# Patient Record
Sex: Female | Born: 1940
Health system: Southern US, Community
[De-identification: ages and names within clinical notes are randomized; demographics above are authoritative.]

## PROBLEM LIST (undated history)

## (undated) ENCOUNTER — Inpatient Hospital Stay: Admission: EM | Payer: Self-pay | Source: Home / Self Care

## (undated) DIAGNOSIS — I2510 Atherosclerotic heart disease of native coronary artery without angina pectoris: Secondary | ICD-10-CM

## (undated) DIAGNOSIS — T7840XA Allergy, unspecified, initial encounter: Secondary | ICD-10-CM

## (undated) DIAGNOSIS — K449 Diaphragmatic hernia without obstruction or gangrene: Secondary | ICD-10-CM

## (undated) DIAGNOSIS — R918 Other nonspecific abnormal finding of lung field: Secondary | ICD-10-CM

## (undated) DIAGNOSIS — E785 Hyperlipidemia, unspecified: Secondary | ICD-10-CM

## (undated) DIAGNOSIS — T380X5A Adverse effect of glucocorticoids and synthetic analogues, initial encounter: Secondary | ICD-10-CM

## (undated) DIAGNOSIS — N183 Chronic kidney disease, stage 3 unspecified: Secondary | ICD-10-CM

## (undated) DIAGNOSIS — I701 Atherosclerosis of renal artery: Secondary | ICD-10-CM

## (undated) DIAGNOSIS — C4491 Basal cell carcinoma of skin, unspecified: Secondary | ICD-10-CM

## (undated) DIAGNOSIS — I10 Essential (primary) hypertension: Secondary | ICD-10-CM

## (undated) DIAGNOSIS — R911 Solitary pulmonary nodule: Secondary | ICD-10-CM

## (undated) DIAGNOSIS — K219 Gastro-esophageal reflux disease without esophagitis: Secondary | ICD-10-CM

## (undated) DIAGNOSIS — I5030 Unspecified diastolic (congestive) heart failure: Secondary | ICD-10-CM

## (undated) DIAGNOSIS — C3490 Malignant neoplasm of unspecified part of unspecified bronchus or lung: Secondary | ICD-10-CM

## (undated) DIAGNOSIS — J189 Pneumonia, unspecified organism: Secondary | ICD-10-CM

## (undated) DIAGNOSIS — N289 Disorder of kidney and ureter, unspecified: Secondary | ICD-10-CM

## (undated) DIAGNOSIS — E039 Hypothyroidism, unspecified: Secondary | ICD-10-CM

## (undated) DIAGNOSIS — E099 Drug or chemical induced diabetes mellitus without complications: Secondary | ICD-10-CM

## (undated) DIAGNOSIS — M1990 Unspecified osteoarthritis, unspecified site: Secondary | ICD-10-CM

## (undated) HISTORY — PX: SKIN CANCER EXCISION: SHX779

## (undated) HISTORY — DX: Hyperlipidemia, unspecified: E78.5

## (undated) HISTORY — DX: Unspecified diastolic (congestive) heart failure: I50.30

## (undated) HISTORY — DX: Diaphragmatic hernia without obstruction or gangrene: K44.9

## (undated) HISTORY — DX: Allergy, unspecified, initial encounter: T78.40XA

## (undated) HISTORY — DX: Atherosclerosis of renal artery: I70.1

## (undated) HISTORY — DX: Essential (primary) hypertension: I10

## (undated) HISTORY — DX: Unspecified osteoarthritis, unspecified site: M19.90

## (undated) HISTORY — DX: Hypothyroidism, unspecified: E03.9

## (undated) HISTORY — DX: Chronic kidney disease, stage 3 unspecified: N18.30

## (undated) HISTORY — DX: Drug or chemical induced diabetes mellitus without complications: E09.9

## (undated) HISTORY — PX: TONSILLECTOMY AND ADENOIDECTOMY: SUR1326

## (undated) HISTORY — PX: CORONARY ANGIOGRAM: SHX5786

## (undated) HISTORY — DX: Gastro-esophageal reflux disease without esophagitis: K21.9

## (undated) HISTORY — DX: Chronic kidney disease, stage 3 (moderate): N18.3

## (undated) HISTORY — PX: CARDIAC CATHETERIZATION: SHX172

## (undated) HISTORY — DX: Atherosclerotic heart disease of native coronary artery without angina pectoris: I25.10

## (undated) HISTORY — PX: KNEE ARTHROSCOPY: SUR90

## (undated) HISTORY — DX: Adverse effect of glucocorticoids and synthetic analogues, initial encounter: T38.0X5A

## (undated) HISTORY — DX: Disorder of kidney and ureter, unspecified: N28.9

---

## 1959-07-06 HISTORY — PX: DILATION AND CURETTAGE OF UTERUS: SHX78

## 1968-11-04 HISTORY — PX: VAGINAL HYSTERECTOMY: SUR661

## 1969-07-05 HISTORY — PX: CHOLECYSTECTOMY: SHX55

## 1998-11-04 HISTORY — PX: ANTERIOR CERVICAL DECOMP/DISCECTOMY FUSION: SHX1161

## 2000-07-01 ENCOUNTER — Emergency Department (HOSPITAL_COMMUNITY): Admission: EM | Admit: 2000-07-01 | Discharge: 2000-07-01 | Payer: Self-pay | Admitting: Emergency Medicine

## 2000-07-01 ENCOUNTER — Encounter: Payer: Self-pay | Admitting: Family Medicine

## 2001-07-17 ENCOUNTER — Ambulatory Visit (HOSPITAL_COMMUNITY): Admission: RE | Admit: 2001-07-17 | Discharge: 2001-07-17 | Payer: Self-pay | Admitting: Neurological Surgery

## 2001-07-17 ENCOUNTER — Encounter: Payer: Self-pay | Admitting: Neurological Surgery

## 2002-03-27 ENCOUNTER — Inpatient Hospital Stay (HOSPITAL_COMMUNITY): Admission: EM | Admit: 2002-03-27 | Discharge: 2002-03-28 | Payer: Self-pay | Admitting: Emergency Medicine

## 2004-04-20 ENCOUNTER — Ambulatory Visit (HOSPITAL_COMMUNITY): Admission: RE | Admit: 2004-04-20 | Discharge: 2004-04-20 | Payer: Self-pay | Admitting: Gastroenterology

## 2005-04-18 ENCOUNTER — Inpatient Hospital Stay (HOSPITAL_BASED_OUTPATIENT_CLINIC_OR_DEPARTMENT_OTHER): Admission: RE | Admit: 2005-04-18 | Discharge: 2005-04-18 | Payer: Self-pay | Admitting: Cardiology

## 2008-01-28 ENCOUNTER — Encounter: Payer: Self-pay | Admitting: Family Medicine

## 2009-03-23 ENCOUNTER — Ambulatory Visit: Payer: Self-pay | Admitting: Family Medicine

## 2009-03-23 DIAGNOSIS — E039 Hypothyroidism, unspecified: Secondary | ICD-10-CM

## 2009-03-23 DIAGNOSIS — I10 Essential (primary) hypertension: Secondary | ICD-10-CM

## 2009-03-23 DIAGNOSIS — J45909 Unspecified asthma, uncomplicated: Secondary | ICD-10-CM | POA: Insufficient documentation

## 2009-03-23 DIAGNOSIS — Z860100 Personal history of colon polyps, unspecified: Secondary | ICD-10-CM | POA: Insufficient documentation

## 2009-03-23 DIAGNOSIS — K219 Gastro-esophageal reflux disease without esophagitis: Secondary | ICD-10-CM | POA: Insufficient documentation

## 2009-04-20 ENCOUNTER — Ambulatory Visit: Payer: Self-pay | Admitting: Family Medicine

## 2009-05-19 DIAGNOSIS — K259 Gastric ulcer, unspecified as acute or chronic, without hemorrhage or perforation: Secondary | ICD-10-CM | POA: Insufficient documentation

## 2009-05-19 DIAGNOSIS — K209 Esophagitis, unspecified without bleeding: Secondary | ICD-10-CM | POA: Insufficient documentation

## 2009-05-19 DIAGNOSIS — K449 Diaphragmatic hernia without obstruction or gangrene: Secondary | ICD-10-CM | POA: Insufficient documentation

## 2009-05-19 DIAGNOSIS — M170 Bilateral primary osteoarthritis of knee: Secondary | ICD-10-CM

## 2009-06-28 ENCOUNTER — Ambulatory Visit: Payer: Self-pay | Admitting: Family Medicine

## 2009-09-18 ENCOUNTER — Encounter (INDEPENDENT_AMBULATORY_CARE_PROVIDER_SITE_OTHER): Payer: Self-pay | Admitting: *Deleted

## 2009-09-18 ENCOUNTER — Ambulatory Visit: Payer: Self-pay | Admitting: Family Medicine

## 2009-09-18 DIAGNOSIS — L989 Disorder of the skin and subcutaneous tissue, unspecified: Secondary | ICD-10-CM | POA: Insufficient documentation

## 2009-09-20 LAB — CONVERTED CEMR LAB
BUN: 27 mg/dL — ABNORMAL HIGH (ref 6–23)
CO2: 30 meq/L (ref 19–32)
Calcium: 9.2 mg/dL (ref 8.4–10.5)
Chloride: 104 meq/L (ref 96–112)
Creatinine, Ser: 1 mg/dL (ref 0.4–1.2)
GFR calc non Af Amer: 58.11 mL/min (ref >60)
Glucose, Bld: 102 mg/dL — ABNORMAL HIGH (ref 70–99)
Potassium: 4.7 meq/L (ref 3.5–5.1)
Sodium: 143 meq/L (ref 135–145)
TSH: 1.83 microintl units/mL (ref 0.35–5.50)

## 2009-09-22 ENCOUNTER — Telehealth: Payer: Self-pay | Admitting: Family Medicine

## 2009-09-22 DIAGNOSIS — C4490 Unspecified malignant neoplasm of skin, unspecified: Secondary | ICD-10-CM

## 2009-11-10 ENCOUNTER — Ambulatory Visit: Payer: Self-pay | Admitting: Family Medicine

## 2009-11-10 DIAGNOSIS — M25569 Pain in unspecified knee: Secondary | ICD-10-CM

## 2009-12-06 ENCOUNTER — Encounter: Payer: Self-pay | Admitting: Family Medicine

## 2009-12-06 LAB — HM MAMMOGRAPHY: HM Mammogram: NORMAL

## 2009-12-08 ENCOUNTER — Encounter: Payer: Self-pay | Admitting: Family Medicine

## 2010-01-15 ENCOUNTER — Ambulatory Visit: Payer: Self-pay | Admitting: Family Medicine

## 2010-01-15 DIAGNOSIS — M810 Age-related osteoporosis without current pathological fracture: Secondary | ICD-10-CM | POA: Insufficient documentation

## 2010-02-12 ENCOUNTER — Ambulatory Visit: Payer: Self-pay | Admitting: Family Medicine

## 2010-06-04 ENCOUNTER — Ambulatory Visit: Payer: Self-pay | Admitting: Family Medicine

## 2010-06-04 DIAGNOSIS — R609 Edema, unspecified: Secondary | ICD-10-CM | POA: Insufficient documentation

## 2010-06-29 ENCOUNTER — Ambulatory Visit: Payer: Self-pay | Admitting: Family Medicine

## 2010-07-30 ENCOUNTER — Encounter: Payer: Self-pay | Admitting: Family Medicine

## 2010-11-16 ENCOUNTER — Telehealth (INDEPENDENT_AMBULATORY_CARE_PROVIDER_SITE_OTHER): Payer: Self-pay | Admitting: *Deleted

## 2010-12-02 LAB — CONVERTED CEMR LAB
ALT: 19 units/L (ref 0–35)
AST: 21 units/L (ref 0–37)
Albumin: 4 g/dL (ref 3.5–5.2)
Alkaline Phosphatase: 101 units/L (ref 39–117)
BUN: 21 mg/dL (ref 6–23)
Basophils Absolute: 0 10*3/uL (ref 0.0–0.1)
Basophils Relative: 0.6 % (ref 0.0–3.0)
Bilirubin, Direct: 0.2 mg/dL (ref 0.0–0.3)
CO2: 28 meq/L (ref 19–32)
Calcium: 9 mg/dL (ref 8.4–10.5)
Chloride: 108 meq/L (ref 96–112)
Cholesterol: 195 mg/dL (ref 0–200)
Creatinine, Ser: 1 mg/dL (ref 0.4–1.2)
Eosinophils Absolute: 0.3 10*3/uL (ref 0.0–0.7)
Eosinophils Relative: 3.6 % (ref 0.0–5.0)
GFR calc non Af Amer: 57.99 mL/min (ref >60)
Glucose, Bld: 99 mg/dL (ref 70–99)
HCT: 40.8 % (ref 36.0–46.0)
HDL: 53.1 mg/dL (ref >39.00)
Hemoglobin: 13.8 g/dL (ref 12.0–15.0)
LDL Cholesterol: 114 mg/dL — ABNORMAL HIGH (ref 0–99)
Lymphocytes Relative: 24.5 % (ref 12.0–46.0)
Lymphs Abs: 1.9 10*3/uL (ref 0.7–4.0)
MCHC: 33.7 g/dL (ref 30.0–36.0)
MCV: 87.1 fL (ref 78.0–100.0)
Monocytes Absolute: 0.4 10*3/uL (ref 0.1–1.0)
Monocytes Relative: 5.3 % (ref 3.0–12.0)
Neutro Abs: 5.1 10*3/uL (ref 1.4–7.7)
Neutrophils Relative %: 66 % (ref 43.0–77.0)
Platelets: 267 10*3/uL (ref 150.0–400.0)
Potassium: 4.4 meq/L (ref 3.5–5.1)
RBC: 4.69 M/uL (ref 3.87–5.11)
RDW: 15.6 % — ABNORMAL HIGH (ref 11.5–14.6)
Sodium: 144 meq/L (ref 135–145)
TSH: 2.1 microintl units/mL (ref 0.35–5.50)
Total Bilirubin: 0.9 mg/dL (ref 0.3–1.2)
Total CHOL/HDL Ratio: 4
Total Protein: 6.9 g/dL (ref 6.0–8.3)
Triglycerides: 141 mg/dL (ref 0.0–149.0)
VLDL: 28.2 mg/dL (ref 0.0–40.0)
Vit D, 25-Hydroxy: 35 ng/mL (ref 30–89)
WBC: 7.7 10*3/uL (ref 4.5–10.5)

## 2010-12-06 NOTE — Assessment & Plan Note (Signed)
Summary: CPX/FASTING//KN   Vital Signs:  Patient profile:   70 year old female Height:      63.50 inches Weight:      250 pounds BMI:     43.75 Pulse rate:   62 / minute BP sitting:   136 / 80  (left arm)  Vitals Entered By: Doristine Devoid CMA (June 29, 2010 8:38 AM) CC: CPX AND LABS    History of Present Illness: 70 yo woman here today for CPE.  Here for Medicare AWV:  1.   Risk factors based on Past M, S, F history: HTN- BP adequately controlled on Amlodipine, Losartan, Carvedilol.  no CP, SOB, HAs, visual changes.  + edema Hypothyroid- on Levothroid, denies heat/cold intolerance, alopecia, brittle nails GERD- on Prevacid.  sxs well controlled. Edema- out of town for the last 3 days, had pizza and multiple meals out.  improved w/ elevation and lasix 2.   Physical Activities: walking regularly 3.   Depression/mood: deniess depressive sxs 4.   Hearing: normal to whispered voice 5.   ADL's: independent 6.   Fall Risk: denies concern, no risk identified in office 7.   Home Safety: safe at home, husband present 8.   Height, weight, &visual acuity: see vitals, corrected to 20/20 w/ glasses 9.   Counseling: provided on healthy diet and regular exerise 10.   Labs ordered based on risk factors: see A&P 11.           Referral Coordination: UTD on colonoscopy, mammogram, bone density.  no pap in 3-4 yrs- s/p hysterectomy.  Ovaries remain. 12.           Care Plan:  see A&P 13.           Cognitive Assessment- normal thought process, conversation.  able to pay bills and balance checkbook w/out assistance.  AAOx3, 3/3 object recall, W-O-R-L-D  Preventive Screening-Counseling & Management  Alcohol-Tobacco     Alcohol drinks/day: <1     Smoking Status: never  Caffeine-Diet-Exercise     Does Patient Exercise: yes     Type of exercise: walking, water aerobics      Sexual History:  currently monogamous.        Drug Use:  never.    Current Medications (verified): 1)  Amlodipine  Besylate 10 Mg Tabs (Amlodipine Besylate) .... One By Mouth Once Daily 2)  Carvedilol 12.5 Mg Tabs (Carvedilol) .... One By Mouth Two Times A Day 3)  Levothroid 75 Mcg Tabs (Levothyroxine Sodium) .... Once Daily 4)  Singulair 10 Mg Tabs (Montelukast Sodium) .... As Needed 5)  Furosemide 20 Mg Tabs (Furosemide) .... Once Daily 6)  Losartan Potassium 100 Mg Tabs (Losartan Potassium) .... One By Mouth Once Daily 7)  Prevacid Solutab 15 Mg Tbdp (Lansoprazole) .... Once Daily 8)  Fluticasone Propionate 50 Mcg/act Susp (Fluticasone Propionate) .... Once Daily 9)  Zyrtec Allergy 10 Mg Tabs (Cetirizine Hcl) .... Once Daily 10)  Multi For Her 50+  Tabs (Multiple Vitamins-Minerals) .... Once Daily 11)  Advair Diskus 100-50 Mcg/dose Misc (Fluticasone-Salmeterol) .... One Puff in Am and Pm 12)  Calcium 600-200 Mg-Unit Tabs (Calcium-Vitamin D) .... Take One Tablet Daily 13)  Etodolac 400 Mg Tabs (Etodolac) .... 2 Tablets in Am and Pm  Allergies (verified): 1)  ! Sulphur-Heel (Homeopathic Products) 2)  ! Claritin (Loratadine) 3)  ! Celebrex (Celecoxib) 4)  ! Clinoril (Sulindac) 5)  ! Lisinopril (Lisinopril) 6)  ! Lotensin (Benazepril Hcl)  Past History:  Past Medical History: Last  updated: 06/28/2009 Asthma Colonic polyps, hx of GERD Hypertension Hay fever/Allergies Arthritis Hypothyroid Osteoporosis DJD gastric ulcer esophagitis hiatal hernia  Past Surgical History: Last updated: 06/04/2010 Rt knee arthroscopy 1995 Left knee  "       "       2003 Neck surgery 2000 Cholecystectomy  1978 Hysterectomy  1970--bleeding Child birth x 3 fusion C5-C6  2000  Family History: Family History of Arthritis Family History High cholesterol Family History Hypertension Diabetes, parent, grandparent Stroke parent, grandparent Heart disease, parent, grandparent sister w/ breast cancer colon cancer- no  Social History: Retired- owned a Physicist, medical Married Never Smoked Alcohol  use-no Does Patient Exercise:  yes Sexual History:  currently monogamous Drug Use:  never  Review of Systems       The patient complains of peripheral edema.  The patient denies anorexia, fever, weight loss, weight gain, vision loss, decreased hearing, hoarseness, chest pain, syncope, dyspnea on exertion, prolonged cough, headaches, abdominal pain, melena, hematochezia, severe indigestion/heartburn, hematuria, suspicious skin lesions, depression, abnormal bleeding, enlarged lymph nodes, and breast masses.    Physical Exam  General:  Well-developed,well-nourished,in no acute distress; alert,appropriate and cooperative throughout examination Head:  Normocephalic and atraumatic without obvious abnormalities. No apparent alopecia or balding. Eyes:  No corneal or conjunctival inflammation noted. EOMI. Perrla. Funduscopic exam benign, without hemorrhages, exudates or papilledema. Vision grossly normal. Ears:  External ear exam shows no significant lesions or deformities.  Otoscopic examination reveals clear canals, tympanic membranes are intact bilaterally without bulging, retraction, inflammation or discharge. Hearing is grossly normal bilaterally. Nose:  External nasal examination shows no deformity or inflammation. Nasal mucosa are pink and moist without lesions or exudates. Mouth:  Oral mucosa and oropharynx without lesions or exudates.  Teeth in good repair. Neck:  No deformities, masses, or tenderness noted. Breasts:  No mass, nodules, thickening, tenderness, bulging, retraction, inflamation, nipple discharge or skin changes noted.   Lungs:  Normal respiratory effort, chest expands symmetrically. Lungs are clear to auscultation, no crackles or wheezes. Heart:  Normal rate and regular rhythm. S1 and S2 normal without gallop, murmur, click, rub or other extra sounds. Abdomen:  Bowel sounds positive,abdomen soft and non-tender without masses, organomegaly or hernias noted. Genitalia:  normal  introitus, no external lesions, no vaginal discharge, mucosa pink and moist, and no adnexal masses or tenderness.   Pulses:  +2 carotid, radial, DP/PT Extremities:  No clubbing, cyanosis, edema, or deformity noted with normal full range of motion of all joints.   Neurologic:  No cranial nerve deficits noted. Station and gait are normal. Plantar reflexes are down-going bilaterally. DTRs are symmetrical throughout. Sensory, motor and coordinative functions appear intact. Skin:  Intact without suspicious lesions or rashes Cervical Nodes:  No lymphadenopathy noted Axillary Nodes:  No palpable lymphadenopathy Psych:  Cognition and judgment appear intact. Alert and cooperative with normal attention span and concentration. No apparent delusions, illusions, hallucinations   Impression & Recommendations:  Problem # 1:  PHYSICAL EXAMINATION (ICD-V70.0) Assessment New pt's PE WNL.  check labs.  UTD on health maintainence.  anticipatory guidance provided. Orders: Specimen Handling (44034) TLB-Lipid Panel (80061-LIPID) TLB-Hepatic/Liver Function Pnl (80076-HEPATIC) Medicare -1st Annual Wellness Visit [phone])  Problem # 2:  HYPERTENSION (ICD-401.9) Assessment: Unchanged adequate control on current meds.  asymptomatic.  no changes Her updated medication list for this problem includes:    Amlodipine Besylate 10 Mg Tabs (Amlodipine besylate) ..... One by mouth once daily    Carvedilol 12.5 Mg Tabs (Carvedilol) ..... One by mouth  two times a day    Furosemide 20 Mg Tabs (Furosemide) ..... Once daily    Losartan Potassium 100 Mg Tabs (Losartan potassium) ..... One by mouth once daily  Orders: TLB-BMP (Basic Metabolic Panel-BMET) (80048-METABOL) TLB-CBC Platelet - w/Differential (85025-CBCD)  Problem # 3:  HYPOTHYROIDISM (ICD-244.9) Assessment: Unchanged asymptomatic.  check labs.  adjust meds as needed. Her updated medication list for this problem includes:    Levothroid 75 Mcg Tabs (Levothyroxine  sodium) ..... Once daily  Orders: Venipuncture (16109) Specimen Handling (60454) TLB-TSH (Thyroid Stimulating Hormone) (84443-TSH)  Problem # 4:  GERD (ICD-530.81) Assessment: Unchanged sxs adquately controlled. Her updated medication list for this problem includes:    Prevacid Solutab 15 Mg Tbdp (Lansoprazole) ..... Once daily  Problem # 5:  EDEMA- LOCALIZED (ICD-782.3) Assessment: Unchanged again reviewed the importance of low sodium diet, especially when eating out/traveling. Her updated medication list for this problem includes:    Furosemide 20 Mg Tabs (Furosemide) ..... Once daily  Complete Medication List: 1)  Amlodipine Besylate 10 Mg Tabs (Amlodipine besylate) .... One by mouth once daily 2)  Carvedilol 12.5 Mg Tabs (Carvedilol) .... One by mouth two times a day 3)  Levothroid 75 Mcg Tabs (Levothyroxine sodium) .... Once daily 4)  Singulair 10 Mg Tabs (Montelukast sodium) .... As needed 5)  Furosemide 20 Mg Tabs (Furosemide) .... Once daily 6)  Losartan Potassium 100 Mg Tabs (Losartan potassium) .... One by mouth once daily 7)  Prevacid Solutab 15 Mg Tbdp (Lansoprazole) .... Once daily 8)  Fluticasone Propionate 50 Mcg/act Susp (Fluticasone propionate) .... Once daily 9)  Zyrtec Allergy 10 Mg Tabs (Cetirizine hcl) .... Once daily 10)  Multi For Her 50+ Tabs (Multiple vitamins-minerals) .... Once daily 11)  Advair Diskus 100-50 Mcg/dose Misc (Fluticasone-salmeterol) .... One puff in am and pm 12)  Calcium 600-200 Mg-unit Tabs (Calcium-vitamin d) .... Take one tablet daily 13)  Etodolac 400 Mg Tabs (Etodolac) .... 2 tablets in am and pm  Other Orders: T-Vitamin D (25-Hydroxy) [id number]) Prescription Created Electronically [id number])  Patient Instructions: 1)  Follow up in 6 months- sooner if needed 2)  We'll notify you of your lab results 3)  Call with any questions or concerns 4)  Keep up the good work on exercise and continue to make healthy food choices 5)  Your  exam looks great! 6)  Happy Labor Day! Prescriptions: ETODOLAC 400 MG TABS (ETODOLAC) 2 tablets in am and pm  #120 x 3   Entered and Authorized by:   Neena Rhymes MD   Signed by:   Neena Rhymes MD on 06/29/2010   Method used:   Electronically to        [facility]* (retail)       [address] [place], [place]  [address]       Ph: [phone]       Fax: [phone]   RxID:   [id number] ADVAIR DISKUS 100-50 MCG/DOSE MISC (FLUTICASONE-SALMETEROL) one puff in am and pm  #3 x 3   Entered and Authorized by:   Neena Rhymes MD   Signed by:   Neena Rhymes MD on 06/29/2010   Method used:   Electronically to        [facility]* (retail)       [address] [place], [place]  [address]  Ph: [phone]       Fax: [phone]   RxID:   [id number] FLUTICASONE PROPIONATE 50 MCG/ACT SUSP (FLUTICASONE PROPIONATE) once daily  #3 x 3   Entered and Authorized by:   Neena Rhymes MD   Signed by:   Neena Rhymes MD on 06/29/2010   Method used:   Electronically to        [facility]* (retail)       [address] [place], [place]  [address]       Ph: [phone]       Fax: [phone]   RxID:   [id number]

## 2010-12-06 NOTE — Assessment & Plan Note (Signed)
Summary: new to estab/cbs   Vital Signs:  Patient profile:   70 year old female Weight:      247 pounds Pulse rate:   68 / minute BP sitting:   120 / 78  (left arm)  Vitals Entered By: Doristine Devoid CMA (June 04, 2010 10:32 AM) CC: new est- problems w/ arthritis med and feet swelling while on vacation   History of Present Illness: 70 yo woman here today to establish care.  previously a pt of Dr Caryl Never.  'it was time for a change'.  1) Arthritis- c/o hands, knees, feet, back.  saw Dr Eulah Pont this winter.  there has been talk of knee replacement.  currently on Tramadol- taking 2 in the AM provides relief of back pain.  has had knee injections in the past.  is considering SynVisc.  no longer on the Etodolac b/c of elevated BP.    2) Edema- occurred while traveling.  'they were swollen the whole time'.  hx of similar while traveling.  resolves when she returns home.  often eats at restaurants while traveling plus air travel.  3) HTN- well controlled on current meds.  checks BPs at home w/ results similar to those in office.  on Carvedilol, amlodipine, losartan, and Furosemide.  no CP, SOB, HAs, visual changes.  4) Hypothyroid- maintainted on Levothyroxine.  last labs in 11/10.  5) GERD- taking Prevacid w/ good sxs relief.  also modifying diet.  Problems Prior to Update: 1)  Osteopenia  (ICD-733.90) 2)  Knee Pain  (ICD-719.46) 3)  Carcinoma, Basal Cell  (ICD-173.9) 4)  Skin Lesion  (ICD-709.9) 5)  Hiatal Hernia  (ICD-553.3) 6)  Esophagitis  (ICD-530.10) 7)  Gastric Ulcer  (ICD-531.90) 8)  Degenerative Joint Disease  (ICD-715.90) 9)  Hypothyroidism  (ICD-244.9) 10)  Hypertension  (ICD-401.9) 11)  Gerd  (ICD-530.81) 12)  Colonic Polyps, Hx of  (ICD-V12.72) 13)  Asthma  (ICD-493.90)  Current Medications (verified): 1)  Amlodipine Besylate 10 Mg Tabs (Amlodipine Besylate) .... One By Mouth Once Daily 2)  Carvedilol 12.5 Mg Tabs (Carvedilol) .... One By Mouth Two Times A Day 3)   Levothroid 75 Mcg Tabs (Levothyroxine Sodium) .... Once Daily 4)  Singulair 10 Mg Tabs (Montelukast Sodium) .... As Needed 5)  Furosemide 20 Mg Tabs (Furosemide) .... Once Daily 6)  Losartan Potassium 100 Mg Tabs (Losartan Potassium) .... One By Mouth Once Daily 7)  Prevacid Solutab 15 Mg Tbdp (Lansoprazole) .... Once Daily 8)  Fluticasone Propionate 50 Mcg/act Susp (Fluticasone Propionate) .... Once Daily 9)  Zyrtec Allergy 10 Mg Tabs (Cetirizine Hcl) .... Once Daily 10)  Multi For Her 50+  Tabs (Multiple Vitamins-Minerals) .... Once Daily 11)  Advair Diskus 100-50 Mcg/dose Misc (Fluticasone-Salmeterol) .... One Puff in Am and Pm 12)  Adult Aspirin Ec Low Strength 81 Mg Tbec (Aspirin) .... Once Daily 13)  Tramadol Hcl 50 Mg Tabs (Tramadol Hcl) .... Take 2 Tablets in Am and 1 Tablet in Pm 14)  Calcium 600-200 Mg-Unit Tabs (Calcium-Vitamin D) .... Take One Tablet Daily  Allergies (verified): 1)  ! Sulphur-Heel (Homeopathic Products) 2)  ! Claritin (Loratadine) 3)  ! Celebrex (Celecoxib) 4)  ! Clinoril (Sulindac) 5)  ! Lisinopril (Lisinopril) 6)  ! Lotensin (Benazepril Hcl)  Past History:  Past Medical History: Reviewed history from 06/28/2009 and no changes required. Asthma Colonic polyps, hx of GERD Hypertension Hay fever/Allergies Arthritis Hypothyroid Osteoporosis DJD gastric ulcer esophagitis hiatal hernia  Past Surgical History: Rt knee arthroscopy 1995 Left knee  "       "  2003 Neck surgery 2000 Cholecystectomy  1978 Hysterectomy  1970--bleeding Child birth x 3 fusion C5-C6  2000  Review of Systems      See HPI  Physical Exam  General:  Well-developed,well-nourished,in no acute distress; alert,appropriate and cooperative throughout examination Head:  Normocephalic and atraumatic without obvious abnormalities. No apparent alopecia or balding. Neck:  No deformities, masses, or tenderness noted. Lungs:  Normal respiratory effort, chest expands  symmetrically. Lungs are clear to auscultation, no crackles or wheezes. Heart:  Normal rate and regular rhythm. S1 and S2 normal without gallop, murmur, click, rub or other extra sounds. Abdomen:  soft, NT/ND, +BS Msk:  + TTP along knee joint line bilaterally + crepitus w/ flexion/extension bilaterally Pulses:  +2 DP/PT Extremities:  no C/C/E   Impression & Recommendations:  Problem # 1:  DEGENERATIVE JOINT DISEASE (ICD-715.90) Assessment Unchanged pt w/ increased pain. felt Etodolac worked better than Tramadol.  will restart NSAIDs.  pt to call ortho and discuss possible knee injxn w/ Dr Eulah Pont.  stop Ultram. The following medications were removed from the medication list:    Etodolac 400 Mg Tabs (Etodolac) ..... One by mouth two times a day    Hydrocodone-acetaminophen 5-325 Mg Tabs (Hydrocodone-acetaminophen) .Marland Kitchen... 1-2 by mouth q 4-6 hours as needed pain Her updated medication list for this problem includes:    Adult Aspirin Ec Low Strength 81 Mg Tbec (Aspirin) ..... Once daily    Tramadol Hcl 50 Mg Tabs (Tramadol hcl) .Marland Kitchen... Take 2 tablets in am and 1 tablet in pm  Problem # 2:  EDEMA- LOCALIZED (ICD-782.3) Assessment: New resolved today.  reviewed importance of low sodium diet, especially while traveling.  reviewed supportive care. Her updated medication list for this problem includes:    Furosemide 20 Mg Tabs (Furosemide) ..... Once daily  Problem # 3:  HYPERTENSION (ICD-401.9) Assessment: Unchanged well controlled.  aysmptomatic.  cont current meds. Her updated medication list for this problem includes:    Amlodipine Besylate 10 Mg Tabs (Amlodipine besylate) ..... One by mouth once daily    Carvedilol 12.5 Mg Tabs (Carvedilol) ..... One by mouth two times a day    Furosemide 20 Mg Tabs (Furosemide) ..... Once daily    Losartan Potassium 100 Mg Tabs (Losartan potassium) ..... One by mouth once daily  Problem # 4:  HYPOTHYROIDISM (ICD-244.9) Assessment: Unchanged due for  labs this fall.  will follow. Her updated medication list for this problem includes:    Levothroid 75 Mcg Tabs (Levothyroxine sodium) ..... Once daily  Problem # 5:  GERD (ICD-530.81) Assessment: Unchanged well controlled on prevacid and dietary modifications. Her updated medication list for this problem includes:    Prevacid Solutab 15 Mg Tbdp (Lansoprazole) ..... Once daily  Complete Medication List: 1)  Amlodipine Besylate 10 Mg Tabs (Amlodipine besylate) .... One by mouth once daily 2)  Carvedilol 12.5 Mg Tabs (Carvedilol) .... One by mouth two times a day 3)  Levothroid 75 Mcg Tabs (Levothyroxine sodium) .... Once daily 4)  Singulair 10 Mg Tabs (Montelukast sodium) .... As needed 5)  Furosemide 20 Mg Tabs (Furosemide) .... Once daily 6)  Losartan Potassium 100 Mg Tabs (Losartan potassium) .... One by mouth once daily 7)  Prevacid Solutab 15 Mg Tbdp (Lansoprazole) .... Once daily 8)  Fluticasone Propionate 50 Mcg/act Susp (Fluticasone propionate) .... Once daily 9)  Zyrtec Allergy 10 Mg Tabs (Cetirizine hcl) .... Once daily 10)  Multi For Her 50+ Tabs (Multiple vitamins-minerals) .... Once daily 11)  Advair Diskus 100-50 Mcg/dose  Misc (Fluticasone-salmeterol) .... One puff in am and pm 12)  Adult Aspirin Ec Low Strength 81 Mg Tbec (Aspirin) .... Once daily 13)  Tramadol Hcl 50 Mg Tabs (Tramadol hcl) .... Take 2 tablets in am and 1 tablet in pm 14)  Calcium 600-200 Mg-unit Tabs (Calcium-vitamin d) .... Take one tablet daily  Patient Instructions: 1)  Please schedule your physical in the next 3-4 weeks, don't eat before this appt 2)  STOP the tramadol 3)  RESTART the Etodolac two times a day- take w/ food to avoid upset stomach 4)  Double the furosemide dose as needed for swelling 5)  Elevate your legs as much as possible and limit your salt intake 6)  Call Dr Eulah Pont to discuss your knee injections 7)  Call with any questions or concerns 8)  Welcome!  We're glad to have you!

## 2010-12-06 NOTE — Assessment & Plan Note (Signed)
Summary: left knee pain/can't put any weight on it/cjr   Vital Signs:  Patient profile:   70 year old female Temp:     98.4 degrees F BP sitting:   162 / 90  (left arm) Cuff size:   large  Vitals Entered By: Sid Falcon LPN (November 10, 2009 3:00 PM) CC: left knee pain, no known injury Pain Assessment Patient in pain? yes     Location: knee Intensity: 10 Type: sharp Onset of pain  intensified yesterday, when walking #10 pain   History of Present Illness: Acute visit for left knee pain. Started yesterday. No known injury. Does have known degenerative joint disease. Takes etodolac 400 mg twice daily on a regular basis. Remote history of arthroscopy knee several years ago. Describes sharp pain mostly medial aspect left knee occasionally radiates posterior. Mild swelling. Increased night pain. No relief with current medications. Exacerbated with bending the knee and weightbearing. No warmth or erythema.  Allergies: 1)  ! Sulphur-Heel (Homeopathic Products) 2)  ! Claritin (Loratadine) 3)  ! Celebrex (Celecoxib) 4)  ! Clinoril (Sulindac) 5)  ! Lisinopril (Lisinopril) 6)  ! Lotensin (Benazepril Hcl)  Past History:  Past Medical History: Last updated: 06/28/2009 Asthma Colonic polyps, hx of GERD Hypertension Hay fever/Allergies Arthritis Hypothyroid Osteoporosis DJD gastric ulcer esophagitis hiatal hernia  Review of Systems      See HPI  Physical Exam  General:  Well-developed,well-nourished,in no acute distress; alert,appropriate and cooperative throughout examination Extremities:  left knee refills no warmth or erythema compared to right. No obvious effusion. Medial joint line tenderness. No lateral joint line tenderness. No popliteal swelling. Full range of motion knee. Cruciate ligament and collateral ligament testing is stable.   Impression & Recommendations:  Problem # 1:  KNEE PAIN (ICD-719.46) known degenerative disease. Question meniscus origin of  current pain. Add pain medication for symptom relief and continue anti-inflammatory. Consider x-rays the next week if no better. Her updated medication list for this problem includes:    Etodolac 400 Mg Tabs (Etodolac) ..... One by mouth two times a day    Adult Aspirin Ec Low Strength 81 Mg Tbec (Aspirin) ..... Once daily    Hydrocodone-acetaminophen 5-325 Mg Tabs (Hydrocodone-acetaminophen) .Marland Kitchen... 1-2 by mouth q 4-6 hours as needed pain  Complete Medication List: 1)  Etodolac 400 Mg Tabs (Etodolac) .... One by mouth two times a day 2)  Amlodipine Besylate 5 Mg Tabs (Amlodipine besylate) .... Once daily 3)  Carvedilol 12.5 Mg Tabs (Carvedilol) .... One by mouth two times a day 4)  Levothroid 75 Mcg Tabs (Levothyroxine sodium) .... Once daily 5)  Singulair 10 Mg Tabs (Montelukast sodium) .... As needed 6)  Furosemide 20 Mg Tabs (Furosemide) .... Once daily 7)  Benicar 40 Mg Tabs (Olmesartan medoxomil) .... One by mouth once daily 8)  Prevacid Solutab 15 Mg Tbdp (Lansoprazole) .... Once daily 9)  Fluticasone Propionate 50 Mcg/act Susp (Fluticasone propionate) .... Once daily 10)  Zyrtec Allergy 10 Mg Tabs (Cetirizine hcl) .... Once daily 11)  Multi For Her 50+ Tabs (Multiple vitamins-minerals) .... Once daily 12)  Advair Diskus 100-50 Mcg/dose Misc (Fluticasone-salmeterol) .... One puff in am and pm 13)  Adult Aspirin Ec Low Strength 81 Mg Tbec (Aspirin) .... Once daily 14)  Hydrocodone-acetaminophen 5-325 Mg Tabs (Hydrocodone-acetaminophen) .Marland Kitchen.. 1-2 by mouth q 4-6 hours as needed pain  Patient Instructions: 1)  Continue ice for symptomatic relief. 2)  Be in touch by next week if pain not improved. Prescriptions: HYDROCODONE-ACETAMINOPHEN 5-325 MG  TABS (HYDROCODONE-ACETAMINOPHEN) 1-2 by mouth q 4-6 hours as needed pain  #40 x 0   Entered and Authorized by:   Evelena Peat MD   Signed by:   Evelena Peat MD on 11/10/2009   Method used:   Print then Give to Patient   RxID:    [id number]

## 2010-12-06 NOTE — Assessment & Plan Note (Signed)
Summary: 1 month rov/njr   Vital Signs:  Patient profile:   70 year old female Weight:      253 pounds Temp:     98.2 degrees F oral BP sitting:   160 / 88  (left arm) Cuff size:   large  Vitals Entered By: Sid Falcon LPN (February 12, 2010 1:18 PM)  Serial Vital Signs/Assessments:  Time      Position  BP       Pulse  Resp  Temp     By                     150/78                         Evelena Peat MD  CC: 1 month follow-up   History of Present Illness: Followup hypertension. Increased amlodipine to 10 mg daily last visit. Initially some lightheadedness but those symptoms have resolved. Denies any palpitations or chest pains. No orthostatic symptoms now. compliant with all medications.  log of home blood pressure readings reviewed and she has significant improvement since blood pressure meds adjusted  Allergies: 1)  ! Sulphur-Heel (Homeopathic Products) 2)  ! Claritin (Loratadine) 3)  ! Celebrex (Celecoxib) 4)  ! Clinoril (Sulindac) 5)  ! Lisinopril (Lisinopril) 6)  ! Lotensin (Benazepril Hcl)  Past History:  Past Medical History: Last updated: 06/28/2009 Asthma Colonic polyps, hx of GERD Hypertension Hay fever/Allergies Arthritis Hypothyroid Osteoporosis DJD gastric ulcer esophagitis hiatal hernia  Review of Systems  The patient denies weight loss, weight gain, chest pain, syncope, dyspnea on exertion, prolonged cough, headaches, and abdominal pain.         mild to moderate peripheral edema which she attributes to recent trip and increased walking  Physical Exam  General:  Well-developed,well-nourished,in no acute distress; alert,appropriate and cooperative throughout examination Mouth:  Oral mucosa and oropharynx without lesions or exudates.  Teeth in good repair. Neck:  No deformities, masses, or tenderness noted. Lungs:  Normal respiratory effort, chest expands symmetrically. Lungs are clear to auscultation, no crackles or wheezes. Heart:  Normal  rate and regular rhythm. S1 and S2 normal without gallop, murmur, click, rub or other extra sounds. Extremities:  trace edema lower legs bilaterally   Impression & Recommendations:  Problem # 1:  HYPERTENSION (ICD-401.9) Assessment Improved continue weight loss efforts. Followup 3 months Her updated medication list for this problem includes:    Amlodipine Besylate 10 Mg Tabs (Amlodipine besylate) ..... One by mouth once daily    Carvedilol 12.5 Mg Tabs (Carvedilol) ..... One by mouth two times a day    Furosemide 20 Mg Tabs (Furosemide) ..... Once daily    Losartan Potassium 100 Mg Tabs (Losartan potassium) ..... One by mouth once daily  Complete Medication List: 1)  Etodolac 400 Mg Tabs (Etodolac) .... One by mouth two times a day 2)  Amlodipine Besylate 10 Mg Tabs (Amlodipine besylate) .... One by mouth once daily 3)  Carvedilol 12.5 Mg Tabs (Carvedilol) .... One by mouth two times a day 4)  Levothroid 75 Mcg Tabs (Levothyroxine sodium) .... Once daily 5)  Singulair 10 Mg Tabs (Montelukast sodium) .... As needed 6)  Furosemide 20 Mg Tabs (Furosemide) .... Once daily 7)  Losartan Potassium 100 Mg Tabs (Losartan potassium) .... One by mouth once daily 8)  Prevacid Solutab 15 Mg Tbdp (Lansoprazole) .... Once daily 9)  Fluticasone Propionate 50 Mcg/act Susp (Fluticasone propionate) .... Once daily 10)  Zyrtec Allergy 10 Mg Tabs (Cetirizine hcl) .... Once daily 11)  Multi For Her 50+ Tabs (Multiple vitamins-minerals) .... Once daily 12)  Advair Diskus 100-50 Mcg/dose Misc (Fluticasone-salmeterol) .... One puff in am and pm 13)  Adult Aspirin Ec Low Strength 81 Mg Tbec (Aspirin) .... Once daily 14)  Hydrocodone-acetaminophen 5-325 Mg Tabs (Hydrocodone-acetaminophen) .Marland Kitchen.. 1-2 by mouth q 4-6 hours as needed pain 15)  Tramadol Hcl 50 Mg Tabs (Tramadol hcl) .Marland Kitchen.. 1-2 by mouth q 6 hours as needed pain  Patient Instructions: 1)  Please schedule a follow-up appointment in 3 months .  2)  You  need to lose weight. Consider a lower calorie diet and regular exercise.  Prescriptions: AMLODIPINE BESYLATE 10 MG TABS (AMLODIPINE BESYLATE) one by mouth once daily  #90 x 3   Entered and Authorized by:   Evelena Peat MD   Signed by:   Evelena Peat MD on 02/12/2010   Method used:   Electronically to        [facility]* (retail)       [address] [place], [place]  [address]       Ph: [phone]       Fax: [phone]   RxID:   [id number]

## 2010-12-06 NOTE — Assessment & Plan Note (Signed)
Summary: 4 month rov/njr   Vital Signs:  Patient profile:   70 year old female Height:      63.50 inches Weight:      251 pounds BMI:     43.92 Temp:     98.7 degrees F oral BP sitting:   160 / 80  (left arm) Cuff size:   large  Vitals Entered By: Sid Falcon LPN (January 15, 2010 1:28 PM)  Nutrition Counseling: Patient's BMI is greater than 25 and therefore counseled on weight management options. CC: 4 month follow-up   History of Present Illness: Patient her for followup several items as follows.  Recent increasing knee pains left greater than right. Orthopedist last week and had fluid drained off. Also cortisone injection but thus far no improvement. Looking at possible Synvisc injections. She has not yet decided if she wishes to go through with that. She is exercising regularly with water aerobics. Using etodolac but poor pain control at times.  Hypertension treated with Benicar, furosemide, amlodipine, and carvedilol. Recent poor blood pressure control. Denies any headaches or dizziness. Also wishes to change from Benicar to generic for cost reasons.  History of osteopenia. Recent DEXA scan. Results were reviewed with patient. Mild osteopenia at hip normal spine density. Patient using calcium and vitamin D regularly.    Allergies: 1)  ! Sulphur-Heel (Homeopathic Products) 2)  ! Claritin (Loratadine) 3)  ! Celebrex (Celecoxib) 4)  ! Clinoril (Sulindac) 5)  ! Lisinopril (Lisinopril) 6)  ! Lotensin (Benazepril Hcl)  Past History:  Past Medical History: Last updated: 06/28/2009 Asthma Colonic polyps, hx of GERD Hypertension Hay fever/Allergies Arthritis Hypothyroid Osteoporosis DJD gastric ulcer esophagitis hiatal hernia  Past Surgical History: Last updated: 05/19/2009 Rt knee arthroscopy 1995 Left knee  "       "       2003 Neck surgery 2000 Cholecystectomy  1978 Hysterectomy  1970--bleeding CB x 3 fusion C5-C6  2000  Social History: Last updated:  03/23/2009 Retired Married Never Smoked Alcohol use-no PMH reviewed for relevance, PSH reviewed for relevance  Review of Systems  The patient denies anorexia, fever, weight loss, weight gain, chest pain, syncope, dyspnea on exertion, peripheral edema, prolonged cough, headaches, hemoptysis, abdominal pain, melena, hematochezia, severe indigestion/heartburn, and incontinence.    Physical Exam  General:  Well-developed,well-nourished,in no acute distress; alert,appropriate and cooperative throughout examination Mouth:  Oral mucosa and oropharynx without lesions or exudates.  Teeth in good repair. Neck:  No deformities, masses, or tenderness noted. Lungs:  Normal respiratory effort, chest expands symmetrically. Lungs are clear to auscultation, no crackles or wheezes. Heart:  Normal rate and regular rhythm. S1 and S2 normal without gallop, murmur, click, rub or other extra sounds. Extremities:  no peripheral edema.  crepitus with flexion and extension of both knees. Neurologic:  alert & oriented X3 and cranial nerves II-XII intact.   Psych:  normally interactive, good eye contact, not anxious appearing, and not depressed appearing.     Impression & Recommendations:  Problem # 1:  HYPERTENSION (ICD-401.9) Assessment Deteriorated increase amlodipine 10 mg daily. Recheck blood pressure one month. Switch Benicar to losartan 100 mg daily (for cost issues) Her updated medication list for this problem includes:    Amlodipine Besylate 10 Mg Tabs (Amlodipine besylate) ..... One by mouth once daily    Carvedilol 12.5 Mg Tabs (Carvedilol) ..... One by mouth two times a day    Furosemide 20 Mg Tabs (Furosemide) ..... Once daily    Losartan Potassium 100 Mg Tabs (  Losartan potassium) ..... One by mouth once daily  Problem # 2:  OSTEOPENIA (ICD-733.90) reviewed the excess scan results with patient. Discussed adequate calcium and vitamin D. No indication for bisphosphonate at this time.  Problem #  3:  DEGENERATIVE JOINT DISEASE (ICD-715.90) try off Etodolac, which may help her blood pressure and trial of tramadol. Her updated medication list for this problem includes:    Etodolac 400 Mg Tabs (Etodolac) ..... One by mouth two times a day    Adult Aspirin Ec Low Strength 81 Mg Tbec (Aspirin) ..... Once daily    Hydrocodone-acetaminophen 5-325 Mg Tabs (Hydrocodone-acetaminophen) .Marland Kitchen... 1-2 by mouth q 4-6 hours as needed pain    Tramadol Hcl 50 Mg Tabs (Tramadol hcl) .Marland Kitchen... 1-2 by mouth q 6 hours as needed pain  Complete Medication List: 1)  Etodolac 400 Mg Tabs (Etodolac) .... One by mouth two times a day 2)  Amlodipine Besylate 10 Mg Tabs (Amlodipine besylate) .... One by mouth once daily 3)  Carvedilol 12.5 Mg Tabs (Carvedilol) .... One by mouth two times a day 4)  Levothroid 75 Mcg Tabs (Levothyroxine sodium) .... Once daily 5)  Singulair 10 Mg Tabs (Montelukast sodium) .... As needed 6)  Furosemide 20 Mg Tabs (Furosemide) .... Once daily 7)  Losartan Potassium 100 Mg Tabs (Losartan potassium) .... One by mouth once daily 8)  Prevacid Solutab 15 Mg Tbdp (Lansoprazole) .... Once daily 9)  Fluticasone Propionate 50 Mcg/act Susp (Fluticasone propionate) .... Once daily 10)  Zyrtec Allergy 10 Mg Tabs (Cetirizine hcl) .... Once daily 11)  Multi For Her 50+ Tabs (Multiple vitamins-minerals) .... Once daily 12)  Advair Diskus 100-50 Mcg/dose Misc (Fluticasone-salmeterol) .... One puff in am and pm 13)  Adult Aspirin Ec Low Strength 81 Mg Tbec (Aspirin) .... Once daily 14)  Hydrocodone-acetaminophen 5-325 Mg Tabs (Hydrocodone-acetaminophen) .Marland Kitchen.. 1-2 by mouth q 4-6 hours as needed pain 15)  Tramadol Hcl 50 Mg Tabs (Tramadol hcl) .Marland Kitchen.. 1-2 by mouth q 6 hours as needed pain  Patient Instructions: 1)  Please schedule a follow-up appointment in 1 month.  2)  increase amlodipine to 10 mg daily Prescriptions: LOSARTAN POTASSIUM 100 MG TABS (LOSARTAN POTASSIUM) one by mouth once daily  #90 x 3    Entered and Authorized by:   Evelena Peat MD   Signed by:   Evelena Peat MD on 01/15/2010   Method used:   Print then Give to Patient   RxID:   [id number] TRAMADOL HCL 50 MG TABS (TRAMADOL HCL) 1-2 by mouth q 6 hours as needed pain  #120 x 1   Entered and Authorized by:   Evelena Peat MD   Signed by:   Evelena Peat MD on 01/15/2010   Method used:   Print then Give to Patient   RxID:   [id number]

## 2010-12-06 NOTE — Miscellaneous (Signed)
Summary: flu shot @ [facility]   Immunization History:  Influenza Immunization History:    Influenza:  got @ [employer] (07/28/2010)

## 2010-12-06 NOTE — Progress Notes (Signed)
Summary: REFILL  Phone Note Refill Request Call back at Work Phone [phone] Message from:  Patient on November 16, 2010 9:39 AM  Refills Requested: Medication #1:  FUROSEMIDE 20 MG TABS once daily   Dosage confirmed as above?Dosage Confirmed   Supply Requested: 3 months FAX # [phone] [facility] [place]  Next Appointment Scheduled: NONE Initial call taken by: Lavell Islam,  November 16, 2010 9:40 AM    Prescriptions: FUROSEMIDE 20 MG TABS (FUROSEMIDE) once daily  #90 Each x 0   Entered by:   Doristine Devoid CMA   Authorized by:   Neena Rhymes MD   Signed by:   Doristine Devoid CMA on 11/16/2010   Method used:   Reprint   RxID:   [id number]

## 2011-01-17 ENCOUNTER — Telehealth: Payer: Self-pay | Admitting: Family Medicine

## 2011-01-19 ENCOUNTER — Encounter: Payer: Self-pay | Admitting: Family Medicine

## 2011-01-22 ENCOUNTER — Other Ambulatory Visit: Payer: Self-pay | Admitting: Family Medicine

## 2011-01-22 MED ORDER — LOSARTAN POTASSIUM 100 MG PO TABS: 100.0000 mg | ORAL_TABLET | Freq: Every day | ORAL | Status: DC

## 2011-01-22 NOTE — Telephone Encounter (Signed)
Fax prescription to [facility] in [place], [place]. [phone]. Phone number is [phone] Patient is requesting a call when completed

## 2011-01-22 NOTE — Progress Notes (Signed)
Summary: Losartan--out of town  Phone Note Refill Request Call back at [facility] [phone] Message from:  Patient on January 17, 2011 12:32 PM  Refills Requested: Medication #1:  LOSARTAN POTASSIUM 100 MG TABS one by mouth once daily Patient is out in [place], [place]----please call in a 90 day prescription to :    [facility], [address] [place], [place], [place]    [facility] phone = [phone]    please call patient at [phone] to tell her when prescription has been called in  Initial call taken by: Jerolyn Shin,  January 17, 2011 12:37 PM  Follow-up for Phone Call        Patient is due for follow up, please advise. Lucious Groves CMA  January 17, 2011 1:10 PM   Additional Follow-up for Phone Call Additional follow up Details #1::        please ask pt how long she will be in [place].  she is a little overdue for her 6 month f/u and if she will be returning to [place] soon we can call in a 30 day script and she can schedule a BP f/u. Additional Follow-up by: Neena Rhymes MD,  January 17, 2011 1:15 PM    Additional Follow-up for Phone Call Additional follow up Details #2::    Spoke with patient and she will be there until 02/04/11. She is aware that she has an upcoming later in April. Per MD ok to give #90. [facility]axed to [facility] in [place]. Follow-up by: Lucious Groves CMA,  January 17, 2011 1:42 PM  Prescriptions: LOSARTAN POTASSIUM 100 MG TABS (LOSARTAN POTASSIUM) one by mouth once daily  #90 x 0   Entered by:   Lucious Groves CMA   Authorized by:   Neena Rhymes MD   Signed by:   Lucious Groves CMA on 01/17/2011   Method used:   Printed then faxed to ...       [facility]* (retail)       [address] [place], [place]  [address]       Ph: [phone]       Fax: [phone]   RxID:   [id number]

## 2011-01-23 ENCOUNTER — Other Ambulatory Visit: Payer: Self-pay | Admitting: Family Medicine

## 2011-01-23 MED ORDER — LOSARTAN POTASSIUM 100 MG PO TABS: 100.0000 mg | ORAL_TABLET | Freq: Every day | ORAL | Status: DC

## 2011-01-23 NOTE — Telephone Encounter (Signed)
Patient called at 3:10p.m. Stating she called [facility] and they have not received this any prescriptions for her.

## 2011-01-23 NOTE — Telephone Encounter (Signed)
This has been done electronically previously, phone in.

## 2011-01-24 ENCOUNTER — Telehealth: Payer: Self-pay | Admitting: Family Medicine

## 2011-01-24 NOTE — Telephone Encounter (Signed)
I called this in via phone yesterday and called pharmacy again to make sure it was received. I was notified that it was received and they didn't have qty of #90. They will order for her. They will contact the pt. Lucious Groves, CMA

## 2011-01-24 NOTE — Telephone Encounter (Signed)
Pt is calling back about her refill on losartan 100 mg to be fax to [facility] in [place]-- Fax# [phone] or [phone]

## 2011-02-07 ENCOUNTER — Other Ambulatory Visit: Payer: Self-pay | Admitting: Family Medicine

## 2011-02-07 ENCOUNTER — Other Ambulatory Visit: Payer: Self-pay | Admitting: *Deleted

## 2011-02-07 MED ORDER — FUROSEMIDE 20 MG PO TABS: 20.0000 mg | ORAL_TABLET | Freq: Every day | ORAL | Status: DC

## 2011-02-07 MED ORDER — LEVOTHYROXINE SODIUM 75 MCG PO CAPS: 1.0000 | ORAL_CAPSULE | Freq: Every day | ORAL | Status: DC

## 2011-02-07 NOTE — Telephone Encounter (Signed)
Pt aware Rx sent to pharmacy, pending appt for 02-28-11.

## 2011-02-07 NOTE — Telephone Encounter (Signed)
Ok for #30, no refills.  Pt overdue on 6 month f/u.  Needs to schedule appt.

## 2011-02-28 ENCOUNTER — Ambulatory Visit (INDEPENDENT_AMBULATORY_CARE_PROVIDER_SITE_OTHER): Payer: Medicare Other | Admitting: Family Medicine

## 2011-02-28 ENCOUNTER — Encounter: Payer: Self-pay | Admitting: Family Medicine

## 2011-02-28 DIAGNOSIS — I10 Essential (primary) hypertension: Secondary | ICD-10-CM

## 2011-02-28 DIAGNOSIS — E785 Hyperlipidemia, unspecified: Secondary | ICD-10-CM

## 2011-02-28 DIAGNOSIS — M1990 Unspecified osteoarthritis, unspecified site: Secondary | ICD-10-CM

## 2011-02-28 DIAGNOSIS — E039 Hypothyroidism, unspecified: Secondary | ICD-10-CM

## 2011-02-28 LAB — LIPID PANEL
Cholesterol: 205 mg/dL — ABNORMAL HIGH (ref 0–200)
HDL: 54.2 mg/dL (ref >39.00)
Total CHOL/HDL Ratio: 4
Triglycerides: 155 mg/dL — ABNORMAL HIGH (ref 0.0–149.0)
VLDL: 31 mg/dL (ref 0.0–40.0)

## 2011-02-28 LAB — TSH: TSH: 2.2 u[IU]/mL (ref 0.35–5.50)

## 2011-02-28 LAB — BASIC METABOLIC PANEL
CO2: 30 mEq/L (ref 19–32)
Calcium: 9.6 mg/dL (ref 8.4–10.5)
GFR: 51.31 mL/min — ABNORMAL LOW (ref >60.00)
Sodium: 141 mEq/L (ref 135–145)

## 2011-02-28 LAB — HEPATIC FUNCTION PANEL
Alkaline Phosphatase: 104 U/L (ref 39–117)
Bilirubin, Direct: 0.1 mg/dL (ref 0.0–0.3)
Total Bilirubin: 0.7 mg/dL (ref 0.3–1.2)
Total Protein: 7.3 g/dL (ref 6.0–8.3)

## 2011-02-28 NOTE — Assessment & Plan Note (Signed)
Pt has not worked on diet and exercise as promised.  Will recheck labs and consider starting meds prn.

## 2011-02-28 NOTE — Assessment & Plan Note (Signed)
Adequate but not ideal control.  Pt on multiple meds.  Check labs to assess electrolytes and kidney fxn.  Pt reports mild SOB but feels this is due to being out of shape and limited by her knee pain.

## 2011-02-28 NOTE — Assessment & Plan Note (Signed)
Due for labs today.  Adjust med prn.

## 2011-02-28 NOTE — Assessment & Plan Note (Signed)
Pt's R knee is 'bone on bone' by pt report.  She feels her pain and limited mobility are contributing to her weight gain and SOB.  Will refer to PT for home exercise program to improve ROM, mobility, and stamina.

## 2011-02-28 NOTE — Patient Instructions (Signed)
Follow up in 6 months for your complete physical (do not eat before this appt) We'll notify you of your lab results and make any med changes if needed Keep up the good work on diet and exercise We'll call you with your physical therapy appt for the knee mobility Call with any questions or concerns Hang in there!!!

## 2011-02-28 NOTE — Progress Notes (Signed)
  Subjective:    Patient ID: Susan Pittman, female    DOB: 1940-12-09, 70 y.o.   MRN: [id number]  HPI HTN- chronic problem for pt, fairly well controlled.  On Cozaar, Norvasc, Coreg, Lasix.  'i feel really good'.  Denies CP, HAs, visual changes.  Mild SOB w/ exertion, L LE edema.  Resuming water aerobics.  Having difficulty limiting salt intake.  Hypothyroid- chronic problem for pt, on synthroid.  Hyperlipidemia- cholesterol was mildly elevated at last visit, not on meds, due for labs today.  R knee DJD- chronic problem, s/p Synvisc injxn w/ some improvement in pain but still limited in strength and mobility.  Has never been in PT   Review of Systems For ROS see HPI     Objective:   Physical Exam  Constitutional: She is oriented to person, place, and time. She appears well-developed and well-nourished. No distress.  HENT:  Head: Normocephalic and atraumatic.  Eyes: Conjunctivae and EOM are normal. Pupils are equal, round, and reactive to light.  Neck: Normal range of motion. Neck supple. No thyromegaly present.  Cardiovascular: Normal rate, regular rhythm, normal heart sounds and intact distal pulses.   No murmur heard. Pulmonary/Chest: Effort normal and breath sounds normal. No respiratory distress.  Abdominal: Soft. She exhibits no distension. There is no tenderness.  Musculoskeletal: She exhibits no edema.  Lymphadenopathy:    She has no cervical adenopathy.  Neurological: She is alert and oriented to person, place, and time.  Skin: Skin is warm and dry.  Psychiatric: She has a normal mood and affect. Her behavior is normal.          Assessment & Plan:

## 2011-03-01 ENCOUNTER — Encounter: Payer: Self-pay | Admitting: *Deleted

## 2011-03-02 ENCOUNTER — Other Ambulatory Visit: Payer: Self-pay | Admitting: Family Medicine

## 2011-03-04 ENCOUNTER — Other Ambulatory Visit: Payer: Self-pay | Admitting: *Deleted

## 2011-03-04 MED ORDER — AMLODIPINE BESYLATE 10 MG PO TABS: 10.0000 mg | ORAL_TABLET | Freq: Every day | ORAL | Status: DC

## 2011-03-04 MED ORDER — CARVEDILOL 12.5 MG PO TABS: 12.5000 mg | ORAL_TABLET | Freq: Two times a day (BID) | ORAL | Status: DC

## 2011-03-04 NOTE — Telephone Encounter (Signed)
Sent by MD. 

## 2011-03-04 NOTE — Telephone Encounter (Signed)
Pt was seen in office on Friday, please advise.

## 2011-03-06 ENCOUNTER — Ambulatory Visit: Payer: Medicare Other | Attending: Family Medicine | Admitting: Physical Therapy

## 2011-03-06 DIAGNOSIS — M2550 Pain in unspecified joint: Secondary | ICD-10-CM | POA: Insufficient documentation

## 2011-03-06 DIAGNOSIS — IMO0001 Reserved for inherently not codable concepts without codable children: Secondary | ICD-10-CM | POA: Insufficient documentation

## 2011-03-06 DIAGNOSIS — M2560 Stiffness of unspecified joint, not elsewhere classified: Secondary | ICD-10-CM | POA: Insufficient documentation

## 2011-03-07 ENCOUNTER — Ambulatory Visit: Payer: Medicare Other | Admitting: Rehabilitation

## 2011-03-11 ENCOUNTER — Ambulatory Visit: Payer: Medicare Other | Admitting: Physical Therapy

## 2011-03-13 ENCOUNTER — Ambulatory Visit: Payer: Medicare Other | Admitting: Physical Therapy

## 2011-03-14 ENCOUNTER — Encounter: Payer: PRIVATE HEALTH INSURANCE | Admitting: Rehabilitation

## 2011-03-15 ENCOUNTER — Encounter: Payer: PRIVATE HEALTH INSURANCE | Admitting: Rehabilitation

## 2011-03-22 NOTE — H&P (Signed)
[place]  Patient:    Susan Pittman, Susan Pittman Visit Number: [id number] MRN: [id number]          Service Type: MED Location: [id number] Attending Physician:  Marily Memos Dictated by:   Tammy R. Collins [place], M.D. Admit Date:  03/27/2002                           History and Physical  DATE OF BIRTH: 1941-04-20  CHIEF COMPLAINT: Shortness of breath, throat swelling.  HISTORY OF PRESENT ILLNESS: Susan Pittman is a 70 year old white female, with a history of asthma, allergic rhinitis, and prior allergic reactions of unknown cause, who awoke this morning and started to feel some chest tightness and shortness of breath/wheezing that she experiences with an asthma exacerbation. She then noticed that her throat, lips, and face were swelling and she developed hives on her face.  She called EMS after taking a Benadryl, was stable when they arrived, and they recommended she take a second Benadryl. She did but symptoms started worsening.  She called our office and was told to call 911 immediately, and was transferred to the [facility] Emergency Room.  She has seen Susan Pittman approximately four years ago for allergy testing after a similar episode.  She was found to be allergic to a lot of different airborne allergies and also to wheat, but says that she can take it p.o. without difficulty but that airborne flower particles do bother her asthma.  She also has a SULFA allergy, although has been able to tolerate Maxzide and Bextra as well as Celebrex concerning allergic reactions.  She does have an intolerance to CELEBREX which has caused her to be achy.  PAST MEDICAL HISTORY:  1. Positive for hypertension.  2. DJD of the lower back.  3. Gastric ulcer in 1982 with GERD, esophagitis, hiatal hernia.  4. Mortons neuroma.  5. Meningioma, August 2001; was seen by Susan Pittman.  6. Allergic rhinitis.  7. Asthma.  8. Paroxysmal atrial  tachycardia.  9. Hypothyroidism. 10. Hysterectomy in 1970 for bleeding. 11. In 1998, cholecystectomy. 12. C5-6 fusion in 2000. 13. In 1996, left knee arthroscopy. 14. WHEAT allergy as above.  ALLERGIES:  1. SULFA, rash and hives.  2. CLINORIL, GI side effects.  3. CELEBREX, achy.  4. CLARITIN, question reaction.  5. ALLEGRA, back pain.  MEDICATIONS:  1. Maxzide 75/50 mg one p.o. q.d.  2. Zyrtec 10 mg one p.o. q.d.  3. Lotensin 5 mg one p.o. q.d.  4. Prevacid 30 mg one p.o. q.d.  5. Evista 60 mg one p.o. q.d.  6. Flonase two sprays in each nostril q.d.  7. Multivitamin q.d.  8. Advair 100/50 mg one inhalation b.i.d.  9. Toprol-XL 25 mg one p.o. q.d. 10. Bextra 10 mg one p.o. q.d.  She has been on this for several months now.     (The Bextra as well as the other medications).  FAMILY HISTORY: Positive for CAD and stroke in her father.  Diabetes and hypertension in her mother.  Breast cancer in a sister.  Leukemia in grandmother.  SOCIAL HISTORY: No tobacco, alcohol, or drugs.  She is married.  REVIEW OF SYSTEMS: Positive for right knee pain, question meniscal tear; scheduled for an MRI.  No headache, fever, dizziness, hematemesis, nausea, vomiting, diarrhea, abdominal pain, weight loss, dysuria, frequency, or lower extremity edema.  PHYSICAL EXAMINATION:  VITAL SIGNS: TEMP 98.0 degrees, BP  172/94, pulse 65, respirations 20.  Pulse oximetry 100% on room air.  GENERAL: NAD.  SKIN: Warm and dry.  Slight facial erythema and swelling.  HEENT: TMs are clear.  EOMI.  PERRL.  Fundi without lesions.  She has swelling of the upper lip and the uvula, and puffiness of her face.  Good dentition.  NECK: Supple.  No TM or adenopathy.  No JVD or bruits.  LUNGS: Clear to auscultation bilaterally.  No wheezes or crackles.  CV: Regular rate and rhythm.  S1 and S2.  No MHR.  ABDOMEN: Positive bowel sounds.  NT/ND.  No HSM or masses.  GU/RECTAL: Deferred.  She had Pap smear and  breast examination April 2003.  EXTREMITIES: No CCE.  Pulses 2+.  MUSCULOSKELETAL: Pain in the right knee with valgus stress.  NEUROLOGIC: Alert and oriented x4.  Cranial nerves II-XII intact.  Motor 5/5. Trace biceps and 1+ knee DTRs bilaterally.  ASSESSMENT/PLAN: This is a 70 year old white female with a severe allergic reaction of unknown cause with angioedema and history of asthma, which puts her at high risk for serious complications.  She has an allergy to SULFA but has tolerated hydrochlorothiazide and Celebrex in the past, but this could be related to her current reaction through Maxzide and/or Bextra, and both will be held.  She also has a WHEAT allergy; therefore, a gluten-free diet will be requested, although p.o. has not bothered her previously.  She will be started on p.o. prednisone, Pepcid intravenously.  Was given epinephrine in the emergency room.  Will have Benadryl p.r.n., epinephrine at the bedside.  She will be observed at least overnight and will need follow-up with Susan Pittman. Dictated by:   Tammy R. Collins [place], M.D. Attending Physician:  Marily Memos DD:  03/27/02 TD:  03/28/02 Job: [id number]

## 2011-03-22 NOTE — Cardiovascular Report (Signed)
Susan Pittman, Susan Pittman               ACCOUNT NO.:  [id number]   MEDICAL RECORD NO.:  [id number]          PATIENT TYPE:  OIB   LOCATION:  6501                         FACILITY:  [facility]   PHYSICIAN:  Peter M. Swaziland, M.D.  DATE OF BIRTH:  [birth year]   DATE OF PROCEDURE:  04/18/2005  DATE OF DISCHARGE:                              CARDIAC CATHETERIZATION   PROCEDURES:  Left heart catheterization, coronary and left ventricular  angiography.   INDICATIONS FOR PROCEDURE:  A 70 year old white female with a history of  hypertension, morbid obesity, and history of supraventricular tachycardia  who presents with increased lower extremity edema. Subsequent stress  Cardiolite study suggested apical ischemia.   ACCESS:  Via the right femoral artery using standard Seldinger technique.   EQUIPMENT:  A 4-French 4 cm right and left Judkins catheter, 4-French  pigtail catheter, and 4-French arterial sheath.   MEDICATIONS:  Local anesthesia 1% Xylocaine, Versed 2 milligrams  intravenously.   CONTRAST:  Omnipaque 100 mL.   HEMODYNAMIC DATA:  Aortic pressure was 167/83 with a mean of 117. Left  ventricular pressure was 178 with EDP of 25 mmHg.   ANGIOGRAPHIC DATA:  1.  The left coronary artery arises and distributes normally. The left main      coronary artery is normal.  2.  The left anterior descending artery is normal throughout its course and      gives rise to two diagonal branches which appear normal.  3.  The left circumflex coronary artery is normal.  4.  The right coronary artery is a large dominant vessel and is normal      throughout.  5.  Left ventricular angiography performed in RAO view demonstrates normal      left ventricular size and contractility with normal systolic function.      Ejection fraction is estimated 65%. There is no significant mitral      insufficiency.   FINAL INTERPRETATION:  1.  Normal coronary anatomy.  2.  Normal left ventricular function.       PMJ/MEDQ  D:  04/18/2005  T:  04/18/2005  Job:  [id number]   cc:   Teena Irani. Arlyce Dice, M.D.  [address]  [place]  [place] [address]  Fax: [phone]

## 2011-03-22 NOTE — Op Note (Signed)
NAME:  Susan Pittman, Susan Pittman                         ACCOUNT NO.:  [id number]   MEDICAL RECORD NO.:  [id number]                   PATIENT TYPE:  AMB   LOCATION:  ENDO                                 FACILITY:  [facility]   PHYSICIAN:  Anselmo Rod, M.D.               DATE OF BIRTH:  [birth year]   DATE OF PROCEDURE:  04/20/2004  DATE OF DISCHARGE:                                 OPERATIVE REPORT   PROCEDURE:  Screening colonoscopy.   ENDOSCOPIST:  Charna Elizabeth, M.D.   INSTRUMENT USED:  Olympus video colonoscope.   INDICATIONS FOR PROCEDURE:  71 year old white female with a personal history  of colonic polyps undergoing a repeat colonoscopy.  Rule out colonic polyps,  masses, etc.   PREPROCEDURE PREPARATION:  Informed consent was obtained from the patient.  The patient was fasted for eight hours prior to the procedure and prepped  with a bottle of magnesium citrate and a gallon of GoLYTELY the night prior  to the procedure.   PREPROCEDURE PHYSICAL:  Patient with stable vital signs.  Neck supple.  Chest clear to auscultation.  S1 and S2 regular.  Abdomen soft with normal  bowel sounds.   DESCRIPTION OF PROCEDURE:  The patient was placed in the left lateral  decubitus position, sedated with 40 mg of Demerol and 4 mg Versed  intravenously.  Once the patient was adequately sedated, maintained on low  flow oxygen and continuous cardiac monitoring, the Olympus video colonoscope  was advanced from the rectum to the cecum.  The appendiceal orifice and  ileocecal valve were clearly visualized and photographed.  There were a few  scattered diverticula seen in the sigmoid colon.  The rest of the exam  including the cecum was unrevealing.  No masses or polyps were seen.  Retroflexion in the rectum revealed no abnormalities.   IMPRESSION:  Essentially normal colonoscopy to the cecum except for a few  sigmoid diverticula.   RECOMMENDATIONS:  1. Continue a high fiber diet with liberal fluid intake.  2. Brochures on diverticulosis have been given to the patient for education.  3. Repeat colonoscopy is recommended in the next ten years unless the     patient develops any abnormal symptoms in the interim.  4. Outpatient follow up as need arises in the future.                                               Anselmo Rod, M.D.    JNM/MEDQ  D:  04/20/2004  T:  04/20/2004  Job:  [id number]   cc:   Teena Irani. Arlyce Dice, M.D.  [address]  [place]  [place] [address]  Fax: [phone]

## 2011-03-22 NOTE — H&P (Signed)
NAMEOMNIA, DOLLINGER NO.:  [id number]   MEDICAL RECORD NO.:  [id number]         PATIENT TYPE:  JCAR   LOCATION:                               FACILITY:  [facility]   PHYSICIAN:  Peter M. Swaziland, M.D.  DATE OF BIRTH:  26-May-1941   DATE OF ADMISSION:  04/18/2005  DATE OF DISCHARGE:                                HISTORY & PHYSICAL   DATE OF ADMISSION:  April 18, 2005   HISTORY OF PRESENT ILLNESS:  [name] is a 70 year old white female who  is seen for evaluation of chest pain and lower extremity edema. The patient  has a remote history of supraventricular tachycardia but has had no recent  symptoms of that. She had previous cardiac workup in 1999 at which time a  Cardiolite study was normal. Recently she had presented with increasing  lower extremity edema. She was placed on Lasix twice a day and this has  improved. She has also been somewhat short of breath. On the day she  presented to her primary care physician she had also complained of heaviness  in her chest that day. For further evaluation, she underwent an  echocardiogram which showed mild concentric LVH with normal systolic  function. She had mild left atrial enlargement. Valvular function was  normal. She subsequently had a stress Cardiolite study. She was able to  exercise for 6 minutes on the Bruce protocol and had to stop due to dyspnea.  She had 1 mm of ST segment depression inferiorly. Cardiolite images  demonstrated a reversible apical defect with normal left ventricular  function. Based on these findings, further evaluation with cardiac  catheterization was recommended.   PAST MEDICAL HISTORY:  Includes a history of hypertension, history of morbid  obesity. History of supraventricular tachycardia. She has no history of  diabetes or hypercholesterolemia. Prior surgeries include vaginal  hysterectomy, cholecystectomy, left knee arthroscopy, and cervical fusion.   ALLERGIES:  1.  SULFA.  2.   CELEBREX.  3.  CLARITIN.  4.  ALLEGRA.   CURRENT MEDICATIONS:  1.  Zyrtec 10 mg per day.  2.  Evista 60 mg per day.  3.  Prevacid 30 mg per day.  4.  Multivitamin daily.  5.  Lasix 20 mg per day.  6.  Toprol-XL 25 mg per day.  7.  Etodolac 400 mg b.i.d.  8.  Singulair 10 mg daily.  9.  Advair 250/50 daily.  10. Flonase one spray each nostril daily.   FAMILY HISTORY:  Father has a history of coronary artery bypass surgery.  Mother has a history of hypertension. One sister has breast cancer.   SOCIAL HISTORY:  The patient is married. The patient has worked in [employer] firm. She does not smoke or use alcohol.   REVIEW OF SYSTEMS:  Otherwise unremarkable.   PHYSICAL EXAMINATION:  GENERAL:  The patient is a pleasant, obese, white  female in no apparent distress.  VITAL SIGNS:  Weight is 250, blood pressure is 148/90, pulse 60 and regular.  HEENT:  She has no jugular venous distention or bruits. Pupils equal,  round,  and reactive to light and accommodation. Oropharynx is clear.  LUNGS:  Clear.  CARDIAC:  Reveals a regular rate and rhythm without gallop, murmur, rub, or  click.  ABDOMEN:  Obese, soft, nontender. She has no hepatosplenomegaly, masses, or  bruits. Femoral and pedal pulses are 2+ and symmetric. She has 1+ lower  extremity edema.   LABORATORY DATA:  Resting ECG demonstrates normal sinus rhythm, normal ECG.  Chest x-ray from December 2005 shows bibasilar atelectasis with no active  disease. Other labs are pending.   IMPRESSION:  1.  Abnormal Cardiolite study showing small area of apical ischemia.  2.  Lower extremity edema, probably related to venous insufficiency with      echocardiogram showing normal left ventricular function.  3.  Hypertension.  4.  Morbid obesity.  5.  Family history of coronary disease.   PLAN:  The patient will proceed with diagnostic cardiac catheterization.           ______________________________  Peter M. Swaziland,  M.D.     PMJ/MEDQ  D:  04/15/2005  T:  04/15/2005  Job:  [id number]   cc:   Teena Irani. Arlyce Dice, M.D.  [address]  [place]  [place] [address]  Fax: [phone]

## 2011-04-03 ENCOUNTER — Ambulatory Visit: Payer: Medicare Other | Admitting: Physical Therapy

## 2011-04-10 ENCOUNTER — Ambulatory Visit: Payer: Medicare Other | Attending: Family Medicine | Admitting: Physical Therapy

## 2011-04-10 ENCOUNTER — Ambulatory Visit (INDEPENDENT_AMBULATORY_CARE_PROVIDER_SITE_OTHER): Payer: Medicare Other | Admitting: Family Medicine

## 2011-04-10 VITALS — BP 140/78 | HR 63 | Temp 98.6°F | Wt 256.4 lb

## 2011-04-10 DIAGNOSIS — R42 Dizziness and giddiness: Secondary | ICD-10-CM | POA: Insufficient documentation

## 2011-04-10 DIAGNOSIS — IMO0001 Reserved for inherently not codable concepts without codable children: Secondary | ICD-10-CM | POA: Insufficient documentation

## 2011-04-10 DIAGNOSIS — M2560 Stiffness of unspecified joint, not elsewhere classified: Secondary | ICD-10-CM | POA: Insufficient documentation

## 2011-04-10 DIAGNOSIS — E039 Hypothyroidism, unspecified: Secondary | ICD-10-CM

## 2011-04-10 DIAGNOSIS — M2550 Pain in unspecified joint: Secondary | ICD-10-CM | POA: Insufficient documentation

## 2011-04-10 LAB — CBC WITH DIFFERENTIAL/PLATELET
Basophils Absolute: 0.1 10*3/uL (ref 0.0–0.1)
Eosinophils Absolute: 0.2 10*3/uL (ref 0.0–0.7)
Hemoglobin: 13.8 g/dL (ref 12.0–15.0)
Lymphocytes Relative: 26.9 % (ref 12.0–46.0)
Lymphs Abs: 1.8 10*3/uL (ref 0.7–4.0)
MCHC: 33.7 g/dL (ref 30.0–36.0)
Neutro Abs: 4.4 10*3/uL (ref 1.4–7.7)
Platelets: 263 10*3/uL (ref 150.0–400.0)
RDW: 15.4 % — ABNORMAL HIGH (ref 11.5–14.6)

## 2011-04-10 LAB — BASIC METABOLIC PANEL
BUN: 37 mg/dL — ABNORMAL HIGH (ref 6–23)
Calcium: 9.4 mg/dL (ref 8.4–10.5)
Creatinine, Ser: 1.2 mg/dL (ref 0.4–1.2)
GFR: 48.75 mL/min — ABNORMAL LOW (ref >60.00)
Glucose, Bld: 76 mg/dL (ref 70–99)
Potassium: 4.9 mEq/L (ref 3.5–5.1)

## 2011-04-10 LAB — TSH: TSH: 1.16 u[IU]/mL (ref 0.35–5.50)

## 2011-04-10 NOTE — Patient Instructions (Signed)
Follow up in 1 month Decrease your Carvedilol (Coreg) to 1/2 tab twice daily Increase your water intake If your symptoms continue or worsen, please call or go to the ER Hang in there!

## 2011-04-10 NOTE — Progress Notes (Signed)
  Subjective:    Patient ID: Susan Pittman, female    DOB: 1941-05-09, 70 y.o.   MRN: [id number]  HPI Dizziness- had episode this AM and BP was 116/63.  Seems to occur about an hr after taking meds.  Home BPs ranging 108-130/60-88.  Pulse ranging 50-64.  2 episodes occurred while in the shower.  Episodes are occuring every 2-3 days, typically in the mornings.  Not affected by eating.  sxs improve w/ sitting down.  No nausea, SOB, CP.                                                                                                                                                                                                                             Review of Systems For ROS see HPI     Objective:   Physical Exam  Constitutional: She is oriented to person, place, and time. She appears well-developed and well-nourished. No distress.  HENT:  Head: Normocephalic and atraumatic.  Eyes: Conjunctivae and EOM are normal. Pupils are equal, round, and reactive to light.  Neck: Normal range of motion. Neck supple. No thyromegaly present.  Cardiovascular: Normal rate, regular rhythm, normal heart sounds and intact distal pulses.   No murmur heard. Pulmonary/Chest: Effort normal and breath sounds normal. No respiratory distress.  Abdominal: Soft. She exhibits no distension. There is no tenderness.  Musculoskeletal: She exhibits no edema.  Lymphadenopathy:    She has no cervical adenopathy.  Neurological: She is alert and oriented to person, place, and time. She has normal reflexes. No cranial nerve deficit or sensory deficit. Coordination and gait normal.  Skin: Skin is warm and dry.  Psychiatric: She has a normal mood and affect. Her behavior is normal.          Assessment & Plan:

## 2011-04-11 ENCOUNTER — Encounter: Payer: Self-pay | Admitting: *Deleted

## 2011-04-14 NOTE — Assessment & Plan Note (Signed)
Pt's dizziness is most likely due to inability to increase HR to compensate for drop in BP.  Will lower beta blocker.  Discussed importance of staying well hydrated and changing positions slowly.  EKG normal.  Will check labs to r/o anemia or thyroid problems.  Reviewed supportive care and red flags that should prompt return.  Pt expressed understanding and is in agreement w/ plan.

## 2011-04-29 ENCOUNTER — Other Ambulatory Visit: Payer: Self-pay | Admitting: Family Medicine

## 2011-04-29 MED ORDER — LOSARTAN POTASSIUM 100 MG PO TABS
100.0000 mg | ORAL_TABLET | Freq: Every day | ORAL | Status: DC
Start: 2011-04-29 — End: 2011-04-29

## 2011-04-29 NOTE — Telephone Encounter (Signed)
Refill sent.

## 2011-04-29 NOTE — Telephone Encounter (Signed)
Dr Beverely Low pt, will route to her

## 2011-04-30 NOTE — Telephone Encounter (Signed)
Done

## 2011-04-30 NOTE — Telephone Encounter (Signed)
Ok for #90, 3 refills 

## 2011-05-10 ENCOUNTER — Encounter: Payer: Self-pay | Admitting: Family Medicine

## 2011-05-10 ENCOUNTER — Other Ambulatory Visit: Payer: Self-pay | Admitting: Family Medicine

## 2011-05-10 ENCOUNTER — Ambulatory Visit (INDEPENDENT_AMBULATORY_CARE_PROVIDER_SITE_OTHER): Payer: Medicare Other | Admitting: Family Medicine

## 2011-05-10 DIAGNOSIS — I10 Essential (primary) hypertension: Secondary | ICD-10-CM

## 2011-05-10 DIAGNOSIS — E669 Obesity, unspecified: Secondary | ICD-10-CM

## 2011-05-10 NOTE — Assessment & Plan Note (Signed)
BP is well controlled today.  Pt feels well after decreasing Coreg.  Asymptomatic.  Will continue to follow- no changes at this time.

## 2011-05-10 NOTE — Assessment & Plan Note (Signed)
For pt's current size she should eat a 1400-1500 calorie diet.  Discussed the importance of protein at every meal.  Pt to continue her regular exercise- increasing the frequency and duration as able.  Will continue to follow.

## 2011-05-10 NOTE — Telephone Encounter (Signed)
Refill sent.

## 2011-05-10 NOTE — Patient Instructions (Signed)
Schedule your complete physical in October- do not eat before this appt Your blood pressure looks great! Continue a 1400-1500 calorie diet and get regular exercise- do not starve yourself!!! Call with any questions or concerns Have a great summer!!!

## 2011-05-10 NOTE — Progress Notes (Signed)
  Subjective:    Patient ID: Susan Pittman, female    DOB: 07-02-1941, 70 y.o.   MRN: [id number]  HPI HTN- decreased her Coreg to 1/2 tab at last visit.  Since then, has had no dizzy spells, is able to now feel the aerobic effects of her water exercise.  No CP, SOB, edema, HAs, visual changes.    Obesity- reports she was searching the internet and was told by various sites that her diet should range from 1000-1400 calories.  She is confused by how much she needs to eat.  Would like to lose weight.  Is going to water aerobics regularly.  Review of Systems For ROS see HPI     Objective:   Physical Exam  Constitutional: She is oriented to person, place, and time. She appears well-developed and well-nourished. No distress.  HENT:  Head: Normocephalic and atraumatic.  Eyes: Conjunctivae and EOM are normal. Pupils are equal, round, and reactive to light.  Neck: Normal range of motion. Neck supple. No thyromegaly present.  Cardiovascular: Normal rate, regular rhythm, normal heart sounds and intact distal pulses.   No murmur heard. Pulmonary/Chest: Effort normal and breath sounds normal. No respiratory distress.  Abdominal: Soft. She exhibits no distension. There is no tenderness.  Musculoskeletal: She exhibits no edema.  Lymphadenopathy:    She has no cervical adenopathy.  Neurological: She is alert and oriented to person, place, and time.  Skin: Skin is warm and dry.  Psychiatric: She has a normal mood and affect. Her behavior is normal.          Assessment & Plan:

## 2011-05-22 ENCOUNTER — Other Ambulatory Visit: Payer: Self-pay | Admitting: Family Medicine

## 2011-05-22 NOTE — Telephone Encounter (Signed)
Refill sent.

## 2011-06-27 ENCOUNTER — Encounter: Payer: Self-pay | Admitting: Family Medicine

## 2011-07-15 ENCOUNTER — Other Ambulatory Visit: Payer: Self-pay | Admitting: Family Medicine

## 2011-07-25 ENCOUNTER — Encounter: Payer: Self-pay | Admitting: Family Medicine

## 2011-07-25 ENCOUNTER — Ambulatory Visit (INDEPENDENT_AMBULATORY_CARE_PROVIDER_SITE_OTHER): Payer: Medicare Other | Admitting: Family Medicine

## 2011-07-25 DIAGNOSIS — Z0000 Encounter for general adult medical examination without abnormal findings: Secondary | ICD-10-CM

## 2011-07-25 DIAGNOSIS — Z23 Encounter for immunization: Secondary | ICD-10-CM

## 2011-07-25 DIAGNOSIS — I10 Essential (primary) hypertension: Secondary | ICD-10-CM

## 2011-07-25 DIAGNOSIS — E039 Hypothyroidism, unspecified: Secondary | ICD-10-CM

## 2011-07-25 DIAGNOSIS — E785 Hyperlipidemia, unspecified: Secondary | ICD-10-CM

## 2011-07-25 LAB — LIPID PANEL
LDL Cholesterol: 121 mg/dL — ABNORMAL HIGH (ref 0–99)
Total CHOL/HDL Ratio: 3
VLDL: 20.2 mg/dL (ref 0.0–40.0)

## 2011-07-25 LAB — CBC WITH DIFFERENTIAL/PLATELET
Basophils Absolute: 0 10*3/uL (ref 0.0–0.1)
Basophils Relative: 0.6 % (ref 0.0–3.0)
Eosinophils Absolute: 0.2 10*3/uL (ref 0.0–0.7)
Lymphocytes Relative: 22.8 % (ref 12.0–46.0)
MCHC: 33 g/dL (ref 30.0–36.0)
Monocytes Relative: 5.4 % (ref 3.0–12.0)
Neutrophils Relative %: 68.7 % (ref 43.0–77.0)
RBC: 4.92 Mil/uL (ref 3.87–5.11)
RDW: 15.3 % — ABNORMAL HIGH (ref 11.5–14.6)

## 2011-07-25 LAB — HEPATIC FUNCTION PANEL
ALT: 20 U/L (ref 0–35)
AST: 24 U/L (ref 0–37)
Bilirubin, Direct: 0.1 mg/dL (ref 0.0–0.3)
Total Bilirubin: 0.6 mg/dL (ref 0.3–1.2)

## 2011-07-25 LAB — BASIC METABOLIC PANEL
CO2: 30 mEq/L (ref 19–32)
Chloride: 102 mEq/L (ref 96–112)
Potassium: 4.6 mEq/L (ref 3.5–5.1)

## 2011-07-25 NOTE — Progress Notes (Signed)
  Subjective:    Patient ID: Susan Pittman, female    DOB: 11/05/1940, 70 y.o.   MRN: [id number]  HPI Here today for CPE.  Risk Factors: HTN- chronic problem for pt, has not been taking BP at home 'b/c i've been feeling good'.  No CP, SOB, HAs, visual changes, edema. Hypothyroid- chronic problem, on Synthroid. Having heat intolerance x2 months, 'but it doesn't last long' Hyperlipidemia- new problem for pt, no meds.  Has been working on diet and exercise. Physical Activity: doing water aerobics 4-5x/week Fall Risk: low risk Depression: denies sxs Hearing: normal to conversational tones, somewhat decreased to whispered voice. ADL's: independent Cognitive: normal linear thought process, memory and attention span intact. Home Safety: safe at home, lives w/ husband Height, Weight, BMI, Visual Acuity: see vitals, vision corrected to 20/20 w/ glasses Counseling: UTD on mammo, UTD on pap, UTD on colonoscopy (Dr Loreta Ave), UTD on pneumovax.  Would like flu shot.  Has healthcare POA and living wills. Labs Ordered: See A&P Care Plan: See A&P   Review of Systems Patient reports no vision/ hearing  changes, adenopathy,fever, weight change,  persistant / recurrent hoarseness , swallowing issues, chest pain,palpitations,edema,persistant /recurrent cough, hemoptysis, dyspnea( rest/ exertional/paroxysmal nocturnal), gastrointestinal bleeding(melena, rectal bleeding), abdominal pain, significant heartburn bowel changes,GU symptoms(dysuria, hematuria,pyuria, incontinence) ), Gyn symptoms(abnormal  bleeding , pain),  syncope, focal weakness, memory loss,numbness & tingling, skin/hair /nail changes,abnormal bruising or bleeding, anxiety,or depression.     Objective:   Physical Exam  General Appearance:    Alert, cooperative, no distress, appears stated age  Head:    Normocephalic, without obvious abnormality, atraumatic  Eyes:    PERRL, conjunctiva/corneas clear, EOM's intact, fundi    benign, both eyes    Ears:    Normal TM's and external ear canals, both ears  Nose:   Nares normal, septum midline, mucosa normal, no drainage    or sinus tenderness  Throat:   Lips, mucosa, and tongue normal; teeth and gums normal  Neck:   Supple, symmetrical, trachea midline, no adenopathy;    Thyroid: no enlargement/tenderness/nodules  Back:     Symmetric, no curvature, ROM normal, no CVA tenderness  Lungs:     Clear to auscultation bilaterally, respirations unlabored  Chest Wall:    No tenderness or deformity   Heart:    Regular rate and rhythm, S1 and S2 normal, no murmur, rub   or gallop  Breast Exam:    No tenderness, masses, or nipple abnormality  Abdomen:     Soft, non-tender, bowel sounds active all four quadrants,    no masses, no organomegaly  Genitalia:    deferred  Rectal:    Extremities:   Extremities normal, atraumatic, no cyanosis or edema  Pulses:   2+ and symmetric all extremities  Skin:   Skin color, texture, turgor normal, no rashes or lesions  Lymph nodes:   Cervical, supraclavicular, and axillary nodes normal  Neurologic:   CNII-XII intact, normal strength, sensation and reflexes    throughout          Assessment & Plan:

## 2011-07-25 NOTE — Patient Instructions (Signed)
Follow up in 1 month to recheck blood pressure We'll notify you of your lab results Keep up the good work on diet and exercise Call with any questions or concerns Have a great time at the beach!!

## 2011-07-26 ENCOUNTER — Other Ambulatory Visit: Payer: Self-pay | Admitting: Family Medicine

## 2011-07-30 NOTE — Assessment & Plan Note (Signed)
Pt's PE WNL.  UTD on health maintenance.  Had pap last yr- is on q3 yr schedule.  Check labs.  Anticipatory guidance provided.

## 2011-07-30 NOTE — Assessment & Plan Note (Signed)
Chronic problem, on synthroid.  Typically well controlled.  Check labs and adjust meds prn.

## 2011-07-30 NOTE — Assessment & Plan Note (Signed)
Chronic problem for pt, usually better controlled.  Elevated today- asymptomatic.  Will recheck in 1 month and adjust meds prn.  Pt to resume checking BP at home and call if persistently high.  Pt expressed understanding and is in agreement w/ plan.

## 2011-07-30 NOTE — Assessment & Plan Note (Addendum)
Was new dx for pt at last visit, not on meds.  Attempting to control w/ diet and exercise.  Check labs and start meds prn.

## 2011-08-23 ENCOUNTER — Ambulatory Visit (INDEPENDENT_AMBULATORY_CARE_PROVIDER_SITE_OTHER): Payer: Medicare Other | Admitting: Family Medicine

## 2011-08-23 ENCOUNTER — Other Ambulatory Visit: Payer: Self-pay | Admitting: Family Medicine

## 2011-08-23 ENCOUNTER — Encounter: Payer: Self-pay | Admitting: Family Medicine

## 2011-08-23 DIAGNOSIS — I10 Essential (primary) hypertension: Secondary | ICD-10-CM

## 2011-08-23 MED ORDER — AMLODIPINE BESYLATE 10 MG PO TABS: 10.0000 mg | ORAL_TABLET | Freq: Every day | ORAL | Status: DC

## 2011-08-23 NOTE — Progress Notes (Signed)
  Subjective:    Patient ID: Susan Pittman, female    DOB: [birth year], 70 y.o.   MRN: [id number]  HPI HTN- chronic problem for pt, was elevated at last visit.  Much better today.  2 weeks ago had episode of low BP 30 minutes after breakfast (112/58).  Felt weak but sxs resolved after resting.  No CP, SOB, HAs, visual changes, edema.   Review of Systems For ROS see HPI     Objective:   Physical Exam  Vitals reviewed. Constitutional: She is oriented to person, place, and time. She appears well-developed and well-nourished. No distress.  HENT:  Head: Normocephalic and atraumatic.  Eyes: Conjunctivae and EOM are normal. Pupils are equal, round, and reactive to light.  Neck: Normal range of motion. Neck supple. No thyromegaly present.  Cardiovascular: Normal rate, regular rhythm, normal heart sounds and intact distal pulses.   No murmur heard. Pulmonary/Chest: Effort normal and breath sounds normal. No respiratory distress.  Abdominal: Soft. She exhibits no distension. There is no tenderness.  Musculoskeletal: She exhibits no edema.  Lymphadenopathy:    She has no cervical adenopathy.  Neurological: She is alert and oriented to person, place, and time.  Skin: Skin is warm and dry.  Psychiatric: She has a normal mood and affect. Her behavior is normal.          Assessment & Plan:

## 2011-08-23 NOTE — Patient Instructions (Signed)
Follow up in 6 months to recheck BP Continue all current meds- no changes at this time Keep up the good work- you look great! Happy Holidays!!!

## 2011-08-25 ENCOUNTER — Encounter: Payer: Self-pay | Admitting: Family Medicine

## 2011-08-25 NOTE — Assessment & Plan Note (Signed)
Pt's BP much better controlled today than previous.  Asymptomatic.  Only 1 episode of low BP.  No changes at this time.  Will continue to monitor.

## 2011-09-13 ENCOUNTER — Other Ambulatory Visit: Payer: Self-pay | Admitting: Family Medicine

## 2011-09-17 ENCOUNTER — Telehealth: Payer: Self-pay | Admitting: *Deleted

## 2011-09-17 MED ORDER — ETODOLAC 400 MG PO TABS: ORAL_TABLET | ORAL | Status: DC

## 2011-09-17 NOTE — Telephone Encounter (Signed)
Unfortunately we have no documentation of these phone calls, nor have I ever denied a refill request.  I'm not sure what the problem is but we will certainly look into the situation.

## 2011-09-17 NOTE — Telephone Encounter (Signed)
Pt called to advise her medication refills have not been getting filled unless she calls several time to the office. I apologized for any inconvience and advised that I would send the medication once verified. The pt stated she does not take ibuprofen OTC per she has taken this medication for years now. I did note a allergy to sulindac which is also in the same medication family. Pt verified that she has not reaction to the Etodolac medication. Noted last refill on 03-02-2011 120# refill x 1. Verbally spoke to [facility] to verfiy 120# to be sent today and MD tabori can send more later per not in office. Neurosurgeon about situation. Will speak with Tabori about refill next business day. Sent Etodolac via escript and called [facility] to speak with Pharmacy Staff Deanna to verify that the medication had been sent correctly and that they will have it ready soon. Called pt to advise the medication is at [facility] for pick up shortly and that I will speak to Tabori about the refills from this day forward to make sure she does not have anymore concerns. Pt understood and was okay with solution.

## 2011-09-18 ENCOUNTER — Other Ambulatory Visit: Payer: Self-pay | Admitting: *Deleted

## 2011-09-18 MED ORDER — ETODOLAC 400 MG PO TABS: ORAL_TABLET | ORAL | Status: DC

## 2011-09-18 NOTE — Telephone Encounter (Signed)
Spoke to pt to advise that MD had not refused any of her refill requests nor were any telephone calls noted however we will investigate the issue. Pt did advise that she did pick up the refill for the etodalac however wanted the rx changed to ONE tablet BID, changed in the chart and sent with 3 refills. Pt understood and was satisfied at this time

## 2011-10-21 ENCOUNTER — Other Ambulatory Visit: Payer: Self-pay | Admitting: Family Medicine

## 2011-10-21 NOTE — Telephone Encounter (Signed)
rx sent to pharmacy by e-script  

## 2011-11-07 ENCOUNTER — Ambulatory Visit (INDEPENDENT_AMBULATORY_CARE_PROVIDER_SITE_OTHER): Payer: Medicare Other | Admitting: Family Medicine

## 2011-11-07 ENCOUNTER — Ambulatory Visit: Payer: Medicare Other | Admitting: Family Medicine

## 2011-11-07 ENCOUNTER — Encounter: Payer: Self-pay | Admitting: Family Medicine

## 2011-11-07 VITALS — BP 140/82 | HR 79 | Temp 98.9°F | Wt 254.8 lb

## 2011-11-07 DIAGNOSIS — J029 Acute pharyngitis, unspecified: Secondary | ICD-10-CM | POA: Diagnosis not present

## 2011-11-07 LAB — POCT RAPID STREP A (OFFICE): Rapid Strep A Screen: NEGATIVE

## 2011-11-07 MED ORDER — AMOXICILLIN 875 MG PO TABS: 875.0000 mg | ORAL_TABLET | Freq: Two times a day (BID) | ORAL | Status: AC

## 2011-11-07 MED ORDER — FUROSEMIDE 20 MG PO TABS: 20.0000 mg | ORAL_TABLET | ORAL | Status: DC

## 2011-11-07 NOTE — Patient Instructions (Signed)

## 2011-11-07 NOTE — Progress Notes (Signed)
  Subjective:     Susan Pittman is a 71 y.o. female who presents for evaluation of sore throat. Associated symptoms include dry cough, post nasal drip, sinus and nasal congestion and sore throat. Onset of symptoms was 5 days ago, and have been gradually worsening since that time. She is drinking plenty of fluids. She has not had a recent close exposure to someone with proven streptococcal pharyngitis.  The following portions of the patient's history were reviewed and updated as appropriate: allergies, current medications, past family history, past medical history, past social history, past surgical history and problem list.  Review of Systems Pertinent items are noted in HPI.    Objective:    BP 140/82  Pulse 79  Temp(Src) 98.9 F (37.2 C) (Oral)  Wt 254 lb 12.8 oz (115.577 kg)  SpO2 98% General appearance: alert, cooperative, appears stated age and no distress Ears: normal TM's and external ear canals both ears Nose: Nares normal. Septum midline. Mucosa normal. No drainage or sinus tenderness. Throat: abnormal findings: mild oropharyngeal erythema Neck: mild anterior cervical adenopathy, supple, symmetrical, trachea midline and thyroid not enlarged, symmetric, no tenderness/mass/nodules Lungs: clear to auscultation bilaterally  Laboratory Strep test done. Results:negative.    Assessment:    Acute pharyngitis, likely  pharyngitis  r/o strep.    Plan:    Patient placed on antibiotics. Use of OTC analgesics recommended as well as salt water gargles. Follow up as needed.

## 2011-12-24 ENCOUNTER — Telehealth: Payer: Self-pay | Admitting: *Deleted

## 2011-12-24 MED ORDER — LEVOTHYROXINE SODIUM 75 MCG PO TABS: 75.0000 ug | ORAL_TABLET | Freq: Every day | ORAL | Status: DC

## 2011-12-24 MED ORDER — FUROSEMIDE 20 MG PO TABS: 20.0000 mg | ORAL_TABLET | Freq: Every day | ORAL | Status: DC

## 2011-12-24 NOTE — Telephone Encounter (Signed)
Pt states that she has always take lasix 40 qd but directions now stateTake 1 tablet (20 mg total) by mouth every other day. Please clarify med instruction

## 2011-12-24 NOTE — Telephone Encounter (Signed)
Discuss with patient, Rx sent with updated info.

## 2011-12-24 NOTE — Telephone Encounter (Signed)
Reviewed pt's chart in both [facility] and [facility].  According to chart, it appears she should be taking 20mg  1 tab daily.  Not sure how or when this got switched to every other day.

## 2012-01-16 ENCOUNTER — Ambulatory Visit (INDEPENDENT_AMBULATORY_CARE_PROVIDER_SITE_OTHER): Payer: Medicare Other | Admitting: Family Medicine

## 2012-01-16 ENCOUNTER — Encounter: Payer: Self-pay | Admitting: *Deleted

## 2012-01-16 ENCOUNTER — Encounter: Payer: Self-pay | Admitting: Family Medicine

## 2012-01-16 VITALS — BP 138/90 | HR 62 | Temp 98.3°F | Ht 64.75 in | Wt 249.2 lb

## 2012-01-16 DIAGNOSIS — E785 Hyperlipidemia, unspecified: Secondary | ICD-10-CM | POA: Diagnosis not present

## 2012-01-16 DIAGNOSIS — M79609 Pain in unspecified limb: Secondary | ICD-10-CM | POA: Diagnosis not present

## 2012-01-16 DIAGNOSIS — I10 Essential (primary) hypertension: Secondary | ICD-10-CM

## 2012-01-16 DIAGNOSIS — M79629 Pain in unspecified upper arm: Secondary | ICD-10-CM

## 2012-01-16 LAB — BASIC METABOLIC PANEL
CO2: 27 mEq/L (ref 19–32)
Calcium: 9 mg/dL (ref 8.4–10.5)
Creatinine, Ser: 1.1 mg/dL (ref 0.4–1.2)
Glucose, Bld: 109 mg/dL — ABNORMAL HIGH (ref 70–99)

## 2012-01-16 LAB — HEPATIC FUNCTION PANEL
Albumin: 4.1 g/dL (ref 3.5–5.2)
Bilirubin, Direct: 0 mg/dL (ref 0.0–0.3)
Total Protein: 7.4 g/dL (ref 6.0–8.3)

## 2012-01-16 LAB — LIPID PANEL: Total CHOL/HDL Ratio: 3

## 2012-01-16 MED ORDER — AMLODIPINE-OLMESARTAN 5-40 MG PO TABS: 1.0000 | ORAL_TABLET | Freq: Every day | ORAL | Status: DC

## 2012-01-16 NOTE — Patient Instructions (Signed)
Follow up in 1 month to recheck BP STOP the Losartan and the Amlodipine START the Azor samples daily We'll notify you of your lab results Call with any questions or concerns Happy Spring!!

## 2012-01-16 NOTE — Progress Notes (Signed)
  Subjective:    Patient ID: Susan Pittman, female    DOB: 1941/10/28, 71 y.o.   MRN: [id number]  HPI HTN- chronic problem.  Fair control.  On Amlodipine, Coreg, Cozaar, Lasix.  No CP, SOB, HAs, visual changes, edema.  Has not been checking BP regularly but does recall a time that it was 175/102.  Hyperlipidemia- chronic problem.  Has been attempting to control w/ diet and exercise.  Not currently on meds.  R shoulder/arm pain- sxs started 2-3 weeks ago.  Thinks she injured it during water aerobics.  Pain is over upper arm/tricep when rubbing.  Denies pain over shoulder joint.   Review of Systems For ROS see HPI     Objective:   Physical Exam  Vitals reviewed. Constitutional: She is oriented to person, place, and time. She appears well-developed and well-nourished. No distress.  HENT:  Head: Normocephalic and atraumatic.  Eyes: Conjunctivae and EOM are normal. Pupils are equal, round, and reactive to light.  Neck: Normal range of motion. Neck supple. No thyromegaly present.  Cardiovascular: Normal rate, regular rhythm, normal heart sounds and intact distal pulses.   No murmur heard. Pulmonary/Chest: Effort normal and breath sounds normal. No respiratory distress.  Abdominal: Soft. She exhibits no distension. There is no tenderness.  Musculoskeletal: She exhibits no edema.       Right upper arm: She exhibits tenderness (over tricep and upper arm but normal ROM of shoulder and elbow). She exhibits no bony tenderness, no swelling, no edema, no deformity and no laceration.  Lymphadenopathy:    She has no cervical adenopathy.  Neurological: She is alert and oriented to person, place, and time.  Skin: Skin is warm and dry.  Psychiatric: She has a normal mood and affect. Her behavior is normal.          Assessment & Plan:

## 2012-01-26 DIAGNOSIS — M79629 Pain in unspecified upper arm: Secondary | ICD-10-CM | POA: Insufficient documentation

## 2012-01-26 NOTE — Assessment & Plan Note (Signed)
Chronic problem.  Has been attempting to control w/ diet and exercise.  Has not been on meds.  Due for labs.  Start meds prn.

## 2012-01-26 NOTE — Assessment & Plan Note (Signed)
New.  sxs consistent w/ muscle strain.  No bony tenderness.  NSAIDs/tylenol prn.  Reviewed supportive care and red flags that should prompt return.  Pt expressed understanding and is in agreement w/ plan.

## 2012-01-26 NOTE — Assessment & Plan Note (Signed)
Chronic problem.  Not well controlled.  Pt previously on Benicar w/ good results- switched to Cozaar due to insurance.  This is not controlling BP.  Switch to Azor- samples given.  Reviewed supportive care and red flags that should prompt return.  Pt expressed understanding and is in agreement w/ plan.

## 2012-02-12 ENCOUNTER — Encounter: Payer: Self-pay | Admitting: Family Medicine

## 2012-02-12 ENCOUNTER — Ambulatory Visit (INDEPENDENT_AMBULATORY_CARE_PROVIDER_SITE_OTHER): Payer: Medicare Other | Admitting: Family Medicine

## 2012-02-12 VITALS — BP 136/84 | HR 68 | Temp 97.3°F | Ht 64.0 in | Wt 250.0 lb

## 2012-02-12 DIAGNOSIS — I10 Essential (primary) hypertension: Secondary | ICD-10-CM

## 2012-02-12 DIAGNOSIS — J45909 Unspecified asthma, uncomplicated: Secondary | ICD-10-CM | POA: Diagnosis not present

## 2012-02-12 LAB — BASIC METABOLIC PANEL
CO2: 27 mEq/L (ref 19–32)
Chloride: 105 mEq/L (ref 96–112)
Potassium: 3.8 mEq/L (ref 3.5–5.1)
Sodium: 142 mEq/L (ref 135–145)

## 2012-02-12 MED ORDER — AMLODIPINE-OLMESARTAN 5-40 MG PO TABS: 1.0000 | ORAL_TABLET | Freq: Every day | ORAL | Status: DC

## 2012-02-12 MED ORDER — FLUTICASONE PROPIONATE 50 MCG/ACT NA SUSP: 2.0000 | Freq: Every day | NASAL | Status: DC

## 2012-02-12 MED ORDER — ALBUTEROL SULFATE HFA 108 (90 BASE) MCG/ACT IN AERS: 2.0000 | INHALATION_SPRAY | Freq: Four times a day (QID) | RESPIRATORY_TRACT | Status: DC | PRN

## 2012-02-12 NOTE — Assessment & Plan Note (Signed)
Unchanged.  Fair control.  Asymptomatic.  No changes.  Check labs.

## 2012-02-12 NOTE — Assessment & Plan Note (Signed)
Deteriorated.  Pt's trigger is likely seasonal allergies, increased humidity in the indoor pool, chlorine.  Start albuterol prn.  Reviewed supportive care and red flags that should prompt return.  Pt expressed understanding and is in agreement w/ plan.

## 2012-02-12 NOTE — Patient Instructions (Signed)
We'll notify you of your lab results Keep up the good work! Use the albuterol as needed for shortness of breath/wheezing Add Claritin or Zyrtec daily Call with any questions or concerns Happy Spring!!!

## 2012-02-12 NOTE — Progress Notes (Signed)
  Subjective:    Patient ID: Susan Pittman, female    DOB: 1941-08-06, 71 y.o.   MRN: [id number]  HPI HTN- was switched from Cozaar to Azor at last visit.  Reports feeling 'real good'.  Ringing in ears has stopped.  Not checking BP at home.  Denies CP, SOB, HAs, visual changes, edema.  Asthma- pt reports she has had increased SOB w/ water aerobics.  Stopped all meds.  Reports she is again having allergy sxs.  Only having difficulty in the pool.   Review of Systems For ROS see HPI     Objective:   Physical Exam  Vitals reviewed. Constitutional: She is oriented to person, place, and time. She appears well-developed and well-nourished. No distress.  HENT:  Head: Normocephalic and atraumatic.  Eyes: Conjunctivae and EOM are normal. Pupils are equal, round, and reactive to light.  Neck: Normal range of motion. Neck supple. No thyromegaly present.  Cardiovascular: Normal rate, regular rhythm, normal heart sounds and intact distal pulses.   No murmur heard. Pulmonary/Chest: Effort normal and breath sounds normal. No respiratory distress.  Abdominal: Soft. She exhibits no distension. There is no tenderness.  Musculoskeletal: She exhibits no edema.  Lymphadenopathy:    She has no cervical adenopathy.  Neurological: She is alert and oriented to person, place, and time.  Skin: Skin is warm and dry.  Psychiatric: She has a normal mood and affect. Her behavior is normal.          Assessment & Plan:

## 2012-02-13 ENCOUNTER — Encounter: Payer: Self-pay | Admitting: *Deleted

## 2012-02-13 ENCOUNTER — Other Ambulatory Visit: Payer: Self-pay | Admitting: Family Medicine

## 2012-02-13 MED ORDER — CARVEDILOL 12.5 MG PO TABS: 12.5000 mg | ORAL_TABLET | Freq: Two times a day (BID) | ORAL | Status: DC

## 2012-02-13 NOTE — Telephone Encounter (Signed)
rx sent to pharmacy by e-script  

## 2012-02-13 NOTE — Telephone Encounter (Signed)
Refill Carvedilol 12.5MG  Tablets Qty 60 Take 1-tablet by mouth twice daily with food Last filled 3.13.13

## 2012-02-20 ENCOUNTER — Telehealth: Payer: Self-pay | Admitting: *Deleted

## 2012-02-20 NOTE — Telephone Encounter (Signed)
Prior Auth approved through 11-03-12 under your medicare Part D benefits, pharmacy notified via fax.

## 2012-03-11 ENCOUNTER — Ambulatory Visit (INDEPENDENT_AMBULATORY_CARE_PROVIDER_SITE_OTHER): Payer: Medicare Other | Admitting: Family Medicine

## 2012-03-11 ENCOUNTER — Encounter: Payer: Self-pay | Admitting: Family Medicine

## 2012-03-11 VITALS — BP 127/80 | HR 60 | Temp 98.1°F | Ht 64.0 in | Wt 246.6 lb

## 2012-03-11 DIAGNOSIS — M25519 Pain in unspecified shoulder: Secondary | ICD-10-CM

## 2012-03-11 MED ORDER — FUROSEMIDE 20 MG PO TABS: 20.0000 mg | ORAL_TABLET | Freq: Every day | ORAL | Status: DC

## 2012-03-11 NOTE — Patient Instructions (Signed)
We'll call you with your ortho appt Continue the Etodolac Schedule your mammo and bone density for after 8/14 Call with any questions or concerns Hang in there!!!

## 2012-03-11 NOTE — Assessment & Plan Note (Signed)
New.  Pt w/out signs of impingement.  Already on scheduled NSAIDs.  Pain w/ abduction.  Refer to ortho for complete evaluation and management.  Pt expressed understanding and is in agreement w/ plan.

## 2012-03-11 NOTE — Progress Notes (Signed)
  Subjective:    Patient ID: Susan Pittman, female    DOB: 1941-07-07, 71 y.o.   MRN: [id number]  HPI Shoulder pain- R, sxs intermittently x3-4 yrs, particularly in the winter and during water aerobics.  This time pain is more persistent.  Pain is not consistent in location.  Pain improves w/ etodolac.  Pain w/ abduction.  L handed.  Pain worse w/ movement.   Review of Systems For ROS see HPI     Objective:   Physical Exam  Vitals reviewed. Constitutional: She appears well-developed and well-nourished. No distress.  Musculoskeletal:       Right shoulder: She exhibits decreased range of motion (limited abduction, ~75 degrees), tenderness (over glenohumeral joint) and pain. She exhibits no bony tenderness, no swelling, no effusion, no crepitus, no spasm, normal pulse and normal strength.          Assessment & Plan:

## 2012-03-12 DIAGNOSIS — M25519 Pain in unspecified shoulder: Secondary | ICD-10-CM | POA: Diagnosis not present

## 2012-03-18 ENCOUNTER — Ambulatory Visit: Payer: Medicare Other | Attending: Orthopedic Surgery | Admitting: Physical Therapy

## 2012-03-18 DIAGNOSIS — M25519 Pain in unspecified shoulder: Secondary | ICD-10-CM | POA: Insufficient documentation

## 2012-03-18 DIAGNOSIS — IMO0001 Reserved for inherently not codable concepts without codable children: Secondary | ICD-10-CM | POA: Diagnosis not present

## 2012-03-24 ENCOUNTER — Ambulatory Visit: Payer: Medicare Other

## 2012-04-01 ENCOUNTER — Ambulatory Visit: Payer: Medicare Other

## 2012-04-02 ENCOUNTER — Other Ambulatory Visit: Payer: Self-pay | Admitting: Family Medicine

## 2012-04-02 DIAGNOSIS — M25519 Pain in unspecified shoulder: Secondary | ICD-10-CM | POA: Diagnosis not present

## 2012-04-02 MED ORDER — LEVOTHYROXINE SODIUM 75 MCG PO TABS: 75.0000 ug | ORAL_TABLET | Freq: Every day | ORAL | Status: DC

## 2012-04-02 NOTE — Telephone Encounter (Signed)
Ok to refill x6- will repeat at CPE

## 2012-04-02 NOTE — Telephone Encounter (Signed)
Sent rx to [facility] per incoming fax via escribe

## 2012-04-02 NOTE — Telephone Encounter (Signed)
Refill Synthroid Tab Qty 30 Take one tablet by mouth every day  Last filled 7.6.12 Last OV 5.8.13

## 2012-04-02 NOTE — Telephone Encounter (Signed)
Noted pt came in for follow up on 02-12-12 per CPE 07-2011 however did not see TSH in recent labs, please advise to send RX request/when to have labs checked?

## 2012-04-07 ENCOUNTER — Ambulatory Visit: Payer: Medicare Other | Admitting: Physical Therapy

## 2012-04-08 ENCOUNTER — Ambulatory Visit: Payer: Medicare Other | Attending: Orthopedic Surgery

## 2012-04-08 DIAGNOSIS — IMO0001 Reserved for inherently not codable concepts without codable children: Secondary | ICD-10-CM | POA: Insufficient documentation

## 2012-04-08 DIAGNOSIS — M25519 Pain in unspecified shoulder: Secondary | ICD-10-CM | POA: Diagnosis not present

## 2012-04-09 ENCOUNTER — Ambulatory Visit: Payer: Medicare Other | Admitting: Physical Therapy

## 2012-04-09 DIAGNOSIS — IMO0001 Reserved for inherently not codable concepts without codable children: Secondary | ICD-10-CM | POA: Diagnosis not present

## 2012-04-09 DIAGNOSIS — M25519 Pain in unspecified shoulder: Secondary | ICD-10-CM | POA: Diagnosis not present

## 2012-04-14 ENCOUNTER — Ambulatory Visit: Payer: Medicare Other | Admitting: Physical Therapy

## 2012-04-14 DIAGNOSIS — M25519 Pain in unspecified shoulder: Secondary | ICD-10-CM | POA: Diagnosis not present

## 2012-04-14 DIAGNOSIS — IMO0001 Reserved for inherently not codable concepts without codable children: Secondary | ICD-10-CM | POA: Diagnosis not present

## 2012-04-15 ENCOUNTER — Ambulatory Visit: Payer: Medicare Other | Admitting: Physical Therapy

## 2012-04-15 DIAGNOSIS — M25519 Pain in unspecified shoulder: Secondary | ICD-10-CM | POA: Diagnosis not present

## 2012-04-15 DIAGNOSIS — IMO0001 Reserved for inherently not codable concepts without codable children: Secondary | ICD-10-CM | POA: Diagnosis not present

## 2012-08-10 ENCOUNTER — Other Ambulatory Visit: Payer: Self-pay | Admitting: Family Medicine

## 2012-08-10 DIAGNOSIS — I10 Essential (primary) hypertension: Secondary | ICD-10-CM

## 2012-08-10 DIAGNOSIS — Z23 Encounter for immunization: Secondary | ICD-10-CM | POA: Diagnosis not present

## 2012-08-10 MED ORDER — AMLODIPINE-OLMESARTAN 5-40 MG PO TABS: 1.0000 | ORAL_TABLET | Freq: Every day | ORAL | Status: DC

## 2012-08-10 MED ORDER — FLUTICASONE PROPIONATE 50 MCG/ACT NA SUSP: 2.0000 | Freq: Every day | NASAL | Status: DC

## 2012-08-10 MED ORDER — FUROSEMIDE 20 MG PO TABS: 20.0000 mg | ORAL_TABLET | Freq: Every day | ORAL | Status: DC

## 2012-08-10 NOTE — Telephone Encounter (Signed)
Refills x 3 LAST OV 5.8.13 acute  1-AZOR 5-40 MG Take 1 tablet by mouth daily #30 last fill 7.12.13  2-FLONASE 50 MCG/ACT use one spray into each nostril every day #16 last fill 6.6.13  3-Furosemide (Tab) 20 MG Take 1 tablet (20 mg total) by mouth daily #90 last fill 5.8.13

## 2012-08-10 NOTE — Telephone Encounter (Signed)
rx sent to pharmacy by e-script to last pt until upcoming OV

## 2012-08-26 DIAGNOSIS — D239 Other benign neoplasm of skin, unspecified: Secondary | ICD-10-CM | POA: Diagnosis not present

## 2012-08-26 DIAGNOSIS — Z85828 Personal history of other malignant neoplasm of skin: Secondary | ICD-10-CM | POA: Diagnosis not present

## 2012-08-26 DIAGNOSIS — L570 Actinic keratosis: Secondary | ICD-10-CM | POA: Diagnosis not present

## 2012-08-26 DIAGNOSIS — D485 Neoplasm of uncertain behavior of skin: Secondary | ICD-10-CM | POA: Diagnosis not present

## 2012-09-09 ENCOUNTER — Ambulatory Visit: Payer: Medicare Other | Admitting: Family Medicine

## 2012-09-10 ENCOUNTER — Ambulatory Visit (INDEPENDENT_AMBULATORY_CARE_PROVIDER_SITE_OTHER): Payer: Medicare Other | Admitting: Family Medicine

## 2012-09-10 ENCOUNTER — Encounter: Payer: Self-pay | Admitting: Family Medicine

## 2012-09-10 VITALS — BP 128/86 | HR 69 | Temp 98.3°F | Resp 16 | Ht 64.25 in | Wt 247.5 lb

## 2012-09-10 DIAGNOSIS — E039 Hypothyroidism, unspecified: Secondary | ICD-10-CM

## 2012-09-10 DIAGNOSIS — E785 Hyperlipidemia, unspecified: Secondary | ICD-10-CM

## 2012-09-10 DIAGNOSIS — M899 Disorder of bone, unspecified: Secondary | ICD-10-CM | POA: Diagnosis not present

## 2012-09-10 DIAGNOSIS — Z0000 Encounter for general adult medical examination without abnormal findings: Secondary | ICD-10-CM | POA: Diagnosis not present

## 2012-09-10 DIAGNOSIS — M949 Disorder of cartilage, unspecified: Secondary | ICD-10-CM

## 2012-09-10 DIAGNOSIS — I10 Essential (primary) hypertension: Secondary | ICD-10-CM | POA: Diagnosis not present

## 2012-09-10 NOTE — Patient Instructions (Addendum)
We'll determine your follow up based on thyroid and what we do w/ the BP meds We'll notify you of your lab results The order is in for [facility] for both mammo and bone density- you can schedule at your convenience Keep up the good work!  You look great! Call with any questions or concerns Happy Fall!!!

## 2012-09-10 NOTE — Progress Notes (Signed)
  Subjective:    Patient ID: Susan Pittman, female    DOB: [birth year], 71 y.o.   MRN: [id number]  HPI Here today for CPE.  Risk Factors: Hyperlipidemia- chronic problem, has been attempting to control w/ diet.  Not currently on meds. HTN- chronic problem, adequate control on current meds.  Reports less swelling on Azor but more ringing in the ears.  Pt concerned about hair loss from beta blocker.  Would like to wean off coreg.  Not requiring lasix regularly. Hypothyroid- chronic problem, on synthroid but not taking it daily.  Pt reports increased hair loss recently.  Denies fatigue. Physical Activity: walking regularly, going to the gym Fall Risk: low Depression: none Hearing: normal to conversational tones, mildly decreased to whispered voice ADL's: independent Cognitive: normal linear thought process, memory and attention intact Home Safety: safe at home Height, Weight, BMI, Visual Acuity: see vitals, vision corrected to 20/20 w/ glasses Counseling: due for mammo and DEXA.  Colonoscopy done 3-4 yrs ago w/ Dr Loreta Ave Labs Ordered: See A&P Care Plan: See A&P    Review of Systems Patient reports no vision/ hearing changes, adenopathy,fever, weight change,  persistant/recurrent hoarseness , swallowing issues, chest pain, palpitations, edema, persistant/recurrent cough, hemoptysis, dyspnea (rest/exertional/paroxysmal nocturnal), gastrointestinal bleeding (melena, rectal bleeding), abdominal pain, significant heartburn, bowel changes, GU symptoms (dysuria, hematuria, incontinence), Gyn symptoms (abnormal  bleeding, pain),  syncope, focal weakness, memory loss, numbness & tingling, skin/nail changes, abnormal bruising or bleeding, anxiety, or depression.   + hair loss    Objective:   Physical Exam General Appearance:    Alert, cooperative, no distress, appears stated age  Head:    Normocephalic, without obvious abnormality, atraumatic  Eyes:    PERRL, conjunctiva/corneas clear, EOM's intact,  fundi    benign, both eyes  Ears:    Normal TM's and external ear canals, both ears  Nose:   Nares normal, septum midline, mucosa normal, no drainage    or sinus tenderness  Throat:   Lips, mucosa, and tongue normal; teeth and gums normal  Neck:   Supple, symmetrical, trachea midline, no adenopathy;    Thyroid: no enlargement/tenderness/nodules  Back:     Symmetric, no curvature, ROM normal, no CVA tenderness  Lungs:     Clear to auscultation bilaterally, respirations unlabored  Chest Wall:    No tenderness or deformity   Heart:    Regular rate and rhythm, S1 and S2 normal, no murmur, rub   or gallop  Breast Exam:    Deferred to mammo  Abdomen:     Soft, non-tender, bowel sounds active all four quadrants,    no masses, no organomegaly  Genitalia:    Deferred due to hysterectomy  Rectal:    Extremities:   Extremities normal, atraumatic, no cyanosis or edema  Pulses:   2+ and symmetric all extremities  Skin:   Skin color, texture, turgor normal, no rashes or lesions  Lymph nodes:   Cervical, supraclavicular, and axillary nodes normal  Neurologic:   CNII-XII intact, normal strength, sensation and reflexes    throughout          Assessment & Plan:

## 2012-09-11 LAB — CBC WITH DIFFERENTIAL/PLATELET
Basophils Absolute: 0.1 10*3/uL (ref 0.0–0.1)
HCT: 42.5 % (ref 36.0–46.0)
Lymphs Abs: 2 10*3/uL (ref 0.7–4.0)
Monocytes Relative: 5.6 % (ref 3.0–12.0)
Neutrophils Relative %: 67 % (ref 43.0–77.0)
Platelets: 277 10*3/uL (ref 150.0–400.0)
RDW: 15.1 % — ABNORMAL HIGH (ref 11.5–14.6)
WBC: 8.4 10*3/uL (ref 4.5–10.5)

## 2012-09-11 LAB — HEPATIC FUNCTION PANEL
AST: 23 U/L (ref 0–37)
Alkaline Phosphatase: 87 U/L (ref 39–117)
Bilirubin, Direct: 0.1 mg/dL (ref 0.0–0.3)
Total Protein: 7.5 g/dL (ref 6.0–8.3)

## 2012-09-11 LAB — BASIC METABOLIC PANEL
CO2: 28 mEq/L (ref 19–32)
Calcium: 9.1 mg/dL (ref 8.4–10.5)
GFR: 50.05 mL/min — ABNORMAL LOW (ref >60.00)
Potassium: 4.5 mEq/L (ref 3.5–5.1)
Sodium: 138 mEq/L (ref 135–145)

## 2012-09-11 LAB — LIPID PANEL
HDL: 54.4 mg/dL (ref >39.00)
Total CHOL/HDL Ratio: 4
Triglycerides: 114 mg/dL (ref 0.0–149.0)

## 2012-09-13 NOTE — Assessment & Plan Note (Signed)
Chronic problem.  Refer for DEXA.  Check Vit D.

## 2012-09-13 NOTE — Assessment & Plan Note (Signed)
Chronic problem.  Pt is asymptomatic w/ exception of hair loss.  Check labs.  Adjust meds prn.

## 2012-09-13 NOTE — Assessment & Plan Note (Signed)
Chronic problem.  Not currently on meds b/c she is trying to manage w/ diet and exercise.  Check labs.  Start meds prn.

## 2012-09-13 NOTE — Assessment & Plan Note (Signed)
Pt's PE WNL w/ exception of being overweight.  UTD on colonoscopy.  Due for mammo and DEXA.  Check labs.  Anticipatory guidance provided.

## 2012-09-13 NOTE — Assessment & Plan Note (Signed)
Chronic problem.  Adequate control but pt would like to wean off beta blocker due to hair loss.  Will 1st check TSH and make sure this isn't cause of hair loss.  If TSH normal, will attempt to wean Coreg but will need close BP control.  Pt expressed understanding and is in agreement w/ plan.

## 2012-09-15 LAB — VITAMIN D 1,25 DIHYDROXY: Vitamin D 1, 25 (OH)2 Total: 59 pg/mL (ref 18–72)

## 2012-09-16 ENCOUNTER — Other Ambulatory Visit: Payer: Self-pay | Admitting: Family Medicine

## 2012-09-16 MED ORDER — ETODOLAC 400 MG PO TABS: ORAL_TABLET | ORAL | Status: DC

## 2012-09-16 NOTE — Telephone Encounter (Signed)
Ok for #120, 3 refills 

## 2012-09-16 NOTE — Telephone Encounter (Signed)
Rx sent 

## 2012-09-16 NOTE — Telephone Encounter (Signed)
Last OV 09-10-12, last filled 09-18-11 #120 3

## 2012-09-16 NOTE — Telephone Encounter (Signed)
refill ERODOLAC 400mg  Tab #120 Take one tablet by mouth twice daily last fill 8.14.13--last ov 11.7.13 V70.9

## 2012-09-17 ENCOUNTER — Other Ambulatory Visit: Payer: Self-pay | Admitting: Family Medicine

## 2012-09-17 DIAGNOSIS — I10 Essential (primary) hypertension: Secondary | ICD-10-CM

## 2012-09-17 MED ORDER — LEVOTHYROXINE SODIUM 75 MCG PO TABS: 75.0000 ug | ORAL_TABLET | Freq: Every day | ORAL | Status: DC

## 2012-09-17 MED ORDER — ETODOLAC 400 MG PO TABS: ORAL_TABLET | ORAL | Status: DC

## 2012-09-17 MED ORDER — CARVEDILOL 12.5 MG PO TABS: 12.5000 mg | ORAL_TABLET | Freq: Two times a day (BID) | ORAL | Status: DC

## 2012-09-17 MED ORDER — FUROSEMIDE 20 MG PO TABS: 20.0000 mg | ORAL_TABLET | Freq: Every day | ORAL | Status: DC

## 2012-09-17 MED ORDER — AMLODIPINE-OLMESARTAN 5-40 MG PO TABS: 1.0000 | ORAL_TABLET | Freq: Every day | ORAL | Status: DC

## 2012-09-22 DIAGNOSIS — H2510 Age-related nuclear cataract, unspecified eye: Secondary | ICD-10-CM | POA: Diagnosis not present

## 2012-10-08 DIAGNOSIS — Z803 Family history of malignant neoplasm of breast: Secondary | ICD-10-CM | POA: Diagnosis not present

## 2012-10-08 DIAGNOSIS — M899 Disorder of bone, unspecified: Secondary | ICD-10-CM | POA: Diagnosis not present

## 2012-10-08 DIAGNOSIS — M949 Disorder of cartilage, unspecified: Secondary | ICD-10-CM | POA: Diagnosis not present

## 2012-10-08 DIAGNOSIS — Z1231 Encounter for screening mammogram for malignant neoplasm of breast: Secondary | ICD-10-CM | POA: Diagnosis not present

## 2012-10-29 ENCOUNTER — Telehealth: Payer: Self-pay | Admitting: *Deleted

## 2012-10-29 NOTE — Telephone Encounter (Signed)
Per report Pt osteopenia cont calcium 1200 + vit d 800 (2 Caltrate). Discuss with patient.

## 2012-11-10 ENCOUNTER — Telehealth: Payer: Self-pay | Admitting: Family Medicine

## 2012-11-10 DIAGNOSIS — I10 Essential (primary) hypertension: Secondary | ICD-10-CM

## 2012-11-10 NOTE — Telephone Encounter (Signed)
Refill: Azor 5-40 mg tab #30. Take one tablet by mouth every day. Last fill 10-21-12 Lodine 400 mg tab #120. Take two tablets by mouth twice daily in the morning and in the evening. Last fill 11--13-12

## 2012-11-11 MED ORDER — ETODOLAC 400 MG PO TABS: ORAL_TABLET | ORAL | Status: DC

## 2012-11-11 MED ORDER — AMLODIPINE-OLMESARTAN 5-40 MG PO TABS: 1.0000 | ORAL_TABLET | Freq: Every day | ORAL | Status: DC

## 2012-11-19 ENCOUNTER — Encounter: Payer: Self-pay | Admitting: Family Medicine

## 2013-02-08 DIAGNOSIS — H02409 Unspecified ptosis of unspecified eyelid: Secondary | ICD-10-CM | POA: Diagnosis not present

## 2013-02-08 DIAGNOSIS — H02839 Dermatochalasis of unspecified eye, unspecified eyelid: Secondary | ICD-10-CM | POA: Diagnosis not present

## 2013-02-08 DIAGNOSIS — H01003 Unspecified blepharitis right eye, unspecified eyelid: Secondary | ICD-10-CM | POA: Insufficient documentation

## 2013-02-08 DIAGNOSIS — H02429 Myogenic ptosis of unspecified eyelid: Secondary | ICD-10-CM | POA: Diagnosis not present

## 2013-02-08 DIAGNOSIS — H5340 Unspecified visual field defects: Secondary | ICD-10-CM | POA: Diagnosis not present

## 2013-02-11 ENCOUNTER — Encounter: Payer: Self-pay | Admitting: Family Medicine

## 2013-02-11 ENCOUNTER — Ambulatory Visit (INDEPENDENT_AMBULATORY_CARE_PROVIDER_SITE_OTHER): Payer: Medicare Other | Admitting: Family Medicine

## 2013-02-11 VITALS — BP 168/80 | HR 59 | Temp 98.0°F | Ht 65.0 in | Wt 249.6 lb

## 2013-02-11 DIAGNOSIS — R062 Wheezing: Secondary | ICD-10-CM

## 2013-02-11 DIAGNOSIS — J309 Allergic rhinitis, unspecified: Secondary | ICD-10-CM | POA: Diagnosis not present

## 2013-02-11 DIAGNOSIS — J209 Acute bronchitis, unspecified: Secondary | ICD-10-CM | POA: Insufficient documentation

## 2013-02-11 DIAGNOSIS — J45909 Unspecified asthma, uncomplicated: Secondary | ICD-10-CM

## 2013-02-11 DIAGNOSIS — J329 Chronic sinusitis, unspecified: Secondary | ICD-10-CM | POA: Diagnosis not present

## 2013-02-11 MED ORDER — IPRATROPIUM BROMIDE 0.02 % IN SOLN
0.5000 mg | Freq: Once | RESPIRATORY_TRACT | Status: AC
  Administered 2013-02-11: 0.5 mg via RESPIRATORY_TRACT

## 2013-02-11 MED ORDER — AZITHROMYCIN 250 MG PO TABS: ORAL_TABLET | ORAL | Status: DC

## 2013-02-11 MED ORDER — ALBUTEROL SULFATE (5 MG/ML) 0.5% IN NEBU
2.5000 mg | INHALATION_SOLUTION | Freq: Once | RESPIRATORY_TRACT | Status: AC
  Administered 2013-02-11: 2.5 mg via RESPIRATORY_TRACT

## 2013-02-11 NOTE — Patient Instructions (Addendum)
This is a combo of allergies, asthma, and bronchitis Start the Zpack as directed Restart the Advair while not feeling well Use the Albuterol as needed for cough/wheezing Mucinex DM to thin congestion and help w/ cough Drink plenty of fluids REST! Hang in there! Good luck w/ surgery!

## 2013-02-11 NOTE — Progress Notes (Signed)
  Subjective:    Patient ID: Susan Pittman, female    DOB: Mar 15, 1941, 72 y.o.   MRN: [id number]  HPI Asthma- spent Friday and Saturday outdoors.  Asthma is frequently allergy triggered.  Started w/ cough on Sunday, worsened Monday.  Increased wheezing, SOB.  Cough is productive- now painful.  Subjective fever.  No sinus pain/pressure.  Using saline rinses.  Using Flonase, Zyrtec daily.  Not using Advair currently- ran out.  Has not been using Albuterol   Review of Systems For ROS see HPI     Objective:   Physical Exam  Vitals reviewed. Constitutional: She appears well-developed and well-nourished. No distress.  HENT:  Head: Normocephalic and atraumatic.  Right Ear: Tympanic membrane normal.  Left Ear: Tympanic membrane normal.  Nose: Mucosal edema and rhinorrhea present. Right sinus exhibits no maxillary sinus tenderness and no frontal sinus tenderness. Left sinus exhibits no maxillary sinus tenderness and no frontal sinus tenderness.  Mouth/Throat: Mucous membranes are normal. Posterior oropharyngeal erythema (w/ PND) present.  Eyes: Conjunctivae and EOM are normal. Pupils are equal, round, and reactive to light.  Neck: Normal range of motion. Neck supple.  Cardiovascular: Normal rate, regular rhythm and normal heart sounds.   Pulmonary/Chest: Effort normal. No respiratory distress. She has wheezes (diffuse end expiratory wheezes). She has no rales.  Wet, hacking cough  Lymphadenopathy:    She has no cervical adenopathy.          Assessment & Plan:

## 2013-02-22 NOTE — Assessment & Plan Note (Signed)
New.  Due to wet, hacking cough along w/ underlying asthma will start zpack.  Reviewed supportive care and red flags that should prompt return.  Pt expressed understanding and is in agreement w/ plan.

## 2013-02-22 NOTE — Assessment & Plan Note (Signed)
Deteriorated.  Pt to use antihistamine daily as well as nasal steroid.  Pt expressed understanding and is in agreement w/ plan.

## 2013-02-22 NOTE — Assessment & Plan Note (Signed)
Deteriorated.  Likely allergy triggered.  Restart Advair while not feeling well.  Albuterol prn.  Reviewed supportive care and red flags that should prompt return.  Pt expressed understanding and is in agreement w/ plan.

## 2013-02-23 ENCOUNTER — Ambulatory Visit (HOSPITAL_BASED_OUTPATIENT_CLINIC_OR_DEPARTMENT_OTHER)
Admission: RE | Admit: 2013-02-23 | Discharge: 2013-02-23 | Disposition: A | Discharge status: Home / Self Care | Payer: Medicare Other | Source: Ambulatory Visit | Attending: Family Medicine | Admitting: Family Medicine

## 2013-02-23 ENCOUNTER — Encounter: Payer: Self-pay | Admitting: Family Medicine

## 2013-02-23 ENCOUNTER — Ambulatory Visit (INDEPENDENT_AMBULATORY_CARE_PROVIDER_SITE_OTHER): Payer: Medicare Other | Admitting: Family Medicine

## 2013-02-23 VITALS — BP 150/90 | HR 65 | Temp 98.1°F | Wt 247.4 lb

## 2013-02-23 DIAGNOSIS — M79609 Pain in unspecified limb: Secondary | ICD-10-CM

## 2013-02-23 DIAGNOSIS — M79662 Pain in left lower leg: Secondary | ICD-10-CM

## 2013-02-23 DIAGNOSIS — I10 Essential (primary) hypertension: Secondary | ICD-10-CM

## 2013-02-23 MED ORDER — AMLODIPINE-OLMESARTAN 10-20 MG PO TABS: 1.0000 | ORAL_TABLET | Freq: Every day | ORAL | Status: DC

## 2013-02-23 MED ORDER — CARVEDILOL 25 MG PO TABS: 25.0000 mg | ORAL_TABLET | Freq: Two times a day (BID) | ORAL | Status: DC

## 2013-02-23 NOTE — Patient Instructions (Addendum)

## 2013-02-24 ENCOUNTER — Encounter: Payer: Self-pay | Admitting: Family Medicine

## 2013-02-24 NOTE — Progress Notes (Signed)
  Subjective:    Patient ID: Susan Pittman, female    DOB: 1941-08-06, 72 y.o.   MRN: [id number]  HPI   Pt here c/ R knee pain that radiates from back of knee to thgh.  It started when she had to stop lodine because of her cataract surgery.no  No new symptoms Review of Systems    as above  Objective:   Physical Exam  .BP 150/90  Pulse 65  Temp(Src) 98.1 F (36.7 C) (Oral)  Wt 247 lb 6.4 oz (112.22 kg)  BMI 41.17 kg/m2  SpO2 96% General appearance: alert, cooperative, appears stated age and no distress Throat: lips, mucosa, and tongue normal; teeth and gums normal Neck: no adenopathy, supple, symmetrical, trachea midline and thyroid not enlarged, symmetric, no tenderness/mass/nodules Lungs: clear to auscultation bilaterally Breasts: normal appearance, no masses or tenderness Heart: S1, S2 normal Extremities: extremities normal, atraumatic, no cyanosis or edema2      Assessment & Plan:  ghm utd Check labs See AVS See orders

## 2013-02-25 ENCOUNTER — Telehealth: Payer: Self-pay | Admitting: Family Medicine

## 2013-02-25 NOTE — Telephone Encounter (Signed)
Pt was called by our office pt states that the pain is still constant. While pt is sitting pain radiates up her leg into her groin. Pt Was advised to begin lodine again and pt has cancelled her surgery.

## 2013-02-25 NOTE — Telephone Encounter (Signed)
Caller: [name]/Patient; Phone: [phone]; Reason for Call: Pt calling to ask about the pain medication for her knee as discussed with Dr Laury Axon in the office on Tuesday.  Also pt would like to update provider, she did cancel her eyelid surgey for the present time.  PLEASE F/U WITH PT TO ADVISE ABOUT MEDICATION FOR THE PAIN.  THANK YOU.

## 2013-03-10 ENCOUNTER — Telehealth: Payer: Self-pay | Admitting: *Deleted

## 2013-03-10 ENCOUNTER — Ambulatory Visit (INDEPENDENT_AMBULATORY_CARE_PROVIDER_SITE_OTHER): Payer: Medicare Other | Admitting: Family Medicine

## 2013-03-10 ENCOUNTER — Encounter: Payer: Self-pay | Admitting: Family Medicine

## 2013-03-10 VITALS — BP 180/80 | HR 61 | Temp 98.2°F | Ht 65.0 in | Wt 247.2 lb

## 2013-03-10 DIAGNOSIS — I10 Essential (primary) hypertension: Secondary | ICD-10-CM | POA: Diagnosis not present

## 2013-03-10 MED ORDER — OLMESARTAN-AMLODIPINE-HCTZ 40-5-12.5 MG PO TABS: 1.0000 | ORAL_TABLET | Freq: Every day | ORAL | Status: DC

## 2013-03-10 MED ORDER — CARVEDILOL 25 MG PO TABS: 25.0000 mg | ORAL_TABLET | Freq: Two times a day (BID) | ORAL | Status: DC

## 2013-03-10 NOTE — Assessment & Plan Note (Signed)
Deteriorated.  BP not at all controlled despite Norvasc, Benicar, and Coreg.  Unable to titrate Coreg due to pulse.  Unable to increase norvasc due to hx of edema.  Will add HCTZ daily and monitor BP closely.  Reviewed supportive care and red flags that should prompt return.  Pt expressed understanding and is in agreement w/ plan.

## 2013-03-10 NOTE — Progress Notes (Signed)
  Subjective:    Patient ID: Susan Pittman, female    DOB: 1941-05-27, 72 y.o.   MRN: [id number]  HPI HTN- chronic problem, poor control today.  Saw Dr Laury Axon and Coreg was increased.  Pt hesitant to increase amlodipine due to swelling.  Azor 5/40, Coreg 25 BID.  Not taking lasix unless swelling- last dose ~1 month ago.  No CP, SOB, HAs, edema.  Exercising regularly (water aerobics)   Review of Systems For ROS see HPI     Objective:   Physical Exam  Vitals reviewed. Constitutional: She is oriented to person, place, and time. She appears well-developed and well-nourished. No distress.  HENT:  Head: Normocephalic and atraumatic.  Eyes: Conjunctivae and EOM are normal. Pupils are equal, round, and reactive to light.  Neck: Normal range of motion. Neck supple. No thyromegaly present.  Cardiovascular: Normal rate, regular rhythm, normal heart sounds and intact distal pulses.   No murmur heard. Pulmonary/Chest: Effort normal and breath sounds normal. No respiratory distress.  Abdominal: Soft. She exhibits no distension. There is no tenderness.  Musculoskeletal: She exhibits no edema.  Lymphadenopathy:    She has no cervical adenopathy.  Neurological: She is alert and oriented to person, place, and time.  Skin: Skin is warm and dry.  Psychiatric: She has a normal mood and affect. Her behavior is normal.          Assessment & Plan:

## 2013-03-10 NOTE — Patient Instructions (Addendum)
Follow up in 3-4 weeks to recheck pressure STOP the Benzor START the Tribenzor Call with any questions or concerns Hang in there!!!

## 2013-03-10 NOTE — Telephone Encounter (Signed)
PA completed over the phone awaiting fax with response.

## 2013-03-10 NOTE — Addendum Note (Signed)
Addended by: Verdie Shire on: 03/10/2013 04:18 PM   Modules accepted: Orders

## 2013-03-12 NOTE — Telephone Encounter (Signed)
Prior Auth approved 11-03-13 under medicare Part D benefit, approval letter scan to chart

## 2013-04-01 ENCOUNTER — Ambulatory Visit (INDEPENDENT_AMBULATORY_CARE_PROVIDER_SITE_OTHER): Payer: Medicare Other | Admitting: Family Medicine

## 2013-04-01 ENCOUNTER — Encounter: Payer: Self-pay | Admitting: Family Medicine

## 2013-04-01 VITALS — BP 137/78 | HR 47 | Temp 98.3°F | Ht 65.0 in | Wt 246.4 lb

## 2013-04-01 DIAGNOSIS — I10 Essential (primary) hypertension: Secondary | ICD-10-CM | POA: Diagnosis not present

## 2013-04-01 DIAGNOSIS — M1990 Unspecified osteoarthritis, unspecified site: Secondary | ICD-10-CM

## 2013-04-01 DIAGNOSIS — E785 Hyperlipidemia, unspecified: Secondary | ICD-10-CM

## 2013-04-01 LAB — BASIC METABOLIC PANEL
BUN: 27 mg/dL — ABNORMAL HIGH (ref 6–23)
CO2: 28 mEq/L (ref 19–32)
Chloride: 104 mEq/L (ref 96–112)
Creatinine, Ser: 1.2 mg/dL (ref 0.4–1.2)
Potassium: 4.3 mEq/L (ref 3.5–5.1)

## 2013-04-01 LAB — LIPID PANEL
Cholesterol: 173 mg/dL (ref 0–200)
LDL Cholesterol: 99 mg/dL (ref 0–99)
Total CHOL/HDL Ratio: 3
Triglycerides: 120 mg/dL (ref 0.0–149.0)

## 2013-04-01 LAB — HEPATIC FUNCTION PANEL
ALT: 15 U/L (ref 0–35)
AST: 16 U/L (ref 0–37)
Total Bilirubin: 0.7 mg/dL (ref 0.3–1.2)
Total Protein: 7.3 g/dL (ref 6.0–8.3)

## 2013-04-01 NOTE — Progress Notes (Signed)
  Subjective:    Patient ID: Susan Pittman, female    DOB: 04-10-1941, 72 y.o.   MRN: [id number]  HPI HTN- chronic problem, elevated today.  Pt reports home readings have also been high.  Was started on Tribenzor at last visit.  Still on Coreg also.  Not currently on Lasix (been off med x2 months).  Denies CP, SOB, HAs, visual changes, edema.  Knee pain- L knee.  Pt w/ hx of arthritic change.  Pt has seen Dr Eulah Pont before but wants a 2nd opinion.  Was told 8-10 yrs ago that she would need TKR but has been avoiding this.  On NSAIDs regularly.  Hyperlipidemia- chronic problem, attempting to control w/ diet.  Doing water aerobics regularly.   Review of Systems For ROS see HPI     Objective:   Physical Exam  Vitals reviewed. Constitutional: She is oriented to person, place, and time. She appears well-developed and well-nourished. No distress.  HENT:  Head: Normocephalic and atraumatic.  Eyes: Conjunctivae and EOM are normal. Pupils are equal, round, and reactive to light.  Neck: Normal range of motion. Neck supple. No thyromegaly present.  Cardiovascular: Normal rate, regular rhythm, normal heart sounds and intact distal pulses.   No murmur heard. Pulmonary/Chest: Effort normal and breath sounds normal. No respiratory distress.  Abdominal: Soft. She exhibits no distension. There is no tenderness.  Musculoskeletal: She exhibits no edema.  Lymphadenopathy:    She has no cervical adenopathy.  Neurological: She is alert and oriented to person, place, and time.  Skin: Skin is warm and dry.  Psychiatric: She has a normal mood and affect. Her behavior is normal.          Assessment & Plan:

## 2013-04-01 NOTE — Patient Instructions (Addendum)
Schedule your complete physical in 6 months No BP med changes- it looked good on recheck We'll notify you of your lab results We'll call you with your ortho appt Call with any questions or concerns Hang in there!!!

## 2013-04-03 NOTE — Assessment & Plan Note (Signed)
Pt w/ severe degenerative changes of knees bilaterally but L>R.  Was told years ago to consider TKR and is now wanting to discuss her options.  Was not happy w/ previous ortho, will refer for complete evaluation/2nd opinion.  Continue NSAIDs.  Pt expressed understanding and is in agreement w/ plan.

## 2013-04-03 NOTE — Assessment & Plan Note (Signed)
Chronic problem.  Has been attempting to control w/ healthy diet and regular exercise.  Not currently on meds.  Check labs.  Start meds prn.

## 2013-04-03 NOTE — Assessment & Plan Note (Signed)
Chronic problem, was switched to Tribenzor at last visit.  Tolerating med w/out difficulty.  BP was initially elevated at start of visit but this is likely due to knee pain w/ ambulation as it improved w/ rest.  Check labs.  No med changes at this time.

## 2013-06-22 ENCOUNTER — Other Ambulatory Visit: Payer: Self-pay | Admitting: Family Medicine

## 2013-06-23 NOTE — Telephone Encounter (Signed)
Med filled.  

## 2013-07-22 ENCOUNTER — Other Ambulatory Visit: Payer: Self-pay | Admitting: Family Medicine

## 2013-07-22 NOTE — Telephone Encounter (Signed)
Rx filled and sent to pharmacy. SW, CMA 

## 2013-07-29 DIAGNOSIS — M1710 Unilateral primary osteoarthritis, unspecified knee: Secondary | ICD-10-CM | POA: Diagnosis not present

## 2013-08-12 DIAGNOSIS — Z23 Encounter for immunization: Secondary | ICD-10-CM | POA: Diagnosis not present

## 2013-08-13 ENCOUNTER — Encounter: Payer: Self-pay | Admitting: Family Medicine

## 2013-08-13 ENCOUNTER — Ambulatory Visit (INDEPENDENT_AMBULATORY_CARE_PROVIDER_SITE_OTHER): Payer: Medicare Other | Admitting: Family Medicine

## 2013-08-13 ENCOUNTER — Ambulatory Visit: Payer: Medicare Other | Admitting: *Deleted

## 2013-08-13 VITALS — BP 172/96 | HR 60 | Temp 98.4°F | Resp 16 | Wt 248.4 lb

## 2013-08-13 VITALS — BP 170/81

## 2013-08-13 DIAGNOSIS — R030 Elevated blood-pressure reading, without diagnosis of hypertension: Secondary | ICD-10-CM | POA: Diagnosis not present

## 2013-08-13 DIAGNOSIS — I10 Essential (primary) hypertension: Secondary | ICD-10-CM | POA: Diagnosis not present

## 2013-08-13 MED ORDER — NEBIVOLOL HCL 20 MG PO TABS: 1.0000 | ORAL_TABLET | Freq: Every day | ORAL | Status: DC

## 2013-08-13 NOTE — Patient Instructions (Signed)
Follow up next week to recheck BP STOP the Coreg START the Bystolic daily Continue Tribenzor daily Continue to monitor BP Call with any questions or concerns Hang in there!!!

## 2013-08-13 NOTE — Progress Notes (Signed)
  Subjective:    Patient ID: Susan Pittman, female    DOB: 20-May-1941, 72 y.o.   MRN: [id number]  HPI HTN- pt's BP has been running high at home all week.  Came in for nurse visit for BP check and BP is 172/96.  On Carvedilol 25 mg BID, Tribenzor 40/5/12.5.  No CP, SOB.  + HA, some ringing in the ear.  Denies increased stress.  Had swelling when Amlodipine was increased to 10mg .   Review of Systems For ROS see HPI     Objective:   Physical Exam  Vitals reviewed. Constitutional: She is oriented to person, place, and time. She appears well-developed and well-nourished. No distress.  HENT:  Head: Normocephalic and atraumatic.  Eyes: Conjunctivae and EOM are normal. Pupils are equal, round, and reactive to light.  Neck: Normal range of motion. Neck supple. No thyromegaly present.  Cardiovascular: Normal rate, regular rhythm, normal heart sounds and intact distal pulses.   No murmur heard. Pulmonary/Chest: Effort normal and breath sounds normal. No respiratory distress.  Abdominal: Soft. She exhibits no distension. There is no tenderness.  Musculoskeletal: She exhibits no edema.  Lymphadenopathy:    She has no cervical adenopathy.  Neurological: She is alert and oriented to person, place, and time.  Skin: Skin is warm and dry.  Psychiatric: She has a normal mood and affect. Her behavior is normal.          Assessment & Plan:

## 2013-08-13 NOTE — Progress Notes (Signed)
Patient ID: Susan Pittman, female   DOB: May 06, 1941, 72 y.o.   MRN: [id number]  Blood pressure check Prior blood pressure readings 08/07/2013 @ 12:30 p.m. 146/80 08/10/2013 @ 7 a.m. 151/100 08/10/2013 @ 8:45 a.m. 167/85 08/10/2013 @ 1 p.m. 159/97 08/10/2013 @ 1:20 p.m. 133/74 08/11/2013 @ 7:30 a.m. 162/96 08/13/2013 @ 7:30 a.m. 156/84 08/13/2013 @ 11:30 a.m. 166/108 08/13/2013 @ 11:45 a.m. 170/96 08/13/2013 @ 2:10 p.m. 175/97

## 2013-08-15 NOTE — Assessment & Plan Note (Signed)
Deteriorated.  Pt is maxed out on Benicar, did not tolerate increasing Amlodipine to 10mg .  Will stop carvedilol and switch to Bystolic 20mg  daily and see if BP improves.  Reviewed supportive care and red flags that should prompt return.  Pt expressed understanding and is in agreement w/ plan.

## 2013-08-19 ENCOUNTER — Ambulatory Visit (INDEPENDENT_AMBULATORY_CARE_PROVIDER_SITE_OTHER): Payer: Medicare Other | Admitting: Family Medicine

## 2013-08-19 ENCOUNTER — Encounter: Payer: Self-pay | Admitting: Family Medicine

## 2013-08-19 VITALS — BP 170/80 | HR 52 | Temp 98.1°F | Resp 16 | Wt 245.5 lb

## 2013-08-19 DIAGNOSIS — I10 Essential (primary) hypertension: Secondary | ICD-10-CM | POA: Diagnosis not present

## 2013-08-19 MED ORDER — VALSARTAN-HYDROCHLOROTHIAZIDE 320-12.5 MG PO TABS: 1.0000 | ORAL_TABLET | Freq: Every day | ORAL | Status: DC

## 2013-08-19 MED ORDER — AMLODIPINE BESYLATE 5 MG PO TABS: 5.0000 mg | ORAL_TABLET | Freq: Every day | ORAL | Status: DC

## 2013-08-19 NOTE — Progress Notes (Signed)
  Subjective:    Patient ID: Susan Pittman, female    DOB: 09-02-1941, 72 y.o.   MRN: [id number]  HPI HTN- BP remains elevated despite switch to Bystolic from Coreg.  On Benicar, Amlodipine, HCTZ.  Has been on Lodine x20 yrs.  Home BP readings range from 140/80- 200/111.  No CP, no SOB, + HAs.  No edema.   Review of Systems For ROS see HPI     Objective:   Physical Exam  Vitals reviewed. Constitutional: She is oriented to person, place, and time. She appears well-developed and well-nourished. No distress.  HENT:  Head: Normocephalic and atraumatic.  Eyes: Conjunctivae and EOM are normal. Pupils are equal, round, and reactive to light.  Neck: Normal range of motion. Neck supple. No thyromegaly present.  Cardiovascular: Normal rate, regular rhythm, normal heart sounds and intact distal pulses.   No murmur heard. Pulmonary/Chest: Effort normal and breath sounds normal. No respiratory distress.  Abdominal: Soft. She exhibits no distension. There is no tenderness.  Musculoskeletal: She exhibits no edema.  Lymphadenopathy:    She has no cervical adenopathy.  Neurological: She is alert and oriented to person, place, and time.  Skin: Skin is warm and dry.  Psychiatric: She has a normal mood and affect. Her behavior is normal.          Assessment & Plan:

## 2013-08-19 NOTE — Patient Instructions (Signed)
Follow up in 1 week to recheck BP STOP the TriBenzor START the Valsartan HCTZ combo and add the separate Amlodipine Continue the Bystolic Limit your salt intake Try and get regular activity Call with any questions or concerns Hang in there!

## 2013-08-19 NOTE — Assessment & Plan Note (Signed)
Unchanged.  pts BP is not well controlled.  Asymptomatic w/ exception of HA.  Will switch Benicar to Diovan.  Continue Bystolic, HCTZ, Amlodipine.  Will consider clonidine if no improvement.  Will follow closely.

## 2013-08-23 ENCOUNTER — Telehealth: Payer: Self-pay | Admitting: General Practice

## 2013-08-23 NOTE — Telephone Encounter (Signed)
PA began for valsartan-HCTZ 320-12.5mg . Paperwork faxed to [facility] at [phone].

## 2013-08-24 MED ORDER — VALSARTAN-HYDROCHLOROTHIAZIDE 320-12.5 MG PO TABS: 1.0000 | ORAL_TABLET | Freq: Every day | ORAL | Status: DC

## 2013-08-24 NOTE — Telephone Encounter (Signed)
PA approved until 11-03-2014. Paperwork sent to scanning and meds resent to pharmacy. Ref # [id number]

## 2013-08-31 ENCOUNTER — Encounter: Payer: Self-pay | Admitting: Family Medicine

## 2013-08-31 ENCOUNTER — Ambulatory Visit (INDEPENDENT_AMBULATORY_CARE_PROVIDER_SITE_OTHER): Payer: Medicare Other | Admitting: Family Medicine

## 2013-08-31 VITALS — BP 140/70 | HR 49 | Temp 98.2°F | Resp 16 | Wt 246.0 lb

## 2013-08-31 DIAGNOSIS — I10 Essential (primary) hypertension: Secondary | ICD-10-CM | POA: Diagnosis not present

## 2013-08-31 LAB — BASIC METABOLIC PANEL
BUN: 28 mg/dL — ABNORMAL HIGH (ref 6–23)
Chloride: 100 mEq/L (ref 96–112)
GFR: 46.13 mL/min — ABNORMAL LOW (ref >60.00)
Potassium: 4.3 mEq/L (ref 3.5–5.1)
Sodium: 137 mEq/L (ref 135–145)

## 2013-08-31 NOTE — Progress Notes (Signed)
  Subjective:    Patient ID: Susan Pittman, female    DOB: 10-02-1941, 72 y.o.   MRN: [id number]  HPI HTN- chronic problem, pt was switched to Diovan HCT at last visit.  Continuing the Amlodipine and Bystolic.  Pt reports that she was eating some foods high in Na but is now much more aware and limiting those in her diet.  Will have a HA after taking BP meds that will last ~45 minutes and then resolve w/out intervention.  No CP, SOB, visual changes, edema.   Review of Systems For ROS see HPI     Objective:   Physical Exam  Vitals reviewed. Constitutional: She is oriented to person, place, and time. She appears well-developed and well-nourished. No distress.  HENT:  Head: Normocephalic and atraumatic.  Eyes: Conjunctivae and EOM are normal. Pupils are equal, round, and reactive to light.  Neck: Normal range of motion. Neck supple. No thyromegaly present.  Cardiovascular: Normal rate, regular rhythm, normal heart sounds and intact distal pulses.   No murmur heard. Pulmonary/Chest: Effort normal and breath sounds normal. No respiratory distress.  Abdominal: Soft. She exhibits no distension. There is no tenderness.  Musculoskeletal: She exhibits no edema.  Lymphadenopathy:    She has no cervical adenopathy.  Neurological: She is alert and oriented to person, place, and time.  Skin: Skin is warm and dry.  Psychiatric: She has a normal mood and affect. Her behavior is normal.          Assessment & Plan:

## 2013-08-31 NOTE — Patient Instructions (Signed)
Follow up as scheduled Continue the Bystolic 20mg  daily- 1 of your current pills, 2 of the new samples We'll notify you of your lab results and make any changes if needed Call with any questions or concerns Happy Halloween!!!

## 2013-08-31 NOTE — Assessment & Plan Note (Signed)
Improved since switching meds at last visit.  Pt does have transient HA after taking AM meds but after a discussion about this, is willing to give this more time and see if sxs resolve spontaneously since BP is so much better.  'overall, i feel great'.  Check BMP to ensure normal renal fxn w/ change in ACE.  Pt expressed understanding and is in agreement w/ plan.

## 2013-09-01 DIAGNOSIS — L821 Other seborrheic keratosis: Secondary | ICD-10-CM | POA: Diagnosis not present

## 2013-09-01 DIAGNOSIS — D2360 Other benign neoplasm of skin of unspecified upper limb, including shoulder: Secondary | ICD-10-CM | POA: Diagnosis not present

## 2013-09-01 DIAGNOSIS — D485 Neoplasm of uncertain behavior of skin: Secondary | ICD-10-CM | POA: Diagnosis not present

## 2013-09-01 DIAGNOSIS — C44611 Basal cell carcinoma of skin of unspecified upper limb, including shoulder: Secondary | ICD-10-CM | POA: Diagnosis not present

## 2013-09-01 DIAGNOSIS — Z85828 Personal history of other malignant neoplasm of skin: Secondary | ICD-10-CM | POA: Diagnosis not present

## 2013-09-01 DIAGNOSIS — D239 Other benign neoplasm of skin, unspecified: Secondary | ICD-10-CM | POA: Diagnosis not present

## 2013-09-09 ENCOUNTER — Other Ambulatory Visit: Payer: Self-pay

## 2013-09-10 ENCOUNTER — Telehealth: Payer: Self-pay

## 2013-09-10 NOTE — Telephone Encounter (Signed)
Medication and allergies: reviewed and updated  90 day supply/mail order: na Local pharmacy: [facility]   Immunizations due:  shingles  A/P:   No changes to [facility] or [facility] Unsure of last CCS but was negative MMG--10/2012--neg Dexa--10/2030  To Discuss with Provider: Not at this time

## 2013-09-13 ENCOUNTER — Encounter: Payer: Self-pay | Admitting: Family Medicine

## 2013-09-13 ENCOUNTER — Ambulatory Visit (INDEPENDENT_AMBULATORY_CARE_PROVIDER_SITE_OTHER): Payer: Medicare Other | Admitting: Family Medicine

## 2013-09-13 VITALS — BP 150/82 | HR 55 | Temp 98.3°F | Resp 16 | Ht 64.75 in | Wt 247.0 lb

## 2013-09-13 DIAGNOSIS — Z0000 Encounter for general adult medical examination without abnormal findings: Secondary | ICD-10-CM | POA: Diagnosis not present

## 2013-09-13 DIAGNOSIS — Z1211 Encounter for screening for malignant neoplasm of colon: Secondary | ICD-10-CM

## 2013-09-13 DIAGNOSIS — I10 Essential (primary) hypertension: Secondary | ICD-10-CM

## 2013-09-13 DIAGNOSIS — E785 Hyperlipidemia, unspecified: Secondary | ICD-10-CM

## 2013-09-13 DIAGNOSIS — E039 Hypothyroidism, unspecified: Secondary | ICD-10-CM

## 2013-09-13 LAB — CBC WITH DIFFERENTIAL/PLATELET
Basophils Relative: 0.6 % (ref 0.0–3.0)
Eosinophils Relative: 2.7 % (ref 0.0–5.0)
HCT: 39.3 % (ref 36.0–46.0)
Hemoglobin: 13.2 g/dL (ref 12.0–15.0)
Lymphocytes Relative: 21.4 % (ref 12.0–46.0)
Lymphs Abs: 1.6 10*3/uL (ref 0.7–4.0)
MCV: 86.3 fl (ref 78.0–100.0)
Monocytes Absolute: 0.4 10*3/uL (ref 0.1–1.0)
Monocytes Relative: 6 % (ref 3.0–12.0)
Neutro Abs: 5.2 10*3/uL (ref 1.4–7.7)
RBC: 4.55 Mil/uL (ref 3.87–5.11)
WBC: 7.5 10*3/uL (ref 4.5–10.5)

## 2013-09-13 LAB — BASIC METABOLIC PANEL
BUN: 25 mg/dL — ABNORMAL HIGH (ref 6–23)
Calcium: 9 mg/dL (ref 8.4–10.5)
Chloride: 103 mEq/L (ref 96–112)
GFR: 52.59 mL/min — ABNORMAL LOW (ref >60.00)
Glucose, Bld: 95 mg/dL (ref 70–99)

## 2013-09-13 LAB — HEPATIC FUNCTION PANEL
Albumin: 3.7 g/dL (ref 3.5–5.2)
Total Protein: 7.1 g/dL (ref 6.0–8.3)

## 2013-09-13 LAB — LIPID PANEL
HDL: 46.3 mg/dL (ref >39.00)
Triglycerides: 116 mg/dL (ref 0.0–149.0)

## 2013-09-13 LAB — TSH: TSH: 5.19 u[IU]/mL (ref 0.35–5.50)

## 2013-09-13 NOTE — Assessment & Plan Note (Signed)
Chronic problem.  Asymptomatic.  Check labs.  Adjust meds prn  

## 2013-09-13 NOTE — Assessment & Plan Note (Signed)
Pt's PE WNL.  UTD on mammo, DEXA.  Will contact GI to determine when she's due for colonoscopy.  Check labs.  Anticipatory guidance provided.

## 2013-09-13 NOTE — Progress Notes (Signed)
  Subjective:    Patient ID: Susan Pittman, female    DOB: Dec 26, 1940, 72 y.o.   MRN: [id number]  HPI Pre visit review using our clinic review tool, if applicable. No additional management support is needed unless otherwise documented below in the visit note.   Here today for CPE.  Risk Factors: HTN- chronic problem, BP is fluctuating from 130-180/70-90.  On Amlodipine, Valsartan/HCTZ, bystolic.  No CP, SOB above baseline, HAs, visual changes, edema. Hyperlipidemia- chronic problem, attempting to control w/ healthy diet and exercise Hypothyroid- chronic problem, on Synthroid.  Denies fatigue, constipation, changes to skin/hair/nails Physical Activity: walking regularly, water exercise Fall Risk: low risk Depression: denies current sxs Hearing: decreased to conversational tones and whispered voice.  No longer able to hear the timer on oven when in other room. ADL's: independent Cognitive: normal linear thought process, memory and attention intact Home Safety: safe at home, lives w/ husband Height, Weight, BMI, Visual Acuity: see vitals, vision corrected to 20/20 w/ glasses Counseling: UTD on DEXA, mammo.  Overdue on colonoscopy (Dr Loreta Ave) Labs Ordered: See A&P Care Plan: See A&P    Review of Systems Patient reports no vision changes, adenopathy,fever, weight change,  persistant/recurrent hoarseness , swallowing issues, chest pain, palpitations, edema, persistant/recurrent cough, hemoptysis, dyspnea (rest/exertional/paroxysmal nocturnal), gastrointestinal bleeding (melena, rectal bleeding), abdominal pain, significant heartburn, bowel changes, GU symptoms (dysuria, hematuria, incontinence), Gyn symptoms (abnormal  bleeding, pain),  syncope, focal weakness, memory loss, numbness & tingling, skin/hair/nail changes, abnormal bruising or bleeding, anxiety, or depression.     Objective:   Physical Exam General Appearance:    Alert, cooperative, no distress, appears stated age  Head:     Normocephalic, without obvious abnormality, atraumatic  Eyes:    PERRL, conjunctiva/corneas clear, EOM's intact, fundi    benign, both eyes  Ears:    Normal TM's and external ear canals, both ears  Nose:   Nares normal, septum midline, mucosa normal, no drainage    or sinus tenderness  Throat:   Lips, mucosa, and tongue normal; teeth and gums normal  Neck:   Supple, symmetrical, trachea midline, no adenopathy;    Thyroid: no enlargement/tenderness/nodules  Back:     Symmetric, no curvature, ROM normal, no CVA tenderness  Lungs:     Clear to auscultation bilaterally, respirations unlabored  Chest Wall:    No tenderness or deformity   Heart:    Regular rate and rhythm, S1 and S2 normal, no murmur, rub   or gallop  Breast Exam:    Deferred to mammo  Abdomen:     Soft, non-tender, bowel sounds active all four quadrants,    no masses, no organomegaly  Genitalia:    Deferred  Rectal:    Extremities:   Extremities normal, atraumatic, no cyanosis or edema  Pulses:   2+ and symmetric all extremities  Skin:   Skin color, texture, turgor normal, no rashes or lesions  Lymph nodes:   Cervical, supraclavicular, and axillary nodes normal  Neurologic:   CNII-XII intact, normal strength, sensation and reflexes    throughout          Assessment & Plan:

## 2013-09-13 NOTE — Assessment & Plan Note (Signed)
Chronic problem, recently difficult to control.  Asymptomatic.  Due to pt's low pulse, hesitant to add any meds or make continued adjustments.  Will refer to cards for their assistance.  Reviewed supportive care and red flags that should prompt return.  Pt expressed understanding and is in agreement w/ plan.

## 2013-09-13 NOTE — Assessment & Plan Note (Signed)
Chronic problem, attempting to control w/ diet and exercise.  Check labs.  Start meds prn.

## 2013-09-13 NOTE — Patient Instructions (Signed)
Follow up in 6 months to recheck BP and cholesterol We'll call you with your cardiology and GI appt information Schedule your mammo Start the peroxide in the ears daily to soften and liquify the wax We'll notify you of your lab results and make any changes if needed Call with any questions or concerns Happy Holidays!

## 2013-09-14 ENCOUNTER — Other Ambulatory Visit: Payer: Self-pay | Admitting: General Practice

## 2013-09-14 MED ORDER — LEVOTHYROXINE SODIUM 88 MCG PO TABS: 88.0000 ug | ORAL_TABLET | Freq: Every day | ORAL | Status: DC

## 2013-09-20 ENCOUNTER — Ambulatory Visit (INDEPENDENT_AMBULATORY_CARE_PROVIDER_SITE_OTHER): Payer: Medicare Other | Admitting: Family Medicine

## 2013-09-20 ENCOUNTER — Encounter: Payer: Self-pay | Admitting: Family Medicine

## 2013-09-20 VITALS — BP 132/78 | HR 51 | Temp 98.3°F | Resp 16 | Wt 245.2 lb

## 2013-09-20 DIAGNOSIS — H6120 Impacted cerumen, unspecified ear: Secondary | ICD-10-CM | POA: Diagnosis not present

## 2013-09-20 DIAGNOSIS — H918X9 Other specified hearing loss, unspecified ear: Secondary | ICD-10-CM

## 2013-09-20 NOTE — Patient Instructions (Signed)
Follow up as needed Nothing to do! Call with any questions or concerns Hang in there! Happy Thanksgiving!

## 2013-09-20 NOTE — Progress Notes (Signed)
  Subjective:    Patient ID: Susan Pittman, female    DOB: 1941/04/05, 72 y.o.   MRN: [id number]  HPI Cerumen impaction- pt has been using H2O2 and murine oil in her ears to try and soften wax.  Now hearing is so muffled that it is very bothersome.  No pain.  No drainage.   Review of Systems For ROS see HPI     Objective:   Physical Exam  Vitals reviewed. Constitutional: She appears well-developed and well-nourished. No distress.  HENT:  Bilateral cerumen impaction Minimal yield w/ curette on R ear, success w/ irrigation L ear was able to be cleared completely using curette- copious wax removed          Assessment & Plan:

## 2013-09-20 NOTE — Assessment & Plan Note (Signed)
New.  sxs immediately improved w/ wax removal.  TMs WNL bilaterally.  No need for additional tx.

## 2013-09-23 ENCOUNTER — Other Ambulatory Visit: Payer: Self-pay | Admitting: Family Medicine

## 2013-09-23 DIAGNOSIS — C44519 Basal cell carcinoma of skin of other part of trunk: Secondary | ICD-10-CM | POA: Diagnosis not present

## 2013-09-23 NOTE — Telephone Encounter (Signed)
Med filled.  

## 2013-10-05 DIAGNOSIS — H2510 Age-related nuclear cataract, unspecified eye: Secondary | ICD-10-CM | POA: Diagnosis not present

## 2013-10-06 ENCOUNTER — Encounter: Payer: Self-pay | Admitting: Family Medicine

## 2013-10-12 DIAGNOSIS — Z803 Family history of malignant neoplasm of breast: Secondary | ICD-10-CM | POA: Diagnosis not present

## 2013-10-12 DIAGNOSIS — Z1231 Encounter for screening mammogram for malignant neoplasm of breast: Secondary | ICD-10-CM | POA: Diagnosis not present

## 2013-10-13 ENCOUNTER — Ambulatory Visit (INDEPENDENT_AMBULATORY_CARE_PROVIDER_SITE_OTHER): Payer: Medicare Other | Admitting: Cardiovascular Disease

## 2013-10-13 ENCOUNTER — Encounter: Payer: Self-pay | Admitting: Cardiovascular Disease

## 2013-10-13 VITALS — BP 180/100 | HR 58 | Ht 65.0 in | Wt 246.9 lb

## 2013-10-13 DIAGNOSIS — I10 Essential (primary) hypertension: Secondary | ICD-10-CM | POA: Diagnosis not present

## 2013-10-13 DIAGNOSIS — R0989 Other specified symptoms and signs involving the circulatory and respiratory systems: Secondary | ICD-10-CM

## 2013-10-13 DIAGNOSIS — Z13220 Encounter for screening for lipoid disorders: Secondary | ICD-10-CM | POA: Diagnosis not present

## 2013-10-13 DIAGNOSIS — Z79899 Other long term (current) drug therapy: Secondary | ICD-10-CM | POA: Diagnosis not present

## 2013-10-13 DIAGNOSIS — R0609 Other forms of dyspnea: Secondary | ICD-10-CM

## 2013-10-13 DIAGNOSIS — R0683 Snoring: Secondary | ICD-10-CM

## 2013-10-13 DIAGNOSIS — E039 Hypothyroidism, unspecified: Secondary | ICD-10-CM | POA: Diagnosis not present

## 2013-10-13 DIAGNOSIS — E559 Vitamin D deficiency, unspecified: Secondary | ICD-10-CM | POA: Diagnosis not present

## 2013-10-13 DIAGNOSIS — J45909 Unspecified asthma, uncomplicated: Secondary | ICD-10-CM

## 2013-10-13 DIAGNOSIS — I491 Atrial premature depolarization: Secondary | ICD-10-CM

## 2013-10-13 DIAGNOSIS — R609 Edema, unspecified: Secondary | ICD-10-CM

## 2013-10-13 MED ORDER — VALSARTAN-HYDROCHLOROTHIAZIDE 320-25 MG PO TABS: 1.0000 | ORAL_TABLET | Freq: Every day | ORAL | Status: DC

## 2013-10-13 NOTE — Patient Instructions (Addendum)
Your physician has requested that you have an echocardiogram. Echocardiography is a painless test that uses sound waves to create images of your heart. It provides your doctor with information about the size and shape of your heart and how well your heart's chambers and valves are working. This procedure takes approximately one hour. There are no restrictions for this procedure.  Your physician has requested that you have a renal artery duplex. During this test, an ultrasound is used to evaluate blood flow to the kidneys. Allow one hour for this exam. Do not eat after midnight the day before and avoid carbonated beverages. Take your medications as you usually do.  Your physician recommends that you return for lab work fasting  Your physician recommends that you schedule a follow-up appointment in: last week of December.  Your physician has recommended you make the following change in your medication: increase the valsartan-hct to 320-25. This has already been sent to the pharmacy.

## 2013-10-15 ENCOUNTER — Ambulatory Visit (HOSPITAL_COMMUNITY)
Admission: RE | Admit: 2013-10-15 | Discharge: 2013-10-15 | Disposition: A | Discharge status: Home / Self Care | Payer: Medicare Other | Source: Ambulatory Visit | Attending: Cardiovascular Disease | Admitting: Cardiovascular Disease

## 2013-10-15 DIAGNOSIS — I701 Atherosclerosis of renal artery: Secondary | ICD-10-CM | POA: Diagnosis not present

## 2013-10-15 DIAGNOSIS — I10 Essential (primary) hypertension: Secondary | ICD-10-CM

## 2013-10-15 LAB — LIPID PANEL
Cholesterol: 180 mg/dL (ref 0–200)
HDL: 52 mg/dL (ref >39)
Triglycerides: 105 mg/dL (ref <150)
VLDL: 21 mg/dL (ref 0–40)

## 2013-10-15 LAB — COMPREHENSIVE METABOLIC PANEL
ALT: 14 U/L (ref 0–35)
BUN: 23 mg/dL (ref 6–23)
CO2: 29 mEq/L (ref 19–32)
Calcium: 9.4 mg/dL (ref 8.4–10.5)
Chloride: 105 mEq/L (ref 96–112)
Creat: 1.05 mg/dL (ref 0.50–1.10)
Glucose, Bld: 87 mg/dL (ref 70–99)

## 2013-10-15 LAB — CBC
HCT: 43.7 % (ref 36.0–46.0)
Hemoglobin: 14.3 g/dL (ref 12.0–15.0)
MCH: 29.1 pg (ref 26.0–34.0)
Platelets: 288 10*3/uL (ref 150–400)
RBC: 4.92 MIL/uL (ref 3.87–5.11)
RDW: 15 % (ref 11.5–15.5)
WBC: 6.8 10*3/uL (ref 4.0–10.5)

## 2013-10-15 NOTE — Progress Notes (Addendum)
Renal Duplex Completed. Preliminary report by tech - >60% stenosis in the RRA.  Marilynne Halsted, BS, RDMS, RVT

## 2013-10-19 ENCOUNTER — Encounter: Payer: Self-pay | Admitting: Family Medicine

## 2013-10-19 LAB — VITAMIN D 1,25 DIHYDROXY: Vitamin D2 1, 25 (OH)2: <8 pg/mL

## 2013-10-22 ENCOUNTER — Telehealth: Payer: Self-pay | Admitting: *Deleted

## 2013-10-22 NOTE — Telephone Encounter (Signed)
Pt was calling in regards to her doppler. She stated that no one has called her back about it yet. She had it done on the December 12th.  TK

## 2013-10-22 NOTE — Telephone Encounter (Signed)
Message forwarded to Dr. Kelly/Wanda, CMA.  

## 2013-10-22 NOTE — Telephone Encounter (Signed)
Amber has sent this to Dr. Tresa Endo for review. I will call the patient with the results once Dr. Tresa Endo has reviewed and given instructions.

## 2013-10-23 NOTE — Telephone Encounter (Signed)
Reviewed in result messages

## 2013-10-25 NOTE — Telephone Encounter (Signed)
Spoke with patient. Gave renal doppler and lab results.

## 2013-10-25 NOTE — Telephone Encounter (Signed)
Message copied by Gaynelle Cage on Mon Oct 25, 2013 12:53 PM ------      Message from: Lennette Bihari      Created: Sat Oct 23, 2013  4:32 PM       Renal doppler with Right RAS with high systolic velocity; Arrange for PV eval with JB; labs with nl renal fxn and very mild inc LDL ------

## 2013-10-31 ENCOUNTER — Encounter: Payer: Self-pay | Admitting: Cardiovascular Disease

## 2013-10-31 DIAGNOSIS — I491 Atrial premature depolarization: Secondary | ICD-10-CM | POA: Insufficient documentation

## 2013-10-31 NOTE — Progress Notes (Signed)
Patient ID: Susan Pittman, female   DOB: 17-Oct-1941, 72 y.o.   MRN: [id number]     PATIENT PROFILE: [name] is a 72 year old female who presents for cardiology evaluation courtesy of Dr. Beverely Low for evaluation and treatment of accelerated hypertension.   HPI: [name] has a long-standing history of hypertension for at least 30-40 years. She initially was treated with diuretic therapy. She also states that approximately 20 years ago she had undergone a cardiac catheterization and was not told of having significant coronary artery blockage. Additional problems have included significant arthritis and she has undergone knee arthroscopic surgery. She has a history of hypothyroidism, remote peptic ulcer disease in the 1980s, as well as GERD. She also does note palpitations typically in the evening. She was recently seen by Dr. Joesph Fillers and at that time her blood pressure had been fluctuating ranging from 1:30 to 180 systolically and 70 to 90s diastolically despite being on her medical regimen which consisted of amlodipine 5 mg, Bystolic 20 mg, and Diovan HCT 320/12.5 mg. She now presents for cardiology evaluation.  Additional problems also include hypothyroidism, osteoporosis, mild hyperlipidemia for which she's been followed medically.  Upon further questioning, the patient admits to difficulty with sleep. She does snore. She goes to bed sometime between 8:30 and 11 PM and wakes up between 5:30 and 6:30 AM. She often wakes up approximately 2 times per night. Her sleep is not highly restorative.   Past Medical History  Diagnosis Date  . Asthma   . Hypertension   . GERD (gastroesophageal reflux disease)   . Allergy   . Arthritis   . Hypothyroid   . Osteoporosis   . DJD (degenerative joint disease)   . Gastric ulcer   . Hiatal hernia   . Hyperlipidemia     Past Surgical History  Procedure Laterality Date  . Knee arthroscopy      R knee and L knee  . Neck surgery    .  Cholecystectomy    . Abdominal hysterectomy    . Spinal fusion      C5-C6    Allergies  Allergen Reactions  . Benazepril Hcl     REACTION: cough  . Celecoxib     REACTION: aching  . Lisinopril     REACTION: cough  . Loratadine   . Allegra [Fexofenadine Hcl]   . Sulfa Antibiotics     Current Outpatient Prescriptions  Medication Sig Dispense Refill  . amLODipine (NORVASC) 5 MG tablet Take 1 tablet (5 mg total) by mouth daily.  30 tablet  3  . aspirin 81 MG tablet Take 81 mg by mouth 3 (three) times a week.      . Calcium 600-200 MG-UNIT per tablet Take 1 tablet by mouth 2 (two) times daily.       . cetirizine (ZYRTEC) 10 MG tablet Take 10 mg by mouth daily.        Marland Kitchen etodolac (LODINE) 400 MG tablet Take ONE tablet by mouth two times daily  120 tablet  3  . fluticasone (FLONASE) 50 MCG/ACT nasal spray USE TWO SPRAY IN EACH NOSTRIL EVERY DAY  16 g  6  . lansoprazole (PREVACID) 15 MG capsule Take 15 mg by mouth daily.        Marland Kitchen levothyroxine (SYNTHROID, LEVOTHROID) 75 MCG tablet TAKE ONE TABLET BY MOUTH EVERY DAY  90 tablet  1  . Multiple Vitamins-Minerals (MULTI FOR HER 50+ PO) Take by mouth daily.        Marland Kitchen  Nebivolol HCl (BYSTOLIC) 20 MG TABS Take 1 tablet (20 mg total) by mouth daily.  30 tablet  3  . albuterol (PROAIR HFA) 108 (90 BASE) MCG/ACT inhaler Inhale 2 puffs into the lungs every 6 (six) hours as needed for wheezing or shortness of breath.  1 Inhaler  3  . valsartan-hydrochlorothiazide (DIOVAN-HCT) 320-25 MG per tablet Take 1 tablet by mouth daily.  90 tablet  3   No current facility-administered medications for this visit.    Social history is notable in that she's been married for 54 years. She has 3 children 3 grandchildren 2 great-grandchildren. 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 She completed 12th grade of education. There is no tobacco or alcohol use.  Family History  Problem Relation Age of Onset  . Cancer Sister     breast cancer    ROS is  negative for fever chills or night sweats. She denies constitutional symptoms and is without headache. She denies change in vision or significant weight. There are no hearing issues. She denies difficulty with swallowing or hoarseness. She is unaware of lymphadenopathy. She denies wheezing. She denies PND or orthopnea. She does note an occasional palpitation. She denies presyncope or syncope. She denies chest pressure. She did have a heart catheterization over 20 years ago. She denies nausea vomiting or diarrhea. She is unaware of blood in her stool or urine. She denies claudication symptoms. She denies paresthesias. She does have a history of hypothyroidism. She does have it mild lipid elevation noted in the past but has not been on therapy. She does have allergies and takes Zyrtec. She also does have arthritic issues and has taken Lodine. Hears a remote history of peptic ulcer disease but nothing active. Her sleep is nonrestorative. She does snore. She is unaware of restless legs. Other comprehensive 14 point system review is negative.  PE BP 180/100  Pulse 58  Ht 5\' 5"  (1.651 m)  Wt 246 lb 14.4 oz (111.993 kg)  BMI 41.09 kg/m2 Repeat blood pressure 150/88 when taken by me. General: Alert, oriented, no distress.  Skin: normal turgor, no rashes HEENT: Normocephalic, atraumatic. Pupils round and reactive; sclera anicteric; extraocular muscles are full; Fundi arterial narrowing Nose without nasal septal hypertrophy Mouth/Parynx benign; Mallinpatti scale 3 Neck: No JVD, no carotid bruits; normal carotid upstroke Lungs: clear to ausculatation and percussion; no wheezing or rales Chest wall: without tenderness to palpitation Heart: RRR, s1 s2 normal over 6 systolic murmur. Soft S4. No S3 gallop. Rare ectopic complex. Abdomen: soft, nontender; no hepatosplenomehaly, BS+; abdominal aorta nontender and not dilated by palpation. Back: no CVA tenderness Pulses 2+ Extremities: Mild lower extremity edema  at the ankles; no clubbinbg cyanosis, Homan's sign negative  Neurologic: grossly nonfocal; Cranial nerves grossly wnl Psychologic: Normal mood and affect    ECG: Sinus 58 beats per minute with occasional PACs normal intervals. Nonspecific ST changes.  LABS:  BMET    Component Value Date/Time   NA 141 10/13/2013 0846   K 5.0 10/13/2013 0846   CL 105 10/13/2013 0846   CO2 29 10/13/2013 0846   GLUCOSE 87 10/13/2013 0846   BUN 23 10/13/2013 0846   CREATININE 1.05 10/13/2013 0846   CREATININE 1.1 09/13/2013 0844   CALCIUM 9.4 10/13/2013 0846   GFRNONAA 57.99 06/29/2010 0000     Hepatic Function Panel     Component Value Date/Time   PROT 7.0 10/13/2013 0846   ALBUMIN 4.3 10/13/2013 0846   AST 15 10/13/2013 0846  ALT 14 10/13/2013 0846   ALKPHOS 85 10/13/2013 0846   BILITOT 1.0 10/13/2013 0846   BILIDIR 0.0 09/13/2013 0844     CBC    Component Value Date/Time   WBC 6.8 10/13/2013 0846   RBC 4.92 10/13/2013 0846   HGB 14.3 10/13/2013 0846   HCT 43.7 10/13/2013 0846   PLT 288 10/13/2013 0846   MCV 88.8 10/13/2013 0846   MCH 29.1 10/13/2013 0846   MCHC 32.7 10/13/2013 0846   RDW 15.0 10/13/2013 0846   LYMPHSABS 1.6 09/13/2013 0844   MONOABS 0.4 09/13/2013 0844   EOSABS 0.2 09/13/2013 0844   BASOSABS 0.0 09/13/2013 0844     BNP No results found for this basename: probnp    Lipid Panel     Component Value Date/Time   CHOL 180 10/13/2013 0846   TRIG 105 10/13/2013 0846   HDL 52 10/13/2013 0846   CHOLHDL 3.5 10/13/2013 0846   VLDL 21 10/13/2013 0846   LDLCALC 107* 10/13/2013 0846     RADIOLOGY: No results found.   ASSESSMENT AND PLAN: My impression is that [name] is a 72 year old female who has a 30-40 year history of hypertension. Recently, she has developed stage II hypertension and has been on medication including amlodipine 5 mg,  Bystolic 20 mg, and valsartan/HCTZ 320/25 mg. Initially today her blood pressure was 180/100 and this did improve  when rechecked by me 150/88. I have recommended a complete set of laboratory be obtained consisting of a CBC with differential, CMPl, lipid panel, and in light of her ectopy I will also check a magnesium level, thyroid function studies. I'm also checking urinalysis to make sure she is not spilling protein we'll also check a vitamin D level. I am recommending titration of her valsartan HCT to 320/25 mg. She does have mild edema and this should improve this. I'm also scheduling her for a 2-D echo Doppler study to evaluate both systolic as well as diastolic function. With her significant hypertensive history on multiple medical regimens also scheduling her for a renal duplex scan to make certain there is no renovascular etiology. I'm concerned also she may have obstructive sleep apnea which may also be attributing to her abnormal blood pressure elevation. I'm scheduling her for a diagnostic polysomnogram. I'll see her back in the office in several weeks for followup evaluation and further recommendations will be made at that time.   Lennette Bihari, MD, [facility] 10/31/2013 10:38 AM

## 2013-11-01 ENCOUNTER — Ambulatory Visit (HOSPITAL_COMMUNITY)
Admission: RE | Admit: 2013-11-01 | Discharge: 2013-11-01 | Disposition: A | Discharge status: Home / Self Care | Payer: Medicare Other | Source: Ambulatory Visit | Attending: Cardiovascular Disease | Admitting: Cardiovascular Disease

## 2013-11-01 DIAGNOSIS — I10 Essential (primary) hypertension: Secondary | ICD-10-CM | POA: Insufficient documentation

## 2013-11-01 DIAGNOSIS — R0609 Other forms of dyspnea: Secondary | ICD-10-CM

## 2013-11-01 DIAGNOSIS — R0989 Other specified symptoms and signs involving the circulatory and respiratory systems: Secondary | ICD-10-CM | POA: Diagnosis not present

## 2013-11-01 DIAGNOSIS — E785 Hyperlipidemia, unspecified: Secondary | ICD-10-CM | POA: Diagnosis not present

## 2013-11-01 DIAGNOSIS — R609 Edema, unspecified: Secondary | ICD-10-CM | POA: Diagnosis not present

## 2013-11-01 NOTE — Progress Notes (Signed)
2D Echo Performed 11/01/2013    Lilie Vezina, RCS  

## 2013-11-02 ENCOUNTER — Encounter: Payer: Self-pay | Admitting: Cardiovascular Disease

## 2013-11-02 ENCOUNTER — Ambulatory Visit (INDEPENDENT_AMBULATORY_CARE_PROVIDER_SITE_OTHER): Payer: Medicare Other | Admitting: Cardiovascular Disease

## 2013-11-02 ENCOUNTER — Ambulatory Visit
Admission: RE | Admit: 2013-11-02 | Discharge: 2013-11-02 | Disposition: A | Discharge status: Home / Self Care | Payer: Medicare Other | Source: Ambulatory Visit | Attending: Cardiovascular Disease | Admitting: Cardiovascular Disease

## 2013-11-02 ENCOUNTER — Telehealth: Payer: Self-pay | Admitting: Cardiovascular Disease

## 2013-11-02 ENCOUNTER — Encounter (HOSPITAL_COMMUNITY): Payer: Self-pay

## 2013-11-02 ENCOUNTER — Ambulatory Visit: Payer: Medicare Other | Admitting: Cardiovascular Disease

## 2013-11-02 VITALS — BP 190/92 | HR 56 | Ht 65.0 in | Wt 249.0 lb

## 2013-11-02 DIAGNOSIS — I10 Essential (primary) hypertension: Secondary | ICD-10-CM

## 2013-11-02 DIAGNOSIS — Z79899 Other long term (current) drug therapy: Secondary | ICD-10-CM

## 2013-11-02 DIAGNOSIS — D689 Coagulation defect, unspecified: Secondary | ICD-10-CM | POA: Diagnosis not present

## 2013-11-02 DIAGNOSIS — Z01818 Encounter for other preprocedural examination: Secondary | ICD-10-CM | POA: Diagnosis not present

## 2013-11-02 DIAGNOSIS — R911 Solitary pulmonary nodule: Secondary | ICD-10-CM | POA: Diagnosis not present

## 2013-11-02 LAB — CBC
HCT: 42.4 % (ref 36.0–46.0)
MCH: 28.7 pg (ref 26.0–34.0)
MCHC: 33 g/dL (ref 30.0–36.0)
MCV: 87.1 fL (ref 78.0–100.0)
Platelets: 300 10*3/uL (ref 150–400)
RDW: 14.5 % (ref 11.5–15.5)
WBC: 8.1 10*3/uL (ref 4.0–10.5)

## 2013-11-02 LAB — PROTIME-INR: INR: 1.02 (ref <1.50)

## 2013-11-02 LAB — BASIC METABOLIC PANEL
BUN: 27 mg/dL — ABNORMAL HIGH (ref 6–23)
CO2: 31 mEq/L (ref 19–32)
Creat: 1.06 mg/dL (ref 0.50–1.10)
Glucose, Bld: 89 mg/dL (ref 70–99)
Potassium: 4.5 mEq/L (ref 3.5–5.3)
Sodium: 140 mEq/L (ref 135–145)

## 2013-11-02 NOTE — Telephone Encounter (Signed)
Reported chest xray -- Report suspicious 2.2 cm LEFT upper lobe -pulm nodule radiologist recommend  CT CHEST  W/CONTRAST   SEE TODAY'S REPORT  Will forward to Dr Ester Rink RN

## 2013-11-02 NOTE — Telephone Encounter (Signed)
Needs Chest CT

## 2013-11-02 NOTE — Telephone Encounter (Signed)
Please advise 

## 2013-11-02 NOTE — Assessment & Plan Note (Signed)
The patient was sent to me by Dr. Daphene Jaeger for evaluation of renal artery stenosis a renal vascular hypertension. She's had difficult to control, resistant and labile hypertension over the recent months and has now been put on 4 antihypertensive medications with blood pressures in the 190-200 range over the low 100s. Recent renal Doppler study suggest hemodynamically significant right renal artery stenosis. I'm going to arrange for her to have angiography potential intervention.

## 2013-11-02 NOTE — Patient Instructions (Signed)
Dr. Allyson Sabal has ordered a renal angiogram to be done at [facility].  This procedure is going to look at the bloodflow in your lower extremities.  If Dr. Allyson Sabal is able to open up the arteries, you will have to spend one night in the hospital.  If he is not able to open the arteries, you will be able to go home that same day.    After the procedure, you will not be allowed to drive for 3 days or push, pull, or lift anything greater than 10 lbs for one week.    You will be required to have bloodwork and a chest xray prior to your procedure.  Our scheduler will advise you on when these items need to be done.

## 2013-11-02 NOTE — Progress Notes (Signed)
   11/02/2013 Yamila L Nordell   05/15/1941  [id number]  Primary Physician Katherine Tabori, MD Primary Cardiologist: Aliani Caccavale J. Dameion Briles MD FACP,FACC,FAHA, FSCAI   HPI:  Ms. Wadding has a long-standing history of hypertension for at least 30-40 years. She initially was treated with diuretic therapy. She also states that approximately 20 years ago she had undergone a cardiac catheterization and was not told of having significant coronary artery blockage. Additional problems have included significant arthritis and she has undergone knee arthroscopic surgery. She has a history of hypothyroidism, remote peptic ulcer disease in the 1980s, as well as GERD. She also does note palpitations typically in the evening. She was recently seen by Dr. Kathryn Tabori and at that time her blood pressure had been fluctuating ranging from 1:30 to 180 systolically and 70 to 90s diastolically despite being on her medical regimen which consisted of amlodipine 5 mg, Bystolic 20 mg, and Diovan HCT 320/12.5 mg.Additional problems also include hypothyroidism, osteoporosis, mild hyperlipidemia for which she's been followed medically. She sought Dr. Tom Kelly earlier this month for evaluation. He ordered a renal Doppler study that suggested a hemodynamically significant right renal artery stenosis and the patient was referred to me for evaluation of renal vascular hypertension.   Current Outpatient Prescriptions  Medication Sig Dispense Refill  . amLODipine (NORVASC) 5 MG tablet Take 1 tablet (5 mg total) by mouth daily.  30 tablet  3  . aspirin 81 MG tablet Take 81 mg by mouth 3 (three) times a week.      . Calcium 600-200 MG-UNIT per tablet Take 1 tablet by mouth 2 (two) times daily.       . cetirizine (ZYRTEC) 10 MG tablet Take 10 mg by mouth daily.        . etodolac (LODINE) 400 MG tablet Take ONE tablet by mouth two times daily  120 tablet  3  . fluticasone (FLONASE) 50 MCG/ACT nasal spray USE TWO SPRAY IN EACH NOSTRIL  EVERY DAY  16 g  6  . lansoprazole (PREVACID) 15 MG capsule Take 15 mg by mouth daily.        . levothyroxine (SYNTHROID, LEVOTHROID) 75 MCG tablet TAKE ONE TABLET BY MOUTH EVERY DAY  90 tablet  1  . Multiple Vitamins-Minerals (MULTI FOR HER 50+ PO) Take by mouth daily.        . Nebivolol HCl (BYSTOLIC) 20 MG TABS Take 1 tablet (20 mg total) by mouth daily.  30 tablet  3  . valsartan-hydrochlorothiazide (DIOVAN-HCT) 320-25 MG per tablet Take 1 tablet by mouth daily.  90 tablet  3  . albuterol (PROAIR HFA) 108 (90 BASE) MCG/ACT inhaler Inhale 2 puffs into the lungs every 6 (six) hours as needed for wheezing or shortness of breath.  1 Inhaler  3   No current facility-administered medications for this visit.    Allergies  Allergen Reactions  . Benazepril Hcl     REACTION: cough  . Celecoxib     REACTION: aching  . Lisinopril     REACTION: cough  . Loratadine   . Allegra [Fexofenadine Hcl]   . Sulfa Antibiotics     History   Social History  . Marital Status: Married    Spouse Name: N/A    Number of Children: N/A  . Years of Education: N/A   Occupational History  . Not on file.   Social History Main Topics  . Smoking status: Never Smoker   . Smokeless tobacco: Never Used  . Alcohol   Use: No  . Drug Use: No  . Sexual Activity: Not on file   Other Topics Concern  . Not on file   Social History Narrative  . No narrative on file     Review of Systems: General: negative for chills, fever, night sweats or weight changes.  Cardiovascular: negative for chest pain, dyspnea on exertion, edema, orthopnea, palpitations, paroxysmal nocturnal dyspnea or shortness of breath Dermatological: negative for rash Respiratory: negative for cough or wheezing Urologic: negative for hematuria Abdominal: negative for nausea, vomiting, diarrhea, bright red blood per rectum, melena, or hematemesis Neurologic: negative for visual changes, syncope, or dizziness All other systems reviewed and are  otherwise negative except as noted above.    Blood pressure 190/92, pulse 56, height 5' 5" (1.651 m), weight 249 lb (112.946 kg).  General appearance: alert and no distress Neck: no adenopathy, no carotid bruit, no JVD, supple, symmetrical, trachea midline and thyroid not enlarged, symmetric, no tenderness/mass/nodules Lungs: clear to auscultation bilaterally Heart: regular rate and rhythm, S1, S2 normal, no murmur, click, rub or gallop Extremities: extremities normal, atraumatic, no cyanosis or edema  EKG not performed today  ASSESSMENT AND PLAN:   HYPERTENSION The patient was sent to me by Dr. Tom Kelly for evaluation of renal artery stenosis a renal vascular hypertension. She's had difficult to control, resistant and labile hypertension over the recent months and has now been put on 4 antihypertensive medications with blood pressures in the 190-200 range over the low 100s. Recent renal Doppler study suggest hemodynamically significant right renal artery stenosis. I'm going to arrange for her to have angiography potential intervention.      Brailey Buescher J. Lamir Racca MD FACP,FACC,FAHA, FSCAI 11/02/2013 10:34 AM  

## 2013-11-03 ENCOUNTER — Other Ambulatory Visit: Payer: Self-pay | Admitting: *Deleted

## 2013-11-03 ENCOUNTER — Encounter: Payer: Self-pay | Admitting: Cardiovascular Disease

## 2013-11-03 DIAGNOSIS — R9389 Abnormal findings on diagnostic imaging of other specified body structures: Secondary | ICD-10-CM

## 2013-11-03 NOTE — Telephone Encounter (Signed)
Patient notified of results Pt is agreeable to proceed with CT

## 2013-11-05 ENCOUNTER — Ambulatory Visit
Admission: RE | Admit: 2013-11-05 | Discharge: 2013-11-05 | Disposition: A | Discharge status: Home / Self Care | Payer: Medicare Other | Source: Ambulatory Visit | Attending: Cardiovascular Disease | Admitting: Cardiovascular Disease

## 2013-11-05 ENCOUNTER — Telehealth: Payer: Self-pay | Admitting: *Deleted

## 2013-11-05 DIAGNOSIS — R222 Localized swelling, mass and lump, trunk: Secondary | ICD-10-CM | POA: Diagnosis not present

## 2013-11-05 DIAGNOSIS — R9389 Abnormal findings on diagnostic imaging of other specified body structures: Secondary | ICD-10-CM

## 2013-11-05 MED ORDER — IOHEXOL 300 MG/ML  SOLN
75.0000 mL | Freq: Once | INTRAMUSCULAR | Status: AC | PRN
  Administered 2013-11-05: 75 mL via INTRAVENOUS

## 2013-11-05 NOTE — Telephone Encounter (Signed)
Took CT Chest results via phone from [facility] - results to be faxed to office.   49mm ill-defined mass in superior segment of left lower lobe - appearance is typical of non-small cell lung cancer  Results sent to Dr. Cecil Cobbs, RN

## 2013-11-08 ENCOUNTER — Encounter (HOSPITAL_COMMUNITY): Payer: Self-pay | Admitting: General Practice

## 2013-11-08 ENCOUNTER — Encounter (HOSPITAL_COMMUNITY)
Admission: RE | Disposition: A | Discharge status: Home / Self Care | Payer: Medicare Other | Source: Ambulatory Visit | Attending: Cardiovascular Disease

## 2013-11-08 ENCOUNTER — Ambulatory Visit (HOSPITAL_COMMUNITY)
Admission: RE | Admit: 2013-11-08 | Discharge: 2013-11-09 | Disposition: A | Discharge status: Home / Self Care | Payer: Medicare Other | Source: Ambulatory Visit | Attending: Cardiovascular Disease | Admitting: Cardiovascular Disease

## 2013-11-08 DIAGNOSIS — R911 Solitary pulmonary nodule: Secondary | ICD-10-CM

## 2013-11-08 DIAGNOSIS — K219 Gastro-esophageal reflux disease without esophagitis: Secondary | ICD-10-CM | POA: Insufficient documentation

## 2013-11-08 DIAGNOSIS — E039 Hypothyroidism, unspecified: Secondary | ICD-10-CM | POA: Diagnosis not present

## 2013-11-08 DIAGNOSIS — E785 Hyperlipidemia, unspecified: Secondary | ICD-10-CM | POA: Diagnosis not present

## 2013-11-08 DIAGNOSIS — Z8711 Personal history of peptic ulcer disease: Secondary | ICD-10-CM | POA: Insufficient documentation

## 2013-11-08 DIAGNOSIS — I10 Essential (primary) hypertension: Secondary | ICD-10-CM | POA: Diagnosis not present

## 2013-11-08 DIAGNOSIS — I701 Atherosclerosis of renal artery: Secondary | ICD-10-CM

## 2013-11-08 DIAGNOSIS — Z7982 Long term (current) use of aspirin: Secondary | ICD-10-CM | POA: Insufficient documentation

## 2013-11-08 DIAGNOSIS — Z01818 Encounter for other preprocedural examination: Secondary | ICD-10-CM

## 2013-11-08 HISTORY — DX: Other nonspecific abnormal finding of lung field: R91.8

## 2013-11-08 HISTORY — DX: Solitary pulmonary nodule: R91.1

## 2013-11-08 HISTORY — DX: Pneumonia, unspecified organism: J18.9

## 2013-11-08 HISTORY — DX: Basal cell carcinoma of skin, unspecified: C44.91

## 2013-11-08 HISTORY — PX: RENAL ANGIOGRAM: SHX5509

## 2013-11-08 HISTORY — PX: RENAL ARTERY STENT: SHX2321

## 2013-11-08 HISTORY — PX: PERCUTANEOUS STENT INTERVENTION: SHX5500

## 2013-11-08 LAB — POCT I-STAT, CHEM 8
BUN: 28 mg/dL — ABNORMAL HIGH (ref 6–23)
Calcium, Ion: 1.25 mmol/L (ref 1.13–1.30)
Chloride: 103 mEq/L (ref 96–112)
Creatinine, Ser: 1.3 mg/dL — ABNORMAL HIGH (ref 0.50–1.10)
Glucose, Bld: 89 mg/dL (ref 70–99)
HCT: 43 % (ref 36.0–46.0)
Hemoglobin: 14.6 g/dL (ref 12.0–15.0)
Potassium: 4.2 mEq/L (ref 3.7–5.3)
Sodium: 143 mEq/L (ref 137–147)
TCO2: 30 mmol/L (ref 0–100)

## 2013-11-08 LAB — POCT ACTIVATED CLOTTING TIME
ACTIVATED CLOTTING TIME: 121 s
Activated Clotting Time: 204 seconds

## 2013-11-08 SURGERY — RENAL ANGIOGRAM
Anesthesia: LOCAL | Laterality: Right

## 2013-11-08 MED ORDER — FENTANYL CITRATE 0.05 MG/ML IJ SOLN
INTRAMUSCULAR | Status: AC
  Filled 2013-11-08: qty 2

## 2013-11-08 MED ORDER — AMLODIPINE BESYLATE 5 MG PO TABS
5.0000 mg | ORAL_TABLET | Freq: Every day | ORAL | Status: DC
  Administered 2013-11-08: 11:00:00 5 mg via ORAL
  Filled 2013-11-08: qty 1

## 2013-11-08 MED ORDER — HYDROCHLOROTHIAZIDE 25 MG PO TABS
25.0000 mg | ORAL_TABLET | Freq: Every day | ORAL | Status: DC
Start: 2013-11-09 — End: 2013-11-09
  Administered 2013-11-08 – 2013-11-09 (×2): 25 mg via ORAL
  Filled 2013-11-08: qty 1

## 2013-11-08 MED ORDER — ONDANSETRON HCL 4 MG/2ML IJ SOLN
4.0000 mg | Freq: Four times a day (QID) | INTRAMUSCULAR | Status: DC | PRN
  Administered 2013-11-08: 14:00:00 4 mg via INTRAVENOUS
  Filled 2013-11-08 (×2): qty 2

## 2013-11-08 MED ORDER — SODIUM CHLORIDE 0.9 % IV SOLN: INTRAVENOUS | Status: AC

## 2013-11-08 MED ORDER — CLOPIDOGREL BISULFATE 300 MG PO TABS
ORAL_TABLET | ORAL | Status: AC
  Filled 2013-11-08: qty 1

## 2013-11-08 MED ORDER — LEVOTHYROXINE SODIUM 88 MCG PO TABS
88.0000 ug | ORAL_TABLET | Freq: Every day | ORAL | Status: DC
  Administered 2013-11-09: 06:00:00 88 ug via ORAL
  Filled 2013-11-08 (×2): qty 1

## 2013-11-08 MED ORDER — PANTOPRAZOLE SODIUM 20 MG PO TBEC
20.0000 mg | DELAYED_RELEASE_TABLET | Freq: Every day | ORAL | Status: DC
  Administered 2013-11-08 – 2013-11-09 (×2): 20 mg via ORAL
  Filled 2013-11-08: qty 1

## 2013-11-08 MED ORDER — MIDAZOLAM HCL 2 MG/2ML IJ SOLN
INTRAMUSCULAR | Status: AC
  Filled 2013-11-08: qty 2

## 2013-11-08 MED ORDER — HYDRALAZINE HCL 20 MG/ML IJ SOLN
INTRAMUSCULAR | Status: AC
  Filled 2013-11-08: qty 1

## 2013-11-08 MED ORDER — ACETAMINOPHEN 325 MG PO TABS: 650.0000 mg | ORAL_TABLET | ORAL | Status: DC | PRN

## 2013-11-08 MED ORDER — SODIUM CHLORIDE 0.9 % IV SOLN
INTRAVENOUS | Status: DC
  Administered 2013-11-08: 07:00:00 via INTRAVENOUS

## 2013-11-08 MED ORDER — DIAZEPAM 5 MG PO TABS
ORAL_TABLET | ORAL | Status: AC
  Filled 2013-11-08: qty 1

## 2013-11-08 MED ORDER — NEBIVOLOL HCL 10 MG PO TABS
20.0000 mg | ORAL_TABLET | Freq: Every day | ORAL | Status: DC
  Administered 2013-11-08 – 2013-11-09 (×2): 20 mg via ORAL
  Filled 2013-11-08: qty 2

## 2013-11-08 MED ORDER — ASPIRIN 81 MG PO CHEW: 81.0000 mg | CHEWABLE_TABLET | ORAL | Status: DC

## 2013-11-08 MED ORDER — SODIUM CHLORIDE 0.9 % IJ SOLN: 3.0000 mL | INTRAMUSCULAR | Status: DC | PRN

## 2013-11-08 MED ORDER — HEPARIN (PORCINE) IN NACL 2-0.9 UNIT/ML-% IJ SOLN
INTRAMUSCULAR | Status: AC
  Filled 2013-11-08: qty 1000

## 2013-11-08 MED ORDER — HYDRALAZINE HCL 20 MG/ML IJ SOLN
10.0000 mg | INTRAMUSCULAR | Status: DC
  Administered 2013-11-08: 10 mg via INTRAVENOUS
  Filled 2013-11-08 (×2): qty 1

## 2013-11-08 MED ORDER — FLUTICASONE PROPIONATE 50 MCG/ACT NA SUSP: 2.0000 | Freq: Every day | NASAL | Status: DC | PRN

## 2013-11-08 MED ORDER — VALSARTAN-HYDROCHLOROTHIAZIDE 320-25 MG PO TABS: 1.0000 | ORAL_TABLET | Freq: Every day | ORAL | Status: DC

## 2013-11-08 MED ORDER — ASPIRIN EC 81 MG PO TBEC: 81.0000 mg | DELAYED_RELEASE_TABLET | ORAL | Status: DC

## 2013-11-08 MED ORDER — FAMOTIDINE IN NACL 20-0.9 MG/50ML-% IV SOLN
INTRAVENOUS | Status: AC
  Filled 2013-11-08: qty 50

## 2013-11-08 MED ORDER — IRBESARTAN 300 MG PO TABS
300.0000 mg | ORAL_TABLET | Freq: Every day | ORAL | Status: DC
  Administered 2013-11-08 – 2013-11-09 (×2): 300 mg via ORAL
  Filled 2013-11-08: qty 1

## 2013-11-08 MED ORDER — DIAZEPAM 5 MG PO TABS
5.0000 mg | ORAL_TABLET | ORAL | Status: AC
  Administered 2013-11-08: 5 mg via ORAL

## 2013-11-08 MED ORDER — CETIRIZINE HCL 10 MG PO TABS
10.0000 mg | ORAL_TABLET | Freq: Every day | ORAL | Status: DC
  Administered 2013-11-08: 10 mg via ORAL
  Filled 2013-11-08 (×3): qty 1

## 2013-11-08 MED ORDER — HEPARIN SODIUM (PORCINE) 1000 UNIT/ML IJ SOLN
INTRAMUSCULAR | Status: AC
  Filled 2013-11-08: qty 1

## 2013-11-08 MED ORDER — ETODOLAC 400 MG PO TABS
400.0000 mg | ORAL_TABLET | Freq: Two times a day (BID) | ORAL | Status: DC
  Administered 2013-11-08 – 2013-11-09 (×3): 400 mg via ORAL
  Filled 2013-11-08 (×3): qty 1

## 2013-11-08 MED ORDER — LIDOCAINE HCL (PF) 1 % IJ SOLN
INTRAMUSCULAR | Status: AC
  Filled 2013-11-08: qty 30

## 2013-11-08 MED ORDER — ASPIRIN EC 325 MG PO TBEC
325.0000 mg | DELAYED_RELEASE_TABLET | Freq: Every day | ORAL | Status: DC
  Administered 2013-11-08 – 2013-11-09 (×2): 325 mg via ORAL
  Filled 2013-11-08 (×2): qty 1

## 2013-11-08 MED ORDER — CLOPIDOGREL BISULFATE 75 MG PO TABS
75.0000 mg | ORAL_TABLET | Freq: Every day | ORAL | Status: DC
  Administered 2013-11-09: 75 mg via ORAL
  Filled 2013-11-08: qty 1

## 2013-11-08 MED ORDER — MORPHINE SULFATE 2 MG/ML IJ SOLN
1.0000 mg | INTRAMUSCULAR | Status: DC | PRN
  Administered 2013-11-08: 14:00:00 1 mg via INTRAVENOUS
  Filled 2013-11-08: qty 1

## 2013-11-08 NOTE — Progress Notes (Signed)
Site area: right groin  Site Prior to Removal:  Level 0  Pressure Applied For 20 MINUTES    Minutes Beginning at 1300  Manual:   yes  Patient Status During Pull:  stable  Post Pull Groin Site:  Level 0  Post Pull Instructions Given:  yes  Post Pull Pulses Present:  yes  Dressing Applied:  yes  Comments:

## 2013-11-08 NOTE — Interval H&P Note (Signed)
History and Physical Interval Note:  11/08/2013 7:40 AM  Susan Pittman  has presented today for surgery, with the diagnosis of Renal artery stenosis  The various methods of treatment have been discussed with the patient and family. After consideration of risks, benefits and other options for treatment, the patient has consented to  Procedure(s): RENAL ANGIOGRAM (N/A) as a surgical intervention .  The patient's history has been reviewed, patient examined, no change in status, stable for surgery.  I have reviewed the patient's chart and labs.  Questions were answered to the patient's satisfaction.     Lorretta Harp

## 2013-11-08 NOTE — CV Procedure (Signed)
Susan Pittman is a 73 y.o. female    [id number] LOCATION:  FACILITY: [facility]  PHYSICIAN: Quay Burow, M.D. May 18, 1941   DATE OF PROCEDURE:  11/08/2013  DATE OF DISCHARGE:     PV Angiogram/Intervention    History obtained from chart review.Susan Pittman has a long-standing history of hypertension for at least 30-40 years. She initially was treated with diuretic therapy. She also states that approximately 20 years ago she had undergone a cardiac catheterization and was not told of having significant coronary artery blockage. Additional problems have included significant arthritis and she has undergone knee arthroscopic surgery. She has a history of hypothyroidism, remote peptic ulcer disease in the 1980s, as well as GERD. She also does note palpitations typically in the evening. She was recently seen by Dr. Mable Paris and at that time her blood pressure had been fluctuating ranging from 676 to 195 systolically and 70 to 09T diastolically despite being on her medical regimen which consisted of amlodipine 5 mg, Bystolic 20 mg, and Diovan HCT 320/12.5 mg.Additional problems also include hypothyroidism, osteoporosis, mild hyperlipidemia for which she's been followed medically. She saw Dr. Ellouise Newer earlier this month for evaluation. He ordered a renal Doppler study that suggested a hemodynamically significant right renal artery stenosis and the patient was referred to me for evaluation of renal vascular hypertension.    PROCEDURE DESCRIPTION:   The patient was brought to the second floor [facility] cath lab in the postabsorptive state. She was premedicated with Valium 5 mg by mouth, IV Versed and fentanyl. Her right groinwas prepped and shaved in usual sterile fashion. Xylocaine 1% was used for local anesthesia. A 6 French sheath was inserted into the right common femoral  artery using standard Seldinger technique. A 5 French pigtail catheter was used for midstream abdominal aortography.  A 6 French LIMA guide catheter was used for selective right renal angiography. Visipaque dye was used for the entirety of the case (79 cc administered to the patient). Retrograde aortic pressure was monitored during the case.a total of 6500 units of heparin was administered to an ACT of 221   HEMODYNAMICS:    AO SYSTOLIC/AO DIASTOLIC: 267/12   Angiographic Data: midstream abdominal aortography revealed an 80% ostial right renal artery stenosis. Left renal artery is widely patent. The aorta appeared free of significant atherosclerotic disease.  IMPRESSION:80% right renal artery stenosis consistent with the renal Doppler findings. We'll proceed with renal artery stenting for presumed renal vascular hypertension  Procedure Description:using a 6 French LIMA guide catheter along with an 014 stabilizer wire and a 4 mm x 2 cm predilatation balloon angioplasty was performed at the ostium of the right renal artery. Stenting was then performed with a 5.5 mm x 15 mm long Abbott Herculink balloon expandable stent at 12 atmospheres (5.4   mm) resulting in reduction of an 80% ostial right renal artery stenosis to 0% residual.  Final Impression: successful right renal artery PTA and stent using a balloon expandable stent with excellent angiographic result and 0% residual stenosis. The guidewire and catheter were were removed and the sheath was secured. The patient left the Cath Lab in stable condition. The sheath will be removed once the ACT falls below 170 and pressure will be held on the groin. She will be treated with aspirin Plavix. She'll be hydrated overnight and discharged home in the morning. She will obtain followup outpatient renal Doppler studies in our office and then see me back after that.    Roniqua Kintz J.  MD, [facility] 11/08/2013 8:42 AM

## 2013-11-08 NOTE — Consult Note (Signed)
Name: Susan Pittman MRN: [id number] DOB: December 17, 1940    ADMISSION DATE:  11/08/2013 CONSULTATION DATE:  11/08/12  REFERRING MD :  Dr. Gwenlyn Found PRIMARY SERVICE:  Cardiology  CHIEF COMPLAINT:  Abnormal CT Chest  BRIEF PATIENT DESCRIPTION: 73 y/o F admitted for planned angiogram to assess for renal artery stenosis for renal vascular hypertension.  Screening CXR revealed LLL pulmonary nodule, prompting CT Chest.    SIGNIFICANT EVENTS / STUDIES:  1/2 - CT Chest:  21 mm ill defined mass in superior segment of LLL ..............................................................................................................Marland Kitchen 1/5 - Admit for angiogram to assess for renal artery stenosis, 80% R renal artery stenosis with stenting    HISTORY OF PRESENT ILLNESS:  73 y/o F, never smoker, with PMH of GERD, Allergies, Hypothyroidism, DJD, HTN who was admitted for planned angiogram to assess for renal artery stenosis for renal vascular hypertension.  Patient had a screening CXR prior to procedure and was noted to have a pulmonary nodule in LLL.  Nodule further assessed with CT Chest prior to presentation, demonstrating a 21 mm ill defined mass in superior segment of LLL.    Patient reports growing up in the [place].  Retired Secondary school teacher.  Exposed to passive cigarette smoke per husband.  Home has wood burning fireplace.  She denies infectious symptoms to include fevers, chills, cough, sputum production. Denies chest pain, weight loss, night sweats, infected teeth, poor appetite.  No recent travel in past few months.  Did travel to [place] in last year.   PCCM consulted for pulmonary evaluation.   PAST MEDICAL HISTORY :  Past Medical History  Diagnosis Date  . Asthma   . Hypertension   . GERD (gastroesophageal reflux disease)   . Allergy   . Arthritis   . Hypothyroid   . Osteoporosis   . DJD (degenerative joint disease)   . Gastric ulcer   . Hiatal hernia   . Hyperlipidemia   . Renal artery stenosis     Past Surgical History  Procedure Laterality Date  . Knee arthroscopy      R knee and L knee  . Neck surgery    . Cholecystectomy    . Abdominal hysterectomy    . Spinal fusion      C5-C6   Prior to Admission medications   Medication Sig Start Date End Date Taking? Authorizing Provider  amLODipine (NORVASC) 5 MG tablet Take 1 tablet (5 mg total) by mouth daily. 08/19/13  Yes [name], MD  aspirin EC 81 MG tablet Take 81 mg by mouth every Monday, Wednesday, and Friday.   Yes Historical Provider, MD  Calcium 600-200 MG-UNIT per tablet Take 1 tablet by mouth 2 (two) times daily. Takes at [employer] and Evening   Yes Historical Provider, MD  cetirizine (ZYRTEC) 10 MG tablet Take 10 mg by mouth at bedtime.    Yes Historical Provider, MD  etodolac (LODINE) 400 MG tablet Take 400 mg by mouth 2 (two) times daily.   Yes Historical Provider, MD  fluticasone (FLONASE) 50 MCG/ACT nasal spray Place 2 sprays into both nostrils daily as needed for allergies or rhinitis.   Yes Historical Provider, MD  lansoprazole (PREVACID) 15 MG capsule Take 15 mg by mouth daily.     Yes Historical Provider, MD  levothyroxine (SYNTHROID, LEVOTHROID) 88 MCG tablet Take 88 mcg by mouth daily before breakfast.   Yes Historical Provider, MD  Multiple Vitamins-Minerals (MULTI FOR HER 50+ PO) Take 1 tablet by mouth daily.    Yes Historical Provider, MD  Nebivolol HCl 20 MG TABS Take 1 tablet by mouth daily. 08/13/13  Yes [name], MD  valsartan-hydrochlorothiazide (DIOVAN-HCT) 320-25 MG per tablet Take 1 tablet by mouth daily. 10/13/13  Yes Troy Sine, MD   Allergies  Allergen Reactions  . Benazepril Hcl Anaphylaxis and Cough  . Celecoxib     REACTION: aching  . Lisinopril     REACTION: cough  . Loratadine     anaphylaxis  . Allegra [Fexofenadine Hcl]     Severe back pain  . Sulfa Antibiotics Hives    FAMILY HISTORY:  Family History  Problem Relation Age of Onset  . Cancer Sister     breast  cancer   SOCIAL HISTORY:  reports that she has never smoked. She has never used smokeless tobacco. She reports that she does not drink alcohol or use illicit drugs.  REVIEW OF SYSTEMS:   Constitutional: Negative for fever, chills, weight loss, malaise/fatigue and diaphoresis.  HENT: Negative for hearing loss, ear pain, nosebleeds, congestion, sore throat, neck pain, tinnitus and ear discharge.   Eyes: Negative for blurred vision, double vision, photophobia, pain, discharge and redness.  Respiratory: Negative for cough, hemoptysis, sputum production, shortness of breath, wheezing and stridor.   Cardiovascular: Negative for chest pain, palpitations, orthopnea, claudication, leg swelling and PND.  Gastrointestinal: Negative for heartburn, nausea, vomiting, abdominal pain, diarrhea, constipation, blood in stool and melena.  Genitourinary: Negative for dysuria, urgency, frequency, hematuria and flank pain.  Musculoskeletal: Negative for myalgias, back pain, joint pain and falls.  Skin: Negative for itching and rash.  Neurological: Negative for dizziness, tingling, tremors, sensory change, speech change, focal weakness, seizures, loss of consciousness, weakness and headaches.  Endo/Heme/Allergies: Negative for environmental allergies and polydipsia. Does not bruise/bleed easily.  SUBJECTIVE:   VITAL SIGNS: Temp:  [97.6 F (36.4 C)] 97.6 F (36.4 C) (01/05 0619) Pulse Rate:  [63-96] 63 (01/05 0745) Resp:  [18] 18 (01/05 0619) BP: (161)/(58) 161/58 mmHg (01/05 0619) SpO2:  [99 %] 99 % (01/05 0619) Weight:  [249 lb (112.946 kg)] 249 lb (112.946 kg) (01/05 6144)  PHYSICAL EXAMINATION: General:  wdwn adult female in NAD Neuro:  AAOx4, speech clear, MAE HEENT:  Mm pink/moist, good dentition Cardiovascular:  s1s2 rrr, no m/r/g Lungs:  resp's even/non-labored, lungs bilaterally clear Abdomen:  Obese, soft, bsx4 active Musculoskeletal:  No acute deformities Skin:  Warm/dry, no  edema   Recent Labs Lab 11/02/13 1123 11/08/13 0643  NA 140 143  K 4.5 4.2  CL 102 103  CO2 31  --   BUN 27* 28*  CREATININE 1.06 1.30*  GLUCOSE 89 89    Recent Labs Lab 11/02/13 1123 11/08/13 0643  HGB 14.0 14.6  HCT 42.4 43.0  WBC 8.1  --   PLT 300  --    No results found.  ASSESSMENT / PLAN:  LLL nodule It appears to be a combination of solid nodule and ground glass opacity (GGO). In this female / life long nonsmoker is possibly bronchoalveolar carcinoma ( BAC), if it is it would not demonstrate FDG uptake on PET scan and is likely slow growing. It is likely to be accessible via transthoracic needle biopsy or possibly via  electromagnetic navigational bronchoscopy; in any case positive biopsy will require resection and negative biopsy would always imply the possibility of false negative result.     -->  Would recommend scheduling outpatient PET scan -->  If the lesion is not FDG avid and conservative management is desired by the  patient would follow with serial imaging -->  If there are no other FDG avid leions and aggressive management is desired would refer to Thoracic Surgery for wedge resection  Noe Gens, NP-C [facility] Pgr: [phone] or [phone]  I have personally obtained history, examined patient, evaluated and interpreted laboratory and imaging results, reviewed medical records, formulated assessment / plan and placed orders.  Doree Fudge, MD Pulmonary and [place] Pager: [phone]  11/08/2013, 8:23 PM    11/08/2013, 11:11 AM

## 2013-11-08 NOTE — H&P (View-Only) (Signed)
11/02/2013 Susan Pittman   10/07/1941  [id number]  Primary Physician [name], MD Primary Cardiologist: Lorretta Harp MD Renae Gloss   HPI:  Susan Pittman has a long-standing history of hypertension for at least 30-40 years. She initially was treated with diuretic therapy. She also states that approximately 20 years ago she had undergone a cardiac catheterization and was not told of having significant coronary artery blockage. Additional problems have included significant arthritis and she has undergone knee arthroscopic surgery. She has a history of hypothyroidism, remote peptic ulcer disease in the 1980s, as well as GERD. She also does note palpitations typically in the evening. She was recently seen by Dr. Mable Paris and at that time her blood pressure had been fluctuating ranging from 8:93 to 734 systolically and 70 to 28J diastolically despite being on her medical regimen which consisted of amlodipine 5 mg, Bystolic 20 mg, and Diovan HCT 320/12.5 mg.Additional problems also include hypothyroidism, osteoporosis, mild hyperlipidemia for which she's been followed medically. She sought Dr. Ellouise Newer earlier this month for evaluation. He ordered a renal Doppler study that suggested a hemodynamically significant right renal artery stenosis and the patient was referred to me for evaluation of renal vascular hypertension.   Current Outpatient Prescriptions  Medication Sig Dispense Refill  . amLODipine (NORVASC) 5 MG tablet Take 1 tablet (5 mg total) by mouth daily.  30 tablet  3  . aspirin 81 MG tablet Take 81 mg by mouth 3 (three) times a week.      . Calcium 600-200 MG-UNIT per tablet Take 1 tablet by mouth 2 (two) times daily.       . cetirizine (ZYRTEC) 10 MG tablet Take 10 mg by mouth daily.        Marland Kitchen etodolac (LODINE) 400 MG tablet Take ONE tablet by mouth two times daily  120 tablet  3  . fluticasone (FLONASE) 50 MCG/ACT nasal spray USE TWO SPRAY IN EACH NOSTRIL  EVERY DAY  16 g  6  . lansoprazole (PREVACID) 15 MG capsule Take 15 mg by mouth daily.        Marland Kitchen levothyroxine (SYNTHROID, LEVOTHROID) 75 MCG tablet TAKE ONE TABLET BY MOUTH EVERY DAY  90 tablet  1  . Multiple Vitamins-Minerals (MULTI FOR HER 50+ PO) Take by mouth daily.        . Nebivolol HCl (BYSTOLIC) 20 MG TABS Take 1 tablet (20 mg total) by mouth daily.  30 tablet  3  . valsartan-hydrochlorothiazide (DIOVAN-HCT) 320-25 MG per tablet Take 1 tablet by mouth daily.  90 tablet  3  . albuterol (PROAIR HFA) 108 (90 BASE) MCG/ACT inhaler Inhale 2 puffs into the lungs every 6 (six) hours as needed for wheezing or shortness of breath.  1 Inhaler  3   No current facility-administered medications for this visit.    Allergies  Allergen Reactions  . Benazepril Hcl     REACTION: cough  . Celecoxib     REACTION: aching  . Lisinopril     REACTION: cough  . Loratadine   . Allegra [Fexofenadine Hcl]   . Sulfa Antibiotics     History   Social History  . Marital Status: Married    Spouse Name: N/A    Number of Children: N/A  . Years of Education: N/A   Occupational History  . Not on file.   Social History Main Topics  . Smoking status: Never Smoker   . Smokeless tobacco: Never Used  . Alcohol  Use: No  . Drug Use: No  . Sexual Activity: Not on file   Other Topics Concern  . Not on file   Social History Narrative  . No narrative on file     Review of Systems: General: negative for chills, fever, night sweats or weight changes.  Cardiovascular: negative for chest pain, dyspnea on exertion, edema, orthopnea, palpitations, paroxysmal nocturnal dyspnea or shortness of breath Dermatological: negative for rash Respiratory: negative for cough or wheezing Urologic: negative for hematuria Abdominal: negative for nausea, vomiting, diarrhea, bright red blood per rectum, melena, or hematemesis Neurologic: negative for visual changes, syncope, or dizziness All other systems reviewed and are  otherwise negative except as noted above.    Blood pressure 190/92, pulse 56, height 5\' 5"  (1.651 m), weight 249 lb (112.946 kg).  General appearance: alert and no distress Neck: no adenopathy, no carotid bruit, no JVD, supple, symmetrical, trachea midline and thyroid not enlarged, symmetric, no tenderness/mass/nodules Lungs: clear to auscultation bilaterally Heart: regular rate and rhythm, S1, S2 normal, no murmur, click, rub or gallop Extremities: extremities normal, atraumatic, no cyanosis or edema  EKG not performed today  ASSESSMENT AND PLAN:   HYPERTENSION The patient was sent to me by Dr. Ellouise Newer for evaluation of renal artery stenosis a renal vascular hypertension. She's had difficult to control, resistant and labile hypertension over the recent months and has now been put on 4 antihypertensive medications with blood pressures in the 190-200 range over the low 100s. Recent renal Doppler study suggest hemodynamically significant right renal artery stenosis. I'm going to arrange for her to have angiography potential intervention.      Lorretta Harp MD FACP,FACC,FAHA, [facility] 11/02/2013 10:34 AM

## 2013-11-09 DIAGNOSIS — R911 Solitary pulmonary nodule: Secondary | ICD-10-CM | POA: Diagnosis not present

## 2013-11-09 DIAGNOSIS — E785 Hyperlipidemia, unspecified: Secondary | ICD-10-CM | POA: Diagnosis not present

## 2013-11-09 DIAGNOSIS — I10 Essential (primary) hypertension: Secondary | ICD-10-CM | POA: Diagnosis not present

## 2013-11-09 DIAGNOSIS — I701 Atherosclerosis of renal artery: Secondary | ICD-10-CM | POA: Diagnosis not present

## 2013-11-09 DIAGNOSIS — E039 Hypothyroidism, unspecified: Secondary | ICD-10-CM | POA: Diagnosis not present

## 2013-11-09 DIAGNOSIS — K219 Gastro-esophageal reflux disease without esophagitis: Secondary | ICD-10-CM | POA: Diagnosis not present

## 2013-11-09 LAB — BASIC METABOLIC PANEL
BUN: 31 mg/dL — ABNORMAL HIGH (ref 6–23)
CO2: 25 meq/L (ref 19–32)
Calcium: 8.7 mg/dL (ref 8.4–10.5)
Chloride: 100 mEq/L (ref 96–112)
Creatinine, Ser: 1.11 mg/dL — ABNORMAL HIGH (ref 0.50–1.10)
GFR calc Af Amer: 55 mL/min — ABNORMAL LOW (ref >90)
GFR, EST NON AFRICAN AMERICAN: 47 mL/min — AB (ref >90)
GLUCOSE: 91 mg/dL (ref 70–99)
POTASSIUM: 4.1 meq/L (ref 3.7–5.3)
SODIUM: 140 meq/L (ref 137–147)

## 2013-11-09 LAB — POCT ACTIVATED CLOTTING TIME
ACTIVATED CLOTTING TIME: 215 s
Activated Clotting Time: 221 seconds

## 2013-11-09 LAB — CBC
HCT: 39.6 % (ref 36.0–46.0)
HEMOGLOBIN: 13.1 g/dL (ref 12.0–15.0)
MCH: 29.3 pg (ref 26.0–34.0)
MCHC: 33.1 g/dL (ref 30.0–36.0)
MCV: 88.6 fL (ref 78.0–100.0)
Platelets: 252 10*3/uL (ref 150–400)
RBC: 4.47 MIL/uL (ref 3.87–5.11)
RDW: 15 % (ref 11.5–15.5)
WBC: 8.3 10*3/uL (ref 4.0–10.5)

## 2013-11-09 MED ORDER — AMLODIPINE BESYLATE 10 MG PO TABS
10.0000 mg | ORAL_TABLET | Freq: Every day | ORAL | Status: DC
  Filled 2013-11-09: qty 1

## 2013-11-09 MED ORDER — AMLODIPINE BESYLATE 5 MG PO TABS
5.0000 mg | ORAL_TABLET | Freq: Once | ORAL | Status: DC
  Filled 2013-11-09: qty 1

## 2013-11-09 MED ORDER — ASPIRIN 325 MG PO TBEC: 325.0000 mg | DELAYED_RELEASE_TABLET | Freq: Every day | ORAL | Status: DC

## 2013-11-09 MED ORDER — CLOPIDOGREL BISULFATE 75 MG PO TABS: 75.0000 mg | ORAL_TABLET | Freq: Every day | ORAL | Status: DC

## 2013-11-09 MED ORDER — AMLODIPINE BESYLATE 10 MG PO TABS: 10.0000 mg | ORAL_TABLET | Freq: Every day | ORAL | Status: DC

## 2013-11-09 NOTE — Progress Notes (Signed)
Subjective: No complaints  Objective: Vital signs in last 24 hours: Temp:  [98.3 F (36.8 C)-99.2 F (37.3 C)] 98.3 F (36.8 C) (01/06 0520) Pulse Rate:  [36-72] 50 (01/06 0520) Resp:  [18-20] 20 (01/06 0520) BP: (120-195)/(39-135) 153/68 mmHg (01/06 0520) SpO2:  [94 %-100 %] 94 % (01/06 0520) Weight:  [244 lb 14.9 oz (111.1 kg)] 244 lb 14.9 oz (111.1 kg) (01/06 0044) Last BM Date: 11/08/12  Intake/Output from previous day: 01/05 0701 - 01/06 0700 In: 1305 [P.O.:600; I.V.:705] Out: 400 [Urine:400] Intake/Output this shift:    Medications Current Facility-Administered Medications  Medication Dose Route Frequency Provider Last Rate Last Dose  . acetaminophen (TYLENOL) tablet 650 mg  650 mg Oral Q4H PRN Lorretta Harp, MD      . amLODipine (NORVASC) tablet 5 mg  5 mg Oral Daily Lorretta Harp, MD   5 mg at 11/08/13 1113  . aspirin EC tablet 325 mg  325 mg Oral Daily Lorretta Harp, MD   325 mg at 11/08/13 1638  . cetirizine (ZYRTEC) tablet 10 mg  10 mg Oral Daily Lorretta Harp, MD   10 mg at 11/08/13 1639  . clopidogrel (PLAVIX) tablet 75 mg  75 mg Oral Q breakfast Lorretta Harp, MD      . etodolac (LODINE) tablet 400 mg  400 mg Oral BID Lorretta Harp, MD   400 mg at 11/08/13 2215  . fluticasone (FLONASE) 50 MCG/ACT nasal spray 2 spray  2 spray Each Nare Daily PRN Lorretta Harp, MD      . hydrALAZINE (APRESOLINE) injection 10 mg  10 mg Intravenous UD Lorretta Harp, MD   10 mg at 11/08/13 1136  . irbesartan (AVAPRO) tablet 300 mg  300 mg Oral Daily Lorretta Harp, MD   300 mg at 11/08/13 1114   And  . hydrochlorothiazide (HYDRODIURIL) tablet 25 mg  25 mg Oral Daily Lorretta Harp, MD   25 mg at 11/08/13 1115  . levothyroxine (SYNTHROID, LEVOTHROID) tablet 88 mcg  88 mcg Oral QAC breakfast Lorretta Harp, MD   88 mcg at 11/09/13 0539  . morphine 2 MG/ML injection 1 mg  1 mg Intravenous Q1H PRN Lorretta Harp, MD   1 mg at 11/08/13 1406  . nebivolol  (BYSTOLIC) tablet 20 mg  20 mg Oral Daily Lorretta Harp, MD   20 mg at 11/08/13 1116  . ondansetron (ZOFRAN) injection 4 mg  4 mg Intravenous Q6H PRN Lorretta Harp, MD   4 mg at 11/08/13 1345  . pantoprazole (PROTONIX) EC tablet 20 mg  20 mg Oral Daily Lorretta Harp, MD   20 mg at 11/08/13 1117    PE: General appearance: alert, cooperative and no distress Lungs: clear to auscultation bilaterally Heart: irregularly irregular rhythm Extremities: No LEE Pulses: 2+ and symmetric Skin: Right groin:  no hematoma, ecchymosis or tenderness Neurologic: Grossly normal  Lab Results:   Recent Labs  11/08/13 0643 11/09/13 0336  WBC  --  8.3  HGB 14.6 13.1  HCT 43.0 39.6  PLT  --  252   BMET  Recent Labs  11/08/13 0643 11/09/13 0336  NA 143 140  K 4.2 4.1  CL 103 100  CO2  --  25  GLUCOSE 89 91  BUN 28* 31*  CREATININE 1.30* 1.11*  CALCIUM  --  8.7    Assessment/Plan    Active Problems:   Renal artery atherosclerosis, unilateral  Lung nodule  Plan:  SP successful right renal artery PTA and stent using a balloon expandable stent with excellent angiographic result and 0% residual stenosis.  FU LEA dopplers in two weeks then scheduled to see Dr. Gwenlyn Found.  BP still elevated.  Will increase norvasc to 10mg .    LOS: 1 day    HAGER, BRYAN 11/09/2013 7:35 AM    Patient seen and examined. Agree with assessment and plan. Feels well s/p PTA and stent  to R renal artery. No chest pain.  F/U cardiology with me, PV with JB. Needs f/u of LUL nodule verified CT.   Troy Sine, MD, [facility] 11/09/2013 10:27 AM

## 2013-11-09 NOTE — Discharge Summary (Signed)
Physician Discharge Summary      Patient ID: Susan Pittman MRN: [id number] DOB/AGE: Jul 13, 1941 73 y.o.  Admit date: 11/08/2013 Discharge date: 11/09/2013  Admission Diagnoses: Renal artery atherosclerosis  Discharge Diagnoses:  Active Problems:   Renal artery atherosclerosis, unilateral   Lung nodule   Discharged Condition: stable  Hospital Course:  Susan Pittman has a long-standing history of hypertension for at least 30-40 years. She initially was treated with diuretic therapy. She also states that approximately 20 years ago she had undergone a cardiac catheterization and was not told of having significant coronary artery blockage. Additional problems have included significant arthritis and she has undergone knee arthroscopic surgery. She has a history of hypothyroidism, remote peptic ulcer disease in the 1980s, as well as GERD. She also does note palpitations typically in the evening. She was recently seen by Dr. Mable Paris and at that time her blood pressure had been fluctuating ranging from 101 to 751 systolically and 70 to 02H diastolically despite being on her medical regimen which consisted of amlodipine 5 mg, Bystolic 20 mg, and Diovan HCT 320/12.5 mg.Additional problems also include hypothyroidism, osteoporosis, mild hyperlipidemia for which she's been followed medically. She saw Dr. Ellouise Newer earlier this month for evaluation. He ordered a renal Doppler study that suggested a hemodynamically significant right renal artery stenosis and the patient was referred to me for evaluation of renal vascular hypertension.  Patient was set up for renal artery angiogram and underwent successful right renal artery PTA and stent using a balloon expandable stent with excellent angiographic result and 0% residual stenosis.  She'll be treated with aspirin and Plavix. She'll followup renal Dopplers and see Dr. Gwenlyn Found back in the office.  The patient was seen by Dr. Vilma Meckel who felt she was stable for  DC home.  The patient is aware that she needs to follow up regarding the lung nodule identified on the recent CT scan.   Consults: None  Significant Diagnostic Studies:  PV angiogram  HEMODYNAMICS:  AO SYSTOLIC/AO DIASTOLIC: 852/77  Angiographic Data: midstream abdominal aortography revealed an 80% ostial right renal artery stenosis. Left renal artery is widely patent. The aorta appeared free of significant atherosclerotic disease.  IMPRESSION:80% right renal artery stenosis consistent with the renal Doppler findings. We'll proceed with renal artery stenting for presumed renal vascular hypertension  Procedure Description:using a 6 French LIMA guide catheter along with an 014 stabilizer wire and a 4 mm x 2 cm predilatation balloon angioplasty was performed at the ostium of the right renal artery. Stenting was then performed with a 5.5 mm x 15 mm long Abbott Herculink balloon expandable stent at 12 atmospheres (5.4 mm) resulting in reduction of an 80% ostial right renal artery stenosis to 0% residual.  Final Impression: successful right renal artery PTA and stent using a balloon expandable stent with excellent angiographic result and 0% residual stenosis. The guidewire and catheter were were removed and the sheath was secured. The patient left the Cath Lab in stable condition. The sheath will be removed once the ACT falls below 170 and pressure will be held on the groin. She will be treated with aspirin Plavix. She'll be hydrated overnight and discharged home in the morning. She will obtain followup outpatient renal Doppler studies in our office and then see me back after that.  Lorretta Harp MD, [facility]  11/08/2013  Treatments: See Above  Discharge Exam: Blood pressure 166/58, pulse 62, temperature 98 F (36.7 C), temperature source Oral, resp. rate 18, height 5\' 5"  (  1.651 m), weight 244 lb 14.9 oz (111.1 kg), SpO2 96.00%.   Disposition:       Discharge Orders   Future Appointments  Provider Department Dept Phone   11/15/2013 9:00 AM [facility] [place] [facility] [address] [phone]   11/30/2013 7:30 AM Lorretta Harp, MD [facility] [phone]   03/16/2014 9:00 AM [name], MD [place] [facility]  [place]   Future Orders Complete By Expires   Diet - low sodium heart healthy  As directed    Discharge instructions  As directed    Comments:     No lifting more than a half gallon of milk or driving for three days.   Increase activity slowly  As directed        Medication List         amLODipine 10 MG tablet  Commonly known as:  NORVASC  Take 1 tablet (10 mg total) by mouth daily.     aspirin 325 MG EC tablet  Take 1 tablet (325 mg total) by mouth daily.     Calcium 600-200 MG-UNIT per tablet  Take 1 tablet by mouth 2 (two) times daily. Takes at [facility] and Evening     clopidogrel 75 MG tablet  Commonly known as:  PLAVIX  Take 1 tablet (75 mg total) by mouth daily with breakfast.     etodolac 400 MG tablet  Commonly known as:  LODINE  Take 400 mg by mouth 2 (two) times daily.     fluticasone 50 MCG/ACT nasal spray  Commonly known as:  FLONASE  Place 2 sprays into both nostrils daily as needed for allergies or rhinitis.     lansoprazole 15 MG capsule  Commonly known as:  PREVACID  Take 15 mg by mouth daily.     levothyroxine 88 MCG tablet  Commonly known as:  SYNTHROID, LEVOTHROID  Take 88 mcg by mouth daily before breakfast.     MULTI FOR HER 50+ PO  Take 1 tablet by mouth daily.     Nebivolol HCl 20 MG Tabs  Take 1 tablet by mouth daily.     valsartan-hydrochlorothiazide 320-25 MG per tablet  Commonly known as:  DIOVAN-HCT  Take 1 tablet by mouth daily.     ZYRTEC 10 MG tablet  Generic drug:  cetirizine  Take 10 mg by mouth at bedtime.       Follow-up Information   Follow up with Lorretta Harp, MD On 11/30/2013. (7:30 AM)    Specialty:  Cardiology    Contact information:   [address] [place] [address] [phone]       Follow up with Renal Ultrasound On 11/15/2013. (9:00AM)    Contact information:   [address] [place] [address] [phone]      Signed: Tarri Fuller 11/09/2013, 10:43 AM

## 2013-11-10 NOTE — Telephone Encounter (Signed)
Dr Gwenlyn Found was made aware and he has spoken with the patient.

## 2013-11-12 ENCOUNTER — Telehealth: Payer: Self-pay

## 2013-11-12 ENCOUNTER — Telehealth: Payer: Self-pay | Admitting: Cardiovascular Disease

## 2013-11-12 DIAGNOSIS — R911 Solitary pulmonary nodule: Secondary | ICD-10-CM

## 2013-11-12 NOTE — Telephone Encounter (Signed)
Please call-concerning  A pulmonary test she is suppose to have.She does not know how to get in touch with the pulmonary doctor.

## 2013-11-12 NOTE — Telephone Encounter (Signed)
Returned call and pt verified x 2.  Pt stated when she had her procedure done this week, Dr. Gwenlyn Found said he would do a consult w/ the pulmonary doctor b/c of the result of her CT scan.  Stated she talked to Dr. Earnest Conroy and a another lady, but did not get any information as far as an appointment or anything.  Stated she looked up on her MyChart to see if she had an appt and didn't.    RN reviewed chart and pt did consult w/ Dr. Earnest Conroy and NP Alfredo Martinez w/ [facility].  Pt given phone number [phone].  Pt advised to contact the office for advice on f/u and if unsuccessful to call back to our office and RN will try to assist further.  Pt verbalized understanding and agreed w/ plan.

## 2013-11-12 NOTE — Telephone Encounter (Signed)
Called and spoke with Noe Gens, NP--she wanted the pt to have PET scan done and consult with pulmonary.  Spoke with pt and appt scheduled with MW on 1/21 and pet scan has been ordered for the pt to have prior to ov.  Pt is aware of appt date and time and location of our office.

## 2013-11-15 ENCOUNTER — Other Ambulatory Visit (HOSPITAL_COMMUNITY): Payer: Self-pay | Admitting: Cardiovascular Disease

## 2013-11-15 ENCOUNTER — Ambulatory Visit (HOSPITAL_COMMUNITY)
Admit: 2013-11-15 | Discharge: 2013-11-15 | Disposition: A | Discharge status: Home / Self Care | Payer: Medicare Other | Attending: Cardiology | Admitting: Cardiology

## 2013-11-15 DIAGNOSIS — I701 Atherosclerosis of renal artery: Secondary | ICD-10-CM

## 2013-11-15 DIAGNOSIS — I10 Essential (primary) hypertension: Secondary | ICD-10-CM | POA: Diagnosis not present

## 2013-11-15 DIAGNOSIS — Z09 Encounter for follow-up examination after completed treatment for conditions other than malignant neoplasm: Secondary | ICD-10-CM | POA: Insufficient documentation

## 2013-11-15 DIAGNOSIS — Z9889 Other specified postprocedural states: Secondary | ICD-10-CM | POA: Diagnosis not present

## 2013-11-15 NOTE — Progress Notes (Signed)
Renal Duplex Limited completed for post stent placement. Oda Cogan, BS, RDMS, RVT

## 2013-11-16 DIAGNOSIS — R0609 Other forms of dyspnea: Secondary | ICD-10-CM | POA: Diagnosis not present

## 2013-11-18 ENCOUNTER — Ambulatory Visit: Payer: Medicare Other | Admitting: Cardiology

## 2013-11-22 ENCOUNTER — Telehealth: Payer: Self-pay | Admitting: Cardiovascular Disease

## 2013-11-22 NOTE — Telephone Encounter (Signed)
Had a sleep study last week  Wants to get results of the sleep study.  Please call

## 2013-11-22 NOTE — Telephone Encounter (Signed)
Returned call.  Left message w/o pt-identifying info that results have not been reviewed and will be contacted once they have been.  Also to call back before 4pm if questions.

## 2013-11-23 ENCOUNTER — Encounter: Payer: Self-pay | Admitting: Internal Medicine

## 2013-11-23 ENCOUNTER — Encounter (HOSPITAL_COMMUNITY)
Admission: RE | Admit: 2013-11-23 | Discharge: 2013-11-23 | Disposition: A | Discharge status: Home / Self Care | Payer: Medicare Other | Source: Ambulatory Visit | Attending: Internal Medicine | Admitting: Internal Medicine

## 2013-11-23 ENCOUNTER — Encounter (HOSPITAL_COMMUNITY): Payer: Self-pay

## 2013-11-23 DIAGNOSIS — Z9089 Acquired absence of other organs: Secondary | ICD-10-CM | POA: Insufficient documentation

## 2013-11-23 DIAGNOSIS — R911 Solitary pulmonary nodule: Secondary | ICD-10-CM | POA: Diagnosis not present

## 2013-11-23 DIAGNOSIS — K5730 Diverticulosis of large intestine without perforation or abscess without bleeding: Secondary | ICD-10-CM | POA: Insufficient documentation

## 2013-11-23 LAB — GLUCOSE, CAPILLARY: Glucose-Capillary: 99 mg/dL (ref 70–99)

## 2013-11-23 MED ORDER — FLUDEOXYGLUCOSE F - 18 (FDG) INJECTION
18.9000 | Freq: Once | INTRAVENOUS | Status: AC | PRN
  Administered 2013-11-23: 18.9 via INTRAVENOUS

## 2013-11-24 ENCOUNTER — Ambulatory Visit (INDEPENDENT_AMBULATORY_CARE_PROVIDER_SITE_OTHER): Payer: Medicare Other | Admitting: Internal Medicine

## 2013-11-24 ENCOUNTER — Encounter: Payer: Self-pay | Admitting: Internal Medicine

## 2013-11-24 VITALS — BP 140/84 | HR 80 | Temp 97.6°F | Ht 65.0 in | Wt 252.0 lb

## 2013-11-24 DIAGNOSIS — R911 Solitary pulmonary nodule: Secondary | ICD-10-CM | POA: Diagnosis not present

## 2013-11-24 NOTE — Progress Notes (Signed)
   Subjective:    Patient ID: Susan Pittman, female    DOB: 04-06-1941 MRN: [id number]  HPI  40 yowf never smoker from [place] exposed to lots of cigar/pipe smoke referred 11/24/2013 by Dr Gwenlyn Found for pulmonary eval for spn.  11/24/2013 1st [facility] office visit/ Wert cc abn cxr vs 2009 baseline - no symptoms at all, cxr was done for preop purposes.  Does have h/o rhinitis and asthma dx but denies needing any inhalers  No  assoc chronic cough or sob/ cp or chest tightness, subjective wheeze overt sinus or hb symptoms. No unusual exp hx or h/o childhood pna/ asthma or knowledge of premature birth.  Sleeping ok without nocturnal  or early am exacerbation  of respiratory  c/o's or need for noct saba. Also denies any obvious fluctuation of symptoms with weather or environmental changes or other aggravating or alleviating factors except as outlined above   Current Medications, Allergies, Complete Past Medical History, Past Surgical History, Family History, and Social History were reviewed in [employer] record.       Review of Systems  Constitutional: Negative for fever, chills and unexpected weight change.  HENT: Negative for congestion, dental problem, ear pain, nosebleeds, postnasal drip, rhinorrhea, sinus pressure, sneezing, sore throat, trouble swallowing and voice change.   Eyes: Negative for visual disturbance.  Respiratory: Negative for cough, choking and shortness of breath.   Cardiovascular: Negative for chest pain and leg swelling.  Gastrointestinal: Negative for vomiting, abdominal pain and diarrhea.  Genitourinary: Negative for difficulty urinating.  Musculoskeletal: Negative for arthralgias.  Skin: Negative for rash.  Neurological: Negative for tremors, syncope and headaches.  Hematological: Does not bruise/bleed easily.       Objective:   Physical Exam   Pleasant amb wf nad  Wt Readings from Last 3 Encounters:  11/24/13 252 lb (114.306  kg)  11/09/13 244 lb 14.9 oz (111.1 kg)  11/09/13 244 lb 14.9 oz (111.1 kg)      HEENT: nl dentition, turbinates, and orophanx. Nl external ear canals without cough reflex   NECK :  without JVD/Nodes/TM/ nl carotid upstrokes bilaterally   LUNGS: no acc muscle use, clear to A and P bilaterally without cough on insp or exp maneuvers   CV:  RRR  no s3 or murmur or increase in P2, no edema   ABD:  soft and nontender with nl excursion in the supine position. No bruits or organomegaly, bowel sounds nl  MS:  warm without deformities, calf tenderness, cyanosis or clubbing  SKIN: warm and dry without lesions    NEURO:  alert, approp, no deficits           Assessment & Plan:

## 2013-11-24 NOTE — Patient Instructions (Signed)
Please see patient coordinator before you leave today  to schedule T surgery eval

## 2013-11-27 NOTE — Assessment & Plan Note (Signed)
PET 11/23/2013 1. Dominant sub solid left lower lobe nodule does not demonstrate significantly increased metabolic activity. However, morphologically, adenocarcinoma is still a concern. Thoracic surgery consultation is recommended - Spirometry 11/24/13 wnl   Discussed in detail all the  indications, usual  risks and alternatives  relative to the benefits with patient who agrees to proceed with  T surg eval   This lesion was not present on 2009 film and literally looks like a crab, not a spherical lesion, on CT chest with nothing to suggest it's a residual from an infection so we cannot rule out ca here, nor would fna do this to 100% level confidence- she's a good surgical candidate and though has a reduced risk of ca based on hx of only passive smoke and a non-specific PET it would be relatively easy by excisional bx to put this issue 100% to rest and she elected to proceed with at least a T surg eval.

## 2013-11-29 ENCOUNTER — Telehealth: Payer: Self-pay | Admitting: Cardiovascular Disease

## 2013-11-29 ENCOUNTER — Encounter: Payer: Medicare Other | Admitting: Cardiothoracic Surgery

## 2013-11-29 NOTE — Telephone Encounter (Signed)
Took her blood pressure this morning it was 135/85 and it came up that she has an irregular heart beat. Took it  3 times and  It came up each time. She said this never happen  brfore,very concerned.

## 2013-11-29 NOTE — Telephone Encounter (Signed)
Returned call and pt verified x 2.  Pt stated this AM when she took her BP it showed "heart" (symbol) and when she looked it up it said irregular heart beat.  Stated she checked it 3 times and it was the same.  Stated pulse rate was 56.  Denied CP or SOB.  Stated she decided to check BP this morning b/c she weighed 3-4 lbs more today.  Denied swelling.  Pt stated she has an appt tomorrow w/ Dr. Gwenlyn Found at 7:30am and wanted to let us know b/c the instructions w/ the monitor state to call the doctor's office.  Pt informed Dr. Kennon Holter nurse will be notified to address w/ Dr. Gwenlyn Found in case EKG is needed tomorrow.  Pt also informed she should call back if any changes in symptoms as she is asymptomatic now and HR is low.  Pt verbalized understanding and agreed w/ plan.  Message forwarded to K. Donne Hazel, RN to discuss w/ Dr. Gwenlyn Found.

## 2013-11-30 ENCOUNTER — Ambulatory Visit (INDEPENDENT_AMBULATORY_CARE_PROVIDER_SITE_OTHER): Payer: Medicare Other | Admitting: Cardiovascular Disease

## 2013-11-30 ENCOUNTER — Institutional Professional Consult (permissible substitution) (INDEPENDENT_AMBULATORY_CARE_PROVIDER_SITE_OTHER): Payer: Medicare Other | Admitting: Cardiothoracic Surgery

## 2013-11-30 ENCOUNTER — Encounter: Payer: Self-pay | Admitting: *Deleted

## 2013-11-30 ENCOUNTER — Encounter: Payer: Self-pay | Admitting: Cardiovascular Disease

## 2013-11-30 ENCOUNTER — Other Ambulatory Visit: Payer: Self-pay | Admitting: *Deleted

## 2013-11-30 ENCOUNTER — Encounter: Payer: Self-pay | Admitting: Cardiothoracic Surgery

## 2013-11-30 VITALS — BP 170/88 | HR 55 | Resp 16 | Ht 65.0 in | Wt 246.0 lb

## 2013-11-30 VITALS — BP 168/88 | HR 55 | Ht 65.0 in | Wt 247.0 lb

## 2013-11-30 DIAGNOSIS — I701 Atherosclerosis of renal artery: Secondary | ICD-10-CM

## 2013-11-30 DIAGNOSIS — I499 Cardiac arrhythmia, unspecified: Secondary | ICD-10-CM

## 2013-11-30 DIAGNOSIS — D381 Neoplasm of uncertain behavior of trachea, bronchus and lung: Secondary | ICD-10-CM | POA: Diagnosis not present

## 2013-11-30 DIAGNOSIS — R911 Solitary pulmonary nodule: Secondary | ICD-10-CM

## 2013-11-30 NOTE — Patient Instructions (Signed)
  Your physician wants you to follow-up with him in : 3 months with Dr Claiborne Billings                                            and with Erasmo Downer in one month for a blood pressure check.                    You will receive a reminder letter in the mail one month in advance. If you don't receive a letter, please call our office to schedule the follow-up appointment.    Your physician has ordered the following tests: renal dopplers in 6 months

## 2013-11-30 NOTE — Progress Notes (Signed)
Patient ID: Susan Pittman, female   DOB: Mar 21, 1941, 73 y.o.   MRN: [id number]  Chief Complaint  Patient presents with  . Follow-up    post hospital     HPI Susan Pittman is a 73 y.o. female with h/o hypothyroid, peptic ulcer disease, asthma, hypertension who recently underwent on 11/08/13 right renal artery PTA and stenting for right renal artery stenosis.  Initial RAR on renal duplex prior to procedure was 4.49. Repeat doppler showed RAR of 1.21 with stent open and patent without evidence of restenosis.  Since the procedure, she has noticed a marked difference in her BP readings at home. She reports BP's ranging in the 130's-140's/75-85 on a regular basis. This is a change from the 440 systolic range she used to run prior to the procedure. She otherwise has not noticed any symptomatic change. She continues to take norvasc 10, hctz/valsartan 25/320 and nebivolol 20 and is compliant with medications.  She denies any chest pain, palpitations, shortness of breath or lower extremity swelling.  She did notice on her BP monitor report of an irregular rhythm yesterday but has not had any associated symptoms.   Past Medical History  Diagnosis Date  . Asthma   . Hypertension   . GERD (gastroesophageal reflux disease)   . Allergy   . Hypothyroid   . Osteoporosis   . Gastric ulcer   . Hiatal hernia   . Hyperlipidemia     "don't take RX for it" (11/08/2013)  . Renal artery stenosis   . Lung mass     superior segment LLL/notes 11/08/2013  . Basal cell carcinoma     "cut them off face & right shoulder" (11/08/2013)  . Pneumonia     "I've had it ?3 times" (11/08/2013)  . Arthritis     "knees, hips, back, hands" (11/08/2013)  . DJD (degenerative joint disease)     Past Surgical History  Procedure Laterality Date  . Knee arthroscopy Bilateral   . Anterior cervical decomp/discectomy fusion  2000    C5-C6  . Renal artery stent Right 11/08/2013    Archie Endo 11/08/2013  . Tonsillectomy and  adenoidectomy  ?1946  . Cholecystectomy  1970's  . Vaginal hysterectomy  1970  . Dilation and curettage of uterus  1960's  . Skin cancer excision      Family History  Problem Relation Age of Onset  . Breast cancer Sister   . Emphysema Father     smoked  . Allergies Sister   . Allergies Brother   . Heart disease Mother   . Heart disease Father     Social History History  Substance Use Topics  . Smoking status: Never Smoker   . Smokeless tobacco: Never Used  . Alcohol Use: No    Allergies  Allergen Reactions  . Benazepril Hcl Anaphylaxis and Cough  . Celecoxib     REACTION: aching  . Lisinopril     REACTION: cough  . Loratadine     anaphylaxis  . Allegra [Fexofenadine Hcl]     Severe back pain  . Sulfa Antibiotics Hives    Current Outpatient Prescriptions  Medication Sig Dispense Refill  . amLODipine (NORVASC) 10 MG tablet Take 1 tablet (10 mg total) by mouth daily.  30 tablet  5  . aspirin EC 325 MG EC tablet Take 1 tablet (325 mg total) by mouth daily.  30 tablet  0  . Calcium 600-200 MG-UNIT per tablet Take 1 tablet by mouth 2 (two) times daily.  Takes at [employer]      . cetirizine (ZYRTEC) 10 MG tablet Take 10 mg by mouth at bedtime.       . clopidogrel (PLAVIX) 75 MG tablet Take 1 tablet (75 mg total) by mouth daily with breakfast.  30 tablet  5  . etodolac (LODINE) 400 MG tablet Take 400 mg by mouth 2 (two) times daily.      . fluticasone (FLONASE) 50 MCG/ACT nasal spray Place 2 sprays into both nostrils daily as needed for allergies or rhinitis.      Marland Kitchen lansoprazole (PREVACID) 15 MG capsule Take 15 mg by mouth daily.        Marland Kitchen levothyroxine (SYNTHROID, LEVOTHROID) 88 MCG tablet Take 88 mcg by mouth daily before breakfast.      . Multiple Vitamins-Minerals (MULTI FOR HER 50+ PO) Take 1 tablet by mouth daily.       . Nebivolol HCl 20 MG TABS Take 1 tablet by mouth daily.      . valsartan-hydrochlorothiazide (DIOVAN-HCT) 320-25 MG per tablet Take 1 tablet  by mouth daily.       No current facility-administered medications for this visit.    Review of Systems Review of Systems Negative except per HPI Blood pressure 168/88, pulse 55, height 5\' 5"  (1.651 m), weight 247 lb (112.038 kg).  Physical Exam Physical Exam General: well appearing, in no acute distress, pleasant white female HEENT: EOMI, no JVD CV: S1S2, bigeminy rhythm, normal rate, no murmur appreciated Resp: normal work of breathing, clear to auscultation bilaterally, no wheezing or crackles Abdomen: non tender, no guarding or rebound, present bowel sounds, surgical site on right groin well healed Ext: no pitting edema  Data Reviewed Renal Duplex from 11/15/13  BMET    Component Value Date/Time   NA 140 11/09/2013 0336   K 4.1 11/09/2013 0336   CL 100 11/09/2013 0336   CO2 25 11/09/2013 0336   GLUCOSE 91 11/09/2013 0336   BUN 31* 11/09/2013 0336   CREATININE 1.11* 11/09/2013 0336   CREATININE 1.06 11/02/2013 1123   CALCIUM 8.7 11/09/2013 0336   GFRNONAA 47* 11/09/2013 0336   GFRAA 55* 11/09/2013 0336    EKG: atrial bigeminy with rate of 1  Assessment    73 yo female with h/o hypothyroid, peptic ulcer disease, asthma, hypertension who recently underwent on 11/08/13 right renal artery PTA and stenting for right renal artery stenosis.       Plan    1. Right renal artery stenosis: Blood pressures at home appear improved compared to prior to the procedure. Stent open and patent without stenosis on repeat duplex post procedure.   - continue current BP medications: norvasc 10, hctz/valsartan 25/320, nebivolol 20 - follow up with pharmacist in 1 month with home BP log to adjust medications - continue asa and plavix - follow up with Dr. Claiborne Billings in 3 months - repeat renal artery duplex in 6 months  2. Atrial bigeminy: benign. Patient reassured.        Liam Graham 11/30/2013, 7:59 AM Family Medicine Resident PGY-3  I agree with assessment and plan by Dr. Otis Dials . The patient is  status post right renal artery stent which I performed recently for renal vascular hypertension. Renal Dopplers done postprocedure have normalized. Her blood pressures at home have been much better. She does have atrial bigeminy on EKG. She will follow up Liane Comber, M.D., in one month to review her blood pressure log in her medications. She may be able to have these  down titrated. She will see Dr. Ellouise Newer back in 3 months for followup.   Lorretta Harp, M.D., [place], [facility], [place] [address] [place], [place]  [address]  [phone] 11/30/2013 9:41 AM

## 2013-11-30 NOTE — Telephone Encounter (Signed)
Patient was seen and an EKG done

## 2013-12-01 NOTE — Progress Notes (Signed)
[address]       [place] [address]             [phone]                    Susan Pittman [place] Medical Record #[id number] Date of Birth: September 01, 1941  Referring: Tanda Rockers, MD Primary Care: [name], MD  Chief Complaint:    Chief Complaint  Patient presents with  . Lung Lesion    CT CHEST 11/05/13.Marland KitchenMarland KitchenPET 11/23/13...OFFICE SPIROMETRY ONLY    History of Present Illness:    Susan Pittman 73 y.o. female is seen in the office  today for incidental finding of a 2.2 cm nodule in the left lower lobe. She is a lifelong nonsmoker. She has had asthma symptoms in the past usually related to "colds". Over the past several years she has been treated for "pneumonia". She's had no definite occupational exposure to asbestos or other hazardous substances. She denies any history or family history of tuberculosis She does note visiting family in the [place] area of [place] for 2 months and 4 months periods. During that time she noted sinus drainage and a "deep cough". She noted being exposed to several [place] dust storms during her visits.  The patient was referred to cardiology because of increasing difficult to control hypertension a renal artery stenosis was diagnosed. Chest x-ray done preprocedure noted a 2 cm left lower lobe lung mass, this led to further evaluation with CT scan and PET scan. The patient is seen in the office today for further evaluation and treatment of the left lung mass of unknown etiology but possibly low grade malignancy.   Current Activity/ Functional Status:  Patient is independent with mobility/ambulation, transfers, ADL's, IADL's.  Zubrod Score: At the time of surgery this patient's most appropriate activity status/level should be described as: [x]  Normal activity, no symptoms []  Symptoms, fully ambulatory []  Symptoms, in bed less than or equal to 50% of the time []  Symptoms, in bed greater than 50% of the time but less  than 100% []  Bedridden []  Moribund   Past Medical History  Diagnosis Date  . Asthma   . Hypertension   . GERD (gastroesophageal reflux disease)   . Allergy   . Hypothyroid   . Osteoporosis   . Gastric ulcer   . Hiatal hernia   . Hyperlipidemia     "don't take RX for it" (11/08/2013)  . Renal artery stenosis   . Lung mass     superior segment LLL/notes 11/08/2013  . Basal cell carcinoma     "cut them off face & right shoulder" (11/08/2013)  . Pneumonia     "I've had it ?3 times" (11/08/2013)  . Arthritis     "knees, hips, back, hands" (11/08/2013)  . DJD (degenerative joint disease)     Past Surgical History  Procedure Laterality Date  . Knee arthroscopy Bilateral   . Anterior cervical decomp/discectomy fusion  2000    C5-C6  . Renal artery stent Right 11/08/2013    Archie Endo 11/08/2013  . Tonsillectomy and adenoidectomy  ?1946  . Cholecystectomy  1970's  . Vaginal hysterectomy  1970  . Dilation and curettage of uterus  1960's  . Skin cancer excision      Family History  Problem Relation Age of Onset  . Breast cancer Sister   . Emphysema Father     smoked  . Allergies Sister   . Allergies  Brother   . Heart disease Mother   . Heart disease Father     History   Social History  . Marital Status: Married    Spouse Name: N/A    Number of Children: N/A  . Years of Education: N/A   Occupational History  . Retired    Social History Main Topics  . Smoking status: Never Smoker   . Smokeless tobacco: Never Used  . Alcohol Use: No  . Drug Use: No  . Sexual Activity: Yes     History  Smoking status  . Never Smoker   Smokeless tobacco  . Never Used    History  Alcohol Use No     Allergies  Allergen Reactions  . Benazepril Hcl Anaphylaxis and Cough  . Celecoxib     REACTION: aching  . Lisinopril     REACTION: cough  . Loratadine     anaphylaxis  . Allegra [Fexofenadine Hcl]     Severe back pain  . Sulfa Antibiotics Hives    Current Outpatient  Prescriptions  Medication Sig Dispense Refill  . amLODipine (NORVASC) 10 MG tablet Take 1 tablet (10 mg total) by mouth daily.  30 tablet  5  . aspirin EC 325 MG EC tablet Take 1 tablet (325 mg total) by mouth daily.  30 tablet  0  . Calcium 600-200 MG-UNIT per tablet Take 1 tablet by mouth 2 (two) times daily. Takes at [employer]      . cetirizine (ZYRTEC) 10 MG tablet Take 10 mg by mouth at bedtime.       . clopidogrel (PLAVIX) 75 MG tablet Take 1 tablet (75 mg total) by mouth daily with breakfast.  30 tablet  5  . etodolac (LODINE) 400 MG tablet Take 400 mg by mouth 2 (two) times daily.      . fluticasone (FLONASE) 50 MCG/ACT nasal spray Place 2 sprays into both nostrils daily as needed for allergies or rhinitis.      Marland Kitchen lansoprazole (PREVACID) 15 MG capsule Take 15 mg by mouth daily.        Marland Kitchen levothyroxine (SYNTHROID, LEVOTHROID) 88 MCG tablet Take 88 mcg by mouth daily before breakfast.      . Multiple Vitamins-Minerals (MULTI FOR HER 50+ PO) Take 1 tablet by mouth daily.       . Nebivolol HCl 20 MG TABS Take 1 tablet by mouth daily.      . valsartan-hydrochlorothiazide (DIOVAN-HCT) 320-25 MG per tablet Take 1 tablet by mouth daily.       No current facility-administered medications for this visit.     REVIEW OF SYSTEMS:   Review of Systems:     Cardiac Review of Systems: Y or N  Chest Pain [  n  ]  Resting SOB [ n  ] Exertional SOB  [ n ]  Orthopnea [ n ]   Pedal Edema [n   ]    Palpitations [n  ] Syncope  [ n ]   Presyncope [ n  ]  General Review of Systems: [Y] = yes [  ]=no Constitional: recent weight change [ n ];  Wt loss over the last 3 months [ none  ] anorexia [  n]; fatigue [ n ]; nausea [n]; night sweats [ n ]; fever [  n]; or chills [n];          Dental: poor dentition[ n ]; Last Dentist visit:   Eye : blurred vision [  ]; diplopia [   ];  vision changes Blue.Reese  ];  Amaurosis fugax[  ]; Resp: cough Blue.Reese  ];  wheezing[ y ];  hemoptysis[n  ]; shortness of breath[  n ];  paroxysmal nocturnal dyspnea[ n ]; dyspnea on exertion[ n ]; or orthopnea[n  ];  GI:  gallstones[removed  ], vomiting[n  ];  dysphagia[  ]; melena[  n];  hematochezia [  ]; heartburn[  ];   Hx of  Colonoscopy[ y due 66 ]; GU: kidney stones [  ]; hematuria[ n ];   dysuria [  ];  nocturia[  ];  history of     obstruction [  ]; urinary frequency [ n ]             Skin: rash, swelling[  ];, hair loss[  ];  peripheral edema[  ];  or itching[  ]; Musculosketetal: myalgias[  ];  joint swelling[ y ];  joint erythema[  ];  joint pain[y  ];  back pain[  ];  Heme/Lymph: bruising[  ];  bleeding[  ];  anemia[  ];  Neuro: TIA[  ];  headaches[  ];  stroke[  ];  vertigo[  ];  seizures[  ];   paresthesias[ n ];  difficulty walking[n  ];  Psych:depression[  ]; anxiety[  ];  Endocrine: diabetes[  ];  thyroid dysfunction[  ];  Immunizations: Flu up to date [ y ]; Pneumococcal up to date Blue.Reese  ];  Other:  Physical Exam: BP 170/88  Pulse 55  Resp 16  Ht 5\' 5"  (1.651 m)  Wt 246 lb (111.585 kg)  BMI 40.94 kg/m2  SpO2 98%  PHYSICAL EXAMINATION:  General appearance: alert, cooperative, appears stated age and no distress Neurologic: intact Heart: regular rate and rhythm, S1, S2 normal, no murmur, click, rub or gallop Lungs: clear to auscultation bilaterally Abdomen: soft, non-tender; bowel sounds normal; no masses,  no organomegaly Extremities: evidence of previous bilaterial knee surgery Patient has no carotid bruits, no palpable cervical or supraclavicular or axillary adenopathy   Diagnostic Studies & Laboratory data:     Recent Radiology Findings:   Dg Chest 2 View  11/02/2013   CLINICAL DATA:  Preop for renal surgery.  Hypertension.  EXAM: CHEST  2 VIEW  COMPARISON:  01/28/2008  FINDINGS: Probable cholecystectomy. Lower cervical spine fixation. Midline trachea. Borderline cardiomegaly. Aortic atherosclerosis. Mediastinal contours otherwise within normal limits. No pleural effusion or pneumothorax.  Subtle nodular density projecting over the left upper lobe on the frontal radiograph, measuring 2.2 cm.  IMPRESSION: Suspicion of a 2.2 cm left upper lobe pulmonary nodule. Recommend further characterization with chest CT, preferably with contrast.  These results will be called to the ordering clinician or representative by the Radiologist Assistant, and communication documented in the PACS Dashboard.   Electronically Signed   By: Abigail Miyamoto M.D.   On: 11/02/2013 13:29   Ct Chest W Contrast  11/05/2013   CLINICAL DATA:  Pulmonary nodule. Abnormal chest x-ray on 11/02/2013  EXAM: CT CHEST WITH CONTRAST  TECHNIQUE: Multidetector CT imaging of the chest was performed during intravenous contrast administration.  CONTRAST:  4mL OMNIPAQUE IOHEXOL 300 MG/ML  SOLN  COMPARISON:  Chest x-rays dated 11/02/2013, 01/28/2008, and 01/23/2007  FINDINGS: There is a 21 x 18 x 14 mm ill-defined inhomogeneous mass in the superior segment of the left lower lobe worrisome for non-small cell lung cancer.  There is no hilar or mediastinal adenopathy. The lungs are otherwise clear. Heart size and pulmonary vascularity are normal. No effusions. No  osseous abnormality.  There is slight hepatic steatosis.  IMPRESSION: 21 mm ill-defined mass in the superior segment of the left lower lobe. The appearance is typical for non-small-cell lung cancer.   Electronically Signed   By: Rozetta Nunnery M.D.   On: 11/05/2013 10:20   Nm Pet Image Initial (pi) Skull Base To Thigh  11/23/2013   CLINICAL DATA:  Initial treatment strategy for left lower lobe pulmonary nodule.  EXAM: NUCLEAR MEDICINE PET SKULL BASE TO THIGH  FASTING BLOOD GLUCOSE:  Value: 99mg /dl  TECHNIQUE: 18.9 mCi F-18 FDG was injected intravenously. CT data was obtained and used for attenuation correction and anatomic localization only. (This was not acquired as a diagnostic CT examination.) Additional exam technical data entered on technologist worksheet.  COMPARISON:  CT CHEST W/CM  dated 11/05/2013; DG CHEST 2 VIEW dated 11/02/2013  FINDINGS: NECK  No hypermetabolic cervical lymph nodes are identified.There are no lesions of the pharyngeal mucosal space.  CHEST  The sub solid nodule in the superior segment of the left lower lobe demonstrates metabolic activity which is only slightly greater than background. SUV max is 1.8. This nodule measures 2.1 x 1.6 cm on image number 80. No hypermetabolic pulmonary nodules are demonstrated. There is no hypermetabolic mediastinal or hilar nodal activity.  ABDOMEN/PELVIS  There is no hypermetabolic activity within the liver, adrenal glands, spleen or pancreas. There is no hypermetabolic nodal activity. There is nonspecific low level activity associate with the stomach and sigmoid colon, the latter demonstrating mild diverticulosis. Patient's by is status post cholecystectomy and partial hysterectomy.  SKELETON  There is no hypermetabolic activity to suggest osseous metastatic disease.  IMPRESSION: 1. Dominant sub solid left lower lobe nodule does not demonstrate significantly increased metabolic activity. However, morphologically, adenocarcinoma is still a concern. Thoracic surgery consultation is recommended. These recommendations are taken from "Recommendations for the Management of Subsolid Pulmonary Nodules Detected at CT: A Statement from the [facility]" Radiology 2013; 266:1, 304-317. 2. No evidence of metastatic disease.   Electronically Signed   By: Camie Patience M.D.   On: 11/23/2013 10:30      Recent Lab Findings: Lab Results  Component Value Date   WBC 8.3 11/09/2013   HGB 13.1 11/09/2013   HCT 39.6 11/09/2013   PLT 252 11/09/2013   GLUCOSE 91 11/09/2013   CHOL 180 10/13/2013   TRIG 105 10/13/2013   HDL 52 10/13/2013   LDLDIRECT 126.3 09/10/2012   LDLCALC 107* 10/13/2013   ALT 14 10/13/2013   AST 15 10/13/2013   NA 140 11/09/2013   K 4.1 11/09/2013   CL 100 11/09/2013   CREATININE 1.11* 11/09/2013   BUN 31* 11/09/2013   CO2 25 11/09/2013    TSH 3.158 10/13/2013   INR 1.02 11/02/2013      Assessment / Plan:   1.)  2.2 cm semisolid left lower lobe lung nodule and lifelong nonsmoker, with possible exposure to Coccidioides. The lesion also could easily be bronchial alveolar carcinoma. The possibility of diagnosis of lung cancer is reviewed with the patient. Primary video assisted thoracoscopy and resection versus CT-guided needle biopsy first and depending on results proceeding with surgical resection.  The patient is now with a "fresh" renal artery stent on Plavix. Before proceeding with either needle biopsy or primary surgery her Plavix will need to be stopped. We'll contact Dr. Gwenlyn Found concerning this.  The patient will obtain A. Pulmonary function studies, I will contact Dr. Gwenlyn Found about timing of discontinuing the Plavix. The patient will return  in one week make a final decision about treatment and timing.  2.) renal vascular hypertension status post stent stable renal function    I spent 40 minutes counseling the patient face to face. The total time spent in the appointment was 60 minutes. Portions of this note have been generated with the assistance of voice recognition software. I apologize for any typographical/grammatical errors that may be present in this note as it has not been reviewed by a professional transcriptionist.   Grace Isaac MD      [address] [place] [address] Office [phone]   Beeper [phone]  12/01/2013 12:35 PM

## 2013-12-03 ENCOUNTER — Other Ambulatory Visit: Payer: Self-pay | Admitting: *Deleted

## 2013-12-03 ENCOUNTER — Telehealth: Payer: Self-pay | Admitting: *Deleted

## 2013-12-03 MED ORDER — NEBIVOLOL HCL 20 MG PO TABS: 20.0000 mg | ORAL_TABLET | Freq: Every day | ORAL | Status: DC

## 2013-12-03 NOTE — Telephone Encounter (Signed)
Patient called and stated that her pharmacy have sent over a request for bystolic 20 mg and have yet to hear anything. Patient wanted to see if we can give her samples of it until its refilled.  Pharmacy [place], [place] - [address]

## 2013-12-03 NOTE — Telephone Encounter (Signed)
Samples placed up front for patient and med refilled. Pt aware. JG//CMA

## 2013-12-07 ENCOUNTER — Other Ambulatory Visit: Payer: Self-pay | Admitting: General Practice

## 2013-12-07 MED ORDER — NEBIVOLOL HCL 10 MG PO TABS: 20.0000 mg | ORAL_TABLET | Freq: Every day | ORAL | Status: DC

## 2013-12-09 ENCOUNTER — Other Ambulatory Visit: Payer: Self-pay | Admitting: *Deleted

## 2013-12-09 ENCOUNTER — Encounter: Payer: Self-pay | Admitting: Cardiothoracic Surgery

## 2013-12-09 ENCOUNTER — Ambulatory Visit (HOSPITAL_COMMUNITY)
Admission: RE | Admit: 2013-12-09 | Discharge: 2013-12-09 | Disposition: A | Discharge status: Home / Self Care | Payer: Medicare Other | Source: Ambulatory Visit | Attending: Cardiothoracic Surgery | Admitting: Cardiothoracic Surgery

## 2013-12-09 ENCOUNTER — Ambulatory Visit (INDEPENDENT_AMBULATORY_CARE_PROVIDER_SITE_OTHER): Payer: Medicare Other | Admitting: Cardiothoracic Surgery

## 2013-12-09 VITALS — BP 130/64 | HR 60 | Resp 16 | Ht 65.0 in | Wt 246.0 lb

## 2013-12-09 DIAGNOSIS — R911 Solitary pulmonary nodule: Secondary | ICD-10-CM

## 2013-12-09 DIAGNOSIS — R0609 Other forms of dyspnea: Secondary | ICD-10-CM | POA: Insufficient documentation

## 2013-12-09 DIAGNOSIS — R0989 Other specified symptoms and signs involving the circulatory and respiratory systems: Secondary | ICD-10-CM | POA: Diagnosis not present

## 2013-12-09 LAB — PULMONARY FUNCTION TEST
DL/VA % pred: 86 %
DL/VA: 4.16 ml/min/mmHg/L
DLCO unc % pred: 78 %
DLCO unc: 18.94 ml/min/mmHg
FEF 25-75 Post: 1.45 L/sec
FEF 25-75 Pre: 1.25 L/sec
FEF2575-%Change-Post: 15 %
FEF2575-%Pred-Post: 87 %
FEF2575-%Pred-Pre: 75 %
FEV1-%Change-Post: 4 %
FEV1-%Pred-Post: 89 %
FEV1-%Pred-Pre: 85 %
FEV1-Post: 1.88 L
FEV1-Pre: 1.79 L
FEV1FVC-%Change-Post: 4 %
FEV1FVC-%Pred-Pre: 96 %
FEV6-%Change-Post: 0 %
FEV6-%Pred-Post: 93 %
FEV6-%Pred-Pre: 93 %
FEV6-Post: 2.5 L
FEV6-Pre: 2.49 L
FEV6FVC-%Pred-Post: 105 %
FEV6FVC-%Pred-Pre: 105 %
FVC-%Change-Post: 0 %
FVC-%Pred-Post: 89 %
FVC-%Pred-Pre: 88 %
FVC-Post: 2.5 L
FVC-Pre: 2.49 L
Post FEV1/FVC ratio: 75 %
Post FEV6/FVC ratio: 100 %
Pre FEV1/FVC ratio: 72 %
Pre FEV6/FVC Ratio: 100 %
RV % pred: 107 %
RV: 2.46 L
TLC % pred: 100 %
TLC: 5.05 L

## 2013-12-09 MED ORDER — ALBUTEROL SULFATE (2.5 MG/3ML) 0.083% IN NEBU
2.5000 mg | INHALATION_SOLUTION | Freq: Once | RESPIRATORY_TRACT | Status: AC
  Administered 2013-12-09: 2.5 mg via RESPIRATORY_TRACT

## 2013-12-09 NOTE — Patient Instructions (Addendum)
Stop Plavix Continue ASA 81 mg day  Pulmonary Nodule A pulmonary nodule is a small, round growth of tissue in the lung. Pulmonary nodules can range in size from less than 1/5 inch (4 mm) to a little bigger than an inch (25 mm). Most pulmonary nodules are detected when imaging tests of the lung are being performed for a different problem. Pulmonary nodules are usually not cancerous (benign). However, some pulmonary nodules are cancerous (malignant). Follow-up treatment or testing is based on the size of the pulmonary nodule and your risk of getting lung cancer.  CAUSES Benign pulmonary nodules can be caused by various things. Some of the causes include:   Bacterial, fungal, or viral infections. This is usually an old infection that is no longer active, but it can sometimes be a current, active infection.  A benign mass of tissue.  Inflammation from conditions such as rheumatoid arthritis.   Abnormal blood vessels in the lungs. Malignant pulmonary nodules can result from lung cancer or from cancers that spread to the lung from other places in the body. SIGNS AND SYMPTOMS Pulmonary nodules usually do not cause symptoms. DIAGNOSIS Most often, pulmonary nodules are found incidentally when an X-ray or CT scan is performed to look for some other problem in the lung area. To help determine whether a pulmonary nodule is benign or malignant, your health care provider will take a medical history and order a variety of tests. Tests done may include:   Blood tests.  A skin test called a tuberculin test. This test is used to determine if you have been exposed to the germ that causes tuberculosis.   Chest X-rays. If possible, a new X-ray may be compared with X-rays you have had in the past.   CT scan. This test shows smaller pulmonary nodules more clearly than an X-ray.   Positron emission tomography (PET) scan. In this test, a safe amount of a radioactive substance is injected into the  bloodstream. Then, the scan takes a picture of the pulmonary nodule. The radioactive substance is eliminated from your body in your urine.   Biopsy. A tiny piece of the pulmonary nodule is removed so it can be checked under a microscope. TREATMENT  Pulmonary nodules that are benign normally do not require any treatment because they usually do not cause symptoms or breathing problems. Your health care provider may want to monitor the pulmonary nodule through follow-up CT scans. The frequency of these CT scans will vary based on the size of the nodule and the risk factors for lung cancer. For example, CT scans will need to be done more frequently if the pulmonary nodule is larger and if you have a history of smoking and a family history of cancer. Further testing or biopsies may be done if any follow-up CT scan shows that the size of the pulmonary nodule has increased. HOME CARE INSTRUCTIONS  Only take over-the-counter or prescription medicines as directed by your health care provider.  Keep all follow-up appointments with your health care provider. SEEK MEDICAL CARE IF:  You have trouble breathing when you are active.   You feel sick or unusually tired.   You do not feel like eating.   You lose weight without trying to.   You develop chills or night sweats.  SEEK IMMEDIATE MEDICAL CARE IF:  You cannot catch your breath, or you begin wheezing.   You cannot stop coughing.   You cough up blood.   You become dizzy or feel like you  are going to pass out.   You have sudden chest pain.   You have a fever or persistent symptoms for more than 2 3 days.   You have a fever and your symptoms suddenly get worse. MAKE SURE YOU:  Understand these instructions.  Will watch your condition.  Will get help right away if you are not doing well or get worse. Document Released: 08/18/2009 Document Revised: 06/23/2013 Document Reviewed: 04/12/2013 [facility] Patient Information 2014  [place]. Video-Assisted Thoracic Surgery Video-assisted thoracic surgery (VATS) is a procedure that allows your caregiver to look inside your chest and do some minor operations. VATS is commonly done to:  Study or diagnose problems in the chest.  Take a tissue sample (biopsy).   Put medications directly into the chest.   Remove collections of fluid, pus, or blood.  Remove tumors. VATS is done using thoracoscopy. Thoracoscopy is a procedure in which a thin, lighted tube (thoracoscope) is put through a small surgical cut (incision)inyour chest wall. The thoracoscope used for VATS has a video camera on it. It allows your caregiver to see the inside of your chest on a television screen. LET YOUR CAREGIVER KNOW ABOUT:   Any allergies you have.  All medications you are taking, including vitamins, herbs, eyedrops, and over-the-counter medications and creams.  Use of steroids (by mouth or creams).   Previous problems you or members of your family have had with the use of anesthetics.  Any blood disorders you have had.  Any blood thinners, like warfarin, that you take.  Previous surgery.   Any history of heart problems.  Other health problems you RISKS AND COMPLICATIONS  Severe bleeding (hemorrhage).  Injury to nerves or other structures in the chest.   Lung infection.  The chest may need to be opened with a large incision (thoracotomy) if the procedure cannot be completed safely with the thoracoscope. BEFORE THE PROCEDURE   Tests such as blood tests, urine tests, chest X-rays, and electrocardiography may be done.  Follow your caregiver's instructions if you are taking dietary supplements or medications. Your caregiver may tell you to stop taking them or to reduce your dosage.  Do not take new dietary supplements or medications within 1 week of your procedure unless your caregiver approves them.  Do not eat or drink within 8 hours of your procedure or as  directed by your caregiver.  Do not smoke for as long as possible before your procedure. If possible, stop smoking 3 6 weeks before the procedure. PROCEDURE   You will be given a medication that makes you sleep (general anesthetic) through a mask, through an intravenous (IV) access tube, or through both. This medication will prevent you from being alert and feeling pain during the procedure.Once you are asleep, abreathing tube or mask may be used to help you breathe.  A video thoracoscope will be placed in a very small incision (less than an inch wide), so that your caregiver can see into your chest. The picture from inside the chest will be displayed on a television screen.  Other surgical tools will be inserted through the remaining incisions.  One of your lungs will be deflated as the surgery is performed. Deflating a lung makes it easier for your caregiver to see the area. The lung will be inflated at the end of the procedure.  The tools will be removed when your caregiver has finished performing the examination or procedure.  A chest tube will be inserted through an incision to drain  the wound. The remaining incisions will be closed with stitches or staples. AFTER THE PROCEDURE  Chest tubes are watched closely for signs of fluid or air buildup in the lungs. Your caregiver will most likely remove them prior to discharge.  You will need to stay in the hospital for 1 5 days. The average time in the hospital with VATS surgery is usually several days less than with standard open surgery.  You may receive medications to help with pain.  It is normal to feel sore for about 2 weeks after the procedure. Document Released: 02/15/2013 Document Reviewed: 02/15/2013 [facility] Patient Information 2014 [place], [place]. Video-Assisted Thoracic Surgery Care After  Refer to this sheet in the next few weeks. These instructions provide you with information on caring for yourself after your procedure.  Your doctor may also give you more specific instructions. Your procedure has been planned according to current medical practices, but problems sometimes occur. Call your doctor if you have any problems or questions after your procedure. HOME CARE   Only take over-the-counter or prescription medicines as told by your doctor.  Only take pain medicines (narcotics) as told by your doctor.  Do not drive until your doctor says it is OK. Driving while taking pain medicines or soon after surgery can be dangerous.  Avoid activities that use your chest muscles for at least 3 4 weeks. These include lifting heavy objects.  Take deep breaths. This expands the lungs and protects against a lung infection (pneumonia).  Do breathing exercises as told by your doctor. If you were given a device to help with breathing, use it as told by your doctor.  You may resume a normal diet and activities when you feel you are able to or as told by your doctor.  Do not take a bath until your doctor says it is OK. Use the shower instead.  Keep the bandage (dressing) covering the area where the chest tube was put (incision site) dry for 48 hours. Remove the bandage after 48 hours unless there is new fluid on the bandage.  Remove bandages as told by your doctor.  Change bandages if necessary or as told by your doctor.  Keep all follow-up doctor visits. It is important to see your doctor after surgery. You may need follow-up care and your condition may need to be watched. GET HELP RIGHT AWAY IF:  You have a fever.  You have chest pain.  You have a rash.  You have shortness of breath.  You have trouble breathing.  You feel weak, lightheaded, dizzy, or faint.  You feel a lot of pain near an area where a surgical cut was made.  The pain at an area where a surgical cut was made is getting worse.  You notice bleeding, fluid, or pus coming from where a surgical cut was made.  You notice irritation, puffiness  (swelling), or redness near the surgical cut.  There is a bad smell coming from from a bandage or from an area where a surgical cut was made.  It feels like your heart is fluttering or beating rapidly.  Your pain medicine does not relieve your pain. MAKE SURE YOU:   Understand these instructions.  Will watch your condition.  Will get help right away if you are not doing well or get worse. Document Released: 02/15/2013 Document Reviewed: 02/15/2013 [facility] Patient Information 2014 [place], [place].

## 2013-12-09 NOTE — Progress Notes (Signed)
[place] Record #[id number] Date of Birth: 1941-02-02  Referring: Tanda Rockers, MD Primary Care: [name], MD  Chief Complaint:    Chief Complaint  Patient presents with  . Lung Lesion    1 week f/u further discuss surgery and  PFT's    History of Present Illness:    Susan Pittman 73 y.o. female is seen in the office last week for incidental finding of a 2.2 cm nodule in the left lower lobe. She is a lifelong NONsmoker. She has had asthma symptoms in the past usually related to "colds". Over the past several years she has been treated for "pneumonia". She's had no definite occupational exposure to asbestos or other hazardous substances. She denies any history or family history of tuberculosis She does note visiting family in the [place] area of [place] for 2 months and 4 months periods. During that time she noted sinus drainage and a "deep cough". She noted being exposed to several [place] dust storms during her visits.  The patient was referred to cardiology because of increasing difficult to control hypertension a renal artery stenosis was diagnosed. Chest x-ray done preprocedure noted a 2 cm left lower lobe lung mass, this led to further evaluation with CT scan and PET scan. The patient is seen in the office today for further evaluation and treatment of the left lung mass of unknown etiology but possibly low grade malignancy. Since she was seen previously pulmonary function studies have been performed. I discussed the case with Dr. Gwenlyn Found and its okay for the patient to discontinue her Plavix prior to thoracic surgery.   Current Activity/ Functional Status:  Patient is independent with mobility/ambulation, transfers, ADL's, IADL's.  Zubrod Score: At the time of surgery this patient's most appropriate activity status/level should be described as: [x]  Normal activity, no symptoms []  Symptoms, fully  ambulatory []  Symptoms, in bed less than or equal to 50% of the time []  Symptoms, in bed greater than 50% of the time but less than 100% []  Bedridden []  Moribund   Past Medical History  Diagnosis Date  . Asthma   . Hypertension   . GERD (gastroesophageal reflux disease)   . Allergy   . Hypothyroid   . Osteoporosis   . Gastric ulcer   . Hiatal hernia   . Hyperlipidemia     "don't take RX for it" (11/08/2013)  . Renal artery stenosis   . Lung mass     superior segment LLL/notes 11/08/2013  . Basal cell carcinoma     "cut them off face & right shoulder" (11/08/2013)  . Pneumonia     "I've had it ?3 times" (11/08/2013)  . Arthritis     "knees, hips, back, hands" (11/08/2013)  . DJD (degenerative joint disease)     Past Surgical History  Procedure Laterality Date  . Knee arthroscopy Bilateral   . Anterior cervical decomp/discectomy fusion  2000    C5-C6  . Renal artery stent Right 11/08/2013    Archie Endo 11/08/2013  . Tonsillectomy and adenoidectomy  ?1946  . Cholecystectomy  1970's  . Vaginal hysterectomy  1970  . Dilation and curettage of uterus  1960's  . Skin cancer excision      Family History  Problem Relation Age of Onset  . Breast cancer Sister   . Emphysema Father  smoked  . Allergies Sister   . Allergies Brother   . Heart disease Mother   . Heart disease Father     History   Social History  . Marital Status: Married    Spouse Name: N/A    Number of Children: N/A  . Years of Education: N/A   Occupational History  . Retired    Social History Main Topics  . Smoking status: Never Smoker   . Smokeless tobacco: Never Used  . Alcohol Use: No  . Drug Use: No  . Sexual Activity: Yes     History  Smoking status  . Never Smoker   Smokeless tobacco  . Never Used    History  Alcohol Use No     Allergies  Allergen Reactions  . Benazepril Hcl Anaphylaxis and Cough  . Celecoxib     REACTION: aching  . Lisinopril     REACTION: cough  . Loratadine      anaphylaxis  . Allegra [Fexofenadine Hcl]     Severe back pain  . Sulfa Antibiotics Hives    Current Outpatient Prescriptions  Medication Sig Dispense Refill  . amLODipine (NORVASC) 10 MG tablet Take 1 tablet (10 mg total) by mouth daily.  30 tablet  5  . aspirin EC 325 MG EC tablet Take 1 tablet (325 mg total) by mouth daily.  30 tablet  0  . Calcium 600-200 MG-UNIT per tablet Take 1 tablet by mouth 2 (two) times daily. Takes at [employer]      . cetirizine (ZYRTEC) 10 MG tablet Take 10 mg by mouth at bedtime.       . clopidogrel (PLAVIX) 75 MG tablet Take 1 tablet (75 mg total) by mouth daily with breakfast.  30 tablet  5  . etodolac (LODINE) 400 MG tablet Take 400 mg by mouth 2 (two) times daily.      . fluticasone (FLONASE) 50 MCG/ACT nasal spray Place 2 sprays into both nostrils daily as needed for allergies or rhinitis.      Marland Kitchen lansoprazole (PREVACID) 15 MG capsule Take 15 mg by mouth daily.        Marland Kitchen levothyroxine (SYNTHROID, LEVOTHROID) 88 MCG tablet Take 88 mcg by mouth daily before breakfast.      . Multiple Vitamins-Minerals (MULTI FOR HER 50+ PO) Take 1 tablet by mouth daily.       . nebivolol (BYSTOLIC) 10 MG tablet Take 2 tablets (20 mg total) by mouth daily.  60 tablet  2  . valsartan-hydrochlorothiazide (DIOVAN-HCT) 320-25 MG per tablet Take 1 tablet by mouth daily.       No current facility-administered medications for this visit.     REVIEW OF SYSTEMS:   Review of Systems:     Cardiac Review of Systems: Y or N  Chest Pain [  n  ]  Resting SOB [ n  ] Exertional SOB  [ n ]  Orthopnea [ n ]   Pedal Edema [n   ]    Palpitations [n  ] Syncope  [ n ]   Presyncope [ n  ]  General Review of Systems: [Y] = yes [  ]=no Constitional: recent weight change [ n ];  Wt loss over the last 3 months [ none  ] anorexia [  n]; fatigue [ n ]; nausea [n]; night sweats [ n ]; fever [  n]; or chills [n];          Dental: poor dentition[ n ]; Last  Dentist visit:   Eye : blurred  vision [  ]; diplopia [   ]; vision changes Blue.Reese  ];  Amaurosis fugax[  ]; Resp: cough Blue.Reese  ];  wheezing[ y ];  hemoptysis[n  ]; shortness of breath[  n ]; paroxysmal nocturnal dyspnea[ n ]; dyspnea on exertion[ n ]; or orthopnea[n  ];  GI:  gallstones[removed  ], vomiting[n  ];  dysphagia[  ]; melena[  n];  hematochezia [  ]; heartburn[  ];   Hx of  Colonoscopy[ y due 72 ]; GU: kidney stones [  ]; hematuria[ n ];   dysuria [  ];  nocturia[  ];  history of     obstruction [  ]; urinary frequency [ n ]             Skin: rash, swelling[  ];, hair loss[  ];  peripheral edema[  ];  or itching[  ]; Musculosketetal: myalgias[  ];  joint swelling[ y ];  joint erythema[  ];  joint pain[y  ];  back pain[  ];  Heme/Lymph: bruising[  ];  bleeding[  ];  anemia[  ];  Neuro: TIA[  ];  headaches[  ];  stroke[  ];  vertigo[  ];  seizures[  ];   paresthesias[ n ];  difficulty walking[n  ];  Psych:depression[  ]; anxiety[  ];  Endocrine: diabetes[  ];  thyroid dysfunction[  ];  Immunizations: Flu up to date [ y ]; Pneumococcal up to date Blue.Reese  ];  Other:  Physical Exam: BP 130/64  Pulse 60  Resp 16  Ht 5\' 5"  (1.651 m)  Wt 246 lb (111.585 kg)  BMI 40.94 kg/m2  SpO2 94%  PHYSICAL EXAMINATION:  General appearance: alert, cooperative, appears stated age and no distress Neurologic: intact Heart: regular rate and rhythm, S1, S2 normal, no murmur, click, rub or gallop Lungs: clear to auscultation bilaterally Abdomen: soft, non-tender; bowel sounds normal; no masses,  no organomegaly Extremities: evidence of previous bilaterial knee surgery Patient has no carotid bruits, no palpable cervical or supraclavicular or axillary adenopathy   Diagnostic Studies & Laboratory data:     Recent Radiology Findings:   Dg Chest 2 View  11/02/2013   CLINICAL DATA:  Preop for renal surgery.  Hypertension.  EXAM: CHEST  2 VIEW  COMPARISON:  01/28/2008  FINDINGS: Probable cholecystectomy. Lower cervical spine fixation.  Midline trachea. Borderline cardiomegaly. Aortic atherosclerosis. Mediastinal contours otherwise within normal limits. No pleural effusion or pneumothorax. Subtle nodular density projecting over the left upper lobe on the frontal radiograph, measuring 2.2 cm.  IMPRESSION: Suspicion of a 2.2 cm left upper lobe pulmonary nodule. Recommend further characterization with chest CT, preferably with contrast.  These results will be called to the ordering clinician or representative by the Radiologist Assistant, and communication documented in the PACS Dashboard.   Electronically Signed   By: Abigail Miyamoto M.D.   On: 11/02/2013 13:29   Ct Chest W Contrast  11/05/2013   CLINICAL DATA:  Pulmonary nodule. Abnormal chest x-ray on 11/02/2013  EXAM: CT CHEST WITH CONTRAST  TECHNIQUE: Multidetector CT imaging of the chest was performed during intravenous contrast administration.  CONTRAST:  73mL OMNIPAQUE IOHEXOL 300 MG/ML  SOLN  COMPARISON:  Chest x-rays dated 11/02/2013, 01/28/2008, and 01/23/2007  FINDINGS: There is a 21 x 18 x 14 mm ill-defined inhomogeneous mass in the superior segment of the left lower lobe worrisome for non-small cell lung cancer.  There is no hilar or mediastinal  adenopathy. The lungs are otherwise clear. Heart size and pulmonary vascularity are normal. No effusions. No osseous abnormality.  There is slight hepatic steatosis.  IMPRESSION: 21 mm ill-defined mass in the superior segment of the left lower lobe. The appearance is typical for non-small-cell lung cancer.   Electronically Signed   By: Rozetta Nunnery M.D.   On: 11/05/2013 10:20   Nm Pet Image Initial (pi) Skull Base To Thigh  11/23/2013   CLINICAL DATA:  Initial treatment strategy for left lower lobe pulmonary nodule.  EXAM: NUCLEAR MEDICINE PET SKULL BASE TO THIGH  FASTING BLOOD GLUCOSE:  Value: 99mg /dl  TECHNIQUE: 18.9 mCi F-18 FDG was injected intravenously. CT data was obtained and used for attenuation correction and anatomic localization  only. (This was not acquired as a diagnostic CT examination.) Additional exam technical data entered on technologist worksheet.  COMPARISON:  CT CHEST W/CM dated 11/05/2013; DG CHEST 2 VIEW dated 11/02/2013  FINDINGS: NECK  No hypermetabolic cervical lymph nodes are identified.There are no lesions of the pharyngeal mucosal space.  CHEST  The sub solid nodule in the superior segment of the left lower lobe demonstrates metabolic activity which is only slightly greater than background. SUV max is 1.8. This nodule measures 2.1 x 1.6 cm on image number 80. No hypermetabolic pulmonary nodules are demonstrated. There is no hypermetabolic mediastinal or hilar nodal activity.  ABDOMEN/PELVIS  There is no hypermetabolic activity within the liver, adrenal glands, spleen or pancreas. There is no hypermetabolic nodal activity. There is nonspecific low level activity associate with the stomach and sigmoid colon, the latter demonstrating mild diverticulosis. Patient's by is status post cholecystectomy and partial hysterectomy.  SKELETON  There is no hypermetabolic activity to suggest osseous metastatic disease.  IMPRESSION: 1. Dominant sub solid left lower lobe nodule does not demonstrate significantly increased metabolic activity. However, morphologically, adenocarcinoma is still a concern. Thoracic surgery consultation is recommended. These recommendations are taken from "Recommendations for the Management of Subsolid Pulmonary Nodules Detected at CT: A Statement from the [place]" Radiology 2013; 266:1, 304-317. 2. No evidence of metastatic disease.   Electronically Signed   By: Camie Patience M.D.   On: 11/23/2013 10:30      Recent Lab Findings: Lab Results  Component Value Date   WBC 8.3 11/09/2013   HGB 13.1 11/09/2013   HCT 39.6 11/09/2013   PLT 252 11/09/2013   GLUCOSE 91 11/09/2013   CHOL 180 10/13/2013   TRIG 105 10/13/2013   HDL 52 10/13/2013   LDLDIRECT 126.3 09/10/2012   LDLCALC 107* 10/13/2013   ALT 14  10/13/2013   AST 15 10/13/2013   NA 140 11/09/2013   K 4.1 11/09/2013   CL 100 11/09/2013   CREATININE 1.11* 11/09/2013   BUN 31* 11/09/2013   CO2 25 11/09/2013   TSH 3.158 10/13/2013   INR 1.02 11/02/2013   FEV1 1.79 85% DLCO 18.9 %78   Assessment / Plan:   1.)  2.2 cm semisolid left lower lobe lung nodule and lifelong nonsmoker, with possible exposure to Coccidioides. The lesion also could easily be bronchial alveolar carcinoma. The possibility of diagnosis of lung cancer is reviewed with the patient. Primary video assisted thoracoscopy and resection versus CT-guided needle biopsy first and depending on results proceeding with surgical resection. After discussing the risks and options of each approach the patient is agreeable with proceeding with left video-assisted thoracoscopy wedge resection of the lesion possible lobectomy. She will stop her Plavix today and we will plan to proceed  on Wednesday, February 11 with bronchoscopy left video-assisted thoracoscopy wedge resection possible lobectomy.   2.) renal vascular hypertension status post stent stable renal function      Grace Isaac MD      [address] [place] [address] Office [phone]   [place]  12/09/2013 1:12 PM

## 2013-12-12 ENCOUNTER — Encounter (HOSPITAL_BASED_OUTPATIENT_CLINIC_OR_DEPARTMENT_OTHER): Payer: Self-pay | Admitting: Emergency Medicine

## 2013-12-12 ENCOUNTER — Other Ambulatory Visit: Payer: Self-pay

## 2013-12-12 ENCOUNTER — Inpatient Hospital Stay (HOSPITAL_BASED_OUTPATIENT_CLINIC_OR_DEPARTMENT_OTHER)
Admission: EM | Admit: 2013-12-12 | Discharge: 2013-12-14 | DRG: 287 | Disposition: A | Discharge status: Home / Self Care | Payer: Medicare Other | Attending: Cardiovascular Disease | Admitting: Cardiovascular Disease

## 2013-12-12 DIAGNOSIS — E039 Hypothyroidism, unspecified: Secondary | ICD-10-CM | POA: Diagnosis not present

## 2013-12-12 DIAGNOSIS — R079 Chest pain, unspecified: Secondary | ICD-10-CM | POA: Diagnosis not present

## 2013-12-12 DIAGNOSIS — R911 Solitary pulmonary nodule: Secondary | ICD-10-CM | POA: Diagnosis present

## 2013-12-12 DIAGNOSIS — I10 Essential (primary) hypertension: Secondary | ICD-10-CM | POA: Diagnosis present

## 2013-12-12 DIAGNOSIS — I2510 Atherosclerotic heart disease of native coronary artery without angina pectoris: Secondary | ICD-10-CM | POA: Diagnosis not present

## 2013-12-12 DIAGNOSIS — I150 Renovascular hypertension: Secondary | ICD-10-CM | POA: Diagnosis not present

## 2013-12-12 DIAGNOSIS — E785 Hyperlipidemia, unspecified: Secondary | ICD-10-CM | POA: Diagnosis present

## 2013-12-12 DIAGNOSIS — I739 Peripheral vascular disease, unspecified: Secondary | ICD-10-CM | POA: Diagnosis not present

## 2013-12-12 DIAGNOSIS — Z981 Arthrodesis status: Secondary | ICD-10-CM | POA: Diagnosis not present

## 2013-12-12 DIAGNOSIS — Z8249 Family history of ischemic heart disease and other diseases of the circulatory system: Secondary | ICD-10-CM

## 2013-12-12 DIAGNOSIS — C4490 Unspecified malignant neoplasm of skin, unspecified: Secondary | ICD-10-CM

## 2013-12-12 DIAGNOSIS — R222 Localized swelling, mass and lump, trunk: Secondary | ICD-10-CM | POA: Diagnosis not present

## 2013-12-12 DIAGNOSIS — Z85828 Personal history of other malignant neoplasm of skin: Secondary | ICD-10-CM | POA: Diagnosis not present

## 2013-12-12 DIAGNOSIS — I701 Atherosclerosis of renal artery: Secondary | ICD-10-CM | POA: Diagnosis present

## 2013-12-12 LAB — CBC WITH DIFFERENTIAL/PLATELET
BASOS PCT: 1 % (ref 0–1)
Basophils Absolute: 0 10*3/uL (ref 0.0–0.1)
EOS ABS: 0.2 10*3/uL (ref 0.0–0.7)
Eosinophils Relative: 3 % (ref 0–5)
HEMATOCRIT: 41.8 % (ref 36.0–46.0)
HEMOGLOBIN: 13.5 g/dL (ref 12.0–15.0)
LYMPHS ABS: 1.7 10*3/uL (ref 0.7–4.0)
Lymphocytes Relative: 22 % (ref 12–46)
MCH: 28.7 pg (ref 26.0–34.0)
MCHC: 32.3 g/dL (ref 30.0–36.0)
MCV: 88.9 fL (ref 78.0–100.0)
MONOS PCT: 6 % (ref 3–12)
Monocytes Absolute: 0.5 10*3/uL (ref 0.1–1.0)
Neutro Abs: 5.5 10*3/uL (ref 1.7–7.7)
Neutrophils Relative %: 69 % (ref 43–77)
Platelets: 267 10*3/uL (ref 150–400)
RBC: 4.7 MIL/uL (ref 3.87–5.11)
RDW: 14.8 % (ref 11.5–15.5)
WBC: 7.9 10*3/uL (ref 4.0–10.5)

## 2013-12-12 LAB — BASIC METABOLIC PANEL
BUN: 30 mg/dL — ABNORMAL HIGH (ref 6–23)
CHLORIDE: 100 meq/L (ref 96–112)
CO2: 26 mEq/L (ref 19–32)
Calcium: 9.3 mg/dL (ref 8.4–10.5)
Creatinine, Ser: 1 mg/dL (ref 0.50–1.10)
GFR calc Af Amer: 62 mL/min — ABNORMAL LOW (ref >90)
GFR calc non Af Amer: 54 mL/min — ABNORMAL LOW (ref >90)
GLUCOSE: 155 mg/dL — AB (ref 70–99)
POTASSIUM: 4.2 meq/L (ref 3.7–5.3)
Sodium: 140 mEq/L (ref 137–147)

## 2013-12-12 LAB — TROPONIN I
Troponin I: <0.3 ng/mL (ref <0.30)
Troponin I: <0.3 ng/mL (ref <0.30)
Troponin I: <0.3 ng/mL (ref <0.30)

## 2013-12-12 MED ORDER — CALCIUM 600-200 MG-UNIT PO TABS: 1.0000 | ORAL_TABLET | Freq: Two times a day (BID) | ORAL | Status: DC

## 2013-12-12 MED ORDER — NITROGLYCERIN 0.4 MG SL SUBL: 0.4000 mg | SUBLINGUAL_TABLET | SUBLINGUAL | Status: DC | PRN

## 2013-12-12 MED ORDER — HYDROCHLOROTHIAZIDE 25 MG PO TABS
25.0000 mg | ORAL_TABLET | Freq: Every day | ORAL | Status: DC
  Administered 2013-12-14: 25 mg via ORAL
  Filled 2013-12-12 (×3): qty 1

## 2013-12-12 MED ORDER — LEVOTHYROXINE SODIUM 88 MCG PO TABS
88.0000 ug | ORAL_TABLET | Freq: Every day | ORAL | Status: DC
  Administered 2013-12-13 – 2013-12-14 (×2): 88 ug via ORAL
  Filled 2013-12-12 (×3): qty 1

## 2013-12-12 MED ORDER — IRBESARTAN 300 MG PO TABS
300.0000 mg | ORAL_TABLET | Freq: Every day | ORAL | Status: DC
  Administered 2013-12-13 – 2013-12-14 (×2): 300 mg via ORAL
  Filled 2013-12-12 (×3): qty 1

## 2013-12-12 MED ORDER — CALCIUM CARBONATE-VITAMIN D 500-200 MG-UNIT PO TABS
1.0000 | ORAL_TABLET | Freq: Two times a day (BID) | ORAL | Status: DC
  Administered 2013-12-12 – 2013-12-14 (×4): 1 via ORAL
  Filled 2013-12-12 (×5): qty 1

## 2013-12-12 MED ORDER — ENOXAPARIN SODIUM 40 MG/0.4ML ~~LOC~~ SOLN
40.0000 mg | Freq: Every day | SUBCUTANEOUS | Status: DC
  Administered 2013-12-12: 40 mg via SUBCUTANEOUS
  Filled 2013-12-12 (×2): qty 0.4

## 2013-12-12 MED ORDER — AMLODIPINE BESYLATE 10 MG PO TABS
10.0000 mg | ORAL_TABLET | Freq: Every day | ORAL | Status: DC
  Administered 2013-12-13 – 2013-12-14 (×2): 10 mg via ORAL
  Filled 2013-12-12 (×3): qty 1

## 2013-12-12 MED ORDER — ALPRAZOLAM 0.25 MG PO TABS: 0.2500 mg | ORAL_TABLET | Freq: Two times a day (BID) | ORAL | Status: DC | PRN

## 2013-12-12 MED ORDER — HYDRALAZINE HCL 25 MG PO TABS
25.0000 mg | ORAL_TABLET | Freq: Four times a day (QID) | ORAL | Status: DC | PRN
  Administered 2013-12-13: 25 mg via ORAL
  Filled 2013-12-12: qty 1

## 2013-12-12 MED ORDER — ONDANSETRON HCL 4 MG/2ML IJ SOLN: 4.0000 mg | Freq: Four times a day (QID) | INTRAMUSCULAR | Status: DC | PRN

## 2013-12-12 MED ORDER — ETODOLAC 400 MG PO TABS
400.0000 mg | ORAL_TABLET | Freq: Two times a day (BID) | ORAL | Status: DC
  Administered 2013-12-12 – 2013-12-14 (×4): 400 mg via ORAL
  Filled 2013-12-12 (×5): qty 1

## 2013-12-12 MED ORDER — NEBIVOLOL HCL 10 MG PO TABS
20.0000 mg | ORAL_TABLET | Freq: Every day | ORAL | Status: DC
  Administered 2013-12-13 – 2013-12-14 (×2): 20 mg via ORAL
  Filled 2013-12-12 (×2): qty 2

## 2013-12-12 MED ORDER — FLUTICASONE PROPIONATE 50 MCG/ACT NA SUSP: 2.0000 | Freq: Every day | NASAL | Status: DC | PRN

## 2013-12-12 MED ORDER — VALSARTAN-HYDROCHLOROTHIAZIDE 320-25 MG PO TABS: 1.0000 | ORAL_TABLET | Freq: Every day | ORAL | Status: DC

## 2013-12-12 MED ORDER — PANTOPRAZOLE SODIUM 40 MG PO TBEC
40.0000 mg | DELAYED_RELEASE_TABLET | Freq: Every day | ORAL | Status: DC
  Administered 2013-12-13 – 2013-12-14 (×2): 40 mg via ORAL
  Filled 2013-12-12 (×2): qty 1

## 2013-12-12 MED ORDER — ACETAMINOPHEN 325 MG PO TABS: 650.0000 mg | ORAL_TABLET | ORAL | Status: DC | PRN

## 2013-12-12 NOTE — ED Provider Notes (Signed)
CSN: [id number]     Arrival date & time 12/12/13  0913 History   First MD Initiated Contact with Patient 12/12/13 0940     Chief Complaint  Patient presents with  . Chest Pain   (Consider location/radiation/quality/duration/timing/severity/associated sxs/prior Treatment) Patient is a 73 y.o. female presenting with chest pain.  Chest Pain  Complains of chest pain in the center of her chest intermittent onset last pain is intermittent lasting anywhere from 2-20 seconds at a time. Pain is nonradiating. Last night pain was sharp. this morning pain is dull. She is presently asymptomatic. No other associated symptoms. No shortness of breath no sweatiness no nausea. No cough no fever. No treatment prior to coming here. Past Medical History  Diagnosis Date  . Asthma   . Hypertension   . GERD (gastroesophageal reflux disease)   . Allergy   . Hypothyroid   . Osteoporosis   . Gastric ulcer   . Hiatal hernia   . Hyperlipidemia     "don't take RX for it" (11/08/2013)  . Renal artery stenosis   . Lung mass     superior segment LLL/notes 11/08/2013  . Basal cell carcinoma     "cut them off face & right shoulder" (11/08/2013)  . Pneumonia     "I've had it ?3 times" (11/08/2013)  . Arthritis     "knees, hips, back, hands" (11/08/2013)  . DJD (degenerative joint disease)    Past Surgical History  Procedure Laterality Date  . Knee arthroscopy Bilateral   . Anterior cervical decomp/discectomy fusion  2000    C5-C6  . Renal artery stent Right 11/08/2013    Archie Endo 11/08/2013  . Tonsillectomy and adenoidectomy  ?1946  . Cholecystectomy  1970's  . Vaginal hysterectomy  1970  . Dilation and curettage of uterus  1960's  . Skin cancer excision     Family History  Problem Relation Age of Onset  . Breast cancer Sister   . Emphysema Father     smoked  . Allergies Sister   . Allergies Brother   . Heart disease Mother   . Heart disease Father    History  Substance Use Topics  . Smoking status: Never  Smoker   . Smokeless tobacco: Never Used  . Alcohol Use: No   OB History   Grav Para Term Preterm Abortions TAB SAB Ect Mult Living                 Review of Systems  Constitutional: Negative.   HENT: Negative.   Respiratory: Negative.   Cardiovascular: Positive for chest pain.  Gastrointestinal: Negative.   Musculoskeletal: Negative.   Skin: Negative.   Neurological: Negative.   Psychiatric/Behavioral: Negative.   All other systems reviewed and are negative.    Allergies  Benazepril hcl; Celecoxib; Lisinopril; Loratadine; Allegra; and Sulfa antibiotics  Home Medications   Current Outpatient Rx  Name  Route  Sig  Dispense  Refill  . clopidogrel (PLAVIX) 75 MG tablet   Oral   Take 1 tablet (75 mg total) by mouth daily with breakfast.   30 tablet   5   . amLODipine (NORVASC) 10 MG tablet   Oral   Take 1 tablet (10 mg total) by mouth daily.   30 tablet   5   . aspirin EC 325 MG EC tablet   Oral   Take 1 tablet (325 mg total) by mouth daily.   30 tablet   0   . Calcium 600-200 MG-UNIT per tablet  Oral   Take 1 tablet by mouth 2 (two) times daily. Takes at [employer]         . cetirizine (ZYRTEC) 10 MG tablet   Oral   Take 10 mg by mouth at bedtime.          Marland Kitchen etodolac (LODINE) 400 MG tablet   Oral   Take 400 mg by mouth 2 (two) times daily.         . fluticasone (FLONASE) 50 MCG/ACT nasal spray   Each Nare   Place 2 sprays into both nostrils daily as needed for allergies or rhinitis.         Marland Kitchen lansoprazole (PREVACID) 15 MG capsule   Oral   Take 15 mg by mouth daily.           Marland Kitchen levothyroxine (SYNTHROID, LEVOTHROID) 88 MCG tablet   Oral   Take 88 mcg by mouth daily before breakfast.         . Multiple Vitamins-Minerals (MULTI FOR HER 50+ PO)   Oral   Take 1 tablet by mouth daily.          . nebivolol (BYSTOLIC) 10 MG tablet   Oral   Take 2 tablets (20 mg total) by mouth daily.   60 tablet   2   .  valsartan-hydrochlorothiazide (DIOVAN-HCT) 320-25 MG per tablet   Oral   Take 1 tablet by mouth daily.          BP 190/95  Pulse 56  Temp(Src) 98.2 F (36.8 C) (Oral)  Resp 18  SpO2 99% Physical Exam  Nursing note and vitals reviewed. Constitutional: She appears well-developed and well-nourished.  HENT:  Head: Normocephalic and atraumatic.  Eyes: Conjunctivae are normal. Pupils are equal, round, and reactive to light.  Neck: Neck supple. No tracheal deviation present. No thyromegaly present.  Cardiovascular: Normal rate.   No murmur heard. Irregular  Pulmonary/Chest: Effort normal and breath sounds normal.  Abdominal: Soft. Bowel sounds are normal. She exhibits no distension. There is no tenderness.  Musculoskeletal: Normal range of motion. She exhibits no edema and no tenderness.  Neurological: She is alert. Coordination normal.  Skin: Skin is warm and dry. No rash noted.  Psychiatric: She has a normal mood and affect.    ED Course  Procedures (including critical care time) Labs Review Labs Reviewed - No data to display Imaging Review No results found.  EKG Interpretation   None     . Date: 12/12/2013  Rate: 60  Rhythm: premature atrial contractions (PAC)  QRS Axis: normal  Intervals: normal  ST/T Wave abnormalities: normal  Conduction Disutrbances:none  Narrative Interpretation:   Old EKG Reviewed: PACs new from 07/01/2000 otherwise no significant change interpreted by me Results for orders placed during the hospital encounter of 12/12/13  TROPONIN I      Result Value Range   Troponin I <0.30  <0.30 ng/mL  BASIC METABOLIC PANEL      Result Value Range   Sodium 140  137 - 147 mEq/L   Potassium 4.2  3.7 - 5.3 mEq/L   Chloride 100  96 - 112 mEq/L   CO2 26  19 - 32 mEq/L   Glucose, Bld 155 (*) 70 - 99 mg/dL   BUN 30 (*) 6 - 23 mg/dL   Creatinine, Ser 1.00  0.50 - 1.10 mg/dL   Calcium 9.3  8.4 - 10.5 mg/dL   GFR calc non Af Amer 54 (*) >90 mL/min   GFR  calc Af Amer 62 (*) >90 mL/min  CBC WITH DIFFERENTIAL      Result Value Range   WBC 7.9  4.0 - 10.5 K/uL   RBC 4.70  3.87 - 5.11 MIL/uL   Hemoglobin 13.5  12.0 - 15.0 g/dL   HCT 41.8  36.0 - 46.0 %   MCV 88.9  78.0 - 100.0 fL   MCH 28.7  26.0 - 34.0 pg   MCHC 32.3  30.0 - 36.0 g/dL   RDW 14.8  11.5 - 15.5 %   Platelets 267  150 - 400 K/uL   Neutrophils Relative % 69  43 - 77 %   Neutro Abs 5.5  1.7 - 7.7 K/uL   Lymphocytes Relative 22  12 - 46 %   Lymphs Abs 1.7  0.7 - 4.0 K/uL   Monocytes Relative 6  3 - 12 %   Monocytes Absolute 0.5  0.1 - 1.0 K/uL   Eosinophils Relative 3  0 - 5 %   Eosinophils Absolute 0.2  0.0 - 0.7 K/uL   Basophils Relative 1  0 - 1 %   Basophils Absolute 0.0  0.0 - 0.1 K/uL   Nm Pet Image Initial (pi) Skull Base To Thigh  11/23/2013   CLINICAL DATA:  Initial treatment strategy for left lower lobe pulmonary nodule.  EXAM: NUCLEAR MEDICINE PET SKULL BASE TO THIGH  FASTING BLOOD GLUCOSE:  Value: 99mg /dl  TECHNIQUE: 18.9 mCi F-18 FDG was injected intravenously. CT data was obtained and used for attenuation correction and anatomic localization only. (This was not acquired as a diagnostic CT examination.) Additional exam technical data entered on technologist worksheet.  COMPARISON:  CT CHEST W/CM dated 11/05/2013; DG CHEST 2 VIEW dated 11/02/2013  FINDINGS: NECK  No hypermetabolic cervical lymph nodes are identified.There are no lesions of the pharyngeal mucosal space.  CHEST  The sub solid nodule in the superior segment of the left lower lobe demonstrates metabolic activity which is only slightly greater than background. SUV max is 1.8. This nodule measures 2.1 x 1.6 cm on image number 80. No hypermetabolic pulmonary nodules are demonstrated. There is no hypermetabolic mediastinal or hilar nodal activity.  ABDOMEN/PELVIS  There is no hypermetabolic activity within the liver, adrenal glands, spleen or pancreas. There is no hypermetabolic nodal activity. There is nonspecific  low level activity associate with the stomach and sigmoid colon, the latter demonstrating mild diverticulosis. Patient's by is status post cholecystectomy and partial hysterectomy.  SKELETON  There is no hypermetabolic activity to suggest osseous metastatic disease.  IMPRESSION: 1. Dominant sub solid left lower lobe nodule does not demonstrate significantly increased metabolic activity. However, morphologically, adenocarcinoma is still a concern. Thoracic surgery consultation is recommended. These recommendations are taken from "Recommendations for the Management of Subsolid Pulmonary Nodules Detected at CT: A Statement from the [place]" Radiology 2013; 266:1, 304-317. 2. No evidence of metastatic disease.   Electronically Signed   By: Camie Patience M.D.   On: 11/23/2013 10:30     MDM  No diagnosis found. Patient is scheduled to get elective VATS procedure of left lung mass this week. In light of elective surgery. I contacted Dr.Nishan from cardiology who will arrange inpatient stay at [facility], telemetry. Patient has taken 81 mg aspirin earlier today. Will not administer further aspirin or Plavix Diagnosis#1 chest pain #2 hyperglycemia   Orlie Dakin, MD 12/12/13 1053

## 2013-12-12 NOTE — H&P (Signed)
Patient ID: Susan Pittman MRN: [id number], DOB/AGE: Mar 19, 1941   Admit date: 12/12/2013   Primary Physician: [name], MD Primary Cardiologist: Dr Claiborne Billings  HPI: Pleasant 73 y/o female followed by Dr Claiborne Billings. She has renovascular HTN nad had a Lt renal artery stent 11/08/13. She has no history of CAD, and in fact, had a remote cath that showed no significant CAD. During her admission for renal stent a lung nodule was found. She has no history o smoking but there is concern this could be lung CA. She is scheduled for VATS 12/14/13. Her ASA and Plavix have been stopped.           Early this am she had some chest pain that was unusual for her. She describes a sharp fleeting pain but later on she admits she has had some "pressure" and tightness" between her shoulder blades. She denies any radiation, SOB, or diaphoresis. She presented to [facility] ER this am and is now transferred to [facility]. She is pain free.    Problem List: Past Medical History  Diagnosis Date  . Asthma   . Hypertension   . GERD (gastroesophageal reflux disease)   . Allergy   . Hypothyroid   . Osteoporosis   . Gastric ulcer   . Hiatal hernia   . Hyperlipidemia     "don't take RX for it" (11/08/2013)  . Renal artery stenosis   . Lung mass     superior segment LLL/notes 11/08/2013  . Pneumonia     "I've had it ?3 times" (11/08/2013)  . Arthritis     "knees, hips, back, hands" (11/08/2013)  . DJD (degenerative joint disease)   . Basal cell carcinoma     "cut them off face & right shoulder" (11/08/2013)    Past Surgical History  Procedure Laterality Date  . Knee arthroscopy Bilateral   . Anterior cervical decomp/discectomy fusion  2000    C5-C6  . Renal artery stent Right 11/08/2013    Archie Endo 11/08/2013  . Tonsillectomy and adenoidectomy  ?1946  . Cholecystectomy  1970's  . Vaginal hysterectomy  1970  . Dilation and curettage of uterus  1960's  . Skin cancer excision       Allergies:  Allergies  Allergen Reactions  .  Benazepril Hcl Anaphylaxis and Cough  . Celecoxib     REACTION: aching  . Lisinopril     REACTION: cough  . Loratadine     anaphylaxis  . Allegra [Fexofenadine Hcl]     Severe back pain  . Sulfa Antibiotics Hives     Home Medications No current facility-administered medications for this encounter.     Family History  Problem Relation Age of Onset  . Breast cancer Sister   . Emphysema Father     smoked  . Allergies Sister   . Allergies Brother   . Heart disease Mother   . Heart disease Father      History   Social History  . Marital Status: Married    Spouse Name: N/A    Number of Children: N/A  . Years of Education: N/A   Occupational History  . Retired    Social History Main Topics  . Smoking status: Never Smoker   . Smokeless tobacco: Never Used  . Alcohol Use: No  . Drug Use: No  . Sexual Activity: Yes   Other Topics Concern  . Not on file   Social History Narrative  . No narrative on file     Review  of Systems: General: negative for chills, fever, night sweats or weight changes.  Cardiovascular: negative for chest pain, dyspnea on exertion, edema, orthopnea, palpitations, paroxysmal nocturnal dyspnea or shortness of breath Dermatological: negative for rash Respiratory: negative for cough or wheezing Urologic: negative for hematuria Abdominal: negative for nausea, vomiting, diarrhea, bright red blood per rectum, melena, or hematemesis Neurologic: negative for visual changes, syncope, or dizziness All other systems reviewed and are otherwise negative except as noted above.  Physical Exam: Blood pressure 178/92, pulse 51, temperature 98.1 F (36.7 C), temperature source Oral, resp. rate 16, SpO2 98.00%.  General appearance: alert, cooperative, no distress and moderately obese Neck: no carotid bruit and no JVD Lungs: clear to auscultation bilaterally Heart: regular rate and rhythm Abdomen: RUQ surgical scar, moderately obese Extremities:  extremities normal, atraumatic, no cyanosis or edema Pulses: 2+ and symmetric Skin: Skin color, texture, turgor normal. No rashes or lesions Neurologic: Grossly normal    Labs:   Results for orders placed during the hospital encounter of 12/12/13 (from the past 24 hour(s))  TROPONIN I     Status: None   Collection Time    12/12/13  9:30 AM      Result Value Range   Troponin I <0.30  <0.30 ng/mL  BASIC METABOLIC PANEL     Status: Abnormal   Collection Time    12/12/13  9:30 AM      Result Value Range   Sodium 140  137 - 147 mEq/L   Potassium 4.2  3.7 - 5.3 mEq/L   Chloride 100  96 - 112 mEq/L   CO2 26  19 - 32 mEq/L   Glucose, Bld 155 (*) 70 - 99 mg/dL   BUN 30 (*) 6 - 23 mg/dL   Creatinine, Ser 1.00  0.50 - 1.10 mg/dL   Calcium 9.3  8.4 - 10.5 mg/dL   GFR calc non Af Amer 54 (*) >90 mL/min   GFR calc Af Amer 62 (*) >90 mL/min  CBC WITH DIFFERENTIAL     Status: None   Collection Time    12/12/13  9:30 AM      Result Value Range   WBC 7.9  4.0 - 10.5 K/uL   RBC 4.70  3.87 - 5.11 MIL/uL   Hemoglobin 13.5  12.0 - 15.0 g/dL   HCT 41.8  36.0 - 46.0 %   MCV 88.9  78.0 - 100.0 fL   MCH 28.7  26.0 - 34.0 pg   MCHC 32.3  30.0 - 36.0 g/dL   RDW 14.8  11.5 - 15.5 %   Platelets 267  150 - 400 K/uL   Neutrophils Relative % 69  43 - 77 %   Neutro Abs 5.5  1.7 - 7.7 K/uL   Lymphocytes Relative 22  12 - 46 %   Lymphs Abs 1.7  0.7 - 4.0 K/uL   Monocytes Relative 6  3 - 12 %   Monocytes Absolute 0.5  0.1 - 1.0 K/uL   Eosinophils Relative 3  0 - 5 %   Eosinophils Absolute 0.2  0.0 - 0.7 K/uL   Basophils Relative 1  0 - 1 %   Basophils Absolute 0.0  0.0 - 0.1 K/uL     Radiology/Studies: Nm Pet Image Initial (pi) Skull Base To Thigh  11/23/2013   CLINICAL DATA:  Initial treatment strategy for left lower lobe pulmonary nodule.  EXAM: NUCLEAR MEDICINE PET SKULL BASE TO THIGH  FASTING BLOOD GLUCOSE:  Value: 99mg /dl  TECHNIQUE: 18.9  mCi F-18 FDG was injected intravenously. CT data was  obtained and used for attenuation correction and anatomic localization only. (This was not acquired as a diagnostic CT examination.) Additional exam technical data entered on technologist worksheet.  COMPARISON:  CT CHEST W/CM dated 11/05/2013; DG CHEST 2 VIEW dated 11/02/2013  FINDINGS: NECK  No hypermetabolic cervical lymph nodes are identified.There are no lesions of the pharyngeal mucosal space.  CHEST  The sub solid nodule in the superior segment of the left lower lobe demonstrates metabolic activity which is only slightly greater than background. SUV max is 1.8. This nodule measures 2.1 x 1.6 cm on image number 80. No hypermetabolic pulmonary nodules are demonstrated. There is no hypermetabolic mediastinal or hilar nodal activity.  ABDOMEN/PELVIS  There is no hypermetabolic activity within the liver, adrenal glands, spleen or pancreas. There is no hypermetabolic nodal activity. There is nonspecific low level activity associate with the stomach and sigmoid colon, the latter demonstrating mild diverticulosis. Patient's by is status post cholecystectomy and partial hysterectomy.  SKELETON  There is no hypermetabolic activity to suggest osseous metastatic disease.  IMPRESSION: 1. Dominant sub solid left lower lobe nodule does not demonstrate significantly increased metabolic activity. However, morphologically, adenocarcinoma is still a concern. Thoracic surgery consultation is recommended. These recommendations are taken from "Recommendations for the Management of Subsolid Pulmonary Nodules Detected at CT: A Statement from the [place]" Radiology 2013; 266:1, 304-317. 2. No evidence of metastatic disease.   Electronically Signed   By: Camie Patience M.D.   On: 11/23/2013 10:30    EKG:NSR no acute changes  ASSESSMENT AND PLAN:  Principal Problem:   Chest pain with moderate risk of acute coronary syndrome Active Problems:   HYPERTENSION   Renal artery atherosclerosis, s/p LRA stent 11/08/13   Lung  nodule- she is for VATS 12/14/13   Hyperlipidemia   Family history of coronary artery bypass surgery   PLAN: MD to see.    Patient examined chart reviewed.  Exam benign multiple CRF;s and vascular disease with RAS Need to clear for VATS on W[place].  [place] cath.  If no CAD can keep in hospital for  Surgical procedure.  If she has CAD can theoretically have stent and consider surgery for isolated LLL nodule in 6 months.  Risks discussed She has had angio for RAS and understands procedure Willing to proceed Orders done and on cath board.  Continue meds for HTN  Can consider angio renals during cath to assess stent patency on right since BP still high   Jenkins Rouge

## 2013-12-12 NOTE — ED Notes (Signed)
MD at bedside. 

## 2013-12-13 ENCOUNTER — Encounter (HOSPITAL_COMMUNITY)
Admission: EM | Disposition: A | Discharge status: Home / Self Care | Payer: Self-pay | Source: Home / Self Care | Attending: Cardiovascular Disease

## 2013-12-13 ENCOUNTER — Inpatient Hospital Stay (HOSPITAL_COMMUNITY): Admission: RE | Admit: 2013-12-13 | Payer: Medicare Other | Source: Ambulatory Visit

## 2013-12-13 DIAGNOSIS — I739 Peripheral vascular disease, unspecified: Secondary | ICD-10-CM

## 2013-12-13 DIAGNOSIS — I10 Essential (primary) hypertension: Secondary | ICD-10-CM | POA: Diagnosis not present

## 2013-12-13 DIAGNOSIS — I2510 Atherosclerotic heart disease of native coronary artery without angina pectoris: Secondary | ICD-10-CM | POA: Diagnosis not present

## 2013-12-13 DIAGNOSIS — E785 Hyperlipidemia, unspecified: Secondary | ICD-10-CM

## 2013-12-13 DIAGNOSIS — R079 Chest pain, unspecified: Secondary | ICD-10-CM

## 2013-12-13 DIAGNOSIS — I150 Renovascular hypertension: Secondary | ICD-10-CM | POA: Diagnosis not present

## 2013-12-13 HISTORY — PX: LEFT HEART CATHETERIZATION WITH CORONARY ANGIOGRAM: SHX5451

## 2013-12-13 LAB — PROTIME-INR
INR: 1.06 (ref 0.00–1.49)
Prothrombin Time: 13.6 s (ref 11.6–15.2)

## 2013-12-13 LAB — BASIC METABOLIC PANEL WITH GFR
BUN: 29 mg/dL — ABNORMAL HIGH (ref 6–23)
CO2: 29 meq/L (ref 19–32)
Calcium: 9.4 mg/dL (ref 8.4–10.5)
Chloride: 102 meq/L (ref 96–112)
Creatinine, Ser: 1.05 mg/dL (ref 0.50–1.10)
GFR calc Af Amer: 59 mL/min — ABNORMAL LOW
GFR calc non Af Amer: 51 mL/min — ABNORMAL LOW
Glucose, Bld: 94 mg/dL (ref 70–99)
Potassium: 4.4 meq/L (ref 3.7–5.3)
Sodium: 144 meq/L (ref 137–147)

## 2013-12-13 SURGERY — LEFT HEART CATHETERIZATION WITH CORONARY ANGIOGRAM
Anesthesia: LOCAL

## 2013-12-13 MED ORDER — SODIUM CHLORIDE 0.9 % IV SOLN
INTRAVENOUS | Status: DC
  Administered 2013-12-13: 10:00:00 via INTRAVENOUS

## 2013-12-13 MED ORDER — SODIUM CHLORIDE 0.9 % IV SOLN: 250.0000 mL | INTRAVENOUS | Status: DC | PRN

## 2013-12-13 MED ORDER — ASPIRIN 81 MG PO CHEW
81.0000 mg | CHEWABLE_TABLET | ORAL | Status: AC
  Administered 2013-12-13: 81 mg via ORAL
  Filled 2013-12-13: qty 1

## 2013-12-13 MED ORDER — SODIUM CHLORIDE 0.9 % IJ SOLN: 3.0000 mL | INTRAMUSCULAR | Status: DC | PRN

## 2013-12-13 MED ORDER — SODIUM CHLORIDE 0.9 % IV SOLN
INTRAVENOUS | Status: AC
  Administered 2013-12-13: 13:00:00 via INTRAVENOUS

## 2013-12-13 MED ORDER — DIAZEPAM 5 MG PO TABS
5.0000 mg | ORAL_TABLET | ORAL | Status: AC
  Administered 2013-12-13: 5 mg via ORAL
  Filled 2013-12-13: qty 1

## 2013-12-13 MED ORDER — SODIUM CHLORIDE 0.9 % IJ SOLN
3.0000 mL | Freq: Two times a day (BID) | INTRAMUSCULAR | Status: DC
Start: 2013-12-13 — End: 2013-12-13
  Administered 2013-12-13 (×2): 3 mL via INTRAVENOUS

## 2013-12-13 MED ORDER — HEPARIN (PORCINE) IN NACL 2-0.9 UNIT/ML-% IJ SOLN
INTRAMUSCULAR | Status: AC
  Filled 2013-12-13: qty 1000

## 2013-12-13 MED ORDER — HEPARIN SODIUM (PORCINE) 1000 UNIT/ML IJ SOLN
INTRAMUSCULAR | Status: AC
  Filled 2013-12-13: qty 1

## 2013-12-13 MED ORDER — VERAPAMIL HCL 2.5 MG/ML IV SOLN
INTRAVENOUS | Status: AC
  Filled 2013-12-13: qty 2

## 2013-12-13 MED ORDER — MIDAZOLAM HCL 2 MG/2ML IJ SOLN
INTRAMUSCULAR | Status: AC
  Filled 2013-12-13: qty 2

## 2013-12-13 MED ORDER — LIDOCAINE HCL (PF) 1 % IJ SOLN
INTRAMUSCULAR | Status: AC
Start: 2013-12-13 — End: 2013-12-13
  Filled 2013-12-13: qty 30

## 2013-12-13 MED ORDER — FENTANYL CITRATE 0.05 MG/ML IJ SOLN
INTRAMUSCULAR | Status: AC
  Filled 2013-12-13: qty 2

## 2013-12-13 MED ORDER — NITROGLYCERIN 0.2 MG/ML ON CALL CATH LAB
INTRAVENOUS | Status: AC
  Filled 2013-12-13: qty 1

## 2013-12-13 NOTE — Interval H&P Note (Signed)
History and Physical Interval Note:  12/13/2013 11:28 AM  Susan Pittman  has presented today for cardiac cath with the diagnosis of chest pain. The various methods of treatment have been discussed with the patient and family. After consideration of risks, benefits and other options for treatment, the patient has consented to  Procedure(s): LEFT HEART CATHETERIZATION WITH CORONARY ANGIOGRAM (N/A) as a surgical intervention .  The patient's history has been reviewed, patient examined, no change in status, stable for surgery.  I have reviewed the patient's chart and labs.  Questions were answered to the patient's satisfaction.    Cath Lab Visit (complete for each Cath Lab visit)  Clinical Evaluation Leading to the Procedure:   ACS: no  Non-ACS:    Anginal Classification: CCS II  Anti-ischemic medical therapy: Maximal Therapy (2 or more classes of medications)  Non-Invasive Test Results: No non-invasive testing performed  Prior CABG: No previous CABG         Susan Pittman

## 2013-12-13 NOTE — Progress Notes (Signed)
UR Completed Dorri Ozturk Graves-Bigelow, RN,BSN [phone]  

## 2013-12-13 NOTE — Progress Notes (Signed)
Subjective: No CP  NO SOB Objective: Filed Vitals:   12/12/13 1208 12/12/13 2030 12/12/13 2100 12/13/13 0500  BP: 178/92 146/78 153/70 152/46  Pulse: 51 51 55 48  Temp: 98.1 F (36.7 C) 98 F (36.7 C) 97.9 F (36.6 C) 97.9 F (36.6 C)  TempSrc: Oral Oral    Resp: 16 18 18 16   SpO2: 98% 96% 98% 96%   Weight change:   Intake/Output Summary (Last 24 hours) at 12/13/13 1287 Last data filed at 12/12/13 2100  Gross per 24 hour  Intake    120 ml  Output      0 ml  Net    120 ml    General: Alert, awake, oriented x3, in no acute distress Neck:  JVP is normal Heart: Regular rate and rhythm, without murmurs, rubs, gallops.  Lungs: Clear to auscultation.  No rales or wheezes. Exemities:  No edema.   Neuro: Grossly intact, nonfocal.  Tele:  SR Lab Results: Results for orders placed during the hospital encounter of 12/12/13 (from the past 24 hour(s))  TROPONIN I     Status: None   Collection Time    12/12/13  9:30 AM      Result Value Range   Troponin I <0.30  <0.30 ng/mL  BASIC METABOLIC PANEL     Status: Abnormal   Collection Time    12/12/13  9:30 AM      Result Value Range   Sodium 140  137 - 147 mEq/L   Potassium 4.2  3.7 - 5.3 mEq/L   Chloride 100  96 - 112 mEq/L   CO2 26  19 - 32 mEq/L   Glucose, Bld 155 (*) 70 - 99 mg/dL   BUN 30 (*) 6 - 23 mg/dL   Creatinine, Ser 1.00  0.50 - 1.10 mg/dL   Calcium 9.3  8.4 - 10.5 mg/dL   GFR calc non Af Amer 54 (*) >90 mL/min   GFR calc Af Amer 62 (*) >90 mL/min  CBC WITH DIFFERENTIAL     Status: None   Collection Time    12/12/13  9:30 AM      Result Value Range   WBC 7.9  4.0 - 10.5 K/uL   RBC 4.70  3.87 - 5.11 MIL/uL   Hemoglobin 13.5  12.0 - 15.0 g/dL   HCT 41.8  36.0 - 46.0 %   MCV 88.9  78.0 - 100.0 fL   MCH 28.7  26.0 - 34.0 pg   MCHC 32.3  30.0 - 36.0 g/dL   RDW 14.8  11.5 - 15.5 %   Platelets 267  150 - 400 K/uL   Neutrophils Relative % 69  43 - 77 %   Neutro Abs 5.5  1.7 - 7.7 K/uL   Lymphocytes Relative 22   12 - 46 %   Lymphs Abs 1.7  0.7 - 4.0 K/uL   Monocytes Relative 6  3 - 12 %   Monocytes Absolute 0.5  0.1 - 1.0 K/uL   Eosinophils Relative 3  0 - 5 %   Eosinophils Absolute 0.2  0.0 - 0.7 K/uL   Basophils Relative 1  0 - 1 %   Basophils Absolute 0.0  0.0 - 0.1 K/uL  TROPONIN I     Status: None   Collection Time    12/12/13  2:50 PM      Result Value Range   Troponin I <0.30  <0.30 ng/mL  TROPONIN I     Status: None  Collection Time    12/12/13  8:37 PM      Result Value Range   Troponin I <0.30  <0.30 ng/mL  BASIC METABOLIC PANEL     Status: Abnormal   Collection Time    12/13/13  6:13 AM      Result Value Range   Sodium 144  137 - 147 mEq/L   Potassium 4.4  3.7 - 5.3 mEq/L   Chloride 102  96 - 112 mEq/L   CO2 29  19 - 32 mEq/L   Glucose, Bld 94  70 - 99 mg/dL   BUN 29 (*) 6 - 23 mg/dL   Creatinine, Ser 1.05  0.50 - 1.10 mg/dL   Calcium 9.4  8.4 - 10.5 mg/dL   GFR calc non Af Amer 51 (*) >90 mL/min   GFR calc Af Amer 59 (*) >90 mL/min    Studies/Results: @RISRSLT24 @  Medications:  Reviewed   @PROBHOSP @  1.  CP   R/O for MI  Plan for cath today.    2.  Pulm  For VATS on 2/11  3.  HTN  Follow  BP has been high  PN sugg poss look at RA stent.    4 . RAS s/p stent 11/08/13  See above.     LOS: 1 day   Dorris Carnes 12/13/2013, 9:09 AM

## 2013-12-13 NOTE — H&P (View-Only) (Signed)
Subjective: No CP  NO SOB Objective: Filed Vitals:   12/12/13 1208 12/12/13 2030 12/12/13 2100 12/13/13 0500  BP: 178/92 146/78 153/70 152/46  Pulse: 51 51 55 48  Temp: 98.1 F (36.7 C) 98 F (36.7 C) 97.9 F (36.6 C) 97.9 F (36.6 C)  TempSrc: Oral Oral    Resp: 16 18 18 16   SpO2: 98% 96% 98% 96%   Weight change:   Intake/Output Summary (Last 24 hours) at 12/13/13 2831 Last data filed at 12/12/13 2100  Gross per 24 hour  Intake    120 ml  Output      0 ml  Net    120 ml    General: Alert, awake, oriented x3, in no acute distress Neck:  JVP is normal Heart: Regular rate and rhythm, without murmurs, rubs, gallops.  Lungs: Clear to auscultation.  No rales or wheezes. Exemities:  No edema.   Neuro: Grossly intact, nonfocal.  Tele:  SR Lab Results: Results for orders placed during the hospital encounter of 12/12/13 (from the past 24 hour(s))  TROPONIN I     Status: None   Collection Time    12/12/13  9:30 AM      Result Value Range   Troponin I <0.30  <0.30 ng/mL  BASIC METABOLIC PANEL     Status: Abnormal   Collection Time    12/12/13  9:30 AM      Result Value Range   Sodium 140  137 - 147 mEq/L   Potassium 4.2  3.7 - 5.3 mEq/L   Chloride 100  96 - 112 mEq/L   CO2 26  19 - 32 mEq/L   Glucose, Bld 155 (*) 70 - 99 mg/dL   BUN 30 (*) 6 - 23 mg/dL   Creatinine, Ser 1.00  0.50 - 1.10 mg/dL   Calcium 9.3  8.4 - 10.5 mg/dL   GFR calc non Af Amer 54 (*) >90 mL/min   GFR calc Af Amer 62 (*) >90 mL/min  CBC WITH DIFFERENTIAL     Status: None   Collection Time    12/12/13  9:30 AM      Result Value Range   WBC 7.9  4.0 - 10.5 K/uL   RBC 4.70  3.87 - 5.11 MIL/uL   Hemoglobin 13.5  12.0 - 15.0 g/dL   HCT 41.8  36.0 - 46.0 %   MCV 88.9  78.0 - 100.0 fL   MCH 28.7  26.0 - 34.0 pg   MCHC 32.3  30.0 - 36.0 g/dL   RDW 14.8  11.5 - 15.5 %   Platelets 267  150 - 400 K/uL   Neutrophils Relative % 69  43 - 77 %   Neutro Abs 5.5  1.7 - 7.7 K/uL   Lymphocytes Relative 22   12 - 46 %   Lymphs Abs 1.7  0.7 - 4.0 K/uL   Monocytes Relative 6  3 - 12 %   Monocytes Absolute 0.5  0.1 - 1.0 K/uL   Eosinophils Relative 3  0 - 5 %   Eosinophils Absolute 0.2  0.0 - 0.7 K/uL   Basophils Relative 1  0 - 1 %   Basophils Absolute 0.0  0.0 - 0.1 K/uL  TROPONIN I     Status: None   Collection Time    12/12/13  2:50 PM      Result Value Range   Troponin I <0.30  <0.30 ng/mL  TROPONIN I     Status: None  Collection Time    12/12/13  8:37 PM      Result Value Range   Troponin I <0.30  <0.30 ng/mL  BASIC METABOLIC PANEL     Status: Abnormal   Collection Time    12/13/13  6:13 AM      Result Value Range   Sodium 144  137 - 147 mEq/L   Potassium 4.4  3.7 - 5.3 mEq/L   Chloride 102  96 - 112 mEq/L   CO2 29  19 - 32 mEq/L   Glucose, Bld 94  70 - 99 mg/dL   BUN 29 (*) 6 - 23 mg/dL   Creatinine, Ser 1.05  0.50 - 1.10 mg/dL   Calcium 9.4  8.4 - 10.5 mg/dL   GFR calc non Af Amer 51 (*) >90 mL/min   GFR calc Af Amer 59 (*) >90 mL/min    Studies/Results: @RISRSLT24 @  Medications:  Reviewed   @PROBHOSP @  1.  CP   R/O for MI  Plan for cath today.    2.  Pulm  For VATS on 2/11  3.  HTN  Follow  BP has been high  PN sugg poss look at RA stent.    4 . RAS s/p stent 11/08/13  See above.     LOS: 1 day   Susan Pittman 12/13/2013, 9:09 AM

## 2013-12-13 NOTE — CV Procedure (Addendum)
      Cardiac Catheterization Operative Report  Susan Pittman [id number] 2/9/201512:10 PM [name], MD  Procedure Performed:  1. Left Heart Catheterization 2. Selective Coronary Angiography 3. Left ventricular angiogram 4. Distal aortogram/non-selective renal artery angiogram  Operator: Lauree Chandler, MD  Arterial access site:  Right radial artery.   Indication: 73 yo female with history of renal artery stenosis, HTN, HLD admitted with chest pain concerning for angina. Pt with lung mass and need for VATS this week. She was admitted by Dr. Johnsie Cancel and plans made for cath for definitive exclusion of CAD.                                    Procedure Details: The risks, benefits, complications, treatment options, and expected outcomes were discussed with the patient. The patient and/or family concurred with the proposed plan, giving informed consent. The patient was brought to the cath lab after IV hydration was begun and oral premedication was given. The patient was further sedated with Versed and Fentanyl. The right wrist was assessed with an Allens test which was positive. The right wrist was prepped and draped in a sterile fashion. 1% lidocaine was used for local anesthesia. Using the modified Seldinger access technique, a 5 French sheath was placed in the right radial artery. 3 mg Verapamil was given through the sheath. 5000 units IV heparin was given. Standard diagnostic catheters were used to perform selective coronary angiography. A pigtail catheter was used to perform a left ventricular angiogram. The pigtail was then placed in the distal aorta and a distal aortogram was performed for non-selective evaluation of the renal arteries. The sheath was removed from the right radial artery and a Terumo hemostasis band was applied at the arteriotomy site on the right wrist.   There were no immediate complications. The patient was taken to the recovery area in stable condition.     Hemodynamic Findings: Central aortic pressure: 155/63 Left ventricular pressure: 156/6/13  Angiographic Findings:  Left main: No obstructive disease.   Left Anterior Descending Artery: Large caliber vessel that courses to the apex. There are several small caliber diagonal branches. No obstructive disease noted.   Circumflex Artery: Moderate caliber vessel with moderate caliber bifurcating OM branch. No obstructive disease.   Right Coronary Artery: Large dominant vessel with 20% smooth stenosis in the mid vessel.   Left Ventricular Angiogram: LVEF=65%.   Distal aortogram: Mild plaque distal aorta. Right renal artery stent is patent. The left renal artery has no significant stenosis.   Impression: 1. Mild non-obstructive CAD 2. Normal LV systolic function 3. Patent right renal artery stent without restenosis  Recommendations: Continue medical management.        Complications:  None. The patient tolerated the procedure well.

## 2013-12-14 ENCOUNTER — Encounter (HOSPITAL_COMMUNITY): Payer: Self-pay | Admitting: *Deleted

## 2013-12-14 ENCOUNTER — Inpatient Hospital Stay (HOSPITAL_COMMUNITY): Payer: Medicare Other

## 2013-12-14 DIAGNOSIS — E785 Hyperlipidemia, unspecified: Secondary | ICD-10-CM | POA: Diagnosis not present

## 2013-12-14 DIAGNOSIS — R222 Localized swelling, mass and lump, trunk: Secondary | ICD-10-CM | POA: Diagnosis not present

## 2013-12-14 DIAGNOSIS — I701 Atherosclerosis of renal artery: Secondary | ICD-10-CM | POA: Diagnosis not present

## 2013-12-14 DIAGNOSIS — R079 Chest pain, unspecified: Secondary | ICD-10-CM | POA: Diagnosis not present

## 2013-12-14 DIAGNOSIS — E039 Hypothyroidism, unspecified: Secondary | ICD-10-CM

## 2013-12-14 DIAGNOSIS — C4490 Unspecified malignant neoplasm of skin, unspecified: Secondary | ICD-10-CM

## 2013-12-14 LAB — COMPREHENSIVE METABOLIC PANEL
ALT: 17 U/L (ref 0–35)
AST: 17 U/L (ref 0–37)
Albumin: 3.6 g/dL (ref 3.5–5.2)
Alkaline Phosphatase: 91 U/L (ref 39–117)
BUN: 32 mg/dL — ABNORMAL HIGH (ref 6–23)
CO2: 25 mEq/L (ref 19–32)
Calcium: 9.4 mg/dL (ref 8.4–10.5)
Chloride: 102 mEq/L (ref 96–112)
Creatinine, Ser: 1.09 mg/dL (ref 0.50–1.10)
GFR calc Af Amer: 56 mL/min — ABNORMAL LOW (ref >90)
GFR calc non Af Amer: 48 mL/min — ABNORMAL LOW (ref >90)
Glucose, Bld: 117 mg/dL — ABNORMAL HIGH (ref 70–99)
Potassium: 4.8 mEq/L (ref 3.7–5.3)
Sodium: 141 mEq/L (ref 137–147)
Total Bilirubin: 0.6 mg/dL (ref 0.3–1.2)
Total Protein: 7.2 g/dL (ref 6.0–8.3)

## 2013-12-14 LAB — BLOOD GAS, ARTERIAL
Acid-Base Excess: 0.2 mmol/L (ref 0.0–2.0)
Bicarbonate: 24.3 mEq/L — ABNORMAL HIGH (ref 20.0–24.0)
Drawn by: 222511
FIO2: 0.21 %
O2 Saturation: 96.3 %
Patient temperature: 98.6
TCO2: 25.5 mmol/L (ref 0–100)
pCO2 arterial: 39.1 mmHg (ref 35.0–45.0)
pH, Arterial: 7.41 (ref 7.350–7.450)
pO2, Arterial: 84.5 mmHg (ref 80.0–100.0)

## 2013-12-14 LAB — ABO/RH: ABO/RH(D): O POS

## 2013-12-14 LAB — TYPE AND SCREEN
ABO/RH(D): O POS
Antibody Screen: NEGATIVE

## 2013-12-14 LAB — APTT: aPTT: 32 seconds (ref 24–37)

## 2013-12-14 MED ORDER — DEXTROSE 5 % IV SOLN
1.5000 g | INTRAVENOUS | Status: AC
  Administered 2013-12-15 (×2): 1.5 g via INTRAVENOUS
  Filled 2013-12-14: qty 1.5

## 2013-12-14 NOTE — Discharge Summary (Signed)
Agree with plans as noted.

## 2013-12-14 NOTE — Discharge Instructions (Signed)

## 2013-12-14 NOTE — Discharge Summary (Signed)
Physician Discharge Summary  Patient ID: Susan Pittman MRN: [id number] DOB/AGE: 75/16/1942 75 y.o.  Admit date: 12/12/2013 Discharge date: 12/14/2013  Admission Diagnoses: Chest Pain with a Moderate Risk for ACS  Discharge Diagnoses:  Principal Problem:   Chest pain with moderate risk of acute coronary syndrome Active Problems:   HYPERTENSION   Hyperlipidemia   Renal artery atherosclerosis, s/p LRA stent 11/08/13   Lung nodule- she is for VATS 12/14/13   Family history of coronary artery bypass surgery   Discharged Condition: stable  Hospital Course: The patient is a 73 y/o female followed by Dr Claiborne Billings. She has renovascular HTN nad had a Lt renal artery stent placed on 11/08/13. She has no history of CAD, and in fact, had a remote cath that showed no significant CAD. During her admission for her renal stent, a lung nodule was found. She has no history of smoking but there is concern this could be lung CA. She is scheduled for VATS 12/15/13. Her ASA and Plavix have been stopped.   She presented to [facility] on 12/13/13 with a complaint of some chest pain that was unusual for her. She described a sharp fleeting pain but later on she admited she has had some "pressure" and tightness" between her shoulder blades. She denies any radiation, SOB, or diaphoresis. She initially presented to the [facility] ER but was transferred to [facility].  On arrival, she was pain free. Plans were made for cath for definitive exclusion of CAD. The LHC was performed by Dr. Angelena Form, via the right radial artery. She was found to have mild non-obstructive CAD. She had normal LV systolic function. The right renal artery stent was patent and w/o stenosis. Continued medical therapy was recommended. She left the cath lab in stable condition. She was kept overnight for observation and hydration. She had no post cath complications. The right radial access site remained stable, as did renal function. She was last seen and examined by Dr. Harrington Challenger,  who determined she was stable for discharge home. She will return to [facility] tomorrow for her scheduled VATS for her isolated LLL nodule, to be performed by Dr. Servando Snare. She will need to resume ASA + Plavix, once cleared by the surgeon. She will have post-hospital f/u with Dr. Claiborne Billings in clinic. Dr. Harrington Challenger has recommended that she get a lipid panel when she returns to clinic.    Consults: None  Significant Diagnostic Studies:   LHC 12/13/13 Hemodynamic Findings:  Central aortic pressure: 155/63  Left ventricular pressure: 156/6/13  Angiographic Findings:  Left main: No obstructive disease.  Left Anterior Descending Artery: Large caliber vessel that courses to the apex. There are several small caliber diagonal branches. No obstructive disease noted.  Circumflex Artery: Moderate caliber vessel with moderate caliber bifurcating OM branch. No obstructive disease.  Right Coronary Artery: Large dominant vessel with 20% smooth stenosis in the mid vessel.  Left Ventricular Angiogram: LVEF=65%.  Distal aortogram: Mild plaque distal aorta. Right renal artery stent is patent. The left renal artery has no significant stenosis.  Impression:  1. Mild non-obstructive CAD  2. Normal LV systolic function  3. Patent right renal artery stent without restenosis   Treatments: See Hospital Course  Discharge Exam: Blood pressure 122/67, pulse 53, temperature 97.9 F (36.6 C), temperature source Oral, resp. rate 18, SpO2 98.00%.   Disposition: 01-Home or Self Care      Discharge Orders   Future Appointments Provider Department Dept Phone   01/03/2014 2:10 PM Tommy Medal, [place]  [facility] [phone]   03/03/2014 1:30 PM Troy Sine, MD [facility] [phone]   03/16/2014 9:00 AM [name], MD [place] [facility]  [place]   Future Orders Complete By Expires   Diet - low sodium heart healthy  As directed    Increase activity slowly  As  directed        Medication List    STOP taking these medications       aspirin EC 81 MG tablet      TAKE these medications       albuterol 108 (90 BASE) MCG/ACT inhaler  Commonly known as:  PROVENTIL HFA;VENTOLIN HFA  Inhale 1-2 puffs into the lungs every 6 (six) hours as needed for wheezing or shortness of breath.     amLODipine 10 MG tablet  Commonly known as:  NORVASC  Take 10 mg by mouth daily.     BYSTOLIC 20 MG Tabs  Generic drug:  Nebivolol HCl  Take 20 mg by mouth every morning.     Calcium 600-200 MG-UNIT per tablet  Take 1 tablet by mouth 2 (two) times daily. Takes at [employer] and Evening     etodolac 400 MG tablet  Commonly known as:  LODINE  Take 400 mg by mouth 2 (two) times daily.     fluticasone 50 MCG/ACT nasal spray  Commonly known as:  FLONASE  Place 2 sprays into both nostrils daily as needed for allergies or rhinitis.     lansoprazole 15 MG capsule  Commonly known as:  PREVACID  Take 15 mg by mouth daily.     levothyroxine 88 MCG tablet  Commonly known as:  SYNTHROID, LEVOTHROID  Take 88 mcg by mouth daily before breakfast.     MULTI FOR HER 50+ PO  Take 1 tablet by mouth daily.     valsartan-hydrochlorothiazide 320-25 MG per tablet  Commonly known as:  DIOVAN-HCT  Take 1 tablet by mouth daily.     ZYRTEC 10 MG tablet  Generic drug:  cetirizine  Take 10 mg by mouth at bedtime.       Follow-up Information   Follow up with [facility]. (our office will call you with an appoitment)    Contact information:   [address] [place] [address] [place]: >30 MINUTES Signed: Lyda Jester 12/14/2013, 12:41 PM

## 2013-12-14 NOTE — Progress Notes (Signed)
Subjective: No more scapular/chest pain. W/o complaints.   Objective: Vital signs in last 24 hours: Temp:  [97.8 F (36.6 C)-97.9 F (36.6 C)] 97.9 F (36.6 C) (02/10 0600) Pulse Rate:  [48-58] 53 (02/10 0600) Resp:  [14-18] 18 (02/10 0600) BP: (122-170)/(43-135) 122/67 mmHg (02/10 0600) SpO2:  [98 %-100 %] 98 % (02/10 0600) Last BM Date: 12/13/13  Intake/Output from previous day: 02/09 0701 - 02/10 0700 In: 356.3 [I.V.:356.3] Out: -  Intake/Output this shift:    Medications Current Facility-Administered Medications  Medication Dose Route Frequency Provider Last Rate Last Dose  . acetaminophen (TYLENOL) tablet 650 mg  650 mg Oral Q4H PRN Erlene Quan, PA-C      . ALPRAZolam Duanne Moron) tablet 0.25 mg  0.25 mg Oral BID PRN Erlene Quan, PA-C      . amLODipine (NORVASC) tablet 10 mg  10 mg Oral Daily Erlene Quan, PA-C   10 mg at 12/13/13 0867  . calcium-vitamin D (OSCAL WITH D) 500-200 MG-UNIT per tablet 1 tablet  1 tablet Oral BID Josue Hector, MD   1 tablet at 12/13/13 2206  . etodolac (LODINE) tablet 400 mg  400 mg Oral BID Erlene Quan, PA-C   400 mg at 12/13/13 2206  . fluticasone (FLONASE) 50 MCG/ACT nasal spray 2 spray  2 spray Each Nare Daily PRN Erlene Quan, PA-C      . hydrALAZINE (APRESOLINE) tablet 25 mg  25 mg Oral Q6H PRN Erlene Quan, PA-C   25 mg at 12/13/13 1313  . irbesartan (AVAPRO) tablet 300 mg  300 mg Oral Daily Josue Hector, MD   300 mg at 12/13/13 6195   And  . hydrochlorothiazide (HYDRODIURIL) tablet 25 mg  25 mg Oral Daily Josue Hector, MD      . levothyroxine (SYNTHROID, LEVOTHROID) tablet 88 mcg  88 mcg Oral QAC breakfast Erlene Quan, PA-C   88 mcg at 12/13/13 0932  . nebivolol (BYSTOLIC) tablet 20 mg  20 mg Oral Daily Erlene Quan, PA-C   20 mg at 12/13/13 6712  . nitroGLYCERIN (NITROSTAT) SL tablet 0.4 mg  0.4 mg Sublingual Q5 Min x 3 PRN Doreene Burke Kilroy, PA-C      . ondansetron [facility]) injection 4 mg  4 mg Intravenous Q6H PRN Doreene Burke  Kilroy, PA-C      . pantoprazole (PROTONIX) EC tablet 40 mg  40 mg Oral Daily Erlene Quan, PA-C   40 mg at 12/13/13 4580    PE: General appearance: alert, cooperative and no distress Lungs: clear to auscultation bilaterally Heart: irregularly irregular rhythm Extremities: trace bilateral LEE Pulses: 2+ and symmetric Skin: warm and dry Neurologic: Grossly normal  Lab Results:   Recent Labs  12/12/13 0930  WBC 7.9  HGB 13.5  HCT 41.8  PLT 267   BMET  Recent Labs  12/12/13 0930 12/13/13 0613  NA 140 144  K 4.2 4.4  CL 100 102  CO2 26 29  GLUCOSE 155* 94  BUN 30* 29*  CREATININE 1.00 1.05  CALCIUM 9.3 9.4   PT/INR  Recent Labs  12/13/13 0940  LABPROT 13.6  INR 1.06   Studies:  LHC 12/13/13 Angiographic Findings:  Left main: No obstructive disease.  Left Anterior Descending Artery: Large caliber vessel that courses to the apex. There are several small caliber diagonal branches. No obstructive disease noted.  Circumflex Artery: Moderate caliber vessel with moderate caliber bifurcating OM branch. No obstructive disease.  Right Coronary Artery: Large dominant vessel with 20% smooth stenosis in the mid vessel.  Left Ventricular Angiogram: LVEF=65%.  Distal aortogram: Mild plaque distal aorta. Right renal artery stent is patent. The left renal artery has no significant stenosis.  Impression:  1. Mild non-obstructive CAD  2. Normal LV systolic function  3. Patent right renal artery stent without restenosis    Assessment/Plan  Principal Problem:   Chest pain with moderate risk of acute coronary syndrome Active Problems:   HYPERTENSION   Hyperlipidemia   Renal artery atherosclerosis, s/p LRA stent 11/08/13   Lung nodule- she is for VATS 12/14/13   Family history of coronary artery bypass surgery  Plan: Day 1 s/p LHC, in the setting of chest/scapular pain, demonstrating mild non-obstructive CAD. Normal LV systolic function. The right renal artery stent was  patent and w/o restenosis. Right radial access site is stable. Will assess renal function once labs return. BP stable. Telemetry shows sinus brady with PVCs. HR in the upper 50s. Pt is asymptomatic.Ok to proceed with VATS tomorrow for an isolated LLL nodule.  Discharge home today    LOS: 2 days    Brittainy M. Ladoris Gene 12/14/2013 7:49 AM  Patient seen and examined.  I have amended note to reflect my findings  Agree with findings of B Simmons. Plan d/c today.  VATS tomorrow.    Patient should have lipids checked as outpatient when returns to clinic  With x of mild plaquing and RA stent should be on a statin.    Dorris Carnes

## 2013-12-15 ENCOUNTER — Inpatient Hospital Stay (HOSPITAL_COMMUNITY)
Admission: RE | Admit: 2013-12-15 | Discharge: 2013-12-20 | DRG: 164 | Disposition: A | Discharge status: Home / Self Care | Payer: Medicare Other | Source: Ambulatory Visit | Attending: Cardiothoracic Surgery | Admitting: Cardiothoracic Surgery

## 2013-12-15 ENCOUNTER — Inpatient Hospital Stay (HOSPITAL_COMMUNITY): Payer: Medicare Other | Admitting: Certified Registered Nurse Anesthetist

## 2013-12-15 ENCOUNTER — Encounter: Payer: Self-pay | Admitting: Internal Medicine

## 2013-12-15 ENCOUNTER — Inpatient Hospital Stay (HOSPITAL_COMMUNITY): Payer: Medicare Other

## 2013-12-15 ENCOUNTER — Encounter (HOSPITAL_COMMUNITY): Payer: Medicare Other | Admitting: Certified Registered Nurse Anesthetist

## 2013-12-15 ENCOUNTER — Encounter (HOSPITAL_COMMUNITY)
Admission: RE | Disposition: A | Discharge status: Home / Self Care | Payer: Self-pay | Source: Ambulatory Visit | Attending: Cardiothoracic Surgery

## 2013-12-15 ENCOUNTER — Encounter (HOSPITAL_COMMUNITY): Payer: Self-pay | Admitting: Surgery

## 2013-12-15 DIAGNOSIS — I498 Other specified cardiac arrhythmias: Secondary | ICD-10-CM | POA: Diagnosis not present

## 2013-12-15 DIAGNOSIS — Z981 Arthrodesis status: Secondary | ICD-10-CM | POA: Diagnosis not present

## 2013-12-15 DIAGNOSIS — I2510 Atherosclerotic heart disease of native coronary artery without angina pectoris: Secondary | ICD-10-CM | POA: Diagnosis present

## 2013-12-15 DIAGNOSIS — Z803 Family history of malignant neoplasm of breast: Secondary | ICD-10-CM

## 2013-12-15 DIAGNOSIS — I10 Essential (primary) hypertension: Secondary | ICD-10-CM | POA: Diagnosis present

## 2013-12-15 DIAGNOSIS — M1710 Unilateral primary osteoarthritis, unspecified knee: Secondary | ICD-10-CM | POA: Diagnosis present

## 2013-12-15 DIAGNOSIS — Z889 Allergy status to unspecified drugs, medicaments and biological substances status: Secondary | ICD-10-CM | POA: Diagnosis not present

## 2013-12-15 DIAGNOSIS — IMO0002 Reserved for concepts with insufficient information to code with codable children: Secondary | ICD-10-CM

## 2013-12-15 DIAGNOSIS — D72829 Elevated white blood cell count, unspecified: Secondary | ICD-10-CM | POA: Diagnosis not present

## 2013-12-15 DIAGNOSIS — R222 Localized swelling, mass and lump, trunk: Secondary | ICD-10-CM | POA: Diagnosis not present

## 2013-12-15 DIAGNOSIS — J9819 Other pulmonary collapse: Secondary | ICD-10-CM | POA: Diagnosis not present

## 2013-12-15 DIAGNOSIS — Z4682 Encounter for fitting and adjustment of non-vascular catheter: Secondary | ICD-10-CM | POA: Diagnosis not present

## 2013-12-15 DIAGNOSIS — E785 Hyperlipidemia, unspecified: Secondary | ICD-10-CM | POA: Diagnosis present

## 2013-12-15 DIAGNOSIS — M1610 Unilateral primary osteoarthritis, unspecified hip: Secondary | ICD-10-CM | POA: Diagnosis present

## 2013-12-15 DIAGNOSIS — C3430 Malignant neoplasm of lower lobe, unspecified bronchus or lung: Secondary | ICD-10-CM | POA: Diagnosis not present

## 2013-12-15 DIAGNOSIS — J984 Other disorders of lung: Secondary | ICD-10-CM | POA: Diagnosis not present

## 2013-12-15 DIAGNOSIS — J90 Pleural effusion, not elsewhere classified: Secondary | ICD-10-CM | POA: Diagnosis not present

## 2013-12-15 DIAGNOSIS — M19049 Primary osteoarthritis, unspecified hand: Secondary | ICD-10-CM | POA: Diagnosis present

## 2013-12-15 DIAGNOSIS — Z85828 Personal history of other malignant neoplasm of skin: Secondary | ICD-10-CM | POA: Diagnosis not present

## 2013-12-15 DIAGNOSIS — J45909 Unspecified asthma, uncomplicated: Secondary | ICD-10-CM | POA: Diagnosis present

## 2013-12-15 DIAGNOSIS — E039 Hypothyroidism, unspecified: Secondary | ICD-10-CM | POA: Diagnosis present

## 2013-12-15 DIAGNOSIS — K219 Gastro-esophageal reflux disease without esophagitis: Secondary | ICD-10-CM | POA: Diagnosis present

## 2013-12-15 DIAGNOSIS — Z902 Acquired absence of lung [part of]: Secondary | ICD-10-CM

## 2013-12-15 DIAGNOSIS — Z8249 Family history of ischemic heart disease and other diseases of the circulatory system: Secondary | ICD-10-CM

## 2013-12-15 DIAGNOSIS — D62 Acute posthemorrhagic anemia: Secondary | ICD-10-CM | POA: Diagnosis not present

## 2013-12-15 DIAGNOSIS — R911 Solitary pulmonary nodule: Secondary | ICD-10-CM

## 2013-12-15 DIAGNOSIS — M810 Age-related osteoporosis without current pathological fracture: Secondary | ICD-10-CM | POA: Diagnosis present

## 2013-12-15 HISTORY — PX: VIDEO BRONCHOSCOPY: SHX5072

## 2013-12-15 HISTORY — PX: VIDEO ASSISTED THORACOSCOPY (VATS)/WEDGE RESECTION: SHX6174

## 2013-12-15 HISTORY — DX: Solitary pulmonary nodule: R91.1

## 2013-12-15 HISTORY — PX: LOBECTOMY: SHX5089

## 2013-12-15 LAB — URINALYSIS, ROUTINE W REFLEX MICROSCOPIC
Glucose, UA: NEGATIVE mg/dL
Hgb urine dipstick: NEGATIVE
Ketones, ur: 15 mg/dL — AB
Nitrite: NEGATIVE
Protein, ur: NEGATIVE mg/dL
Specific Gravity, Urine: 1.028 (ref 1.005–1.030)
Urobilinogen, UA: 0.2 mg/dL (ref 0.0–1.0)
pH: 5.5 (ref 5.0–8.0)

## 2013-12-15 LAB — URINE MICROSCOPIC-ADD ON

## 2013-12-15 LAB — SURGICAL PCR SCREEN
MRSA, PCR: NEGATIVE
Staphylococcus aureus: NEGATIVE

## 2013-12-15 SURGERY — BRONCHOSCOPY, VIDEO-ASSISTED
Anesthesia: General | Site: Chest

## 2013-12-15 MED ORDER — DIPHENHYDRAMINE HCL 50 MG/ML IJ SOLN
12.5000 mg | Freq: Four times a day (QID) | INTRAMUSCULAR | Status: DC | PRN
  Administered 2013-12-18: 12.5 mg via INTRAVENOUS
  Filled 2013-12-15: qty 1

## 2013-12-15 MED ORDER — MUPIROCIN 2 % EX OINT
TOPICAL_OINTMENT | Freq: Two times a day (BID) | CUTANEOUS | Status: DC
  Administered 2013-12-15: 1 via NASAL
  Administered 2013-12-16: 12:00:00 via NASAL
  Administered 2013-12-16: 1 via NASAL
  Administered 2013-12-17 – 2013-12-19 (×3): via NASAL
  Administered 2013-12-19: 1 via NASAL
  Administered 2013-12-20: 10:00:00 via NASAL
  Filled 2013-12-15 (×2): qty 22

## 2013-12-15 MED ORDER — DEXTROSE-NACL 5-0.45 % IV SOLN
INTRAVENOUS | Status: DC
  Administered 2013-12-15 – 2013-12-16 (×3): via INTRAVENOUS
  Administered 2013-12-18: 20 mL/h via INTRAVENOUS

## 2013-12-15 MED ORDER — POTASSIUM CHLORIDE 10 MEQ/50ML IV SOLN: 10.0000 meq | Freq: Every day | INTRAVENOUS | Status: DC | PRN

## 2013-12-15 MED ORDER — BUPIVACAINE 0.5 % ON-Q PUMP SINGLE CATH 400 ML
400.0000 mL | INJECTION | Status: DC
  Administered 2013-12-15: 400 mL
  Filled 2013-12-15: qty 400

## 2013-12-15 MED ORDER — LIDOCAINE HCL (CARDIAC) 20 MG/ML IV SOLN
INTRAVENOUS | Status: DC | PRN
  Administered 2013-12-15: 100 mg via INTRAVENOUS

## 2013-12-15 MED ORDER — MIDAZOLAM HCL 2 MG/2ML IJ SOLN
INTRAMUSCULAR | Status: AC
  Filled 2013-12-15: qty 2

## 2013-12-15 MED ORDER — 0.9 % SODIUM CHLORIDE (POUR BTL) OPTIME
TOPICAL | Status: DC | PRN
  Administered 2013-12-15: 2000 mL

## 2013-12-15 MED ORDER — NEBIVOLOL HCL 10 MG PO TABS
20.0000 mg | ORAL_TABLET | Freq: Every morning | ORAL | Status: DC
  Administered 2013-12-16 – 2013-12-20 (×5): 20 mg via ORAL
  Filled 2013-12-15 (×5): qty 2

## 2013-12-15 MED ORDER — MEPERIDINE HCL 25 MG/ML IJ SOLN: 6.2500 mg | INTRAMUSCULAR | Status: DC | PRN

## 2013-12-15 MED ORDER — IRBESARTAN 300 MG PO TABS
300.0000 mg | ORAL_TABLET | Freq: Every day | ORAL | Status: DC
  Administered 2013-12-16 – 2013-12-20 (×5): 300 mg via ORAL
  Filled 2013-12-15 (×5): qty 1

## 2013-12-15 MED ORDER — HYDROMORPHONE HCL PF 1 MG/ML IJ SOLN
INTRAMUSCULAR | Status: AC
  Filled 2013-12-15: qty 1

## 2013-12-15 MED ORDER — ETODOLAC 400 MG PO TABS
400.0000 mg | ORAL_TABLET | Freq: Two times a day (BID) | ORAL | Status: DC
  Administered 2013-12-16 – 2013-12-20 (×9): 400 mg via ORAL
  Filled 2013-12-15 (×13): qty 1

## 2013-12-15 MED ORDER — LIDOCAINE HCL (CARDIAC) 20 MG/ML IV SOLN
INTRAVENOUS | Status: AC
  Filled 2013-12-15: qty 5

## 2013-12-15 MED ORDER — FLUTICASONE PROPIONATE 50 MCG/ACT NA SUSP: 2.0000 | Freq: Every day | NASAL | Status: DC | PRN

## 2013-12-15 MED ORDER — VALSARTAN-HYDROCHLOROTHIAZIDE 320-25 MG PO TABS: 1.0000 | ORAL_TABLET | Freq: Every day | ORAL | Status: DC

## 2013-12-15 MED ORDER — LACTATED RINGERS IV SOLN
INTRAVENOUS | Status: DC | PRN
  Administered 2013-12-15 (×2): via INTRAVENOUS

## 2013-12-15 MED ORDER — OXYCODONE-ACETAMINOPHEN 5-325 MG PO TABS
1.0000 | ORAL_TABLET | ORAL | Status: DC | PRN
  Administered 2013-12-19 – 2013-12-20 (×4): 2 via ORAL
  Filled 2013-12-15 (×4): qty 2

## 2013-12-15 MED ORDER — NEOSTIGMINE METHYLSULFATE 1 MG/ML IJ SOLN
INTRAMUSCULAR | Status: DC | PRN
  Administered 2013-12-15: 5 mg via INTRAVENOUS

## 2013-12-15 MED ORDER — PANTOPRAZOLE SODIUM 20 MG PO TBEC
20.0000 mg | DELAYED_RELEASE_TABLET | Freq: Every day | ORAL | Status: DC
  Administered 2013-12-16 – 2013-12-20 (×5): 20 mg via ORAL
  Filled 2013-12-15 (×5): qty 1

## 2013-12-15 MED ORDER — DIPHENHYDRAMINE HCL 12.5 MG/5ML PO ELIX
12.5000 mg | ORAL_SOLUTION | Freq: Four times a day (QID) | ORAL | Status: DC | PRN
Start: 2013-12-15 — End: 2013-12-20
  Filled 2013-12-15: qty 5

## 2013-12-15 MED ORDER — ALBUTEROL SULFATE (2.5 MG/3ML) 0.083% IN NEBU: 2.5000 mg | INHALATION_SOLUTION | Freq: Four times a day (QID) | RESPIRATORY_TRACT | Status: DC | PRN

## 2013-12-15 MED ORDER — ROCURONIUM BROMIDE 100 MG/10ML IV SOLN
INTRAVENOUS | Status: DC | PRN
  Administered 2013-12-15: 10 mg via INTRAVENOUS
  Administered 2013-12-15: 50 mg via INTRAVENOUS
  Administered 2013-12-15: 20 mg via INTRAVENOUS
  Administered 2013-12-15 (×4): 10 mg via INTRAVENOUS

## 2013-12-15 MED ORDER — SODIUM CHLORIDE 0.9 % IJ SOLN
9.0000 mL | INTRAMUSCULAR | Status: DC | PRN
  Administered 2013-12-15: 9 mL via INTRAVENOUS

## 2013-12-15 MED ORDER — FENTANYL CITRATE 0.05 MG/ML IJ SOLN
INTRAMUSCULAR | Status: AC
  Filled 2013-12-15: qty 5

## 2013-12-15 MED ORDER — PROPOFOL 10 MG/ML IV BOLUS
INTRAVENOUS | Status: DC | PRN
  Administered 2013-12-15: 200 mg via INTRAVENOUS

## 2013-12-15 MED ORDER — FENTANYL 10 MCG/ML IV SOLN
INTRAVENOUS | Status: DC
  Administered 2013-12-15: 45 ug via INTRAVENOUS
  Administered 2013-12-15: 230 ug via INTRAVENOUS
  Administered 2013-12-15 – 2013-12-16 (×2): via INTRAVENOUS
  Administered 2013-12-16: 150 ug via INTRAVENOUS
  Administered 2013-12-16: 200 ug via INTRAVENOUS
  Administered 2013-12-16: 60 ug via INTRAVENOUS
  Administered 2013-12-16: 130 ug via INTRAVENOUS
  Administered 2013-12-16: 150 ug/h via INTRAVENOUS
  Administered 2013-12-16: 19:00:00 via INTRAVENOUS
  Administered 2013-12-16: 200 ug via INTRAVENOUS
  Administered 2013-12-16: 310 ug via INTRAVENOUS
  Administered 2013-12-17: 90 ug via INTRAVENOUS
  Administered 2013-12-17: 40 ug via INTRAVENOUS
  Administered 2013-12-17: 22:00:00 via INTRAVENOUS
  Administered 2013-12-17 (×2): 80 ug via INTRAVENOUS
  Administered 2013-12-17: 50 ug via INTRAVENOUS
  Administered 2013-12-18: 140 ug via INTRAVENOUS
  Administered 2013-12-18: 100 ug via INTRAVENOUS
  Administered 2013-12-18: 80 ug via INTRAVENOUS
  Administered 2013-12-18: 140 ug via INTRAVENOUS
  Administered 2013-12-18: 30 ug via INTRAVENOUS
  Administered 2013-12-18: 80 ug via INTRAVENOUS
  Administered 2013-12-19: 30 ug via INTRAVENOUS
  Administered 2013-12-19: 20 ug via INTRAVENOUS
  Administered 2013-12-19: 50 ug via INTRAVENOUS
  Filled 2013-12-15 (×8): qty 50

## 2013-12-15 MED ORDER — GLYCOPYRROLATE 0.2 MG/ML IJ SOLN
INTRAMUSCULAR | Status: AC
  Filled 2013-12-15: qty 5

## 2013-12-15 MED ORDER — ROCURONIUM BROMIDE 50 MG/5ML IV SOLN
INTRAVENOUS | Status: AC
  Filled 2013-12-15: qty 1

## 2013-12-15 MED ORDER — PROPOFOL 10 MG/ML IV BOLUS
INTRAVENOUS | Status: AC
  Filled 2013-12-15: qty 20

## 2013-12-15 MED ORDER — ACETAMINOPHEN 160 MG/5ML PO SOLN: 1000.0000 mg | Freq: Four times a day (QID) | ORAL | Status: AC

## 2013-12-15 MED ORDER — ONDANSETRON HCL 4 MG/2ML IJ SOLN
INTRAMUSCULAR | Status: AC
  Filled 2013-12-15: qty 2

## 2013-12-15 MED ORDER — LEVALBUTEROL HCL 0.63 MG/3ML IN NEBU
0.6300 mg | INHALATION_SOLUTION | Freq: Four times a day (QID) | RESPIRATORY_TRACT | Status: DC
  Administered 2013-12-15 – 2013-12-18 (×11): 0.63 mg via RESPIRATORY_TRACT
  Filled 2013-12-15 (×18): qty 3

## 2013-12-15 MED ORDER — FENTANYL CITRATE 0.05 MG/ML IJ SOLN
INTRAMUSCULAR | Status: DC | PRN
  Administered 2013-12-15: 100 ug via INTRAVENOUS
  Administered 2013-12-15 (×5): 50 ug via INTRAVENOUS
  Administered 2013-12-15: 150 ug via INTRAVENOUS
  Administered 2013-12-15 (×3): 50 ug via INTRAVENOUS

## 2013-12-15 MED ORDER — BISACODYL 5 MG PO TBEC
10.0000 mg | DELAYED_RELEASE_TABLET | Freq: Every day | ORAL | Status: DC
  Administered 2013-12-16 – 2013-12-20 (×4): 10 mg via ORAL
  Filled 2013-12-15 (×5): qty 2

## 2013-12-15 MED ORDER — HEMOSTATIC AGENTS (NO CHARGE) OPTIME
TOPICAL | Status: DC | PRN
  Administered 2013-12-15: 1 via TOPICAL

## 2013-12-15 MED ORDER — ONDANSETRON HCL 4 MG/2ML IJ SOLN: 4.0000 mg | Freq: Four times a day (QID) | INTRAMUSCULAR | Status: DC | PRN

## 2013-12-15 MED ORDER — ONDANSETRON HCL 4 MG/2ML IJ SOLN
INTRAMUSCULAR | Status: DC | PRN
  Administered 2013-12-15: 4 mg via INTRAVENOUS

## 2013-12-15 MED ORDER — NEOSTIGMINE METHYLSULFATE 1 MG/ML IJ SOLN
INTRAMUSCULAR | Status: AC
  Filled 2013-12-15: qty 10

## 2013-12-15 MED ORDER — HYDROMORPHONE HCL PF 1 MG/ML IJ SOLN
0.2500 mg | INTRAMUSCULAR | Status: DC | PRN
  Administered 2013-12-15 (×4): 0.5 mg via INTRAVENOUS

## 2013-12-15 MED ORDER — LACTATED RINGERS IV SOLN
INTRAVENOUS | Status: DC | PRN
  Administered 2013-12-15: 08:00:00 via INTRAVENOUS

## 2013-12-15 MED ORDER — DEXTROSE 5 % IV SOLN
1.5000 g | INTRAVENOUS | Status: DC
  Filled 2013-12-15: qty 1.5

## 2013-12-15 MED ORDER — ENOXAPARIN SODIUM 30 MG/0.3ML ~~LOC~~ SOLN
30.0000 mg | Freq: Two times a day (BID) | SUBCUTANEOUS | Status: DC
  Filled 2013-12-15 (×2): qty 0.3

## 2013-12-15 MED ORDER — GLYCOPYRROLATE 0.2 MG/ML IJ SOLN
INTRAMUSCULAR | Status: DC | PRN
  Administered 2013-12-15: 1 mg via INTRAVENOUS
  Administered 2013-12-15: 0.2 mg via INTRAVENOUS

## 2013-12-15 MED ORDER — ACETAMINOPHEN 500 MG PO TABS
1000.0000 mg | ORAL_TABLET | Freq: Four times a day (QID) | ORAL | Status: AC
  Administered 2013-12-16: 1000 mg via ORAL
  Filled 2013-12-15: qty 2

## 2013-12-15 MED ORDER — OXYCODONE HCL 5 MG/5ML PO SOLN: 5.0000 mg | Freq: Once | ORAL | Status: DC | PRN

## 2013-12-15 MED ORDER — ONDANSETRON HCL 4 MG/2ML IJ SOLN: 4.0000 mg | Freq: Once | INTRAMUSCULAR | Status: DC | PRN

## 2013-12-15 MED ORDER — PHENYLEPHRINE HCL 10 MG/ML IJ SOLN
10.0000 mg | INTRAVENOUS | Status: DC | PRN
  Administered 2013-12-15: 15 ug/min via INTRAVENOUS

## 2013-12-15 MED ORDER — OXYCODONE HCL 5 MG PO TABS: 5.0000 mg | ORAL_TABLET | ORAL | Status: AC | PRN

## 2013-12-15 MED ORDER — AMLODIPINE BESYLATE 10 MG PO TABS
10.0000 mg | ORAL_TABLET | Freq: Every day | ORAL | Status: DC
  Administered 2013-12-16 – 2013-12-20 (×5): 10 mg via ORAL
  Filled 2013-12-15 (×5): qty 1

## 2013-12-15 MED ORDER — HYDROCHLOROTHIAZIDE 25 MG PO TABS
25.0000 mg | ORAL_TABLET | Freq: Every day | ORAL | Status: DC
  Administered 2013-12-16 – 2013-12-20 (×5): 25 mg via ORAL
  Filled 2013-12-15 (×5): qty 1

## 2013-12-15 MED ORDER — EPHEDRINE SULFATE 50 MG/ML IJ SOLN
INTRAMUSCULAR | Status: AC
  Filled 2013-12-15: qty 1

## 2013-12-15 MED ORDER — ONDANSETRON HCL 4 MG/2ML IJ SOLN
4.0000 mg | Freq: Four times a day (QID) | INTRAMUSCULAR | Status: DC | PRN
  Administered 2013-12-15 – 2013-12-16 (×2): 4 mg via INTRAVENOUS
  Filled 2013-12-15 (×2): qty 2

## 2013-12-15 MED ORDER — NALOXONE HCL 0.4 MG/ML IJ SOLN: 0.4000 mg | INTRAMUSCULAR | Status: DC | PRN

## 2013-12-15 MED ORDER — OXYCODONE HCL 5 MG PO TABS: 5.0000 mg | ORAL_TABLET | Freq: Once | ORAL | Status: DC | PRN

## 2013-12-15 MED ORDER — LEVOTHYROXINE SODIUM 88 MCG PO TABS
88.0000 ug | ORAL_TABLET | Freq: Every day | ORAL | Status: DC
  Administered 2013-12-16 – 2013-12-20 (×5): 88 ug via ORAL
  Filled 2013-12-15 (×6): qty 1

## 2013-12-15 MED ORDER — MIDAZOLAM HCL 5 MG/5ML IJ SOLN
INTRAMUSCULAR | Status: DC | PRN
  Administered 2013-12-15: 2 mg via INTRAVENOUS

## 2013-12-15 SURGICAL SUPPLY — 87 items
APPLICATOR TIP COSEAL (VASCULAR PRODUCTS) IMPLANT
APPLICATOR TIP EXT COSEAL (VASCULAR PRODUCTS) ×3 IMPLANT
BLADE SURG 11 STRL SS (BLADE) IMPLANT
BRUSH CYTOL CELLEBRITY 1.5X140 (MISCELLANEOUS) IMPLANT
CANISTER SUCTION 2500CC (MISCELLANEOUS) ×3 IMPLANT
CATH KIT ON Q 5IN SLV (PAIN MANAGEMENT) ×3 IMPLANT
CATH THORACIC 28FR (CATHETERS) ×3 IMPLANT
CATH THORACIC 36FR (CATHETERS) IMPLANT
CATH THORACIC 36FR RT ANG (CATHETERS) IMPLANT
CLIP TI MEDIUM 6 (CLIP) ×3 IMPLANT
CONN ST 1/4X3/8  BEN (MISCELLANEOUS) ×2
CONN ST 1/4X3/8 BEN (MISCELLANEOUS) ×4 IMPLANT
CONN Y 3/8X3/8X3/8  BEN (MISCELLANEOUS) ×1
CONN Y 3/8X3/8X3/8 BEN (MISCELLANEOUS) ×2 IMPLANT
CONT SPEC 4OZ CLIKSEAL STRL BL (MISCELLANEOUS) ×24 IMPLANT
COVER TABLE BACK 60X90 (DRAPES) ×3 IMPLANT
DRAIN CHANNEL 28F RND 3/8 FF (WOUND CARE) ×3 IMPLANT
DRAIN CHANNEL 32F RND 10.7 FF (WOUND CARE) IMPLANT
DRAPE LAPAROSCOPIC ABDOMINAL (DRAPES) ×3 IMPLANT
DRAPE PROXIMA HALF (DRAPES) ×6 IMPLANT
DRAPE WARM FLUID 44X44 (DRAPE) ×3 IMPLANT
DRILL BIT 7/64X5 (BIT) IMPLANT
ELECT BLADE 4.0 EZ CLEAN MEGAD (MISCELLANEOUS) ×3
ELECT REM PT RETURN 9FT ADLT (ELECTROSURGICAL) ×3
ELECTRODE BLDE 4.0 EZ CLN MEGD (MISCELLANEOUS) ×2 IMPLANT
ELECTRODE REM PT RTRN 9FT ADLT (ELECTROSURGICAL) ×2 IMPLANT
FORCEPS BIOP RJ4 1.8 (CUTTING FORCEPS) IMPLANT
GLOVE BIO SURGEON STRL SZ 6.5 (GLOVE) ×6 IMPLANT
GOWN STRL NON-REIN LRG LVL3 (GOWN DISPOSABLE) ×12 IMPLANT
HANDLE STAPLE ENDO GIA SHORT (STAPLE) ×1
KIT BASIN OR (CUSTOM PROCEDURE TRAY) ×3 IMPLANT
KIT ROOM TURNOVER OR (KITS) ×3 IMPLANT
KIT SUCTION CATH 14FR (SUCTIONS) ×3 IMPLANT
MARKER SKIN DUAL TIP RULER LAB (MISCELLANEOUS) ×3 IMPLANT
NEEDLE BIOPSY TRANSBRONCH 21G (NEEDLE) IMPLANT
NS IRRIG 1000ML POUR BTL (IV SOLUTION) ×6 IMPLANT
OIL SILICONE PENTAX (PARTS (SERVICE/REPAIRS)) ×3 IMPLANT
PACK CHEST (CUSTOM PROCEDURE TRAY) ×3 IMPLANT
PAD ARMBOARD 7.5X6 YLW CONV (MISCELLANEOUS) ×9 IMPLANT
PASSER SUT SWANSON 36MM LOOP (INSTRUMENTS) IMPLANT
POUCH SPECIMEN RETRIEVAL 10MM (ENDOMECHANICALS) ×3 IMPLANT
RELOAD EGIA 45 MED/THCK PURPLE (STAPLE) ×15 IMPLANT
RELOAD EGIA 45 TAN VASC (STAPLE) ×18 IMPLANT
RELOAD EGIA BLACK ROTIC 45MM (STAPLE) ×6 IMPLANT
SCISSORS LAP 5X35 DISP (ENDOMECHANICALS) IMPLANT
SEALANT PROGEL (MISCELLANEOUS) IMPLANT
SEALANT SURG COSEAL 4ML (VASCULAR PRODUCTS) IMPLANT
SEALANT SURG COSEAL 8ML (VASCULAR PRODUCTS) ×3 IMPLANT
SOLUTION ANTI FOG 6CC (MISCELLANEOUS) ×3 IMPLANT
SPONGE GAUZE 4X4 12PLY (GAUZE/BANDAGES/DRESSINGS) ×3 IMPLANT
SPONGE GAUZE 4X4 12PLY STER LF (GAUZE/BANDAGES/DRESSINGS) ×3 IMPLANT
STAPLER ENDO GIA 12MM SHORT (STAPLE) ×2 IMPLANT
SUT PROLENE 3 0 SH DA (SUTURE) IMPLANT
SUT PROLENE 4 0 RB 1 (SUTURE)
SUT PROLENE 4-0 RB1 .5 CRCL 36 (SUTURE) IMPLANT
SUT SILK  1 MH (SUTURE) ×4
SUT SILK 1 MH (SUTURE) ×8 IMPLANT
SUT SILK 2 0 SH (SUTURE) IMPLANT
SUT SILK 2 0SH CR/8 30 (SUTURE) IMPLANT
SUT SILK 3 0SH CR/8 30 (SUTURE) IMPLANT
SUT STEEL 1 (SUTURE) IMPLANT
SUT VIC AB 1 CTX 18 (SUTURE) ×3 IMPLANT
SUT VIC AB 1 CTX 36 (SUTURE)
SUT VIC AB 1 CTX36XBRD ANBCTR (SUTURE) IMPLANT
SUT VIC AB 2-0 CT1 27 (SUTURE) ×1
SUT VIC AB 2-0 CT1 TAPERPNT 27 (SUTURE) ×2 IMPLANT
SUT VIC AB 2-0 CTX 36 (SUTURE) IMPLANT
SUT VIC AB 2-0 UR6 27 (SUTURE) ×6 IMPLANT
SUT VIC AB 3-0 SH 8-18 (SUTURE) IMPLANT
SUT VIC AB 3-0 X1 27 (SUTURE) ×3 IMPLANT
SUT VICRYL 2 TP 1 (SUTURE) ×3 IMPLANT
SWAB COLLECTION DEVICE MRSA (MISCELLANEOUS) IMPLANT
SYR 20ML ECCENTRIC (SYRINGE) ×3 IMPLANT
SYSTEM SAHARA CHEST DRAIN ATS (WOUND CARE) ×3 IMPLANT
TAPE CLOTH SURG 4X10 WHT LF (GAUZE/BANDAGES/DRESSINGS) ×3 IMPLANT
TAPE UMBILICAL COTTON 1/8X30 (MISCELLANEOUS) ×3 IMPLANT
TIP APPLICATOR SPRAY EXTEND 16 (VASCULAR PRODUCTS) IMPLANT
TOWEL OR 17X24 6PK STRL BLUE (TOWEL DISPOSABLE) ×6 IMPLANT
TOWEL OR 17X26 10 PK STRL BLUE (TOWEL DISPOSABLE) ×6 IMPLANT
TRAP SPECIMEN MUCOUS 40CC (MISCELLANEOUS) ×3 IMPLANT
TRAY FOLEY CATH 14FRSI W/METER (CATHETERS) ×3 IMPLANT
TROCAR XCEL 12X100 BLDLESS (ENDOMECHANICALS) ×3 IMPLANT
TROCAR XCEL NON-BLD 11X100MML (ENDOMECHANICALS) ×3 IMPLANT
TUBE ANAEROBIC SPECIMEN COL (MISCELLANEOUS) IMPLANT
TUBE CONNECTING 12X1/4 (SUCTIONS) ×6 IMPLANT
TUNNELER SHEATH ON-Q 11GX8 DSP (PAIN MANAGEMENT) ×3 IMPLANT
WATER STERILE IRR 1000ML POUR (IV SOLUTION) ×6 IMPLANT

## 2013-12-15 NOTE — H&P (Signed)
[place] Record #[id number] Date of Birth: 12-Apr-1941  Referring: Tanda Rockers, MD Primary Care: [name], MD  Chief Complaint:    Left lung mass   History of Present Illness:    Susan Pittman 73 y.o. female is seen in the office last week for incidental finding of a 2.2 cm nodule in the left lower lobe. She is a lifelong NONsmoker. She has had asthma symptoms in the past usually related to "colds". Over the past several years she has been treated for "pneumonia". She's had no definite occupational exposure to asbestos or other hazardous substances. She denies any history or family history of tuberculosis She does note visiting family in the [place] area of [place] for 2 months and 4 months periods. During that time she noted sinus drainage and a "deep cough". She noted being exposed to several [place] dust storms during her visits.  The patient was referred to cardiology because of increasing difficult to control hypertension a renal artery stenosis was diagnosed. Chest x-ray done preprocedure noted a 2 cm left lower lobe lung mass, this led to further evaluation with CT scan and PET scan. The patient is seen in the office today for further evaluation and treatment of the left lung mass of unknown etiology but possibly low grade malignancy. Since she was seen previously pulmonary function studies have been performed. I discussed the case with Dr. Gwenlyn Found and its okay for the patient to discontinue her Plavix prior to thoracic surgery.   Current Activity/ Functional Status:  Patient is independent with mobility/ambulation, transfers, ADL's, IADL's.  Zubrod Score: At the time of surgery this patient's most appropriate activity status/level should be described as: [x]  Normal activity, no symptoms []  Symptoms, fully ambulatory []  Symptoms, in bed less than or equal to 50% of the time []  Symptoms, in bed greater than 50% of the time but  less than 100% []  Bedridden []  Moribund   Past Medical History  Diagnosis Date  . Asthma   . Hypertension   . GERD (gastroesophageal reflux disease)   . Allergy   . Hypothyroid   . Osteoporosis   . Gastric ulcer   . Hiatal hernia   . Hyperlipidemia     "don't take RX for it" (11/08/2013)  . Renal artery stenosis   . Lung mass     superior segment LLL/notes 11/08/2013  . Pneumonia     "I've had it ?3 times" (11/08/2013)  . Arthritis     "knees, hips, back, hands" (11/08/2013)  . DJD (degenerative joint disease)   . Basal cell carcinoma     "cut them off face & right shoulder" (11/08/2013)    Past Surgical History  Procedure Laterality Date  . Knee arthroscopy Bilateral   . Anterior cervical decomp/discectomy fusion  2000    C5-C6  . Renal artery stent Right 11/08/2013    Archie Endo 11/08/2013  . Tonsillectomy and adenoidectomy  ?1946  . Cholecystectomy  1970's  . Vaginal hysterectomy  1970  . Dilation and curettage of uterus  1960's  . Skin cancer excision    . Coronary angiogram      Hx: of 2/ 2015  . Cardiac catheterization      Hx: of 2/ 2015    Family History  Problem Relation Age of Onset  . Breast cancer Sister   . Emphysema  Father     smoked  . Allergies Sister   . Allergies Brother   . Heart disease Mother   . Heart disease Father     History   Social History  . Marital Status: Married    Spouse Name: N/A    Number of Children: N/A  . Years of Education: N/A   Occupational History  . Retired    Social History Main Topics  . Smoking status: Never Smoker   . Smokeless tobacco: Never Used  . Alcohol Use: No  . Drug Use: No  . Sexual Activity: Yes     History  Smoking status  . Never Smoker   Smokeless tobacco  . Never Used    History  Alcohol Use No     Allergies  Allergen Reactions  . Benazepril Hcl Anaphylaxis and Cough  . Loratadine Anaphylaxis    anaphylaxis  . Celecoxib     REACTION: aching  . Lisinopril     REACTION: cough  .  Allegra [Fexofenadine Hcl]     Severe back pain  . Sulfa Antibiotics Hives    Current Facility-Administered Medications  Medication Dose Route Frequency Provider Last Rate Last Dose  . cefUROXime (ZINACEF) 1.5 g in dextrose 5 % 50 mL IVPB  1.5 g Intravenous 60 min Pre-Op Grace Isaac, MD         REVIEW OF SYSTEMS:   Review of Systems:     Cardiac Review of Systems: Y or N  Chest Pain [  n  ]  Resting SOB [ n  ] Exertional SOB  [ n ]  Orthopnea [ n ]   Pedal Edema [n   ]    Palpitations [n  ] Syncope  [ n ]   Presyncope [ n  ]  General Review of Systems: [Y] = yes [  ]=no Constitional: recent weight change [ n ];  Wt loss over the last 3 months [ none  ] anorexia [  n]; fatigue [ n ]; nausea [n]; night sweats [ n ]; fever [  n]; or chills [n];          Dental: poor dentition[ n ]; Last Dentist visit:   Eye : blurred vision [  ]; diplopia [   ]; vision changes Blue.Reese  ];  Amaurosis fugax[  ]; Resp: cough Blue.Reese  ];  wheezing[ y ];  hemoptysis[n  ]; shortness of breath[  n ]; paroxysmal nocturnal dyspnea[ n ]; dyspnea on exertion[ n ]; or orthopnea[n  ];  GI:  gallstones[removed  ], vomiting[n  ];  dysphagia[  ]; melena[  n];  hematochezia [  ]; heartburn[  ];   Hx of  Colonoscopy[ y due 13 ]; GU: kidney stones [  ]; hematuria[ n ];   dysuria [  ];  nocturia[  ];  history of     obstruction [  ]; urinary frequency [ n ]             Skin: rash, swelling[  ];, hair loss[  ];  peripheral edema[  ];  or itching[  ]; Musculosketetal: myalgias[  ];  joint swelling[ y ];  joint erythema[  ];  joint pain[y  ];  back pain[  ];  Heme/Lymph: bruising[  ];  bleeding[  ];  anemia[  ];  Neuro: TIA[  ];  headaches[  ];  stroke[  ];  vertigo[  ];  seizures[  ];   paresthesias[ n ];  difficulty  walking[n  ];  Psych:depression[  ]; anxiety[  ];  Endocrine: diabetes[  ];  thyroid dysfunction[  ];  Immunizations: Flu up to date [ y ]; Pneumococcal up to date Blue.Reese  ];  Other:  Physical Exam: BP 165/70  Pulse  50  Temp(Src) 98 F (36.7 C) (Oral)  Resp 18  Ht 5\' 5"  (1.651 m)  Wt 244 lb (110.678 kg)  BMI 40.60 kg/m2  SpO2 100%  PHYSICAL EXAMINATION:  General appearance: alert, cooperative, appears stated age and no distress Neurologic: intact Heart: regular rate and rhythm, S1, S2 normal, no murmur, click, rub or gallop Lungs: clear to auscultation bilaterally Abdomen: soft, non-tender; bowel sounds normal; no masses,  no organomegaly Extremities: evidence of previous bilaterial knee surgery Patient has no carotid bruits, no palpable cervical or supraclavicular or axillary adenopathy   Diagnostic Studies & Laboratory data:     Recent Radiology Findings:   Dg Chest 2 View  11/02/2013   CLINICAL DATA:  Preop for renal surgery.  Hypertension.  EXAM: CHEST  2 VIEW  COMPARISON:  01/28/2008  FINDINGS: Probable cholecystectomy. Lower cervical spine fixation. Midline trachea. Borderline cardiomegaly. Aortic atherosclerosis. Mediastinal contours otherwise within normal limits. No pleural effusion or pneumothorax. Subtle nodular density projecting over the left upper lobe on the frontal radiograph, measuring 2.2 cm.  IMPRESSION: Suspicion of a 2.2 cm left upper lobe pulmonary nodule. Recommend further characterization with chest CT, preferably with contrast.  These results will be called to the ordering clinician or representative by the Radiologist Assistant, and communication documented in the PACS Dashboard.   Electronically Signed   By: Abigail Miyamoto M.D.   On: 11/02/2013 13:29   Ct Chest W Contrast  11/05/2013   CLINICAL DATA:  Pulmonary nodule. Abnormal chest x-ray on 11/02/2013  EXAM: CT CHEST WITH CONTRAST  TECHNIQUE: Multidetector CT imaging of the chest was performed during intravenous contrast administration.  CONTRAST:  71mL OMNIPAQUE IOHEXOL 300 MG/ML  SOLN  COMPARISON:  Chest x-rays dated 11/02/2013, 01/28/2008, and 01/23/2007  FINDINGS: There is a 21 x 18 x 14 mm ill-defined inhomogeneous mass  in the superior segment of the left lower lobe worrisome for non-small cell lung cancer.  There is no hilar or mediastinal adenopathy. The lungs are otherwise clear. Heart size and pulmonary vascularity are normal. No effusions. No osseous abnormality.  There is slight hepatic steatosis.  IMPRESSION: 21 mm ill-defined mass in the superior segment of the left lower lobe. The appearance is typical for non-small-cell lung cancer.   Electronically Signed   By: Rozetta Nunnery M.D.   On: 11/05/2013 10:20   Nm Pet Image Initial (pi) Skull Base To Thigh  11/23/2013   CLINICAL DATA:  Initial treatment strategy for left lower lobe pulmonary nodule.  EXAM: NUCLEAR MEDICINE PET SKULL BASE TO THIGH  FASTING BLOOD GLUCOSE:  Value: 99mg /dl  TECHNIQUE: 18.9 mCi F-18 FDG was injected intravenously. CT data was obtained and used for attenuation correction and anatomic localization only. (This was not acquired as a diagnostic CT examination.) Additional exam technical data entered on technologist worksheet.  COMPARISON:  CT CHEST W/CM dated 11/05/2013; DG CHEST 2 VIEW dated 11/02/2013  FINDINGS: NECK  No hypermetabolic cervical lymph nodes are identified.There are no lesions of the pharyngeal mucosal space.  CHEST  The sub solid nodule in the superior segment of the left lower lobe demonstrates metabolic activity which is only slightly greater than background. SUV max is 1.8. This nodule measures 2.1 x 1.6 cm on image  number 80. No hypermetabolic pulmonary nodules are demonstrated. There is no hypermetabolic mediastinal or hilar nodal activity.  ABDOMEN/PELVIS  There is no hypermetabolic activity within the liver, adrenal glands, spleen or pancreas. There is no hypermetabolic nodal activity. There is nonspecific low level activity associate with the stomach and sigmoid colon, the latter demonstrating mild diverticulosis. Patient's by is status post cholecystectomy and partial hysterectomy.  SKELETON  There is no hypermetabolic activity  to suggest osseous metastatic disease.  IMPRESSION: 1. Dominant sub solid left lower lobe nodule does not demonstrate significantly increased metabolic activity. However, morphologically, adenocarcinoma is still a concern. Thoracic surgery consultation is recommended. These recommendations are taken from "Recommendations for the Management of Subsolid Pulmonary Nodules Detected at CT: A Statement from the [place]" Radiology 2013; 266:1, 304-317. 2. No evidence of metastatic disease.   Electronically Signed   By: Camie Patience M.D.   On: 11/23/2013 10:30      Recent Lab Findings: Lab Results  Component Value Date   WBC 7.9 12/12/2013   HGB 13.5 12/12/2013   HCT 41.8 12/12/2013   PLT 267 12/12/2013   GLUCOSE 117* 12/14/2013   CHOL 180 10/13/2013   TRIG 105 10/13/2013   HDL 52 10/13/2013   LDLDIRECT 126.3 09/10/2012   LDLCALC 107* 10/13/2013   ALT 17 12/14/2013   AST 17 12/14/2013   NA 141 12/14/2013   K 4.8 12/14/2013   CL 102 12/14/2013   CREATININE 1.09 12/14/2013   BUN 32* 12/14/2013   CO2 25 12/14/2013   TSH 3.158 10/13/2013   INR 1.06 12/13/2013   FEV1 1.79 85% DLCO 18.9 %78   Assessment / Plan:   1.)  2.2 cm semisolid left lower lobe lung nodule and lifelong nonsmoker, with possible exposure to Coccidioides. The lesion also could easily be bronchial alveolar carcinoma. The possibility of diagnosis of lung cancer is reviewed with the patient. Primary video assisted thoracoscopy and resection versus CT-guided needle biopsy first and depending on results proceeding with surgical resection. After discussing the risks and options of each approach the patient is agreeable with proceeding with left video-assisted thoracoscopy wedge resection of the lesion possible lobectomy. She will stop her Plavix today and we will plan to proceed on Wednesday, February 11 with bronchoscopy left video-assisted thoracoscopy wedge resection possible lobectomy.   2.) renal vascular hypertension status post  stent stable renal function  The goals risks and alternatives of the planned surgical procedure left vats lung resection, bronch  have been discussed with the patient in detail. The risks of the procedure including death, infection, stroke, myocardial infarction, bleeding, blood transfusion have all been discussed specifically.  I have quoted Susan Pittman a 3 % of perioperative mortality and a complication rate as high as 20 %. The patient's questions have been answered.Susan Pittman is willing  to proceed with the planned procedure.    Grace Isaac MD      [place] [place] [address] Office [place]  12/15/2013 7:14 AM

## 2013-12-15 NOTE — Anesthesia Postprocedure Evaluation (Signed)
  Anesthesia Post-op Note  Patient: Susan Pittman  Procedure(s) Performed: Procedure(s): VIDEO BRONCHOSCOPY (N/A) VIDEO ASSISTED THORACOSCOPY (VATS)/WEDGE RESECTION (Left) LOBECTOMY (Left)  Patient Location: PACU  Anesthesia Type:General  Level of Consciousness: awake  Airway and Oxygen Therapy: Patient Spontanous Breathing  Post-op Pain: mild  Post-op Assessment: Post-op Vital signs reviewed, Patient's Cardiovascular Status Stable, Respiratory Function Stable, Patent Airway, No signs of Nausea or vomiting and Pain level controlled  Post-op Vital Signs: Reviewed and stable  Complications: No apparent anesthesia complications

## 2013-12-15 NOTE — Transfer of Care (Signed)
Immediate Anesthesia Transfer of Care Note  Patient: Susan Pittman  Procedure(s) Performed: Procedure(s): VIDEO BRONCHOSCOPY (N/A) VIDEO ASSISTED THORACOSCOPY (VATS)/WEDGE RESECTION (Left) LOBECTOMY (Left)  Patient Location: PACU  Anesthesia Type:General  Level of Consciousness: awake, alert  and oriented  Airway & Oxygen Therapy: Patient Spontanous Breathing  Post-op Assessment: Report given to PACU RN  Post vital signs: Reviewed and stable  Complications: No apparent anesthesia complications

## 2013-12-15 NOTE — Brief Op Note (Addendum)
12/15/2013  1:24 PM  PATIENT:  Susan Pittman  73 y.o. female  PRE-OPERATIVE DIAGNOSIS:  LUNG LESION  POST-OPERATIVE DIAGNOSIS:  LUNG LESION, adeno carcinoma by frozen section  PROCEDURE:  Procedure(s):  VIDEO BRONCHOSCOPY (N/A)  VIDEO ASSISTED THORACOSCOPY (VATS)/WEDGE RESECTION (Left)  MINI THORACOTOMY -LOBECTOMY LEFT LOWER LOBE with lymph node dissection  Placement of ON Q    SURGEON:  Surgeon(s) and Role:    * Grace Isaac, MD - Primary  PHYSICIAN ASSISTANT: Erin Barrett PA-C  ANESTHESIA:   general  EBL:  Total I/O In: 1000 [I.V.:1000] Out: 505 [Urine:305; Blood:200]  BLOOD ADMINISTERED:none  DRAINS: Left pleural chest tubes   LOCAL MEDICATIONS USED:  MARCAINE     SPECIMEN:  Source of Specimen:  Left Lower Lobe, Lymph Nodes 8,10,11,12  DISPOSITION OF SPECIMEN:  PATHOLOGY  COUNTS:  YES   DICTATION: .Dragon Dictation  PLAN OF CARE: Admit to inpatient   PATIENT DISPOSITION:  ICU - extubated and stable.   Delay start of Pharmacological VTE agent (>24hrs) due to surgical blood loss or risk of bleeding: yes

## 2013-12-15 NOTE — Preoperative (Signed)
Beta Blockers   Reason not to administer Beta Blockers:Not Applicable 

## 2013-12-15 NOTE — Anesthesia Preprocedure Evaluation (Addendum)
Anesthesia Evaluation  Patient identified by MRN, date of birth, ID band Patient awake    Reviewed: Allergy & Precautions, H&P , NPO status , Patient's Chart, lab work & pertinent test results  Airway Mallampati: II TM Distance: >3 FB Neck ROM: Full    Dental   Pulmonary          Cardiovascular hypertension, Pt. on medications Rhythm:Regular Rate:Normal  EF 55%   Neuro/Psych    GI/Hepatic GERD-  Controlled,  Endo/Other  Hypothyroidism   Renal/GU Renal disease     Musculoskeletal   Abdominal   Peds  Hematology   Anesthesia Other Findings   Reproductive/Obstetrics                         Anesthesia Physical Anesthesia Plan  ASA: III  Anesthesia Plan: General   Post-op Pain Management:    Induction: Intravenous  Airway Management Planned: Double Lumen EBT  Additional Equipment: Arterial line and CVP  Intra-op Plan:   Post-operative Plan: Extubation in OR  Informed Consent: I have reviewed the patients History and Physical, chart, labs and discussed the procedure including the risks, benefits and alternatives for the proposed anesthesia with the patient or authorized representative who has indicated his/her understanding and acceptance.   Dental advisory given  Plan Discussed with: CRNA and Surgeon  Anesthesia Plan Comments:        Anesthesia Quick Evaluation

## 2013-12-15 NOTE — Progress Notes (Signed)
CT surgery p.m. Rounds    status post left lower lobectomy earlier today Extubated breathing comfortably Hemodynamic stable Minimal chest tube output without airleak Doing well

## 2013-12-16 ENCOUNTER — Encounter (HOSPITAL_COMMUNITY): Payer: Self-pay | Admitting: Cardiothoracic Surgery

## 2013-12-16 ENCOUNTER — Inpatient Hospital Stay (HOSPITAL_COMMUNITY): Payer: Medicare Other

## 2013-12-16 DIAGNOSIS — J90 Pleural effusion, not elsewhere classified: Secondary | ICD-10-CM | POA: Diagnosis not present

## 2013-12-16 LAB — BASIC METABOLIC PANEL
BUN: 29 mg/dL — ABNORMAL HIGH (ref 6–23)
CHLORIDE: 104 meq/L (ref 96–112)
CO2: 25 meq/L (ref 19–32)
Calcium: 8.1 mg/dL — ABNORMAL LOW (ref 8.4–10.5)
Creatinine, Ser: 1.04 mg/dL (ref 0.50–1.10)
GFR calc Af Amer: 59 mL/min — ABNORMAL LOW (ref >90)
GFR calc non Af Amer: 51 mL/min — ABNORMAL LOW (ref >90)
GLUCOSE: 123 mg/dL — AB (ref 70–99)
Potassium: 4.4 mEq/L (ref 3.7–5.3)
SODIUM: 141 meq/L (ref 137–147)

## 2013-12-16 LAB — CBC
HCT: 33.6 % — ABNORMAL LOW (ref 36.0–46.0)
Hemoglobin: 11.1 g/dL — ABNORMAL LOW (ref 12.0–15.0)
MCH: 29.3 pg (ref 26.0–34.0)
MCHC: 33 g/dL (ref 30.0–36.0)
MCV: 88.7 fL (ref 78.0–100.0)
Platelets: 234 10*3/uL (ref 150–400)
RBC: 3.79 MIL/uL — AB (ref 3.87–5.11)
RDW: 15 % (ref 11.5–15.5)
WBC: 11.8 10*3/uL — AB (ref 4.0–10.5)

## 2013-12-16 LAB — POCT I-STAT 3, ART BLOOD GAS (G3+)
Acid-Base Excess: 1 mmol/L (ref 0.0–2.0)
Bicarbonate: 27.1 mEq/L — ABNORMAL HIGH (ref 20.0–24.0)
O2 Saturation: 93 %
Patient temperature: 98.1
TCO2: 29 mmol/L (ref 0–100)
pCO2 arterial: 47.6 mmHg — ABNORMAL HIGH (ref 35.0–45.0)
pH, Arterial: 7.363 (ref 7.350–7.450)
pO2, Arterial: 71 mmHg — ABNORMAL LOW (ref 80.0–100.0)

## 2013-12-16 MED ORDER — CETIRIZINE HCL 10 MG PO TABS
10.0000 mg | ORAL_TABLET | Freq: Every day | ORAL | Status: DC
  Administered 2013-12-16 – 2013-12-19 (×4): 10 mg via ORAL
  Filled 2013-12-16 (×7): qty 1

## 2013-12-16 MED ORDER — NON FORMULARY
10.0000 mg | Freq: Every day | Status: DC
Start: 2013-12-16 — End: 2013-12-16

## 2013-12-16 MED ORDER — ENOXAPARIN SODIUM 40 MG/0.4ML ~~LOC~~ SOLN
40.0000 mg | SUBCUTANEOUS | Status: DC
  Administered 2013-12-17 – 2013-12-19 (×3): 40 mg via SUBCUTANEOUS
  Filled 2013-12-16 (×3): qty 0.4

## 2013-12-16 MED ORDER — ENOXAPARIN SODIUM 40 MG/0.4ML ~~LOC~~ SOLN
40.0000 mg | Freq: Two times a day (BID) | SUBCUTANEOUS | Status: DC
  Administered 2013-12-16: 40 mg via SUBCUTANEOUS
  Filled 2013-12-16 (×4): qty 0.4

## 2013-12-16 NOTE — Progress Notes (Signed)
Utilization Review Completed.  

## 2013-12-16 NOTE — Progress Notes (Signed)
Patient ID: Susan Pittman, female   DOB: 1941/03/03, 73 y.o.   MRN: [id number] EVENING ROUNDS NOTE :     [address]       [place] [address]             [phone]                 1 Day Post-Op Procedure(s) (LRB): VIDEO BRONCHOSCOPY (N/A) VIDEO ASSISTED THORACOSCOPY (VATS)/WEDGE RESECTION (Left) LOBECTOMY (Left)  Total Length of Stay:  LOS: 1 day  BP 91/38  Pulse 47  Temp(Src) 98.7 F (37.1 C) (Oral)  Resp 14  Ht 5\' 5"  (1.651 m)  Wt 244 lb (110.678 kg)  BMI 40.60 kg/m2  SpO2 95%  .Intake/Output     02/11 0701 - 02/12 0700 02/12 0701 - 02/13 0700   P.O.  100   I.V. (mL/kg) 3187.5 (28.8) 651.7 (5.9)   Total Intake(mL/kg) 3187.5 (28.8) 751.7 (6.8)   Urine (mL/kg/hr) 705 (0.3) 125 (0.1)   Blood 200 (0.1)    Chest Tube 390 (0.1) 230 (0.2)   Total Output 1295 355   Net +1892.5 +396.7          . dextrose 5 % and 0.45% NaCl 50 mL/hr (12/16/13 0820)     Lab Results  Component Value Date   WBC 11.8* 12/16/2013   HGB 11.1* 12/16/2013   HCT 33.6* 12/16/2013   PLT 234 12/16/2013   GLUCOSE 123* 12/16/2013   CHOL 180 10/13/2013   TRIG 105 10/13/2013   HDL 52 10/13/2013   LDLDIRECT 126.3 09/10/2012   LDLCALC 107* 10/13/2013   ALT 17 12/14/2013   AST 17 12/14/2013   NA 141 12/16/2013   K 4.4 12/16/2013   CL 104 12/16/2013   CREATININE 1.04 12/16/2013   BUN 29* 12/16/2013   CO2 25 12/16/2013   TSH 3.158 10/13/2013   INR 1.06 12/13/2013   Foley came out , has not voided yet to get bladder scan may new foley back preop ua does not indicated UTI  Grace Isaac MD  Beeper [phone] Office [phone] 12/16/2013 6:09 PM

## 2013-12-16 NOTE — Progress Notes (Addendum)
TCTS DAILY ICU PROGRESS NOTE                   [address]            [place] [address]          [phone]   1 Day Post-Op Procedure(s) (LRB): VIDEO BRONCHOSCOPY (N/A) VIDEO ASSISTED THORACOSCOPY (VATS)/WEDGE RESECTION (Left) LOBECTOMY (Left)  Total Length of Stay:  LOS: 1 day   Subjective: Feels pretty well, no SOB, pain controlled. Had some nausea yesterday, but has resolved  Objective: Vital signs in last 24 hours: Temp:  [97.8 F (36.6 C)-98.8 F (37.1 C)] 98.1 F (36.7 C) (02/12 0341) Pulse Rate:  [30-99] 51 (02/12 0700) Cardiac Rhythm:  [-] Sinus bradycardia (02/12 0400) Resp:  [11-22] 18 (02/12 0729) BP: (93-156)/(35-81) 99/36 mmHg (02/12 0700) SpO2:  [81 %-100 %] 99 % (02/12 0729) Arterial Line BP: (93-206)/(36-86) 121/39 mmHg (02/12 0700)  Filed Weights   12/15/13 0652  Weight: 244 lb (110.678 kg)    Weight change:    Hemodynamic parameters for last 24 hours:    Intake/Output from previous day: 02/11 0701 - 02/12 0700 In: 3187.5 [I.V.:3187.5] Out: 1295 [Urine:705; Blood:200; Chest Tube:390]  Intake/Output this shift: Total I/O In: 100 [I.V.:100] Out: -   Current Meds: Scheduled Meds: . acetaminophen  1,000 mg Oral 4 times per day   Or  . acetaminophen (TYLENOL) oral liquid 160 mg/5 mL  1,000 mg Oral 4 times per day  . amLODipine  10 mg Oral Daily  . bisacodyl  10 mg Oral Daily  . enoxaparin (LOVENOX) injection  30 mg Subcutaneous Q12H  . etodolac  400 mg Oral BID  . fentaNYL   Intravenous 6 times per day  . irbesartan  300 mg Oral Daily   And  . hydrochlorothiazide  25 mg Oral Daily  . levalbuterol  0.63 mg Nebulization Q6H  . levothyroxine  88 mcg Oral QAC breakfast  . mupirocin ointment   Nasal BID  . nebivolol  20 mg Oral q morning - 10a  . pantoprazole  20 mg Oral Daily   Continuous Infusions: . dextrose 5 % and 0.45% NaCl 100 mL/hr at 12/16/13 0630   PRN Meds:.albuterol, diphenhydrAMINE, diphenhydrAMINE,  fluticasone, naloxone, ondansetron (ZOFRAN) IV, ondansetron (ZOFRAN) IV, oxyCODONE, oxyCODONE-acetaminophen, potassium chloride, sodium chloride  General appearance: alert, cooperative and no distress Heart: regular rate and rhythm Lungs: crackles in bases Abdomen: benign Extremities: no edema Wound: dressings CDI  Lab Results: CBC: Recent Labs  12/16/13 0409  WBC 11.8*  HGB 11.1*  HCT 33.6*  PLT 234   BMET:  Recent Labs  12/14/13 1250 12/16/13 0409  NA 141 141  K 4.8 4.4  CL 102 104  CO2 25 25  GLUCOSE 117* 123*  BUN 32* 29*  CREATININE 1.09 1.04  CALCIUM 9.4 8.1*    PT/INR:  Recent Labs  12/13/13 0940  LABPROT 13.6  INR 1.06   ABG    Component Value Date/Time   PHART 7.363 12/16/2013 0407   PCO2ART 47.6* 12/16/2013 0407   PO2ART 71.0* 12/16/2013 0407   HCO3 27.1* 12/16/2013 0407   TCO2 29 12/16/2013 0407   O2SAT 93.0 12/16/2013 0407    Radiology: Dg Chest 2 View  12/14/2013   CLINICAL DATA:  Left lower lobe mass  EXAM: CHEST  2 VIEW  COMPARISON:  11/05/2013  FINDINGS: Cardiac shadow is within normal limits. The lungs are well aerated bilaterally. The left lower lobe superior segment  nodule is again noted similar to that seen on prior CT. No acute bony abnormality is noted.  IMPRESSION: Stable left lower lobe mass.   Electronically Signed   By: Inez Catalina M.D.   On: 12/14/2013 14:08   Dg Chest Portable 1 View  12/15/2013   CLINICAL DATA:  Status post VATS and chest tube insertion.  EXAM: PORTABLE CHEST - 1 VIEW  COMPARISON:  Chest x-ray 12/14/2013.  FINDINGS: Postoperative changes of left lower lobectomy. Ill-defined opacities throughout the base of the left hemithorax presumably represents a small amount of left-sided pleural fluid. No appreciable pneumothorax is identified. Two left-sided chest tubes are in position with tip and side ports projecting over the left hemithorax. Prominent infrahilar opacity on the right may represent atelectasis, or could indicate  sequela of recent aspiration. Crowding of the pulmonary vasculature, accentuated by low lung volumes, without frank pulmonary edema. Linear opacity in the right mid lung is presumably subsegmental atelectasis. Heart size appears borderline enlarged, likely accentuated by low lung volumes. Right internal jugular central venous catheter with tip terminating in the distal superior vena cava. Orthopedic fixation hardware in the lower cervical spine. Surgical clips projecting over the right upper quadrant of the abdomen, compatible with prior cholecystectomy.  IMPRESSION: 1. Postoperative changes related to recent left lower lobectomy, with support apparatus in position, as above. 2. Probable small left pleural effusion. 3. Ill-defined opacity in the right infrahilar region may simply reflect some postoperative subsegmental atelectasis, however, recent aspiration could have a similar appearance. Clinical correlation is recommended.   Electronically Signed   By: Vinnie Langton M.D.   On: 12/15/2013 15:58     Assessment/Plan: S/P Procedure(s) (LRB): VIDEO BRONCHOSCOPY (N/A) VIDEO ASSISTED THORACOSCOPY (VATS)/WEDGE RESECTION (Left) LOBECTOMY (Left)  1 doing well 2 d/c a line 3 CT- no air leak, 390 cc out yesterday post op- poss d/c one tube today 4 exp ABL anemia- mild, monitor 5 minor leukocytosis- monitoe 6 atx- push pulm toilet/rehab 7 nutrition- advance as tol 8 sinus brady- will have to watch closely, hold for HR less than 70   GOLD,WAYNE E 12/16/2013 7:42 AM   Transfer to 3s Stable not air leak, ct to water seal likely  d/c one tomorrow I have seen and examined [name] and agree with the above assessment  and plan.  Grace Isaac MD Beeper [phone] Office [phone] 12/16/2013 8:18 AM

## 2013-12-16 NOTE — Op Note (Signed)
Susan Pittman, PORCHIA NO.:  [id number]  MEDICAL RECORD NO.:  [id number]  LOCATION:  2S04C                        FACILITY:  [facility]  PHYSICIAN:  Lanelle Bal, MD    DATE OF BIRTH:  [birth year]  DATE OF PROCEDURE:  12/15/2013 DATE OF DISCHARGE:                              OPERATIVE REPORT   PREOPERATIVE DIAGNOSIS:  Left lower lobe lung mass.  POSTOPERATIVE DIAGNOSIS:  Left lower lobe lung mass.  Adenocarcinoma by frozen section.  PROCEDURE PERFORMED:  Bronchoscopy, left video-assisted thoracoscopy, with wedge resection for biopsy of left lower lobe lesion, completion left lower lobectomy, lymph node dissection, placement of On-Q device.  SURGEON:  Lanelle Bal, MD  FIRST ASSISTANT:  Providence Crosby.  BRIEF HISTORY:  The patient is a 73 year old female with no previous medical history, who presented with difficult to control hypertension and was found to have renal vascular hypertension and underwent placement of a renal artery stent by Dr. Gwenlyn Found approximately 1 month prior to surgery.  At the time of the placement of the stent, a preprocedure chest x-ray was obtained which showed a left lower lobe lung lesion.  This was fully evaluated with a PET scan and was very mildly hypermetabolic with no evidence of nodal involvement.  The patient did have exposure to coccidiomycosis with extended studies in [place].  The options of needle biopsy and then surgical resection versus directly to surgical resection was discussed with the patient, and she was agreeable with proceeding to surgical resection, biopsy and resection if appropriate.  DESCRIPTION OF PROCEDURE:  The patient underwent general endotracheal anesthesia without incident.  Appropriate time-out was performed, and a double-lumen endotracheal tube was placed.  A fiberoptic bronchoscope was then passed in the right and left bronchus.  Double-lumen tube was in proper position.  There were no obvious  endobronchial lesions appreciated.  The scope was then removed.  The patient was turned in lateral decubitus position with the left side up, which was the previously marked site.  The left chest was prepped with Betadine and draped in usual sterile manner.  An initial port site was made in the midaxillary line approximately the fifth intercostal space.  A 30-degree video scope was introduced into the chest.  The pleural space appeared free of any obvious disease.  We then made 2 additional port sites, 1 more anterior and 1 more posterior and through these 3 sites we were able to locate the mass visually in its position in the superior segment of the left lower lobe.  Using purple staplers, a wedge resection of this area was performed.  The specimen was placed in a bag and removed through the port site.  Frozen section of the mass confirmed adenocarcinoma.  We then enlarged one of the port sites and through the slightly larger incision in 2 port sites proceeded to perform a left lower lobectomy.  The inferior pulmonary vein was identified and stapled with a vascular stapler.  The arterial branch to the superior segment was identified and divided with a vascular stapler.  The lower lobe pulmonary artery branch was also dissected down along the fissure and divided.  The left lower lobe bronchus was identified and then  using a block stapler was clamped with ventilation of the left lung.  The left upper lobe inflated well.  The bronchus was then divided.  The fissures were completed with purple staplers and the specimen removed and submitted to Pathology.  With previous wedge resection staple line marked with a black silk suture.  We then proceeded with dissection of lymph nodes and in separate cups #8 lymph node, 11 L, 10 L, and 12 L lymph nodes were submitted to Pathology for permanent section.  A small incision was made posteriorly and through this, an On-Q catheter was tunneled  subpleurally and positioned to attaching the On-Q device.  The lung was inflated and the bronchial stump tested for evidence of air leak, there was none.  A posterior Blake drain was left in 1 port site. A straight chest tube was left in anterior port site.  The remaining incision was then closed with drilling the inferior rib and 2 pericostal sutures were placed.  The muscle layers were closed with interrupted 0 Vicryl, running 2-0 Vicryl in subcutaneous tissue, and 3-0 subcuticular stitch in skin edges.  Dermabond was placed.  Dry dressings were applied.  The lung had inflated nicely at the completion of the procedure.  Sponge and needle count was reported as correct at completion.  Estimated blood loss was approximately 100-150 mL.  The patient was extubated in the operating room and transferred to the recovery room for further postoperative care.     Lanelle Bal, MD     EG/MEDQ  D:  12/16/2013  T:  12/16/2013  Job:  [id number]

## 2013-12-16 NOTE — Clinical Documentation Improvement (Signed)
THIS DOCUMENT IS NOT A PERMANENT PART OF THE MEDICAL RECORD  Please update your documentation with the medical record to reflect your response to this query. If you need help knowing how to do this please call [phone].  12/16/13   Dear Dr.Ishmel Acevedo / Associates,  In a better effort to capture your patient's severity of illness, reflect appropriate length of stay and utilization of resources, a review of the patient medical record has revealed the following indicators.    Based on your clinical judgment, please clarify and document in a progress note and/or discharge summary the clinical condition associated with the following supporting information:  In responding to this query please exercise your independent judgment.  The fact that a query is asked, does not imply that any particular answer is desired or expected.   Possible Clinical Conditions?  "          UTI   "          Other Condition  "          Cannot Clinically Determine    Labs: 02/11: abnormal urinalysis. 02/11: abnormal urine micro.   You may use possible, probable, or suspect with inpatient documentation. possible, probable, suspected diagnoses MUST be documented at the time of discharge  Reviewed:  no additional documentation provided  Thank Sherian Maroon Documentation Specialist: [place]

## 2013-12-17 ENCOUNTER — Inpatient Hospital Stay (HOSPITAL_COMMUNITY): Payer: Medicare Other

## 2013-12-17 ENCOUNTER — Encounter (HOSPITAL_COMMUNITY): Payer: Self-pay | Admitting: Cardiothoracic Surgery

## 2013-12-17 DIAGNOSIS — C3430 Malignant neoplasm of lower lobe, unspecified bronchus or lung: Secondary | ICD-10-CM | POA: Diagnosis present

## 2013-12-17 DIAGNOSIS — J9819 Other pulmonary collapse: Secondary | ICD-10-CM | POA: Diagnosis not present

## 2013-12-17 LAB — COMPREHENSIVE METABOLIC PANEL
ALT: 17 U/L (ref 0–35)
AST: 22 U/L (ref 0–37)
Albumin: 2.7 g/dL — ABNORMAL LOW (ref 3.5–5.2)
Alkaline Phosphatase: 67 U/L (ref 39–117)
BILIRUBIN TOTAL: 0.7 mg/dL (ref 0.3–1.2)
BUN: 29 mg/dL — AB (ref 6–23)
CHLORIDE: 99 meq/L (ref 96–112)
CO2: 24 meq/L (ref 19–32)
Calcium: 8.1 mg/dL — ABNORMAL LOW (ref 8.4–10.5)
Creatinine, Ser: 1.21 mg/dL — ABNORMAL HIGH (ref 0.50–1.10)
GFR, EST AFRICAN AMERICAN: 49 mL/min — AB (ref >90)
GFR, EST NON AFRICAN AMERICAN: 43 mL/min — AB (ref >90)
Glucose, Bld: 103 mg/dL — ABNORMAL HIGH (ref 70–99)
POTASSIUM: 4 meq/L (ref 3.7–5.3)
SODIUM: 135 meq/L — AB (ref 137–147)
Total Protein: 5.9 g/dL — ABNORMAL LOW (ref 6.0–8.3)

## 2013-12-17 LAB — CBC
HEMATOCRIT: 32.1 % — AB (ref 36.0–46.0)
HEMOGLOBIN: 10.3 g/dL — AB (ref 12.0–15.0)
MCH: 28.8 pg (ref 26.0–34.0)
MCHC: 32.1 g/dL (ref 30.0–36.0)
MCV: 89.7 fL (ref 78.0–100.0)
Platelets: 211 10*3/uL (ref 150–400)
RBC: 3.58 MIL/uL — AB (ref 3.87–5.11)
RDW: 15.4 % (ref 11.5–15.5)
WBC: 10.7 10*3/uL — AB (ref 4.0–10.5)

## 2013-12-17 NOTE — Progress Notes (Signed)
Report called to [facility], 3S-RN. Pt transferred to 3S-12 via ambulation. Pt belongings and family at pt side. Meds in chart, no c/o pain. VS stable at time of transfer. No questions or concerns at this time. Bedside handoff to [place], [place]. [place] notified. Raelle Chambers L

## 2013-12-17 NOTE — Progress Notes (Signed)
Patient transferred from [place] to [place]. Patient arrived via wheel chair on 2L  O2. Chest tube intact, mild tidaling, per 2s RN, MD aware. Patient alert and oriented, all VSS. Patients husband at bedside.Will continue to monitor.

## 2013-12-17 NOTE — Progress Notes (Addendum)
TCTS DAILY ICU PROGRESS NOTE                   [address]            [place] [address]          [phone]   2 Days Post-Op Procedure(s) (LRB): VIDEO BRONCHOSCOPY (N/A) VIDEO ASSISTED THORACOSCOPY (VATS)/WEDGE RESECTION (Left) LOBECTOMY (Left)  Total Length of Stay:  LOS: 2 days   Subjective: Remains comfortable, mild nausea at times with poor appetite  Objective: Vital signs in last 24 hours: Temp:  [97.8 F (36.6 C)-98.7 F (37.1 C)] 97.8 F (36.6 C) (02/13 0745) Pulse Rate:  [44-67] 67 (02/13 0700) Cardiac Rhythm:  [-] Sinus bradycardia (02/13 0730) Resp:  [13-30] 22 (02/13 0732) BP: (86-138)/(38-60) 122/43 mmHg (02/13 0700) SpO2:  [93 %-100 %] 100 % (02/13 0732)  Filed Weights   12/15/13 0652  Weight: 244 lb (110.678 kg)    Weight change:    Hemodynamic parameters for last 24 hours:    Intake/Output from previous day: 02/12 0701 - 02/13 0700 In: 1591.7 [P.O.:340; I.V.:1251.7] Out: 1085 [Urine:655; Chest Tube:430]  Intake/Output this shift: Total I/O In: 50 [I.V.:50] Out: 60 [Chest Tube:60]  Current Meds: Scheduled Meds: . amLODipine  10 mg Oral Daily  . bisacodyl  10 mg Oral Daily  . cetirizine  10 mg Oral QHS  . enoxaparin (LOVENOX) injection  40 mg Subcutaneous Q24H  . etodolac  400 mg Oral BID  . fentaNYL   Intravenous 6 times per day  . irbesartan  300 mg Oral Daily   And  . hydrochlorothiazide  25 mg Oral Daily  . levalbuterol  0.63 mg Nebulization Q6H  . levothyroxine  88 mcg Oral QAC breakfast  . mupirocin ointment   Nasal BID  . nebivolol  20 mg Oral q morning - 10a  . pantoprazole  20 mg Oral Daily   Continuous Infusions: . dextrose 5 % and 0.45% NaCl 50 mL/hr at 12/16/13 2256   PRN Meds:.albuterol, diphenhydrAMINE, diphenhydrAMINE, fluticasone, naloxone, ondansetron (ZOFRAN) IV, ondansetron (ZOFRAN) IV, oxyCODONE-acetaminophen, potassium chloride, sodium chloride  Exam General- alert, NAC Lungs- dim in left  base Cor- RRR Abd- benign Ext- no edema Dressings CDI    Chest tube - no air leak, 430 cc/24 hr    Lab Results: CBC: Recent Labs  12/16/13 0409 12/17/13 0410  WBC 11.8* 10.7*  HGB 11.1* 10.3*  HCT 33.6* 32.1*  PLT 234 211   BMET:  Recent Labs  12/16/13 0409 12/17/13 0410  NA 141 135*  K 4.4 4.0  CL 104 99  CO2 25 24  GLUCOSE 123* 103*  BUN 29* 29*  CREATININE 1.04 1.21*  CALCIUM 8.1* 8.1*    PT/INR: No results found for this basename: LABPROT, INR,  in the last 72 hours Radiology: Dg Chest Port 1 View  12/17/2013   CLINICAL DATA:  Status post thoracic surgery  EXAM: PORTABLE CHEST - 1 VIEW  COMPARISON:  12/16/2013  FINDINGS: Right jugular central line is again identified and stable. Two thoracostomy catheters are seen on the left and stable in appearance. No pneumothorax is identified. Left basilar atelectatic changes are seen. The cardiac shadow is stable.  IMPRESSION: Left basilar atelectasis.  No acute abnormality is noted.   Electronically Signed   By: Inez Catalina M.D.   On: 12/17/2013 07:34   Dg Chest Port 1 View  12/16/2013   CLINICAL DATA:  Status post left VATS  EXAM: PORTABLE  CHEST - 1 VIEW  COMPARISON:  12/15/2013  FINDINGS: Postsurgical changes in the left hemithorax with associated volume loss and two left chest tubes. No pneumothorax.  Right lung is notable for mild perihilar/infrahilar probable atelectasis.  The heart is top-normal in size.  Right IJ venous catheter terminates at the cavoatrial junction.  Cervical spine fixation hardware, incompletely visualized.  IMPRESSION: Postsurgical changes in the left hemithorax with two left chest tubes.  No pneumothorax.   Electronically Signed   By: Julian Hy M.D.   On: 12/16/2013 08:07   Dg Chest Portable 1 View  12/15/2013   CLINICAL DATA:  Status post VATS and chest tube insertion.  EXAM: PORTABLE CHEST - 1 VIEW  COMPARISON:  Chest x-ray 12/14/2013.  FINDINGS: Postoperative changes of left lower  lobectomy. Ill-defined opacities throughout the base of the left hemithorax presumably represents a small amount of left-sided pleural fluid. No appreciable pneumothorax is identified. Two left-sided chest tubes are in position with tip and side ports projecting over the left hemithorax. Prominent infrahilar opacity on the right may represent atelectasis, or could indicate sequela of recent aspiration. Crowding of the pulmonary vasculature, accentuated by low lung volumes, without frank pulmonary edema. Linear opacity in the right mid lung is presumably subsegmental atelectasis. Heart size appears borderline enlarged, likely accentuated by low lung volumes. Right internal jugular central venous catheter with tip terminating in the distal superior vena cava. Orthopedic fixation hardware in the lower cervical spine. Surgical clips projecting over the right upper quadrant of the abdomen, compatible with prior cholecystectomy.  IMPRESSION: 1. Postoperative changes related to recent left lower lobectomy, with support apparatus in position, as above. 2. Probable small left pleural effusion. 3. Ill-defined opacity in the right infrahilar region may simply reflect some postoperative subsegmental atelectasis, however, recent aspiration could have a similar appearance. Clinical correlation is recommended.   Electronically Signed   By: Vinnie Langton M.D.   On: 12/15/2013 15:58     Assessment/Plan: S/P Procedure(s) (LRB): VIDEO BRONCHOSCOPY (N/A) VIDEO ASSISTED THORACOSCOPY (VATS)/WEDGE RESECTION (Left) LOBECTOMY (Left)  1 Doing well 2 d/c anter CT 3 labs stable 4 push pulm toilet as able 5 rehab- routine  Susan Pittman,Susan Pittman 12/17/2013 8:14 AM   Path reviewed with patient Stage I a (pT1a,pN0,cM0) D/C anterior chest tube Waiting for transfer to step down On Lovenox for DVT prophylaxis I have seen and examined Susan Pittman and agree with the above assessment  and plan.  Grace Isaac MD Beeper  [phone] Office [phone] 12/17/2013 8:41 AM

## 2013-12-17 NOTE — Progress Notes (Signed)
Pt. Was able void again 320ml. of urine prior to ambulation, felt better after.

## 2013-12-17 NOTE — Progress Notes (Signed)
Pt. Attempted to void at bedside commode voided 270ml of urine, bladder scanned for residual showed >380. Pt. Denied any discomfort from the bladder  dont have the urge to void, bladder not distended, will cont. to watch and monitor pt. And will scan bladder again with in an hour.

## 2013-12-18 ENCOUNTER — Inpatient Hospital Stay (HOSPITAL_COMMUNITY): Payer: Medicare Other

## 2013-12-18 DIAGNOSIS — J9819 Other pulmonary collapse: Secondary | ICD-10-CM | POA: Diagnosis not present

## 2013-12-18 LAB — CULTURE, RESPIRATORY W GRAM STAIN

## 2013-12-18 LAB — TISSUE CULTURE
Culture: NO GROWTH
Gram Stain: NONE SEEN

## 2013-12-18 LAB — CULTURE, RESPIRATORY

## 2013-12-18 MED ORDER — LACTULOSE 10 GM/15ML PO SOLN
20.0000 g | Freq: Every day | ORAL | Status: DC | PRN
  Filled 2013-12-18: qty 30

## 2013-12-18 MED ORDER — LEVALBUTEROL HCL 0.63 MG/3ML IN NEBU: 0.6300 mg | INHALATION_SOLUTION | Freq: Four times a day (QID) | RESPIRATORY_TRACT | Status: DC | PRN

## 2013-12-18 NOTE — Progress Notes (Addendum)
      [address]       [place] [address]             [phone]      3 Days Post-Op Procedure(s) (LRB): VIDEO BRONCHOSCOPY (N/A) VIDEO ASSISTED THORACOSCOPY (VATS)/WEDGE RESECTION (Left) LOBECTOMY (Left)  Subjective:  Ms. Segall has no complaints this morning.  States she feels good.  + ambulation, No BM  Objective: Vital signs in last 24 hours: Temp:  [97.6 F (36.4 C)-98.7 F (37.1 C)] 98.7 F (37.1 C) (02/14 0800) Pulse Rate:  [51-69] 69 (02/14 0909) Cardiac Rhythm:  [-] Normal sinus rhythm;Sinus bradycardia (02/14 0413) Resp:  [14-23] 20 (02/14 0909) BP: (110-148)/(48-71) 123/63 mmHg (02/14 0413) SpO2:  [92 %-99 %] 94 % (02/14 0909)  Intake/Output from previous day: 02/13 0701 - 02/14 0700 In: 903.5 [P.O.:360; I.V.:543.5] Out: 2665 [Urine:2375; Chest Tube:290] Intake/Output this shift: Total I/O In: -  Out: 400 [Urine:400]  General appearance: alert, cooperative and no distress Heart: regular rate and rhythm Lungs: clear to auscultation bilaterally Abdomen: soft, non-tender; bowel sounds normal; no masses,  no organomegaly Extremities: extremities normal, atraumatic, no cyanosis or edema Wound: clean and dry  Lab Results:  Recent Labs  12/16/13 0409 12/17/13 0410  WBC 11.8* 10.7*  HGB 11.1* 10.3*  HCT 33.6* 32.1*  PLT 234 211   BMET:  Recent Labs  12/16/13 0409 12/17/13 0410  NA 141 135*  K 4.4 4.0  CL 104 99  CO2 25 24  GLUCOSE 123* 103*  BUN 29* 29*  CREATININE 1.04 1.21*  CALCIUM 8.1* 8.1*    PT/INR: No results found for this basename: LABPROT, INR,  in the last 72 hours ABG    Component Value Date/Time   PHART 7.363 12/16/2013 0407   HCO3 27.1* 12/16/2013 0407   TCO2 29 12/16/2013 0407   O2SAT 93.0 12/16/2013 0407   CBG (last 3)  No results found for this basename: GLUCAP,  in the last 72 hours  Assessment/Plan: S/P Procedure(s) (LRB): VIDEO BRONCHOSCOPY (N/A) VIDEO ASSISTED THORACOSCOPY (VATS)/WEDGE  RESECTION (Left) LOBECTOMY (Left)  1. Chest tube no air leak, on water seal, chest tube with 160 cc output after anterior tube removed, CXR with no pneumothorax 2. Pulm- off oxygen, good use of IS 3. Dispo- patient stable, will d/c final chest tube, PCA, likely home in AM  LOS: 3 days    BARRETT, ERIN 12/18/2013  patient examined and medical record reviewed,agree with above note. VAN TRIGT III,Mumin Denomme 12/18/2013

## 2013-12-19 ENCOUNTER — Inpatient Hospital Stay (HOSPITAL_COMMUNITY): Payer: Medicare Other

## 2013-12-19 DIAGNOSIS — J984 Other disorders of lung: Secondary | ICD-10-CM | POA: Diagnosis not present

## 2013-12-19 DIAGNOSIS — Z4682 Encounter for fitting and adjustment of non-vascular catheter: Secondary | ICD-10-CM | POA: Diagnosis not present

## 2013-12-19 MED ORDER — ENOXAPARIN SODIUM 30 MG/0.3ML ~~LOC~~ SOLN
30.0000 mg | SUBCUTANEOUS | Status: DC
  Administered 2013-12-20: 30 mg via SUBCUTANEOUS
  Filled 2013-12-19: qty 0.3

## 2013-12-19 MED ORDER — CLOPIDOGREL BISULFATE 75 MG PO TABS
75.0000 mg | ORAL_TABLET | Freq: Every day | ORAL | Status: DC
  Administered 2013-12-19 – 2013-12-20 (×2): 75 mg via ORAL
  Filled 2013-12-19 (×3): qty 1

## 2013-12-19 NOTE — Discharge Instructions (Signed)
Video-Assisted Thoracic Surgery °Care After °Refer to this sheet in the next few weeks. These instructions provide you with information on caring for yourself after your procedure. Your caregiver may also give you more specific instructions. Your procedure has been planned according to current medical practices, but problems sometimes occur. Call your caregiver if you have any problems or questions after your procedure. °HOME CARE INSTRUCTIONS  °· Only take over-the-counter or prescription medications as directed. °· Only take pain medications (narcotics) as directed. °· Do not drive until your caregiver approves. Driving while taking narcotics or soon after surgery can be dangerous, so discuss the specific timing with your caregiver. °· Avoid activities that use your chest muscles, such as lifting heavy objects, for at least 3 4 weeks.   °· Take deep breaths to expand the lungs and to protect against pneumonia. °· Do breathing exercises as directed by your caregiver. If you were given an incentive spirometer to help with breathing, use it as directed. °· You may resume a normal diet and activities when you feel you are able to or as directed. °· Do not take a bath until your caregiver says it is OK. Use the shower instead.   °· Keep the bandage (dressing) covering the area where the chest tube was inserted (incision site) dry for 48 hours. After 48 hours, remove the dressing unless there is new drainage. °· Remove dressings as directed by your caregiver. °· Change dressings if necessary or as directed. °· Keep all follow-up appointments. It is important for you to see your caregiver after surgery to discuss appropriate follow-up care and surveillance, if it is necessary. °SEEK MEDICAL CARE: °· You feel excessive or increasing pain at an incision site. °· You notice bleeding, skin irritation, drainage, swelling, or redness at an incision site. °· There is a bad smell coming from an incision or dressing. °· It feels  like your heart is fluttering or beating rapidly. °· Your pain medication does not relieve your pain. °SEEK IMMEDIATE MEDICAL CARE IF:  °· You have a fever.   °· You have chest pain.  °· You have a rash. °· You have shortness of breath. °· You have trouble breathing.   °· You feel weak, lightheaded, dizzy, or faint.   °MAKE SURE YOU:  °· Understand these instructions.   °· Will watch your condition.   °· Will get help right away if you are not doing well or get worse. °Document Released: 02/15/2013 Document Reviewed: 02/15/2013 °Exit[employer]® Patient Information ©2014 [employer]. ° °

## 2013-12-19 NOTE — Progress Notes (Addendum)
      [address]       [place] [address]             [phone]      4 Days Post-Op Procedure(s) (LRB): VIDEO BRONCHOSCOPY (N/A) VIDEO ASSISTED THORACOSCOPY (VATS)/WEDGE RESECTION (Left) LOBECTOMY (Left)  Subjective:  Ms. Stavola is without complaints this morning.  She is bradycardic, but states her heart rate usually runs on the slow side.  She is ambulating + BM  Objective: Vital signs in last 24 hours: Temp:  [97.9 F (36.6 C)-101.3 F (38.5 C)] 98.7 F (37.1 C) (02/15 0800) Pulse Rate:  [50-66] 54 (02/15 0442) Cardiac Rhythm:  [-] Normal sinus rhythm;Sinus bradycardia (02/15 0800) Resp:  [14-19] 14 (02/15 0825) BP: (114-154)/(53-71) 128/53 mmHg (02/15 0953) SpO2:  [90 %-94 %] 91 % (02/15 0442)  Intake/Output from previous day: 02/14 0701 - 02/15 0700 In: 5784 [P.O.:1200; I.V.:220] Out: 1225 [Urine:925; Chest Tube:300] Intake/Output this shift: Total I/O In: 420 [P.O.:360; I.V.:60] Out: 200 [Urine:200]  General appearance: alert, cooperative and no distress Heart: regular rate and rhythm Lungs: clear to auscultation bilaterally Abdomen: soft, non-tender; bowel sounds normal; no masses,  no organomegaly Wound: clean and dry  Lab Results:  Recent Labs  12/17/13 0410  WBC 10.7*  HGB 10.3*  HCT 32.1*  PLT 211   BMET:  Recent Labs  12/17/13 0410  NA 135*  K 4.0  CL 99  CO2 24  GLUCOSE 103*  BUN 29*  CREATININE 1.21*  CALCIUM 8.1*    PT/INR: No results found for this basename: LABPROT, INR,  in the last 72 hours ABG    Component Value Date/Time   PHART 7.363 12/16/2013 0407   HCO3 27.1* 12/16/2013 0407   TCO2 29 12/16/2013 0407   O2SAT 93.0 12/16/2013 0407   CBG (last 3)  No results found for this basename: GLUCAP,  in the last 72 hours  Assessment/Plan: S/P Procedure(s) (LRB): VIDEO BRONCHOSCOPY (N/A) VIDEO ASSISTED THORACOSCOPY (VATS)/WEDGE RESECTION (Left) LOBECTOMY (Left)  1. CV- Sinus Bradycardia, good pressure  control- on Home Bystolic, Norvasc, Avapro, HCTZ- will continue these medications, monitor heart rate today 2. Chest tube- minimal output, no air leak present, will d/c chest tube today 3. Pulm- continue IS 4. Dispo- patient stable, d/c chest tube, watch heart rate if remains stable home in AM   LOS: 4 days    BARRETT, ERIN 12/19/2013  Patient examined and CXR reviewed DC chest tube today Resume Plavix started after recent cardiac catheterization and reduced Lovenox to 30 mg daily patient examined and medical record reviewed,agree with above note. VAN TRIGT III,Afomia Blackley 12/19/2013

## 2013-12-19 NOTE — Discharge Summary (Signed)
Physician Discharge Summary  Patient ID: Susan Pittman MRN: [id number] DOB/AGE: [birth year] 73 y.o.  Admit date: 12/15/2013 Discharge date: 12/19/2013  Admission Diagnoses:  Patient Active Problem List   Diagnosis Date Noted  . Chest pain with moderate risk of acute coronary syndrome 12/12/2013  . Family history of coronary artery bypass surgery 12/12/2013  . Renal artery atherosclerosis, s/p LRA stent 11/08/13 11/08/2013  . Lung nodule/ adenoca > LLobectomy 12/15/13  11/08/2013  . Premature atrial contractions 10/31/2013  . Hearing loss secondary to cerumen impaction 09/20/2013  . Allergic rhinitis 02/11/2013  . Acute bronchitis 02/11/2013  . Shoulder pain 03/11/2012  . Upper arm pain 01/26/2012  . General medical examination 07/25/2011  . Obesity 05/10/2011  . Dizziness 04/10/2011  . Hyperlipidemia 02/28/2011  . EDEMA- LOCALIZED 06/04/2010  . OSTEOPENIA 01/15/2010  . KNEE PAIN 11/10/2009  . CARCINOMA, BASAL CELL 09/22/2009  . SKIN LESION 09/18/2009  . ESOPHAGITIS 05/19/2009  . GASTRIC ULCER 05/19/2009  . HIATAL HERNIA 05/19/2009  . DEGENERATIVE JOINT DISEASE 05/19/2009  . HYPOTHYROIDISM 03/23/2009  . HYPERTENSION 03/23/2009  . ASTHMA 03/23/2009  . GERD 03/23/2009  . COLONIC POLYPS, HX OF 03/23/2009   Discharge Diagnoses:   Patient Active Problem List   Diagnosis Date Noted  . Lung cancer, left lower lobe 12/17/2013  . Status post lobectomy of lung 12/15/2013  . Chest pain with moderate risk of acute coronary syndrome 12/12/2013  . Family history of coronary artery bypass surgery 12/12/2013  . Renal artery atherosclerosis, s/p LRA stent 11/08/13 11/08/2013  . Lung nodule/ adenoca > LLobectomy 12/15/13  11/08/2013  . Premature atrial contractions 10/31/2013  . Hearing loss secondary to cerumen impaction 09/20/2013  . Allergic rhinitis 02/11/2013  . Acute bronchitis 02/11/2013  . Shoulder pain 03/11/2012  . Upper arm pain 01/26/2012  . General medical examination  07/25/2011  . Obesity 05/10/2011  . Dizziness 04/10/2011  . Hyperlipidemia 02/28/2011  . EDEMA- LOCALIZED 06/04/2010  . OSTEOPENIA 01/15/2010  . KNEE PAIN 11/10/2009  . CARCINOMA, BASAL CELL 09/22/2009  . SKIN LESION 09/18/2009  . ESOPHAGITIS 05/19/2009  . GASTRIC ULCER 05/19/2009  . HIATAL HERNIA 05/19/2009  . DEGENERATIVE JOINT DISEASE 05/19/2009  . HYPOTHYROIDISM 03/23/2009  . HYPERTENSION 03/23/2009  . ASTHMA 03/23/2009  . GERD 03/23/2009  . COLONIC POLYPS, HX OF 03/23/2009   Discharged Condition: good  History of Present Illness:   [name] is a 73 yo white female referred to [facility] for an incidental finding of a 2.2 cm pulmonary nodule of the left lower lobe during a workup for renal artery stenosis.  She has never smoked and denies exposure to occupational exposures.  She does visit [place] for 2-4 month time frame.  During that time she noted sinus drainage and a deep cough.  She also admitted being exposed to "dust storms" during her visit.  She was evaluated by Dr. Servando Snare on 12/01/2013 at which time he recommended the patient undergo CT guided needle biopsy of the lesion.  The patient is on Plavix for a Renal Artery stent and would need to stop this medication prior to proceeding with surgery.  She was going to speak with Dr. Gwenlyn Found, have PFTs completed and then decide if she wishes to proceed with intervention.  She again presented to Dr. Everrett Coombe office on 12/09/13 at which time the risks and benefits of a needle biopsy vs. VATS procedure were explained to the patient.  She was agreeable to proceed with a VATS procedure.  This was scheduled for 12/15/13.  The patient did present to the ED on 12/12/2013 with complaints of chest pain.  She was ruled out for MI.  Cardiac catheterization was performed and showed mild evidence of obstructive CAD.         Hospital Course:   She presented to [facility] on 12/15[facility]2013.  She underwent Bronchoscopy, Left VATS with wedge resection of  the left lower lobe.  Surgical pathology reported this to be a cancerous nodule.  Therefore, a MINI thoracotomy was performed and the remaining portion of her left lower lobe was removed.  She also underwent lymph node sampling.  She tolerated the procedure, was extubated, and taken to the SICU in stable condition.  The patient did well post operatively.  Her chest tube did not show evidence of air leak and was transitioned to water seal on POD #1.  Her arterial line was removed without difficulty.  She was medically stable and transferred to the step down unit in stable condition.    The patient's chest tube continued to show no evidence of air leak.  Her anterior chest tube was removed without difficulty.  Follow up CXR obtained the next morning was stable and free of pneumothorax.  Her final chest tube was removed without difficulty.  Patient was suffering from Bradycardia with rates as low as 54.  She will be observed for 24 hours and if necessary we will decrease her home dose of Bystolic.  Follow up CXR will be obtained in the morning.  Should no new issues arise we anticipate discharge home in the next 24-48 hours.  She will follow up with Dr. Servando Snare in 2 weeks with a CXR prior to her procedure. Final pathology confirms the presence of Invasive Adenocarcinoma and the report is pasted below.   Significant Diagnostic Studies: Pathology  1. Lung, wedge biopsy/resection, Left lower lobe - INVASIVE ADENOCARCINOMA SEE COMMENT. - TUMOR INVOLVES SURGICAL MARGIN. - NO LYMPHOVASCULAR INVASION IDENTIFIED. - SEE TUMOR SYNOPTIC TEMPLATE BELOW. 2. Lymph node, biopsy, 8 node - ONE LYMPH NODE, NEGATIVE FOR TUMOR (0/1), SEE COMMENT. 3. Lymph node, biopsy, 11 L - ONE LYMPH NODE, NEGATIVE FOR TUMOR (0/1). 4. Lung, resection (segmental or lobe), Left lower - BENIGN LUNG, SEE COMMENT. - NEGATIVE FOR ATYPIA OR MALIGNANCY. - BRONCHOVASCULAR MARGIN, NEGATIVE FOR ATYPIA OR MALIGNANCY. 5. Lymph node, biopsy, 10  node - ONE LYMPH NODE, NEGATIVE FOR TUMOR (0/1). 6. Lymph node, biopsy, 12 node - ONE LYMPH NODE, NEGATIVE FOR TUMOR (0/1).  Treatments: surgery:   Bronchoscopy, left video-assisted thoracoscopy, with wedge resection for biopsy of left lower lobe lesion, completion  left lower lobectomy, lymph node dissection, placement of On-Q device  Disposition: 01-Home or Self Care  Discharge Medications:     Medication List         albuterol 108 (90 BASE) MCG/ACT inhaler  Commonly known as:  PROVENTIL HFA;VENTOLIN HFA  Inhale 1-2 puffs into the lungs every 6 (six) hours as needed for wheezing or shortness of breath.     amLODipine 10 MG tablet  Commonly known as:  NORVASC  Take 10 mg by mouth daily.     BYSTOLIC 20 MG Tabs  Generic drug:  Nebivolol HCl  Take 20 mg by mouth every morning.     Calcium 600-200 MG-UNIT per tablet  Take 1 tablet by mouth 2 (two) times daily. Takes at [facility] and Evening     clopidogrel 75 MG tablet  Commonly known as:  PLAVIX  Take 1 tablet (75 mg total) by  mouth daily with breakfast.     etodolac 400 MG tablet  Commonly known as:  LODINE  Take 400 mg by mouth 2 (two) times daily.     fluticasone 50 MCG/ACT nasal spray  Commonly known as:  FLONASE  Place 2 sprays into both nostrils daily as needed for allergies or rhinitis.     lansoprazole 15 MG capsule  Commonly known as:  PREVACID  Take 15 mg by mouth daily.     levothyroxine 88 MCG tablet  Commonly known as:  SYNTHROID, LEVOTHROID  Take 88 mcg by mouth daily before breakfast.     MULTI FOR HER 50+ PO  Take 1 tablet by mouth daily.     oxyCODONE-acetaminophen 5-325 MG per tablet  Commonly known as:  PERCOCET/ROXICET  Take 1-2 tablets by mouth every 4 (four) hours as needed for severe pain.     valsartan-hydrochlorothiazide 320-25 MG per tablet  Commonly known as:  DIOVAN-HCT  Take 1 tablet by mouth daily.     ZYRTEC 10 MG tablet  Generic drug:  cetirizine  Take 10 mg by mouth at  bedtime.          Future Appointments Provider Department Dept Phone   12/28/2013 11:00 AM Brittainy Ladoris Gene [facility] [phone]   01/03/2014 2:10 PM Tommy Medal, RPH-CPP [facility] [phone]   03/03/2014 1:30 PM Troy Sine, MD [place] [phone]   03/16/2014 9:00 AM [name], MD [place] at  [place]         Follow-up Information   Follow up with GERHARDT,EDWARD B, MD In 2 weeks. (Office will contact you with appointment)    Specialty:  Cardiothoracic Surgery   Contact information:   [place] [address] [phone]       Follow up with [facility] In 2 weeks. (Please get a CXR 1 hour prior to your appointment with Dr. Servando Snare)    Contact information:          Signed: Makana Feigel 12/19/2013, 11:57 AM

## 2013-12-20 ENCOUNTER — Inpatient Hospital Stay (HOSPITAL_COMMUNITY): Payer: Medicare Other

## 2013-12-20 DIAGNOSIS — Z4682 Encounter for fitting and adjustment of non-vascular catheter: Secondary | ICD-10-CM | POA: Diagnosis not present

## 2013-12-20 DIAGNOSIS — J9819 Other pulmonary collapse: Secondary | ICD-10-CM | POA: Diagnosis not present

## 2013-12-20 MED ORDER — OXYCODONE-ACETAMINOPHEN 5-325 MG PO TABS: 1.0000 | ORAL_TABLET | ORAL | Status: DC | PRN

## 2013-12-20 MED ORDER — CLOPIDOGREL BISULFATE 75 MG PO TABS: 75.0000 mg | ORAL_TABLET | Freq: Every day | ORAL | Status: DC

## 2013-12-20 NOTE — Progress Notes (Signed)
[name] to be D/C'd Home per MD order.  Discussed with the patient and all questions fully answered.    Medication List         albuterol 108 (90 BASE) MCG/ACT inhaler  Commonly known as:  PROVENTIL HFA;VENTOLIN HFA  Inhale 1-2 puffs into the lungs every 6 (six) hours as needed for wheezing or shortness of breath.     amLODipine 10 MG tablet  Commonly known as:  NORVASC  Take 10 mg by mouth daily.     BYSTOLIC 20 MG Tabs  Generic drug:  Nebivolol HCl  Take 20 mg by mouth every morning.     Calcium 600-200 MG-UNIT per tablet  Take 1 tablet by mouth 2 (two) times daily. Takes at [facility] and Evening     clopidogrel 75 MG tablet  Commonly known as:  PLAVIX  Take 1 tablet (75 mg total) by mouth daily with breakfast.     etodolac 400 MG tablet  Commonly known as:  LODINE  Take 400 mg by mouth 2 (two) times daily.     fluticasone 50 MCG/ACT nasal spray  Commonly known as:  FLONASE  Place 2 sprays into both nostrils daily as needed for allergies or rhinitis.     lansoprazole 15 MG capsule  Commonly known as:  PREVACID  Take 15 mg by mouth daily.     levothyroxine 88 MCG tablet  Commonly known as:  SYNTHROID, LEVOTHROID  Take 88 mcg by mouth daily before breakfast.     MULTI FOR HER 50+ PO  Take 1 tablet by mouth daily.     oxyCODONE-acetaminophen 5-325 MG per tablet  Commonly known as:  PERCOCET/ROXICET  Take 1-2 tablets by mouth every 4 (four) hours as needed for severe pain.     valsartan-hydrochlorothiazide 320-25 MG per tablet  Commonly known as:  DIOVAN-HCT  Take 1 tablet by mouth daily.     ZYRTEC 10 MG tablet  Generic drug:  cetirizine  Take 10 mg by mouth at bedtime.        VVS, Skin clean, dry and intact without evidence of skin break down, no evidence of skin tears noted. IV catheter discontinued intact. Site without signs and symptoms of complications. Dressing and pressure applied.  An After Visit Summary was printed and given to the  patient.  D/c education completed with patient/family including follow up instructions, medication list, d/c activities limitations if indicated, with other d/c instructions as indicated by MD - patient able to verbalize understanding, all questions fully answered.   Patient instructed to return to ED, call 911, or call MD for any changes in condition.   Patient escorted via [place], and D/C home via private auto.  [facility] 12/20/2013 10:17 AM

## 2013-12-20 NOTE — Progress Notes (Addendum)
      [address]       [place] [address]             [phone]      5 Days Post-Op Procedure(s) (LRB): VIDEO BRONCHOSCOPY (N/A) VIDEO ASSISTED THORACOSCOPY (VATS)/WEDGE RESECTION (Left) LOBECTOMY (Left)  Subjective:  Ms. Passon has no complaints this morning.  She is ready to go home.  She is ambulating with minimal difficulty. + BM  Objective: Vital signs in last 24 hours: Temp:  [97.4 F (36.3 C)-98.7 F (37.1 C)] 97.4 F (36.3 C) (02/16 0720) Pulse Rate:  [55-66] 55 (02/16 0720) Cardiac Rhythm:  [-] Normal sinus rhythm;Sinus bradycardia (02/16 0720) Resp:  [12-21] 12 (02/16 0720) BP: (99-153)/(50-99) 99/50 mmHg (02/16 0720) SpO2:  [96 %-98 %] 98 % (02/16 0720)  Intake/Output from previous day: 02/15 0701 - 02/16 0700 In: 900 [P.O.:840; I.V.:60] Out: 500 [Urine:500] Intake/Output this shift: Total I/O In: 240 [P.O.:240] Out: -   General appearance: alert, cooperative and no distress Heart: regular rate and rhythm Lungs: clear to auscultation bilaterally Abdomen: soft, non-tender; bowel sounds normal; no masses,  no organomegaly Extremities: extremities normal, atraumatic, no cyanosis or edema Wound: clean and dry  Lab Results: No results found for this basename: WBC, HGB, HCT, PLT,  in the last 72 hours BMET: No results found for this basename: NA, K, CL, CO2, GLUCOSE, BUN, CREATININE, CALCIUM,  in the last 72 hours  PT/INR: No results found for this basename: LABPROT, INR,  in the last 72 hours ABG    Component Value Date/Time   PHART 7.363 12/16/2013 0407   HCO3 27.1* 12/16/2013 0407   TCO2 29 12/16/2013 0407   O2SAT 93.0 12/16/2013 0407   CBG (last 3)  No results found for this basename: GLUCAP,  in the last 72 hours  Assessment/Plan: S/P Procedure(s) (LRB): VIDEO BRONCHOSCOPY (N/A) VIDEO ASSISTED THORACOSCOPY (VATS)/WEDGE RESECTION (Left) LOBECTOMY (Left)  1. Chest tube- remove yesterday, no evidence of pneumothorax 2.  CV-remains bradycardic but HR is stable, will continue home medications 3. Dispo- will d/c patient home today   LOS: 5 days    BARRETT, ERIN 12/20/2013  patient doing well with chest tubes out chest xray stable Plan d/c today Back on plavix I have seen and examined [name] and agree with the above assessment  and plan.  Grace Isaac MD [facility] [phone] Office [phone] 12/20/2013 9:28 AM

## 2013-12-23 ENCOUNTER — Telehealth: Payer: Self-pay

## 2013-12-23 NOTE — Telephone Encounter (Signed)
The patient called and stated her blood pressure medication was too expensive to pick up at the pharmacy.  She is hoping to receive samples of the medication if available.   Thanks!   Callback - [phone]

## 2013-12-23 NOTE — Telephone Encounter (Signed)
Spoke with pt stated that her Bystolic is to expensive has an appt with cards on 01/04/14. Gave pt enough samples to get her through the appt.

## 2013-12-28 ENCOUNTER — Ambulatory Visit
Admission: RE | Admit: 2013-12-28 | Discharge: 2013-12-28 | Disposition: A | Discharge status: Home / Self Care | Payer: Medicare Other | Source: Ambulatory Visit | Attending: Cardiothoracic Surgery | Admitting: Cardiothoracic Surgery

## 2013-12-28 ENCOUNTER — Ambulatory Visit: Payer: Medicare Other | Admitting: Cardiology

## 2013-12-28 ENCOUNTER — Other Ambulatory Visit: Payer: Self-pay | Admitting: Cardiothoracic Surgery

## 2013-12-28 ENCOUNTER — Other Ambulatory Visit: Payer: Self-pay | Admitting: *Deleted

## 2013-12-28 ENCOUNTER — Ambulatory Visit (INDEPENDENT_AMBULATORY_CARE_PROVIDER_SITE_OTHER): Payer: Self-pay | Admitting: Cardiothoracic Surgery

## 2013-12-28 VITALS — BP 150/77 | HR 60 | Resp 20 | Ht 65.0 in | Wt 244.0 lb

## 2013-12-28 DIAGNOSIS — C3490 Malignant neoplasm of unspecified part of unspecified bronchus or lung: Secondary | ICD-10-CM

## 2013-12-28 DIAGNOSIS — R079 Chest pain, unspecified: Secondary | ICD-10-CM | POA: Diagnosis not present

## 2013-12-28 DIAGNOSIS — R05 Cough: Secondary | ICD-10-CM | POA: Diagnosis not present

## 2013-12-28 DIAGNOSIS — Z9889 Other specified postprocedural states: Secondary | ICD-10-CM

## 2013-12-28 DIAGNOSIS — Z902 Acquired absence of lung [part of]: Secondary | ICD-10-CM

## 2013-12-28 DIAGNOSIS — R059 Cough, unspecified: Secondary | ICD-10-CM | POA: Diagnosis not present

## 2013-12-28 DIAGNOSIS — T8140XA Infection following a procedure, unspecified, initial encounter: Secondary | ICD-10-CM | POA: Diagnosis not present

## 2013-12-28 MED ORDER — CEPHALEXIN 500 MG PO CAPS: 500.0000 mg | ORAL_CAPSULE | Freq: Three times a day (TID) | ORAL | Status: DC

## 2013-12-28 MED ORDER — HYDROCOD POLST-CHLORPHEN POLST 10-8 MG/5ML PO LQCR: 5.0000 mL | Freq: Two times a day (BID) | ORAL | Status: DC | PRN

## 2013-12-28 MED ORDER — TRAMADOL HCL 50 MG PO TABS: 50.0000 mg | ORAL_TABLET | Freq: Four times a day (QID) | ORAL | Status: DC | PRN

## 2013-12-28 NOTE — Patient Instructions (Signed)
Clean chest tube site as shown in office You have scripts for keflex, Ultram and cough medicine Take temp twice a day

## 2013-12-28 NOTE — Progress Notes (Signed)
[address]       [place] [address]             [phone]                  Akina L Cafarella [name] Medical Record #[id number] Date of Birth: [birth year]  Referring TW:SFKC, Christena Deem, MD Primary Cardiology:Dr Gwenlyn Found Primary Care:Katherine [name], MD  Chief Complaint:   PostOp Follow Up Visit 12/15/2013  DATE OF DISCHARGE:  OPERATIVE REPORT  PREOPERATIVE DIAGNOSIS: Left lower lobe lung mass.  POSTOPERATIVE DIAGNOSIS: Left lower lobe lung mass. Adenocarcinoma by  frozen section.  PROCEDURE PERFORMED: Bronchoscopy, left video-assisted thoracoscopy,  with wedge resection for biopsy of left lower lobe lesion, completion  left lower lobectomy, lymph node dissection, placement of On-Q device.  SURGEON: Lanelle Bal, MD      Diagnosis: GMS stain demonstrates abundant microorganisms present within the hyalinized nodules with morphology consistent with Histoplasmosis capsulatum. 1. Lung, wedge biopsy/resection, Left lower lobe - INVASIVE ADENOCARCINOMA SEE COMMENT. - TUMOR INVOLVES SURGICAL MARGIN. - NO LYMPHOVASCULAR INVASION IDENTIFIED. - SEE TUMOR SYNOPTIC TEMPLATE BELOW. 2. Lymph node, biopsy, 8 node - ONE LYMPH NODE, NEGATIVE FOR TUMOR (0/1), SEE COMMENT. 3. Lymph node, biopsy, 11 L - ONE LYMPH NODE, NEGATIVE FOR TUMOR (0/1). 4. Lung, resection (segmental or lobe), Left lower - BENIGN LUNG, SEE COMMENT. - NEGATIVE FOR ATYPIA OR MALIGNANCY. - BRONCHOVASCULAR MARGIN, NEGATIVE FOR ATYPIA OR MALIGNANCY. 5. Lymph node, biopsy, 10 node - ONE LYMPH NODE, NEGATIVE FOR TUMOR (0/1). 6. Lymph node, biopsy, 12 node - ONE LYMPH NODE, NEGATIVE FOR TUMOR (0/1). Microscopic Comment 1. LUNG 1 of 3 FINAL for Susan, Pittman ([id number]) Microscopic Comment(continued) Specimen, including laterality: Left lower lobe (parts 1 and 4). Procedure: Wedge resection and lobectomy Specimen integrity (intact/disrupted): Intact Tumor site: Subpleural Maximum  tumor size (cm): 1.7 cm Histologic type: Adenocarcinoma (acinar and lepidic subtypes) Grade: I/well differentiated Margins: Present at margins, see comment. Distance to closest margin (cm): Present at margin Visceral pleura invasion: Absent Tumor extension: Tumor is confined to subpleural parenchyma Treatment effect (if treated with neoadjuvant therapy): None Lymph -Vascular invasion: Absent Lymph nodes: Number examined - 4; Number N1 nodes positive - 0; Number N2 nodes positive - 0 TNM code: pT1a, pN0, pMX Ancillary studies: Can be performed upon request Non-neoplastic lung: See part 4. Comments: The final surgical margin is part 4. 2. Sections of lymph node demonstrate multiple foci of extensively hyalinized and calcified intranodal nodules. AFB, PAS and GMS stains are pending and will be reported in an addendum. 4. There is no tumor grossly identified. Representative sections of the lung demonstrate non-neoplastic findings to include minimal chronic interstitial inflammation with rare epithelioid granuloma, anthracotic pigment deposition, minimal peribronchiolar chronic inflammation, Langerhans cell histiocytic nodules, and an incidental 3 mm subpleural minute pulmonary meningothelial-like nodule (chemodectoma). The chemodectoma is an incidental, benign finding of no prognostic significance. There are no features of epithelial dysplasia or malignancy present. (CR:kh 12-16-13) Susan RUND DO  History of Present Illness:     Patient returns to the office today after left lower lobectomy for stage I adenocarcinoma the lung. She noted increasing discomfort on the left chest and drainage from one chest tube site and has had some increase in cough. She denies any fever chills. She has been ambulating around the house but not going out.     History  Smoking status  . Never Smoker   Smokeless tobacco  . Never Used  Allergies  Allergen Reactions  . Benazepril Hcl Anaphylaxis and  Cough  . Loratadine Anaphylaxis    anaphylaxis  . Celecoxib     REACTION: aching  . Lisinopril     REACTION: cough  . Allegra [Fexofenadine Hcl]     Severe back pain  . Sulfa Antibiotics Hives    Current Outpatient Prescriptions  Medication Sig Dispense Refill  . albuterol (PROVENTIL HFA;VENTOLIN HFA) 108 (90 BASE) MCG/ACT inhaler Inhale 1-2 puffs into the lungs every 6 (six) hours as needed for wheezing or shortness of breath.      Marland Kitchen amLODipine (NORVASC) 10 MG tablet Take 10 mg by mouth daily.      . Calcium 600-200 MG-UNIT per tablet Take 1 tablet by mouth 2 (two) times daily. Takes at [employer]      . cetirizine (ZYRTEC) 10 MG tablet Take 10 mg by mouth at bedtime.       . clopidogrel (PLAVIX) 75 MG tablet Take 1 tablet (75 mg total) by mouth daily with breakfast.      . etodolac (LODINE) 400 MG tablet Take 400 mg by mouth 2 (two) times daily.      . fluticasone (FLONASE) 50 MCG/ACT nasal spray Place 2 sprays into both nostrils daily as needed for allergies or rhinitis.      Marland Kitchen lansoprazole (PREVACID) 15 MG capsule Take 15 mg by mouth daily.        Marland Kitchen levothyroxine (SYNTHROID, LEVOTHROID) 88 MCG tablet Take 88 mcg by mouth daily before breakfast.      . Multiple Vitamins-Minerals (MULTI FOR HER 50+ PO) Take 1 tablet by mouth daily.       . Nebivolol HCl (BYSTOLIC) 20 MG TABS Take 20 mg by mouth every morning.      Marland Kitchen oxyCODONE-acetaminophen (PERCOCET/ROXICET) 5-325 MG per tablet Take 1-2 tablets by mouth every 4 (four) hours as needed for severe pain.  30 tablet  0  . valsartan-hydrochlorothiazide (DIOVAN-HCT) 320-25 MG per tablet Take 1 tablet by mouth daily.      . cephALEXin (KEFLEX) 500 MG capsule Take 1 capsule (500 mg total) by mouth 3 (three) times daily.  21 capsule  0  . chlorpheniramine-HYDROcodone (TUSSIONEX) 10-8 MG/5ML LQCR Take 5 mLs by mouth every 12 (twelve) hours as needed for cough.  480 mL  0  . traMADol (ULTRAM) 50 MG tablet Take 1 tablet (50 mg total) by mouth  every 6 (six) hours as needed for moderate pain.  40 tablet  0   No current facility-administered medications for this visit.       Physical Exam: BP 150/77  Pulse 60  Resp 20  Ht 5\' 5"  (1.651 m)  Wt 244 lb (110.678 kg)  BMI 40.60 kg/m2  SpO2 96%  General appearance: alert and cooperative Neurologic: intact Heart: regular rate and rhythm, S1, S2 normal, no murmur, click, rub or gallop Lungs: diminished breath sounds LLL Abdomen: soft, non-tender; bowel sounds normal; no masses,  no organomegaly Extremities: extremities normal, atraumatic, no cyanosis or edema and Homans sign is negative, no sign of DVT Wound: The larger incision and anterior chest tube site are well-healed. This brown purulent material draining from the posterior most chest tube site. The sutures at chest tube sites were removed, cultures were obtained from the posterior chest tube site  Diagnostic Studies & Laboratory data:         Recent Radiology Findings: Dg Chest 2 View  12/28/2013   CLINICAL DATA:  History of left-sided lung  malignancy status post recent surgery. Now with dyspnea and cough and chest pain  EXAM: CHEST  2 VIEW  COMPARISON:  DG CHEST 2 VIEW dated 12/20/2013  FINDINGS: The right lung is adequately inflated and clear. On the left there is volume loss in the mid and lower hemithorax that is slightly more conspicuous today. The left hemidiaphragm remains obscured. Soft tissue fullness in the left hilar region is present. There is an air-fluid level in the medial aspect of the anterior portion of the left hemithorax superiorly that is slightly smaller than on the earlier study. There is no shift of the midline. Hilar fullness on the right is present and stable. The observed portions of the bony thorax appear normal.  IMPRESSION: 1. Slightly increased volume loss on the left is present which may reflect infiltrate or postobstructive atelectasis. A small loculated left-sided hydropneumothorax versus cavitary  lesion with an air-fluid level is present in the anterior superior aspect of the left upper lobe. It is has decreased in size since the previous study. 2. A small pleural effusion at the right lung base is suspected. 3. There is no evidence of CHF. 4. Chest CT scanning may be of value.   Electronically Signed   By: David  Martinique   On: 12/28/2013 14:12      Recent Labs: Lab Results  Component Value Date   WBC 10.7* 12/17/2013   HGB 10.3* 12/17/2013   HCT 32.1* 12/17/2013   PLT 211 12/17/2013   GLUCOSE 103* 12/17/2013   CHOL 180 10/13/2013   TRIG 105 10/13/2013   HDL 52 10/13/2013   LDLDIRECT 126.3 09/10/2012   LDLCALC 107* 10/13/2013   ALT 17 12/17/2013   AST 22 12/17/2013   NA 135* 12/17/2013   K 4.0 12/17/2013   CL 99 12/17/2013   CREATININE 1.21* 12/17/2013   BUN 29* 12/17/2013   CO2 24 12/17/2013   TSH 3.158 10/13/2013   INR 1.06 12/13/2013      Assessment / Plan:     Superficial infection of posterior chest tube site, culture results pending, the patient is been started on Keflex 500 mg by mouth every 8 hours and local wound care. In addition she has been given cough medication. She was discharged home on oxycodone but has not been taking it because she said it was "too strong" she has been given a prescription for Ultram to try in place of oxycodone. Her husband was instructed in local wound care She will return on Thursday for followup visit and wound check, at that time we should have the culture results back.     Brahm Barbeau B 12/28/2013 4:01 PM

## 2013-12-29 ENCOUNTER — Ambulatory Visit: Payer: Medicare Other | Admitting: Physician Assistant

## 2013-12-29 ENCOUNTER — Other Ambulatory Visit: Payer: Self-pay | Admitting: *Deleted

## 2013-12-29 DIAGNOSIS — C3490 Malignant neoplasm of unspecified part of unspecified bronchus or lung: Secondary | ICD-10-CM

## 2013-12-30 ENCOUNTER — Encounter: Payer: Self-pay | Admitting: Cardiothoracic Surgery

## 2013-12-30 ENCOUNTER — Ambulatory Visit (INDEPENDENT_AMBULATORY_CARE_PROVIDER_SITE_OTHER): Payer: Self-pay | Admitting: Cardiothoracic Surgery

## 2013-12-30 ENCOUNTER — Ambulatory Visit
Admission: RE | Admit: 2013-12-30 | Discharge: 2013-12-30 | Disposition: A | Discharge status: Home / Self Care | Payer: Medicare Other | Source: Ambulatory Visit | Attending: Cardiothoracic Surgery | Admitting: Cardiothoracic Surgery

## 2013-12-30 VITALS — BP 128/66 | HR 62 | Resp 16 | Ht 65.0 in | Wt 244.0 lb

## 2013-12-30 DIAGNOSIS — C3490 Malignant neoplasm of unspecified part of unspecified bronchus or lung: Secondary | ICD-10-CM

## 2013-12-30 DIAGNOSIS — Z902 Acquired absence of lung [part of]: Secondary | ICD-10-CM

## 2013-12-30 DIAGNOSIS — T8149XA Infection following a procedure, other surgical site, initial encounter: Secondary | ICD-10-CM

## 2013-12-30 DIAGNOSIS — Z9889 Other specified postprocedural states: Secondary | ICD-10-CM

## 2013-12-30 DIAGNOSIS — T8140XA Infection following a procedure, unspecified, initial encounter: Secondary | ICD-10-CM

## 2013-12-30 DIAGNOSIS — C3430 Malignant neoplasm of lower lobe, unspecified bronchus or lung: Secondary | ICD-10-CM

## 2013-12-30 MED ORDER — HYDROCOD POLST-CHLORPHEN POLST 10-8 MG/5ML PO LQCR: 5.0000 mL | Freq: Two times a day (BID) | ORAL | Status: DC | PRN

## 2013-12-30 NOTE — Progress Notes (Signed)
[address]       [place] [address]             [phone]                  Krishna L Turri [place] Medical Record #[id number] Date of Birth: 03/26/1941  Referring OF:BPZW, Christena Deem, MD Primary Cardiology:Dr Gwenlyn Found Primary Care:Katherine [name], MD  Chief Complaint:   PostOp Follow Up Visit 12/15/2013  OPERATIVE REPORT  PREOPERATIVE DIAGNOSIS: Left lower lobe lung mass.  POSTOPERATIVE DIAGNOSIS: Left lower lobe lung mass. Adenocarcinoma by  frozen section.  PROCEDURE PERFORMED: Bronchoscopy, left video-assisted thoracoscopy,  with wedge resection for biopsy of left lower lobe lesion, completion  left lower lobectomy, lymph node dissection, placement of On-Q device.  SURGEON: Lanelle Bal, MD   Diagnosis: GMS stain demonstrates abundant microorganisms present within the hyalinized nodules with morphology consistent with Histoplasmosis capsulatum. 1. Lung, wedge biopsy/resection, Left lower lobe - INVASIVE ADENOCARCINOMA SEE COMMENT. - TUMOR INVOLVES SURGICAL MARGIN. - NO LYMPHOVASCULAR INVASION IDENTIFIED. - SEE TUMOR SYNOPTIC TEMPLATE BELOW. 2. Lymph node, biopsy, 8 node - ONE LYMPH NODE, NEGATIVE FOR TUMOR (0/1), SEE COMMENT. 3. Lymph node, biopsy, 11 L - ONE LYMPH NODE, NEGATIVE FOR TUMOR (0/1). 4. Lung, resection (segmental or lobe), Left lower - BENIGN LUNG, SEE COMMENT. - NEGATIVE FOR ATYPIA OR MALIGNANCY. - BRONCHOVASCULAR MARGIN, NEGATIVE FOR ATYPIA OR MALIGNANCY. 5. Lymph node, biopsy, 10 node - ONE LYMPH NODE, NEGATIVE FOR TUMOR (0/1). 6. Lymph node, biopsy, 12 node - ONE LYMPH NODE, NEGATIVE FOR TUMOR (0/1). Microscopic Comment 1. LUNG 1 of 3 FINAL for Susan Pittman, Susan Pittman ([id number]) Microscopic Comment(continued) Specimen, including laterality: Left lower lobe (parts 1 and 4). Procedure: Wedge resection and lobectomy Specimen integrity (intact/disrupted): Intact Tumor site: Subpleural Maximum tumor size (cm): 1.7  cm Histologic type: Adenocarcinoma (acinar and lepidic subtypes) Grade: I/well differentiated Margins: Present at margins, see comment. Distance to closest margin (cm): Present at margin Visceral pleura invasion: Absent Tumor extension: Tumor is confined to subpleural parenchyma Treatment effect (if treated with neoadjuvant therapy): None Lymph -Vascular invasion: Absent Lymph nodes: Number examined - 4; Number N1 nodes positive - 0; Number N2 nodes positive - 0 TNM code: pT1a, pN0, pMX Ancillary studies: Can be performed upon request Non-neoplastic lung: See part 4. Comments: The final surgical margin is part 4. 2. Sections of lymph node demonstrate multiple foci of extensively hyalinized and calcified intranodal nodules. AFB, PAS and GMS stains are pending and will be reported in an addendum. 4. There is no tumor grossly identified. Representative sections of the lung demonstrate non-neoplastic findings to include minimal chronic interstitial inflammation with rare epithelioid granuloma, anthracotic pigment deposition, minimal peribronchiolar chronic inflammation, Langerhans cell histiocytic nodules, and an incidental 3 mm subpleural minute pulmonary meningothelial-like nodule (chemodectoma). The chemodectoma is an incidental, benign finding of no prognostic significance. There are no features of epithelial dysplasia or malignancy present. (CR:kh 12-16-13) [place] RUND DO  History of Present Illness:     Patient returns to the office today after left lower lobectomy for stage I adenocarcinoma the lung. Patient feels much better today, no fever or chills, cough has improved. Staph aureus grew on the cultures done 2 days ago, sensitivity is still pending. Patient remains on Keflex pending sensitivities   History  Smoking status  . Never Smoker   Smokeless tobacco  . Never Used       Allergies  Allergen Reactions  . Benazepril Hcl Anaphylaxis  and Cough  . Loratadine Anaphylaxis     anaphylaxis  . Celecoxib     REACTION: aching  . Lisinopril     REACTION: cough  . Allegra [Fexofenadine Hcl]     Severe back pain  . Sulfa Antibiotics Hives    Current Outpatient Prescriptions  Medication Sig Dispense Refill  . albuterol (PROVENTIL HFA;VENTOLIN HFA) 108 (90 BASE) MCG/ACT inhaler Inhale 1-2 puffs into the lungs every 6 (six) hours as needed for wheezing or shortness of breath.      Marland Kitchen amLODipine (NORVASC) 10 MG tablet Take 10 mg by mouth daily.      . Calcium 600-200 MG-UNIT per tablet Take 1 tablet by mouth 2 (two) times daily. Takes at [employer]      . cephALEXin (KEFLEX) 500 MG capsule Take 1 capsule (500 mg total) by mouth 3 (three) times daily.  21 capsule  0  . cetirizine (ZYRTEC) 10 MG tablet Take 10 mg by mouth at bedtime.       . chlorpheniramine-HYDROcodone (TUSSIONEX) 10-8 MG/5ML LQCR Take 5 mLs by mouth every 12 (twelve) hours as needed for cough.  60 mL  0  . clopidogrel (PLAVIX) 75 MG tablet Take 1 tablet (75 mg total) by mouth daily with breakfast.      . etodolac (LODINE) 400 MG tablet Take 400 mg by mouth 2 (two) times daily.      . fluticasone (FLONASE) 50 MCG/ACT nasal spray Place 2 sprays into both nostrils daily as needed for allergies or rhinitis.      Marland Kitchen lansoprazole (PREVACID) 15 MG capsule Take 15 mg by mouth daily.        Marland Kitchen levothyroxine (SYNTHROID, LEVOTHROID) 88 MCG tablet Take 88 mcg by mouth daily before breakfast.      . Multiple Vitamins-Minerals (MULTI FOR HER 50+ PO) Take 1 tablet by mouth daily.       . Nebivolol HCl (BYSTOLIC) 20 MG TABS Take 20 mg by mouth every morning.      . traMADol (ULTRAM) 50 MG tablet Take 1 tablet (50 mg total) by mouth every 6 (six) hours as needed for moderate pain.  40 tablet  0  . valsartan-hydrochlorothiazide (DIOVAN-HCT) 320-25 MG per tablet Take 1 tablet by mouth daily.       No current facility-administered medications for this visit.       Physical Exam: BP 128/66  Pulse 62  Resp 16  Ht  5\' 5"  (1.651 m)  Wt 244 lb (110.678 kg)  BMI 40.60 kg/m2  SpO2 98%  General appearance: alert and cooperative Neurologic: intact Heart: regular rate and rhythm, S1, S2 normal, no murmur, click, rub or gallop Lungs: diminished breath sounds LLL Abdomen: soft, non-tender; bowel sounds normal; no masses,  no organomegaly Extremities: extremities normal, atraumatic, no cyanosis or edema and Homans sign is negative, no sign of DVT Wound: The larger incision and anterior chest tube site are well-healed. This brown purulent material draining from the posterior most chest tube site. The sutures at chest tube sites were removed, cultures were obtained from the posterior chest tube site  Diagnostic Studies & Laboratory data:         Recent Radiology Findings: Dg Chest 2 View  12/30/2013   CLINICAL DATA:  Cough, low-grade fever, Mild dyspnea.  Lung cancer.  EXAM: CHEST  2 VIEW  COMPARISON:  Chest radiographs dated 12/28/2013. PET-CT dated 11/23/2013.  FINDINGS: Patchy left lower lobe opacity, suspicious for pneumonia. Associated small to moderate left pleural  effusion.  Again noted is an air-fluid level in the anterior left upper lobe, possibly reflecting a loculated hydropneumothorax or a cavitary lesion with layering fluid, unchanged.  Right lung is clear.  No pneumothorax.  The heart is normal in size.  Mild degenerative changes of the visualized thoracolumbar spine. Cervical spine fixation hardware.  Cholecystectomy clips.  IMPRESSION: Patchy left lower lobe opacity, suspicious for pneumonia. Associated small to moderate left pleural effusion.  Stable air-fluid level in the anterior left upper lobe, possibly reflecting a loculated hydropneumothorax or a cavitary lesion with layering fluid.   Electronically Signed   By: Julian Hy M.D.   On: 12/30/2013 14:02      Recent Labs: Lab Results  Component Value Date   WBC 10.7* 12/17/2013   HGB 10.3* 12/17/2013   HCT 32.1* 12/17/2013   PLT 211  12/17/2013   GLUCOSE 103* 12/17/2013   CHOL 180 10/13/2013   TRIG 105 10/13/2013   HDL 52 10/13/2013   LDLDIRECT 126.3 09/10/2012   LDLCALC 107* 10/13/2013   ALT 17 12/17/2013   AST 22 12/17/2013   NA 135* 12/17/2013   K 4.0 12/17/2013   CL 99 12/17/2013   CREATININE 1.21* 12/17/2013   BUN 29* 12/17/2013   CO2 24 12/17/2013   TSH 3.158 10/13/2013   INR 1.06 12/13/2013      Assessment / Plan:   Superficial infection of posterior chest tube site, culture results pending, the patient is been started on Keflex 500 mg by mouth every 8 hours and local wound care. In addition she has been given cough medication.  She will return  1 week  for followup visit and wound check, at that time we should have the culture results back.   Vedansh Kerstetter B 12/30/2013 3:12 PM

## 2013-12-31 ENCOUNTER — Other Ambulatory Visit: Payer: Self-pay | Admitting: *Deleted

## 2013-12-31 ENCOUNTER — Other Ambulatory Visit: Payer: Self-pay | Admitting: Family Medicine

## 2013-12-31 DIAGNOSIS — J45909 Unspecified asthma, uncomplicated: Secondary | ICD-10-CM

## 2013-12-31 LAB — WOUND CULTURE: Gram Stain: NONE SEEN

## 2013-12-31 MED ORDER — ALBUTEROL SULFATE HFA 108 (90 BASE) MCG/ACT IN AERS: 1.0000 | INHALATION_SPRAY | Freq: Four times a day (QID) | RESPIRATORY_TRACT | Status: AC | PRN

## 2013-12-31 NOTE — Telephone Encounter (Signed)
Med filled.  

## 2014-01-03 ENCOUNTER — Ambulatory Visit: Payer: Medicare Other | Admitting: Pharmacist Clinician (PhC)/ Clinical Pharmacy Specialist

## 2014-01-03 ENCOUNTER — Ambulatory Visit: Payer: Medicare Other

## 2014-01-03 ENCOUNTER — Encounter: Payer: Self-pay | Admitting: Physician Assistant

## 2014-01-03 ENCOUNTER — Ambulatory Visit (INDEPENDENT_AMBULATORY_CARE_PROVIDER_SITE_OTHER): Payer: Self-pay | Admitting: *Deleted

## 2014-01-03 ENCOUNTER — Encounter: Payer: Medicare Other | Admitting: Pharmacist Clinician (PhC)/ Clinical Pharmacy Specialist

## 2014-01-03 ENCOUNTER — Ambulatory Visit (INDEPENDENT_AMBULATORY_CARE_PROVIDER_SITE_OTHER): Payer: Medicare Other | Admitting: Physician Assistant

## 2014-01-03 VITALS — BP 150/84 | HR 59 | Ht 65.0 in | Wt 244.0 lb

## 2014-01-03 DIAGNOSIS — N183 Chronic kidney disease, stage 3 unspecified: Secondary | ICD-10-CM

## 2014-01-03 DIAGNOSIS — I701 Atherosclerosis of renal artery: Secondary | ICD-10-CM | POA: Diagnosis not present

## 2014-01-03 DIAGNOSIS — I10 Essential (primary) hypertension: Secondary | ICD-10-CM

## 2014-01-03 DIAGNOSIS — Z902 Acquired absence of lung [part of]: Secondary | ICD-10-CM

## 2014-01-03 DIAGNOSIS — E669 Obesity, unspecified: Secondary | ICD-10-CM

## 2014-01-03 DIAGNOSIS — D381 Neoplasm of uncertain behavior of trachea, bronchus and lung: Secondary | ICD-10-CM

## 2014-01-03 DIAGNOSIS — I491 Atrial premature depolarization: Secondary | ICD-10-CM

## 2014-01-03 DIAGNOSIS — T8149XA Infection following a procedure, other surgical site, initial encounter: Secondary | ICD-10-CM

## 2014-01-03 DIAGNOSIS — N184 Chronic kidney disease, stage 4 (severe): Secondary | ICD-10-CM | POA: Insufficient documentation

## 2014-01-03 NOTE — Assessment & Plan Note (Signed)
Make referral for dietary consult.

## 2014-01-03 NOTE — Assessment & Plan Note (Signed)
Repeat Dopplers in 6 months

## 2014-01-03 NOTE — Assessment & Plan Note (Signed)
Continues to have multiple PACs on her EKG with an irregular regular rhythm.  Bystolic is 20 mg daily.  No apparent dizziness.

## 2014-01-03 NOTE — Progress Notes (Signed)
Susan Pittman returns today for a wound check of her superficial wound infection of one of the previous chest tube sites.  It is decreasing in depth without any drainage.  I cleaned the site with NS and packed the area with a dampened piece 2x2 and covered with a dry dressing.  Her husband again observed the procedure and will continue dressing changes at home.  She will return on Thursday as scheduled.

## 2014-01-03 NOTE — Progress Notes (Signed)
Date:  01/03/2014   ID:  Susan Pittman, DOB December 01, 1940, MRN [id number]  PCP:  Susan Asa, MD  Primary Cardiologist:  Susan Pittman     History of Present Illness: Susan Pittman is a 73 y.o. female with history of hypertension for at least 30-40 years. She initially was treated with diuretic therapy. She also states that approximately 20 years ago she had undergone a cardiac catheterization and was not told of having significant coronary artery blockage. Additional problems have included significant arthritis and she has undergone knee arthroscopic surgery. She has a history of hypothyroidism, remote peptic ulcer disease in the 1980s, as well as GERD. She also does note palpitations typically in the evening. She was recently seen by Susan Pittman and at that time her blood pressure had been fluctuating ranging from 115 to 726 systolically and 70 to 20B diastolically despite being on her medical regimen which consisted of amlodipine 5 mg, Bystolic 20 mg, and Diovan HCT 320/12.5 mg.  Additional problems also include hypothyroidism, osteoporosis, mild hyperlipidemia for which she's been followed medically.  She saw Susan Pittman earlier this month for evaluation. He ordered a renal Doppler study that suggested a hemodynamically significant right renal artery stenosis.  She subsequently underwent stenting of the right renal artery by Dr. Gwenlyn Pittman.  Recent followup Dopplers were negative for restenosis. These will be followed in 6 months.  She presented to [facility] on 12/15/2013. She underwent Bronchoscopy, Left VATS with wedge resection of the left lower lobe. Surgical pathology reported this to be a cancerous nodule. Therefore, a MINI thoracotomy was performed and the remaining portion of her left lower lobe was removed. She also underwent lymph node sampling.   Prior to this procedures patient underwent electric catheterization which revealed only a mild 20% RCA stenosis along with normal LV  function. The other coronary arteries were normal.  2-D echocardiogram on 11/01/2013 revealed normal LV function and grade 2 diastolic dysfunction.  Patient presents today for a post hospital evaluation. Reports some shortness of breath once in a while particularly after she feels "paniced".  She is scheduled to see Dr. Servando Pittman this Thursday with repeat chest x-ray. Reports that the wheezing and coughing has improved considerably.    The patient currently denies nausea, vomiting, fever, chest pain, orthopnea, dizziness, PND, congestion, abdominal pain, hematochezia, melena, lower extremity edema, claudication.  Wt Readings from Last 3 Encounters:  01/03/14 244 lb (110.678 kg)  12/30/13 244 lb (110.678 kg)  12/28/13 244 lb (110.678 kg)     Past Medical History  Diagnosis Date  . Asthma   . Hypertension   . GERD (gastroesophageal reflux disease)   . Allergy   . Hypothyroid   . Osteoporosis   . Gastric ulcer   . Hiatal hernia   . Hyperlipidemia     "don't take RX for it" (11/08/2013)  . Renal artery stenosis   . Lung mass     superior segment LLL/notes 11/08/2013  . Pneumonia     "I've had it ?3 times" (11/08/2013)  . Arthritis     "knees, hips, back, hands" (11/08/2013)  . DJD (degenerative joint disease)   . Basal cell carcinoma     "cut them off face & right shoulder" (11/08/2013)  . Lung nodule/ adenoca > LLobectomy 12/15/13  11/08/2013    PET 11/23/2013 1. Dominant sub solid left lower lobe nodule does not demonstrate significantly increased metabolic activity. However, morphologically, adenocarcinoma is still a concern.   -  Spirometry 11/24/13 wnl  - 11/24/2013 T surg eval rec> LLower lobectomy 12/15/2013 for adenoca     Current Outpatient Prescriptions  Medication Sig Dispense Refill  . albuterol (PROVENTIL HFA;VENTOLIN HFA) 108 (90 BASE) MCG/ACT inhaler Inhale 1-2 puffs into the lungs every 6 (six) hours as needed for wheezing or shortness of breath.  18 g  1  . amLODipine (NORVASC) 10  MG tablet Take 10 mg by mouth daily.      . Calcium 600-200 MG-UNIT per tablet Take 1 tablet by mouth 2 (two) times daily. Takes at [employer]      . cephALEXin (KEFLEX) 500 MG capsule Take 1 capsule (500 mg total) by mouth 3 (three) times daily.  21 capsule  0  . cetirizine (ZYRTEC) 10 MG tablet Take 10 mg by mouth at bedtime.       . chlorpheniramine-HYDROcodone (TUSSIONEX) 10-8 MG/5ML LQCR Take 5 mLs by mouth every 12 (twelve) hours as needed for cough.  60 mL  0  . clopidogrel (PLAVIX) 75 MG tablet Take 1 tablet (75 mg total) by mouth daily with breakfast.      . etodolac (LODINE) 400 MG tablet TAKE ONE TABLET BY MOUTH TWICE DAILY  120 tablet  0  . fluticasone (FLONASE) 50 MCG/ACT nasal spray Place 2 sprays into both nostrils daily as needed for allergies or rhinitis.      Marland Kitchen lansoprazole (PREVACID) 15 MG capsule Take 15 mg by mouth daily.        Marland Kitchen levothyroxine (SYNTHROID, LEVOTHROID) 88 MCG tablet Take 88 mcg by mouth daily before breakfast.      . Multiple Vitamins-Minerals (MULTI FOR HER 50+ PO) Take 1 tablet by mouth daily.       . Nebivolol HCl (BYSTOLIC) 20 MG TABS Take 20 mg by mouth every morning.      . traMADol (ULTRAM) 50 MG tablet Take 1 tablet (50 mg total) by mouth every 6 (six) hours as needed for moderate pain.  40 tablet  0  . valsartan-hydrochlorothiazide (DIOVAN-HCT) 320-25 MG per tablet Take 1 tablet by mouth daily.       No current facility-administered medications for this visit.    Allergies:    Allergies  Allergen Reactions  . Benazepril Hcl Anaphylaxis and Cough  . Loratadine Anaphylaxis    anaphylaxis  . Celecoxib     REACTION: aching  . Lisinopril     REACTION: cough  . Allegra [Fexofenadine Hcl]     Severe back pain  . Sulfa Antibiotics Hives    Social History:  The patient  reports that she has never smoked. She has never used smokeless tobacco. She reports that she does not drink alcohol or use illicit drugs.   Family history:   Family  History  Problem Relation Age of Onset  . Breast cancer Sister   . Emphysema Father     smoked  . Allergies Sister   . Allergies Brother   . Heart disease Mother   . Heart disease Father     ROS:  Please see the history of present illness.  All other systems reviewed and negative.   PHYSICAL EXAM: VS:  BP 150/84  Pulse 59  Ht 5\' 5"  (1.651 m)  Wt 244 lb (110.678 kg)  BMI 40.60 kg/m2 Morbidly obese, well developed, in no acute distress HEENT: Pupils are equal round react to light accommodation extraocular movements are intact.  Neck: no JVDNo cervical lymphadenopathy. Cardiac: Regularly irregular without murmurs rubs or gallops. Lungs:  Decreased breath sounds on the left with rales noted.   Right side is clear Abd: soft, nontender, positive bowel sounds all quadrants, no hepatosplenomegaly Ext: no lower extremity edema.  2+ radial and dorsalis pedis pulses. Skin: warm and dry Neuro:  Grossly normal  EKG:  Sinus bradycardia at a cardio with PACs  rate 59 beats per minute  ASSESSMENT AND PLAN:  Problem List Items Addressed This Visit   HYPERTENSION (Chronic)     BNP is mildly elevated at this time. Currently treated with Diovan HCT 622/29 mg daily, Bystolic 20 mg, amlodipine 10 mg.  No changes at this time.    Obesity     Make referral for dietary consult.      Premature atrial contractions     Continues to have multiple PACs on her EKG with an irregular regular rhythm.  Bystolic is 20 mg daily.  No apparent dizziness.    Renal artery atherosclerosis, s/p LRA stent 11/08/13     Repeat Dopplers in 6 months    CKD (chronic kidney disease) stage 3, GFR 30-59 ml/min    Other Visit Diagnoses   Premature atrial complexes    -  Primary    Relevant Orders       EKG 12-Lead

## 2014-01-03 NOTE — Patient Instructions (Signed)
1.  Follow up as scheduled with Dr. Claiborne Billings. 2.  No changes to meds at this time. 3.  I will set you up for dietary referral.

## 2014-01-03 NOTE — Assessment & Plan Note (Addendum)
BNP is mildly elevated at this time. Currently treated with Diovan HCT 438/38 mg daily, Bystolic 20 mg, amlodipine 10 mg.  No changes at this time.

## 2014-01-05 ENCOUNTER — Telehealth: Payer: Self-pay | Admitting: Cardiovascular Disease

## 2014-01-05 NOTE — Telephone Encounter (Signed)
Was placed on  Plavix by Dr. Gwenlyn Found and states that she is having nose bleeds and is not sure if the Plavix is causing this .Marland Kitchen Please Call

## 2014-01-05 NOTE — Telephone Encounter (Signed)
Patient states she has been having a nose bleed for last few days. She states that the bleeding beocming heavier with clots, but she is able to stop the nose bleed.  RN asked patient If household is dry due to the heat- because of winter. She states that she keeps a pot of water on the stove.   RN informed to continue to do that, Plavix may contribute to nosebleed bleeding to be heavier ,but not causing a nose bleed. Continue to monitor if continues contact PCP or ENT. Patient is also taking Aspirin  81 mg daily.  Will review with Dr Collie Siad RN

## 2014-01-06 ENCOUNTER — Encounter: Payer: Self-pay | Admitting: Cardiothoracic Surgery

## 2014-01-06 ENCOUNTER — Ambulatory Visit
Admission: RE | Admit: 2014-01-06 | Discharge: 2014-01-06 | Disposition: A | Discharge status: Home / Self Care | Payer: Medicare Other | Source: Ambulatory Visit | Attending: Cardiothoracic Surgery | Admitting: Cardiothoracic Surgery

## 2014-01-06 ENCOUNTER — Ambulatory Visit (INDEPENDENT_AMBULATORY_CARE_PROVIDER_SITE_OTHER): Payer: Self-pay | Admitting: Cardiothoracic Surgery

## 2014-01-06 VITALS — BP 145/75 | HR 65 | Resp 20 | Ht 65.0 in | Wt 244.0 lb

## 2014-01-06 DIAGNOSIS — Z9889 Other specified postprocedural states: Secondary | ICD-10-CM

## 2014-01-06 DIAGNOSIS — T8140XA Infection following a procedure, unspecified, initial encounter: Secondary | ICD-10-CM

## 2014-01-06 DIAGNOSIS — C3490 Malignant neoplasm of unspecified part of unspecified bronchus or lung: Secondary | ICD-10-CM

## 2014-01-06 DIAGNOSIS — R918 Other nonspecific abnormal finding of lung field: Secondary | ICD-10-CM | POA: Diagnosis not present

## 2014-01-06 DIAGNOSIS — T8149XA Infection following a procedure, other surgical site, initial encounter: Secondary | ICD-10-CM

## 2014-01-06 DIAGNOSIS — C3430 Malignant neoplasm of lower lobe, unspecified bronchus or lung: Secondary | ICD-10-CM

## 2014-01-06 DIAGNOSIS — Z902 Acquired absence of lung [part of]: Secondary | ICD-10-CM

## 2014-01-06 NOTE — Progress Notes (Signed)
Susan Pittman       [place] [address]             [phone]                  Deysi L Peggs Liborio Negron Torres Medical Record #[id number] Date of Birth: [birth year]  Referring JJ:OACZ, Christena Deem, MD Primary Cardiology:Dr Gwenlyn Found Primary Care:Katherine [name], MD  Chief Complaint:   PostOp Follow Up Visit 12/15/2013  OPERATIVE REPORT  PREOPERATIVE DIAGNOSIS: Left lower lobe lung mass.  POSTOPERATIVE DIAGNOSIS: Left lower lobe lung mass. Adenocarcinoma by  frozen section.  PROCEDURE PERFORMED: Bronchoscopy, left video-assisted thoracoscopy,  with wedge resection for biopsy of left lower lobe lesion, completion  left lower lobectomy, lymph node dissection, placement of On-Q device.  SURGEON: Lanelle Bal, MD   Diagnosis: GMS stain demonstrates abundant microorganisms present within the hyalinized nodules with morphology consistent with Histoplasmosis capsulatum. 1. Lung, wedge biopsy/resection, Left lower lobe - INVASIVE ADENOCARCINOMA SEE COMMENT. - TUMOR INVOLVES SURGICAL MARGIN. - NO LYMPHOVASCULAR INVASION IDENTIFIED. - SEE TUMOR SYNOPTIC TEMPLATE BELOW. 2. Lymph node, biopsy, 8 node - ONE LYMPH NODE, NEGATIVE FOR TUMOR (0/1), SEE COMMENT. 3. Lymph node, biopsy, 11 L - ONE LYMPH NODE, NEGATIVE FOR TUMOR (0/1). 4. Lung, resection (segmental or lobe), Left lower - BENIGN LUNG, SEE COMMENT. - NEGATIVE FOR ATYPIA OR MALIGNANCY. - BRONCHOVASCULAR MARGIN, NEGATIVE FOR ATYPIA OR MALIGNANCY. 5. Lymph node, biopsy, 10 node - ONE LYMPH NODE, NEGATIVE FOR TUMOR (0/1). 6. Lymph node, biopsy, 12 node - ONE LYMPH NODE, NEGATIVE FOR TUMOR (0/1). Microscopic Comment 1. LUNG 1 of 3 FINAL for RENI, HAUSNER ([id number]) Microscopic Comment(continued) Specimen, including laterality: Left lower lobe (parts 1 and 4). Procedure: Wedge resection and lobectomy Specimen integrity (intact/disrupted): Intact Tumor site: Subpleural Maximum tumor size (cm): 1.7  cm Histologic type: Adenocarcinoma (acinar and lepidic subtypes) Grade: I/well differentiated Margins: Present at margins, see comment. Distance to closest margin (cm): Present at margin Visceral pleura invasion: Absent Tumor extension: Tumor is confined to subpleural parenchyma Treatment effect (if treated with neoadjuvant therapy): None Lymph -Vascular invasion: Absent Lymph nodes: Number examined - 4; Number N1 nodes positive - 0; Number N2 nodes positive - 0 TNM code: pT1a, pN0, pMX Ancillary studies: Can be performed upon request Non-neoplastic lung: See part 4. Comments: The final surgical margin is part 4. 2. Sections of lymph node demonstrate multiple foci of extensively hyalinized and calcified intranodal nodules. AFB, PAS and GMS stains are pending and will be reported in an addendum. 4. There is no tumor grossly identified. Representative sections of the lung demonstrate non-neoplastic findings to include minimal chronic interstitial inflammation with rare epithelioid granuloma, anthracotic pigment deposition, minimal peribronchiolar chronic inflammation, Langerhans cell histiocytic nodules, and an incidental 3 mm subpleural minute pulmonary meningothelial-like nodule (chemodectoma). The chemodectoma is an incidental, benign finding of no prognostic significance. There are no features of epithelial dysplasia or malignancy present. (CR:kh 12-16-13) Mali RUND DO  History of Present Illness:     Patient returns to the office today after left lower lobectomy for stage I adenocarcinoma the lung. Patient feels much better today, no fever or chills, cough has improved. Staph aureus grew on the cultures done 2 days ago from infection of left chest tube site, sensitivity to Keflex. Patient has completed course of Keflex. The chest tube site is almost completely healed.   She has noted several episodes of nosebleed, continues on Plavix she discuss this with Dr. Naida Sleight office  and was  instructed to see ENT.   History  Smoking status  . Never Smoker   Smokeless tobacco  . Never Used       Allergies  Allergen Reactions  . Benazepril Hcl Anaphylaxis and Cough  . Loratadine Anaphylaxis    anaphylaxis  . Celecoxib     REACTION: aching  . Lisinopril     REACTION: cough  . Allegra [Fexofenadine Hcl]     Severe back pain  . Sulfa Antibiotics Hives    Current Outpatient Prescriptions  Medication Sig Dispense Refill  . albuterol (PROVENTIL HFA;VENTOLIN HFA) 108 (90 BASE) MCG/ACT inhaler Inhale 1-2 puffs into the lungs every 6 (six) hours as needed for wheezing or shortness of breath.  18 g  1  . amLODipine (NORVASC) 10 MG tablet Take 10 mg by mouth daily.      . Calcium 600-200 MG-UNIT per tablet Take 1 tablet by mouth 2 (two) times daily. Takes at [employer]      . cetirizine (ZYRTEC) 10 MG tablet Take 10 mg by mouth at bedtime.       . chlorpheniramine-HYDROcodone (TUSSIONEX) 10-8 MG/5ML LQCR Take 5 mLs by mouth every 12 (twelve) hours as needed for cough.  60 mL  0  . clopidogrel (PLAVIX) 75 MG tablet Take 1 tablet (75 mg total) by mouth daily with breakfast.      . etodolac (LODINE) 400 MG tablet TAKE ONE TABLET BY MOUTH TWICE DAILY  120 tablet  0  . fluticasone (FLONASE) 50 MCG/ACT nasal spray Place 2 sprays into both nostrils daily as needed for allergies or rhinitis.      Marland Kitchen lansoprazole (PREVACID) 15 MG capsule Take 15 mg by mouth daily.        Marland Kitchen levothyroxine (SYNTHROID, LEVOTHROID) 88 MCG tablet Take 88 mcg by mouth daily before breakfast.      . Multiple Vitamins-Minerals (MULTI FOR HER 50+ PO) Take 1 tablet by mouth daily.       . Nebivolol HCl (BYSTOLIC) 20 MG TABS Take 20 mg by mouth every morning.      . traMADol (ULTRAM) 50 MG tablet Take 1 tablet (50 mg total) by mouth every 6 (six) hours as needed for moderate pain.  40 tablet  0  . valsartan-hydrochlorothiazide (DIOVAN-HCT) 320-25 MG per tablet Take 1 tablet by mouth daily.       No current  facility-administered medications for this visit.       Physical Exam: BP 145/75  Pulse 65  Resp 20  Ht 5\' 5"  (1.651 m)  Wt 244 lb (110.678 kg)  BMI 40.60 kg/m2  SpO2 96%  General appearance: alert and cooperative Neurologic: intact Heart: regular rate and rhythm, S1, S2 normal, no murmur, click, rub or gallop Lungs: diminished breath sounds LLL Abdomen: soft, non-tender; bowel sounds normal; no masses,  no organomegaly Extremities: extremities normal, atraumatic, no cyanosis or edema and Homans sign is negative, no sign of DVT Wound: The larger incision and anterior chest tube site are well-healed. Chest tube site still has a small amount of packing was markedly improved, the other incisions are totally healed without evidence of infection  Diagnostic Studies & Laboratory data:         Recent Radiology Findings: Dg Chest 2 View  01/06/2014   CLINICAL DATA:  Post left lower lobectomy for resection of lung carcinoma, followup  EXAM: CHEST  2 VIEW  COMPARISON:  Chest x-ray of 12/30/2013  FINDINGS: There is little change in pleural  and parenchymal opacity at the left lung base most consistent with left effusion and atelectasis. Pneumonia would be very difficult to exclude. The right lung is clear. Cardiomegaly is stable. A lower anterior cervical spine fusion plate is noted.  IMPRESSION: Persistent opacity in the posterior left lower lobe most likely postoperative left effusion and atelectasis. Cannot exclude pneumonia. Recommend continued followu.p   Electronically Signed   By: Ivar Drape M.D.   On: 01/06/2014 13:25      Recent Labs: Lab Results  Component Value Date   WBC 10.7* 12/17/2013   HGB 10.3* 12/17/2013   HCT 32.1* 12/17/2013   PLT 211 12/17/2013   GLUCOSE 103* 12/17/2013   CHOL 180 10/13/2013   TRIG 105 10/13/2013   HDL 52 10/13/2013   LDLDIRECT 126.3 09/10/2012   LDLCALC 107* 10/13/2013   ALT 17 12/17/2013   AST 22 12/17/2013   NA 135* 12/17/2013   K 4.0 12/17/2013   CL  99 12/17/2013   CREATININE 1.21* 12/17/2013   BUN 29* 12/17/2013   CO2 24 12/17/2013   TSH 3.158 10/13/2013   INR 1.06 12/13/2013      Assessment / Plan:   Superficial infection of posterior chest tube site, culture results pending, the patient completed Keflex 500 mg by mouth every 8 hours and local wound care.   She will return  3 week  for followup visit and wound check. Patient's case was discussed in [facility] conference today, we will make her an appointment to see Dr. Earlie Server to discuss treatment options and possible studies for stage I adenocarcinoma the lung. She will contact Dr. Naida Sleight office again concerning her Plavix if she continues to have nosebleeds, and will see ENT.  Mishael Krysiak B 01/06/2014 2:26 PM

## 2014-01-07 NOTE — Telephone Encounter (Signed)
Please advise 

## 2014-01-07 NOTE — Telephone Encounter (Signed)
Patient can stop her Plavix

## 2014-01-07 NOTE — Telephone Encounter (Signed)
Returned call and informed pt per instructions by MD.  Also advised to try applying a thin layer of Vaseline to nasal septum, if not wearing O2, which will keep nasal mucosa from drying out.  Pt verbalized understanding and agreed w/ plan.  Pt also understands after hours call is available if needed.

## 2014-01-07 NOTE — Telephone Encounter (Signed)
Can you call this patient, please

## 2014-01-10 DIAGNOSIS — N183 Chronic kidney disease, stage 3 unspecified: Secondary | ICD-10-CM | POA: Diagnosis not present

## 2014-01-12 LAB — FUNGUS CULTURE W SMEAR
Fungal Smear: NONE SEEN
Fungal Smear: NONE SEEN

## 2014-01-18 ENCOUNTER — Encounter: Payer: Self-pay | Admitting: Family Medicine

## 2014-01-18 ENCOUNTER — Ambulatory Visit (HOSPITAL_BASED_OUTPATIENT_CLINIC_OR_DEPARTMENT_OTHER)
Admission: RE | Admit: 2014-01-18 | Discharge: 2014-01-18 | Disposition: A | Discharge status: Home / Self Care | Payer: Medicare Other | Source: Ambulatory Visit | Attending: Family Medicine | Admitting: Family Medicine

## 2014-01-18 ENCOUNTER — Telehealth: Payer: Self-pay | Admitting: *Deleted

## 2014-01-18 ENCOUNTER — Ambulatory Visit (INDEPENDENT_AMBULATORY_CARE_PROVIDER_SITE_OTHER): Payer: Medicare Other | Admitting: Family Medicine

## 2014-01-18 VITALS — BP 144/88 | HR 64 | Temp 98.2°F | Resp 16 | Wt 240.2 lb

## 2014-01-18 DIAGNOSIS — J811 Chronic pulmonary edema: Secondary | ICD-10-CM | POA: Diagnosis not present

## 2014-01-18 DIAGNOSIS — I509 Heart failure, unspecified: Secondary | ICD-10-CM | POA: Diagnosis not present

## 2014-01-18 DIAGNOSIS — J90 Pleural effusion, not elsewhere classified: Secondary | ICD-10-CM | POA: Insufficient documentation

## 2014-01-18 DIAGNOSIS — I10 Essential (primary) hypertension: Secondary | ICD-10-CM | POA: Diagnosis not present

## 2014-01-18 DIAGNOSIS — R0602 Shortness of breath: Secondary | ICD-10-CM | POA: Diagnosis not present

## 2014-01-18 DIAGNOSIS — R609 Edema, unspecified: Secondary | ICD-10-CM

## 2014-01-18 DIAGNOSIS — I701 Atherosclerosis of renal artery: Secondary | ICD-10-CM | POA: Diagnosis not present

## 2014-01-18 DIAGNOSIS — J189 Pneumonia, unspecified organism: Secondary | ICD-10-CM | POA: Diagnosis not present

## 2014-01-18 LAB — CBC WITH DIFFERENTIAL/PLATELET
BASOS ABS: 0 10*3/uL (ref 0.0–0.1)
Basophils Relative: 0.3 % (ref 0.0–3.0)
EOS ABS: 0.4 10*3/uL (ref 0.0–0.7)
Eosinophils Relative: 3.9 % (ref 0.0–5.0)
HCT: 30.7 % — ABNORMAL LOW (ref 36.0–46.0)
Hemoglobin: 10.1 g/dL — ABNORMAL LOW (ref 12.0–15.0)
Lymphocytes Relative: 15.2 % (ref 12.0–46.0)
Lymphs Abs: 1.7 10*3/uL (ref 0.7–4.0)
MCHC: 33 g/dL (ref 30.0–36.0)
MCV: 83.1 fl (ref 78.0–100.0)
MONO ABS: 0.7 10*3/uL (ref 0.1–1.0)
MONOS PCT: 6.4 % (ref 3.0–12.0)
Neutro Abs: 8.3 10*3/uL — ABNORMAL HIGH (ref 1.4–7.7)
Neutrophils Relative %: 74.2 % (ref 43.0–77.0)
PLATELETS: 351 10*3/uL (ref 150.0–400.0)
RBC: 3.7 Mil/uL — ABNORMAL LOW (ref 3.87–5.11)
RDW: 16.3 % — ABNORMAL HIGH (ref 11.5–14.6)
WBC: 11.2 10*3/uL — ABNORMAL HIGH (ref 4.5–10.5)

## 2014-01-18 LAB — BASIC METABOLIC PANEL
BUN: 47 mg/dL — AB (ref 6–23)
CALCIUM: 8.5 mg/dL (ref 8.4–10.5)
CO2: 23 meq/L (ref 19–32)
Chloride: 102 mEq/L (ref 96–112)
Creatinine, Ser: 2 mg/dL — ABNORMAL HIGH (ref 0.4–1.2)
GFR: 25.51 mL/min — AB (ref >60.00)
Glucose, Bld: 109 mg/dL — ABNORMAL HIGH (ref 70–99)
Potassium: 4.3 mEq/L (ref 3.5–5.1)
Sodium: 137 mEq/L (ref 135–145)

## 2014-01-18 LAB — BRAIN NATRIURETIC PEPTIDE: PRO B NATRI PEPTIDE: 371 pg/mL — AB (ref 0.0–100.0)

## 2014-01-18 MED ORDER — FUROSEMIDE 20 MG PO TABS: 20.0000 mg | ORAL_TABLET | Freq: Every day | ORAL | Status: DC

## 2014-01-18 MED ORDER — AMOXICILLIN-POT CLAVULANATE 875-125 MG PO TABS: 1.0000 | ORAL_TABLET | Freq: Two times a day (BID) | ORAL | Status: DC

## 2014-01-18 NOTE — Assessment & Plan Note (Signed)
Deteriorated.  Increased fluid retention.  Get BNP and CXR to assess.  Start lasix.  Will monitor closely.

## 2014-01-18 NOTE — Patient Instructions (Signed)
We'll call you with your lab and chest xray results and determine the next steps Go to the [facility] on [address] and get your xray Start the lasix to decrease the amount of fluid and improve your blood pressure Please call if anything changes or worsens- if your shortness of breath is severe, please go to the ER Call with any questions or concerns- anything! Hang in there!

## 2014-01-18 NOTE — Assessment & Plan Note (Signed)
Mildly elevated today in office but this could be due to pt's increased WOB and possible fluid around the heart.  Readings here today in office are mildly elevated but not nearly what they were at home.  Will add Lasix for fluid overload but no other med changes at this time.  Will follow closely.

## 2014-01-18 NOTE — Telephone Encounter (Signed)
Susan Pittman called this morning with concerns of a two day history of elevated blood pressures ranging in the 170/90 range with swollen feet and hands and also a cough.  I felt she should notify her PCP of these concerns.  She agreed.  We will see her as scheduled.

## 2014-01-18 NOTE — Progress Notes (Signed)
   Subjective:    Patient ID: Susan Pittman, female    DOB: 01-Jan-1941, 73 y.o.   MRN: [id number]  HPI HTN- pt was noted to have RAS and had stenting procedure done 1/5 w/ improvement in BP.  Following w/ Dr Gennaro Africa.  Stopped Plavix 3/6 due to nose bleeds.  Pt feels BP increased after stopping Plavix.  BP yesterday was high in the 160-178/89-101.  Pt reports she did more activity than usual and yesterday was 'wiped out'.  Increased edema of bilateral LEs and hands.  Cough- husband reports 3-4 days of cough.  + hoarseness.  Very SOB.  Tm 99.4 last evening.  Xray on 3/5 read- can't r/o PNA.     Review of Systems For ROS see HPI     Objective:   Physical Exam  Vitals reviewed. Constitutional: She is oriented to person, place, and time. She appears well-developed and well-nourished. Distressed: obviously uncomfortable   HENT:  Head: Normocephalic and atraumatic.  Eyes: Conjunctivae and EOM are normal. Pupils are equal, round, and reactive to light.  Neck: Normal range of motion. Neck supple. No thyromegaly present.  Cardiovascular: Normal rate, regular rhythm, normal heart sounds and intact distal pulses.   No murmur heard. Pulmonary/Chest: Breath sounds normal. She is in respiratory distress (obvious SOB and increased WOB).  Crackles at lung bases bilaterally  Abdominal: Soft. She exhibits no distension. There is no tenderness.  Musculoskeletal: She exhibits edema (bilateral LE edema, bilateral hand edema).  Lymphadenopathy:    She has no cervical adenopathy.  Neurological: She is alert and oriented to person, place, and time.  Skin: Skin is warm and dry.  Well healing incision on L flank  Psychiatric: She has a normal mood and affect. Her behavior is normal.          Assessment & Plan:

## 2014-01-18 NOTE — Progress Notes (Signed)
Pre visit review using our clinic review tool, if applicable. No additional management support is needed unless otherwise documented below in the visit note. 

## 2014-01-18 NOTE — Assessment & Plan Note (Signed)
New.  Concern that this is multi-factorial- new onset CHF (pt's recent ECHO shows moderately dilated atria consistent w/ diastolic dysfunction), possible PNA based on previous CXR.  Get CXR and BNP to assess.  Start Lasix.  Add abx coverage based on xray results.  Will follow closely.

## 2014-01-19 ENCOUNTER — Telehealth: Payer: Self-pay | Admitting: Family Medicine

## 2014-01-19 ENCOUNTER — Telehealth: Payer: Self-pay | Admitting: *Deleted

## 2014-01-19 ENCOUNTER — Telehealth: Payer: Self-pay | Admitting: Cardiovascular Disease

## 2014-01-19 NOTE — Telephone Encounter (Signed)
Reviewed OV note from yesterday and CXR.  Dr. [name] started pt on Lasix and rx'd abx.  No sooner appt available w/ Dr. Claiborne Billings.  First Extender appt is on Monday.  Returned call and pt verified x 2.  Pt informed message received.  Stated Dr. [name] was supposed to send Dr. Claiborne Billings her CXR and some other things and wants her to see him sooner than April 30th.  Pt informed there are no sooner appts available w/ Dr. Claiborne Billings and next available Extender appt is on Monday.  Pt informed RN did review note and CXR result note.  Informed she was given Lasix for swelling and Abx as well.  Will schedule appt for Monday and Dr. Claiborne Billings will be notified in case he can see her sooner.  Returned call and pt verified x 2.  Message forwarded to Dr. Claiborne Billings.  This note printed and placed on cart for review.

## 2014-01-19 NOTE — Telephone Encounter (Signed)
Spoke w/ Dr. Claiborne Billings ~ 1:50 pm.  Stated he agreed w/ plan for pt to come in Monday and that she is on Lasix.    2:30pm - Call to pt and informed.  Also advised to go to ER for CP, increased SOB or coughing up frothy (bubbly) sputum.  Pt verbalized understanding and agreed w/ plan.

## 2014-01-19 NOTE — Telephone Encounter (Signed)
Called pt with appt for [facility] 02/17/14.  She verbalized understanding of appt time and place.

## 2014-01-19 NOTE — Telephone Encounter (Signed)
Saw her primary doctor yesterday,she wants her to see  Him before April 30th. He is going to send over her xray and some other information.

## 2014-01-19 NOTE — Telephone Encounter (Signed)
Relevant patient education assigned to patient using Emmi. ° °

## 2014-01-24 ENCOUNTER — Ambulatory Visit (INDEPENDENT_AMBULATORY_CARE_PROVIDER_SITE_OTHER): Payer: Medicare Other | Admitting: Cardiology

## 2014-01-24 ENCOUNTER — Encounter: Payer: Self-pay | Admitting: Cardiology

## 2014-01-24 VITALS — BP 160/90 | HR 66 | Ht 65.0 in | Wt 248.0 lb

## 2014-01-24 DIAGNOSIS — N183 Chronic kidney disease, stage 3 unspecified: Secondary | ICD-10-CM | POA: Diagnosis not present

## 2014-01-24 DIAGNOSIS — I2510 Atherosclerotic heart disease of native coronary artery without angina pectoris: Secondary | ICD-10-CM

## 2014-01-24 DIAGNOSIS — R5383 Other fatigue: Secondary | ICD-10-CM

## 2014-01-24 DIAGNOSIS — R0602 Shortness of breath: Secondary | ICD-10-CM

## 2014-01-24 DIAGNOSIS — C3430 Malignant neoplasm of lower lobe, unspecified bronchus or lung: Secondary | ICD-10-CM

## 2014-01-24 DIAGNOSIS — I10 Essential (primary) hypertension: Secondary | ICD-10-CM | POA: Diagnosis not present

## 2014-01-24 DIAGNOSIS — I5031 Acute diastolic (congestive) heart failure: Secondary | ICD-10-CM | POA: Insufficient documentation

## 2014-01-24 DIAGNOSIS — R5381 Other malaise: Secondary | ICD-10-CM

## 2014-01-24 HISTORY — DX: Atherosclerotic heart disease of native coronary artery without angina pectoris: I25.10

## 2014-01-24 LAB — BASIC METABOLIC PANEL
BUN: 49 mg/dL — AB (ref 6–23)
CALCIUM: 8.7 mg/dL (ref 8.4–10.5)
CO2: 26 mEq/L (ref 19–32)
CREATININE: 2.41 mg/dL — AB (ref 0.50–1.10)
Chloride: 102 mEq/L (ref 96–112)
GLUCOSE: 93 mg/dL (ref 70–99)
POTASSIUM: 4.6 meq/L (ref 3.5–5.3)
Sodium: 139 mEq/L (ref 135–145)

## 2014-01-24 MED ORDER — VALSARTAN 320 MG PO TABS: 160.0000 mg | ORAL_TABLET | Freq: Every day | ORAL | Status: DC

## 2014-01-24 NOTE — Assessment & Plan Note (Signed)
BP uncontrolled, will increase lasix, she has diastolic dysfunction on her echo, type II.  Once volume controlled her BP should improve.

## 2014-01-24 NOTE — Progress Notes (Signed)
01/24/2014   PCP: [name], MD   Chief Complaint  Patient presents with  . Shortness of Breath    sob and swelling in feet x 1 wek    Primary Cardiologist:Dr. Claiborne Billings  HPI: 73 yo WF with recent cardiac cath 12/12/13 with non obstructive disease and 2 D echo done in 12/14 with normal EF and grade II diastolic dysfunction.  Also hx or RAS 80% rt with successful right renal artery PTA and stent using a balloon expandable stent 11/08/13, patent on cath in Feb. 2015.  She continues with HTN and diastolic HF.  In Feb.2015 she underwent VATS procedure for lung nodules with LL lobectomy by Dr. Servando Snare and diagnosed with INVASIVE ADENOCARCINOMA. She had a superficial wound infection post op and treated with Keflex.   More recently has developed SOB and repeat CXR 01/18/14 with:  No significant change in left basilar opacity compared to chest radiograph 01/06/2014. This is worrisome for pneumonia or Aspiration.  Suspect mild congestive heart failure pattern. There is new pulmonary vascular congestion and mild diffuse interstitial prominence with small bilateral pleural effusions. Lasix 20 mg daily was added and an antibiotic.  She has less SOB now but edema continues.  No chest pain except for surgical pain.       Allergies  Allergen Reactions  . Benazepril Hcl Anaphylaxis and Cough  . Loratadine Anaphylaxis    anaphylaxis  . Celecoxib     REACTION: aching  . Lisinopril     REACTION: cough  . Allegra [Fexofenadine Hcl]     Severe back pain  . Sulfa Antibiotics Hives    Current Outpatient Prescriptions  Medication Sig Dispense Refill  . albuterol (PROVENTIL HFA;VENTOLIN HFA) 108 (90 BASE) MCG/ACT inhaler Inhale 1-2 puffs into the lungs every 6 (six) hours as needed for wheezing or shortness of breath.  18 g  1  . amLODipine (NORVASC) 10 MG tablet Take 10 mg by mouth daily.      Marland Kitchen amoxicillin-clavulanate (AUGMENTIN) 875-125 MG per tablet Take 1 tablet by mouth 2 (two)  times daily.  20 tablet  0  . Calcium 600-200 MG-UNIT per tablet Take 1 tablet by mouth 2 (two) times daily. Takes at [employer]      . cetirizine (ZYRTEC) 10 MG tablet Take 10 mg by mouth at bedtime.       Marland Kitchen etodolac (LODINE) 400 MG tablet TAKE ONE TABLET BY MOUTH TWICE DAILY  120 tablet  0  . fluticasone (FLONASE) 50 MCG/ACT nasal spray Place 2 sprays into both nostrils daily as needed for allergies or rhinitis.      . furosemide (LASIX) 20 MG tablet Take 1 tablet (20 mg total) by mouth daily.  30 tablet  3  . lansoprazole (PREVACID) 15 MG capsule Take 15 mg by mouth daily.        Marland Kitchen levothyroxine (SYNTHROID, LEVOTHROID) 88 MCG tablet Take 88 mcg by mouth daily before breakfast.      . Multiple Vitamins-Minerals (MULTI FOR HER 50+ PO) Take 1 tablet by mouth daily.       . Nebivolol HCl (BYSTOLIC) 20 MG TABS Take 20 mg by mouth every morning.      . valsartan (DIOVAN) 320 MG tablet Take 0.5 tablets (160 mg total) by mouth daily.  30 tablet  2   No current facility-administered medications for this visit.    Past Medical History  Diagnosis Date  . Asthma   .  Hypertension   . GERD (gastroesophageal reflux disease)   . Allergy   . Hypothyroid   . Osteoporosis   . Gastric ulcer   . Hiatal hernia   . Hyperlipidemia     "don't take RX for it" (11/08/2013)  . Renal artery stenosis   . Lung mass     superior segment LLL/notes 11/08/2013  . Pneumonia     "I've had it ?3 times" (11/08/2013)  . Arthritis     "knees, hips, back, hands" (11/08/2013)  . DJD (degenerative joint disease)   . Basal cell carcinoma     "cut them off face & right shoulder" (11/08/2013)  . Lung nodule/ adenoca > LLobectomy 12/15/13  11/08/2013    PET 11/23/2013 1. Dominant sub solid left lower lobe nodule does not demonstrate significantly increased metabolic activity. However, morphologically, adenocarcinoma is still a concern.   - Spirometry 11/24/13 wnl  - 11/24/2013 T surg eval rec> LLower lobectomy 12/15/2013 for adenoca    . CAD (coronary artery disease), non obstructive by cardiac cath 12/12/13 01/24/2014    Past Surgical History  Procedure Laterality Date  . Knee arthroscopy Bilateral   . Anterior cervical decomp/discectomy fusion  2000    C5-C6  . Renal artery stent Right 11/08/2013    Archie Endo 11/08/2013  . Tonsillectomy and adenoidectomy  ?1946  . Cholecystectomy  1970's  . Vaginal hysterectomy  1970  . Dilation and curettage of uterus  1960's  . Skin cancer excision    . Coronary angiogram      Hx: of 2/ 2015  . Cardiac catheterization      Hx: of 2/ 2015  . Video bronchoscopy N/A 12/15/2013    Procedure: VIDEO BRONCHOSCOPY;  Surgeon: Grace Isaac, MD;  Location: [facility];  Service: Thoracic;  Laterality: N/A;  . Video assisted thoracoscopy (vats)/wedge resection Left 12/15/2013    Procedure: VIDEO ASSISTED THORACOSCOPY (VATS)/WEDGE RESECTION;  Surgeon: Grace Isaac, MD;  Location: [place];  Service: Thoracic;  Laterality: Left;  . Lobectomy Left 12/15/2013    Procedure: LOBECTOMY;  Surgeon: Grace Isaac, MD;  Location: [place];  Service: Thoracic;  Laterality: Left;    AOZ:HYQMVHQ:IO colds or fevers, + weight increase by 4 pounds since first of the month. Skin:no rashes or ulcers HEENT:no blurred vision, no congestion CV:see HPI PUL:see HPI GI:no diarrhea constipation or melena, no indigestion GU:no hematuria, no dysuria MS:no joint pain, no claudication, increased swelling Neuro:no syncope, no lightheadedness Endo:no diabetes, no thyroid disease  PHYSICAL EXAM BP 160/90  Pulse 66  Ht 5\' 5"  (1.651 m)  Wt 248 lb (112.492 kg)  BMI 41.27 kg/m2 General:Pleasant affect, NAD Skin:Warm and dry, brisk capillary refill HEENT:normocephalic, sclera clear, mucus membranes moist Neck:supple, no JVD, no bruits  Heart:S1S2 RRR without murmur, gallup, rub or click Lungs:clear without rales, rhonchi, or wheezes, diminished in Lt base. NGE:XBMWU, soft, non tender, + BS, do not palpate liver spleen  or masses Ext:2+lower ext edema bil, greatest in her feet but does extend up to her knees., 2+ pedal pulses, 2+ radial pulses Neuro:alert and oriented, MAE, follows commands, + facial symmetry  EKG: SR rate of 66 PACs have resolved, otherwise no changes from previous.  ASSESSMENT AND PLAN CKD (chronic kidney disease) stage 3, GFR 30-59 ml/min Cr. Elevated on last lab, will recheck today. I discussed with Dr. Claiborne Billings- will hold HCTZ for now to protect her kidneys    HYPERTENSION BP uncontrolled, will increase lasix, she has diastolic dysfunction on her echo, type  II.  Once volume controlled her BP should improve.  SOB (shortness of breath) Improved SOB though present with any exertion.  Again have increased the lasix.  Acute diastolic HF (heart failure) Increase lasix to 60 mg daily for 2 days then 40 mg daily.  Lung cancer, left lower lobe Followed by Dr. Servando Snare and oncology   CAD (coronary artery disease), non obstructive by cardiac cath 12/12/13 No chest pain except area she has since her lung surgery.    I am increasing lasix, if no results would add metolazone following renal closely.  Renal arteries were patent on recent cath.  Follow up with me in 1 week.  She will need to see Dr. Claiborne Billings end of April, but will monitor closely until then.   She was bradycardic in the hospital but none recently.

## 2014-01-24 NOTE — Assessment & Plan Note (Addendum)
Cr. Elevated on last lab, will recheck today. I discussed with Dr. Claiborne Billings- will hold HCTZ for now to protect her kidneys

## 2014-01-24 NOTE — Assessment & Plan Note (Signed)
Improved SOB though present with any exertion.  Again have increased the lasix.

## 2014-01-24 NOTE — Assessment & Plan Note (Signed)
Followed by Dr. Servando Snare and oncology

## 2014-01-24 NOTE — Assessment & Plan Note (Signed)
No chest pain except area she has since her lung surgery.

## 2014-01-24 NOTE — Patient Instructions (Signed)
Take total of 60 mg Lasix today and tomorrow.  Then decrease to 40 mg daily.  Have labs done today.  Follow up with me or extender in 2 weeks.  Keep appt with Dr. Claiborne Billings at the end of April.     I hope with decrease of edema your BP will come down.  We will try that first.  I am stopping the HCTZ for now.  I sent in script for diovan alone.    Weigh daily Call [phone] if weight climbs more than 3 pounds in a day or 5 pounds in a week. No salt to very little salt in your diet.  No more than 2000 mg in a day. Call if increased shortness of breath or increased swelling.

## 2014-01-24 NOTE — Assessment & Plan Note (Signed)
Increase lasix to 60 mg daily for 2 days then 40 mg daily.

## 2014-01-25 ENCOUNTER — Telehealth: Payer: Self-pay | Admitting: Cardiology

## 2014-01-25 DIAGNOSIS — R0602 Shortness of breath: Secondary | ICD-10-CM

## 2014-01-25 DIAGNOSIS — I5031 Acute diastolic (congestive) heart failure: Secondary | ICD-10-CM | POA: Diagnosis not present

## 2014-01-25 DIAGNOSIS — N183 Chronic kidney disease, stage 3 unspecified: Secondary | ICD-10-CM | POA: Diagnosis not present

## 2014-01-25 LAB — PRO B NATRIURETIC PEPTIDE: Pro B Natriuretic peptide (BNP): 5088 pg/mL — ABNORMAL HIGH (ref <126)

## 2014-01-25 MED ORDER — VALSARTAN 160 MG PO TABS: 160.0000 mg | ORAL_TABLET | Freq: Every day | ORAL | Status: DC

## 2014-01-25 NOTE — Telephone Encounter (Signed)
Spoke rep. Prior authorization  Started and completed   dx heart failure . Medication change valsartan 160 mg  One tablet a day  Per Mickel Baas NP  REP. STATES MEDICATION WAS APPROVED UNTIL Oct 18 2014

## 2014-01-25 NOTE — Telephone Encounter (Signed)
Notified patient. Medication had been approved. Prescription is for Valsartan 160 mg one tablet a day  Mrs. Paver verbalized understanding.

## 2014-01-25 NOTE — Telephone Encounter (Signed)
CALLED AND SPOKE PHARM TEC. MILDRED  RN informed Everlene Farrier - Valsartan 160 mg was approved by [employer] . She states the medication went through.

## 2014-01-25 NOTE — Telephone Encounter (Signed)
The insurance company  will not pay for her Valsartan unless she gets prior approval. Please call [phone] to get approval,after that please call [facility].

## 2014-01-25 NOTE — Telephone Encounter (Signed)
Will discuss with Mickel Baas and call patient

## 2014-01-25 NOTE — Telephone Encounter (Signed)
Spoke to patient. Susan Pittman has question  concerning if valsartan was called into pharmacy.  Informed patient that medication was sent to pharmacy on 01/24/14. reviewed with Mickel Baas NP---information given to patient ,results of labs given as well. RN informed patient that labs are needed  for thurs 02/03/14. RN informed patient she can have lab drawn at [address] ([facility]) She verbalized understanding.

## 2014-01-25 NOTE — Telephone Encounter (Signed)
Pt saw Mickel Baas yesterday and had blood work also. She waiting to get the results so she would know how to take her medicine.

## 2014-01-27 ENCOUNTER — Ambulatory Visit (INDEPENDENT_AMBULATORY_CARE_PROVIDER_SITE_OTHER): Payer: Self-pay | Admitting: Cardiothoracic Surgery

## 2014-01-27 ENCOUNTER — Other Ambulatory Visit: Payer: Self-pay

## 2014-01-27 ENCOUNTER — Ambulatory Visit
Admission: RE | Admit: 2014-01-27 | Discharge: 2014-01-27 | Disposition: A | Discharge status: Home / Self Care | Payer: Medicare Other | Source: Ambulatory Visit | Attending: Cardiothoracic Surgery | Admitting: Cardiothoracic Surgery

## 2014-01-27 ENCOUNTER — Encounter: Payer: Self-pay | Admitting: Cardiothoracic Surgery

## 2014-01-27 VITALS — BP 148/76 | HR 67 | Resp 20 | Ht 65.0 in | Wt 248.0 lb

## 2014-01-27 DIAGNOSIS — C3430 Malignant neoplasm of lower lobe, unspecified bronchus or lung: Secondary | ICD-10-CM

## 2014-01-27 DIAGNOSIS — T8140XA Infection following a procedure, unspecified, initial encounter: Secondary | ICD-10-CM

## 2014-01-27 DIAGNOSIS — Z902 Acquired absence of lung [part of]: Secondary | ICD-10-CM

## 2014-01-27 DIAGNOSIS — I2510 Atherosclerotic heart disease of native coronary artery without angina pectoris: Secondary | ICD-10-CM

## 2014-01-27 DIAGNOSIS — J90 Pleural effusion, not elsewhere classified: Secondary | ICD-10-CM

## 2014-01-27 DIAGNOSIS — Z9889 Other specified postprocedural states: Secondary | ICD-10-CM

## 2014-01-27 DIAGNOSIS — T8149XA Infection following a procedure, other surgical site, initial encounter: Secondary | ICD-10-CM

## 2014-01-27 LAB — AFB CULTURE WITH SMEAR (NOT AT ARMC): Acid Fast Smear: NONE SEEN

## 2014-01-27 LAB — BASIC METABOLIC PANEL
BUN: 49 mg/dL — ABNORMAL HIGH (ref 6–23)
CO2: 26 mEq/L (ref 19–32)
CREATININE: 2.46 mg/dL — AB (ref 0.50–1.10)
Calcium: 8.3 mg/dL — ABNORMAL LOW (ref 8.4–10.5)
Chloride: 105 mEq/L (ref 96–112)
Glucose, Bld: 131 mg/dL — ABNORMAL HIGH (ref 70–99)
Potassium: 5.1 mEq/L (ref 3.5–5.3)
SODIUM: 141 meq/L (ref 135–145)

## 2014-01-27 NOTE — Progress Notes (Deleted)
[address]       [place] [address]             [phone]                   Almadelia L Boatman [place] Medical Record #[id number] Date of Birth: September 22, 1941  Referring NW:GNFA, Christena Deem, MD Primary Cardiology:Dr Gwenlyn Found Primary Care:Katherine [name], MD  Chief Complaint:   PostOp Follow Up Visit 12/15/2013  OPERATIVE REPORT  PREOPERATIVE DIAGNOSIS: Left lower lobe lung mass.  POSTOPERATIVE DIAGNOSIS: Left lower lobe lung mass. Adenocarcinoma by  frozen section.  PROCEDURE PERFORMED: Bronchoscopy, left video-assisted thoracoscopy,  with wedge resection for biopsy of left lower lobe lesion, completion  left lower lobectomy, lymph node dissection, placement of On-Q device.  SURGEON: Lanelle Bal, MD   Diagnosis: GMS stain demonstrates abundant microorganisms present within the hyalinized nodules with morphology consistent with Histoplasmosis capsulatum. 1. Lung, wedge biopsy/resection, Left lower lobe - INVASIVE ADENOCARCINOMA SEE COMMENT. - TUMOR INVOLVES SURGICAL MARGIN. - NO LYMPHOVASCULAR INVASION IDENTIFIED. - SEE TUMOR SYNOPTIC TEMPLATE BELOW. 2. Lymph node, biopsy, 8 node - ONE LYMPH NODE, NEGATIVE FOR TUMOR (0/1), SEE COMMENT. 3. Lymph node, biopsy, 11 L - ONE LYMPH NODE, NEGATIVE FOR TUMOR (0/1). 4. Lung, resection (segmental or lobe), Left lower - BENIGN LUNG, SEE COMMENT. - NEGATIVE FOR ATYPIA OR MALIGNANCY. - BRONCHOVASCULAR MARGIN, NEGATIVE FOR ATYPIA OR MALIGNANCY. 5. Lymph node, biopsy, 10 node - ONE LYMPH NODE, NEGATIVE FOR TUMOR (0/1). 6. Lymph node, biopsy, 12 node - ONE LYMPH NODE, NEGATIVE FOR TUMOR (0/1). Microscopic Comment 1. LUNG 1 of 3 FINAL for NETASHA, WEHRLI ([id number]) Microscopic Comment(continued) Specimen, including laterality: Left lower lobe (parts 1 and 4). Procedure: Wedge resection and lobectomy Specimen integrity (intact/disrupted): Intact Tumor site: Subpleural Maximum tumor size (cm): 1.7  cm Histologic type: Adenocarcinoma (acinar and lepidic subtypes) Grade: I/well differentiated Margins: Present at margins, see comment. Distance to closest margin (cm): Present at margin Visceral pleura invasion: Absent Tumor extension: Tumor is confined to subpleural parenchyma Treatment effect (if treated with neoadjuvant therapy): None Lymph -Vascular invasion: Absent Lymph nodes: Number examined - 4; Number N1 nodes positive - 0; Number N2 nodes positive - 0 TNM code: pT1a, pN0, pMX Ancillary studies: Can be performed upon request Non-neoplastic lung: See part 4. Comments: The final surgical margin is part 4. 2. Sections of lymph node demonstrate multiple foci of extensively hyalinized and calcified intranodal nodules. AFB, PAS and GMS stains are pending and will be reported in an addendum. 4. There is no tumor grossly identified. Representative sections of the lung demonstrate non-neoplastic findings to include minimal chronic interstitial inflammation with rare epithelioid granuloma, anthracotic pigment deposition, minimal peribronchiolar chronic inflammation, Langerhans cell histiocytic nodules, and an incidental 3 mm subpleural minute pulmonary meningothelial-like nodule (chemodectoma). The chemodectoma is an incidental, benign finding of no prognostic significance. There are no features of epithelial dysplasia or malignancy present. (CR:kh 12-16-13) Mali RUND DO  History of Present Illness:     Patient returns to the office today after left lower lobectomy for stage I adenocarcinoma the lung. Patient feels much better today, no fever or chills, cough has improved. Staph aureus grew on the cultures done 2 days ago from infection of left chest tube site, sensitivity to Keflex. Patient has completed course of Keflex. The chest tube site is almost completely healed.   She has noted several episodes of nosebleed, continues on Plavix she discuss this with Dr.  Barry's office and was  instructed to see ENT.   History  Smoking status  . Never Smoker   Smokeless tobacco  . Never Used       Allergies  Allergen Reactions  . Benazepril Hcl Anaphylaxis and Cough  . Loratadine Anaphylaxis    anaphylaxis  . Celecoxib     REACTION: aching  . Lisinopril     REACTION: cough  . Allegra [Fexofenadine Hcl]     Severe back pain  . Sulfa Antibiotics Hives    Current Outpatient Prescriptions  Medication Sig Dispense Refill  . albuterol (PROVENTIL HFA;VENTOLIN HFA) 108 (90 BASE) MCG/ACT inhaler Inhale 1-2 puffs into the lungs every 6 (six) hours as needed for wheezing or shortness of breath.  18 g  1  . amLODipine (NORVASC) 10 MG tablet Take 10 mg by mouth daily.      Marland Kitchen amoxicillin-clavulanate (AUGMENTIN) 875-125 MG per tablet Take 1 tablet by mouth 2 (two) times daily.  20 tablet  0  . Calcium 600-200 MG-UNIT per tablet Take 1 tablet by mouth 2 (two) times daily. Takes at [employer]      . cetirizine (ZYRTEC) 10 MG tablet Take 10 mg by mouth at bedtime.       Marland Kitchen etodolac (LODINE) 400 MG tablet TAKE ONE TABLET BY MOUTH TWICE DAILY  120 tablet  0  . fluticasone (FLONASE) 50 MCG/ACT nasal spray Place 2 sprays into both nostrils daily as needed for allergies or rhinitis.      . furosemide (LASIX) 20 MG tablet Take 1 tablet (20 mg total) by mouth daily.  30 tablet  3  . lansoprazole (PREVACID) 15 MG capsule Take 15 mg by mouth daily.        Marland Kitchen levothyroxine (SYNTHROID, LEVOTHROID) 88 MCG tablet Take 88 mcg by mouth daily before breakfast.      . Multiple Vitamins-Minerals (MULTI FOR HER 50+ PO) Take 1 tablet by mouth daily.       . Nebivolol HCl (BYSTOLIC) 20 MG TABS Take 20 mg by mouth every morning.      . valsartan (DIOVAN) 160 MG tablet Take 1 tablet (160 mg total) by mouth daily.  30 tablet  6   No current facility-administered medications for this visit.       Physical Exam: There were no vitals taken for this visit.  General appearance: alert and  cooperative Neurologic: intact Heart: regular rate and rhythm, S1, S2 normal, no murmur, click, rub or gallop Lungs: diminished breath sounds LLL Abdomen: soft, non-tender; bowel sounds normal; no masses,  no organomegaly Extremities: extremities normal, atraumatic, no cyanosis or edema and Homans sign is negative, no sign of DVT Wound: The larger incision and anterior chest tube site are well-healed. Chest tube site still has a small amount of packing was markedly improved, the other incisions are totally healed without evidence of infection  Diagnostic Studies & Laboratory data:         Recent Radiology Findings: No results found.    Recent Labs: Lab Results  Component Value Date   WBC 11.2* 01/18/2014   HGB 10.1* 01/18/2014   HCT 30.7* 01/18/2014   PLT 351.0 01/18/2014   GLUCOSE 93 01/24/2014   CHOL 180 10/13/2013   TRIG 105 10/13/2013   HDL 52 10/13/2013   LDLDIRECT 126.3 09/10/2012   LDLCALC 107* 10/13/2013   ALT 17 12/17/2013   AST 22 12/17/2013   NA 139 01/24/2014   K 4.6 01/24/2014   CL 102 01/24/2014  CREATININE 2.41* 01/24/2014   BUN 49* 01/24/2014   CO2 26 01/24/2014   TSH 3.158 10/13/2013   INR 1.06 12/13/2013      Assessment / Plan:   Superficial infection of posterior chest tube site, culture results pending, the patient completed Keflex 500 mg by mouth every 8 hours and local wound care.   She will return  3 week  for followup visit and wound check. Patient's case was discussed in [facility] conference today, we will make her an appointment to see Dr. Earlie Server to discuss treatment options and possible studies for stage I adenocarcinoma the lung. She will contact Dr. Naida Sleight office again concerning her Plavix if she continues to have nosebleeds, and will see ENT.  Adriell Polansky B 01/27/2014 1:10 PM

## 2014-01-28 ENCOUNTER — Encounter: Payer: Self-pay | Admitting: *Deleted

## 2014-01-28 ENCOUNTER — Other Ambulatory Visit (HOSPITAL_COMMUNITY)
Admission: RE | Admit: 2014-01-28 | Discharge: 2014-01-28 | Disposition: A | Discharge status: Home / Self Care | Payer: Medicare Other | Source: Ambulatory Visit | Attending: Cardiothoracic Surgery | Admitting: Cardiothoracic Surgery

## 2014-01-28 DIAGNOSIS — C3430 Malignant neoplasm of lower lobe, unspecified bronchus or lung: Secondary | ICD-10-CM | POA: Insufficient documentation

## 2014-01-28 LAB — PRO B NATRIURETIC PEPTIDE: Pro B Natriuretic peptide (BNP): 3400 pg/mL — ABNORMAL HIGH (ref <126)

## 2014-01-28 NOTE — [facility] Oncology Navigator Note (Unsigned)
[employer] request faxed to [facility] Pathology dept

## 2014-01-31 NOTE — Progress Notes (Signed)
[address]       [place] [address]             [phone]                    Susan Pittman Impact Medical Record #[id number] Date of Birth: 26-Jan-1941  Referring HY:WVPX, Christena Deem, MD Primary Cardiology:Dr Gwenlyn Found Primary Care:Katherine [name], MD  Chief Complaint:   PostOp Follow Up Visit 12/15/2013  OPERATIVE REPORT  PREOPERATIVE DIAGNOSIS: Left lower lobe lung mass.  POSTOPERATIVE DIAGNOSIS: Left lower lobe lung mass. Adenocarcinoma by  frozen section.  PROCEDURE PERFORMED: Bronchoscopy, left video-assisted thoracoscopy,  with wedge resection for biopsy of left lower lobe lesion, completion  left lower lobectomy, lymph node dissection, placement of On-Q device.  SURGEON: Lanelle Bal, MD   Diagnosis: GMS stain demonstrates abundant microorganisms present within the hyalinized nodules with morphology consistent with Histoplasmosis capsulatum. 1. Lung, wedge biopsy/resection, Left lower lobe - INVASIVE ADENOCARCINOMA SEE COMMENT. - TUMOR INVOLVES SURGICAL MARGIN. - NO LYMPHOVASCULAR INVASION IDENTIFIED. - SEE TUMOR SYNOPTIC TEMPLATE BELOW. 2. Lymph node, biopsy, 8 node - ONE LYMPH NODE, NEGATIVE FOR TUMOR (0/1), SEE COMMENT. 3. Lymph node, biopsy, 11 L - ONE LYMPH NODE, NEGATIVE FOR TUMOR (0/1). 4. Lung, resection (segmental or lobe), Left lower - BENIGN LUNG, SEE COMMENT. - NEGATIVE FOR ATYPIA OR MALIGNANCY. - BRONCHOVASCULAR MARGIN, NEGATIVE FOR ATYPIA OR MALIGNANCY. 5. Lymph node, biopsy, 10 node - ONE LYMPH NODE, NEGATIVE FOR TUMOR (0/1). 6. Lymph node, biopsy, 12 node - ONE LYMPH NODE, NEGATIVE FOR TUMOR (0/1). Microscopic Comment 1. LUNG 1 of 3 FINAL for Susan Pittman, Susan Pittman ([id number]) Microscopic Comment(continued) Specimen, including laterality: Left lower lobe (parts 1 and 4). Procedure: Wedge resection and lobectomy Specimen integrity (intact/disrupted): Intact Tumor site: Subpleural Maximum tumor size (cm): 1.7  cm Histologic type: Adenocarcinoma (acinar and lepidic subtypes) Grade: I/well differentiated Margins: Present at margins, see comment. Distance to closest margin (cm): Present at margin Visceral pleura invasion: Absent Tumor extension: Tumor is confined to subpleural parenchyma Treatment effect (if treated with neoadjuvant therapy): None Lymph -Vascular invasion: Absent Lymph nodes: Number examined - 4; Number N1 nodes positive - 0; Number N2 nodes positive - 0 TNM code: pT1a, pN0, pMX Ancillary studies: Can be performed upon request Non-neoplastic lung: See part 4. Comments: The final surgical margin is part 4. 2. Sections of lymph node demonstrate multiple foci of extensively hyalinized and calcified intranodal nodules. AFB, PAS and GMS stains are pending and will be reported in an addendum. 4. There is no tumor grossly identified. Representative sections of the lung demonstrate non-neoplastic findings to include minimal chronic interstitial inflammation with rare epithelioid granuloma, anthracotic pigment deposition, minimal peribronchiolar chronic inflammation, Langerhans cell histiocytic nodules, and an incidental 3 mm subpleural minute pulmonary meningothelial-like nodule (chemodectoma). The chemodectoma is an incidental, benign finding of no prognostic significance. There are no features of epithelial dysplasia or malignancy present. (CR:kh 12-16-13) [place] RUND DO  History of Present Illness:     Patient returns to the office today after left lower lobectomy for stage I adenocarcinoma the lung. She has had some mild pedal edema. She had had some cough and low-grade fever and was started on by mouth Augmentin by her primary care doctor. She's also been started on Lasix 20 mg a day  History  Smoking status  . Never Smoker   Smokeless tobacco  . Never Used       Allergies  Allergen  Reactions  . Benazepril Hcl Anaphylaxis and Cough  . Loratadine Anaphylaxis    anaphylaxis   . Celecoxib     REACTION: aching  . Lisinopril     REACTION: cough  . Allegra [Fexofenadine Hcl]     Severe back pain  . Sulfa Antibiotics Hives    Current Outpatient Prescriptions  Medication Sig Dispense Refill  . albuterol (PROVENTIL HFA;VENTOLIN HFA) 108 (90 BASE) MCG/ACT inhaler Inhale 1-2 puffs into the lungs every 6 (six) hours as needed for wheezing or shortness of breath.  18 g  1  . amLODipine (NORVASC) 10 MG tablet Take 10 mg by mouth daily.      Marland Kitchen amoxicillin-clavulanate (AUGMENTIN) 875-125 MG per tablet Take 1 tablet by mouth 2 (two) times daily.  20 tablet  0  . Calcium 600-200 MG-UNIT per tablet Take 1 tablet by mouth 2 (two) times daily. Takes at [employer]      . cetirizine (ZYRTEC) 10 MG tablet Take 10 mg by mouth at bedtime.       Marland Kitchen etodolac (LODINE) 400 MG tablet TAKE ONE TABLET BY MOUTH TWICE DAILY  120 tablet  0  . fluticasone (FLONASE) 50 MCG/ACT nasal spray Place 2 sprays into both nostrils daily as needed for allergies or rhinitis.      . furosemide (LASIX) 20 MG tablet Take 20 mg by mouth 2 (two) times daily.      . lansoprazole (PREVACID) 15 MG capsule Take 15 mg by mouth daily.        Marland Kitchen levothyroxine (SYNTHROID, LEVOTHROID) 88 MCG tablet Take 88 mcg by mouth daily before breakfast.      . Multiple Vitamins-Minerals (MULTI FOR HER 50+ PO) Take 1 tablet by mouth daily.       . Nebivolol HCl (BYSTOLIC) 20 MG TABS Take 20 mg by mouth every morning.      . valsartan (DIOVAN) 160 MG tablet Take 1 tablet (160 mg total) by mouth daily.  30 tablet  6   No current facility-administered medications for this visit.       Physical Exam: BP 148/76  Pulse 67  Resp 20  Ht 5\' 5"  (1.651 m)  Wt 248 lb (112.492 kg)  BMI 41.27 kg/m2  SpO2 94%  General appearance: alert and cooperative Neurologic: intact Heart: regular rate and rhythm, S1, S2 normal, no murmur, click, rub or gallop Lungs: diminished breath sounds LLL Abdomen: soft, non-tender; bowel sounds  normal; no masses,  no organomegaly Extremities: extremities normal, atraumatic, no cyanosis , Homans sign is negative, no sign of DVT mild bilateral pedal edema Wound: The larger incision and anterior chest tube site are well-healed. Chest tube site is now completely healed the other incisions are totally healed without evidence of infection  Diagnostic Studies & Laboratory data:         Recent Radiology Findings:  Dg Chest 2 View  01/27/2014   CLINICAL DATA:  Mild dyspnea, left pleural effusion.  EXAM: CHEST  2 VIEW  COMPARISON:  January 18, 2014.  FINDINGS: Stable cardiomediastinal silhouette. Stable mild right pleural effusion. Left basilar opacity is unchanged compared to prior exam, concerning for pneumonia or atelectasis with associated pleural effusion. Status post surgical fusion of lower cervical spine. No pneumothorax is noted.  IMPRESSION: Stable mild right pleural effusion. Stable left basilar opacity is noted concerning for pneumonia or atelectasis with associated pleural effusion.   Electronically Signed   By: Sabino Dick M.D.   On: 01/27/2014 13:49   Recent  Labs: Lab Results  Component Value Date   WBC 11.2* 01/18/2014   HGB 10.1* 01/18/2014   HCT 30.7* 01/18/2014   PLT 351.0 01/18/2014   GLUCOSE 131* 01/25/2014   CHOL 180 10/13/2013   TRIG 105 10/13/2013   HDL 52 10/13/2013   LDLDIRECT 126.3 09/10/2012   LDLCALC 107* 10/13/2013   ALT 17 12/17/2013   AST 22 12/17/2013   NA 141 01/25/2014   K 5.1 01/25/2014   CL 105 01/25/2014   CREATININE 2.46* 01/25/2014   BUN 49* 01/25/2014   CO2 26 01/25/2014   TSH 3.158 10/13/2013   INR 1.06 12/13/2013   BNP have increased since  3/17 371  To 5088 to 3460 CR has increased  To 2.46 now ,  1.2 on 2/13  Assessment / Plan:    Decreasing renal function , with increased CR and BNP, being followed by Dr Claiborne Billings & Gwenlyn Found for recent renal a stent Plan to see her back again in 3 weeks with followup chest x-ray Patient's case was discussed in [facility]  conference  we will make her an appointment to see Dr. Earlie Server to discuss treatment options and possible studies for stage I adenocarcinoma the lung. Patient will be referred Physical Therapy at [facility] 01/29/2023

## 2014-02-02 ENCOUNTER — Other Ambulatory Visit: Payer: Self-pay | Admitting: Family Medicine

## 2014-02-02 DIAGNOSIS — N19 Unspecified kidney failure: Secondary | ICD-10-CM

## 2014-02-03 ENCOUNTER — Other Ambulatory Visit: Payer: Self-pay | Admitting: Family Medicine

## 2014-02-03 ENCOUNTER — Other Ambulatory Visit (INDEPENDENT_AMBULATORY_CARE_PROVIDER_SITE_OTHER): Payer: Medicare Other

## 2014-02-03 DIAGNOSIS — N19 Unspecified kidney failure: Secondary | ICD-10-CM

## 2014-02-03 DIAGNOSIS — R945 Abnormal results of liver function studies: Principal | ICD-10-CM

## 2014-02-03 DIAGNOSIS — R7989 Other specified abnormal findings of blood chemistry: Secondary | ICD-10-CM

## 2014-02-03 LAB — BASIC METABOLIC PANEL
BUN: 44 mg/dL — ABNORMAL HIGH (ref 6–23)
CHLORIDE: 104 meq/L (ref 96–112)
CO2: 23 meq/L (ref 19–32)
CREATININE: 2.6 mg/dL — AB (ref 0.4–1.2)
Calcium: 8.3 mg/dL — ABNORMAL LOW (ref 8.4–10.5)
GFR: 19.32 mL/min — ABNORMAL LOW (ref >60.00)
Glucose, Bld: 101 mg/dL — ABNORMAL HIGH (ref 70–99)
POTASSIUM: 3.6 meq/L (ref 3.5–5.1)
SODIUM: 138 meq/L (ref 135–145)

## 2014-02-04 ENCOUNTER — Encounter: Payer: Self-pay | Admitting: Cardiology

## 2014-02-04 ENCOUNTER — Telehealth: Payer: Self-pay | Admitting: Cardiovascular Disease

## 2014-02-04 ENCOUNTER — Ambulatory Visit (INDEPENDENT_AMBULATORY_CARE_PROVIDER_SITE_OTHER): Payer: Medicare Other | Admitting: Cardiovascular Disease

## 2014-02-04 VITALS — BP 150/80 | HR 60 | Ht 65.0 in | Wt 247.3 lb

## 2014-02-04 DIAGNOSIS — R609 Edema, unspecified: Secondary | ICD-10-CM

## 2014-02-04 DIAGNOSIS — I5031 Acute diastolic (congestive) heart failure: Secondary | ICD-10-CM

## 2014-02-04 DIAGNOSIS — R0602 Shortness of breath: Secondary | ICD-10-CM

## 2014-02-04 DIAGNOSIS — I2510 Atherosclerotic heart disease of native coronary artery without angina pectoris: Secondary | ICD-10-CM

## 2014-02-04 DIAGNOSIS — Z79899 Other long term (current) drug therapy: Secondary | ICD-10-CM | POA: Diagnosis not present

## 2014-02-04 DIAGNOSIS — R600 Localized edema: Secondary | ICD-10-CM | POA: Insufficient documentation

## 2014-02-04 DIAGNOSIS — E785 Hyperlipidemia, unspecified: Secondary | ICD-10-CM

## 2014-02-04 DIAGNOSIS — I10 Essential (primary) hypertension: Secondary | ICD-10-CM

## 2014-02-04 DIAGNOSIS — I701 Atherosclerosis of renal artery: Secondary | ICD-10-CM

## 2014-02-04 DIAGNOSIS — R911 Solitary pulmonary nodule: Secondary | ICD-10-CM

## 2014-02-04 DIAGNOSIS — C3430 Malignant neoplasm of lower lobe, unspecified bronchus or lung: Secondary | ICD-10-CM

## 2014-02-04 DIAGNOSIS — N184 Chronic kidney disease, stage 4 (severe): Secondary | ICD-10-CM

## 2014-02-04 MED ORDER — HYDRALAZINE HCL 25 MG PO TABS: 25.0000 mg | ORAL_TABLET | Freq: Three times a day (TID) | ORAL | Status: DC

## 2014-02-04 NOTE — Patient Instructions (Signed)
Your physician recommends that you schedule a follow-up appointment in:  2 weeks and see Dr Claiborne Billings  Stop taking Amlodipine/Norvasc  Stop taking Diovan/ Valsartan  Start taking Hydralazine 25 mg Three times a day    Have your bloodwork done in 7 to 10 days

## 2014-02-04 NOTE — Telephone Encounter (Signed)
Was in this morning and calling about a prescription that was suppose to be sent to her pharmacy , she states its a new prescription for her and all she knows is that it starts with the  Letter H . She went to pick up the medication and the pharmacy states that they do not have it as of yet. Please Call   Thanks

## 2014-02-04 NOTE — Telephone Encounter (Signed)
Message forwarded to [facility]. Tressie Stalker, LPN.

## 2014-02-04 NOTE — Assessment & Plan Note (Signed)
Patent on cardiac cath in feb.

## 2014-02-04 NOTE — Telephone Encounter (Signed)
Pharmacy called and med is there for pick up

## 2014-02-04 NOTE — Assessment & Plan Note (Signed)
No chest pain

## 2014-02-04 NOTE — Assessment & Plan Note (Signed)
No further heart failure, lungs clear but continued lower ext edema.  Pt and her husband are very frustrated. They do not feel she is improving, though she has at least somewhat, but I understand frustration.  Dr. Claiborne Billings saw the patient as well please see his note.  We left lasix the same,  Stopped the amlodipine, stopped the diovan and will recheck BMet in 7-10 days.  For her HTN we will begin Hydralazine at 25 mg TID. She will follow up with Dr. Claiborne Billings in 2 weeks.   She has been referred to renal by PCP.

## 2014-02-04 NOTE — Progress Notes (Signed)
02/04/2014   PCP: [name], MD   Chief Complaint  Patient presents with  . Follow-up    1 week follow up, still having swelling in feet and now hands. dyspnea worse today than last week. weight increasing.     Primary Cardiologist:  Dr. Claiborne Billings   HPI:  73 yo WF with recent cardiac cath 12/12/13 with non obstructive disease and 2 D echo done in 12/14 with normal EF and grade II diastolic dysfunction. Also hx or RAS 80% rt with successful right renal artery PTA and stent using a balloon expandable stent 11/08/13, patent on cath in Feb. 2015. She continues with HTN and diastolic HF.   In Feb.2015 she underwent VATS procedure for lung nodules with LL lobectomy by Dr. Servando Snare and diagnosed with INVASIVE ADENOCARCINOMA. She had a superficial wound infection post op and treated with Keflex. More recently has developed SOB and repeat CXR 01/18/14 with: No significant change in left basilar opacity compared to chest radiograph 01/06/2014. This is worrisome for pneumonia or Aspiration. Suspect mild congestive heart failure pattern. There is new pulmonary vascular congestion and mild diffuse interstitial prominence with small bilateral pleural effusions. Lasix 20 mg daily was added and an antibiotic. She has less SOB now but edema continues. No chest pain except for surgical pain.   She presents back today for follow up.  She has significant edema of lower ext from mid calf down.  Her SOB has resolved though today she felt SOB, may be due to Pollen.  She and her husband are stressed with how little she has improved over the last week.  They are frustrated and certainly dealing with many issues.  They have been referred to renal due to increasing renal failure.  With these issues, Dr. Claiborne Billings was in the office today and he also saw the patient.  Please see his note.    Allergies  Allergen Reactions  . Benazepril Hcl Anaphylaxis and Cough  . Loratadine Anaphylaxis    anaphylaxis  .  Celecoxib     REACTION: aching  . Lisinopril     REACTION: cough  . Allegra [Fexofenadine Hcl]     Severe back pain  . Sulfa Antibiotics Hives    Current Outpatient Prescriptions  Medication Sig Dispense Refill  . albuterol (PROVENTIL HFA;VENTOLIN HFA) 108 (90 BASE) MCG/ACT inhaler Inhale 1-2 puffs into the lungs every 6 (six) hours as needed for wheezing or shortness of breath.  18 g  1  . amLODipine (NORVASC) 10 MG tablet Take 10 mg by mouth daily.      Marland Kitchen amoxicillin-clavulanate (AUGMENTIN) 875-125 MG per tablet Take 1 tablet by mouth 2 (two) times daily.  20 tablet  0  . Calcium 600-200 MG-UNIT per tablet Take 1 tablet by mouth 2 (two) times daily. Takes at [employer]      . cetirizine (ZYRTEC) 10 MG tablet Take 10 mg by mouth at bedtime.       Marland Kitchen etodolac (LODINE) 400 MG tablet TAKE ONE TABLET BY MOUTH TWICE DAILY  120 tablet  0  . fluticasone (FLONASE) 50 MCG/ACT nasal spray Place 2 sprays into both nostrils daily as needed for allergies or rhinitis.      . furosemide (LASIX) 20 MG tablet Take 20 mg by mouth 2 (two) times daily.      . lansoprazole (PREVACID) 15 MG capsule Take 15 mg by mouth daily.        Marland Kitchen levothyroxine (  SYNTHROID, LEVOTHROID) 88 MCG tablet Take 88 mcg by mouth daily before breakfast.      . Multiple Vitamins-Minerals (MULTI FOR HER 50+ PO) Take 1 tablet by mouth daily.       . Nebivolol HCl (BYSTOLIC) 20 MG TABS Take 20 mg by mouth every morning.      . valsartan (DIOVAN) 160 MG tablet Take 1 tablet (160 mg total) by mouth daily.  30 tablet  6  . hydrALAZINE (APRESOLINE) 25 MG tablet Take 1 tablet (25 mg total) by mouth 3 (three) times daily.  270 tablet  3   No current facility-administered medications for this visit.    Past Medical History  Diagnosis Date  . Asthma   . Hypertension   . GERD (gastroesophageal reflux disease)   . Allergy   . Hypothyroid   . Osteoporosis   . Gastric ulcer   . Hiatal hernia   . Hyperlipidemia     "don't take RX for  it" (11/08/2013)  . Renal artery stenosis   . Lung mass     superior segment LLL/notes 11/08/2013  . Pneumonia     "I've had it ?3 times" (11/08/2013)  . Arthritis     "knees, hips, back, hands" (11/08/2013)  . DJD (degenerative joint disease)   . Basal cell carcinoma     "cut them off face & right shoulder" (11/08/2013)  . Lung nodule/ adenoca > LLobectomy 12/15/13  11/08/2013    PET 11/23/2013 1. Dominant sub solid left lower lobe nodule does not demonstrate significantly increased metabolic activity. However, morphologically, adenocarcinoma is still a concern.   - Spirometry 11/24/13 wnl  - 11/24/2013 T surg eval rec> LLower lobectomy 12/15/2013 for adenoca   . CAD (coronary artery disease), non obstructive by cardiac cath 12/12/13 01/24/2014    Past Surgical History  Procedure Laterality Date  . Knee arthroscopy Bilateral   . Anterior cervical decomp/discectomy fusion  2000    C5-C6  . Renal artery stent Right 11/08/2013    Archie Endo 11/08/2013  . Tonsillectomy and adenoidectomy  ?1946  . Cholecystectomy  1970's  . Vaginal hysterectomy  1970  . Dilation and curettage of uterus  1960's  . Skin cancer excision    . Coronary angiogram      Hx: of 2/ 2015  . Cardiac catheterization      Hx: of 2/ 2015  . Video bronchoscopy N/A 12/15/2013    Procedure: VIDEO BRONCHOSCOPY;  Surgeon: Grace Isaac, MD;  Location: [facility];  Service: Thoracic;  Laterality: N/A;  . Video assisted thoracoscopy (vats)/wedge resection Left 12/15/2013    Procedure: VIDEO ASSISTED THORACOSCOPY (VATS)/WEDGE RESECTION;  Surgeon: Grace Isaac, MD;  Location: [place];  Service: Thoracic;  Laterality: Left;  . Lobectomy Left 12/15/2013    Procedure: LOBECTOMY;  Surgeon: Grace Isaac, MD;  Location: [facility];  Service: Thoracic;  Laterality: Left;    SWF:UXNATFT:DD colds or fevers, 1 lb weight loss despite increase of lasix Skin:no rashes or ulcers HEENT:no blurred vision, no congestion CV:see HPI PUL:see HPI GI:no diarrhea  constipation or melena, no indigestion GU:no hematuria, no dysuria MS:no joint pain, no claudication Neuro:no syncope, no lightheadedness Endo:no diabetes, + thyroid disease  PHYSICAL EXAM BP 150/80  Pulse 60  Ht 5\' 5"  (1.651 m)  Wt 247 lb 4.8 oz (112.175 kg)  BMI 41.15 kg/m2 General:Pleasant affect, NAD, anxious about her condition Skin:Warm and dry, brisk capillary refill HEENT:normocephalic, sclera clear, mucus membranes moist Neck:supple, no JVD, no bruits  Heart:S1S2  RRR without murmur, gallup, rub or click Lungs:clear without rales, rhonchi, or wheezes TAV:WPVXY, soft, non tender, + BS, do not palpate liver spleen or masses Ext:2+ lower ext edema to mid calf bilaterally, 2+ pedal pulses, 2+ radial pulses Neuro:alert and oriented, MAE, follows commands, + facial symmetry   ASSESSMENT AND PLAN Acute diastolic HF (heart failure) No further heart failure, lungs clear but continued lower ext edema.  Pt and her husband are very frustrated. They do not feel she is improving, though she has at least somewhat, but I understand frustration.  Dr. Claiborne Billings saw the patient as well please see his note.  We left lasix the same,  Stopped the amlodipine, stopped the diovan and will recheck BMet in 7-10 days.  For her HTN we will begin Hydralazine at 25 mg TID. She will follow up with Dr. Claiborne Billings in 2 weeks.   She has been referred to renal by PCP.  HYPERTENSION poorly controlled see above note  Edema of both legs See above note.    Renal artery atherosclerosis, s/p LRA stent 11/08/13 Patent on cardiac cath in feb.  Lung nodule/ adenoca > LLobectomy 12/15/13  - 11/24/2013 T surg eval rec> LLower lobectomy 12/15/2013 for adenoca Followed by Dr. Servando Snare.  CAD (coronary artery disease), non obstructive by cardiac cath 12/12/13 No chest pain    Patient seen and examined. Agree with assessment and plan. I had a long discussion with both the patient and her husband. I reviewed her hospital records  since I had last seen the patient in December. She has subsequently undergone right renal artery stenting, cardiac catheterization, and lung surgery for adenocarcinoma. At catheterization done by Dr. Angelena Form, her renal artery stent was widely patent. She's had continued to have difficulty with progressive lower extremity edema. I suspect a lot of her significant edema may be secondary to her high dose amlodipine. She also has developed worsening renal function with her creatinine being 1.04 on February 12, 1.21 on February13, but has risen to 2.0 on March 17, 2.41 on March 23, and yesterday 2.6. I have recommended she discontinue her valsartan. I will discontinue her amlodipine. I will start her on hydralazine at 25 mg every 8 hours and ultimately this dose may need to be increased depending upon her blood pressure response. I am recommending followup laboratory be obtained in a proximally 1 week. I will see her in 2 weeks for followup evaluation   Troy Sine, MD, [facility] 02/04/2014 1:57 PM

## 2014-02-04 NOTE — Assessment & Plan Note (Signed)
-   11/24/2013 T surg eval rec> LLower lobectomy 12/15/2013 for adenoca Followed by Dr. Servando Snare.

## 2014-02-04 NOTE — Assessment & Plan Note (Signed)
poorly controlled see above note

## 2014-02-04 NOTE — Assessment & Plan Note (Signed)
See above note

## 2014-02-09 ENCOUNTER — Other Ambulatory Visit: Payer: Self-pay | Admitting: *Deleted

## 2014-02-09 DIAGNOSIS — C3490 Malignant neoplasm of unspecified part of unspecified bronchus or lung: Secondary | ICD-10-CM

## 2014-02-11 ENCOUNTER — Ambulatory Visit (INDEPENDENT_AMBULATORY_CARE_PROVIDER_SITE_OTHER): Payer: Medicare Other | Admitting: Internal Medicine

## 2014-02-11 ENCOUNTER — Encounter: Payer: Self-pay | Admitting: Internal Medicine

## 2014-02-11 ENCOUNTER — Telehealth: Payer: Self-pay | Admitting: Cardiovascular Disease

## 2014-02-11 VITALS — BP 142/86 | HR 66 | Ht 65.0 in | Wt 249.6 lb

## 2014-02-11 DIAGNOSIS — E785 Hyperlipidemia, unspecified: Secondary | ICD-10-CM

## 2014-02-11 DIAGNOSIS — N179 Acute kidney failure, unspecified: Secondary | ICD-10-CM | POA: Diagnosis not present

## 2014-02-11 DIAGNOSIS — Z79899 Other long term (current) drug therapy: Secondary | ICD-10-CM | POA: Diagnosis not present

## 2014-02-11 DIAGNOSIS — R0989 Other specified symptoms and signs involving the circulatory and respiratory systems: Secondary | ICD-10-CM

## 2014-02-11 DIAGNOSIS — R0602 Shortness of breath: Secondary | ICD-10-CM | POA: Diagnosis not present

## 2014-02-11 DIAGNOSIS — I10 Essential (primary) hypertension: Secondary | ICD-10-CM

## 2014-02-11 DIAGNOSIS — R0609 Other forms of dyspnea: Secondary | ICD-10-CM | POA: Diagnosis not present

## 2014-02-11 DIAGNOSIS — R609 Edema, unspecified: Secondary | ICD-10-CM

## 2014-02-11 DIAGNOSIS — I2510 Atherosclerotic heart disease of native coronary artery without angina pectoris: Secondary | ICD-10-CM | POA: Diagnosis not present

## 2014-02-11 DIAGNOSIS — R600 Localized edema: Secondary | ICD-10-CM

## 2014-02-11 MED ORDER — FUROSEMIDE 40 MG PO TABS: 40.0000 mg | ORAL_TABLET | Freq: Every day | ORAL | Status: DC

## 2014-02-11 MED ORDER — FUROSEMIDE 40 MG PO TABS: 40.0000 mg | ORAL_TABLET | Freq: Two times a day (BID) | ORAL | Status: DC

## 2014-02-11 NOTE — Telephone Encounter (Signed)
Has gained weight in the last two days and have had a lot of swelling in her feet and legs and want to know if there is anything else she needs to do ?  Please Call    Thanks

## 2014-02-11 NOTE — Telephone Encounter (Signed)
Spoke w/ Cecilie Kicks, NP and advised pt come in to see MD as pt may need admission b/c of kidney failure.  Also stated pt/husband expressed they did not want to see anyone except an MD.    Pt added on to DOD schedule today and 3:30pm.  Dr. Debara Pickett and Eliezer Lofts, RN notified.

## 2014-02-11 NOTE — Progress Notes (Addendum)
02/11/2014   PCP: [name], MD   Chief Complaint  Patient presents with  . Shortness of breath, leg swelling    saw Dr. Claiborne Billings 1 week ago - audible wheezing/SOB/sleeping in recliner, LE edema x1 month - worse since lunch surgery in middle february; reports some weight gain    Primary Cardiologist:  Dr. Claiborne Billings  HPI:  73 yo WF with recent cardiac cath 12/12/13 with non obstructive disease and 2 D echo done in 12/14 with normal EF and grade II diastolic dysfunction. Also hx or RAS 80% rt with successful right renal artery PTA and stent using a balloon expandable stent 11/08/13, patent on cath in Feb. 2015. She continues with HTN and diastolic HF.   In Feb.2015 she underwent VATS procedure for lung nodules with LL lobectomy by Dr. Servando Snare and diagnosed with INVASIVE ADENOCARCINOMA. She had a superficial wound infection post op and treated with Keflex. More recently has developed SOB and repeat CXR 01/18/14 with: No significant change in left basilar opacity compared to chest radiograph 01/06/2014. This is worrisome for pneumonia or Aspiration. Suspect mild congestive heart failure pattern. There is new pulmonary vascular congestion and mild diffuse interstitial prominence with small bilateral pleural effusions. Lasix 20 mg daily was added and an antibiotic. She has less SOB now but edema continues. No chest pain except for surgical pain.   She presents back today for follow up.  She has significant edema of lower ext from mid calf down.  Her SOB has resolved though today she felt SOB, may be due to Pollen.  She and her husband are stressed with how little she has improved over the last week.  They are frustrated and certainly dealing with many issues.  They have been referred to renal due to increasing renal failure.  With these issues, Dr. Claiborne Billings was in the office today and he also saw the patient.  Please see his note.   Mrs. Ansley presents today for an acute add-on appointment. She is  concerned about increasing shortness of breath and lower extremity swelling. She's had significant weight gain. She was seen last week however no changes to her diuretics were made. Her creatinine continues to climb and is concerning for primary renal failure of unknown etiology. She does have significant leg edema, which almost looks like nephrotic syndrome. She reports worsening shortness of breath and wheezing. She thought that she would feel better after her surgery for lung cancer, but continues to feel worse.  Allergies  Allergen Reactions  . Benazepril Hcl Anaphylaxis and Cough  . Loratadine Anaphylaxis    anaphylaxis  . Celecoxib     REACTION: aching  . Lisinopril     REACTION: cough  . Allegra [Fexofenadine Hcl]     Severe back pain  . Sulfa Antibiotics Hives    Current Outpatient Prescriptions  Medication Sig Dispense Refill  . albuterol (PROVENTIL HFA;VENTOLIN HFA) 108 (90 BASE) MCG/ACT inhaler Inhale 1-2 puffs into the lungs every 6 (six) hours as needed for wheezing or shortness of breath.  18 g  1  . aspirin EC 81 MG tablet Take 81 mg by mouth daily.      . Calcium 600-200 MG-UNIT per tablet Take 1 tablet by mouth 2 (two) times daily. Takes at [employer]      . cetirizine (ZYRTEC) 10 MG tablet Take 10 mg by mouth at bedtime.       Marland Kitchen etodolac (LODINE) 400 MG tablet TAKE ONE TABLET BY MOUTH  TWICE DAILY  120 tablet  0  . fluticasone (FLONASE) 50 MCG/ACT nasal spray Place 2 sprays into both nostrils daily as needed for allergies or rhinitis.      . hydrALAZINE (APRESOLINE) 25 MG tablet Take 1 tablet (25 mg total) by mouth 3 (three) times daily.  270 tablet  3  . lansoprazole (PREVACID) 15 MG capsule Take 15 mg by mouth daily.        Marland Kitchen levothyroxine (SYNTHROID, LEVOTHROID) 88 MCG tablet Take 88 mcg by mouth daily before breakfast.      . Multiple Vitamins-Minerals (MULTI FOR HER 50+ PO) Take 1 tablet by mouth daily.       . Nebivolol HCl (BYSTOLIC) 20 MG TABS Take 20 mg by  mouth every morning.      . furosemide (LASIX) 40 MG tablet Take 1 tablet (40 mg total) by mouth daily.  30 tablet  6   No current facility-administered medications for this visit.    Past Medical History  Diagnosis Date  . Asthma   . Hypertension   . GERD (gastroesophageal reflux disease)   . Allergy   . Hypothyroid   . Osteoporosis   . Gastric ulcer   . Hiatal hernia   . Hyperlipidemia     "don't take RX for it" (11/08/2013)  . Renal artery stenosis   . Lung mass     superior segment LLL/notes 11/08/2013  . Pneumonia     "I've had it ?3 times" (11/08/2013)  . Arthritis     "knees, hips, back, hands" (11/08/2013)  . DJD (degenerative joint disease)   . Basal cell carcinoma     "cut them off face & right shoulder" (11/08/2013)  . Lung nodule/ adenoca > LLobectomy 12/15/13  11/08/2013    PET 11/23/2013 1. Dominant sub solid left lower lobe nodule does not demonstrate significantly increased metabolic activity. However, morphologically, adenocarcinoma is still a concern.   - Spirometry 11/24/13 wnl  - 11/24/2013 T surg eval rec> LLower lobectomy 12/15/2013 for adenoca   . CAD (coronary artery disease), non obstructive by cardiac cath 12/12/13 01/24/2014    Past Surgical History  Procedure Laterality Date  . Knee arthroscopy Bilateral   . Anterior cervical decomp/discectomy fusion  2000    C5-C6  . Renal artery stent Right 11/08/2013    Archie Endo 11/08/2013  . Tonsillectomy and adenoidectomy  ?1946  . Cholecystectomy  1970's  . Vaginal hysterectomy  1970  . Dilation and curettage of uterus  1960's  . Skin cancer excision    . Coronary angiogram      Hx: of 2/ 2015  . Cardiac catheterization      Hx: of 2/ 2015  . Video bronchoscopy N/A 12/15/2013    Procedure: VIDEO BRONCHOSCOPY;  Surgeon: Grace Isaac, MD;  Location: [facility] OR;  Service: Thoracic;  Laterality: N/A;  . Video assisted thoracoscopy (vats)/wedge resection Left 12/15/2013    Procedure: VIDEO ASSISTED THORACOSCOPY (VATS)/WEDGE  RESECTION;  Surgeon: Grace Isaac, MD;  Location: [place];  Service: Thoracic;  Laterality: Left;  . Lobectomy Left 12/15/2013    Procedure: LOBECTOMY;  Surgeon: Grace Isaac, MD;  Location: [place];  Service: Thoracic;  Laterality: Left;    IRW:ERXVQMG:QQ colds or fevers, 2 lb weight gain Skin:no rashes or ulcers HEENT:no blurred vision, no congestion CV:see HPI PUL:see HPI GI:no diarrhea constipation or melena, no indigestion GU:no hematuria, no dysuria MS:no joint pain, no claudication Neuro:no syncope, no lightheadedness Endo:no diabetes, + thyroid disease  PHYSICAL  EXAM BP 142/86  Pulse 66  Ht 5\' 5"  (1.651 m)  Wt 249 lb 9.6 oz (113.218 kg)  BMI 41.54 kg/m2 General:Pleasant affect, anxious about her condition Skin:Warm and dry, brisk capillary refill HEENT:normocephalic, sclera clear, mucus membranes moist Neck:supple, no JVD, no bruits  Heart:S1S2 RRR without murmur, gallup, rub or click Lungs:clear without rales, no breath sounds in the LLL, upper airway wheezes KZL:DJTTS, soft, non tender, + BS, do not palpate liver spleen or masses Ext:2+ lower ext edema to mid calf bilaterally, 2+ pedal pulses, 2+ radial pulses Neuro:alert and oriented, MAE, follows commands, + facial symmetry  ASSESSMENT AND PLAN Patient Active Problem List   Diagnosis Date Noted  . DOE (dyspnea on exertion) 02/11/2014  . Leg edema 02/11/2014  . Acute renal failure 02/11/2014  . Edema of both legs 02/04/2014  . Acute diastolic HF (heart failure) 01/24/2014  . CAD (coronary artery disease), non obstructive by cardiac cath 12/12/13 01/24/2014  . SOB (shortness of breath) 01/18/2014  . CKD (chronic kidney disease) stage 4, GFR 15-29 ml/min 01/03/2014  . Lung cancer, left lower lobe 12/17/2013  . Status post lobectomy of lung 12/15/2013  . Chest pain with moderate risk of acute coronary syndrome 12/12/2013  . Family history of coronary artery bypass surgery 12/12/2013  . Renal artery  atherosclerosis, s/p LRA stent 11/08/13 11/08/2013  . Lung nodule/ adenoca > LLobectomy 12/15/13  11/08/2013  . Premature atrial contractions 10/31/2013  . Hearing loss secondary to cerumen impaction 09/20/2013  . Allergic rhinitis 02/11/2013  . Shoulder pain 03/11/2012  . Upper arm pain 01/26/2012  . General medical examination 07/25/2011  . Obesity 05/10/2011  . Dizziness 04/10/2011  . Hyperlipidemia 02/28/2011  . EDEMA- LOCALIZED 06/04/2010  . OSTEOPENIA 01/15/2010  . KNEE PAIN 11/10/2009  . CARCINOMA, BASAL CELL 09/22/2009  . SKIN LESION 09/18/2009  . ESOPHAGITIS 05/19/2009  . GASTRIC ULCER 05/19/2009  . HIATAL HERNIA 05/19/2009  . DEGENERATIVE JOINT DISEASE 05/19/2009  . HYPOTHYROIDISM 03/23/2009  . HYPERTENSION 03/23/2009  . ASTHMA 03/23/2009  . GERD 03/23/2009  . COLONIC POLYPS, HX OF 03/23/2009   PLAN: Mrs. Baldwin has complaints of worsening shortness of breath and increasing lower extremity edema along with weight gain. There is no evidence for pulmonary edema on exam and there are some upper airway wheezes, which seem asthmatic. She said she had some improvement in the wheezing with a nebulizer today but still feels short of breath. There could be pulmonary vascular congestion. I recommend increasing her Lasix to 40 mg twice a day, but we must be careful in light of her worsening renal function.  Her lower extremity edema seems more consistent with a nephrotic syndrome.  I would like to check a 24-hour urine as well as a BNP and CMP next week. She has a followup appointment with Dr. Claiborne Billings on Tuesday. I understand she is also scheduled to see a nephrologist next week as well.  Pixie Casino, MD, [facility] Attending Cardiologist [facility]   02/11/2014 3:57 PM

## 2014-02-11 NOTE — Telephone Encounter (Signed)
Returned call and pt verified x 2.  Pt c/o wt gain over the last 2 days.  C/o swelling in her feet and it's not getting better.  Was down to 246.3 lbs  Today 249.1 lbs.  C/o SOB w/ exertion.  Pt denied change in LE swelling since OV last week.  Stated it has not changed.  SOB and wt gain are new.  Pt informed Cecilie Kicks, NP will be notified for further instructions and RN will call her back.  No changes were made to Lasix last week.  Of note, pt w/o audible difficulty breathing and stated she is sitting still now.  Message forwarded to L. Dorene Ar, NP for further instructions.

## 2014-02-11 NOTE — Patient Instructions (Signed)
Your physician recommends that you return for lab work on Monday April 13  Keep appointment with Dr. Claiborne Billings on Tuesday April 14  Your physician has recommended you make the following change in your medication: INCREASE furosemide to 40mg  twice daily.

## 2014-02-14 ENCOUNTER — Telehealth: Payer: Self-pay | Admitting: Cardiovascular Disease

## 2014-02-14 DIAGNOSIS — N179 Acute kidney failure, unspecified: Secondary | ICD-10-CM | POA: Diagnosis not present

## 2014-02-14 DIAGNOSIS — Z79899 Other long term (current) drug therapy: Secondary | ICD-10-CM | POA: Diagnosis not present

## 2014-02-14 DIAGNOSIS — R0602 Shortness of breath: Secondary | ICD-10-CM | POA: Diagnosis not present

## 2014-02-14 LAB — BRAIN NATRIURETIC PEPTIDE: BRAIN NATRIURETIC PEPTIDE: 271.8 pg/mL — AB (ref 0.0–100.0)

## 2014-02-14 LAB — COMPREHENSIVE METABOLIC PANEL
ALT: 8 U/L (ref 0–35)
AST: 13 U/L (ref 0–37)
Albumin: 2.8 g/dL — ABNORMAL LOW (ref 3.5–5.2)
Alkaline Phosphatase: 72 U/L (ref 39–117)
BILIRUBIN TOTAL: 0.8 mg/dL (ref 0.2–1.2)
BUN: 42 mg/dL — AB (ref 6–23)
CALCIUM: 8.4 mg/dL (ref 8.4–10.5)
CHLORIDE: 105 meq/L (ref 96–112)
CO2: 24 meq/L (ref 19–32)
CREATININE: 2.34 mg/dL — AB (ref 0.50–1.10)
Glucose, Bld: 79 mg/dL (ref 70–99)
Potassium: 4.6 mEq/L (ref 3.5–5.3)
Sodium: 140 mEq/L (ref 135–145)
Total Protein: 5.8 g/dL — ABNORMAL LOW (ref 6.0–8.3)

## 2014-02-14 LAB — PROTEIN, URINE, 24 HOUR
PROTEIN, URINE: 502 mg/dL
Protein, 24H Urine: 120 mg/d — ABNORMAL HIGH (ref 50–100)

## 2014-02-15 ENCOUNTER — Other Ambulatory Visit: Payer: Self-pay | Admitting: Cardiothoracic Surgery

## 2014-02-15 ENCOUNTER — Ambulatory Visit: Payer: Medicare Other | Admitting: Cardiovascular Disease

## 2014-02-15 DIAGNOSIS — C3430 Malignant neoplasm of lower lobe, unspecified bronchus or lung: Secondary | ICD-10-CM

## 2014-02-16 ENCOUNTER — Encounter: Payer: Self-pay | Admitting: Cardiology

## 2014-02-16 ENCOUNTER — Ambulatory Visit (INDEPENDENT_AMBULATORY_CARE_PROVIDER_SITE_OTHER): Payer: Medicare Other | Admitting: Cardiology

## 2014-02-16 VITALS — BP 170/80 | HR 80 | Ht 65.0 in | Wt 246.8 lb

## 2014-02-16 DIAGNOSIS — I701 Atherosclerosis of renal artery: Secondary | ICD-10-CM | POA: Diagnosis not present

## 2014-02-16 DIAGNOSIS — R600 Localized edema: Secondary | ICD-10-CM

## 2014-02-16 DIAGNOSIS — C3430 Malignant neoplasm of lower lobe, unspecified bronchus or lung: Secondary | ICD-10-CM | POA: Diagnosis not present

## 2014-02-16 DIAGNOSIS — I2510 Atherosclerotic heart disease of native coronary artery without angina pectoris: Secondary | ICD-10-CM | POA: Diagnosis not present

## 2014-02-16 DIAGNOSIS — R609 Edema, unspecified: Secondary | ICD-10-CM

## 2014-02-16 DIAGNOSIS — I519 Heart disease, unspecified: Secondary | ICD-10-CM

## 2014-02-16 DIAGNOSIS — I10 Essential (primary) hypertension: Secondary | ICD-10-CM

## 2014-02-16 DIAGNOSIS — I5189 Other ill-defined heart diseases: Secondary | ICD-10-CM | POA: Insufficient documentation

## 2014-02-16 DIAGNOSIS — N184 Chronic kidney disease, stage 4 (severe): Secondary | ICD-10-CM

## 2014-02-16 NOTE — Progress Notes (Signed)
02/16/2014 Susan Pittman   1941-08-17  [id number]  Primary Physicia [name], MD Primary Cardiologist: Dr Claiborne Billings  HPI:  73 yo WF with recent cardiac cath 12/12/13 showing non obstructive disease and 2 D echo done in 12/14 with normal EF and grade II diastolic dysfunction. She also has a hx of Rt RAS treated with right renal artery PTA and stent 11/08/13. This was noted to be patent at cath in Feb. 2015.          In Feb.2015 she underwent VATS procedure for lung nodules with LL lobectomy by Dr. Servando Snare and was diagnosed with INVASIVE ADENOCARCINOMA. The treatment plan at this point is surgery only.           More recently has developed SOB and edema.  Treatment has been complicated by worsening SCr. Dr Debara Pickett recently saw her in the office and increased her Lasix. A 24 hr urine had some protein but it did not appear she had nephrotic syndrome. Her SCr actually improved a little based on her SCr on 02/14/14 (2.34). She is to see Dr Lorrene Reid this week as well as Dr Julien Nordmann. She still has lower extremity edema in both legs.      Current Outpatient Prescriptions  Medication Sig Dispense Refill  . albuterol (PROVENTIL HFA;VENTOLIN HFA) 108 (90 BASE) MCG/ACT inhaler Inhale 1-2 puffs into the lungs every 6 (six) hours as needed for wheezing or shortness of breath.  18 g  1  . aspirin EC 81 MG tablet Take 81 mg by mouth daily.      . Calcium 600-200 MG-UNIT per tablet Take 1 tablet by mouth 2 (two) times daily. Takes at [employer]      . cetirizine (ZYRTEC) 10 MG tablet Take 10 mg by mouth at bedtime.       Marland Kitchen etodolac (LODINE) 400 MG tablet TAKE ONE TABLET BY MOUTH TWICE DAILY  120 tablet  0  . fluticasone (FLONASE) 50 MCG/ACT nasal spray Place 2 sprays into both nostrils daily as needed for allergies or rhinitis.      . furosemide (LASIX) 40 MG tablet Take 1 tablet (40 mg total) by mouth 2 (two) times daily.  60 tablet  6  . hydrALAZINE (APRESOLINE) 25 MG tablet Take 1 tablet (25 mg total)  by mouth 3 (three) times daily.  270 tablet  3  . lansoprazole (PREVACID) 15 MG capsule Take 15 mg by mouth daily.        Marland Kitchen levothyroxine (SYNTHROID, LEVOTHROID) 88 MCG tablet Take 88 mcg by mouth daily before breakfast.      . Multiple Vitamins-Minerals (MULTI FOR HER 50+ PO) Take 1 tablet by mouth daily.       . Nebivolol HCl (BYSTOLIC) 20 MG TABS Take 20 mg by mouth every morning.       No current facility-administered medications for this visit.    Allergies  Allergen Reactions  . Benazepril Hcl Anaphylaxis and Cough  . Loratadine Anaphylaxis    anaphylaxis  . Celecoxib     REACTION: aching  . Lisinopril     REACTION: cough  . Allegra [Fexofenadine Hcl]     Severe back pain  . Sulfa Antibiotics Hives    History   Social History  . Marital Status: Married    Spouse Name: N/A    Number of Children: N/A  . Years of Education: N/A   Occupational History  . Retired    Social History Main Topics  . Smoking  status: Never Smoker   . Smokeless tobacco: Never Used  . Alcohol Use: No  . Drug Use: No  . Sexual Activity: Yes   Other Topics Concern  . Not on file   Social History Narrative  . No narrative on file     Review of Systems: General: negative for chills, fever, night sweats or weight changes.  Cardiovascular: negative for chest pain, dyspnea on exertion, edema, orthopnea, palpitations, paroxysmal nocturnal dyspnea or shortness of breath Dermatological: negative for rash Respiratory: negative for cough or wheezing Urologic: negative for hematuria Abdominal: negative for nausea, vomiting, diarrhea, bright red blood per rectum, melena, or hematemesis Neurologic: negative for visual changes, syncope, or dizziness All other systems reviewed and are otherwise negative except as noted above.    Blood pressure 170/80, pulse 80, height 5\' 5"  (1.651 m), weight 246 lb 12.8 oz (111.948 kg).  General appearance: alert, cooperative and no distress Neck: no carotid  bruit and no JVD Lungs: clear to auscultation bilaterally Heart: regular rate and rhythm and extra systole noted Extremities: 2+ bilat LE edema, no calf pain   ASSESSMENT AND PLAN:   Edema of both legs This is her main complaint- it's better but still present  HYPERTENSION Recently placed on Hydralazine  Lung cancer, left lower lobe S/P lobectomy  Renal artery atherosclerosis, s/p LRA stent 11/08/13 Patent at cath 2/15  CKD (chronic kidney disease) stage 4, GFR 15-29 ml/min To see Dr Lorrene Reid this week  CAD (coronary artery disease), non obstructive by cardiac cath 12/12/13 .   PLAN  Same Rx. Keep follow up with Dr Lorrene Reid, Dr Julien Nordmann, and Dr Claiborne Billings.   Doreene Burke [facility] 02/16/2014 4:43 PM

## 2014-02-16 NOTE — Assessment & Plan Note (Signed)
This is her main complaint- it's better but still present

## 2014-02-16 NOTE — Patient Instructions (Signed)
Same treatment

## 2014-02-16 NOTE — Assessment & Plan Note (Signed)
Recently placed on Hydralazine

## 2014-02-16 NOTE — Assessment & Plan Note (Signed)
S/P lobectomy

## 2014-02-16 NOTE — Assessment & Plan Note (Signed)
Patent at cath 2/15

## 2014-02-16 NOTE — Assessment & Plan Note (Signed)
To see Dr Lorrene Reid this week

## 2014-02-17 ENCOUNTER — Encounter (HOSPITAL_COMMUNITY): Payer: Self-pay

## 2014-02-17 ENCOUNTER — Encounter: Payer: Self-pay | Admitting: Internal Medicine

## 2014-02-17 ENCOUNTER — Ambulatory Visit (INDEPENDENT_AMBULATORY_CARE_PROVIDER_SITE_OTHER): Payer: Self-pay | Admitting: Cardiothoracic Surgery

## 2014-02-17 ENCOUNTER — Telehealth: Payer: Self-pay | Admitting: General Practice

## 2014-02-17 ENCOUNTER — Ambulatory Visit (HOSPITAL_BASED_OUTPATIENT_CLINIC_OR_DEPARTMENT_OTHER): Payer: Medicare Other | Admitting: Internal Medicine

## 2014-02-17 ENCOUNTER — Telehealth: Payer: Self-pay | Admitting: Internal Medicine

## 2014-02-17 ENCOUNTER — Encounter: Payer: Self-pay | Admitting: Cardiothoracic Surgery

## 2014-02-17 ENCOUNTER — Ambulatory Visit (HOSPITAL_COMMUNITY)
Admission: RE | Admit: 2014-02-17 | Discharge: 2014-02-17 | Disposition: A | Discharge status: Home / Self Care | Payer: Medicare Other | Source: Ambulatory Visit | Attending: Cardiothoracic Surgery | Admitting: Cardiothoracic Surgery

## 2014-02-17 ENCOUNTER — Encounter: Payer: Self-pay | Admitting: *Deleted

## 2014-02-17 ENCOUNTER — Ambulatory Visit: Payer: Medicare Other | Attending: Internal Medicine | Admitting: Physical Therapy

## 2014-02-17 ENCOUNTER — Other Ambulatory Visit: Payer: Self-pay

## 2014-02-17 VITALS — BP 171/71 | HR 58 | Temp 98.0°F | Resp 19 | Ht 66.0 in | Wt 246.3 lb

## 2014-02-17 DIAGNOSIS — R0602 Shortness of breath: Secondary | ICD-10-CM | POA: Insufficient documentation

## 2014-02-17 DIAGNOSIS — R918 Other nonspecific abnormal finding of lung field: Secondary | ICD-10-CM | POA: Diagnosis not present

## 2014-02-17 DIAGNOSIS — Z85118 Personal history of other malignant neoplasm of bronchus and lung: Secondary | ICD-10-CM | POA: Insufficient documentation

## 2014-02-17 DIAGNOSIS — C3490 Malignant neoplasm of unspecified part of unspecified bronchus or lung: Secondary | ICD-10-CM

## 2014-02-17 DIAGNOSIS — J90 Pleural effusion, not elsewhere classified: Secondary | ICD-10-CM | POA: Insufficient documentation

## 2014-02-17 DIAGNOSIS — C3430 Malignant neoplasm of lower lobe, unspecified bronchus or lung: Secondary | ICD-10-CM

## 2014-02-17 DIAGNOSIS — N179 Acute kidney failure, unspecified: Secondary | ICD-10-CM | POA: Diagnosis not present

## 2014-02-17 DIAGNOSIS — Z902 Acquired absence of lung [part of]: Secondary | ICD-10-CM | POA: Insufficient documentation

## 2014-02-17 DIAGNOSIS — R609 Edema, unspecified: Secondary | ICD-10-CM | POA: Diagnosis not present

## 2014-02-17 DIAGNOSIS — IMO0001 Reserved for inherently not codable concepts without codable children: Secondary | ICD-10-CM | POA: Diagnosis not present

## 2014-02-17 NOTE — Progress Notes (Signed)
[place] [phone]   [phone] Multidisciplinary thoracic oncology clinic ([place])  CONSULT NOTE  REFERRING PHYSICIAN: Dr. Lanelle Bal  REASON FOR CONSULTATION:  73 years old white female recently diagnosed with lung cancer.  HPI Susan Pittman is a 73 y.o. female none smoker with past medical history significant for asthma, hypertension, hypothyroidism, osteoporosis, gastric ulcer as well as renal artery stenosis status post stent placement. The patient mentions that she had uncontrolled hypertension and she was found to have renal artery stenosis. He was undergoing routine preoperative chest x-ray on 11/02/2013 before renal artery stent placement. The chest x-ray showed a suspicious 2.2 CM left upper lobe pulmonary nodule. This was followed by CT scan of the chest on 11/08/2013 and it showed 2.1 x 1.8 x 1.4 CM ill-defined and homogenous mass in the superior segment of the left lower lobe worrisome for non-small cell lung cancer. There was no hilar or mediastinal lymphadenopathy. He patient was referred to Dr. Servando Snare and a PET scan was performed on 01/20/2015and it showed a dominant soft solid left lower lobe nodule did not demonstrate significant increased metabolic activity however morphologically adenocarcinoma is still a concern. On 12/16/2013 the patient underwent bronchoscopy, left VATS with wedge resection for biopsy of the left lower lobe lesion followed by completion of left lower lobectomy and lymph node dissection under the care of Dr. Servando Snare. The final pathology (Accession: [id number]) showed invasive adenocarcinoma measuring 1.7 CM. There is no evidence for visceral pleural involvement or lymphovascular invasion. GMS stain demonstrates abundant microorganisms present within the hyalinized nodules with morphology consistent with Histoplasmosis capsulatum. The tumor tissue were sent to [facility] and a molecular studies showed positive  METD1010H and MDM2 amplification but negative for EGFR mutation as well as ALK gene translocation. The patient is recovering well from her surgery. She has no complaints today except for swelling of the feet as well as shortness of breath increased with exertion especially after the surgery in addition to left-sided soreness. She denied having any significant weight loss or night sweats. She denied having any nausea or vomiting, no fever or chills. The patient has occasional headache but no blurry vision. Family history significant for sister with breast cancer at age 61 and she is currently 86. Her mother and father had heart disease. The patient is married and has 3 sons. She was accompanied today by her husband [name]. She is a retired Orthoptist. She is a never smoker and does not drink alcohol and no history of drug abuse.   HPI  Past Medical History  Diagnosis Date  . Asthma   . Hypertension   . GERD (gastroesophageal reflux disease)   . Allergy   . Hypothyroid   . Osteoporosis   . Gastric ulcer   . Hiatal hernia   . Hyperlipidemia     "don't take RX for it" (11/08/2013)  . Renal artery stenosis   . Lung mass     superior segment LLL/notes 11/08/2013  . Pneumonia     "I've had it ?3 times" (11/08/2013)  . Arthritis     "knees, hips, back, hands" (11/08/2013)  . DJD (degenerative joint disease)   . Basal cell carcinoma     "cut them off face & right shoulder" (11/08/2013)  . Lung nodule/ adenoca > LLobectomy 12/15/13  11/08/2013    PET 11/23/2013 1. Dominant sub solid left lower lobe nodule does not demonstrate significantly increased metabolic activity. However, morphologically, adenocarcinoma is still a concern.   -  Spirometry 11/24/13 wnl  - 11/24/2013 T surg eval rec> LLower lobectomy 12/15/2013 for adenoca   . CAD (coronary artery disease), non obstructive by cardiac cath 12/12/13 01/24/2014    Past Surgical History  Procedure Laterality Date  . Knee arthroscopy Bilateral   .  Anterior cervical decomp/discectomy fusion  2000    C5-C6  . Renal artery stent Right 11/08/2013    Archie Endo 11/08/2013  . Tonsillectomy and adenoidectomy  ?1946  . Cholecystectomy  1970's  . Vaginal hysterectomy  1970  . Dilation and curettage of uterus  1960's  . Skin cancer excision    . Coronary angiogram      Hx: of 2/ 2015  . Cardiac catheterization      Hx: of 2/ 2015  . Video bronchoscopy N/A 12/15/2013    Procedure: VIDEO BRONCHOSCOPY;  Surgeon: Grace Isaac, MD;  Location: [facility] OR;  Service: Thoracic;  Laterality: N/A;  . Video assisted thoracoscopy (vats)/wedge resection Left 12/15/2013    Procedure: VIDEO ASSISTED THORACOSCOPY (VATS)/WEDGE RESECTION;  Surgeon: Grace Isaac, MD;  Location: [facility];  Service: Thoracic;  Laterality: Left;  . Lobectomy Left 12/15/2013    Procedure: LOBECTOMY;  Surgeon: Grace Isaac, MD;  Location: [facility] OR;  Service: Thoracic;  Laterality: Left;    Family History  Problem Relation Age of Onset  . Breast cancer Sister   . Emphysema Father     smoked  . Allergies Sister   . Allergies Brother   . Heart disease Mother   . Heart disease Father     Social History History  Substance Use Topics  . Smoking status: Never Smoker   . Smokeless tobacco: Never Used  . Alcohol Use: No    Allergies  Allergen Reactions  . Benazepril Hcl Anaphylaxis and Cough  . Loratadine Anaphylaxis    anaphylaxis  . Celecoxib     REACTION: aching  . Lisinopril     REACTION: cough  . Allegra [Fexofenadine Hcl]     Severe back pain  . Sulfa Antibiotics Hives    Current Outpatient Prescriptions  Medication Sig Dispense Refill  . albuterol (PROVENTIL HFA;VENTOLIN HFA) 108 (90 BASE) MCG/ACT inhaler Inhale 1-2 puffs into the lungs every 6 (six) hours as needed for wheezing or shortness of breath.  18 g  1  . aspirin EC 81 MG tablet Take 81 mg by mouth daily.      . Calcium 600-200 MG-UNIT per tablet Take 1 tablet by mouth 2 (two) times daily. Takes at [employer]      . cetirizine (ZYRTEC) 10 MG tablet Take 10 mg by mouth at bedtime.       Marland Kitchen etodolac (LODINE) 400 MG tablet TAKE ONE TABLET BY MOUTH TWICE DAILY  120 tablet  0  . fluticasone (FLONASE) 50 MCG/ACT nasal spray Place 2 sprays into both nostrils daily as needed for allergies or rhinitis.      . furosemide (LASIX) 40 MG tablet Take 1 tablet (40 mg total) by mouth 2 (two) times daily.  60 tablet  6  . hydrALAZINE (APRESOLINE) 25 MG tablet Take 1 tablet (25 mg total) by mouth 3 (three) times daily.  270 tablet  3  . lansoprazole (PREVACID) 15 MG capsule Take 15 mg by mouth daily.        Marland Kitchen levothyroxine (SYNTHROID, LEVOTHROID) 88 MCG tablet Take 88 mcg by mouth daily before breakfast.      . Multiple Vitamins-Minerals (MULTI FOR HER 50+ PO) Take 1  tablet by mouth daily.       . Nebivolol HCl (BYSTOLIC) 20 MG TABS Take 20 mg by mouth every morning.       No current facility-administered medications for this visit.    Review of Systems  Constitutional: negative Eyes: negative Ears, nose, mouth, throat, and face: negative Respiratory: positive for dyspnea on exertion and pleurisy/chest pain Cardiovascular: negative Gastrointestinal: negative Genitourinary:negative Integument/breast: negative Hematologic/lymphatic: negative Musculoskeletal:negative Neurological: positive for headaches Behavioral/Psych: negative Endocrine: negative Allergic/Immunologic: negative  Physical Exam  NKN:LZJQB, healthy, no distress, well nourished and well developed SKIN: skin color, texture, turgor are normal, no rashes or significant lesions HEAD: Normocephalic, No masses, lesions, tenderness or abnormalities EYES: normal, PERRLA EARS: External ears normal, Canals clear OROPHARYNX:no exudate, no erythema and lips, buccal mucosa, and tongue normal  NECK: supple, no adenopathy, no JVD LYMPH:  no palpable lymphadenopathy, no hepatosplenomegaly BREAST:not examined LUNGS: clear to auscultation ,  and palpation HEART: regular rate & rhythm, no murmurs and no gallops ABDOMEN:abdomen soft, non-tender, normal bowel sounds and no masses or organomegaly BACK: Back symmetric, no curvature., No CVA tenderness EXTREMITIES:no joint deformities, effusion, or inflammation, no edema, no skin discoloration, no clubbing  NEURO: alert & oriented x 3 with fluent speech, no focal motor/sensory deficits  PERFORMANCE STATUS: ECOG 1  LABORATORY DATA: Lab Results  Component Value Date   WBC 11.2* 01/18/2014   HGB 10.1* 01/18/2014   HCT 30.7* 01/18/2014   MCV 83.1 01/18/2014   PLT 351.0 01/18/2014      Chemistry      Component Value Date/Time   NA 140 02/14/2014 1030   K 4.6 02/14/2014 1030   CL 105 02/14/2014 1030   CO2 24 02/14/2014 1030   BUN 42* 02/14/2014 1030   CREATININE 2.34* 02/14/2014 1030   CREATININE 2.6* 02/03/2014 0839      Component Value Date/Time   CALCIUM 8.4 02/14/2014 1030   ALKPHOS 72 02/14/2014 1030   AST 13 02/14/2014 1030   ALT 8 02/14/2014 1030   BILITOT 0.8 02/14/2014 1030       RADIOGRAPHIC STUDIES: Dg Chest 2 View  02/17/2014   CLINICAL DATA:  Or this of breath with history of lung malignancy  EXAM: CHEST  2 VIEW  COMPARISON:  DG CHEST 2 VIEW dated 01/27/2014  FINDINGS: The right lung is adequately inflated. There is minimal stable blunting of the lateral and posterior costophrenic angle on the right. On the left there is persistent increased density inferiorly that obscures the hemidiaphragm and costophrenic angles. Allowing for differences in positioning this has progressed minimally. The left heart border is obscured. The right heart border appears normal. The pulmonary vascularity is not engorged.  IMPRESSION: The findings suggest progressive atelectasis of the left lower lobe which may be due to progressive malignancy. Small amounts of pleural fluid are present on the right but are stable.   Electronically Signed   By: David  Martinique   On: 02/17/2014 13:39   Dg Chest 2  View  01/27/2014   CLINICAL DATA:  Mild dyspnea, left pleural effusion.  EXAM: CHEST  2 VIEW  COMPARISON:  January 18, 2014.  FINDINGS: Stable cardiomediastinal silhouette. Stable mild right pleural effusion. Left basilar opacity is unchanged compared to prior exam, concerning for pneumonia or atelectasis with associated pleural effusion. Status post surgical fusion of lower cervical spine. No pneumothorax is noted.  IMPRESSION: Stable mild right pleural effusion. Stable left basilar opacity is noted concerning for pneumonia or atelectasis with associated pleural effusion.  Electronically Signed   By: Sabino Dick M.D.   On: 01/27/2014 13:49    ASSESSMENT: This is a very pleasant 73 years old white female recently diagnosed with stage IA (T1a, N0, M0) non-small cell lung cancer, adenocarcinoma diagnosed in January of 2015, status post left lower lobectomy with lymph node dissection.   PLAN:  I had a lengthy discussion with the patient and her husband today about her current disease status, prognosis and treatment options. I explained to the patient that the 5 year survival for patient with stage IA is around 80%. I also explained to the patient that there is no survival benefit for adjuvant chemotherapy and patient with stage IA non-small cell lung cancer. I recommended for the patient to continue on observation for now with repeat CT scan of the chest in 6 months. The patient has occasional headache and she did not have any imaging studies of her brain but the risk for metastatic brain lesions in patients with stage IA is usually low. I offered the patient to do a CT scan of the head but she would like to hold on this for now and she will call me if your headaches persist or she has any other neurological symptoms. I would see the patient back for followup visit in 6 months after repeating her scan of the chest. She was advised to call immediately if she has any concerning symptoms in the  interval.  The patient voices understanding of current disease status and treatment options and is in agreement with the current care plan.  All questions were answered. The patient knows to call the clinic with any problems, questions or concerns. We can certainly see the patient much sooner if necessary.  Thank you so much for allowing me to participate in the care of [place]. I will continue to follow up the patient with you and assist in her care.  I spent 35 minutes counseling the patient face to face. The total time spent in the appointment was 55 minutes.  Disclaimer: This note was dictated with voice recognition software. Similar sounding words can inadvertently be transcribed and may not be corrected upon review.   Curt Bears 02/17/2014, 2:25 PM

## 2014-02-17 NOTE — Progress Notes (Signed)
[address] Village[address]       [place] [address]             [phone]                      Charo L Kidd [place] Medical Record #[id number] Date of Birth: 04-13-1941  Referring IW:OEHOZY, Aundra Millet, MD Primary Cardiology:Dr [facility] Primary Care:Katherine [name], MD  Chief Complaint:   PostOp Follow Up Visit 12/15/2013  OPERATIVE REPORT  PREOPERATIVE DIAGNOSIS: Left lower lobe lung mass.  POSTOPERATIVE DIAGNOSIS: Left lower lobe lung mass. Adenocarcinoma by  frozen section.  PROCEDURE PERFORMED: Bronchoscopy, left video-assisted thoracoscopy,  with wedge resection for biopsy of left lower lobe lesion, completion  left lower lobectomy, lymph node dissection, placement of On-Q device.  SURGEON: Lanelle Bal, MD   Diagnosis: GMS stain demonstrates abundant microorganisms present within the hyalinized nodules with morphology consistent with Histoplasmosis capsulatum. 1. Lung, wedge biopsy/resection, Left lower lobe - INVASIVE ADENOCARCINOMA SEE COMMENT. - TUMOR INVOLVES SURGICAL MARGIN. - NO LYMPHOVASCULAR INVASION IDENTIFIED. - SEE TUMOR SYNOPTIC TEMPLATE BELOW. 2. Lymph node, biopsy, 8 node - ONE LYMPH NODE, NEGATIVE FOR TUMOR (0/1), SEE COMMENT. 3. Lymph node, biopsy, 11 L - ONE LYMPH NODE, NEGATIVE FOR TUMOR (0/1). 4. Lung, resection (segmental or lobe), Left lower - BENIGN LUNG, SEE COMMENT. - NEGATIVE FOR ATYPIA OR MALIGNANCY. - BRONCHOVASCULAR MARGIN, NEGATIVE FOR ATYPIA OR MALIGNANCY. 5. Lymph node, biopsy, 10 node - ONE LYMPH NODE, NEGATIVE FOR TUMOR (0/1). 6. Lymph node, biopsy, 12 node - ONE LYMPH NODE, NEGATIVE FOR TUMOR (0/1). Microscopic Comment 1. LUNG 1 of 3 FINAL for YADIRA, HADA ([id number]) Microscopic Comment(continued) Specimen, including laterality: Left lower lobe (parts 1 and 4). Procedure: Wedge resection and lobectomy Specimen integrity (intact/disrupted): Intact Tumor site: Subpleural Maximum tumor size (cm): 1.7  cm Histologic type: Adenocarcinoma (acinar and lepidic subtypes) Grade: I/well differentiated Margins: Present at margins, see comment. Distance to closest margin (cm): Present at margin Visceral pleura invasion: Absent Tumor extension: Tumor is confined to subpleural parenchyma Treatment effect (if treated with neoadjuvant therapy): None Lymph -Vascular invasion: Absent Lymph nodes: Number examined - 4; Number N1 nodes positive - 0; Number N2 nodes positive - 0 TNM code: pT1a, pN0, pMX Ancillary studies: Can be performed upon request Non-neoplastic lung: See part 4. Comments: The final surgical margin is part 4. 2. Sections of lymph node demonstrate multiple foci of extensively hyalinized and calcified intranodal nodules. AFB, PAS and GMS stains are pending and will be reported in an addendum. 4. There is no tumor grossly identified. Representative sections of the lung demonstrate non-neoplastic findings to include minimal chronic interstitial inflammation with rare epithelioid granuloma, anthracotic pigment deposition, minimal peribronchiolar chronic inflammation, Langerhans cell histiocytic nodules, and an incidental 3 mm subpleural minute pulmonary meningothelial-like nodule (chemodectoma). The chemodectoma is an incidental, benign finding of no prognostic significance. There are no features of epithelial dysplasia or malignancy present. (CR:kh 12-16-13) Mali RUND DO  History of Present Illness:     Patient returns to the office today after left lower lobectomy for stage I adenocarcinoma the lung. She continues to have  mild pedal edema. She had had some cough and low-grade fever and was started on by mouth Augmentin by her primary care doctor. She has had recent deterioration of renal function, sen by renal today question of acute glomer nephritis, to have renal bx.   History  Smoking status  . Never Smoker  Smokeless tobacco  . Never Used       Allergies  Allergen Reactions   . Benazepril Hcl Anaphylaxis and Cough  . Loratadine Anaphylaxis    anaphylaxis  . Celecoxib     REACTION: aching  . Lisinopril     REACTION: cough  . Allegra [Fexofenadine Hcl]     Severe back pain  . Sulfa Antibiotics Hives    Current Outpatient Prescriptions  Medication Sig Dispense Refill  . albuterol (PROVENTIL HFA;VENTOLIN HFA) 108 (90 BASE) MCG/ACT inhaler Inhale 1-2 puffs into the lungs every 6 (six) hours as needed for wheezing or shortness of breath.  18 g  1  . Calcium 600-200 MG-UNIT per tablet Take 1 tablet by mouth 2 (two) times daily. Takes at [employer]      . cetirizine (ZYRTEC) 10 MG tablet Take 10 mg by mouth at bedtime.       . fluticasone (FLONASE) 50 MCG/ACT nasal spray Place 2 sprays into both nostrils daily as needed for allergies or rhinitis.      . furosemide (LASIX) 40 MG tablet Take 1 tablet (40 mg total) by mouth 2 (two) times daily.  60 tablet  6  . hydrALAZINE (APRESOLINE) 25 MG tablet Take 1 tablet (25 mg total) by mouth 3 (three) times daily.  270 tablet  3  . lansoprazole (PREVACID) 15 MG capsule Take 15 mg by mouth daily.        Marland Kitchen levothyroxine (SYNTHROID, LEVOTHROID) 88 MCG tablet Take 88 mcg by mouth daily before breakfast.      . Multiple Vitamins-Minerals (MULTI FOR HER 50+ PO) Take 1 tablet by mouth daily.       . Nebivolol HCl (BYSTOLIC) 20 MG TABS Take 20 mg by mouth every morning.      Marland Kitchen aspirin EC 81 MG tablet Take 81 mg by mouth daily.      Marland Kitchen etodolac (LODINE) 400 MG tablet TAKE ONE TABLET BY MOUTH TWICE DAILY  120 tablet  0   No current facility-administered medications for this visit.       Physical Exam: BP 171/71  Pulse 58  Temp(Src) 98 F (36.7 C)  Resp 19  Ht 5\' 6"  (1.676 m)  Wt 246 lb 4.8 oz (111.721 kg)  BMI 39.77 kg/m2  SpO2 98%  General appearance: alert and cooperative Neurologic: intact Heart: regular rate and rhythm, S1, S2 normal, no murmur, click, rub or gallop Lungs: diminished breath sounds  LLL Abdomen: soft, non-tender; bowel sounds normal; no masses,  no organomegaly Extremities: extremities normal, atraumatic, no cyanosis , Homans sign is negative, no sign of DVT mild bilateral pedal edema Wound: The larger incision and anterior chest tube site are well-healed. Chest tube site is now completely healed the other incisions are totally healed without evidence of infection  Diagnostic Studies & Laboratory data:         Recent Radiology Findings: Dg Chest 2 View  02/17/2014   CLINICAL DATA:  Or this of breath with history of lung malignancy  EXAM: CHEST  2 VIEW  COMPARISON:  DG CHEST 2 VIEW dated 01/27/2014  FINDINGS: The right lung is adequately inflated. There is minimal stable blunting of the lateral and posterior costophrenic angle on the right. On the left there is persistent increased density inferiorly that obscures the hemidiaphragm and costophrenic angles. Allowing for differences in positioning this has progressed minimally. The left heart border is obscured. The right heart border appears normal. The pulmonary vascularity is not engorged.  IMPRESSION: The findings suggest progressive atelectasis of the left lower lobe which may be due to progressive malignancy. Small amounts of pleural fluid are present on the right but are stable.   Electronically Signed   By: David  Martinique   On: 02/17/2014 13:39   Ct Chest Wo Contrast  02/17/2014   CLINICAL DATA:  Lung infiltrates. History of lung carcinoma in January 2015 with a left lower lobectomy. Short of breath.  EXAM: CT CHEST WITHOUT CONTRAST  TECHNIQUE: Multidetector CT imaging of the chest was performed following the standard protocol without IV contrast.  COMPARISON:  Current chest radiograph.  Chest CT, 11/05/2013  FINDINGS: There are small right and minimal left pleural effusions. There is consolidation in coarse reticular opacity in the remaining left upper lobe in the posterior lung base. This may all be atelectasis. Pneumonia  should be considered likely in the proper clinical setting.  There are 2 small stable nodular densities in the right lung, most likely benign granulomas, scarring or a combination. Mild stable scarring is noted in the anterior medial right lung base. This subsegmental atelectasis at the posterior right lung base. No pulmonary edema. No discrete left lung nodules are seen. There is volume loss on the left from the left lower lobectomy.  There is new mild mediastinal adenopathy. A 1 cm short axis node lies in the precarinal region where it had measures 6.5 mm. There are several prevascular nodes that are prominent. There is a 7 mm node just superior to the main pulmonary artery. This measured 3 mm previously. There is a 9.4 mm right supraclavicular node which previously measured 1 mm smaller. There is and 6 mm left supraclavicular another which measured 4 mm previously.  Heart is normal in size and configuration.  Limited evaluation of the upper abdomen shows node with a or adrenal masses.  The bony thorax is demineralized. There are degenerative changes along the visualized spine. No osteoblastic or osteolytic lesions.  ri IMPRESSION: 1. There is focal opacity with associated linear and coarse reticular opacity in the posterior lower aspect of the remaining left lung. Although this could be atelectasis, pneumonia is suspected. There is a minimal associated left pleural effusion. 2. There are prominent mediastinal lymph nodes which have increased from the prior study. This could reflect metastatic disease. The nodes may be reactive to the lower lung zone pneumonia. There are prominent supraclavicular nodes that have increased in size as well. No other evidence suggesting metastatic disease. 3. Smallght effusion.  No evidence of a right lung infiltrate. 4. To small right lung nodules are without significant change in most likely benign.   Electronically Signed   By: Lajean Manes M.D.   On: 02/17/2014 17:11    Dg  Chest 2 View  01/27/2014   CLINICAL DATA:  Mild dyspnea, left pleural effusion.  EXAM: CHEST  2 VIEW  COMPARISON:  January 18, 2014.  FINDINGS: Stable cardiomediastinal silhouette. Stable mild right pleural effusion. Left basilar opacity is unchanged compared to prior exam, concerning for pneumonia or atelectasis with associated pleural effusion. Status post surgical fusion of lower cervical spine. No pneumothorax is noted.  IMPRESSION: Stable mild right pleural effusion. Stable left basilar opacity is noted concerning for pneumonia or atelectasis with associated pleural effusion.   Electronically Signed   By: Sabino Dick M.D.   On: 01/27/2014 13:49   Recent Labs: Lab Results  Component Value Date   WBC 11.2* 01/18/2014   HGB 10.1* 01/18/2014   HCT 30.7* 01/18/2014  PLT 351.0 01/18/2014   GLUCOSE 79 02/14/2014   CHOL 180 10/13/2013   TRIG 105 10/13/2013   HDL 52 10/13/2013   LDLDIRECT 126.3 09/10/2012   LDLCALC 107* 10/13/2013   ALT 8 02/14/2014   AST 13 02/14/2014   NA 140 02/14/2014   K 4.6 02/14/2014   CL 105 02/14/2014   CREATININE 2.34* 02/14/2014   BUN 42* 02/14/2014   CO2 24 02/14/2014   TSH 3.158 10/13/2013   INR 1.06 12/13/2013   BNP have increased since  3/17 371  To 5088 to 3460 CR has increased  To 2.46 now ,  1.2 on 2/13  Assessment / Plan:    Decreasing renal function , with increased CR and BNP, being followed by renal service for renal bx  CT scan - no contrast , with left lower lung atelectasis, doubt progression of cancer no symptoms now of pulmonary infection Plan to see her back again in 3 weeks with followup chest x-ray    Grace Isaac 01/29/2023

## 2014-02-17 NOTE — Telephone Encounter (Signed)
Dr. Lorrene Reid called in regards to pt. States she feels Ms. Langone has Acute Glomular Nephritis. She is having a percutaneous renal biopsy within the week. Active urine sedimentation, no current labs back.    Would like to discuss with you please page at [phone]

## 2014-02-17 NOTE — Telephone Encounter (Signed)
Gave pt appt for lab and MD for october 2015

## 2014-02-17 NOTE — Progress Notes (Signed)
   Thoracic Treatment Summary Faythe Dingwall Date:02/17/2014 DOB:04-06-1941 Your Medical Team Medical Oncologist: Dr. Julien Nordmann  Surgeon: Dr. Servando Snare Type and Stage of Lung Cancer Non-Small Cell Carcinoma: Adenocarcinoma Clinical Stage: I Pathological Stage: pT1,pN0,MX  Clinical stage is based on radiology exams.  Pathological stage will be determined after surgery.  Staging is based on the size of the tumor, involvement of lymph nodes or not, and whether or not the cancer center has spread. Recommendations Recommendations: Follow up with Dr. Julien Nordmann in 6 months   These recommendations are based on information available as of today's consult.  This is subject to change depending further testing or exams. Next Steps Next Step: 1. Medical Oncology will set up appointment to see Dr. Julien Nordmann in 6 months   Barriers to Care What do you perceive as a potential barrier that may prevent you from receiving your treatment plan? Nothing perceived at this time Resources provided NCI booklet on lung cancer Support services at [facility] Questions Norton Blizzard, RN BSN Thoracic Oncology Nurse Navigator at [place] is a nurse navigator that is available to assist you through your cancer journey.  She can answer your questions and/or provide resources regarding your treatment plan, emotional support, or financial concerns.

## 2014-02-17 NOTE — Telephone Encounter (Signed)
Noted  

## 2014-02-17 NOTE — Telephone Encounter (Signed)
Called and spoke w/ Dr Lorrene Reid.  Will follow along.

## 2014-02-18 ENCOUNTER — Other Ambulatory Visit (HOSPITAL_COMMUNITY): Payer: Self-pay | Admitting: Nephrology

## 2014-02-18 DIAGNOSIS — N179 Acute kidney failure, unspecified: Secondary | ICD-10-CM

## 2014-02-18 NOTE — Progress Notes (Signed)
Clinical Network engineer met with Patient and Futures trader at [facility] appointment to offer support and assess for psychological needs.  Medical Oncologist reviewed Patient's diagnosis and recommendation for follow up appointment every 6 months.  The Patient reported no concerns or questions after meeting with MD.  Patient had husband with her as part of her support system.  CSWI discussed  Clinical Social Work role and [employer] survivorship support program services.  CSWI encouraged Patient to call with questions and concerns.  Archit Leger S. Teachey Work Intern [employer] [phone]

## 2014-02-21 ENCOUNTER — Other Ambulatory Visit: Payer: Self-pay | Admitting: Radiology

## 2014-02-23 ENCOUNTER — Encounter (HOSPITAL_COMMUNITY): Payer: Self-pay | Admitting: Pharmacy Technician

## 2014-02-24 NOTE — Telephone Encounter (Signed)
Closed encounter °

## 2014-02-25 ENCOUNTER — Observation Stay (HOSPITAL_COMMUNITY)
Admission: RE | Admit: 2014-02-25 | Discharge: 2014-02-26 | Disposition: A | Discharge status: Home / Self Care | Payer: Medicare Other | Source: Ambulatory Visit | Attending: Diagnostic Radiology | Admitting: Diagnostic Radiology

## 2014-02-25 ENCOUNTER — Encounter (HOSPITAL_COMMUNITY): Payer: Self-pay

## 2014-02-25 VITALS — BP 184/61 | HR 54 | Temp 98.3°F | Resp 18 | Ht 65.0 in | Wt 241.3 lb

## 2014-02-25 DIAGNOSIS — E039 Hypothyroidism, unspecified: Secondary | ICD-10-CM | POA: Diagnosis not present

## 2014-02-25 DIAGNOSIS — K449 Diaphragmatic hernia without obstruction or gangrene: Secondary | ICD-10-CM | POA: Diagnosis not present

## 2014-02-25 DIAGNOSIS — R319 Hematuria, unspecified: Secondary | ICD-10-CM

## 2014-02-25 DIAGNOSIS — Z85828 Personal history of other malignant neoplasm of skin: Secondary | ICD-10-CM | POA: Insufficient documentation

## 2014-02-25 DIAGNOSIS — R809 Proteinuria, unspecified: Secondary | ICD-10-CM | POA: Diagnosis not present

## 2014-02-25 DIAGNOSIS — I10 Essential (primary) hypertension: Secondary | ICD-10-CM | POA: Diagnosis not present

## 2014-02-25 DIAGNOSIS — J45909 Unspecified asthma, uncomplicated: Secondary | ICD-10-CM | POA: Diagnosis not present

## 2014-02-25 DIAGNOSIS — Z981 Arthrodesis status: Secondary | ICD-10-CM | POA: Diagnosis not present

## 2014-02-25 DIAGNOSIS — Z902 Acquired absence of lung [part of]: Secondary | ICD-10-CM | POA: Insufficient documentation

## 2014-02-25 DIAGNOSIS — R310 Gross hematuria: Secondary | ICD-10-CM | POA: Insufficient documentation

## 2014-02-25 DIAGNOSIS — E785 Hyperlipidemia, unspecified: Secondary | ICD-10-CM | POA: Insufficient documentation

## 2014-02-25 DIAGNOSIS — M810 Age-related osteoporosis without current pathological fracture: Secondary | ICD-10-CM | POA: Diagnosis not present

## 2014-02-25 DIAGNOSIS — N059 Unspecified nephritic syndrome with unspecified morphologic changes: Secondary | ICD-10-CM | POA: Diagnosis not present

## 2014-02-25 DIAGNOSIS — N179 Acute kidney failure, unspecified: Secondary | ICD-10-CM

## 2014-02-25 DIAGNOSIS — K219 Gastro-esophageal reflux disease without esophagitis: Secondary | ICD-10-CM | POA: Insufficient documentation

## 2014-02-25 DIAGNOSIS — I2510 Atherosclerotic heart disease of native coronary artery without angina pectoris: Secondary | ICD-10-CM | POA: Diagnosis not present

## 2014-02-25 LAB — CBC
HCT: 30.5 % — ABNORMAL LOW (ref 36.0–46.0)
HEMATOCRIT: 35.9 % — AB (ref 36.0–46.0)
Hemoglobin: 11.6 g/dL — ABNORMAL LOW (ref 12.0–15.0)
Hemoglobin: 9.6 g/dL — ABNORMAL LOW (ref 12.0–15.0)
MCH: 28 pg (ref 26.0–34.0)
MCH: 27.4 pg (ref 26.0–34.0)
MCHC: 32.3 g/dL (ref 30.0–36.0)
MCHC: 31.5 g/dL (ref 30.0–36.0)
MCV: 86.5 fL (ref 78.0–100.0)
MCV: 87.1 fL (ref 78.0–100.0)
PLATELETS: 269 10*3/uL (ref 150–400)
Platelets: 294 10*3/uL (ref 150–400)
RBC: 4.15 MIL/uL (ref 3.87–5.11)
RBC: 3.5 MIL/uL — ABNORMAL LOW (ref 3.87–5.11)
RDW: 16.5 % — ABNORMAL HIGH (ref 11.5–15.5)
RDW: 16.5 % — ABNORMAL HIGH (ref 11.5–15.5)
WBC: 7.9 10*3/uL (ref 4.0–10.5)
WBC: 8 10*3/uL (ref 4.0–10.5)

## 2014-02-25 LAB — APTT: aPTT: 30 seconds (ref 24–37)

## 2014-02-25 LAB — PROTIME-INR
INR: 1.01 (ref 0.00–1.49)
Prothrombin Time: 13.1 seconds (ref 11.6–15.2)

## 2014-02-25 MED ORDER — NEBIVOLOL HCL 10 MG PO TABS
20.0000 mg | ORAL_TABLET | Freq: Every day | ORAL | Status: DC
  Administered 2014-02-25: 20 mg via ORAL
  Filled 2014-02-25: qty 2

## 2014-02-25 MED ORDER — FENTANYL CITRATE 0.05 MG/ML IJ SOLN
INTRAMUSCULAR | Status: AC | PRN
  Administered 2014-02-25: 25 ug via INTRAVENOUS
  Administered 2014-02-25: 50 ug via INTRAVENOUS

## 2014-02-25 MED ORDER — FUROSEMIDE 10 MG/ML IJ SOLN: 80.0000 mg | Freq: Once | INTRAMUSCULAR | Status: DC

## 2014-02-25 MED ORDER — HYDRALAZINE HCL 25 MG PO TABS
25.0000 mg | ORAL_TABLET | Freq: Three times a day (TID) | ORAL | Status: DC
  Administered 2014-02-25 – 2014-02-26 (×2): 25 mg via ORAL
  Filled 2014-02-25 (×4): qty 1

## 2014-02-25 MED ORDER — CLONIDINE HCL 0.1 MG PO TABS
0.1000 mg | ORAL_TABLET | ORAL | Status: DC | PRN
  Administered 2014-02-26: 0.1 mg via ORAL
  Filled 2014-02-25: qty 1

## 2014-02-25 MED ORDER — ACETAMINOPHEN 500 MG PO TABS: 1000.0000 mg | ORAL_TABLET | Freq: Three times a day (TID) | ORAL | Status: DC | PRN

## 2014-02-25 MED ORDER — FUROSEMIDE 80 MG PO TABS
80.0000 mg | ORAL_TABLET | Freq: Two times a day (BID) | ORAL | Status: DC
  Administered 2014-02-26: 80 mg via ORAL
  Filled 2014-02-25 (×4): qty 1

## 2014-02-25 MED ORDER — ALBUTEROL SULFATE HFA 108 (90 BASE) MCG/ACT IN AERS: 1.0000 | INHALATION_SPRAY | Freq: Four times a day (QID) | RESPIRATORY_TRACT | Status: DC | PRN

## 2014-02-25 MED ORDER — HYDRALAZINE HCL 25 MG PO TABS
25.0000 mg | ORAL_TABLET | Freq: Once | ORAL | Status: AC
  Administered 2014-02-25: 25 mg via ORAL
  Filled 2014-02-25: qty 1

## 2014-02-25 MED ORDER — MIDAZOLAM HCL 2 MG/2ML IJ SOLN
INTRAMUSCULAR | Status: AC | PRN
  Administered 2014-02-25: 2 mg via INTRAVENOUS

## 2014-02-25 MED ORDER — FUROSEMIDE 80 MG PO TABS
80.0000 mg | ORAL_TABLET | Freq: Once | ORAL | Status: AC
  Administered 2014-02-25: 80 mg via ORAL
  Filled 2014-02-25 (×3): qty 1

## 2014-02-25 MED ORDER — FENTANYL CITRATE 0.05 MG/ML IJ SOLN
INTRAMUSCULAR | Status: AC
  Filled 2014-02-25: qty 4

## 2014-02-25 MED ORDER — NEBIVOLOL HCL 10 MG PO TABS
20.0000 mg | ORAL_TABLET | Freq: Every morning | ORAL | Status: DC
  Filled 2014-02-25: qty 2

## 2014-02-25 MED ORDER — LEVOTHYROXINE SODIUM 88 MCG PO TABS
88.0000 ug | ORAL_TABLET | Freq: Every day | ORAL | Status: DC
  Administered 2014-02-26: 88 ug via ORAL
  Filled 2014-02-25 (×3): qty 1

## 2014-02-25 MED ORDER — MIDAZOLAM HCL 2 MG/2ML IJ SOLN
INTRAMUSCULAR | Status: AC
  Filled 2014-02-25: qty 4

## 2014-02-25 MED ORDER — HYDROCODONE-ACETAMINOPHEN 5-325 MG PO TABS
1.0000 | ORAL_TABLET | ORAL | Status: DC | PRN
  Administered 2014-02-25: 2 via ORAL
  Filled 2014-02-25: qty 2

## 2014-02-25 MED ORDER — SODIUM CHLORIDE 0.9 % IV SOLN
Freq: Once | INTRAVENOUS | Status: AC
  Administered 2014-02-25: 10:00:00 via INTRAVENOUS

## 2014-02-25 MED ORDER — PANTOPRAZOLE SODIUM 20 MG PO TBEC
20.0000 mg | DELAYED_RELEASE_TABLET | Freq: Every day | ORAL | Status: DC
  Administered 2014-02-25 – 2014-02-26 (×2): 20 mg via ORAL
  Filled 2014-02-25 (×2): qty 1

## 2014-02-25 NOTE — Progress Notes (Signed)
Transferred to 6-E-17 via w/c

## 2014-02-25 NOTE — Procedures (Signed)
US guided right renal biopsy.  Two core biopsies obtained.  No immediate complication.

## 2014-02-25 NOTE — Progress Notes (Signed)
Subjective: Patient is s/p renal biopsy, she does have some HTN and gross hematuria post procedure and has voided (3) times, urine less bloody each void. She did not have gross hematuria pre-op. She denies any pain at site, lightheadedness/dizziness, nausea or vomiting. She has been given her home BP medications and was instructed not to take them pre-procedure. BP improving and was stable pre-operatively.   Objective: Physical Exam: BP 173/71  Pulse 64  Temp(Src) 97.9 F (36.6 C) (Oral)  Resp 20  Ht 5\' 5"  (1.651 m)  Wt 240 lb (108.863 kg)  BMI 39.94 kg/m2  SpO2 99%  General: A&Ox3, NAD, sitting in bed Abd: Soft, NT, ND, (+) BS, no right flank site tenderness, bleeding or hematoma   Labs: CBC  Recent Labs  02/25/14 0816  WBC 7.9  HGB 11.6*  HCT 35.9*  PLT 294   BMET No results found for this basename: NA, K, CL, CO2, GLUCOSE, BUN, CREATININE, CALCIUM,  in the last 72 hours LFT No results found for this basename: PROT, ALBUMIN, AST, ALT, ALKPHOS, BILITOT, BILIDIR, IBILI, LIPASE,  in the last 72 hours PT/INR  Recent Labs  02/25/14 0816  LABPROT 13.1  INR 1.01     Studies/Results: US Biopsy  02/25/2014   CLINICAL DATA:  73 year old with proteinuria and microscopic hematuria.  EXAM: ULTRASOUND GUIDED RIGHT KIDNEY BIOPSY  MEDICATIONS: 2.0 mg IV Versed; 75 mcg IV Fentanyl. A radiology nurse monitored the patient for moderate sedation.  Total Moderate Sedation Time: 15 min  PROCEDURE: The procedure, risks, benefits, and alternatives were explained to the patient. Questions regarding the procedure were encouraged and answered. The patient understands and consents to the procedure.  Both kidneys were evaluated with ultrasound. Patient was in a prone position. The right kidney was selected for biopsy. The right flank was prepped with Betadine and a sterile field was created. Using sterile technique, the skin was anesthetized 1% lidocaine. A 16 gauge core biopsy needle was directed  into the lower pole cortex with ultrasound guidance. A total of 2 core biopsies were obtained and samples were placed in saline. Bandage was placed over the puncture site.  COMPLICATIONS: None.  FINDINGS: Biopsy needle was directed into the lower pole on both occasions. No immediate bleeding or hematoma formation.  IMPRESSION: Successful ultrasound-guided core biopsies of the right kidney lower pole.  The patient was placed on both bed rest following the procedure. A few hours after the procedure, the patient had painless hematuria. As result, the patient will be kept for 23 hr observation.   Electronically Signed   By: Markus Daft M.D.   On: 02/25/2014 16:04    Assessment/Plan: Proteinuria  S/p image guided renal biopsy with moderate sedation with (2) core biopsy samples  Post operative hematuria, improving, no pain or hypotension.  Case d/w nephrology, IR will admit and observe patient overnight, plan to discharge in am if stable. Orders placed for BP medications, repeat labs, diet, and urine collection. Dr. Anselm Pancoast has seen and examined the patient today and agrees with the above plan.   Hedy Jacob PA-C 02/25/2014 4:12 PM  Agree with PA note.

## 2014-02-25 NOTE — Progress Notes (Signed)
KOREEN,PA NOTIFIED OF B/P AND ORDERS NOTED

## 2014-02-25 NOTE — H&P (Signed)
Chief Complaint: "I am here for a kidney biopsy." Referring Physician: Dr. Lorrene Reid HPI: Susan Pittman is an 73 y.o. female who is here today for a scheduled random renal biopsy. She was seen by Dr. Lorrene Reid 02/17/14 with c/o new onset of cough, LE swelling and shortness of breath since 01/17/14. UA revealed 4+ protein, 3+ occult blood, 30-50 RBC's and 1-2 RBC casts. Renal function elevated. She denies any rash or arthralgias. She denies any gross hematuria or foam looking urine. She does admit to slight darker color of urine. IR received request for image guided renal biopsy with moderate sedation. She denies any chest pain, or change in chronic shortness of breath or palpitations. She denies any active signs of bleeding or excessive bruising. She denies any recent fever or chills. The patient denies any history of sleep apnea or chronic oxygen use. He has previously tolerated sedation without complications during a colonoscopy.   Past Medical History:  Past Medical History  Diagnosis Date  . Asthma   . Hypertension   . GERD (gastroesophageal reflux disease)   . Allergy   . Hypothyroid   . Osteoporosis   . Gastric ulcer   . Hiatal hernia   . Hyperlipidemia     "don't take RX for it" (11/08/2013)  . Renal artery stenosis   . Lung mass     superior segment LLL/notes 11/08/2013  . Pneumonia     "I've had it ?3 times" (11/08/2013)  . Arthritis     "knees, hips, back, hands" (11/08/2013)  . DJD (degenerative joint disease)   . Lung nodule/ adenoca > LLobectomy 12/15/13  11/08/2013    PET 11/23/2013 1. Dominant sub solid left lower lobe nodule does not demonstrate significantly increased metabolic activity. However, morphologically, adenocarcinoma is still a concern.   - Spirometry 11/24/13 wnl  - 11/24/2013 T surg eval rec> LLower lobectomy 12/15/2013 for adenoca   . CAD (coronary artery disease), non obstructive by cardiac cath 12/12/13 01/24/2014  . Basal cell carcinoma     "cut them off face & right shoulder"  (11/08/2013)    Past Surgical History:  Past Surgical History  Procedure Laterality Date  . Knee arthroscopy Bilateral   . Anterior cervical decomp/discectomy fusion  2000    C5-C6  . Renal artery stent Right 11/08/2013    Archie Endo 11/08/2013  . Tonsillectomy and adenoidectomy  ?1946  . Cholecystectomy  1970's  . Vaginal hysterectomy  1970  . Dilation and curettage of uterus  1960's  . Skin cancer excision    . Coronary angiogram      Hx: of 2/ 2015  . Cardiac catheterization      Hx: of 2/ 2015  . Video bronchoscopy N/A 12/15/2013    Procedure: VIDEO BRONCHOSCOPY;  Surgeon: Grace Isaac, MD;  Location: [facility] OR;  Service: Thoracic;  Laterality: N/A;  . Video assisted thoracoscopy (vats)/wedge resection Left 12/15/2013    Procedure: VIDEO ASSISTED THORACOSCOPY (VATS)/WEDGE RESECTION;  Surgeon: Grace Isaac, MD;  Location: [facility];  Service: Thoracic;  Laterality: Left;  . Lobectomy Left 12/15/2013    Procedure: LOBECTOMY;  Surgeon: Grace Isaac, MD;  Location: [facility] OR;  Service: Thoracic;  Laterality: Left;    Family History:  Family History  Problem Relation Age of Onset  . Breast cancer Sister   . Emphysema Father     smoked  . Allergies Sister   . Allergies Brother   . Heart disease Mother   . Heart disease Father  Social History:  reports that she has never smoked. She has never used smokeless tobacco. She reports that she does not drink alcohol or use illicit drugs.  Allergies:  Allergies  Allergen Reactions  . Benazepril Hcl Anaphylaxis and Cough  . Loratadine Anaphylaxis    anaphylaxis  . Celecoxib     REACTION: aching  . Lisinopril     REACTION: cough  . Allegra [Fexofenadine Hcl]     Severe back pain  . Sulfa Antibiotics Hives    Medications:   Medication List    Notice   Cannot display patient medications because the patient has not yet arrived.     Please HPI for pertinent positives, otherwise complete 10 system ROS negative.  Physical  Exam: BP 190/89  Pulse 67  Temp(Src) 98.1 F (36.7 C) (Oral)  Resp 18  Ht 5\' 5"  (1.651 m)  Wt 240 lb (108.863 kg)  BMI 39.94 kg/m2  SpO2 99% Body mass index is 39.94 kg/(m^2).  General Appearance:  Alert, cooperative, no distress  Head:  Normocephalic, without obvious abnormality, atraumatic  Neck: Supple, symmetrical, trachea midline  Lungs:   Clear to auscultation bilaterally, no w/r/r, respirations unlabored without use of accessory muscles.  Chest Wall:  No tenderness or deformity  Heart:  Regular rate and rhythm, S1, S2 normal, no murmur, rub or gallop.  Abdomen:   Soft, non-tender, non distended, (+) BS  Extremities: Extremities normal, atraumatic, no cyanosis or edema  Pulses: 2+ and symmetric  Neurologic: Normal affect, no gross deficits.   Results for orders placed during the hospital encounter of 02/25/14 (from the past 48 hour(s))  APTT     Status: None   Collection Time    02/25/14  8:16 AM      Result Value Ref Range   aPTT 30  24 - 37 seconds  CBC     Status: Abnormal   Collection Time    02/25/14  8:16 AM      Result Value Ref Range   WBC 7.9  4.0 - 10.5 K/uL   RBC 4.15  3.87 - 5.11 MIL/uL   Hemoglobin 11.6 (*) 12.0 - 15.0 g/dL   HCT 35.9 (*) 36.0 - 46.0 %   MCV 86.5  78.0 - 100.0 fL   MCH 28.0  26.0 - 34.0 pg   MCHC 32.3  30.0 - 36.0 g/dL   RDW 16.5 (*) 11.5 - 15.5 %   Platelets 294  150 - 400 K/uL  PROTIME-INR     Status: None   Collection Time    02/25/14  8:16 AM      Result Value Ref Range   Prothrombin Time 13.1  11.6 - 15.2 seconds   INR 1.01  0.00 - 1.49   No results found.  Assessment/Plan Acute renal failure Proteinuria Request for image guided renal biopsy with moderate sedation. Patient has been NPO, labs reviewed. Risks and Benefits discussed with the patient. All of the patient's questions were answered, patient is agreeable to proceed. Consent signed and in chart.   Hedy Jacob PA-C 02/25/2014, 9:57 AM

## 2014-02-26 DIAGNOSIS — R609 Edema, unspecified: Secondary | ICD-10-CM | POA: Diagnosis not present

## 2014-02-26 DIAGNOSIS — C3490 Malignant neoplasm of unspecified part of unspecified bronchus or lung: Secondary | ICD-10-CM | POA: Diagnosis not present

## 2014-02-26 DIAGNOSIS — R0602 Shortness of breath: Secondary | ICD-10-CM | POA: Diagnosis not present

## 2014-02-26 DIAGNOSIS — IMO0001 Reserved for inherently not codable concepts without codable children: Secondary | ICD-10-CM | POA: Diagnosis not present

## 2014-02-26 LAB — CBC
HCT: 33 % — ABNORMAL LOW (ref 36.0–46.0)
Hemoglobin: 10.2 g/dL — ABNORMAL LOW (ref 12.0–15.0)
MCH: 27.1 pg (ref 26.0–34.0)
MCHC: 30.9 g/dL (ref 30.0–36.0)
MCV: 87.8 fL (ref 78.0–100.0)
Platelets: 288 10*3/uL (ref 150–400)
RBC: 3.76 MIL/uL — AB (ref 3.87–5.11)
RDW: 16.6 % — AB (ref 11.5–15.5)
WBC: 6.8 10*3/uL (ref 4.0–10.5)

## 2014-02-26 MED ORDER — NEBIVOLOL HCL 10 MG PO TABS
20.0000 mg | ORAL_TABLET | Freq: Every morning | ORAL | Status: DC
  Administered 2014-02-26: 20 mg via ORAL
  Filled 2014-02-26 (×2): qty 2

## 2014-02-26 NOTE — Discharge Summary (Signed)
Physician Discharge Summary  Patient ID: Susan Pittman MRN: [id number] DOB/AGE: 1941-03-20 73 y.o.  Admit date: 02/25/2014 Discharge date: 02/26/2014  Admission Diagnoses: Active Problems:   Hematuria post renal biopsy  Discharge Diagnoses:  Active Problems:   Hematuria post renal biopsy    Procedures: S/p US guided (R) renal biopsy 4/24  Discharged Condition: good  Hospital Course: HPI: Susan Pittman is an 73 y.o. female who is here today for a scheduled random renal biopsy. She was seen by Dr. Lorrene Reid 02/17/14 with c/o new onset of cough, LE swelling and shortness of breath since 01/17/14. UA revealed 4+ protein, 3+ occult blood, 30-50 RBC's and 1-2 RBC casts. Renal function elevated. She denies any rash or arthralgias. She denies any gross hematuria or foam looking urine. She was taken to [place] and underwent (R) random renal biopsy. She developed gross hematuria. It did improve during her recovery observation but decision was made to admit the patient for overnight observation. She feels great today. Urine has cleared to normal yellow color, no blood tinged. She has minimal discomfort in the right flank. VS have shown elevated BP which the pt has a hx of. She is taking her normal antihypertensives. She denies CP, SOB, palpitations. Her follow up labs show stable hgb at 10.2. She is stable for discharge. Precautions, restrictions, and follow up plans reviewed.   Consults: None   Discharge Exam: Blood pressure 184/61, pulse 54, temperature 98.3 F (36.8 C), temperature source Oral, resp. rate 18, height 5\' 5"  (1.651 m), weight 241 lb 4.8 oz (109.453 kg), SpO2 100.00%.    Disposition: 01-Home or Self Care  Discharge Orders   Future Appointments Provider Department Dept Phone   03/03/2014 1:30 PM Troy Sine, MD [facility] [phone]   03/16/2014 9:00 AM [name], MD [place] [facility]  [place]   03/24/2014 9:30 AM Grace Isaac, MD [facility] [phone]   08/19/2014 8:00 AM [facility] Only [facility] [phone]   08/19/2014 9:30 AM [facility] [phone]   Liquids only 4 hours prior to your exam. Any medications can be taken as usual. Please arrive 15 min prior to your scheduled exam time.   08/22/2014 11:00 AM Curt Bears, MD [facility] [phone]   Future Orders Complete By Expires   Call MD for:  redness, tenderness, or signs of infection (pain, swelling, redness, odor or green/yellow discharge around incision site)  As directed    Call MD for:  severe uncontrolled pain  As directed    Call MD for:  temperature >100.4  As directed    Call MD for:  As directed    Diet - low sodium heart healthy  As directed    Increase activity slowly  As directed    May shower / Bathe  As directed    May walk up steps  As directed    No dressing needed  As directed        Medication List  Current facility-administered medications:acetaminophen (TYLENOL) tablet 1,000 mg, 1,000 mg, Oral, Q8H PRN  furosemide (LASIX) tablet 80 mg, 80 mg, Oral, BID,  hydrALAZINE (APRESOLINE) tablet 25 mg, 25 mg, Oral, TID,  HYDROcodone-acetaminophen (NORCO/VICODIN) 5-325 MG per tablet 1-2 tablet, 1-2 tablet, Oral, Q4H PRN,   levothyroxine (SYNTHROID, LEVOTHROID) tablet 88 mcg, 88 mcg, Oral, QAC breakfast,   nebivolol (BYSTOLIC) tablet 20 mg, 20 mg, Oral,  q morning - 10a,  pantoprazole (PROTONIX) EC tablet 20 mg, 20 mg, Oral, Daily,       Follow-up Information   Follow up with Carylon Perches, MD. Dennis Bast may call [facility] as well with concerns at [phone])    Specialty:  Interventional Radiology   Contact information:   [place] [address] [phone]       Signed: Ascencion Dike PA-C 02/26/2014, 11:25 AM

## 2014-02-26 NOTE — Progress Notes (Signed)
Discharged instructions reviewed with patient. Patient  verbally stated understanding of discharge instructions and to resume home medications.

## 2014-02-26 NOTE — Discharge Instructions (Signed)
Kidney Biopsy, Care After Refer to this sheet in the next few weeks. These instructions provide you with information on caring for yourself after your procedure. Your health care provider may also give you more specific instructions. Your treatment has been planned according to current medical practices, but problems sometimes occur. Call your health care provider if you have any problems or questions after your procedure.  WHAT TO EXPECT AFTER THE PROCEDURE  You may notice blood in the urine for the first 24 hours after the biopsy. You may feel some pain at the biopsy site for 1 2 weeks after the biopsy. HOME CARE INSTRUCTIONS Do not lift anything heavier than 10 lb (4.5 kg) for 2 weeks. Do not take any non-steroidal anti-inflammatory drugs (NSAIDs) or any blood thinners for a week after the biopsy unless instructed to do so by your health care provider. Only take medicines for pain, fever, or discomfort as directed by your health care provider. SEEK MEDICAL CARE IF: You have bloody urine more than 24 hours after the biopsy.  You develop a fever.  You cannot urinate.  You have increasing pain at the biopsy site.  SEEK IMMEDIATE MEDICAL CARE IF: You feel faint or dizzy.  Document Released: 06/23/2013 Document Reviewed: 06/23/2013 [facility] Patient Information 2014 [place].

## 2014-03-02 ENCOUNTER — Other Ambulatory Visit: Payer: Self-pay | Admitting: Radiology

## 2014-03-02 ENCOUNTER — Telehealth: Payer: Self-pay | Admitting: Cardiovascular Disease

## 2014-03-02 ENCOUNTER — Telehealth: Payer: Self-pay | Admitting: Radiology

## 2014-03-02 DIAGNOSIS — R319 Hematuria, unspecified: Secondary | ICD-10-CM

## 2014-03-02 NOTE — Progress Notes (Signed)
IR PA has been notified that the patient is still having intermittent hematuria and denies any right flank pain s/p right kidney biopsy 4/24. She states she has voided clear urine as well, but that she did start noticing more blood change in color today. She is also complaining of increased BP despite taking her medication. She currently takes hydralazine 20mg  TID and bystolic 20mg  in the morning. She states her BP today is 204/111 mmHg and has remained this high after taking it in the morning and in the afternoon. She does have an appointment with Dr. Claiborne Billings tomorrow her cardiologist at 1:30pm and he manages her BP. I have contacted Dr. Evette Georges office and the RN I spoke to will contact the patient today. I have spoke to Dr. Anselm Pancoast today regarding the above and we have scheduled the patient for a renal ultrasound tomorrow 4/30 at [place] at 0830 am to rule out renal hematoma or pseudoaneurysm. I have discussed all of the above with the patient today and she states understanding.   Tsosie Billing PA-C Interventional Radiology  03/02/2014 3:23 PM

## 2014-03-02 NOTE — Telephone Encounter (Signed)
Spoke with [place], [place] - she had called patient today with results of kidney biopsy from 4/24 and patient reported BP was 204/111. No other current complaints other than high BP and hematuria, for which biopsy was ordered. Patient is supposed to have renal doppler tomorrow before appointment with Dr. Claiborne Billings.  RN spoke with patient who states "I don't feel bad now" but reports she had a headache last night. Denies CP/other symptoms.   BP readings 9:50am 226/111 10:10am 190/111 11:00am 204/11  BP medications reviewed with patient and she is taking as prescribed on hospital discharge. Will forward message to [place], Hershal Coria, who is in office today, and Dr. Claiborne Billings (since patient will see him on 4/30)

## 2014-03-03 ENCOUNTER — Ambulatory Visit (HOSPITAL_COMMUNITY)
Admission: RE | Admit: 2014-03-03 | Discharge: 2014-03-03 | Disposition: A | Discharge status: Home / Self Care | Payer: Medicare Other | Source: Ambulatory Visit | Attending: Radiology | Admitting: Radiology

## 2014-03-03 ENCOUNTER — Telehealth: Payer: Self-pay | Admitting: Diagnostic Radiology

## 2014-03-03 ENCOUNTER — Encounter: Payer: Self-pay | Admitting: Cardiovascular Disease

## 2014-03-03 ENCOUNTER — Ambulatory Visit (INDEPENDENT_AMBULATORY_CARE_PROVIDER_SITE_OTHER): Payer: Medicare Other | Admitting: Cardiovascular Disease

## 2014-03-03 VITALS — BP 136/72 | HR 61 | Ht 65.0 in | Wt 244.7 lb

## 2014-03-03 DIAGNOSIS — R319 Hematuria, unspecified: Secondary | ICD-10-CM | POA: Insufficient documentation

## 2014-03-03 DIAGNOSIS — R911 Solitary pulmonary nodule: Secondary | ICD-10-CM

## 2014-03-03 DIAGNOSIS — R0602 Shortness of breath: Secondary | ICD-10-CM

## 2014-03-03 DIAGNOSIS — C3430 Malignant neoplasm of lower lobe, unspecified bronchus or lung: Secondary | ICD-10-CM

## 2014-03-03 DIAGNOSIS — Z902 Acquired absence of lung [part of]: Secondary | ICD-10-CM

## 2014-03-03 DIAGNOSIS — R609 Edema, unspecified: Secondary | ICD-10-CM

## 2014-03-03 DIAGNOSIS — I10 Essential (primary) hypertension: Secondary | ICD-10-CM | POA: Diagnosis not present

## 2014-03-03 DIAGNOSIS — R0989 Other specified symptoms and signs involving the circulatory and respiratory systems: Secondary | ICD-10-CM

## 2014-03-03 DIAGNOSIS — Z9889 Other specified postprocedural states: Secondary | ICD-10-CM

## 2014-03-03 DIAGNOSIS — I2510 Atherosclerotic heart disease of native coronary artery without angina pectoris: Secondary | ICD-10-CM

## 2014-03-03 DIAGNOSIS — I519 Heart disease, unspecified: Secondary | ICD-10-CM

## 2014-03-03 DIAGNOSIS — I5189 Other ill-defined heart diseases: Secondary | ICD-10-CM

## 2014-03-03 DIAGNOSIS — I701 Atherosclerosis of renal artery: Secondary | ICD-10-CM

## 2014-03-03 DIAGNOSIS — E039 Hypothyroidism, unspecified: Secondary | ICD-10-CM

## 2014-03-03 DIAGNOSIS — R809 Proteinuria, unspecified: Secondary | ICD-10-CM | POA: Diagnosis not present

## 2014-03-03 DIAGNOSIS — R600 Localized edema: Secondary | ICD-10-CM

## 2014-03-03 DIAGNOSIS — R0609 Other forms of dyspnea: Secondary | ICD-10-CM

## 2014-03-03 MED ORDER — HYDRALAZINE HCL 50 MG PO TABS: 50.0000 mg | ORAL_TABLET | Freq: Three times a day (TID) | ORAL | Status: DC

## 2014-03-03 NOTE — Patient Instructions (Signed)
Your physician has recommended you make the following change in your medication: increase the hydralazine to 50 mg three times daily.  Compression Stockings Compression stockings are elastic stockings that "compress" your legs. This helps to increase blood flow, decrease swelling, and reduces the chance of getting blood clots in your lower legs. Compression stockings are used:  After surgery.  If you have a history of poor circulation.  If you are prone to blood clots.  If you have varicose veins.  If you sit or are bedridden for long periods of time. WEARING COMPRESSION STOCKINGS  Your compression stockings should be worn as instructed by your caregiver.  Wearing the correct stocking size is important. Your caregiver can help measure and fit you to the correct size.  When wearing your stockings, do not allow the stockings to bunch up. This is especially important around your toes or behind your knees. Keep the stockings as smooth as possible.  Do not roll the stockings downward and leave them rolled down. This can form a restrictive band around your legs and can decrease blood flow.  The stockings should be removed once a day for 1 hour or as instructed by your caregiver. When the stockings are taken off, inspect your legs and feet. Look for:  Open sores.  Red spots.  Puffy areas (swelling).  Anything that does not seem normal. IMPORTANT INFORMATION ABOUT COMPRESSION STOCKINGS  The compression stockings should be clean, dry, and in good condition before you put them on.  Do not put lotion on your legs or feet. This makes it harder to put the stockings on.  Change your stockings immediately if they become wet or soiled.  Do not wear stockings that are ripped or torn.  You may hand-wash or put your stockings in the washing machine. Use cold or warm water with mild detergent. Do not bleach your stockings. They may be air-dried or dried in the dryer on low heat.  If you have  pain or have a feeling of "pins and needles" in your feet or legs, you may be wearing stockings that are too tight. Call your caregiver right away. SEEK IMMEDIATE MEDICAL CARE IF:   You have numbness or tingling in your lower legs that does not get better quickly after the stockings are removed.  Your toes or feet become cold and blue.  You develop open sores or have red spots on your legs that do not go away. MAKE SURE YOU:   Understand these instructions.  Will watch your condition.  Will get help right away if you are not doing well or get worse. Document Released: 08/18/2009 Document Revised: 01/13/2012 Document Reviewed: 08/18/2009 [facility] Patient Information 2014 [place], [place].   Your physician recommends that you schedule a follow-up appointment in: 2 months

## 2014-03-03 NOTE — Telephone Encounter (Signed)
Spoke with patient about the hematuria.  Patient says the hematuria is better today and she saw her cardiologist about her blood pressure.  [place] today showed a small fluid collection at the biopsy site which is expected.  No large hematoma.  Spoke with Dr. Lorrene Reid and she is aware that the patient is still having intermittent hematuria.  Patient is scheduled to see Dr. Lorrene Reid next week to discuss the renal biopsy findings.  Patient will contact us if the hematuria progresses.

## 2014-03-04 NOTE — Telephone Encounter (Signed)
Patient seen in clinic 03/03/14

## 2014-03-06 ENCOUNTER — Encounter: Payer: Self-pay | Admitting: Cardiovascular Disease

## 2014-03-06 NOTE — Progress Notes (Signed)
Patient ID: Susan Pittman, female   DOB: July 23, 1941, 73 y.o.   MRN: [id number]     HPI: [name] is a 73 year old female who presents for cardiology followup evaluation.  She is followed by Dr. Leonarda Salon for primary medical care.   Susan Pittman has a long-standing history of hypertension for at least 30-40 years. She initially was treated with diuretic therapy. Approximately 20 years ago she had undergone a cardiac catheterization and was not told of having significant coronary artery blockage. Additional problems have included significant arthritis and she has undergone knee arthroscopic surgery. She has a history of hypothyroidism, remote peptic ulcer disease in the 1980s, as well as GERD. She also does note palpitations typically in the evening.  She was referred to me initially in December 2014 for labile blood pressure control.  Additional problems also include hypothyroidism, osteoporosis, mild hyperlipidemia for which she's been followed medically.  As part of her workup for hypertension, she was found to have significant renal artery stenosis.  On 11/08/2013 she underwent stenting of her right renal artery by Dr. Gwenlyn Found after a renal Doppler suggested hemodynamically significant right renal artery stenosis.  She subsequently presented to the hospital and on 12/12/2013 underwent cardiac catheterization after presenting with chest pain.  She was found to have mild nonobstructive CAD with smooth 20% RCA narrowing.  Ejection fraction was 65%.  She had previously undergone stenting of her right renal artery and this was widely patent.  She was found to have lung nodules and on 12/15/2013 underwent VATS and mini thoracotomy with lobectomy of the left lower lobe with lymph node dissection for adenocarcinoma.  Subsequently, this has been stable.  However, she has developed progressive lower extremity edema despite diuretic therapy and renal insufficiency.  I had seen her several weeks ago with lower  angle and at that time.  Due to significant leg swelling.  I discontinued her amlodipine and started on hydralazine at 25 mg every 8 hours.   Her peripheral edema has improved, but she still notes leg swelling.  She has been evaluated by Dr. Lorrene Reid, of nephrology, and ultimately underwent a kidney biopsy last week.  She is still awaiting the results of this pathology report.  She has been demonstrated to have mild proteinuria, not nephrotic.She tells yesterday, when she took her blood pressure at homevi was 226/111.She presents for evaluation.  She admits to difficulty with sleep. She does snore. She goes to bed sometime between 8:30 and 11 PM and wakes up between 5:30 and 6:30 AM. She often wakes up approximately 2 times per night. Her sleep is not highly restorative.   Past Medical History  Diagnosis Date  . Asthma   . Hypertension   . GERD (gastroesophageal reflux disease)   . Allergy   . Hypothyroid   . Osteoporosis   . Gastric ulcer   . Hiatal hernia   . Hyperlipidemia     "don't take RX for it" (11/08/2013)  . Renal artery stenosis   . Lung mass     superior segment LLL/notes 11/08/2013  . Pneumonia     "I've had it ?3 times" (11/08/2013)  . Arthritis     "knees, hips, back, hands" (11/08/2013)  . DJD (degenerative joint disease)   . Lung nodule/ adenoca > LLobectomy 12/15/13  11/08/2013    PET 11/23/2013 1. Dominant sub solid left lower lobe nodule does not demonstrate significantly increased metabolic activity. However, morphologically, adenocarcinoma is still a concern.   - Spirometry 11/24/13 wnl  -  11/24/2013 T surg eval rec> LLower lobectomy 12/15/2013 for adenoca   . CAD (coronary artery disease), non obstructive by cardiac cath 12/12/13 01/24/2014  . Basal cell carcinoma     "cut them off face & right shoulder" (11/08/2013)    Past Surgical History  Procedure Laterality Date  . Knee arthroscopy Bilateral   . Anterior cervical decomp/discectomy fusion  2000    C5-C6  . Renal artery  stent Right 11/08/2013    Archie Endo 11/08/2013  . Tonsillectomy and adenoidectomy  ?1946  . Cholecystectomy  1970's  . Vaginal hysterectomy  1970  . Dilation and curettage of uterus  1960's  . Skin cancer excision    . Coronary angiogram      Hx: of 2/ 2015  . Cardiac catheterization      Hx: of 2/ 2015  . Video bronchoscopy N/A 12/15/2013    Procedure: VIDEO BRONCHOSCOPY;  Surgeon: Grace Isaac, MD;  Location: [facility] OR;  Service: Thoracic;  Laterality: N/A;  . Video assisted thoracoscopy (vats)/wedge resection Left 12/15/2013    Procedure: VIDEO ASSISTED THORACOSCOPY (VATS)/WEDGE RESECTION;  Surgeon: Grace Isaac, MD;  Location: [facility];  Service: Thoracic;  Laterality: Left;  . Lobectomy Left 12/15/2013    Procedure: LOBECTOMY;  Surgeon: Grace Isaac, MD;  Location: [facility];  Service: Thoracic;  Laterality: Left;    Allergies  Allergen Reactions  . Benazepril Hcl Anaphylaxis and Cough  . Loratadine Anaphylaxis    anaphylaxis  . Celecoxib     REACTION: aching  . Lisinopril     REACTION: cough  . Allegra [Fexofenadine Hcl]     Severe back pain  . Sulfa Antibiotics Hives    Current Outpatient Prescriptions  Medication Sig Dispense Refill  . acetaminophen (TYLENOL) 500 MG tablet Take 1,000 mg by mouth every 8 (eight) hours as needed for moderate pain.      Marland Kitchen albuterol (PROVENTIL HFA;VENTOLIN HFA) 108 (90 BASE) MCG/ACT inhaler Inhale 1-2 puffs into the lungs every 6 (six) hours as needed for wheezing or shortness of breath.  18 g  1  . aspirin EC 81 MG tablet Take 81 mg by mouth daily.      . Calcium Carb-Cholecalciferol 600-800 MG-UNIT TABS Take 1 tablet by mouth daily.      . cetirizine (ZYRTEC) 10 MG tablet Take 10 mg by mouth at bedtime.       Marland Kitchen etodolac (LODINE) 400 MG tablet Take 400 mg by mouth 2 (two) times daily.      . fluticasone (FLONASE) 50 MCG/ACT nasal spray Place 2 sprays into both nostrils daily as needed for allergies or rhinitis.      . furosemide (LASIX) 80 MG  tablet Take 80 mg by mouth 2 (two) times daily.      . lansoprazole (PREVACID) 15 MG capsule Take 15 mg by mouth daily.        Marland Kitchen levothyroxine (SYNTHROID, LEVOTHROID) 88 MCG tablet Take 88 mcg by mouth daily before breakfast.      . Nebivolol HCl (BYSTOLIC) 20 MG TABS Take 20 mg by mouth every morning.      . hydrALAZINE (APRESOLINE) 50 MG tablet Take 1 tablet (50 mg total) by mouth 3 (three) times daily.  90 tablet  11   No current facility-administered medications for this visit.    Social history is notable in that she's been married for 54 years. She has 3 children 3 grandchildren 2 great-grandchildren. She is retired as a Secondary school teacher for [employer]. She  completed 12th grade of education. There is no tobacco or alcohol use.  Family History  Problem Relation Age of Onset  . Breast cancer Sister   . Emphysema Father     smoked  . Allergies Sister   . Allergies Brother   . Heart disease Mother   . Heart disease Father     ROS is negative for fever chills or night sweats. She denies constitutional symptoms and is without headache. She denies change in vision or significant weight. There are no hearing issues. She denies difficulty with swallowing or hoarseness. She is unaware of lymphadenopathy. She denies wheezing. She denies PND or orthopnea. She does note an occasional palpitation. She denies presyncope or syncope. She denies recurrent chest pressure.  There is a remote history of peptic ulcer disease. She denies nausea vomiting or diarrhea. She is unaware of blood in her stool or urine. She denies claudication symptoms. She denies paresthesias. She does have a history of hypothyroidism.She does have allergies and takes Zyrtec. She also does have arthritic issues and has taken Lodine.  There is no diabetes per. Her sleep is nonrestorative. She does snore. She is unaware of restless legs. Other comprehensive 14 point system review is negative.  PE BP 136/72  Pulse 61  Ht 5\' 5"   (1.651 m)  Wt 244 lb 11.2 oz (110.995 kg)  BMI 40.72 kg/m2 Repeat blood pressure 152/80 when taken by me. General: Alert, oriented, no distress.  Skin: normal turgor, no rashes HEENT: Normocephalic, atraumatic. Pupils round and reactive; sclera anicteric; extraocular muscles are full; Fundi arterial narrowing Nose without nasal septal hypertrophy Mouth/Parynx benign; Mallinpatti scale 3 Neck: No JVD, no carotid bruits; normal carotid upstroke Lungs: clear to ausculatation and percussion; no wheezing or rales Chest wall: without tenderness to palpitation Heart: RRR, s1 s2 YHCWCB;76 systolic murmur. Soft S4. No S3 gallop. No ectopy Abdomen: soft, nontender; no hepatosplenomehaly, BS+; abdominal aorta nontender and not dilated by palpation. Back: no CVA tenderness Pulses 2+ Extremities: 1-2+lower extremity edema at the ankles, improved since off amlodipine; no clubbinbg cyanosis, Homan's sign negative  Neurologic: grossly nonfocal; Cranial nerves grossly wnl Psychologic: Normal mood and affect  ECG (independently read by me): Normal sinus rhythm at 61 beats per minute.  No ectopy.  No ST segment changes.  Prior December ECG: Sinus 58 beats per minute with occasional PACs normal intervals. Nonspecific ST changes.  LABS:  BMET    Component Value Date/Time   NA 140 02/14/2014 1030   K 4.6 02/14/2014 1030   CL 105 02/14/2014 1030   CO2 24 02/14/2014 1030   GLUCOSE 79 02/14/2014 1030   BUN 42* 02/14/2014 1030   CREATININE 2.34* 02/14/2014 1030   CREATININE 2.6* 02/03/2014 0839   CALCIUM 8.4 02/14/2014 1030   GFRNONAA 43* 12/17/2013 0410   GFRAA 49* 12/17/2013 0410     Hepatic Function Panel     Component Value Date/Time   PROT 5.8* 02/14/2014 1030   ALBUMIN 2.8* 02/14/2014 1030   AST 13 02/14/2014 1030   ALT 8 02/14/2014 1030   ALKPHOS 72 02/14/2014 1030   BILITOT 0.8 02/14/2014 1030   BILIDIR 0.0 09/13/2013 0844     CBC    Component Value Date/Time   WBC 6.8 02/26/2014 0750   RBC  3.76* 02/26/2014 0750   HGB 10.2* 02/26/2014 0750   HCT 33.0* 02/26/2014 0750   PLT 288 02/26/2014 0750   MCV 87.8 02/26/2014 0750   MCH 27.1 02/26/2014 0750   MCHC 30.9 02/26/2014  0750   RDW 16.6* 02/26/2014 0750   LYMPHSABS 1.7 01/18/2014 1204   MONOABS 0.7 01/18/2014 1204   EOSABS 0.4 01/18/2014 1204   BASOSABS 0.0 01/18/2014 1204     BNP    Component Value Date/Time   PROBNP 3400.00* 01/25/2014 1314    Lipid Panel     Component Value Date/Time   CHOL 180 10/13/2013 0846   TRIG 105 10/13/2013 0846   HDL 52 10/13/2013 0846   CHOLHDL 3.5 10/13/2013 0846   VLDL 21 10/13/2013 0846   LDLCALC 107* 10/13/2013 0846     RADIOLOGY: No results found.   ASSESSMENT AND PLAN: Susan Pittman is a 73 year old female who has a 30-40 year history of hypertension , who had recently  developed stage II hypertension, and was found to have high-grade right renal artery stenosis.  She underwent right renal artery stenting in January.  In February she presented to the hospital with chest pain symptomatology and cardiac catheterization revealed mild nonobstructive CAD.  She was found to have lung nodule, which proved to be adenocarcinoma and she underwent minithoracotomy and left lobectomy.  She has continued to experience progressive difficulty with blood pressure control, as well as lower extremity edema.  She underwent a renal biopsy last week.  Results are still pending.  Her blood pressure today is still elevated.  I am recommending further titration of her hydralazine from 25 mg every 8 hours to 50 mg every 8 hours.  I also recommended support stockings at 20-30 mm pressure.  She will be seeing Dr. Lorrene Reid next week for followup of her renal biopsy and renal evaluation.  She's not having any anginal symptoms.  Laboratory will be rechecked by Dr. Lorrene Reid and I will ask that these be forwarded to my office for my review. I'll see her back in the office in 2 months for cardiology reevaluation.  Troy Sine,  MD, [facility] 03/06/2014 9:29 PM

## 2014-03-07 DIAGNOSIS — R222 Localized swelling, mass and lump, trunk: Secondary | ICD-10-CM | POA: Diagnosis not present

## 2014-03-07 DIAGNOSIS — N059 Unspecified nephritic syndrome with unspecified morphologic changes: Secondary | ICD-10-CM | POA: Diagnosis not present

## 2014-03-07 DIAGNOSIS — I129 Hypertensive chronic kidney disease with stage 1 through stage 4 chronic kidney disease, or unspecified chronic kidney disease: Secondary | ICD-10-CM | POA: Diagnosis not present

## 2014-03-08 ENCOUNTER — Telehealth: Payer: Self-pay

## 2014-03-08 NOTE — Telephone Encounter (Signed)
Caller: Dr. Lorrene Reid  Pager # [phone]  Call from Dr.Dunham and she wanted you to be aware that the patient has a Dx of IgA nephropathy per her Biopsy results. The patient will be on Prednisone 100 mg qd indefinitely. She will send over a copy of the biopsy for your records. She said the patient pretreatment baseline Creatinine is 2 and Proteinuria is 8.3. If you would like to discuss with her, she wants you to page her.      KP

## 2014-03-14 ENCOUNTER — Ambulatory Visit (INDEPENDENT_AMBULATORY_CARE_PROVIDER_SITE_OTHER): Payer: Medicare Other | Admitting: Physician Assistant

## 2014-03-14 ENCOUNTER — Telehealth: Payer: Self-pay

## 2014-03-14 ENCOUNTER — Encounter: Payer: Self-pay | Admitting: Physician Assistant

## 2014-03-14 ENCOUNTER — Encounter (HOSPITAL_COMMUNITY): Payer: Self-pay

## 2014-03-14 VITALS — BP 191/81 | HR 67 | Temp 98.2°F | Resp 16 | Ht 65.0 in | Wt 246.0 lb

## 2014-03-14 DIAGNOSIS — I10 Essential (primary) hypertension: Secondary | ICD-10-CM

## 2014-03-14 DIAGNOSIS — I2510 Atherosclerotic heart disease of native coronary artery without angina pectoris: Secondary | ICD-10-CM

## 2014-03-14 NOTE — Patient Instructions (Signed)
Increase Hydralazine to 100 mg (2 tablets) three times per day.  Continue other medications as directed.  Continue monitoring BP at home.  Follow-up with Dr. Claiborne Billings in 1 week.  If you are unable to see him within 1-2 weeks, please follow-up with Dr. [name] or myself.  If at anytime you develop chest pain or SOB, please proceed to the ER or call 911.  Hypertension As your heart beats, it forces blood through your arteries. This force is your blood pressure. If the pressure is too high, it is called hypertension (HTN) or high blood pressure. HTN is dangerous because you may have it and not know it. High blood pressure may mean that your heart has to work harder to pump blood. Your arteries may be narrow or stiff. The extra work puts you at risk for heart disease, stroke, and other problems.  Blood pressure consists of two numbers, a higher number over a lower, 110/72, for example. It is stated as "110 over 72." The ideal is below 120 for the top number (systolic) and under 80 for the bottom (diastolic). Write down your blood pressure today. You should pay close attention to your blood pressure if you have certain conditions such as:  Heart failure.  Prior heart attack.  Diabetes  Chronic kidney disease.  Prior stroke.  Multiple risk factors for heart disease. To see if you have HTN, your blood pressure should be measured while you are seated with your arm held at the level of the heart. It should be measured at least twice. A one-time elevated blood pressure reading (especially in the Emergency Department) does not mean that you need treatment. There may be conditions in which the blood pressure is different between your right and left arms. It is important to see your caregiver soon for a recheck. Most people have essential hypertension which means that there is not a specific cause. This type of high blood pressure may be lowered by changing lifestyle factors such as:  Stress.  Smoking.  Lack  of exercise.  Excessive weight.  Drug/tobacco/alcohol use.  Eating less salt. Most people do not have symptoms from high blood pressure until it has caused damage to the body. Effective treatment can often prevent, delay or reduce that damage. TREATMENT  When a cause has been identified, treatment for high blood pressure is directed at the cause. There are a large number of medications to treat HTN. These fall into several categories, and your caregiver will help you select the medicines that are best for you. Medications may have side effects. You should review side effects with your caregiver. If your blood pressure stays high after you have made lifestyle changes or started on medicines,   Your medication(s) may need to be changed.  Other problems may need to be addressed.  Be certain you understand your prescriptions, and know how and when to take your medicine.  Be sure to follow up with your caregiver within the time frame advised (usually within two weeks) to have your blood pressure rechecked and to review your medications.  If you are taking more than one medicine to lower your blood pressure, make sure you know how and at what times they should be taken. Taking two medicines at the same time can result in blood pressure that is too low. SEEK IMMEDIATE MEDICAL CARE IF:  You develop a severe headache, blurred or changing vision, or confusion.  You have unusual weakness or numbness, or a faint feeling.  You have  severe chest or abdominal pain, vomiting, or breathing problems. MAKE SURE YOU:   Understand these instructions.  Will watch your condition.  Will get help right away if you are not doing well or get worse. Document Released: 10/21/2005 Document Revised: 01/13/2012 Document Reviewed: 06/10/2008 [facility] Patient Information 2014 [place].

## 2014-03-14 NOTE — Progress Notes (Signed)
Pre visit review using our clinic review tool, if applicable. No additional management support is needed unless otherwise documented below in the visit note/SLS  

## 2014-03-14 NOTE — Telephone Encounter (Signed)
Patient called for BP check. States that her BP has been elevated for several days. Advised for patient to come in to be seen. Put on schedule.

## 2014-03-14 NOTE — Assessment & Plan Note (Signed)
Unfortunately patient unable to have ACEI/ARB giving IgA Nephropathy.  Cannot tolerate CCBs due to swelling.  Patient is maxed out on Lasix.  Will increase hydralazine to 100 mg TID. Patient to follow-up with her Cardiologist in 1 week.  Follow-up with Nephrologist as scheduled.  Follow-up with PCP in 1 week if unable to see Cardiologist in timely fashion.  Educated on alarm signs/symptoms and when to proceed to ER if indicated.  Patient and husband voice understanding.

## 2014-03-14 NOTE — Progress Notes (Signed)
Patient presents to clinic today c/o continued elevation of blood pressure.  Patient endorses BP readings at home of up to 220/110.  Patient recently seen by her Cardiologist who increased her Hydralazine to 50 mg TID.  Patient denies chest pain, palpitations, LH, dizziness, headache or significant vision changes.  Patient with history of CHF, nonobstructive CAD, IgA Nephropathy and Renal Artery Stenosis s/p stent placement.    Past Medical History  Diagnosis Date  . Asthma   . Hypertension   . GERD (gastroesophageal reflux disease)   . Allergy   . Hypothyroid   . Osteoporosis   . Gastric ulcer   . Hiatal hernia   . Hyperlipidemia     "don't take RX for it" (11/08/2013)  . Renal artery stenosis   . Lung mass     superior segment LLL/notes 11/08/2013  . Pneumonia     "I've had it ?3 times" (11/08/2013)  . Arthritis     "knees, hips, back, hands" (11/08/2013)  . DJD (degenerative joint disease)   . Lung nodule/ adenoca > LLobectomy 12/15/13  11/08/2013    PET 11/23/2013 1. Dominant sub solid left lower lobe nodule does not demonstrate significantly increased metabolic activity. However, morphologically, adenocarcinoma is still a concern.   - Spirometry 11/24/13 wnl  - 11/24/2013 T surg eval rec> LLower lobectomy 12/15/2013 for adenoca   . CAD (coronary artery disease), non obstructive by cardiac cath 12/12/13 01/24/2014  . Basal cell carcinoma     "cut them off face & right shoulder" (11/08/2013)    Current Outpatient Prescriptions on File Prior to Visit  Medication Sig Dispense Refill  . acetaminophen (TYLENOL) 500 MG tablet Take 1,000 mg by mouth every 8 (eight) hours as needed for moderate pain.      Marland Kitchen albuterol (PROVENTIL HFA;VENTOLIN HFA) 108 (90 BASE) MCG/ACT inhaler Inhale 1-2 puffs into the lungs every 6 (six) hours as needed for wheezing or shortness of breath.  18 g  1  . Calcium Carb-Cholecalciferol 600-800 MG-UNIT TABS Take 1 tablet by mouth daily.      . cetirizine (ZYRTEC) 10 MG tablet Take  10 mg by mouth at bedtime.       . fluticasone (FLONASE) 50 MCG/ACT nasal spray Place 2 sprays into both nostrils daily as needed for allergies or rhinitis.      . furosemide (LASIX) 80 MG tablet Take 80 mg by mouth 2 (two) times daily.      . hydrALAZINE (APRESOLINE) 50 MG tablet Take 1 tablet (50 mg total) by mouth 3 (three) times daily.  90 tablet  11  . lansoprazole (PREVACID) 15 MG capsule Take 15 mg by mouth daily.        Marland Kitchen levothyroxine (SYNTHROID, LEVOTHROID) 88 MCG tablet Take 88 mcg by mouth daily before breakfast.      . Nebivolol HCl (BYSTOLIC) 20 MG TABS Take 20 mg by mouth every morning.       No current facility-administered medications on file prior to visit.    Allergies  Allergen Reactions  . Benazepril Hcl Anaphylaxis and Cough  . Loratadine Anaphylaxis    anaphylaxis  . Celecoxib     REACTION: aching  . Lisinopril     REACTION: cough  . Allegra [Fexofenadine Hcl]     Severe back pain  . Sulfa Antibiotics Hives    Family History  Problem Relation Age of Onset  . Breast cancer Sister   . Emphysema Father     smoked  . Allergies Sister   .  Allergies Brother   . Heart disease Mother   . Heart disease Father     History   Social History  . Marital Status: Married    Spouse Name: N/A    Number of Children: N/A  . Years of Education: N/A   Occupational History  . Retired    Social History Main Topics  . Smoking status: Never Smoker   . Smokeless tobacco: Never Used  . Alcohol Use: No  . Drug Use: No  . Sexual Activity: Yes   Other Topics Concern  . None   Social History Narrative  . None   Review of Systems - See HPI.  All other ROS are negative.  BP 191/81  Pulse 67  Temp(Src) 98.2 F (36.8 C) (Oral)  Resp 16  Ht 5' 5"  (1.651 m)  Wt 246 lb (111.585 kg)  BMI 40.94 kg/m2  SpO2 95%  Physical Exam  Vitals reviewed. Constitutional: She is oriented to person, place, and time and well-developed, well-nourished, and in no distress.   HENT:  Head: Normocephalic and atraumatic.  Eyes: Conjunctivae are normal. Pupils are equal, round, and reactive to light.  Neck: Neck supple.  Cardiovascular: Normal rate, regular rhythm and intact distal pulses.   Murmur heard. Pulmonary/Chest: Effort normal and breath sounds normal. No respiratory distress. She has no wheezes. She has no rales. She exhibits no tenderness.  Neurological: She is alert and oriented to person, place, and time. No cranial nerve deficit.  Skin: Skin is warm and dry. No rash noted.  Psychiatric: Affect normal.    Recent Results (from the past 2160 hour(s))  URINALYSIS, ROUTINE W REFLEX MICROSCOPIC     Status: Abnormal   Collection Time    12/15/13  7:09 AM      Result Value Ref Range   Color, Urine AMBER (*) YELLOW   Comment: BIOCHEMICALS MAY BE AFFECTED BY COLOR   APPearance HAZY (*) CLEAR   Specific Gravity, Urine 1.028  1.005 - 1.030   pH 5.5  5.0 - 8.0   Glucose, UA NEGATIVE  NEGATIVE mg/dL   Hgb urine dipstick NEGATIVE  NEGATIVE   Bilirubin Urine LARGE (*) NEGATIVE   Ketones, ur 15 (*) NEGATIVE mg/dL   Protein, ur NEGATIVE  NEGATIVE mg/dL   Urobilinogen, UA 0.2  0.0 - 1.0 mg/dL   Nitrite NEGATIVE  NEGATIVE   Leukocytes, UA SMALL (*) NEGATIVE  URINE MICROSCOPIC-ADD ON     Status: Abnormal   Collection Time    12/15/13  7:09 AM      Result Value Ref Range   Squamous Epithelial / LPF RARE  RARE   WBC, UA 0-2  <3 WBC/hpf   Bacteria, UA FEW (*) RARE  SURGICAL PCR SCREEN     Status: None   Collection Time    12/15/13  7:40 AM      Result Value Ref Range   MRSA, PCR NEGATIVE  NEGATIVE   Staphylococcus aureus NEGATIVE  NEGATIVE   Comment:            The Xpert SA Assay (FDA     approved for NASAL specimens     in patients over 27 years of age),     is one component of     a comprehensive surveillance     program.  Test performance has     been validated by [employer] for patients greater     than or equal to 37 year old.  It is  not intended     to diagnose infection nor to     guide or monitor treatment.  FUNGUS CULTURE W SMEAR     Status: None   Collection Time    12/15/13  9:06 AM      Result Value Ref Range   Specimen Description BRONCHIAL WASHINGS     Special Requests LLL     Fungal Smear       Value: NO YEAST OR FUNGAL ELEMENTS SEEN     Performed at [facility]   Culture       Value: No Fungi Isolated in 4 Weeks     Performed at [facility]   Report Status 01/12/2014 FINAL    CULTURE, RESPIRATORY (NON-EXPECTORATED)     Status: None   Collection Time    12/15/13  9:06 AM      Result Value Ref Range   Specimen Description BRONCHIAL WASHINGS     Special Requests LLL     Gram Stain       Value: RARE WBC PRESENT, PREDOMINANTLY PMN     NO SQUAMOUS EPITHELIAL CELLS SEEN     NO ORGANISMS SEEN     Performed at [facility]   Culture       Value: Non-Pathogenic Oropharyngeal-type Flora Isolated.     Performed at [facility]   Report Status 12/18/2013 FINAL    AFB CULTURE WITH SMEAR     Status: None   Collection Time    12/15/13 10:47 AM      Result Value Ref Range   Specimen Description TISSUE LUNG LEFT     Special Requests LLL LESION PT ON ANCEF     Acid Fast Smear       Value: NO ACID FAST BACILLI SEEN     Performed at [facility]   Culture       Value: NO ACID FAST BACILLI ISOLATED IN 6 WEEKS     Performed at [facility]   Report Status 01/27/2014 FINAL    TISSUE CULTURE     Status: None   Collection Time    12/15/13 10:47 AM      Result Value Ref Range   Specimen Description TISSUE LUNG LEFT     Special Requests LLL LESION PT ON ANCEF     Gram Stain       Value: NO WBC SEEN     NO ORGANISMS SEEN     Performed at [facility]   Culture       Value: NO GROWTH 3 DAYS     Performed at [facility]   Report Status 12/18/2013 FINAL    FUNGUS CULTURE W SMEAR     Status: None   Collection Time    12/15/13 10:47 AM       Result Value Ref Range   Specimen Description TISSUE LUNG LEFT     Special Requests LLL LESION PT ON ANCEF     Fungal Smear       Value: NO YEAST OR FUNGAL ELEMENTS SEEN     Performed at [facility]   Culture       Value: No Fungi Isolated in 4 Weeks     Performed at [facility]   Report Status 01/12/2014 FINAL    POCT I-STAT 3, BLOOD GAS (G3+)     Status: Abnormal   Collection Time    12/16/13  4:07 AM  Result Value Ref Range   pH, Arterial 7.363  7.350 - 7.450   pCO2 arterial 47.6 (*) 35.0 - 45.0 mmHg   pO2, Arterial 71.0 (*) 80.0 - 100.0 mmHg   Bicarbonate 27.1 (*) 20.0 - 24.0 mEq/L   TCO2 29  0 - 100 mmol/L   O2 Saturation 93.0     Acid-Base Excess 1.0  0.0 - 2.0 mmol/L   Patient temperature 98.1 F     Sample type ARTERIAL    CBC     Status: Abnormal   Collection Time    12/16/13  4:09 AM      Result Value Ref Range   WBC 11.8 (*) 4.0 - 10.5 K/uL   RBC 3.79 (*) 3.87 - 5.11 MIL/uL   Hemoglobin 11.1 (*) 12.0 - 15.0 g/dL   HCT 33.6 (*) 36.0 - 46.0 %   MCV 88.7  78.0 - 100.0 fL   MCH 29.3  26.0 - 34.0 pg   MCHC 33.0  30.0 - 36.0 g/dL   RDW 15.0  11.5 - 15.5 %   Platelets 234  150 - 400 K/uL  BASIC METABOLIC PANEL     Status: Abnormal   Collection Time    12/16/13  4:09 AM      Result Value Ref Range   Sodium 141  137 - 147 mEq/L   Potassium 4.4  3.7 - 5.3 mEq/L   Chloride 104  96 - 112 mEq/L   CO2 25  19 - 32 mEq/L   Glucose, Bld 123 (*) 70 - 99 mg/dL   BUN 29 (*) 6 - 23 mg/dL   Creatinine, Ser 1.04  0.50 - 1.10 mg/dL   Calcium 8.1 (*) 8.4 - 10.5 mg/dL   GFR calc non Af Amer 51 (*) >90 mL/min   GFR calc Af Amer 59 (*) >90 mL/min   Comment: (NOTE)     The eGFR has been calculated using the CKD EPI equation.     This calculation has not been validated in all clinical situations.     eGFR's persistently <90 mL/min signify possible Chronic Kidney     Disease.  CBC     Status: Abnormal   Collection Time    12/17/13  4:10 AM      Result Value  Ref Range   WBC 10.7 (*) 4.0 - 10.5 K/uL   RBC 3.58 (*) 3.87 - 5.11 MIL/uL   Hemoglobin 10.3 (*) 12.0 - 15.0 g/dL   HCT 32.1 (*) 36.0 - 46.0 %   MCV 89.7  78.0 - 100.0 fL   MCH 28.8  26.0 - 34.0 pg   MCHC 32.1  30.0 - 36.0 g/dL   RDW 15.4  11.5 - 15.5 %   Platelets 211  150 - 400 K/uL  COMPREHENSIVE METABOLIC PANEL     Status: Abnormal   Collection Time    12/17/13  4:10 AM      Result Value Ref Range   Sodium 135 (*) 137 - 147 mEq/L   Potassium 4.0  3.7 - 5.3 mEq/L   Chloride 99  96 - 112 mEq/L   CO2 24  19 - 32 mEq/L   Glucose, Bld 103 (*) 70 - 99 mg/dL   BUN 29 (*) 6 - 23 mg/dL   Creatinine, Ser 1.21 (*) 0.50 - 1.10 mg/dL   Calcium 8.1 (*) 8.4 - 10.5 mg/dL   Total Protein 5.9 (*) 6.0 - 8.3 g/dL   Albumin 2.7 (*) 3.5 - 5.2 g/dL  AST 22  0 - 37 U/L   ALT 17  0 - 35 U/L   Alkaline Phosphatase 67  39 - 117 U/L   Total Bilirubin 0.7  0.3 - 1.2 mg/dL   GFR calc non Af Amer 43 (*) >90 mL/min   GFR calc Af Amer 49 (*) >90 mL/min   Comment: (NOTE)     The eGFR has been calculated using the CKD EPI equation.     This calculation has not been validated in all clinical situations.     eGFR's persistently <90 mL/min signify possible Chronic Kidney     Disease.  WOUND CULTURE     Status: None   Collection Time    12/28/13  3:30 PM      Result Value Ref Range   Culture Few STAPHYLOCOCCUS AUREUS     Gram Stain Moderate     Gram Stain WBC present-predominately PMN     Gram Stain No Squamous Epithelial Cells Seen     Gram Stain Rare GRAM POSITIVE COCCI IN CLUSTERS     Organism ID, Bacteria STAPHYLOCOCCUS AUREUS     Comment: Rifampin and Gentamicin should not be used as     single drugs for treatment of Staph infections.  BASIC METABOLIC PANEL     Status: Abnormal   Collection Time    01/18/14 12:04 PM      Result Value Ref Range   Sodium 137  135 - 145 mEq/L   Potassium 4.3  3.5 - 5.1 mEq/L   Chloride 102  96 - 112 mEq/L   CO2 23  19 - 32 mEq/L   Glucose, Bld 109 (*) 70 - 99  mg/dL   BUN 47 (*) 6 - 23 mg/dL   Creatinine, Ser 2.0 (*) 0.4 - 1.2 mg/dL   Calcium 8.5  8.4 - 10.5 mg/dL   GFR 25.51 (*) >60.00 mL/min  CBC WITH DIFFERENTIAL     Status: Abnormal   Collection Time    01/18/14 12:04 PM      Result Value Ref Range   WBC 11.2 (*) 4.5 - 10.5 K/uL   RBC 3.70 (*) 3.87 - 5.11 Mil/uL   Hemoglobin 10.1 (*) 12.0 - 15.0 g/dL   HCT 30.7 (*) 36.0 - 46.0 %   MCV 83.1  78.0 - 100.0 fl   MCHC 33.0  30.0 - 36.0 g/dL   RDW 16.3 (*) 11.5 - 14.6 %   Platelets 351.0  150.0 - 400.0 K/uL   Neutrophils Relative % 74.2  43.0 - 77.0 %   Lymphocytes Relative 15.2  12.0 - 46.0 %   Monocytes Relative 6.4  3.0 - 12.0 %   Eosinophils Relative 3.9  0.0 - 5.0 %   Basophils Relative 0.3  0.0 - 3.0 %   Neutro Abs 8.3 (*) 1.4 - 7.7 K/uL   Lymphs Abs 1.7  0.7 - 4.0 K/uL   Monocytes Absolute 0.7  0.1 - 1.0 K/uL   Eosinophils Absolute 0.4  0.0 - 0.7 K/uL   Basophils Absolute 0.0  0.0 - 0.1 K/uL  BRAIN NATRIURETIC PEPTIDE     Status: Abnormal   Collection Time    01/18/14 12:04 PM      Result Value Ref Range   Pro B Natriuretic peptide (BNP) 371.0 (*) 0.0 - 100.0 pg/mL  PRO B NATRIURETIC PEPTIDE     Status: Abnormal   Collection Time    01/24/14  1:34 PM      Result Value Ref Range  Pro B Natriuretic peptide (BNP) 5088.00 (*) <126 pg/mL  BASIC METABOLIC PANEL     Status: Abnormal   Collection Time    01/24/14  1:34 PM      Result Value Ref Range   Sodium 139  135 - 145 mEq/L   Potassium 4.6  3.5 - 5.3 mEq/L   Chloride 102  96 - 112 mEq/L   CO2 26  19 - 32 mEq/L   Glucose, Bld 93  70 - 99 mg/dL   BUN 49 (*) 6 - 23 mg/dL   Creat 2.41 (*) 0.50 - 1.10 mg/dL   Calcium 8.7  8.4 - 10.5 mg/dL  PRO B NATRIURETIC PEPTIDE     Status: Abnormal   Collection Time    01/25/14  1:14 PM      Result Value Ref Range   Pro B Natriuretic peptide (BNP) 3400.00 (*) <126 pg/mL  BASIC METABOLIC PANEL     Status: Abnormal   Collection Time    01/25/14  1:14 PM      Result Value Ref Range    Sodium 141  135 - 145 mEq/L   Potassium 5.1  3.5 - 5.3 mEq/L   Chloride 105  96 - 112 mEq/L   CO2 26  19 - 32 mEq/L   Glucose, Bld 131 (*) 70 - 99 mg/dL   BUN 49 (*) 6 - 23 mg/dL   Creat 2.46 (*) 0.50 - 1.10 mg/dL   Calcium 8.3 (*) 8.4 - 10.5 mg/dL  BASIC METABOLIC PANEL     Status: Abnormal   Collection Time    02/03/14  8:39 AM      Result Value Ref Range   Sodium 138  135 - 145 mEq/L   Potassium 3.6  3.5 - 5.1 mEq/L   Chloride 104  96 - 112 mEq/L   CO2 23  19 - 32 mEq/L   Glucose, Bld 101 (*) 70 - 99 mg/dL   BUN 44 (*) 6 - 23 mg/dL   Creatinine, Ser 2.6 (*) 0.4 - 1.2 mg/dL   Calcium 8.3 (*) 8.4 - 10.5 mg/dL   GFR 19.32 (*) >60.00 mL/min  BRAIN NATRIURETIC PEPTIDE     Status: Abnormal   Collection Time    02/14/14 10:30 AM      Result Value Ref Range   Brain Natriuretic Peptide 271.8 (*) 0.0 - 100.0 pg/mL  COMPREHENSIVE METABOLIC PANEL     Status: Abnormal   Collection Time    02/14/14 10:30 AM      Result Value Ref Range   Sodium 140  135 - 145 mEq/L   Potassium 4.6  3.5 - 5.3 mEq/L   Chloride 105  96 - 112 mEq/L   CO2 24  19 - 32 mEq/L   Glucose, Bld 79  70 - 99 mg/dL   BUN 42 (*) 6 - 23 mg/dL   Creat 2.34 (*) 0.50 - 1.10 mg/dL   Total Bilirubin 0.8  0.2 - 1.2 mg/dL   Alkaline Phosphatase 72  39 - 117 U/L   AST 13  0 - 37 U/L   ALT 8  0 - 35 U/L   Total Protein 5.8 (*) 6.0 - 8.3 g/dL   Albumin 2.8 (*) 3.5 - 5.2 g/dL   Calcium 8.4  8.4 - 10.5 mg/dL  PROTEIN, URINE, 24 HOUR     Status: Abnormal   Collection Time    02/14/14 10:37 AM      Result Value Ref Range  Protein, Urine 502     Comment: Result confirmed by automatic dilution.   Protein, 24H Urine 120 (*) 50 - 100 mg/day  APTT     Status: None   Collection Time    02/25/14  8:16 AM      Result Value Ref Range   aPTT 30  24 - 37 seconds  CBC     Status: Abnormal   Collection Time    02/25/14  8:16 AM      Result Value Ref Range   WBC 7.9  4.0 - 10.5 K/uL   RBC 4.15  3.87 - 5.11 MIL/uL    Hemoglobin 11.6 (*) 12.0 - 15.0 g/dL   HCT 35.9 (*) 36.0 - 46.0 %   MCV 86.5  78.0 - 100.0 fL   MCH 28.0  26.0 - 34.0 pg   MCHC 32.3  30.0 - 36.0 g/dL   RDW 16.5 (*) 11.5 - 15.5 %   Platelets 294  150 - 400 K/uL  PROTIME-INR     Status: None   Collection Time    02/25/14  8:16 AM      Result Value Ref Range   Prothrombin Time 13.1  11.6 - 15.2 seconds   INR 1.01  0.00 - 1.49  CBC     Status: Abnormal   Collection Time    02/25/14  7:56 PM      Result Value Ref Range   WBC 8.0  4.0 - 10.5 K/uL   RBC 3.50 (*) 3.87 - 5.11 MIL/uL   Hemoglobin 9.6 (*) 12.0 - 15.0 g/dL   Comment: DELTA CHECK NOTED     REPEATED TO VERIFY   HCT 30.5 (*) 36.0 - 46.0 %   MCV 87.1  78.0 - 100.0 fL   MCH 27.4  26.0 - 34.0 pg   MCHC 31.5  30.0 - 36.0 g/dL   RDW 16.5 (*) 11.5 - 15.5 %   Platelets 269  150 - 400 K/uL  CBC     Status: Abnormal   Collection Time    02/26/14  7:50 AM      Result Value Ref Range   WBC 6.8  4.0 - 10.5 K/uL   RBC 3.76 (*) 3.87 - 5.11 MIL/uL   Hemoglobin 10.2 (*) 12.0 - 15.0 g/dL   HCT 33.0 (*) 36.0 - 46.0 %   MCV 87.8  78.0 - 100.0 fL   MCH 27.1  26.0 - 34.0 pg   MCHC 30.9  30.0 - 36.0 g/dL   RDW 16.6 (*) 11.5 - 15.5 %   Platelets 288  150 - 400 K/uL   Assessment/Plan: HYPERTENSION Unfortunately patient unable to have ACEI/ARB giving IgA Nephropathy.  Cannot tolerate CCBs due to swelling.  Patient is maxed out on Lasix.  Will increase hydralazine to 100 mg TID. Patient to follow-up with her Cardiologist in 1 week.  Follow-up with Nephrologist as scheduled.  Follow-up with PCP in 1 week if unable to see Cardiologist in timely fashion.  Educated on alarm signs/symptoms and when to proceed to ER if indicated.  Patient and husband voice understanding.

## 2014-03-16 ENCOUNTER — Ambulatory Visit: Payer: Medicare Other | Admitting: Family Medicine

## 2014-03-16 NOTE — Telephone Encounter (Signed)
Pt seen in office

## 2014-03-18 ENCOUNTER — Encounter: Payer: Self-pay | Admitting: Family Medicine

## 2014-03-18 ENCOUNTER — Ambulatory Visit (INDEPENDENT_AMBULATORY_CARE_PROVIDER_SITE_OTHER): Payer: Medicare Other | Admitting: Family Medicine

## 2014-03-18 ENCOUNTER — Telehealth: Payer: Self-pay | Admitting: Family Medicine

## 2014-03-18 ENCOUNTER — Other Ambulatory Visit: Payer: Self-pay | Admitting: General Practice

## 2014-03-18 VITALS — BP 144/78 | HR 52 | Temp 98.4°F | Resp 16 | Wt 241.2 lb

## 2014-03-18 DIAGNOSIS — I2510 Atherosclerotic heart disease of native coronary artery without angina pectoris: Secondary | ICD-10-CM | POA: Diagnosis not present

## 2014-03-18 DIAGNOSIS — E785 Hyperlipidemia, unspecified: Secondary | ICD-10-CM | POA: Diagnosis not present

## 2014-03-18 DIAGNOSIS — I10 Essential (primary) hypertension: Secondary | ICD-10-CM

## 2014-03-18 LAB — LIPID PANEL
Cholesterol: 262 mg/dL — ABNORMAL HIGH (ref 0–200)
HDL: 67.4 mg/dL (ref >39.00)
LDL Cholesterol: 133 mg/dL — ABNORMAL HIGH (ref 0–99)
TRIGLYCERIDES: 307 mg/dL — AB (ref 0.0–149.0)
Total CHOL/HDL Ratio: 4
VLDL: 61.4 mg/dL — AB (ref 0.0–40.0)

## 2014-03-18 LAB — HEPATIC FUNCTION PANEL
ALBUMIN: 2.9 g/dL — AB (ref 3.5–5.2)
ALT: 16 U/L (ref 0–35)
AST: 15 U/L (ref 0–37)
Alkaline Phosphatase: 52 U/L (ref 39–117)
BILIRUBIN TOTAL: 0.7 mg/dL (ref 0.2–1.2)
Bilirubin, Direct: 0 mg/dL (ref 0.0–0.3)
Total Protein: 6.1 g/dL (ref 6.0–8.3)

## 2014-03-18 LAB — BASIC METABOLIC PANEL
BUN: 70 mg/dL — ABNORMAL HIGH (ref 6–23)
CO2: 29 mEq/L (ref 19–32)
Calcium: 8.8 mg/dL (ref 8.4–10.5)
Chloride: 103 mEq/L (ref 96–112)
Creatinine, Ser: 1.8 mg/dL — ABNORMAL HIGH (ref 0.4–1.2)
GFR: 29.31 mL/min — AB (ref >60.00)
Glucose, Bld: 84 mg/dL (ref 70–99)
POTASSIUM: 3 meq/L — AB (ref 3.5–5.1)
SODIUM: 142 meq/L (ref 135–145)

## 2014-03-18 MED ORDER — POTASSIUM CHLORIDE CRYS ER 20 MEQ PO TBCR
20.0000 meq | EXTENDED_RELEASE_TABLET | Freq: Every day | ORAL | Status: DC
Start: 2014-03-18 — End: 2014-05-27

## 2014-03-18 NOTE — Progress Notes (Signed)
Pre visit review using our clinic review tool, if applicable. No additional management support is needed unless otherwise documented below in the visit note. 

## 2014-03-18 NOTE — Telephone Encounter (Signed)
Left message for patient to make her aware of upcoming cardiology appt w/Dr. Fransico Him. Awaiting return call.

## 2014-03-18 NOTE — Progress Notes (Signed)
   Subjective:    Patient ID: Susan Pittman, female    DOB: 06-12-1941, 73 y.o.   MRN: [id number]  HPI HTN- chronic problem.  Has been difficult to control recently.  Saw Cody on 5/11 due to elevated readings.  Hydralazine was increased to 100mg  TID at last visit but pt was not able to tolerate this and decreased it back to 50mg .  Pt having difficulty seeing cardiology (Dr Claiborne Billings)- has had appts cancelled on her, difficult getting appts scheduled in the first place.  Currently on Lasix, Hydralazine, Bystolic.  Unable to take ARB due to IgA nephropathy.  Home readings are very elevated but pt's cuff is very tight on her arm.    Hyperlipidemia- pt was attempting to control w/ healthy diet and regular exercise.  Has not been able to exercise much due to all of her recent health issues.   Review of Systems For ROS see HPI     Objective:   Physical Exam  Constitutional: She is oriented to person, place, and time. She appears well-developed and well-nourished. No distress.  HENT:  Head: Normocephalic and atraumatic.  Eyes: Conjunctivae and EOM are normal. Pupils are equal, round, and reactive to light.  Neck: Normal range of motion. Neck supple. No thyromegaly present.  Cardiovascular: Normal rate, regular rhythm and intact distal pulses.   Murmur heard. Pulmonary/Chest: Effort normal and breath sounds normal. No respiratory distress.  Abdominal: Soft. She exhibits no distension. There is no tenderness.  Musculoskeletal: She exhibits no edema.  Lymphadenopathy:    She has no cervical adenopathy.  Neurological: She is alert and oriented to person, place, and time.  Skin: Skin is warm and dry.  Psychiatric: She has a normal mood and affect. Her behavior is normal.          Assessment & Plan:

## 2014-03-18 NOTE — Patient Instructions (Signed)
Follow up in 4-6 weeks to recheck BP No med changes at this time- it looks good! We'll notify you of your lab results and make any changes if needed I'll work on updating our team roster and let you know! Call with any questions or concerns Happy Friday!!!

## 2014-03-20 NOTE — Assessment & Plan Note (Signed)
Chronic problem.  Has been difficult to control recently.  Suspect that home readings are falsely elevated due to fact that cuff is too small.  BP looks good today.  No med changes.  Will follow closely.  Will call cardiology in regards to switching provider.  Pt appreciative

## 2014-03-20 NOTE — Assessment & Plan Note (Signed)
Chronic problem.  Has been unable to exercise recently.  Check labs.  Start meds prn.

## 2014-03-22 NOTE — Telephone Encounter (Signed)
Patient aware of appt

## 2014-03-24 ENCOUNTER — Encounter: Payer: Self-pay | Admitting: Cardiothoracic Surgery

## 2014-03-24 ENCOUNTER — Ambulatory Visit (INDEPENDENT_AMBULATORY_CARE_PROVIDER_SITE_OTHER): Payer: Medicare Other | Admitting: Cardiothoracic Surgery

## 2014-03-24 ENCOUNTER — Ambulatory Visit
Admission: RE | Admit: 2014-03-24 | Discharge: 2014-03-24 | Disposition: A | Discharge status: Home / Self Care | Payer: Medicare Other | Source: Ambulatory Visit | Attending: Cardiothoracic Surgery | Admitting: Cardiothoracic Surgery

## 2014-03-24 VITALS — BP 145/67 | HR 58 | Resp 20 | Ht 65.0 in | Wt 241.0 lb

## 2014-03-24 DIAGNOSIS — Z9889 Other specified postprocedural states: Secondary | ICD-10-CM | POA: Diagnosis not present

## 2014-03-24 DIAGNOSIS — R222 Localized swelling, mass and lump, trunk: Secondary | ICD-10-CM | POA: Diagnosis not present

## 2014-03-24 DIAGNOSIS — C3430 Malignant neoplasm of lower lobe, unspecified bronchus or lung: Secondary | ICD-10-CM | POA: Diagnosis not present

## 2014-03-24 DIAGNOSIS — Z902 Acquired absence of lung [part of]: Secondary | ICD-10-CM

## 2014-03-24 DIAGNOSIS — I129 Hypertensive chronic kidney disease with stage 1 through stage 4 chronic kidney disease, or unspecified chronic kidney disease: Secondary | ICD-10-CM | POA: Diagnosis not present

## 2014-03-24 DIAGNOSIS — J9819 Other pulmonary collapse: Secondary | ICD-10-CM | POA: Diagnosis not present

## 2014-03-24 DIAGNOSIS — C3490 Malignant neoplasm of unspecified part of unspecified bronchus or lung: Secondary | ICD-10-CM | POA: Diagnosis not present

## 2014-03-24 DIAGNOSIS — J9811 Atelectasis: Secondary | ICD-10-CM

## 2014-03-24 DIAGNOSIS — N059 Unspecified nephritic syndrome with unspecified morphologic changes: Secondary | ICD-10-CM | POA: Diagnosis not present

## 2014-03-24 NOTE — Progress Notes (Signed)
[address]       [place] [address]             [phone]                       Anijah L Jahr [facility] Medical Record #[id number] Date of Birth: [birth year]  Referring GL:OVFI, Christena Deem, MD Primary Cardiology:Dr Gwenlyn Found Primary Care:Katherine [name], MD  Chief Complaint:   PostOp Follow Up Visit 12/15/2013  OPERATIVE REPORT  PREOPERATIVE DIAGNOSIS: Left lower lobe lung mass.  POSTOPERATIVE DIAGNOSIS: Left lower lobe lung mass. Adenocarcinoma by  frozen section.  PROCEDURE PERFORMED: Bronchoscopy, left video-assisted thoracoscopy,  with wedge resection for biopsy of left lower lobe lesion, completion  left lower lobectomy, lymph node dissection, placement of On-Q device.  SURGEON: Lanelle Bal, MD   Diagnosis: GMS stain demonstrates abundant microorganisms present within the hyalinized nodules with morphology consistent with Histoplasmosis capsulatum. 1. Lung, wedge biopsy/resection, Left lower lobe - INVASIVE ADENOCARCINOMA SEE COMMENT. - TUMOR INVOLVES SURGICAL MARGIN. - NO LYMPHOVASCULAR INVASION IDENTIFIED. - SEE TUMOR SYNOPTIC TEMPLATE BELOW. 2. Lymph node, biopsy, 8 node - ONE LYMPH NODE, NEGATIVE FOR TUMOR (0/1), SEE COMMENT. 3. Lymph node, biopsy, 11 L - ONE LYMPH NODE, NEGATIVE FOR TUMOR (0/1). 4. Lung, resection (segmental or lobe), Left lower - BENIGN LUNG, SEE COMMENT. - NEGATIVE FOR ATYPIA OR MALIGNANCY. - BRONCHOVASCULAR MARGIN, NEGATIVE FOR ATYPIA OR MALIGNANCY. 5. Lymph node, biopsy, 10 node - ONE LYMPH NODE, NEGATIVE FOR TUMOR (0/1). 6. Lymph node, biopsy, 12 node - ONE LYMPH NODE, NEGATIVE FOR TUMOR (0/1). Microscopic Comment 1. LUNG 1 of 3 FINAL for JULIANI, LADUKE ([id number]) Microscopic Comment(continued) Specimen, including laterality: Left lower lobe (parts 1 and 4). Procedure: Wedge resection and lobectomy Specimen integrity (intact/disrupted): Intact Tumor site: Subpleural Maximum tumor size (cm):  1.7 cm Histologic type: Adenocarcinoma (acinar and lepidic subtypes) Grade: I/well differentiated Margins: Present at margins, see comment. Distance to closest margin (cm): Present at margin Visceral pleura invasion: Absent Tumor extension: Tumor is confined to subpleural parenchyma Treatment effect (if treated with neoadjuvant therapy): None Lymph -Vascular invasion: Absent Lymph nodes: Number examined - 4; Number N1 nodes positive - 0; Number N2 nodes positive - 0 TNM code: pT1a, pN0, pMX Ancillary studies: Can be performed upon request Non-neoplastic lung: See part 4. Comments: The final surgical margin is part 4. 2. Sections of lymph node demonstrate multiple foci of extensively hyalinized and calcified intranodal nodules. AFB, PAS and GMS stains are pending and will be reported in an addendum. 4. There is no tumor grossly identified. Representative sections of the lung demonstrate non-neoplastic findings to include minimal chronic interstitial inflammation with rare epithelioid granuloma, anthracotic pigment deposition, minimal peribronchiolar chronic inflammation, Langerhans cell histiocytic nodules, and an incidental 3 mm subpleural minute pulmonary meningothelial-like nodule (chemodectoma). The chemodectoma is an incidental, benign finding of no prognostic significance. There are no features of epithelial dysplasia or malignancy present. (CR:kh 12-16-13) Mali RUND DO  History of Present Illness:     Patient returns to the office today after left lower lobectomy for stage I adenocarcinoma the lung. Feels shaky and nervious on steroids. Overall the patient is improving. She started water aerobics last week. She still has trouble sleeping probably made worse with steroid therapy. Her cough has resolved    History  Smoking status  . Never Smoker   Smokeless tobacco  . Never Used  Allergies  Allergen Reactions  . Benazepril Hcl Anaphylaxis and Cough  . Loratadine  Anaphylaxis    anaphylaxis  . Celecoxib     REACTION: aching  . Lisinopril     REACTION: cough  . Allegra [Fexofenadine Hcl]     Severe back pain  . Sulfa Antibiotics Hives    Current Outpatient Prescriptions  Medication Sig Dispense Refill  . acetaminophen (TYLENOL) 500 MG tablet Take 1,000 mg by mouth every 8 (eight) hours as needed for moderate pain.      Marland Kitchen albuterol (PROVENTIL HFA;VENTOLIN HFA) 108 (90 BASE) MCG/ACT inhaler Inhale 1-2 puffs into the lungs every 6 (six) hours as needed for wheezing or shortness of breath.  18 g  1  . Calcium Carb-Cholecalciferol 600-800 MG-UNIT TABS Take 1 tablet by mouth daily.      . cetirizine (ZYRTEC) 10 MG tablet Take 10 mg by mouth at bedtime.       . fluticasone (FLONASE) 50 MCG/ACT nasal spray Place 2 sprays into both nostrils daily as needed for allergies or rhinitis.      . furosemide (LASIX) 80 MG tablet Take 80 mg by mouth 2 (two) times daily.      . hydrALAZINE (APRESOLINE) 50 MG tablet Take 1 tablet (50 mg total) by mouth 3 (three) times daily.  90 tablet  11  . lansoprazole (PREVACID) 15 MG capsule Take 15 mg by mouth daily.        Marland Kitchen levothyroxine (SYNTHROID, LEVOTHROID) 88 MCG tablet Take 88 mcg by mouth daily before breakfast.      . Nebivolol HCl (BYSTOLIC) 20 MG TABS Take 20 mg by mouth every morning.      . potassium chloride SA (K-DUR,KLOR-CON) 20 MEQ tablet Take 1 tablet (20 mEq total) by mouth daily.  30 tablet  3  . predniSONE (DELTASONE) 50 MG tablet Take 100 mg by mouth daily with breakfast.       No current facility-administered medications for this visit.       Physical Exam: BP 145/67  Pulse 58  Resp 20  Ht 5\' 5"  (1.651 m)  Wt 241 lb (109.317 kg)  BMI 40.10 kg/m2  SpO2 98%  General appearance: alert and cooperative Neurologic: intact Heart: regular rate and rhythm, S1, S2 normal, no murmur, click, rub or gallop Lungs: diminished breath sounds LLL Abdomen: soft, non-tender; bowel sounds normal; no masses,   no organomegaly Extremities: extremities normal, atraumatic, no cyanosis , Homans sign is negative, patient has 2+ bilateral pedal edema but improved from previous exam  Wound: The larger incision and anterior chest tube site are well-healed. Chest tube site is now completely healed the other incisions are totally healed without evidence of infection Patient has no cervical or supraclavicular adenopathy  Diagnostic Studies & Laboratory data:         Recent Radiology Findings Dg Chest 2 View  03/24/2014   CLINICAL DATA:  Lung cancer, shortness of breath  EXAM: CHEST  2 VIEW  COMPARISON:  02/17/2014  FINDINGS: Cardiomediastinal silhouette is stable. Stable elevation of the left hemidiaphragm and postsurgical changes. Persistent left base posterior consolidation best seen on lateral view. Right lung is clear. No pulmonary edema.  IMPRESSION: Stable elevation of the left hemidiaphragm and postsurgical changes. Persistent left base posterior consolidation best seen on lateral view. Right lung is clear. No pulmonary edema.   Electronically Signed   By: Lahoma Crocker M.D.   On: 03/24/2014 09:20   US Renal  03/03/2014   CLINICAL DATA:  73 year old with proteinuria and recent right kidney biopsy. The patient is having intermittent hematuria. Evaluate for hematoma.  EXAM: RENAL/URINARY TRACT ULTRASOUND COMPLETE  COMPARISON:  02/25/2014  FINDINGS: Right Kidney:  Length: 10.5 cm. Normal echogenicity in the right kidney. There is a small hypoechoic collection along the lower pole suggestive for a small fluid collection. This collection measures 5 mm in depth. There is no abnormal vascular flow around the lower pole to suggest a large pseudoaneurysm. No evidence for right hydronephrosis.  Left Kidney:  Length: 9.9 cm. Left kidney is difficult to visualize and may be slightly hyperechoic. Negative for hydronephrosis.  Bladder:  Fluid in the urinary bladder.  No evidence for a large hematoma.  IMPRESSION: Small hypoechoic  fluid collection along the right kidney lower pole consistent with a small hematoma formation and recent biopsy. No evidence for a large hematoma. No evidence to suggest a large pseudoaneurysm.  Negative for hydronephrosis.   Electronically Signed   By: Markus Daft M.D.   On: 03/03/2014 10:32   US Biopsy  02/25/2014   CLINICAL DATA:  73 year old with proteinuria and microscopic hematuria.  EXAM: ULTRASOUND GUIDED RIGHT KIDNEY BIOPSY  MEDICATIONS: 2.0 mg IV Versed; 75 mcg IV Fentanyl. A radiology nurse monitored the patient for moderate sedation.  Total Moderate Sedation Time: 15 min  PROCEDURE: The procedure, risks, benefits, and alternatives were explained to the patient. Questions regarding the procedure were encouraged and answered. The patient understands and consents to the procedure.  Both kidneys were evaluated with ultrasound. Patient was in a prone position. The right kidney was selected for biopsy. The right flank was prepped with Betadine and a sterile field was created. Using sterile technique, the skin was anesthetized 1% lidocaine. A 16 gauge core biopsy needle was directed into the lower pole cortex with ultrasound guidance. A total of 2 core biopsies were obtained and samples were placed in saline. Bandage was placed over the puncture site.  COMPLICATIONS: None.  FINDINGS: Biopsy needle was directed into the lower pole on both occasions. No immediate bleeding or hematoma formation.  IMPRESSION: Successful ultrasound-guided core biopsies of the right kidney lower pole.  The patient was placed on both bed rest following the procedure. A few hours after the procedure, the patient had painless hematuria. As result, the patient will be kept for 23 hr observation.   Electronically Signed   By: Markus Daft M.D.   On: 02/25/2014 16:04 Dg Chest 2 View  Ct Chest Wo Contrast  02/17/2014   CLINICAL DATA:  Lung infiltrates. History of lung carcinoma in January 2015 with a left lower lobectomy. Short of  breath.  EXAM: CT CHEST WITHOUT CONTRAST  TECHNIQUE: Multidetector CT imaging of the chest was performed following the standard protocol without IV contrast.  COMPARISON:  Current chest radiograph.  Chest CT, 11/05/2013  FINDINGS: There are small right and minimal left pleural effusions. There is consolidation in coarse reticular opacity in the remaining left upper lobe in the posterior lung base. This may all be atelectasis. Pneumonia should be considered likely in the proper clinical setting.  There are 2 small stable nodular densities in the right lung, most likely benign granulomas, scarring or a combination. Mild stable scarring is noted in the anterior medial right lung base. This subsegmental atelectasis at the posterior right lung base. No pulmonary edema. No discrete left lung nodules are seen. There is volume loss on the left from the left lower lobectomy.  There is new mild mediastinal adenopathy. A 1  cm short axis node lies in the precarinal region where it had measures 6.5 mm. There are several prevascular nodes that are prominent. There is a 7 mm node just superior to the main pulmonary artery. This measured 3 mm previously. There is a 9.4 mm right supraclavicular node which previously measured 1 mm smaller. There is and 6 mm left supraclavicular another which measured 4 mm previously.  Heart is normal in size and configuration.  Limited evaluation of the upper abdomen shows node with a or adrenal masses.  The bony thorax is demineralized. There are degenerative changes along the visualized spine. No osteoblastic or osteolytic lesions.  ri IMPRESSION: 1. There is focal opacity with associated linear and coarse reticular opacity in the posterior lower aspect of the remaining left lung. Although this could be atelectasis, pneumonia is suspected. There is a minimal associated left pleural effusion. 2. There are prominent mediastinal lymph nodes which have increased from the prior study. This could reflect  metastatic disease. The nodes may be reactive to the lower lung zone pneumonia. There are prominent supraclavicular nodes that have increased in size as well. No other evidence suggesting metastatic disease. 3. Smallght effusion.  No evidence of a right lung infiltrate. 4. To small right lung nodules are without significant change in most likely benign.   Electronically Signed   By: Lajean Manes M.D.    Recent Labs: Lab Results  Component Value Date   WBC 6.8 02/26/2014   HGB 10.2* 02/26/2014   HCT 33.0* 02/26/2014   PLT 288 02/26/2014   GLUCOSE 84 03/18/2014   CHOL 262* 03/18/2014   TRIG 307.0* 03/18/2014   HDL 67.40 03/18/2014   LDLDIRECT 126.3 09/10/2012   LDLCALC 133* 03/18/2014   ALT 16 03/18/2014   AST 15 03/18/2014   NA 142 03/18/2014   K 3.0* 03/18/2014   CL 103 03/18/2014   CREATININE 1.8* 03/18/2014   BUN 70* 03/18/2014   CO2 29 03/18/2014   TSH 3.158 10/13/2013   INR 1.01 02/25/2014   BNP have increased since  3/17 371  To 5088 to 3460 CR has increased  To 2.46 now ,  1.8on 2/13  Assessment / Plan:    Decreasing renal function , with increased CR and BNP, being followed by renal service , renal bx done currently on steroids for IGA GLOMERULONEPHRITIS WITH FOCAL MILD ACTIVITY Plan to see her back again in 6 months for followup of her stage I carcinoma of the lung/resected   Grace Isaac MD      [address] [place] [address] Office [phone]   Beeper [phone]

## 2014-03-25 ENCOUNTER — Telehealth: Payer: Self-pay | Admitting: Family Medicine

## 2014-03-25 DIAGNOSIS — T380X5A Adverse effect of glucocorticoids and synthetic analogues, initial encounter: Principal | ICD-10-CM

## 2014-03-25 DIAGNOSIS — E099 Drug or chemical induced diabetes mellitus without complications: Secondary | ICD-10-CM

## 2014-03-25 NOTE — Telephone Encounter (Signed)
Received call from Dr Lorrene Reid (nephrology) indicating that pt's glucose on labs yesterday was 174.  (last week at our OV was WNL).  Is on 100mg  of Prednisone daily which is improving her kidney function but Dr Lorrene Reid doesn't feel that it is the right time to wean meds.  Will need referral to Endo for steroid induced DM and ongoing management until pt able to stop steroids.  Will enter referral

## 2014-04-01 ENCOUNTER — Ambulatory Visit (INDEPENDENT_AMBULATORY_CARE_PROVIDER_SITE_OTHER): Payer: Medicare Other | Admitting: Endocrinology

## 2014-04-01 ENCOUNTER — Encounter: Payer: Self-pay | Admitting: Endocrinology

## 2014-04-01 VITALS — BP 120/70 | HR 62 | Temp 97.6°F | Ht 65.0 in | Wt 236.0 lb

## 2014-04-01 DIAGNOSIS — R739 Hyperglycemia, unspecified: Secondary | ICD-10-CM

## 2014-04-01 DIAGNOSIS — R7309 Other abnormal glucose: Secondary | ICD-10-CM | POA: Diagnosis not present

## 2014-04-01 DIAGNOSIS — I2510 Atherosclerotic heart disease of native coronary artery without angina pectoris: Secondary | ICD-10-CM | POA: Diagnosis not present

## 2014-04-01 LAB — HEMOGLOBIN A1C: HEMOGLOBIN A1C: 6.2 % (ref 4.6–6.5)

## 2014-04-01 MED ORDER — ONETOUCH ULTRA 2 W/DEVICE KIT: 1.0000 | PACK | Freq: Once | Status: DC

## 2014-04-01 MED ORDER — GLUCOSE BLOOD VI STRP
1.0000 | ORAL_STRIP | Freq: Every day | Status: DC
Start: 2014-04-01 — End: 2014-04-18

## 2014-04-01 NOTE — Patient Instructions (Signed)
good diet and exercise habits significanly improve the control of your diabetes.  please let me know if you wish to be referred to a dietician.  high blood sugar is very risky to your health.  you should see an eye doctor and dentist every year.  You are at higher than average risk for pneumonia and hepatitis-B.  You should be vaccinated against both.   controlling your blood pressure and cholesterol drastically reduces the damage diabetes does to your body.  this also applies to quitting smoking.  please discuss these with your doctor.  check your blood sugar once a day.  vary the time of day when you check, between before the 3 meals, and at bedtime.  also check if you have symptoms of your blood sugar being too high or too low.  please keep a record of the readings and bring it to your next appointment here.  You can write it on any piece of paper.  please call us sooner if your blood sugar goes below 70, or if you have a lot of readings over 200.   Please come back for a follow-up appointment in 3 months, as you have a high risk of developing diabetes (even without the prednisone) in the future.

## 2014-04-01 NOTE — Progress Notes (Signed)
Subjective:    Patient ID: Susan Pittman, female    DOB: [birth year], 73 y.o.   MRN: [id number]  HPI Pt was dx'ed with IgA nephropathy in April of 2015, and was rx'ed prednisone.  The tentative plan is to start tapering it in the next few weeks.  Pt has been noted to have glucose as high as 176.  She has moderate tremor throughout the body, and assoc slight numbness of the hands.  She does not currently get chemotx for her lung cancer.  Hx is obtained from pt and husband.   Past Medical History  Diagnosis Date  . Asthma   . Hypertension   . GERD (gastroesophageal reflux disease)   . Allergy   . Hypothyroid   . Osteoporosis   . Gastric ulcer   . Hiatal hernia   . Hyperlipidemia     "don't take RX for it" (11/08/2013)  . Renal artery stenosis   . Lung mass     superior segment LLL/notes 11/08/2013  . Pneumonia     "I've had it ?3 times" (11/08/2013)  . Arthritis     "knees, hips, back, hands" (11/08/2013)  . DJD (degenerative joint disease)   . Lung nodule/ adenoca > LLobectomy 12/15/13  11/08/2013    PET 11/23/2013 1. Dominant sub solid left lower lobe nodule does not demonstrate significantly increased metabolic activity. However, morphologically, adenocarcinoma is still a concern.   - Spirometry 11/24/13 wnl  - 11/24/2013 T surg eval rec> LLower lobectomy 12/15/2013 for adenoca   . CAD (coronary artery disease), non obstructive by cardiac cath 12/12/13 01/24/2014  . Basal cell carcinoma     "cut them off face & right shoulder" (11/08/2013)    Past Surgical History  Procedure Laterality Date  . Knee arthroscopy Bilateral   . Anterior cervical decomp/discectomy fusion  2000    C5-C6  . Renal artery stent Right 11/08/2013    [facility] 11/08/2013  . Tonsillectomy and adenoidectomy  ?1946  . Cholecystectomy  1970's  . Vaginal hysterectomy  1970  . Dilation and curettage of uterus  1960's  . Skin cancer excision    . Coronary angiogram      Hx: of 2/ 2015  . Cardiac catheterization      Hx: of 2/  2015  . Video bronchoscopy N/A 12/15/2013    Procedure: VIDEO BRONCHOSCOPY;  Surgeon: Grace Isaac, MD;  Location: [facility] OR;  Service: Thoracic;  Laterality: N/A;  . Video assisted thoracoscopy (vats)/wedge resection Left 12/15/2013    Procedure: VIDEO ASSISTED THORACOSCOPY (VATS)/WEDGE RESECTION;  Surgeon: Grace Isaac, MD;  Location: [facility];  Service: Thoracic;  Laterality: Left;  . Lobectomy Left 12/15/2013    Procedure: LOBECTOMY;  Surgeon: Grace Isaac, MD;  Location: [facility];  Service: Thoracic;  Laterality: Left;    History   Social History  . Marital Status: Married    Spouse Name: N/A    Number of Children: N/A  . Years of Education: N/A   Occupational History  . Retired    Social History Main Topics  . Smoking status: Never Smoker   . Smokeless tobacco: Never Used  . Alcohol Use: No  . Drug Use: No  . Sexual Activity: Yes   Other Topics Concern  . Not on file   Social History Narrative  . No narrative on file    Current Outpatient Prescriptions on File Prior to Visit  Medication Sig Dispense Refill  . acetaminophen (TYLENOL) 500 MG tablet Take  1,000 mg by mouth every 8 (eight) hours as needed for moderate pain.      Marland Kitchen albuterol (PROVENTIL HFA;VENTOLIN HFA) 108 (90 BASE) MCG/ACT inhaler Inhale 1-2 puffs into the lungs every 6 (six) hours as needed for wheezing or shortness of breath.  18 g  1  . Calcium Carb-Cholecalciferol 600-800 MG-UNIT TABS Take 1 tablet by mouth daily.      . cetirizine (ZYRTEC) 10 MG tablet Take 10 mg by mouth at bedtime.       . fluticasone (FLONASE) 50 MCG/ACT nasal spray Place 2 sprays into both nostrils daily as needed for allergies or rhinitis.      . furosemide (LASIX) 80 MG tablet Take 80 mg by mouth 2 (two) times daily.      . hydrALAZINE (APRESOLINE) 50 MG tablet Take 1 tablet (50 mg total) by mouth 3 (three) times daily.  90 tablet  11  . lansoprazole (PREVACID) 15 MG capsule Take 15 mg by mouth daily.        Marland Kitchen levothyroxine  (SYNTHROID, LEVOTHROID) 88 MCG tablet Take 88 mcg by mouth daily before breakfast.      . Nebivolol HCl (BYSTOLIC) 20 MG TABS Take 20 mg by mouth every morning.      . potassium chloride SA (K-DUR,KLOR-CON) 20 MEQ tablet Take 1 tablet (20 mEq total) by mouth daily.  30 tablet  3  . predniSONE (DELTASONE) 50 MG tablet Take 100 mg by mouth daily with breakfast.       No current facility-administered medications on file prior to visit.    Allergies  Allergen Reactions  . Benazepril Hcl Anaphylaxis and Cough  . Loratadine Anaphylaxis    anaphylaxis  . Celecoxib     REACTION: aching  . Lisinopril     REACTION: cough  . Allegra [Fexofenadine Hcl]     Severe back pain  . Sulfa Antibiotics Hives    Family History  Problem Relation Age of Onset  . Breast cancer Sister   . Emphysema Father     smoked  . Allergies Sister   . Allergies Brother   . Heart disease Mother   . Heart disease Father   . Diabetes Father   . Diabetes Brother     BP 120/70  Pulse 62  Temp(Src) 97.6 F (36.4 C) (Oral)  Ht 5\' 5"  (1.651 m)  Wt 236 lb (107.049 kg)  BMI 39.27 kg/m2  SpO2 95%  Review of Systems denies blurry vision, headache, chest pain, n/v, urinary frequency, muscle cramps, excessive diaphoresis, memory loss, depression, cold intolerance, and rhinorrhea.  She has lost 15 lbs.  Sob is less now.  She has easy bruising.      Objective:   Physical Exam VS: see vs page GEN: no distress HEAD: head: no deformity eyes: no periorbital swelling, no proptosis external nose and ears are normal mouth: no lesion seen NECK: supple, thyroid is not enlarged CHEST WALL: no deformity LUNGS:  Clear to auscultation.   CV: reg rate and rhythm, no murmur ABD: abdomen is soft, nontender.  no hepatosplenomegaly.  not distended.  no hernia MUSCULOSKELETAL: muscle bulk and strength are grossly normal.  no obvious joint swelling.  gait is normal and steady EXTEMITIES: no deformity.  no ulcer on the feet.   feet are of normal color and temp.  no edema PULSES: dorsalis pedis intact bilat.  no carotid bruit NEURO:  cn 2-12 grossly intact.   readily moves all 4's.  sensation is intact to touch on  the feet SKIN:  Normal texture and temperature.  No rash or suspicious lesion is visible.  Few ecchymoses on the forearms NODES:  None palpable at the neck PSYCH: alert, well-oriented.  Does not appear anxious nor depressed.   Lab Results  Component Value Date   CREATININE 1.8* 03/18/2014   BUN 70* 03/18/2014   NA 142 03/18/2014   K 3.0* 03/18/2014   CL 103 03/18/2014   CO2 29 03/18/2014   Lab Results  Component Value Date   HGBA1C 6.2 04/01/2014  i have reviewed the records in epic from other provider(s).   i have also reviewed radiol and pathol reports: her long-term prognosis is good.      Assessment & Plan:  Hyperglycemia: new.  Due to steroids. IgA nephropathy: pt says she'll soon be able to taper prednisone, so this argues against medication now.   However, she is at high risk for development of DM.  Renal insufficiency: this limits rx options for DM.  This will have to be considered if she needs medication.   Patient Instructions  good diet and exercise habits significanly improve the control of your diabetes.  please let me know if you wish to be referred to a dietician.  high blood sugar is very risky to your health.  you should see an eye doctor and dentist every year.  You are at higher than average risk for pneumonia and hepatitis-B.  You should be vaccinated against both.   controlling your blood pressure and cholesterol drastically reduces the damage diabetes does to your body.  this also applies to quitting smoking.  please discuss these with your doctor.  check your blood sugar once a day.  vary the time of day when you check, between before the 3 meals, and at bedtime.  also check if you have symptoms of your blood sugar being too high or too low.  please keep a record of the readings and  bring it to your next appointment here.  You can write it on any piece of paper.  please call us sooner if your blood sugar goes below 70, or if you have a lot of readings over 200.   Please come back for a follow-up appointment in 3 months, as you have a high risk of developing diabetes (even without the prednisone) in the future.

## 2014-04-08 DIAGNOSIS — N059 Unspecified nephritic syndrome with unspecified morphologic changes: Secondary | ICD-10-CM | POA: Diagnosis not present

## 2014-04-12 ENCOUNTER — Telehealth: Payer: Self-pay

## 2014-04-12 ENCOUNTER — Encounter (HOSPITAL_BASED_OUTPATIENT_CLINIC_OR_DEPARTMENT_OTHER): Payer: Self-pay | Admitting: Emergency Medicine

## 2014-04-12 ENCOUNTER — Emergency Department (HOSPITAL_BASED_OUTPATIENT_CLINIC_OR_DEPARTMENT_OTHER)
Admission: EM | Admit: 2014-04-12 | Discharge: 2014-04-12 | Disposition: A | Discharge status: Home / Self Care | Payer: Medicare Other | Attending: Emergency Medicine | Admitting: Emergency Medicine

## 2014-04-12 ENCOUNTER — Telehealth: Payer: Self-pay | Admitting: *Deleted

## 2014-04-12 ENCOUNTER — Telehealth: Payer: Self-pay | Admitting: Cardiovascular Disease

## 2014-04-12 ENCOUNTER — Ambulatory Visit (INDEPENDENT_AMBULATORY_CARE_PROVIDER_SITE_OTHER): Payer: Medicare Other | Admitting: Family Medicine

## 2014-04-12 ENCOUNTER — Emergency Department (HOSPITAL_BASED_OUTPATIENT_CLINIC_OR_DEPARTMENT_OTHER): Payer: Medicare Other

## 2014-04-12 ENCOUNTER — Encounter: Payer: Self-pay | Admitting: Family Medicine

## 2014-04-12 VITALS — BP 142/70 | HR 72 | Temp 98.0°F | Wt 235.0 lb

## 2014-04-12 DIAGNOSIS — Z8701 Personal history of pneumonia (recurrent): Secondary | ICD-10-CM | POA: Insufficient documentation

## 2014-04-12 DIAGNOSIS — Z85118 Personal history of other malignant neoplasm of bronchus and lung: Secondary | ICD-10-CM | POA: Diagnosis not present

## 2014-04-12 DIAGNOSIS — I10 Essential (primary) hypertension: Secondary | ICD-10-CM | POA: Insufficient documentation

## 2014-04-12 DIAGNOSIS — K219 Gastro-esophageal reflux disease without esophagitis: Secondary | ICD-10-CM | POA: Insufficient documentation

## 2014-04-12 DIAGNOSIS — E039 Hypothyroidism, unspecified: Secondary | ICD-10-CM | POA: Diagnosis not present

## 2014-04-12 DIAGNOSIS — Z792 Long term (current) use of antibiotics: Secondary | ICD-10-CM | POA: Diagnosis not present

## 2014-04-12 DIAGNOSIS — E669 Obesity, unspecified: Secondary | ICD-10-CM | POA: Insufficient documentation

## 2014-04-12 DIAGNOSIS — J45901 Unspecified asthma with (acute) exacerbation: Secondary | ICD-10-CM | POA: Diagnosis not present

## 2014-04-12 DIAGNOSIS — R062 Wheezing: Secondary | ICD-10-CM

## 2014-04-12 DIAGNOSIS — Z85828 Personal history of other malignant neoplasm of skin: Secondary | ICD-10-CM | POA: Insufficient documentation

## 2014-04-12 DIAGNOSIS — E785 Hyperlipidemia, unspecified: Secondary | ICD-10-CM | POA: Diagnosis not present

## 2014-04-12 DIAGNOSIS — IMO0002 Reserved for concepts with insufficient information to code with codable children: Secondary | ICD-10-CM | POA: Insufficient documentation

## 2014-04-12 DIAGNOSIS — N184 Chronic kidney disease, stage 4 (severe): Secondary | ICD-10-CM

## 2014-04-12 DIAGNOSIS — M1990 Unspecified osteoarthritis, unspecified site: Secondary | ICD-10-CM | POA: Diagnosis not present

## 2014-04-12 DIAGNOSIS — J209 Acute bronchitis, unspecified: Secondary | ICD-10-CM | POA: Diagnosis not present

## 2014-04-12 DIAGNOSIS — I2510 Atherosclerotic heart disease of native coronary artery without angina pectoris: Secondary | ICD-10-CM | POA: Diagnosis not present

## 2014-04-12 DIAGNOSIS — J984 Other disorders of lung: Secondary | ICD-10-CM | POA: Diagnosis not present

## 2014-04-12 DIAGNOSIS — R0602 Shortness of breath: Secondary | ICD-10-CM | POA: Diagnosis not present

## 2014-04-12 LAB — COMPREHENSIVE METABOLIC PANEL
ALBUMIN: 3 g/dL — AB (ref 3.5–5.2)
ALT: 23 U/L (ref 0–35)
AST: 19 U/L (ref 0–37)
Alkaline Phosphatase: 57 U/L (ref 39–117)
BILIRUBIN TOTAL: 0.3 mg/dL (ref 0.3–1.2)
BUN: 81 mg/dL — AB (ref 6–23)
CHLORIDE: 99 meq/L (ref 96–112)
CO2: 24 mEq/L (ref 19–32)
CREATININE: 2 mg/dL — AB (ref 0.50–1.10)
Calcium: 9 mg/dL (ref 8.4–10.5)
GFR calc Af Amer: 27 mL/min — ABNORMAL LOW (ref >90)
GFR calc non Af Amer: 23 mL/min — ABNORMAL LOW (ref >90)
Glucose, Bld: 175 mg/dL — ABNORMAL HIGH (ref 70–99)
Potassium: 4.1 mEq/L (ref 3.7–5.3)
Sodium: 141 mEq/L (ref 137–147)
TOTAL PROTEIN: 5.8 g/dL — AB (ref 6.0–8.3)

## 2014-04-12 LAB — CBC WITH DIFFERENTIAL/PLATELET
BASOS ABS: 0 10*3/uL (ref 0.0–0.1)
BASOS PCT: 0 % (ref 0–1)
Basophils Absolute: 0 10*3/uL (ref 0.0–0.1)
Basophils Relative: 0 % (ref 0.0–3.0)
EOS PCT: 0 % (ref 0.0–5.0)
Eosinophils Absolute: 0 10*3/uL (ref 0.0–0.7)
Eosinophils Absolute: 0 10*3/uL (ref 0.0–0.7)
Eosinophils Relative: 0 % (ref 0–5)
HCT: 35.5 % — ABNORMAL LOW (ref 36.0–46.0)
HEMATOCRIT: 35.2 % — AB (ref 36.0–46.0)
HEMOGLOBIN: 11.9 g/dL — AB (ref 12.0–15.0)
Hemoglobin: 11.9 g/dL — ABNORMAL LOW (ref 12.0–15.0)
Lymphocytes Relative: 8 % — ABNORMAL LOW (ref 12.0–46.0)
Lymphocytes Relative: 4 % — ABNORMAL LOW (ref 12–46)
Lymphs Abs: 1 10*3/uL (ref 0.7–4.0)
Lymphs Abs: 0.4 10*3/uL — ABNORMAL LOW (ref 0.7–4.0)
MCH: 29.1 pg (ref 26.0–34.0)
MCHC: 33.5 g/dL (ref 30.0–36.0)
MCHC: 33.8 g/dL (ref 30.0–36.0)
MCV: 84.6 fl (ref 78.0–100.0)
MCV: 86.1 fL (ref 78.0–100.0)
MONO ABS: 0.1 10*3/uL (ref 0.1–1.0)
MONOS PCT: 0.3 % — AB (ref 3.0–12.0)
MONOS PCT: 1 % — AB (ref 3–12)
Monocytes Absolute: 0 10*3/uL — ABNORMAL LOW (ref 0.1–1.0)
NEUTROS ABS: 11.1 10*3/uL — AB (ref 1.4–7.7)
NEUTROS ABS: 9.1 10*3/uL — AB (ref 1.7–7.7)
NEUTROS PCT: 94 % — AB (ref 43–77)
Platelets: 250 10*3/uL (ref 150.0–400.0)
Platelets: 215 10*3/uL (ref 150–400)
RBC: 4.2 Mil/uL (ref 3.87–5.11)
RBC: 4.09 MIL/uL (ref 3.87–5.11)
RDW: 17 % — ABNORMAL HIGH (ref 11.5–15.5)
RDW: 16.9 % — AB (ref 11.5–15.5)
WBC: 12.1 10*3/uL — AB (ref 4.0–10.5)
WBC: 9.7 10*3/uL (ref 4.0–10.5)

## 2014-04-12 LAB — BASIC METABOLIC PANEL
BUN: 75 mg/dL — ABNORMAL HIGH (ref 6–23)
CALCIUM: 8.9 mg/dL (ref 8.4–10.5)
CO2: 25 meq/L (ref 19–32)
Chloride: 98 mEq/L (ref 96–112)
Creatinine, Ser: 2.1 mg/dL — ABNORMAL HIGH (ref 0.4–1.2)
GFR: 24.51 mL/min — AB (ref >60.00)
Glucose, Bld: 184 mg/dL — ABNORMAL HIGH (ref 70–99)
Potassium: 4.8 mEq/L (ref 3.5–5.1)
SODIUM: 139 meq/L (ref 135–145)

## 2014-04-12 LAB — HEPATIC FUNCTION PANEL
ALBUMIN: 3 g/dL — AB (ref 3.5–5.2)
ALK PHOS: 50 U/L (ref 39–117)
ALT: 26 U/L (ref 0–35)
AST: 28 U/L (ref 0–37)
BILIRUBIN DIRECT: 0 mg/dL (ref 0.0–0.3)
TOTAL PROTEIN: 5.7 g/dL — AB (ref 6.0–8.3)
Total Bilirubin: 0.6 mg/dL (ref 0.2–1.2)

## 2014-04-12 LAB — PRO B NATRIURETIC PEPTIDE: Pro B Natriuretic peptide (BNP): 2946 pg/mL — ABNORMAL HIGH (ref 0–450)

## 2014-04-12 LAB — TROPONIN I

## 2014-04-12 MED ORDER — ALBUTEROL SULFATE (2.5 MG/3ML) 0.083% IN NEBU
2.5000 mg | INHALATION_SOLUTION | Freq: Once | RESPIRATORY_TRACT | Status: AC
  Administered 2014-04-12: 2.5 mg via RESPIRATORY_TRACT

## 2014-04-12 MED ORDER — LEVOFLOXACIN 500 MG PO TABS: 500.0000 mg | ORAL_TABLET | Freq: Every day | ORAL | Status: DC

## 2014-04-12 MED ORDER — AZITHROMYCIN 250 MG PO TABS: ORAL_TABLET | ORAL | Status: DC

## 2014-04-12 MED ORDER — MOMETASONE FURO-FORMOTEROL FUM 100-5 MCG/ACT IN AERO: INHALATION_SPRAY | RESPIRATORY_TRACT | Status: DC

## 2014-04-12 MED ORDER — GUAIFENESIN-CODEINE 100-10 MG/5ML PO SYRP: ORAL_SOLUTION | ORAL | Status: DC

## 2014-04-12 NOTE — ED Provider Notes (Signed)
CSN: [id number]     Arrival date & time 04/12/14  1420 History   First MD Initiated Contact with Patient 04/12/14 1455     Chief Complaint  Patient presents with  . Shortness of Breath     (Consider location/radiation/quality/duration/timing/severity/associated sxs/prior Treatment) HPI Comments: Patient is a 73 year old female with history of lung cancer status post lobectomy several months ago.  She presents with a three day history of chest congestion and cough. She denies any fevers or chills. She denies any chest pain. She was seen by her primary doctor and sent here for further evaluation.  Patient is a 73 y.o. female presenting with shortness of breath. The history is provided by the patient.  Shortness of Breath Severity:  Moderate Onset quality:  Gradual Duration:  3 days Timing:  Constant Progression:  Worsening Chronicity:  New Context: activity   Relieved by:  Nothing Worsened by:  Nothing tried Ineffective treatments:  None tried Associated symptoms: cough     Past Medical History  Diagnosis Date  . Asthma   . Hypertension   . GERD (gastroesophageal reflux disease)   . Allergy   . Hypothyroid   . Osteoporosis   . Gastric ulcer   . Hiatal hernia   . Hyperlipidemia     "don't take RX for it" (11/08/2013)  . Renal artery stenosis   . Lung mass     superior segment LLL/notes 11/08/2013  . Pneumonia     "I've had it ?3 times" (11/08/2013)  . Arthritis     "knees, hips, back, hands" (11/08/2013)  . DJD (degenerative joint disease)   . Lung nodule/ adenoca > LLobectomy 12/15/13  11/08/2013    PET 11/23/2013 1. Dominant sub solid left lower lobe nodule does not demonstrate significantly increased metabolic activity. However, morphologically, adenocarcinoma is still a concern.   - Spirometry 11/24/13 wnl  - 11/24/2013 T surg eval rec> LLower lobectomy 12/15/2013 for adenoca   . CAD (coronary artery disease), non obstructive by cardiac cath 12/12/13 01/24/2014  . Basal cell carcinoma      "cut them off face & right shoulder" (11/08/2013)   Past Surgical History  Procedure Laterality Date  . Knee arthroscopy Bilateral   . Anterior cervical decomp/discectomy fusion  2000    C5-C6  . Renal artery stent Right 11/08/2013    Archie Endo 11/08/2013  . Tonsillectomy and adenoidectomy  ?1946  . Cholecystectomy  1970's  . Vaginal hysterectomy  1970  . Dilation and curettage of uterus  1960's  . Skin cancer excision    . Coronary angiogram      Hx: of 2/ 2015  . Cardiac catheterization      Hx: of 2/ 2015  . Video bronchoscopy N/A 12/15/2013    Procedure: VIDEO BRONCHOSCOPY;  Surgeon: Grace Isaac, MD;  Location: [facility] OR;  Service: Thoracic;  Laterality: N/A;  . Video assisted thoracoscopy (vats)/wedge resection Left 12/15/2013    Procedure: VIDEO ASSISTED THORACOSCOPY (VATS)/WEDGE RESECTION;  Surgeon: Grace Isaac, MD;  Location: [place];  Service: Thoracic;  Laterality: Left;  . Lobectomy Left 12/15/2013    Procedure: LOBECTOMY;  Surgeon: Grace Isaac, MD;  Location: [facility] OR;  Service: Thoracic;  Laterality: Left;   Family History  Problem Relation Age of Onset  . Breast cancer Sister   . Emphysema Father     smoked  . Allergies Sister   . Allergies Brother   . Heart disease Mother   . Heart disease Father   .  Diabetes Father   . Diabetes Brother    History  Substance Use Topics  . Smoking status: Never Smoker   . Smokeless tobacco: Never Used  . Alcohol Use: No   OB History   Grav Para Term Preterm Abortions TAB SAB Ect Mult Living                 Review of Systems  Respiratory: Positive for cough and shortness of breath.   All other systems reviewed and are negative.     Allergies  Benazepril hcl; Loratadine; Celecoxib; Lisinopril; Allegra; and Sulfa antibiotics  Home Medications   Prior to Admission medications   Medication Sig Start Date End Date Taking? Authorizing Provider  acetaminophen (TYLENOL) 500 MG tablet Take 1,000 mg by mouth every 8  (eight) hours as needed for moderate pain.    Historical Provider, MD  albuterol (PROVENTIL HFA;VENTOLIN HFA) 108 (90 BASE) MCG/ACT inhaler Inhale 1-2 puffs into the lungs every 6 (six) hours as needed for wheezing or shortness of breath. 12/31/13   Grace Isaac, MD  Blood Glucose Monitoring Suppl (ONE TOUCH ULTRA 2) W/DEVICE KIT 1 Device by Does not apply route once. 04/01/14   Renato Shin, MD  Calcium Carb-Cholecalciferol 600-800 MG-UNIT TABS Take 1 tablet by mouth daily.    Historical Provider, MD  cetirizine (ZYRTEC) 10 MG tablet Take 10 mg by mouth at bedtime.     Historical Provider, MD  fluticasone (FLONASE) 50 MCG/ACT nasal spray Place 2 sprays into both nostrils daily as needed for allergies or rhinitis.    Historical Provider, MD  furosemide (LASIX) 80 MG tablet Take 80 mg by mouth 2 (two) times daily.    Historical Provider, MD  glucose blood (ONE TOUCH ULTRA TEST) test strip 1 each by Other route daily. 04/01/14   Renato Shin, MD  guaiFENesin-codeine [facility]) 100-10 MG/5ML syrup 1-2 tsp po qhs prn cough 04/12/14   Rosalita Chessman, DO  hydrALAZINE (APRESOLINE) 50 MG tablet Take 1 tablet (50 mg total) by mouth 3 (three) times daily. 03/03/14   Troy Sine, MD  lansoprazole (PREVACID) 15 MG capsule Take 15 mg by mouth daily.      Historical Provider, MD  levofloxacin (LEVAQUIN) 500 MG tablet Take 1 tablet (500 mg total) by mouth daily. 04/12/14   Rosalita Chessman, DO  levothyroxine (SYNTHROID, LEVOTHROID) 88 MCG tablet Take 88 mcg by mouth daily before breakfast.    Historical Provider, MD  mometasone-formoterol [facility]) 100-5 MCG/ACT AERO 2 puffs bid 04/12/14   Rosalita Chessman, DO  Nebivolol HCl (BYSTOLIC) 20 MG TABS Take 20 mg by mouth every morning.    Historical Provider, MD  potassium chloride SA (K-DUR,KLOR-CON) 20 MEQ tablet Take 1 tablet (20 mEq total) by mouth daily. 03/18/14   [name], MD  predniSONE (DELTASONE) 50 MG tablet Take 100 mg by mouth daily with breakfast.     Historical Provider, MD  valsartan (DIOVAN) 80 MG tablet  03/25/14   Historical Provider, MD   BP 149/64  Pulse 63  Temp(Src) 98 F (36.7 C) (Oral)  Resp 20  Ht 5' 5"  (1.651 m)  Wt 235 lb (106.595 kg)  BMI 39.11 kg/m2  SpO2 100% Physical Exam  Nursing note and vitals reviewed. Constitutional: She is oriented to person, place, and time. She appears well-developed and well-nourished. No distress.  HENT:  Head: Normocephalic and atraumatic.  Neck: Normal range of motion. Neck supple.  Cardiovascular: Normal rate and regular rhythm.  Exam  reveals no gallop and no friction rub.   No murmur heard. Pulmonary/Chest: Effort normal and breath sounds normal. No respiratory distress. She has no wheezes.  Abdominal: Soft. Bowel sounds are normal. She exhibits no distension. There is no tenderness.  Musculoskeletal: Normal range of motion. She exhibits edema.  There is 2-3+ edema of the bilateral lower extremities.  Neurological: She is alert and oriented to person, place, and time.  Skin: Skin is warm and dry. She is not diaphoretic.    ED Course  Procedures (including critical care time) Labs Review Labs Reviewed  CBC WITH DIFFERENTIAL  COMPREHENSIVE METABOLIC PANEL  TROPONIN I  PRO B NATRIURETIC PEPTIDE    Imaging Review No results found.   EKG Interpretation   Date/Time:  Tuesday April 12 2014 14:45:42 EDT Ventricular Rate:  63 PR Interval:  116 QRS Duration: 90 QT Interval:  404 QTC Calculation: 413 R Axis:   70 Text Interpretation:  Normal sinus rhythm Normal ECG Confirmed by DELOS   MD, Julianne Chamberlin ([id number]) on 04/12/2014 3:12:57 PM      MDM   Final diagnoses:  None    Patient presents here with complaints of shortness of breath and productive cough for the past several days. She has a history of lung cancer that was treated surgically. Workup here revealed no hypoxia or tachycardia. Her blood work is remarkable for renal insufficiency which is not far from her  baseline. She also has an elevated BNP which has been consistent with prior studies. There is no evidence for heart failure on the chest x-ray. The x-ray does show what I suspect is scarring from her prior surgery. She has had several CT scans in the past and I will leave it up to her pulmonologist to determine whether and when another CT scan is indicated. I am also hesitant to order a CT scan due to her renal insufficiency and the need for contrast to perform this study. She appears well, with oxygen saturations of 100%, heart rate of 60, and no tachypnea. I highly doubt pulmonary embolism. She will be treated with an antibiotic for suspected bronchitis and advised to followup with her pulmonologist.    Veryl Speak, MD 04/12/14 [phone]

## 2014-04-12 NOTE — Telephone Encounter (Signed)
Spoke with patient who states that the phlegm started over the weekend. She feels as if she is wheezing when she lies down at night. No complaint of wheezing while sitting, standing or walking. No complaint of SOB or chest pain. States that she does feel dizzy at times but has not fallen. Has a history of lung cancer and is on 100 mg prednisone maintenance daily. Patient will have someone bring her to the appt today.

## 2014-04-12 NOTE — Telephone Encounter (Signed)
Received prior authorization request from [employer] for [facility]. Necessary form faxed to [place]. Awaiting determination. [id number]//CMA

## 2014-04-12 NOTE — Progress Notes (Signed)
Subjective:     Susan Pittman is a 73 y.o. female here for evaluation of a cough. Onset of symptoms was 3 days ago. Symptoms have been gradually worsening since that time. The cough is productive and is aggravated by pollens and reclining position. Associated symptoms include: shortness of breath, sputum production and wheezing. Patient does have a history of asthma. Patient does have a history of environmental allergens. Patient has not traveled recently. Patient does not have a history of smoking. Patient has not had a previous chest x-ray. Patient has not had a PPD done.  The following portions of the patient's history were reviewed and updated as appropriate:  She  has a past medical history of Asthma; Hypertension; GERD (gastroesophageal reflux disease); Allergy; Hypothyroid; Osteoporosis; Gastric ulcer; Hiatal hernia; Hyperlipidemia; Renal artery stenosis; Lung mass; Pneumonia; Arthritis; DJD (degenerative joint disease); Lung nodule/ adenoca > LLobectomy 12/15/13  (11/08/2013); CAD (coronary artery disease), non obstructive by cardiac cath 12/12/13 (01/24/2014); and Basal cell carcinoma. She  does not have any pertinent problems on file. She  has past surgical history that includes Knee arthroscopy (Bilateral); Anterior cervical decomp/discectomy fusion (2000); Renal artery stent (Right, 11/08/2013); Tonsillectomy and adenoidectomy (?1946); Cholecystectomy (1970's); Vaginal hysterectomy (1970); Dilation and curettage of uterus (1960's); Skin cancer excision; Coronary angiogram; Cardiac catheterization; Video bronchoscopy (N/A, 12/15/2013); Video assisted thoracoscopy (vats)/wedge resection (Left, 12/15/2013); and Lobectomy (Left, 12/15/2013). Her family history includes Allergies in her brother and sister; Breast cancer in her sister; Diabetes in her brother and father; Emphysema in her father; Heart disease in her father and mother. She  reports that she has never smoked. She has never used smokeless  tobacco. She reports that she does not drink alcohol or use illicit drugs. She has a current medication list which includes the following prescription(s): acetaminophen, albuterol, one touch ultra 2, calcium carb-cholecalciferol, cetirizine, fluticasone, furosemide, glucose blood, hydralazine, lansoprazole, levothyroxine, nebivolol hcl, potassium chloride sa, prednisone, and valsartan. Current Outpatient Prescriptions on File Prior to Visit  Medication Sig Dispense Refill  . acetaminophen (TYLENOL) 500 MG tablet Take 1,000 mg by mouth every 8 (eight) hours as needed for moderate pain.      Marland Kitchen albuterol (PROVENTIL HFA;VENTOLIN HFA) 108 (90 BASE) MCG/ACT inhaler Inhale 1-2 puffs into the lungs every 6 (six) hours as needed for wheezing or shortness of breath.  18 g  1  . Blood Glucose Monitoring Suppl (ONE TOUCH ULTRA 2) W/DEVICE KIT 1 Device by Does not apply route once.  1 each  0  . Calcium Carb-Cholecalciferol 600-800 MG-UNIT TABS Take 1 tablet by mouth daily.      . cetirizine (ZYRTEC) 10 MG tablet Take 10 mg by mouth at bedtime.       . fluticasone (FLONASE) 50 MCG/ACT nasal spray Place 2 sprays into both nostrils daily as needed for allergies or rhinitis.      . furosemide (LASIX) 80 MG tablet Take 80 mg by mouth 2 (two) times daily.      Marland Kitchen glucose blood (ONE TOUCH ULTRA TEST) test strip 1 each by Other route daily.  50 each  12  . hydrALAZINE (APRESOLINE) 50 MG tablet Take 1 tablet (50 mg total) by mouth 3 (three) times daily.  90 tablet  11  . lansoprazole (PREVACID) 15 MG capsule Take 15 mg by mouth daily.        Marland Kitchen levothyroxine (SYNTHROID, LEVOTHROID) 88 MCG tablet Take 88 mcg by mouth daily before breakfast.      . Nebivolol HCl (BYSTOLIC) 20  MG TABS Take 20 mg by mouth every morning.      . potassium chloride SA (K-DUR,KLOR-CON) 20 MEQ tablet Take 1 tablet (20 mEq total) by mouth daily.  30 tablet  3  . predniSONE (DELTASONE) 50 MG tablet Take 100 mg by mouth daily with breakfast.      .  valsartan (DIOVAN) 80 MG tablet        No current facility-administered medications on file prior to visit.   She is allergic to benazepril hcl; loratadine; celecoxib; lisinopril; allegra; and sulfa antibiotics..  Review of Systems Pertinent items are noted in HPI.    Objective:    Oxygen saturation 98% on room air BP 142/70  Pulse 72  Temp(Src) 98 F (36.7 C) (Oral)  Wt 235 lb (106.595 kg)  SpO2 98% General appearance: alert, cooperative, appears stated age and no distress Ears: normal TM's and external ear canals both ears Nose: Nares normal. Septum midline. Mucosa normal. No drainage or sinus tenderness. Throat: lips, mucosa, and tongue normal; teeth and gums normal Neck: no adenopathy, supple, symmetrical, trachea midline and thyroid not enlarged, symmetric, no tenderness/mass/nodules Lungs: diminished breath sounds bilaterally Heart: S1, S2 normal    Assessment:    Acute Bronchitis -- r/o pneumonia   Plan:    Antibiotics per medication orders. Antitussives per medication orders. Avoid exposure to tobacco smoke and fumes. B-agonist inhaler. Call if shortness of breath worsens, blood in sputum, change in character of cough, development of fever or chills, inability to maintain nutrition and hydration. Avoid exposure to tobacco smoke and fumes. Chest x-ray. Pulmonary consult. Steroid inhaler as ordered.   1. Wheezing  - albuterol (PROVENTIL) (2.5 MG/3ML) 0.083% nebulizer solution 2.5 mg; Take 3 mLs (2.5 mg total) by nebulization once.  2. Acute bronchitis  - levofloxacin (LEVAQUIN) 500 MG tablet; Take 1 tablet (500 mg total) by mouth daily.  Dispense: 10 tablet; Refill: 0 - mometasone-formoterol (DULERA) 100-5 MCG/ACT AERO; 2 puffs bid  Dispense: 1 Inhaler; Refill: 2 - guaiFENesin-codeine (CHERATUSSIN AC) 100-10 MG/5ML syrup; 1-2 tsp po qhs prn cough  Dispense: 120 mL; Refill: 0  3. Chronic kidney disease (CKD), stage IV (severe) Check labs--- fax to Dr Lorrene Reid  when they come back - Basic metabolic panel - CBC with Differential - Hepatic function panel

## 2014-04-12 NOTE — Progress Notes (Signed)
Pre visit review using our clinic review tool, if applicable. No additional management support is needed unless otherwise documented below in the visit note. 

## 2014-04-12 NOTE — Discharge Instructions (Signed)
Zithromax as prescribed.  Return to the emergency department if you develop chest pain, worsening breathing, high fever, or any other new and concerning symptoms.  Follow up with her primary doctor in the next week.   Bronchitis Bronchitis is inflammation of the airways that extend from the windpipe into the lungs (bronchi). The inflammation often causes mucus to develop, which leads to a cough. If the inflammation becomes severe, it may cause shortness of breath. CAUSES  Bronchitis may be caused by:   Viral infections.   Bacteria.   Cigarette smoke.   Allergens, pollutants, and other irritants.  SIGNS AND SYMPTOMS  The most common symptom of bronchitis is a frequent cough that produces mucus. Other symptoms include:  Fever.   Body aches.   Chest congestion.   Chills.   Shortness of breath.   Sore throat.  DIAGNOSIS  Bronchitis is usually diagnosed through a medical history and physical exam. Tests, such as chest X-rays, are sometimes done to rule out other conditions.  TREATMENT  You may need to avoid contact with whatever caused the problem (smoking, for example). Medicines are sometimes needed. These may include:  Antibiotics. These may be prescribed if the condition is caused by bacteria.  Cough suppressants. These may be prescribed for relief of cough symptoms.   Inhaled medicines. These may be prescribed to help open your airways and make it easier for you to breathe.   Steroid medicines. These may be prescribed for those with recurrent (chronic) bronchitis. HOME CARE INSTRUCTIONS  Get plenty of rest.   Drink enough fluids to keep your urine clear or pale yellow (unless you have a medical condition that requires fluid restriction). Increasing fluids may help thin your secretions and will prevent dehydration.   Only take over-the-counter or prescription medicines as directed by your health care provider.  Only take antibiotics as directed. Make  sure you finish them even if you start to feel better.  Avoid secondhand smoke, irritating chemicals, and strong fumes. These will make bronchitis worse. If you are a smoker, quit smoking. Consider using nicotine gum or skin patches to help control withdrawal symptoms. Quitting smoking will help your lungs heal faster.   Put a cool-mist humidifier in your bedroom at night to moisten the air. This may help loosen mucus. Change the water in the humidifier daily. You can also run the hot water in your shower and sit in the bathroom with the door closed for 5 10 minutes.   Follow up with your health care provider as directed.   Wash your hands frequently to avoid catching bronchitis again or spreading an infection to others.  SEEK MEDICAL CARE IF: Your symptoms do not improve after 1 week of treatment.  SEEK IMMEDIATE MEDICAL CARE IF:  Your fever increases.  You have chills.   You have chest pain.   You have worsening shortness of breath.   You have bloody sputum.  You faint.  You have lightheadedness.  You have a severe headache.   You vomit repeatedly. MAKE SURE YOU:   Understand these instructions.  Will watch your condition.  Will get help right away if you are not doing well or get worse. Document Released: 10/21/2005 Document Revised: 08/11/2013 Document Reviewed: 06/15/2013 [facility] Patient Information 2014 [place].

## 2014-04-12 NOTE — Progress Notes (Signed)
   Subjective:    Patient ID: Susan Pittman, female    DOB: 05-24-1941, 73 y.o.   MRN: [id number]  HPI    Review of Systems     Objective:   Physical Exam        Assessment & Plan:    As pt was getting ready to leave she became increasing sob and dizzy.   Pt was sent to ER for further evaluation

## 2014-04-12 NOTE — ED Notes (Signed)
Pt. Reports she was seen at the Dr. s office today and had been coughing phlegm lite green in color.  {Pt reports pneumonia in march was treated in Dr. s office.   Pt. Here today with same symptoms as she had in march and feels more shaky.  Pt. Reports she has high blood sugar due to 100mg  of Prednisone.  Pt. Also reports she takes 120mg  of lasix daily.

## 2014-04-12 NOTE — ED Notes (Signed)
Sob off and on for a month. Was seen by Dr Ivy Lynn this am and sent here.

## 2014-04-12 NOTE — Patient Instructions (Signed)
Asthma, Adult Asthma is a recurring condition in which the airways tighten and narrow. Asthma can make it difficult to breathe. It can cause coughing, wheezing, and shortness of breath. Asthma episodes (also called asthma attacks) range from minor to life-threatening. Asthma cannot be cured, but medicines and lifestyle changes can help control it. CAUSES Asthma is believed to be caused by inherited (genetic) and environmental factors, but its exact cause is unknown. Asthma may be triggered by allergens, lung infections, or irritants in the air. Asthma triggers are different for each person. Common triggers include:   Animal dander.  Dust mites.  Cockroaches.  Pollen from trees or grass.  Mold.  Smoke.  Air pollutants such as dust, household cleaners, hair sprays, aerosol sprays, paint fumes, strong chemicals, or strong odors.  Cold air, weather changes, and winds (which increase molds and pollens in the air).  Strong emotional expressions such as crying or laughing hard.  Stress.  Certain medicines (such as aspirin) or types of drugs (such as beta-blockers).  Sulfites in foods and drinks. Foods and drinks that may contain sulfites include dried fruit, potato chips, and sparkling grape juice.  Infections or inflammatory conditions such as the flu, a cold, or an inflammation of the nasal membranes (rhinitis).  Gastroesophageal reflux disease (GERD).  Exercise or strenuous activity. SYMPTOMS Symptoms may occur immediately after asthma is triggered or many hours later. Symptoms include:  Wheezing.  Excessive nighttime or early morning coughing.  Frequent or severe coughing with a common cold.  Chest tightness.  Shortness of breath. DIAGNOSIS  The diagnosis of asthma is made by a review of your medical history and a physical exam. Tests may also be performed. These may include:  Lung function studies. These tests show how much air you breath in and out.  Allergy  tests.  Imaging tests such as X-rays. TREATMENT  Asthma cannot be cured, but it can usually be controlled. Treatment involves identifying and avoiding your asthma triggers. It also involves medicines. There are 2 classes of medicine used for asthma treatment:   Controller medicines. These prevent asthma symptoms from occurring. They are usually taken every day.  Reliever or rescue medicines. These quickly relieve asthma symptoms. They are used as needed and provide short-term relief. Your health care provider will help you create an asthma action plan. An asthma action plan is a written plan for managing and treating your asthma attacks. It includes a list of your asthma triggers and how they may be avoided. It also includes information on when medicines should be taken and when their dosage should be changed. An action plan may also involve the use of a device called a peak flow meter. A peak flow meter measures how well the lungs are working. It helps you monitor your condition. HOME CARE INSTRUCTIONS   Take medicine as directed by your health care provider. Speak with your health care provider if you have questions about how or when to take the medicines.  Use a peak flow meter as directed by your health care provider. Record and keep track of readings.  Understand and use the action plan to help minimize or stop an asthma attack without needing to seek medical care.  Control your home environment in the following ways to help prevent asthma attacks:  Do not smoke. Avoid being exposed to secondhand smoke.  Change your heating and air conditioning filter regularly.  Limit your use of fireplaces and wood stoves.  Get rid of pests (such as roaches and   mice) and their droppings.  Throw away plants if you see mold on them.  Clean your floors and dust regularly. Use unscented cleaning products.  Try to have someone else vacuum for you regularly. Stay out of rooms while they are being  vacuumed and for a short while afterward. If you vacuum, use a dust mask from a hardware store, a double-layered or microfilter vacuum cleaner bag, or a vacuum cleaner with a HEPA filter.  Replace carpet with wood, tile, or vinyl flooring. Carpet can trap dander and dust.  Use allergy-proof pillows, mattress covers, and box spring covers.  Wash bed sheets and blankets every week in hot water and dry them in a dryer.  Use blankets that are made of polyester or cotton.  Clean bathrooms and kitchens with bleach. If possible, have someone repaint the walls in these rooms with mold-resistant paint. Keep out of the rooms that are being cleaned and painted.  Wash hands frequently. SEEK MEDICAL CARE IF:   You have wheezing, shortness of breath, or a cough even if taking medicine to prevent attacks.  The colored mucus you cough up (sputum) is thicker than usual.  Your sputum changes from clear or white to yellow, green, gray, or bloody.  You have any problems that may be related to the medicines you are taking (such as a rash, itching, swelling, or trouble breathing).  You are using a reliever medicine more than 2 3 times per week.  Your peak flow is still at 50 79% of you personal best after following your action plan for 1 hour. SEEK IMMEDIATE MEDICAL CARE IF:   You seem to be getting worse and are unresponsive to treatment during an asthma attack.  You are short of breath even at rest.  You get short of breath when doing very little physical activity.  You have difficulty eating, drinking, or talking due to asthma symptoms.  You develop chest pain.  You develop a fast heartbeat.  You have a bluish color to your lips or fingernails.  You are lightheaded, dizzy, or faint.  Your peak flow is less than 50% of your personal best.  You have a fever or persistent symptoms for more than 2 3 days.  You have a fever and symptoms suddenly get worse. MAKE SURE YOU:   Understand these  instructions.  Will watch your condition.  Will get help right away if you are not doing well or get worse. Document Released: 10/21/2005 Document Revised: 06/23/2013 Document Reviewed: 05/20/2013 Exit[employer] Patient Information 2014 [employer].  

## 2014-04-13 ENCOUNTER — Other Ambulatory Visit: Payer: Self-pay | Admitting: Family Medicine

## 2014-04-13 NOTE — Telephone Encounter (Signed)
Med filled.  

## 2014-04-13 NOTE — Telephone Encounter (Signed)
Closed encounter °

## 2014-04-14 NOTE — Telephone Encounter (Signed)
Prior authorization approved through 04/13/2015. Reference # [id number]. Approval letter sent for scanning. JG//CMA

## 2014-04-15 ENCOUNTER — Telehealth: Payer: Self-pay | Admitting: Endocrinology

## 2014-04-15 MED ORDER — ONETOUCH ULTRASOFT LANCETS MISC: Status: DC

## 2014-04-15 NOTE — Telephone Encounter (Signed)
Please call in rx for lancets to [facility] in [place]. Pt has only 3 or 4 left

## 2014-04-15 NOTE — Telephone Encounter (Signed)
Rx faxed

## 2014-04-18 ENCOUNTER — Encounter: Payer: Self-pay | Admitting: Family Medicine

## 2014-04-18 ENCOUNTER — Telehealth: Payer: Self-pay | Admitting: Family Medicine

## 2014-04-18 ENCOUNTER — Ambulatory Visit: Payer: Medicare Other | Admitting: Family Medicine

## 2014-04-18 ENCOUNTER — Ambulatory Visit (INDEPENDENT_AMBULATORY_CARE_PROVIDER_SITE_OTHER): Payer: Medicare Other | Admitting: Family Medicine

## 2014-04-18 VITALS — BP 120/70 | HR 65 | Temp 97.8°F | Resp 17 | Wt 231.4 lb

## 2014-04-18 DIAGNOSIS — I2510 Atherosclerotic heart disease of native coronary artery without angina pectoris: Secondary | ICD-10-CM

## 2014-04-18 DIAGNOSIS — E139 Other specified diabetes mellitus without complications: Secondary | ICD-10-CM

## 2014-04-18 DIAGNOSIS — J209 Acute bronchitis, unspecified: Secondary | ICD-10-CM | POA: Diagnosis not present

## 2014-04-18 DIAGNOSIS — T380X5A Adverse effect of glucocorticoids and synthetic analogues, initial encounter: Secondary | ICD-10-CM

## 2014-04-18 DIAGNOSIS — E099 Drug or chemical induced diabetes mellitus without complications: Secondary | ICD-10-CM

## 2014-04-18 DIAGNOSIS — I10 Essential (primary) hypertension: Secondary | ICD-10-CM | POA: Diagnosis not present

## 2014-04-18 MED ORDER — GLUCOSE BLOOD VI STRP: ORAL_STRIP | Status: DC

## 2014-04-18 NOTE — Telephone Encounter (Signed)
Order sent to pharmacy with dx. Code.

## 2014-04-18 NOTE — Progress Notes (Signed)
Pre visit review using our clinic review tool, if applicable. No additional management support is needed unless otherwise documented below in the visit note. 

## 2014-04-18 NOTE — Patient Instructions (Signed)
Follow up as needed Test 3-4x/day- keep a record of these Fill out the rest of the handicap sticker form and submit Please call and schedule an appt with pulmonary to reassess Call with any questions or concerns Hang in there!

## 2014-04-18 NOTE — Progress Notes (Signed)
   Subjective:    Patient ID: Susan Pittman, female    DOB: 1940/12/01, 73 y.o.   MRN: [id number]  HPI HTN- chronic problem, BP well controlled today on Lasix, Hydralazine, Bystolic, Diovan.  Denies CP, SOB (other than due to bronchitis), HAs, visual changes, edema above baseline.  Bronchitis- pt was seen in both office and ER on 6/9.  Pt was very SOB.  Had normal CXR in ER.  tx'd w/ Zpack and sent home.  Pt was previously seeing Dr Melvyn Novas- needs f/u appt scheduled.  SOB is improving.  On Dulera, Prednisone.  DM- new dx due to recent high dose steroid use.  Pt has been checking CBGs 4-5x/day due to lability of readings.  Is going to run out of test strips, needs renewal.     Review of Systems For ROS see HPI     Objective:   Physical Exam  Vitals reviewed. Constitutional: She is oriented to person, place, and time. She appears well-developed and well-nourished. No distress.  HENT:  Head: Normocephalic and atraumatic.  Eyes: Conjunctivae and EOM are normal. Pupils are equal, round, and reactive to light.  Neck: Normal range of motion. Neck supple. No thyromegaly present.  Cardiovascular: Normal rate, regular rhythm, normal heart sounds and intact distal pulses.   No murmur heard. Pulmonary/Chest: Effort normal and breath sounds normal. No respiratory distress.  Abdominal: Soft. She exhibits no distension. There is no tenderness.  Musculoskeletal: She exhibits edema (1+ pitting edema of L foot/ankle).  Lymphadenopathy:    She has no cervical adenopathy.  Neurological: She is alert and oriented to person, place, and time.  Skin: Skin is warm and dry.  Psychiatric: She has a normal mood and affect. Her behavior is normal.          Assessment & Plan:

## 2014-04-18 NOTE — Telephone Encounter (Signed)
Caller name: Janeese Mcgloin Relation to pt: patient Call back number: [phone] Pharmacy: [facility]  Reason for call: patient states that her pharmacy needs a diagnosis code for her test strips. Please advise

## 2014-04-19 ENCOUNTER — Other Ambulatory Visit: Payer: Self-pay | Admitting: *Deleted

## 2014-04-19 ENCOUNTER — Encounter: Payer: Self-pay | Admitting: Family Medicine

## 2014-04-19 MED ORDER — ONETOUCH DELICA LANCETS 33G MISC: Status: DC

## 2014-04-19 MED ORDER — ONETOUCH ULTRASOFT LANCETS MISC: Status: AC

## 2014-04-20 ENCOUNTER — Telehealth: Payer: Self-pay | Admitting: Endocrinology

## 2014-04-20 ENCOUNTER — Other Ambulatory Visit: Payer: Self-pay | Admitting: General Practice

## 2014-04-20 ENCOUNTER — Other Ambulatory Visit: Payer: Self-pay | Admitting: *Deleted

## 2014-04-20 ENCOUNTER — Encounter: Payer: Medicare Other | Attending: Endocrinology | Admitting: Nutrition

## 2014-04-20 DIAGNOSIS — Z713 Dietary counseling and surveillance: Secondary | ICD-10-CM | POA: Insufficient documentation

## 2014-04-20 DIAGNOSIS — E139 Other specified diabetes mellitus without complications: Secondary | ICD-10-CM | POA: Insufficient documentation

## 2014-04-20 DIAGNOSIS — T380X5A Adverse effect of glucocorticoids and synthetic analogues, initial encounter: Secondary | ICD-10-CM

## 2014-04-20 DIAGNOSIS — E099 Drug or chemical induced diabetes mellitus without complications: Secondary | ICD-10-CM

## 2014-04-20 MED ORDER — GLUCOSE BLOOD VI STRP: ORAL_STRIP | Status: DC

## 2014-04-20 NOTE — Progress Notes (Signed)
This patient is on 100mg . Of prednisone for asthma and bronchitis.  Since starting this, her blood sugars have gone over 200 in the late afternoons.  She did not bring her meter, but says that her FBS this AM was 121, but 1- 2hr. pcS last night, it was 221.    Her meals are usually balanced, but she is eating between 2 and 4 servings of carbohydrate at each meal, and is having a late afternoon snack of chocolate and ice cream--causing acS readings to go higher.   Discussed the idea of balanced meals and limiting the servings of carbohydrates to no more than 2-3 per meal.  A list of carb servings were given to her.  She reported good understanding of this.  Also suggested that she reduce the ice cream to 1/2 bar in the afternoon, and walk for 30 min. Daily, In the late afternoon, or after supper meal when weather is less hot.   Suggested she test her blood sugars only at 2hr. After the meal--this would allow for her blood sugar to come down, and give [place] a better idea of her body's response to the meal.  Goals for 2hr. pc readings were given to her:  Less than 180.  She reported good understanding of this.

## 2014-04-20 NOTE — Telephone Encounter (Signed)
Susan Pittman is here for blood sugar review.  She was taking Levemir 50u and 40u of Novolin N q AM.  She was starting to have blood sugars in the 60s and 70s and Dr. Loanne Drilling stopped her Levemir on 04/15/14.  Her last 5 FBSs have been:  181, 200, 170, 165, 246, and 239 today  On 04/19/14, her blood sugar dropped to 79,  2 hours after lunch.

## 2014-04-20 NOTE — Telephone Encounter (Signed)
This was written on the wrong patient's record

## 2014-04-20 NOTE — Telephone Encounter (Signed)
Reduce Novolin N by 10 units and start Levemir 10 units at bedtime

## 2014-04-20 NOTE — Telephone Encounter (Signed)
Previous note made in error on the wrong patient, does not apply to the current patient

## 2014-04-21 NOTE — Assessment & Plan Note (Addendum)
Chronic problem, much better control today.  Asymptomatic w/ exception of chronic edema.  No med changes.  Will continue to follow.

## 2014-04-21 NOTE — Assessment & Plan Note (Signed)
Pt's sxs are improved after recent ER visit.  SOB is improving.  Has seen Dr Melvyn Novas previously.  Encouraged her to f/u due to recurrent issues.  Pt in agreement.

## 2014-04-21 NOTE — Assessment & Plan Note (Signed)
New.  Pt is seeing Dr Loanne Drilling.  Reports CBGs are not well controlled and is testing 4-5x/day.  Needs refill on test strips- script sent.  Will follow along and assist as able.

## 2014-04-22 ENCOUNTER — Ambulatory Visit: Payer: Medicare Other | Admitting: Internal Medicine

## 2014-04-22 ENCOUNTER — Encounter: Payer: Self-pay | Admitting: Internal Medicine

## 2014-04-22 VITALS — BP 146/74 | HR 63 | Temp 97.5°F | Ht 65.0 in | Wt 230.0 lb

## 2014-04-22 DIAGNOSIS — R0600 Dyspnea, unspecified: Secondary | ICD-10-CM

## 2014-04-22 DIAGNOSIS — R0602 Shortness of breath: Secondary | ICD-10-CM

## 2014-04-22 MED ORDER — PANTOPRAZOLE SODIUM 40 MG PO TBEC: 40.0000 mg | DELAYED_RELEASE_TABLET | Freq: Every day | ORAL | Status: DC

## 2014-04-22 MED ORDER — FAMOTIDINE 20 MG PO TABS: ORAL_TABLET | ORAL | Status: DC

## 2014-04-22 MED ORDER — ALPRAZOLAM 0.5 MG PO TABS: ORAL_TABLET | ORAL | Status: DC

## 2014-04-22 NOTE — Patient Instructions (Addendum)
Xanax (alprazolam) 0.5 mg at bedtime as needed   Prevacid 15 takex 2 30 min before bfast and supper until you use them up then start Pantoprazole (protonix) 40 mg   Take 30-60 min before first meal of the day and Pepcid 20 mg one bedtime x one month  GERD (REFLUX)  is an extremely common cause of respiratory symptoms, many times with no significant heartburn at all.    It can be treated with medication, but also with lifestyle changes including avoidance of late meals, excessive alcohol, smoking cessation, and avoid fatty foods, chocolate, peppermint, colas, red wine, and acidic juices such as orange juice.  NO MINT OR MENTHOL PRODUCTS SO NO COUGH DROPS  USE SUGARLESS CANDY INSTEAD (jolley ranchers or Stover's)  NO OIL BASED VITAMINS - use powdered substitutes.   Definitely keep appt to see Dr Radford Pax - if condition worsens go to ER in meantime

## 2014-04-22 NOTE — Progress Notes (Addendum)
Subjective:    Patient ID: ZOEJANE GAULIN, female    DOB: Jun 08, 1941 MRN: [id number]   Brief patient profile:  81 yowf never smoker from [place] exposed to lots of cigar/pipe smoke referred 11/24/2013 by Dr Gwenlyn Found for pulmonary eval for spn.  11/24/2013 1st [facility] office visit/ Jelisha Weed cc abn cxr vs 2009 baseline - no symptoms at all, cxr was done for preop purposes  rec T surg eval > LLobectomy for adenoca   Mar 08 2014 started pred for IgA Dr Hardie Lora at 100 mg per day  04/22/2014 f/u ov/Avagail Whittlesey re:  Chief Complaint  Patient presents with  . Follow-up    Pt c/o pressure in lungs, cough, wheezing at times and SOB. Pt concerned d/t h/o lung CA. Using [facility] PRN  Best days can do 70 min water including 2 weeks prior to OV  s inhalers Around June 6th felt shaky, coughing yellow, chest discomfort more sob worse lying back no better with neb in office 6/9 > to ER > Zpak and cough med and better including swimming one day prior to OV  But felt tired p that, shaky again, some cough, somewhat productive with hoarsness, more sob,   recurrent L ant pressure not pleuritic came on  since waking am of ov and has had had it off and on since surgery - no nausea, some sweats, no shaking chills on prevacid 15 mg each am half hour before eat . No better with dulera or saba - no better or worse with meals  No obvious day to day or daytime variabilty or assoc  chest tightness, subjective wheeze overt sinus or hb symptoms. No unusual exp hx or h/o childhood pna/ asthma or knowledge of premature birth.  Sleeping very poorly for weeks due to restlessness/anxiety, not sob or cough, without nocturnal  or early am exacerbation  of respiratory  c/o's or need for noct saba. Also denies any obvious fluctuation of symptoms with weather or environmental changes or other aggravating or alleviating factors except as outlined above   Current Medications, Allergies, Complete Past Medical History, Past Surgical History,  Family History, and Social History were reviewed in [employer] record.  ROS  The following are not active complaints unless bolded sore throat, dysphagia, dental problems, itching, sneezing,  nasal congestion or excess/ purulent secretions, ear ache,   fever, chills, sweats, unintended wt loss, classically  pleuritic or exertional cp, hemoptysis,  orthopnea pnd or leg swelling, presyncope, palpitations, heartburn, abdominal pain, anorexia, nausea, vomiting, diarrhea  or change in bowel or urinary habits, change in stools or urine, dysuria,hematuria,  rash, arthralgias, visual complaints, headache, numbness weakness or ataxia or problems with walking or coordination,  change in mood/affect or memory.                        Objective:   Physical Exam   Chronically ill appearing very hoarse  amb wf nad slt cushingnoid   04/22/2014      230  Wt Readings from Last 3 Encounters:  11/24/13 252 lb (114.306 kg)  11/09/13 244 lb 14.9 oz (111.1 kg)  11/09/13 244 lb 14.9 oz (111.1 kg)      HEENT: nl dentition, turbinates, and orophanx. Nl external ear canals without cough reflex   NECK :  without JVD/Nodes/TM/ nl carotid upstrokes bilaterally   LUNGS: no acc muscle use, slt decreased bs L base without cough on insp or exp maneuvers   CV:  RRR  no s3 or murmur or increase in P2, trace to 1+ pitting bilaterally sym LE edema   ABD:  soft and  Min tender over epigastrium  with nl excursion in the supine position. No bruits or organomegaly, bowel sounds nl  MS:  warm without deformities, calf tenderness, cyanosis or clubbing  SKIN: warm and dry without lesions    NEURO:  alert, approp, no deficits     04/22/14  ekg nl/ t waves slt prominent but no ischemic changes  Labs from er 04/12/14 ok except for elevated BNP/ creat 2.0/ K 4.1      Assessment & Plan:

## 2014-04-23 DIAGNOSIS — R0600 Dyspnea, unspecified: Secondary | ICD-10-CM | POA: Insufficient documentation

## 2014-04-23 NOTE — Assessment & Plan Note (Deleted)
04/22/2014  Walked RA  2 laps @ 185 ft each stopped due to  No desat or obvious sob, just weak.  Pt with variable complaints of sob, cough, non-specific chest and abd complaints for several weeks with labs already performed in ER. Very unlikely PET given walking study today and nonpleuritic features and more likely related to side effects from high doses of prednisone either with gastritis, gerd or anxiety, or vol overload   Therefore rec max gerd / acid suppression and start xanax 0.5 mg q hs prn with low threshold to ER if worsens  Note she does have diastolic chf but already on rx with no orthopnea by hx nor edema or cxr or crackles on exam and plans f/u with Dr Radford Pax w/in a week.

## 2014-04-23 NOTE — Assessment & Plan Note (Addendum)
04/22/2014  Walked RA  2 laps @ 185 ft each stopped due to  No desat or obvious sob, just weak.  Pt with variable complaints of sob, cough, non-specific chest and abd complaints for several weeks with labs already performed in ER. Very unlikely PET given walking study today and nonpleuritic features and more likely related to side effects from high doses of prednisone either with gastritis, gerd or anxiety, or vol overload but clearly this is not allergy or asthma which would be eliminated on 10% of the prednisone she's taking for nephropathy.  Therefore rec max gerd / acid suppression and start xanax 0.5 mg q hs prn with low threshold to ER if worsens  Note she does have diastolic chf but already on rx with no orthopnea by hx nor edema or cxr or crackles on exam and plans f/u with Dr Radford Pax w/in a week.

## 2014-04-27 ENCOUNTER — Encounter: Payer: Medicare Other | Admitting: Cardiology

## 2014-04-27 NOTE — Patient Instructions (Signed)
Limit increase to 1/2 bar in late afternoon Walk for 30-45 min. Once daily Test blood sugars before and 2hr. After lunch or supper meal--alternating the meals each day.

## 2014-04-28 NOTE — Progress Notes (Signed)
This encounter was created in error - please disregard.

## 2014-05-03 ENCOUNTER — Ambulatory Visit (INDEPENDENT_AMBULATORY_CARE_PROVIDER_SITE_OTHER): Payer: Medicare Other | Admitting: Cardiovascular Disease

## 2014-05-03 ENCOUNTER — Encounter: Payer: Self-pay | Admitting: Cardiovascular Disease

## 2014-05-03 VITALS — BP 122/72 | HR 61 | Ht 65.0 in | Wt 225.1 lb

## 2014-05-03 DIAGNOSIS — I10 Essential (primary) hypertension: Secondary | ICD-10-CM | POA: Diagnosis not present

## 2014-05-03 DIAGNOSIS — I5032 Chronic diastolic (congestive) heart failure: Secondary | ICD-10-CM

## 2014-05-03 DIAGNOSIS — I701 Atherosclerosis of renal artery: Secondary | ICD-10-CM

## 2014-05-03 DIAGNOSIS — I2510 Atherosclerotic heart disease of native coronary artery without angina pectoris: Secondary | ICD-10-CM

## 2014-05-03 NOTE — Assessment & Plan Note (Signed)
Blood pressure is well controlled. She had renal artery duplex in January which showed patent stent. I recommend a followup renal artery duplex.

## 2014-05-03 NOTE — Assessment & Plan Note (Signed)
Recent fluid overload was likely due to chronic kidney disease from IgA nephropathy and proteinuria. Lower extremity edema is improving with treatment. Diuretics are managed by nephrology.

## 2014-05-03 NOTE — Patient Instructions (Signed)
Your physician has requested that you have a renal artery duplex. During this test, an ultrasound is used to evaluate blood flow to the kidneys. Allow one hour for this exam. Do not eat after midnight the day before and avoid carbonated beverages. Take your medications as you usually do.   Your physician recommends that you continue on your current medications as directed. Please refer to the Current Medication list given to you today.  Your physician wants you to follow-up in: 6 months with Dr. Fletcher Anon.  You will receive a reminder letter in the mail two months in advance. If you don't receive a letter, please call our office to schedule the follow-up appointment.

## 2014-05-03 NOTE — Progress Notes (Signed)
HPI  [name] is a 73 year old female who requested transfer of cardiovascular care. She was seen by Dr. Claiborne Billings and Dr. Gwenlyn Found.  She has a long-standing history of hypertension for at least 30-40 years. She initially was treated with diuretic therapy.  She has a history of hypothyroidism, remote peptic ulcer disease in the 1980s, as well as GERD.  As part of her workup for hypertension, she was found to have significant renal artery stenosis.  On 11/08/2013 she underwent stenting of her right renal artery by Dr. Gwenlyn Found after a renal Doppler suggested hemodynamically significant right renal artery stenosis.  She subsequently presented to the hospital and on 12/12/2013 underwent cardiac catheterization after presenting with chest pain.  She was found to have mild nonobstructive CAD with smooth 20% RCA narrowing.  Ejection fraction was 65%.   She was found to have lung nodules and on 12/15/2013 underwent VATS and mini thoracotomy with lobectomy of the left lower lobe with lymph node dissection for adenocarcinoma.  She continued to have leg edema and worsening chronic kidney disease. She underwent kidney biopsy which showed evidence of IgA nephropathy. She was started on high-dose prednisone. She reports multiple side effects to prednisone including hyperglycemia, weight gain and anxiety.   Allergies  Allergen Reactions  . Benazepril Hcl Anaphylaxis and Cough  . Loratadine Anaphylaxis    anaphylaxis  . Celecoxib     REACTION: aching  . Lisinopril     REACTION: cough  . Allegra [Fexofenadine Hcl]     Severe back pain  . Sulfa Antibiotics Hives     Current Outpatient Prescriptions on File Prior to Visit  Medication Sig Dispense Refill  . acetaminophen (TYLENOL) 500 MG tablet Take 1,000 mg by mouth every 8 (eight) hours as needed for moderate pain.      Marland Kitchen albuterol (PROVENTIL HFA;VENTOLIN HFA) 108 (90 BASE) MCG/ACT inhaler Inhale 1-2 puffs into the lungs every 6 (six) hours as needed for  wheezing or shortness of breath.  18 g  1  . ALPRAZolam (XANAX) 0.5 MG tablet Take at bedtime as needed for sleep  30 tablet  2  . Blood Glucose Monitoring Suppl (ONE TOUCH ULTRA 2) W/DEVICE KIT 1 Device by Does not apply route once.  1 each  0  . BYSTOLIC 10 MG tablet TAKE TWO TABLETS BY MOUTH ONCE DAILY  60 tablet  3  . Calcium Carb-Cholecalciferol 600-800 MG-UNIT TABS Take 1 tablet by mouth daily.      . cetirizine (ZYRTEC) 10 MG tablet Take 10 mg by mouth at bedtime.       . DULERA 100-5 MCG/ACT AERO Inhale into the lungs as needed.       . famotidine (PEPCID) 20 MG tablet One at bedtime  30 tablet  2  . fluticasone (FLONASE) 50 MCG/ACT nasal spray Place 2 sprays into both nostrils daily as needed for allergies or rhinitis.      . furosemide (LASIX) 80 MG tablet Take 80 mg by mouth 2 (two) times daily.      Marland Kitchen glucose blood (ONE TOUCH ULTRA TEST) test strip Pt to test 3-4x daily due to labile sugars Dx. 250.02  100 each  12  . hydrALAZINE (APRESOLINE) 50 MG tablet Take 1 tablet (50 mg total) by mouth 3 (three) times daily.  90 tablet  11  . Lancets (ONETOUCH ULTRASOFT) lancets Use 1 per day dx code 249.00  100 each  12  . levothyroxine (SYNTHROID, LEVOTHROID) 88 MCG tablet Take 88 mcg  by mouth daily before breakfast.      . ONETOUCH DELICA LANCETS 63F MISC Use to check blood sugar once daily as instructed. Dx code: 790.29  100 each  2  . pantoprazole (PROTONIX) 40 MG tablet Take 1 tablet (40 mg total) by mouth daily. Take 30-60 min before first meal of the day  30 tablet  2  . pantoprazole (PROTONIX) 40 MG tablet       . potassium chloride SA (K-DUR,KLOR-CON) 20 MEQ tablet Take 1 tablet (20 mEq total) by mouth daily.  30 tablet  3  . predniSONE (DELTASONE) 50 MG tablet Take 100 mg by mouth daily with breakfast.      . valsartan (DIOVAN) 80 MG tablet        No current facility-administered medications on file prior to visit.     Past Medical History  Diagnosis Date  . Asthma   .  Hypertension   . GERD (gastroesophageal reflux disease)   . Allergy   . Hypothyroid   . Osteoporosis   . Gastric ulcer   . Hiatal hernia   . Hyperlipidemia     "don't take RX for it" (11/08/2013)  . Renal artery stenosis   . Lung mass     superior segment LLL/notes 11/08/2013  . Pneumonia     "I've had it ?3 times" (11/08/2013)  . Arthritis     "knees, hips, back, hands" (11/08/2013)  . DJD (degenerative joint disease)   . Lung nodule/ adenoca > LLobectomy 12/15/13  11/08/2013    PET 11/23/2013 1. Dominant sub solid left lower lobe nodule does not demonstrate significantly increased metabolic activity. However, morphologically, adenocarcinoma is still a concern.   - Spirometry 11/24/13 wnl  - 11/24/2013 T surg eval rec> LLower lobectomy 12/15/2013 for adenoca   . CAD (coronary artery disease), non obstructive by cardiac cath 12/12/13 01/24/2014  . Basal cell carcinoma     "cut them off face & right shoulder" (11/08/2013)     Past Surgical History  Procedure Laterality Date  . Knee arthroscopy Bilateral   . Anterior cervical decomp/discectomy fusion  2000    C5-C6  . Renal artery stent Right 11/08/2013    Archie Endo 11/08/2013  . Tonsillectomy and adenoidectomy  ?1946  . Cholecystectomy  1970's  . Vaginal hysterectomy  1970  . Dilation and curettage of uterus  1960's  . Skin cancer excision    . Coronary angiogram      Hx: of 2/ 2015  . Cardiac catheterization      Hx: of 2/ 2015  . Video bronchoscopy N/A 12/15/2013    Procedure: VIDEO BRONCHOSCOPY;  Surgeon: Grace Isaac, MD;  Location: [facility] OR;  Service: Thoracic;  Laterality: N/A;  . Video assisted thoracoscopy (vats)/wedge resection Left 12/15/2013    Procedure: VIDEO ASSISTED THORACOSCOPY (VATS)/WEDGE RESECTION;  Surgeon: Grace Isaac, MD;  Location: [facility];  Service: Thoracic;  Laterality: Left;  . Lobectomy Left 12/15/2013    Procedure: LOBECTOMY;  Surgeon: Grace Isaac, MD;  Location: [facility] OR;  Service: Thoracic;  Laterality: Left;      Family History  Problem Relation Age of Onset  . Breast cancer Sister   . Emphysema Father     smoked  . Allergies Sister   . Allergies Brother   . Heart disease Mother   . Heart disease Father   . Diabetes Father   . Diabetes Brother      History   Social History  . Marital Status: Married  Spouse Name: N/A    Number of Children: N/A  . Years of Education: N/A   Occupational History  . Retired    Social History Main Topics  . Smoking status: Never Smoker   . Smokeless tobacco: Never Used  . Alcohol Use: No  . Drug Use: No  . Sexual Activity: Yes   Other Topics Concern  . Not on file   Social History Narrative  . No narrative on file     PHYSICAL EXAM   BP 122/72  Pulse 61  Ht _0  (1.651 m)  Wt 225 lb 1.9 oz (102.114 kg)  BMI 37.46 kg/m2  SpO2 99% Constitutional: She is oriented to person, place, and time. She appears well-developed and well-nourished. No distress.  HENT: No nasal discharge.  Head: Normocephalic and atraumatic.  Eyes: Pupils are equal and round. No discharge.  Neck: Normal range of motion. Neck supple. No JVD present. No thyromegaly present.  Cardiovascular: Normal rate, regular rhythm, normal heart sounds. Exam reveals no gallop and no friction rub. No murmur heard.  Pulmonary/Chest: Effort normal and breath sounds normal. No stridor. No respiratory distress. She has no wheezes. She has no rales. She exhibits no tenderness.  Abdominal: Soft. Bowel sounds are normal. She exhibits no distension. There is no tenderness. There is no rebound and no guarding.  Musculoskeletal: Normal range of motion. She exhibits mild edema and no tenderness.  Neurological: She is alert and oriented to person, place, and time. Coordination normal.  Skin: Skin is warm and dry. No rash noted. She is not diaphoretic. No erythema. No pallor.  Psychiatric: She has a normal mood and affect. Her behavior is normal. Judgment and thought content normal.       ASSESSMENT AND PLAN

## 2014-05-03 NOTE — Assessment & Plan Note (Signed)
Blood pressure is well controlled on current medications. 

## 2014-05-04 ENCOUNTER — Ambulatory Visit: Payer: Medicare Other | Admitting: Cardiovascular Disease

## 2014-05-04 DIAGNOSIS — I129 Hypertensive chronic kidney disease with stage 1 through stage 4 chronic kidney disease, or unspecified chronic kidney disease: Secondary | ICD-10-CM | POA: Diagnosis not present

## 2014-05-04 DIAGNOSIS — E139 Other specified diabetes mellitus without complications: Secondary | ICD-10-CM | POA: Diagnosis not present

## 2014-05-04 DIAGNOSIS — I701 Atherosclerosis of renal artery: Secondary | ICD-10-CM | POA: Diagnosis not present

## 2014-05-04 DIAGNOSIS — N059 Unspecified nephritic syndrome with unspecified morphologic changes: Secondary | ICD-10-CM | POA: Diagnosis not present

## 2014-05-05 ENCOUNTER — Encounter (HOSPITAL_COMMUNITY): Payer: Medicare Other

## 2014-05-05 ENCOUNTER — Ambulatory Visit (HOSPITAL_COMMUNITY): Payer: Medicare Other | Attending: Cardiovascular Disease | Admitting: Cardiology

## 2014-05-05 DIAGNOSIS — I129 Hypertensive chronic kidney disease with stage 1 through stage 4 chronic kidney disease, or unspecified chronic kidney disease: Secondary | ICD-10-CM | POA: Diagnosis not present

## 2014-05-05 DIAGNOSIS — I701 Atherosclerosis of renal artery: Secondary | ICD-10-CM | POA: Diagnosis not present

## 2014-05-05 DIAGNOSIS — I2510 Atherosclerotic heart disease of native coronary artery without angina pectoris: Secondary | ICD-10-CM | POA: Insufficient documentation

## 2014-05-05 DIAGNOSIS — N184 Chronic kidney disease, stage 4 (severe): Secondary | ICD-10-CM | POA: Insufficient documentation

## 2014-05-05 DIAGNOSIS — N189 Chronic kidney disease, unspecified: Secondary | ICD-10-CM | POA: Diagnosis not present

## 2014-05-05 DIAGNOSIS — E785 Hyperlipidemia, unspecified: Secondary | ICD-10-CM | POA: Insufficient documentation

## 2014-05-05 NOTE — Progress Notes (Signed)
Renal artery duplex performed  

## 2014-05-09 DIAGNOSIS — N059 Unspecified nephritic syndrome with unspecified morphologic changes: Secondary | ICD-10-CM | POA: Diagnosis not present

## 2014-05-10 ENCOUNTER — Telehealth: Payer: Self-pay | Admitting: Cardiovascular Disease

## 2014-05-10 NOTE — Telephone Encounter (Signed)
lmtcb Debbie Zitlaly Malson RN  

## 2014-05-10 NOTE — Telephone Encounter (Signed)
Pt is aware of  Renal duplex results Horton Chin RN

## 2014-05-10 NOTE — Telephone Encounter (Signed)
New message          Pt returning debbie's call

## 2014-05-18 DIAGNOSIS — E039 Hypothyroidism, unspecified: Secondary | ICD-10-CM | POA: Diagnosis not present

## 2014-05-18 DIAGNOSIS — I2510 Atherosclerotic heart disease of native coronary artery without angina pectoris: Secondary | ICD-10-CM | POA: Diagnosis not present

## 2014-05-18 DIAGNOSIS — M899 Disorder of bone, unspecified: Secondary | ICD-10-CM | POA: Diagnosis not present

## 2014-05-18 DIAGNOSIS — K219 Gastro-esophageal reflux disease without esophagitis: Secondary | ICD-10-CM | POA: Diagnosis not present

## 2014-05-18 DIAGNOSIS — I509 Heart failure, unspecified: Secondary | ICD-10-CM | POA: Diagnosis not present

## 2014-05-18 DIAGNOSIS — M7989 Other specified soft tissue disorders: Secondary | ICD-10-CM | POA: Diagnosis not present

## 2014-05-18 DIAGNOSIS — J45909 Unspecified asthma, uncomplicated: Secondary | ICD-10-CM | POA: Diagnosis not present

## 2014-05-18 DIAGNOSIS — R609 Edema, unspecified: Secondary | ICD-10-CM | POA: Diagnosis not present

## 2014-05-18 DIAGNOSIS — M949 Disorder of cartilage, unspecified: Secondary | ICD-10-CM | POA: Diagnosis not present

## 2014-05-18 DIAGNOSIS — I10 Essential (primary) hypertension: Secondary | ICD-10-CM | POA: Diagnosis not present

## 2014-05-18 DIAGNOSIS — R0602 Shortness of breath: Secondary | ICD-10-CM | POA: Diagnosis not present

## 2014-05-23 ENCOUNTER — Telehealth: Payer: Self-pay | Admitting: Endocrinology

## 2014-05-23 ENCOUNTER — Telehealth: Payer: Self-pay | Admitting: Internal Medicine

## 2014-05-23 DIAGNOSIS — I129 Hypertensive chronic kidney disease with stage 1 through stage 4 chronic kidney disease, or unspecified chronic kidney disease: Secondary | ICD-10-CM | POA: Diagnosis not present

## 2014-05-23 DIAGNOSIS — I701 Atherosclerosis of renal artery: Secondary | ICD-10-CM | POA: Diagnosis not present

## 2014-05-23 DIAGNOSIS — E139 Other specified diabetes mellitus without complications: Secondary | ICD-10-CM | POA: Diagnosis not present

## 2014-05-23 DIAGNOSIS — N059 Unspecified nephritic syndrome with unspecified morphologic changes: Secondary | ICD-10-CM | POA: Diagnosis not present

## 2014-05-23 MED ORDER — FAMOTIDINE 20 MG PO TABS: 20.0000 mg | ORAL_TABLET | Freq: Two times a day (BID) | ORAL | Status: DC

## 2014-05-23 NOTE — Telephone Encounter (Signed)
Spoke with pt-- c/o xanax causing excessive sleepiness during the daytime.  Pt also states that since starting the Pepcid and Prevacid, she has been having an increased amt of stomach issues--pt reports belching more often.   Pt requesting recs on xanax dosing or alternative medication.  Pt requesting recs for stomach issues as well.   [facility]  Please advise Dr Melvyn Novas. Thanks.

## 2014-05-23 NOTE — Telephone Encounter (Signed)
Pt aware of recs per MW Pepcid 20mg  1 BID sent to [place] per pt request Nothing further needed.

## 2014-05-23 NOTE — Telephone Encounter (Signed)
rec Try off prevacid and rx pepcid 20 mg after bfast and supper Try reduce xanax by one half q 6 h prn and if wants the smaller strength (0.25) so she doesn't need to break them in half then that's fine

## 2014-05-23 NOTE — Telephone Encounter (Signed)
Pt was told that if her blood sugars go above 200 that she needed to call so they can possibly put her on another dose of insulin  Pt at this time takes no insulin but has prednisone induced diabetes

## 2014-05-27 ENCOUNTER — Emergency Department (HOSPITAL_COMMUNITY): Payer: Medicare Other

## 2014-05-27 ENCOUNTER — Encounter (HOSPITAL_COMMUNITY): Payer: Self-pay | Admitting: Emergency Medicine

## 2014-05-27 ENCOUNTER — Inpatient Hospital Stay (HOSPITAL_COMMUNITY)
Admission: EM | Admit: 2014-05-27 | Discharge: 2014-05-30 | DRG: 292 | Disposition: A | Discharge status: Home / Self Care | Payer: Medicare Other | Attending: Internal Medicine | Admitting: Internal Medicine

## 2014-05-27 DIAGNOSIS — R7309 Other abnormal glucose: Secondary | ICD-10-CM | POA: Diagnosis present

## 2014-05-27 DIAGNOSIS — I129 Hypertensive chronic kidney disease with stage 1 through stage 4 chronic kidney disease, or unspecified chronic kidney disease: Secondary | ICD-10-CM | POA: Diagnosis present

## 2014-05-27 DIAGNOSIS — J45909 Unspecified asthma, uncomplicated: Secondary | ICD-10-CM | POA: Diagnosis present

## 2014-05-27 DIAGNOSIS — I491 Atrial premature depolarization: Secondary | ICD-10-CM

## 2014-05-27 DIAGNOSIS — I319 Disease of pericardium, unspecified: Secondary | ICD-10-CM | POA: Diagnosis not present

## 2014-05-27 DIAGNOSIS — N183 Chronic kidney disease, stage 3 unspecified: Secondary | ICD-10-CM | POA: Diagnosis present

## 2014-05-27 DIAGNOSIS — R404 Transient alteration of awareness: Secondary | ICD-10-CM | POA: Diagnosis not present

## 2014-05-27 DIAGNOSIS — I214 Non-ST elevation (NSTEMI) myocardial infarction: Secondary | ICD-10-CM

## 2014-05-27 DIAGNOSIS — K209 Esophagitis, unspecified without bleeding: Secondary | ICD-10-CM

## 2014-05-27 DIAGNOSIS — I951 Orthostatic hypotension: Secondary | ICD-10-CM

## 2014-05-27 DIAGNOSIS — R42 Dizziness and giddiness: Secondary | ICD-10-CM

## 2014-05-27 DIAGNOSIS — E669 Obesity, unspecified: Secondary | ICD-10-CM

## 2014-05-27 DIAGNOSIS — R0609 Other forms of dyspnea: Secondary | ICD-10-CM | POA: Diagnosis present

## 2014-05-27 DIAGNOSIS — Z860100 Personal history of colon polyps, unspecified: Secondary | ICD-10-CM

## 2014-05-27 DIAGNOSIS — I701 Atherosclerosis of renal artery: Secondary | ICD-10-CM | POA: Diagnosis present

## 2014-05-27 DIAGNOSIS — I5032 Chronic diastolic (congestive) heart failure: Secondary | ICD-10-CM | POA: Diagnosis not present

## 2014-05-27 DIAGNOSIS — Z0000 Encounter for general adult medical examination without abnormal findings: Secondary | ICD-10-CM

## 2014-05-27 DIAGNOSIS — E785 Hyperlipidemia, unspecified: Secondary | ICD-10-CM | POA: Diagnosis present

## 2014-05-27 DIAGNOSIS — Z85118 Personal history of other malignant neoplasm of bronchus and lung: Secondary | ICD-10-CM

## 2014-05-27 DIAGNOSIS — T380X5A Adverse effect of glucocorticoids and synthetic analogues, initial encounter: Secondary | ICD-10-CM | POA: Diagnosis present

## 2014-05-27 DIAGNOSIS — I5031 Acute diastolic (congestive) heart failure: Secondary | ICD-10-CM

## 2014-05-27 DIAGNOSIS — N02B9 Other recurrent and persistent immunoglobulin A nephropathy: Secondary | ICD-10-CM | POA: Diagnosis present

## 2014-05-27 DIAGNOSIS — N189 Chronic kidney disease, unspecified: Secondary | ICD-10-CM | POA: Diagnosis not present

## 2014-05-27 DIAGNOSIS — M899 Disorder of bone, unspecified: Secondary | ICD-10-CM

## 2014-05-27 DIAGNOSIS — I5033 Acute on chronic diastolic (congestive) heart failure: Principal | ICD-10-CM | POA: Diagnosis present

## 2014-05-27 DIAGNOSIS — IMO0002 Reserved for concepts with insufficient information to code with codable children: Secondary | ICD-10-CM | POA: Diagnosis not present

## 2014-05-27 DIAGNOSIS — R5381 Other malaise: Secondary | ICD-10-CM | POA: Diagnosis not present

## 2014-05-27 DIAGNOSIS — R079 Chest pain, unspecified: Secondary | ICD-10-CM

## 2014-05-27 DIAGNOSIS — M129 Arthropathy, unspecified: Secondary | ICD-10-CM | POA: Diagnosis present

## 2014-05-27 DIAGNOSIS — Z8249 Family history of ischemic heart disease and other diseases of the circulatory system: Secondary | ICD-10-CM | POA: Diagnosis not present

## 2014-05-27 DIAGNOSIS — I2510 Atherosclerotic heart disease of native coronary artery without angina pectoris: Secondary | ICD-10-CM | POA: Diagnosis present

## 2014-05-27 DIAGNOSIS — N059 Unspecified nephritic syndrome with unspecified morphologic changes: Secondary | ICD-10-CM

## 2014-05-27 DIAGNOSIS — E039 Hypothyroidism, unspecified: Secondary | ICD-10-CM | POA: Diagnosis present

## 2014-05-27 DIAGNOSIS — R609 Edema, unspecified: Secondary | ICD-10-CM

## 2014-05-27 DIAGNOSIS — I509 Heart failure, unspecified: Secondary | ICD-10-CM | POA: Diagnosis present

## 2014-05-27 DIAGNOSIS — R0602 Shortness of breath: Secondary | ICD-10-CM | POA: Diagnosis not present

## 2014-05-27 DIAGNOSIS — M949 Disorder of cartilage, unspecified: Secondary | ICD-10-CM

## 2014-05-27 DIAGNOSIS — Z981 Arthrodesis status: Secondary | ICD-10-CM

## 2014-05-27 DIAGNOSIS — K219 Gastro-esophageal reflux disease without esophagitis: Secondary | ICD-10-CM | POA: Diagnosis present

## 2014-05-27 DIAGNOSIS — I959 Hypotension, unspecified: Secondary | ICD-10-CM | POA: Diagnosis present

## 2014-05-27 DIAGNOSIS — L989 Disorder of the skin and subcutaneous tissue, unspecified: Secondary | ICD-10-CM

## 2014-05-27 DIAGNOSIS — R739 Hyperglycemia, unspecified: Secondary | ICD-10-CM

## 2014-05-27 DIAGNOSIS — R600 Localized edema: Secondary | ICD-10-CM

## 2014-05-27 DIAGNOSIS — R5383 Other fatigue: Secondary | ICD-10-CM | POA: Diagnosis not present

## 2014-05-27 DIAGNOSIS — I10 Essential (primary) hypertension: Secondary | ICD-10-CM | POA: Diagnosis not present

## 2014-05-27 DIAGNOSIS — C4490 Unspecified malignant neoplasm of skin, unspecified: Secondary | ICD-10-CM

## 2014-05-27 DIAGNOSIS — K259 Gastric ulcer, unspecified as acute or chronic, without hemorrhage or perforation: Secondary | ICD-10-CM

## 2014-05-27 DIAGNOSIS — M810 Age-related osteoporosis without current pathological fracture: Secondary | ICD-10-CM | POA: Diagnosis present

## 2014-05-27 DIAGNOSIS — I5189 Other ill-defined heart diseases: Secondary | ICD-10-CM

## 2014-05-27 DIAGNOSIS — E099 Drug or chemical induced diabetes mellitus without complications: Secondary | ICD-10-CM

## 2014-05-27 DIAGNOSIS — R911 Solitary pulmonary nodule: Secondary | ICD-10-CM

## 2014-05-27 DIAGNOSIS — R0600 Dyspnea, unspecified: Secondary | ICD-10-CM

## 2014-05-27 DIAGNOSIS — Z902 Acquired absence of lung [part of]: Secondary | ICD-10-CM

## 2014-05-27 DIAGNOSIS — M1990 Unspecified osteoarthritis, unspecified site: Secondary | ICD-10-CM

## 2014-05-27 DIAGNOSIS — E662 Morbid (severe) obesity with alveolar hypoventilation: Secondary | ICD-10-CM | POA: Diagnosis present

## 2014-05-27 DIAGNOSIS — K449 Diaphragmatic hernia without obstruction or gangrene: Secondary | ICD-10-CM

## 2014-05-27 DIAGNOSIS — N028 Recurrent and persistent hematuria with other morphologic changes: Secondary | ICD-10-CM | POA: Diagnosis present

## 2014-05-27 DIAGNOSIS — N184 Chronic kidney disease, stage 4 (severe): Secondary | ICD-10-CM

## 2014-05-27 DIAGNOSIS — J90 Pleural effusion, not elsewhere classified: Secondary | ICD-10-CM | POA: Diagnosis not present

## 2014-05-27 DIAGNOSIS — R319 Hematuria, unspecified: Secondary | ICD-10-CM

## 2014-05-27 LAB — CBC WITH DIFFERENTIAL/PLATELET
Basophils Absolute: 0 10*3/uL (ref 0.0–0.1)
Basophils Relative: 0 % (ref 0–1)
EOS ABS: 0 10*3/uL (ref 0.0–0.7)
EOS PCT: 0 % (ref 0–5)
HEMATOCRIT: 38.1 % (ref 36.0–46.0)
Hemoglobin: 12.6 g/dL (ref 12.0–15.0)
LYMPHS ABS: 0.8 10*3/uL (ref 0.7–4.0)
LYMPHS PCT: 7 % — AB (ref 12–46)
MCH: 29.1 pg (ref 26.0–34.0)
MCHC: 33.1 g/dL (ref 30.0–36.0)
MCV: 88 fL (ref 78.0–100.0)
MONO ABS: 0.3 10*3/uL (ref 0.1–1.0)
Monocytes Relative: 2 % — ABNORMAL LOW (ref 3–12)
Neutro Abs: 11.5 10*3/uL — ABNORMAL HIGH (ref 1.7–7.7)
Neutrophils Relative %: 91 % — ABNORMAL HIGH (ref 43–77)
PLATELETS: 163 10*3/uL (ref 150–400)
RBC: 4.33 MIL/uL (ref 3.87–5.11)
RDW: 17.4 % — AB (ref 11.5–15.5)
WBC: 12.7 10*3/uL — AB (ref 4.0–10.5)

## 2014-05-27 LAB — CBG MONITORING, ED: GLUCOSE-CAPILLARY: 168 mg/dL — AB (ref 70–99)

## 2014-05-27 LAB — BASIC METABOLIC PANEL
Anion gap: 16 — ABNORMAL HIGH (ref 5–15)
BUN: 65 mg/dL — ABNORMAL HIGH (ref 6–23)
CALCIUM: 8.4 mg/dL (ref 8.4–10.5)
CO2: 28 mEq/L (ref 19–32)
Chloride: 95 mEq/L — ABNORMAL LOW (ref 96–112)
Creatinine, Ser: 1.84 mg/dL — ABNORMAL HIGH (ref 0.50–1.10)
GFR calc Af Amer: 30 mL/min — ABNORMAL LOW (ref >90)
GFR, EST NON AFRICAN AMERICAN: 26 mL/min — AB (ref >90)
Glucose, Bld: 209 mg/dL — ABNORMAL HIGH (ref 70–99)
Potassium: 4.1 mEq/L (ref 3.7–5.3)
SODIUM: 139 meq/L (ref 137–147)

## 2014-05-27 LAB — GLUCOSE, CAPILLARY
GLUCOSE-CAPILLARY: 245 mg/dL — AB (ref 70–99)
Glucose-Capillary: 245 mg/dL — ABNORMAL HIGH (ref 70–99)

## 2014-05-27 LAB — URINALYSIS, ROUTINE W REFLEX MICROSCOPIC
Bilirubin Urine: NEGATIVE
GLUCOSE, UA: NEGATIVE mg/dL
Ketones, ur: NEGATIVE mg/dL
Nitrite: NEGATIVE
Protein, ur: 100 mg/dL — AB
SPECIFIC GRAVITY, URINE: 1.014 (ref 1.005–1.030)
Urobilinogen, UA: 0.2 mg/dL (ref 0.0–1.0)
pH: 5 (ref 5.0–8.0)

## 2014-05-27 LAB — TROPONIN I
TROPONIN I: 0.53 ng/mL — AB (ref <0.30)
TROPONIN I: 0.47 ng/mL — AB (ref <0.30)

## 2014-05-27 LAB — URINE MICROSCOPIC-ADD ON

## 2014-05-27 LAB — I-STAT TROPONIN, ED: Troponin i, poc: 0.41 ng/mL (ref 0.00–0.08)

## 2014-05-27 LAB — TSH: TSH: 3.09 u[IU]/mL (ref 0.350–4.500)

## 2014-05-27 LAB — PRO B NATRIURETIC PEPTIDE: PRO B NATRI PEPTIDE: 10297 pg/mL — AB (ref 0–450)

## 2014-05-27 MED ORDER — ASPIRIN 81 MG PO CHEW
324.0000 mg | CHEWABLE_TABLET | Freq: Once | ORAL | Status: AC
  Administered 2014-05-27: 324 mg via ORAL
  Filled 2014-05-27: qty 4

## 2014-05-27 MED ORDER — FUROSEMIDE 10 MG/ML IJ SOLN
80.0000 mg | Freq: Two times a day (BID) | INTRAMUSCULAR | Status: DC
  Administered 2014-05-27 – 2014-05-30 (×6): 80 mg via INTRAVENOUS
  Filled 2014-05-27 (×8): qty 8

## 2014-05-27 MED ORDER — ACETAMINOPHEN 325 MG PO TABS
650.0000 mg | ORAL_TABLET | ORAL | Status: DC | PRN
  Filled 2014-05-27: qty 2

## 2014-05-27 MED ORDER — SODIUM CHLORIDE 0.9 % IJ SOLN: 3.0000 mL | INTRAMUSCULAR | Status: DC | PRN

## 2014-05-27 MED ORDER — HYDRALAZINE HCL 50 MG PO TABS
50.0000 mg | ORAL_TABLET | Freq: Every day | ORAL | Status: DC
  Administered 2014-05-28 – 2014-05-30 (×3): 50 mg via ORAL
  Filled 2014-05-27 (×3): qty 1

## 2014-05-27 MED ORDER — SODIUM CHLORIDE 0.9 % IJ SOLN
3.0000 mL | Freq: Two times a day (BID) | INTRAMUSCULAR | Status: DC
  Administered 2014-05-27 – 2014-05-30 (×5): 3 mL via INTRAVENOUS

## 2014-05-27 MED ORDER — ALPRAZOLAM 0.5 MG PO TABS
0.5000 mg | ORAL_TABLET | Freq: Every evening | ORAL | Status: DC | PRN
  Filled 2014-05-27: qty 1

## 2014-05-27 MED ORDER — PREDNISONE 50 MG PO TABS
50.0000 mg | ORAL_TABLET | Freq: Every day | ORAL | Status: DC
  Administered 2014-05-28 – 2014-05-30 (×3): 50 mg via ORAL
  Filled 2014-05-27 (×5): qty 1

## 2014-05-27 MED ORDER — FLUTICASONE PROPIONATE 50 MCG/ACT NA SUSP: 2.0000 | Freq: Every day | NASAL | Status: DC | PRN

## 2014-05-27 MED ORDER — PANTOPRAZOLE SODIUM 40 MG PO TBEC
40.0000 mg | DELAYED_RELEASE_TABLET | Freq: Every day | ORAL | Status: DC
  Administered 2014-05-28 – 2014-05-30 (×3): 40 mg via ORAL
  Filled 2014-05-27 (×3): qty 1

## 2014-05-27 MED ORDER — LEVOTHYROXINE SODIUM 88 MCG PO TABS
88.0000 ug | ORAL_TABLET | Freq: Every day | ORAL | Status: DC
Start: 2014-05-28 — End: 2014-05-30
  Administered 2014-05-28 – 2014-05-30 (×2): 88 ug via ORAL
  Filled 2014-05-27 (×4): qty 1

## 2014-05-27 MED ORDER — NEBIVOLOL HCL 10 MG PO TABS
20.0000 mg | ORAL_TABLET | Freq: Every day | ORAL | Status: DC
  Administered 2014-05-28 – 2014-05-29 (×2): 20 mg via ORAL
  Filled 2014-05-27 (×4): qty 2

## 2014-05-27 MED ORDER — SODIUM CHLORIDE 0.9 % IV SOLN: 250.0000 mL | INTRAVENOUS | Status: DC | PRN

## 2014-05-27 MED ORDER — ONDANSETRON HCL 4 MG/2ML IJ SOLN
4.0000 mg | Freq: Four times a day (QID) | INTRAMUSCULAR | Status: DC | PRN
Start: 2014-05-27 — End: 2014-05-30

## 2014-05-27 MED ORDER — TECHNETIUM TO 99M ALBUMIN AGGREGATED
6.0000 | Freq: Once | INTRAVENOUS | Status: AC | PRN
  Administered 2014-05-27: 6 via INTRAVENOUS

## 2014-05-27 MED ORDER — HEPARIN SODIUM (PORCINE) 5000 UNIT/ML IJ SOLN
5000.0000 [IU] | Freq: Three times a day (TID) | INTRAMUSCULAR | Status: DC
  Administered 2014-05-27 – 2014-05-30 (×7): 5000 [IU] via SUBCUTANEOUS
  Filled 2014-05-27 (×9): qty 1

## 2014-05-27 MED ORDER — FAMOTIDINE 20 MG PO TABS
20.0000 mg | ORAL_TABLET | Freq: Two times a day (BID) | ORAL | Status: DC
  Administered 2014-05-27 – 2014-05-30 (×6): 20 mg via ORAL
  Filled 2014-05-27 (×7): qty 1

## 2014-05-27 MED ORDER — INSULIN ASPART 100 UNIT/ML ~~LOC~~ SOLN
0.0000 [IU] | Freq: Three times a day (TID) | SUBCUTANEOUS | Status: DC
  Administered 2014-05-27: 8 [IU] via SUBCUTANEOUS
  Administered 2014-05-28: 3 [IU] via SUBCUTANEOUS
  Administered 2014-05-28: 5 [IU] via SUBCUTANEOUS
  Administered 2014-05-29: 2 [IU] via SUBCUTANEOUS
  Administered 2014-05-29: 3 [IU] via SUBCUTANEOUS
  Administered 2014-05-30: 8 [IU] via SUBCUTANEOUS

## 2014-05-27 NOTE — ED Notes (Signed)
MD at bedside. 

## 2014-05-27 NOTE — Consult Note (Signed)
Reason for Consult: IgA nephropathy Referring Physician: Dr. Carles Collet  Chief Complaint: Dizziness and dyspnea  Assessment: 1. Dyspnea secondary to CHF in a patient with a positive hepatojugular reflux. 2. IgA nephropathy prednisone response with 8gm proteinuria decreasing to 2gm range within a month; steroids started in early May 2015. 3. LLL lung mass s/p LL lobectomy biopsy showing invasive adenocarcinoma --> no XRT or chemotherapy 4. Steroid induced diabetes (steroids recently decreased from 128m) 5. CKD III (?BL but appears to be ~1.7-2.0) 6. Hypertension 7. Hypothyroidism  Plan 1.   Continue Lasix 850mtwice daily. 2.   Titrate down or discontinue the hydralazine if blood pressure is low. 3.   Goal is to maximize fluid removal and once goal weight is achieved to restart an ARB (cough with ACE-I); difficult to uptitrate diuretics with ARB onboard in a patient w/ intermittent episodes of symptomatic hypotension. 4.   Daily weights and strict I&O's to ensure steady diuresis. 5.   Will monitor K closely with the IV Lasix.   HPI: Susan Pittman an [age].o. female is a very pleasant woman with a left lower lobectomy secondary to lung adenocarcinoma and then newly diagnosed IgA nephropathy 5% crescents with 8gm proteinuria prior to treatment. She has responded nicely with a decrease in proteinuria to the 2gm range but has had steroid induced proteinuria and hypotensive episodes from antihypertensives and diuretics. She has required changes to her diuretic and antihypertensive regimen because of hypo and hypertension. Recently earlier this week she presented to Dr. DuLorrene Reidith profound edema to her thighs at which time the lasix was increased from 8054maily and patient was told to stop the hydralazine given concern of symptomatic hypotension. Apparently the patient continued to take the hydralazine. She denies any syncopal episodes, exertional chest pain. She also denies cough, fever, chills,  nausea. She denies hematuria, dysuria or obstructive like symptoms.  ROS Pertinent items are noted in HPI.  Chemistry and CBC: Creat  Date/Time Value Ref Range Status  02/14/2014 10:30 AM 2.34* 0.50 - 1.10 mg/dL Final  01/25/2014  1:14 PM 2.46* 0.50 - 1.10 mg/dL Final  01/24/2014  1:34 PM 2.41* 0.50 - 1.10 mg/dL Final  11/02/2013 11:23 AM 1.06  0.50 - 1.10 mg/dL Final  10/13/2013  8:46 AM 1.05  0.50 - 1.10 mg/dL Final     Creatinine, Ser  Date/Time Value Ref Range Status  05/27/2014 11:53 AM 1.84* 0.50 - 1.10 mg/dL Final  04/12/2014  3:27 PM 2.00* 0.50 - 1.10 mg/dL Final  04/12/2014  1:56 PM 2.1* 0.4 - 1.2 mg/dL Final  03/18/2014 10:09 AM 1.8* 0.4 - 1.2 mg/dL Final  02/03/2014  8:39 AM 2.6* 0.4 - 1.2 mg/dL Final  01/18/2014 12:04 PM 2.0* 0.4 - 1.2 mg/dL Final  12/17/2013  4:10 AM 1.21* 0.50 - 1.10 mg/dL Final  12/16/2013  4:09 AM 1.04  0.50 - 1.10 mg/dL Final  12/14/2013 12:50 PM 1.09  0.50 - 1.10 mg/dL Final  12/13/2013  6:13 AM 1.05  0.50 - 1.10 mg/dL Final  12/12/2013  9:30 AM 1.00  0.50 - 1.10 mg/dL Final  11/09/2013  3:36 AM 1.11* 0.50 - 1.10 mg/dL Final  11/08/2013  6:43 AM 1.30* 0.50 - 1.10 mg/dL Final  09/13/2013  8:44 AM 1.1  0.4 - 1.2 mg/dL Final  08/31/2013 12:02 PM 1.2  0.4 - 1.2 mg/dL Final  04/01/2013  9:36 AM 1.2  0.4 - 1.2 mg/dL Final  09/10/2012  3:37 PM 1.1  0.4 - 1.2 mg/dL Final  02/12/2012  1:41 PM 1.2  0.4 - 1.2 mg/dL Final  01/16/2012  1:30 PM 1.1  0.4 - 1.2 mg/dL Final  07/25/2011  8:52 AM 1.1  0.4 - 1.2 mg/dL Final  04/10/2011 12:14 PM 1.2  0.4 - 1.2 mg/dL Final  02/28/2011  1:47 PM 1.1  0.4 - 1.2 mg/dL Final  06/29/2010 12:00 AM 1.0  0.4-1.2 mg/dL Final  09/18/2009  1:59 PM 1.0  0.4-1.2 mg/dL Final    Recent Labs Lab 05/27/14 1153  NA 139  K 4.1  CL 95*  CO2 28  GLUCOSE 209*  BUN 65*  CREATININE 1.84*  CALCIUM 8.4    Recent Labs Lab 05/27/14 1153  WBC 12.7*  NEUTROABS 11.5*  HGB 12.6  HCT 38.1  MCV 88.0  PLT 163   Liver Function Tests: No results found for  this basename: AST, ALT, ALKPHOS, BILITOT, PROT, ALBUMIN,  in the last 168 hours No results found for this basename: LIPASE, AMYLASE,  in the last 168 hours No results found for this basename: AMMONIA,  in the last 168 hours Cardiac Enzymes:  Recent Labs Lab 05/27/14 1153  TROPONINI 0.53*   Iron Studies: No results found for this basename: IRON, TIBC, TRANSFERRIN, FERRITIN,  in the last 72 hours PT/INR: @LABRCNTIP (inr:5)  Xrays/Other Studies: ) Results for orders placed during the hospital encounter of 05/27/14 (from the past 48 hour(s))  CBC WITH DIFFERENTIAL     Status: Abnormal   Collection Time    05/27/14 11:53 AM      Result Value Ref Range   WBC 12.7 (*) 4.0 - 10.5 K/uL   RBC 4.33  3.87 - 5.11 MIL/uL   Hemoglobin 12.6  12.0 - 15.0 g/dL   HCT 38.1  36.0 - 46.0 %   MCV 88.0  78.0 - 100.0 fL   MCH 29.1  26.0 - 34.0 pg   MCHC 33.1  30.0 - 36.0 g/dL   RDW 17.4 (*) 11.5 - 15.5 %   Platelets 163  150 - 400 K/uL   Neutrophils Relative % 91 (*) 43 - 77 %   Neutro Abs 11.5 (*) 1.7 - 7.7 K/uL   Lymphocytes Relative 7 (*) 12 - 46 %   Lymphs Abs 0.8  0.7 - 4.0 K/uL   Monocytes Relative 2 (*) 3 - 12 %   Monocytes Absolute 0.3  0.1 - 1.0 K/uL   Eosinophils Relative 0  0 - 5 %   Eosinophils Absolute 0.0  0.0 - 0.7 K/uL   Basophils Relative 0  0 - 1 %   Basophils Absolute 0.0  0.0 - 0.1 K/uL  BASIC METABOLIC PANEL     Status: Abnormal   Collection Time    05/27/14 11:53 AM      Result Value Ref Range   Sodium 139  137 - 147 mEq/L   Potassium 4.1  3.7 - 5.3 mEq/L   Chloride 95 (*) 96 - 112 mEq/L   CO2 28  19 - 32 mEq/L   Glucose, Bld 209 (*) 70 - 99 mg/dL   BUN 65 (*) 6 - 23 mg/dL   Creatinine, Ser 1.84 (*) 0.50 - 1.10 mg/dL   Calcium 8.4  8.4 - 10.5 mg/dL   GFR calc non Af Amer 26 (*) >90 mL/min   GFR calc Af Amer 30 (*) >90 mL/min   Comment: (NOTE)     The eGFR has been calculated using the CKD EPI equation.     This calculation has not been  validated in all clinical  situations.     eGFR's persistently <90 mL/min signify possible Chronic Kidney     Disease.   Anion gap 16 (*) 5 - 15  PRO B NATRIURETIC PEPTIDE     Status: Abnormal   Collection Time    05/27/14 11:53 AM      Result Value Ref Range   Pro B Natriuretic peptide (BNP) 10297.0 (*) 0 - 450 pg/mL  TROPONIN I     Status: Abnormal   Collection Time    05/27/14 11:53 AM      Result Value Ref Range   Troponin I 0.53 (*) <0.30 ng/mL   Comment:            Due to the release kinetics of cTnI,     a negative result within the first hours     of the onset of symptoms does not rule out     myocardial infarction with certainty.     If myocardial infarction is still suspected,     repeat the test at appropriate intervals.     CRITICAL RESULT CALLED TO, READ BACK BY AND VERIFIED WITH:     GENTILE,E RN @ 1301 05/27/14 LEONARD,A  Randolm Idol, ED     Status: Abnormal   Collection Time    05/27/14 11:59 AM      Result Value Ref Range   Troponin i, poc 0.41 (*) 0.00 - 0.08 ng/mL   Comment NOTIFIED PHYSICIAN     Comment 3            Comment: Due to the release kinetics of cTnI,     a negative result within the first hours     of the onset of symptoms does not rule out     myocardial infarction with certainty.     If myocardial infarction is still suspected,     repeat the test at appropriate intervals.  CBG MONITORING, ED     Status: Abnormal   Collection Time    05/27/14 12:27 PM      Result Value Ref Range   Glucose-Capillary 168 (*) 70 - 99 mg/dL   Comment 1 Notify RN     Comment 2 Documented in Chart    URINALYSIS, ROUTINE W REFLEX MICROSCOPIC     Status: Abnormal   Collection Time    05/27/14 12:40 PM      Result Value Ref Range   Color, Urine YELLOW  YELLOW   APPearance CLEAR  CLEAR   Specific Gravity, Urine 1.014  1.005 - 1.030   pH 5.0  5.0 - 8.0   Glucose, UA NEGATIVE  NEGATIVE mg/dL   Hgb urine dipstick SMALL (*) NEGATIVE   Bilirubin Urine NEGATIVE  NEGATIVE   Ketones, ur  NEGATIVE  NEGATIVE mg/dL   Protein, ur 100 (*) NEGATIVE mg/dL   Urobilinogen, UA 0.2  0.0 - 1.0 mg/dL   Nitrite NEGATIVE  NEGATIVE   Leukocytes, UA SMALL (*) NEGATIVE  URINE MICROSCOPIC-ADD ON     Status: Abnormal   Collection Time    05/27/14 12:40 PM      Result Value Ref Range   Squamous Epithelial / LPF FEW (*) RARE   WBC, UA 3-6  <3 WBC/hpf   RBC / HPF 7-10  <3 RBC/hpf   Bacteria, UA FEW (*) RARE   Casts GRANULAR CAST (*) NEGATIVE   Comment: HYALINE CASTS   Dg Chest 2 View  05/27/2014   CLINICAL DATA:  Dizziness, low blood  pressure  EXAM: CHEST  2 VIEW  COMPARISON:  04/12/2014, CT chest 02/17/2014  FINDINGS: There is a loculated left lower lung pleural effusion. There is no focal consolidation, pneumothorax or right pleural effusion. Stable cardiomediastinal silhouette. Unremarkable osseous structures.  IMPRESSION: No acute cardiopulmonary disease. Loculated left lower pleural effusion.   Electronically Signed   By: Kathreen Devoid   On: 05/27/2014 12:17   Nm Pulmonary Perf And Vent  05/27/2014   CLINICAL DATA:  Shortness of breath  EXAM: NUCLEAR MEDICINE VENTILATION - PERFUSION LUNG SCAN  TECHNIQUE: Ventilation images were obtained in multiple projections using inhaled aerosol technetium 99 M DTPA. Perfusion images were obtained in multiple projections after intravenous injection of Tc-93mMAA.  RADIOPHARMACEUTICALS:  40 mCi Tc-952mTPA aerosol and 6 mCi Tc-9975mA  COMPARISON:  None.  FINDINGS: Ventilation: Slight decreased ventilation in the lingula. Central deposition of radiotracer as can be seen with lower airways disease. No other ventilatory defect. Relative hypoventilation of the left lung compared to the right.  Perfusion: No wedge shaped peripheral perfusion defects to suggest acute pulmonary embolism. Matching perfusion defect in the lingula with corresponding chest x-ray abnormality.  IMPRESSION: 1. Low probability for pulmonary embolus.   Electronically Signed   By: HetKathreen Devoid On: 05/27/2014 16:47    PMH:   Past Medical History  Diagnosis Date  . Asthma   . Hypertension   . GERD (gastroesophageal reflux disease)   . Allergy   . Hypothyroid   . Osteoporosis   . Gastric ulcer   . Hiatal hernia   . Hyperlipidemia     "don't take RX for it" (11/08/2013)  . Renal artery stenosis   . Lung mass     superior segment LLL/notes 11/08/2013  . Pneumonia     "I've had it ?3 times" (11/08/2013)  . Arthritis     "knees, hips, back, hands" (11/08/2013)  . DJD (degenerative joint disease)   . Lung nodule/ adenoca > LLobectomy 12/15/13  11/08/2013    PET 11/23/2013 1. Dominant sub solid left lower lobe nodule does not demonstrate significantly increased metabolic activity. However, morphologically, adenocarcinoma is still a concern.   - Spirometry 11/24/13 wnl  - 11/24/2013 T surg eval rec> LLower lobectomy 12/15/2013 for adenoca   . CAD (coronary artery disease), non obstructive by cardiac cath 12/12/13 01/24/2014  . Basal cell carcinoma     "cut them off face & right shoulder" (11/08/2013)    PSH:   Past Surgical History  Procedure Laterality Date  . Knee arthroscopy Bilateral   . Anterior cervical decomp/discectomy fusion  2000    C5-C6  . Renal artery stent Right 11/08/2013    /noArchie Endo5/2015  . Tonsillectomy and adenoidectomy  ?1946  . Cholecystectomy  1970's  . Vaginal hysterectomy  1970  . Dilation and curettage of uterus  1960's  . Skin cancer excision    . Coronary angiogram      Hx: of 2/ 2015  . Cardiac catheterization      Hx: of 2/ 2015  . Video bronchoscopy N/A 12/15/2013    Procedure: VIDEO BRONCHOSCOPY;  Surgeon: EdwGrace IsaacD;  Location: [facility];  Service: Thoracic;  Laterality: N/A;  . Video assisted thoracoscopy (vats)/wedge resection Left 12/15/2013    Procedure: VIDEO ASSISTED THORACOSCOPY (VATS)/WEDGE RESECTION;  Surgeon: EdwGrace IsaacD;  [facility]  Laterality: Left;  . Lobectomy Left 12/15/2013    Procedure:  LOBECTOMY;  Surgeon: EdwLilia Argue  Servando Snare, MD;  Location: [place];  Service: Thoracic;  Laterality: Left;    Allergies:  Allergies  Allergen Reactions  . Benazepril Hcl Anaphylaxis and Cough  . Loratadine Anaphylaxis    anaphylaxis  . Celecoxib     REACTION: aching  . Lisinopril     REACTION: cough  . Allegra [Fexofenadine Hcl]     Severe back pain  . Sulfa Antibiotics Hives    Medications:   Prior to Admission medications   Medication Sig Start Date End Date Taking? Authorizing Provider  albuterol (PROVENTIL HFA;VENTOLIN HFA) 108 (90 BASE) MCG/ACT inhaler Inhale 1-2 puffs into the lungs every 6 (six) hours as needed for wheezing or shortness of breath. 12/31/13  Yes Grace Isaac, MD  ALPRAZolam Duanne Moron) 0.5 MG tablet Take 0.5 mg by mouth at bedtime as needed for sleep.   Yes Historical Provider, MD  Calcium Carb-Cholecalciferol 600-800 MG-UNIT TABS Take 1 tablet by mouth daily.   Yes Historical Provider, MD  cetirizine (ZYRTEC) 10 MG tablet Take 10 mg by mouth at bedtime.    Yes Historical Provider, MD  DULERA 100-5 MCG/ACT AERO Inhale 2 puffs into the lungs 2 (two) times daily as needed for shortness of breath.  04/13/14  Yes Historical Provider, MD  famotidine (PEPCID) 20 MG tablet Take 20 mg by mouth 2 (two) times daily.   Yes Historical Provider, MD  fluticasone (FLONASE) 50 MCG/ACT nasal spray Place 2 sprays into both nostrils daily as needed for allergies or rhinitis.   Yes Historical Provider, MD  furosemide (LASIX) 80 MG tablet Take 80 mg by mouth 2 (two) times daily.   Yes Historical Provider, MD  hydrALAZINE (APRESOLINE) 50 MG tablet Take 50 mg by mouth daily.   Yes Historical Provider, MD  levothyroxine (SYNTHROID, LEVOTHROID) 88 MCG tablet Take 88 mcg by mouth daily before breakfast.   Yes Historical Provider, MD  nebivolol (BYSTOLIC) 10 MG tablet Take 20 mg by mouth daily.   Yes Historical Provider, MD  pantoprazole (PROTONIX) 40 MG tablet Take 1 tablet (40 mg total) by  mouth daily. Take 30-60 min before first meal of the day 04/22/14  Yes Tanda Rockers, MD  predniSONE (DELTASONE) 50 MG tablet Take 50 mg by mouth daily with breakfast.    Yes Historical Provider, MD  Blood Glucose Monitoring Suppl (ONE TOUCH ULTRA 2) W/DEVICE KIT 1 Device by Does not apply route once. 04/01/14   Renato Shin, MD  glucose blood (ONE TOUCH ULTRA TEST) test strip Pt to test 3-4x daily due to labile sugars Dx. 656.81 04/20/14   [name], MD  Lancets [facility] ULTRASOFT) lancets Use 1 per day dx code 249.00 04/19/14   Elayne Snare, MD  [facility] DELICA LANCETS 27N MISC Use to check blood sugar once daily as instructed. Dx code: 790.29 04/19/14   Renato Shin, MD    Discontinued Meds:   Medications Discontinued During This Encounter  Medication Reason  . potassium chloride SA (K-DUR,KLOR-CON) 20 MEQ tablet Patient has not taken in last 30 days  . valsartan (DIOVAN) 80 MG tablet   . acetaminophen (TYLENOL) 500 MG tablet Patient has not taken in last 30 days  . ALPRAZolam (XANAX) 0.5 MG tablet Inpatient Standard  . BYSTOLIC 10 MG tablet Inpatient Standard  . famotidine (PEPCID) 20 MG tablet Inpatient Standard  . hydrALAZINE (APRESOLINE) 50 MG tablet Change in therapy  . pantoprazole (PROTONIX) 40 MG tablet Duplicate    Social History:  reports that she has never smoked. She  has never used smokeless tobacco. She reports that she does not drink alcohol or use illicit drugs.  Family History:   Family History  Problem Relation Age of Onset  . Breast cancer Sister   . Emphysema Father     smoked  . Allergies Sister   . Allergies Brother   . Heart disease Mother   . Heart disease Father   . Diabetes Father   . Diabetes Brother     Blood pressure 161/75, pulse 60, temperature 97.8 F (36.6 C), temperature source Oral, resp. rate 18, height 5' 5"  (1.651 m), weight 102.3 kg (225 lb 8.5 oz), SpO2 100.00%. General appearance: alert, cooperative, no distress and moderately  obese Head: Normocephalic, without obvious abnormality, atraumatic Eyes: negative Throat: lips, mucosa, and tongue normal; teeth and gums normal Neck: no adenopathy, no carotid bruit, no JVD, supple, symmetrical, trachea midline and thyroid not enlarged, symmetric, no tenderness/mass/nodules Resp: rales bibasilar Chest wall: no tenderness Cardio: regular rate and rhythm, S1, S2 normal, no murmur, click, rub or gallop GI: soft, non-tender; bowel sounds normal; no masses,  no organomegaly Extremities: edema 2+ Pulses: 2+ and symmetric Skin: Skin color, texture, turgor normal. No rashes or lesions Neurologic: Grossly normal       Mansi Tokar, Hunt Oris, MD 05/27/2014, 6:05 PM

## 2014-05-27 NOTE — ED Provider Notes (Signed)
CSN: [id number]     Arrival date & time 05/27/14  1118 History   First MD Initiated Contact with Patient 05/27/14 1119     Chief Complaint  Patient presents with  . Weakness  . Hypotension     (Consider location/radiation/quality/duration/timing/severity/associated sxs/prior Treatment) The history is provided by the patient and medical records.   This is a 73 year old female with past medical history significant for hypertension, hypothyroidism, renal artery stenosis status post stent placement, IgA nephropathy, coronary artery disease, history of lung cancer with previous lobectomy, presenting to the ED for generalized weakness and episode of hypertension. Patient states over the past few weeks she has been having frequent episodes of dizziness and lightheadedness occuring approx. 30 minutes after taking her medications, worse over the past few days. She states this morning she took her medications around 0600. States she was sitting in the chair and felt that she was unable to get up due to her weakness and dizziness.  States her nephrologist, Dr. Lorrene Reid, discontinued her losartan a few weeks ago, but did increase her lasix over the past few days due to fluid overload and some exertional SOB.  Recent duplex of renal stent on 05/06/14 earlier this month, patent without re-stenosis.  Denies fever, chills, recent illness, chest pain, abdominal pain, nausea, vomiting, diarrhea.  On arrival, pt states she still feels somewhat weak but all dizziness and lightheadedness has resolved.  Pts husband mentions that they did recently travel to [place], seen at an ER there due to high BP and SOB.  She had bilateral venous duplex of her legs which were negative for DVT.  She has no prior hx of DVT or PE.  VS stable on arrival.  Past Medical History  Diagnosis Date  . Asthma   . Hypertension   . GERD (gastroesophageal reflux disease)   . Allergy   . Hypothyroid   . Osteoporosis   . Gastric ulcer   . Hiatal  hernia   . Hyperlipidemia     "don't take RX for it" (11/08/2013)  . Renal artery stenosis   . Lung mass     superior segment LLL/notes 11/08/2013  . Pneumonia     "I've had it ?3 times" (11/08/2013)  . Arthritis     "knees, hips, back, hands" (11/08/2013)  . DJD (degenerative joint disease)   . Lung nodule/ adenoca > LLobectomy 12/15/13  11/08/2013    PET 11/23/2013 1. Dominant sub solid left lower lobe nodule does not demonstrate significantly increased metabolic activity. However, morphologically, adenocarcinoma is still a concern.   - Spirometry 11/24/13 wnl  - 11/24/2013 T surg eval rec> LLower lobectomy 12/15/2013 for adenoca   . CAD (coronary artery disease), non obstructive by cardiac cath 12/12/13 01/24/2014  . Basal cell carcinoma     "cut them off face & right shoulder" (11/08/2013)   Past Surgical History  Procedure Laterality Date  . Knee arthroscopy Bilateral   . Anterior cervical decomp/discectomy fusion  2000    C5-C6  . Renal artery stent Right 11/08/2013    Archie Endo 11/08/2013  . Tonsillectomy and adenoidectomy  ?1946  . Cholecystectomy  1970's  . Vaginal hysterectomy  1970  . Dilation and curettage of uterus  1960's  . Skin cancer excision    . Coronary angiogram      Hx: of 2/ 2015  . Cardiac catheterization      Hx: of 2/ 2015  . Video bronchoscopy N/A 12/15/2013    Procedure: VIDEO BRONCHOSCOPY;  Surgeon: Percell Miller  Maryruth Bun, MD;  Location: [facility];  Service: Thoracic;  Laterality: N/A;  . Video assisted thoracoscopy (vats)/wedge resection Left 12/15/2013    Procedure: VIDEO ASSISTED THORACOSCOPY (VATS)/WEDGE RESECTION;  Surgeon: Grace Isaac, MD;  Location: [facility];  Service: Thoracic;  Laterality: Left;  . Lobectomy Left 12/15/2013    Procedure: LOBECTOMY;  Surgeon: Grace Isaac, MD;  Location: [facility] OR;  Service: Thoracic;  Laterality: Left;   Family History  Problem Relation Age of Onset  . Breast cancer Sister   . Emphysema Father     smoked  . Allergies Sister   .  Allergies Brother   . Heart disease Mother   . Heart disease Father   . Diabetes Father   . Diabetes Brother    History  Substance Use Topics  . Smoking status: Never Smoker   . Smokeless tobacco: Never Used  . Alcohol Use: No   OB History   Grav Para Term Preterm Abortions TAB SAB Ect Mult Living                 Review of Systems  Neurological: Positive for dizziness and light-headedness.  All other systems reviewed and are negative.     Allergies  Benazepril hcl; Loratadine; Celecoxib; Lisinopril; Allegra; and Sulfa antibiotics  Home Medications   Prior to Admission medications   Medication Sig Start Date End Date Taking? Authorizing Provider  albuterol (PROVENTIL HFA;VENTOLIN HFA) 108 (90 BASE) MCG/ACT inhaler Inhale 1-2 puffs into the lungs every 6 (six) hours as needed for wheezing or shortness of breath. 12/31/13   Grace Isaac, MD  Blood Glucose Monitoring Suppl (ONE TOUCH ULTRA 2) W/DEVICE KIT 1 Device by Does not apply route once. 04/01/14   Renato Shin, MD  BYSTOLIC 10 MG tablet TAKE TWO TABLETS BY MOUTH ONCE DAILY 04/13/14   [name], MD  Calcium Carb-Cholecalciferol 600-800 MG-UNIT TABS Take 1 tablet by mouth daily.    Historical Provider, MD  cetirizine (ZYRTEC) 10 MG tablet Take 10 mg by mouth at bedtime.     Historical Provider, MD  DULERA 100-5 MCG/ACT AERO Inhale into the lungs as needed.  04/13/14   Historical Provider, MD  famotidine (PEPCID) 20 MG tablet Take 1 tablet (20 mg total) by mouth 2 (two) times daily. One at bedtime 05/23/14   Tanda Rockers, MD  fluticasone [facility]) 50 MCG/ACT nasal spray Place 2 sprays into both nostrils daily as needed for allergies or rhinitis.    Historical Provider, MD  furosemide (LASIX) 80 MG tablet Take 80 mg by mouth 2 (two) times daily.    Historical Provider, MD  glucose blood (ONE TOUCH ULTRA TEST) test strip Pt to test 3-4x daily due to labile sugars Dx. 027.25 04/20/14   [name], MD   hydrALAZINE (APRESOLINE) 50 MG tablet Take 1 tablet (50 mg total) by mouth 3 (three) times daily. 03/03/14   Troy Sine, MD  Lancets [facility] ULTRASOFT) lancets Use 1 per day dx code 249.00 04/19/14   Elayne Snare, MD  levothyroxine (SYNTHROID, LEVOTHROID) 88 MCG tablet Take 88 mcg by mouth daily before breakfast.    Historical Provider, MD  [facility] DELICA LANCETS 36U MISC Use to check blood sugar once daily as instructed. Dx code: 790.29 04/19/14   Renato Shin, MD  pantoprazole (PROTONIX) 40 MG tablet Take 1 tablet (40 mg total) by mouth daily. Take 30-60 min before first meal of the day 04/22/14   Tanda Rockers, MD  pantoprazole (  PROTONIX) 40 MG tablet  04/22/14   Historical Provider, MD  predniSONE (DELTASONE) 50 MG tablet Take 100 mg by mouth daily with breakfast.    Historical Provider, MD   BP 145/63  Pulse 68  Temp(Src) 99 F (37.2 C) (Oral)  Resp 18  Ht 5' 5"  (1.651 m)  Wt 223 lb (101.152 kg)  BMI 37.11 kg/m2  SpO2 98%  Physical Exam  Nursing note and vitals reviewed. Constitutional: She is oriented to person, place, and time. She appears well-developed and well-nourished. No distress.  HENT:  Head: Normocephalic and atraumatic.  Mouth/Throat: Oropharynx is clear and moist.  Eyes: Conjunctivae and EOM are normal. Pupils are equal, round, and reactive to light.  Neck: Normal range of motion. Neck supple.  Cardiovascular: Normal rate, regular rhythm and normal heart sounds.   Pulmonary/Chest: Effort normal and breath sounds normal. No respiratory distress. She has no wheezes.  Abdominal: Soft. Bowel sounds are normal. There is no tenderness. There is no guarding.  Musculoskeletal: Normal range of motion.  No calf asymmetry, tenderness, or palpable cords; no overlying erythema or warmth to touch Trace edema BLE  Neurological: She is alert and oriented to person, place, and time.  AAOx3, answering questions appropriately; equal strength UE and LE bilaterally; CN grossly  intact; moves all extremities appropriately without ataxia; no focal neuro deficits or facial asymmetry appreciated  Skin: Skin is warm and dry. She is not diaphoretic.  Psychiatric: She has a normal mood and affect.    ED Course  Procedures (including critical care time)  CRITICAL CARE Performed by: Larene Pickett   Total critical care time: 30  Critical care time was exclusive of separately billable procedures and treating other patients.  Critical care was necessary to treat or prevent imminent or life-threatening deterioration.  Critical care was time spent personally by me on the following activities: development of treatment plan with patient and/or surrogate as well as nursing, discussions with consultants, evaluation of patient's response to treatment, examination of patient, obtaining history from patient or surrogate, ordering and performing treatments and interventions, ordering and review of laboratory studies, ordering and review of radiographic studies, pulse oximetry and re-evaluation of patient's condition.  Labs Review Labs Reviewed  CBC WITH DIFFERENTIAL - Abnormal; Notable for the following:    WBC 12.7 (*)    RDW 17.4 (*)    Neutrophils Relative % 91 (*)    Neutro Abs 11.5 (*)    Lymphocytes Relative 7 (*)    Monocytes Relative 2 (*)    All other components within normal limits  BASIC METABOLIC PANEL - Abnormal; Notable for the following:    Chloride 95 (*)    Glucose, Bld 209 (*)    BUN 65 (*)    Creatinine, Ser 1.84 (*)    GFR calc non Af Amer 26 (*)    GFR calc Af Amer 30 (*)    Anion gap 16 (*)    All other components within normal limits  URINALYSIS, ROUTINE W REFLEX MICROSCOPIC - Abnormal; Notable for the following:    Hgb urine dipstick SMALL (*)    Protein, ur 100 (*)    Leukocytes, UA SMALL (*)    All other components within normal limits  PRO B NATRIURETIC PEPTIDE - Abnormal; Notable for the following:    Pro B Natriuretic peptide (BNP)  10297.0 (*)    All other components within normal limits  TROPONIN I - Abnormal; Notable for the following:    Troponin I  0.53 (*)    All other components within normal limits  URINE MICROSCOPIC-ADD ON - Abnormal; Notable for the following:    Squamous Epithelial / LPF FEW (*)    Bacteria, UA FEW (*)    Casts GRANULAR CAST (*)    All other components within normal limits  I-STAT TROPOININ, ED - Abnormal; Notable for the following:    Troponin i, poc 0.41 (*)    All other components within normal limits  CBG MONITORING, ED - Abnormal; Notable for the following:    Glucose-Capillary 168 (*)    All other components within normal limits    Imaging Review Dg Chest 2 View  05/27/2014   CLINICAL DATA:  Dizziness, low blood pressure  EXAM: CHEST  2 VIEW  COMPARISON:  04/12/2014, CT chest 02/17/2014  FINDINGS: There is a loculated left lower lung pleural effusion. There is no focal consolidation, pneumothorax or right pleural effusion. Stable cardiomediastinal silhouette. Unremarkable osseous structures.  IMPRESSION: No acute cardiopulmonary disease. Loculated left lower pleural effusion.   Electronically Signed   By: Kathreen Devoid   On: 05/27/2014 12:17     EKG Interpretation   Date/Time:  Friday May 27 2014 11:20:40 EDT Ventricular Rate:  69 PR Interval:  120 QRS Duration: 89 QT Interval:  391 QTC Calculation: 419 R Axis:   51 Text Interpretation:  Sinus rhythm Borderline repolarization abnormality  Confirmed by WARD,  DO, KRISTEN ([id number]) on 05/27/2014 11:31:24 AM      MDM   Final diagnoses:  NSTEMI (non-ST elevated myocardial infarction)  Chronic kidney disease, unspecified stage  Acute diastolic HF (heart failure)  Hx of cancer of lung   73 y.o. F with episodic lightheadedness and hypotension this morning.  This has been an ongoing issue, worse over the past 2 days.  Recent lasix increase by nephrology, Dr. Lorrene Reid.  On arrival, pt states she is feeling almost back to her  baseline, possibly slightly more sluggish than normal.  Will obtain basic labs, EKG, CXR, orthostatic VS.  EKG sinus rhythm with slight flattening of inferior and lateral t-waves.  CXR with loculated left lower lobe pleural effusion, this is not new when compared with prior chest x-rays.  Renal function appears baseline when compared with previous. BNP elevated at approximately 10,000, no vascular congestion or edema noted on CXR.  Trop positive at 0.53.  Dose of ASA given.  Patient has multiple medical problems that could be contributing to her NSTEMI including CAD, CHF, RAS, and possibly PE given her cancer history and recent travel. Will obtain VQ scan due to her SrCr.  Have discussed with cardiology, they will see her in consult- will hold heparin for now.  Disussed with hospitalist service, Dr. Carles Collet who will admit.  Larene Pickett, PA-C 05/27/14 1721

## 2014-05-27 NOTE — ED Provider Notes (Signed)
Medical screening examination/treatment/procedure(s) were conducted as a shared visit with non-physician practitioner(s) and myself.  I personally evaluated the patient during the encounter.   EKG Interpretation   Date/Time:  Friday May 27 2014 11:20:40 EDT Ventricular Rate:  69 PR Interval:  120 QRS Duration: 89 QT Interval:  391 QTC Calculation: 419 R Axis:   51 Text Interpretation:  Sinus rhythm Borderline repolarization abnormality  Inferior and lateral T wave flattening that is new Reconfirmed by Suvan Stcyr,   DO, Trino Higinbotham [phone]) on 05/27/2014 1:08:12 PM      Pt is a 73 y.o. F with history of renal artery stenosis, chronic kidney disease, hypertension, hyperlipidemia, lung cancer status post left lower lobectomy who presents to the emergency department with an episode of shortness of breath, denies weakness, dizziness and shortness of breath that lasted for approximately 2 hours this morning while at rest. She states during this episode her blood pressure ranged from 25O to 037 systolic. She states that her Dr. recently increased her Lasix. She did take her blood pressure medication today. She denies any symptoms currently. Heart and lung sounds normal. Abdomen soft nontender. No sign of volume overload on exam. Patient does have a mild leukocytosis with left shift. Her creatinine is 1.84 which is actually better than her baseline. She does have an elevated troponin.  Despite CKD she has never had an elevated troponin before. She has received aspirin in the emergency department. Her last cardiac catheterization showed 20% stenosis of the RCA but otherwise normal coronary arteries. His catheterization was in February 2015.  Her BNP is also elevated greater than 10,000. Chest x-ray does not show any obvious infiltrate or edema. Differential diagnosis also includes PE given her elevated troponin, BNP and history of lung cancer. Given her GFR is less than 30, unable to obtain CT imaging of her chest.  Will obtain VQ scan. Discuss with cardiology who will see the patient in consult. Will admit to medicine.  Amesti, DO 05/27/14 1337

## 2014-05-27 NOTE — Consult Note (Signed)
CARDIOLOGY CONSULT NOTE   Patient ID: Susan Pittman MRN: [id number], DOB/AGE: 75-Feb-1942   Admit date: 05/27/2014 Date of Consult: 05/27/2014   Primary Physician: [name], MD Primary Cardiologist: Dr. Fletcher Anon  Pt. Profile  73 year old female with history of chronic kidney disease, hypertension, lung cancer status post left lower lobe lobectomy presented with dizziness and mild shortness breath. Initial laboratory finding include mildly elevated troponin of 0.4. Cardiology was consulted regarding hypertension and troponin  Problem List  Past Medical History  Diagnosis Date  . Asthma   . Hypertension   . GERD (gastroesophageal reflux disease)   . Allergy   . Hypothyroid   . Osteoporosis   . Gastric ulcer   . Hiatal hernia   . Hyperlipidemia     "don't take RX for it" (11/08/2013)  . Renal artery stenosis   . Lung mass     superior segment LLL/notes 11/08/2013  . Pneumonia     "I've had it ?3 times" (11/08/2013)  . Arthritis     "knees, hips, back, hands" (11/08/2013)  . DJD (degenerative joint disease)   . Lung nodule/ adenoca > LLobectomy 12/15/13  11/08/2013    PET 11/23/2013 1. Dominant sub solid left lower lobe nodule does not demonstrate significantly increased metabolic activity. However, morphologically, adenocarcinoma is still a concern.   - Spirometry 11/24/13 wnl  - 11/24/2013 T surg eval rec> LLower lobectomy 12/15/2013 for adenoca   . CAD (coronary artery disease), non obstructive by cardiac cath 12/12/13 01/24/2014  . Basal cell carcinoma     "cut them off face & right shoulder" (11/08/2013)    Past Surgical History  Procedure Laterality Date  . Knee arthroscopy Bilateral   . Anterior cervical decomp/discectomy fusion  2000    C5-C6  . Renal artery stent Right 11/08/2013    Archie Endo 11/08/2013  . Tonsillectomy and adenoidectomy  ?1946  . Cholecystectomy  1970's  . Vaginal hysterectomy  1970  . Dilation and curettage of uterus  1960's  . Skin cancer excision    .  Coronary angiogram      Hx: of 2/ 2015  . Cardiac catheterization      Hx: of 2/ 2015  . Video bronchoscopy N/A 12/15/2013    Procedure: VIDEO BRONCHOSCOPY;  Surgeon: Grace Isaac, MD;  Location: [facility] OR;  Service: Thoracic;  Laterality: N/A;  . Video assisted thoracoscopy (vats)/wedge resection Left 12/15/2013    Procedure: VIDEO ASSISTED THORACOSCOPY (VATS)/WEDGE RESECTION;  Surgeon: Grace Isaac, MD;  Location: [place];  Service: Thoracic;  Laterality: Left;  . Lobectomy Left 12/15/2013    Procedure: LOBECTOMY;  Surgeon: Grace Isaac, MD;  Location: [place];  Service: Thoracic;  Laterality: Left;     Allergies  Allergies  Allergen Reactions  . Benazepril Hcl Anaphylaxis and Cough  . Loratadine Anaphylaxis    anaphylaxis  . Celecoxib     REACTION: aching  . Lisinopril     REACTION: cough  . Allegra [Fexofenadine Hcl]     Severe back pain  . Sulfa Antibiotics Hives    HPI   The patient is a obese 73 year old female with history of long-standing hypertension, hypothyroidism, remote peptic ulcer disease, GERD, history of chronic kidney disease secondary to IgA nephropathy and renal artery stenosis. Her last echocardiogram in December 2014 showed EF 55-60% and grade 2 diastolic dysfunction. As part of her workup for secondary hypertension, she was found to have significant renal artery stenosis by Doppler and underwent stenting of her right  renal artery by Dr. Gwenlyn Found on 11/08/2013. Patient returned to the hospital on 2/80/2015 with chest pain and underwent cardiac catheterization which showed EF 65%, mild non-obstructive coronary artery disease with smooth 20% RCA narrowing. She was noted to have lung nodules and underwent VATS and mini thoracotomy with lobectomy of left lower lobe in mid February. According to Dr. Tyrell Antonio notes, she continued to have leg edema and worsening chronic kidney disease, she underwent kidney biopsy which showed evidence of IgA nephropathy and was placed on  high-dose prednisone. According to the patient, she has not been active for the past month. Prior to a month ago, she does water walking in the pool. She denies any exertional chest discomfort or shortness breath while walking in the water. Patient recently went to another state for vacation and has not resumed her water walking since coming back. She states she has always been having some shortness of breath at rest, and she also has been having some imbalance secondary to dizziness. Her blood pressure was noted to be well controlled during her last followup with Dr. Fletcher Anon in the clinic on 05/03/2014. According to the patient, her weight was 235 during her last nephrology visit and her Lasix was increased to 2 tablets of 80 mg twice a day for 3 days. She did not weigh herself this morning, however yesterday morning her weight was 223. She has chronic lower extremity edema, however denies any significant orthopnea or paroxysmal nocturnal dyspnea.  This morning, according to the husband, after waking up, her systolic blood pressure was in the 110s. Shortly after taking her morning blood pressure medication, she began to feel dizzy and her blood pressure dropped down to the 90s on repeated blood pressure measurements. She called her nephrologist who recommended her to take in some high sodium food in attempt to increase her blood pressure and also withhold her hydralazine. By that time, patient already took her morning hydralazine. She ate a can of tomato soup and immediately felt some improvement of her symptom. She denies any orthostatic symptoms and states her morning dizziness occurred while she was sitting down and resting doing nothing. She denies any recent fever, chill, or cough.  Upon arrival to [facility] ED, she was normotensive. O2 saturation 98% on room air. Heart rate in the 50s to 60s. EKG showed normal sinus rhythm with heart rate 60s, minimal ST depression in the inferolateral leads. Significant  laboratory finding include creatinine of 1.84, troponin of 0.53, proBNP of greater than 10,000, white blood cell count of 12.7. Chest x-ray showed no acute cardiopulmonary process however does reveal some loculated left lower pleural effusion. Urinalysis showed few bacteria, normal white blood cell count, and negative nitrite. Cardiology was consulted for hypotension and elevated troponin.  Inpatient Medications    Family History Family History  Problem Relation Age of Onset  . Breast cancer Sister   . Emphysema Father     smoked  . Allergies Sister   . Allergies Brother   . Heart disease Mother   . Heart disease Father   . Diabetes Father   . Diabetes Brother      Social History History   Social History  . Marital Status: Married    Spouse Name: N/A    Number of Children: N/A  . Years of Education: N/A   Occupational History  . Retired    Social History Main Topics  . Smoking status: Never Smoker   . Smokeless tobacco: Never Used  . Alcohol Use:  No  . Drug Use: No  . Sexual Activity: Yes   Other Topics Concern  . Not on file   Social History Narrative  . No narrative on file     Review of Systems  General:  No chills, fever, night sweats. Weight decreased from 235 to 223 after diuresis  Cardiovascular:  No chest pain, dyspnea on exertion, orthopnea, palpitations, paroxysmal nocturnal dyspnea. +edema Dermatological: No rash, lesions/masses Respiratory: No cough. +occasional dyspnea Urologic: No hematuria, dysuria Abdominal:   No nausea, vomiting, diarrhea, bright red blood per rectum, melena, or hematemesis Neurologic:  No visual changes, wkns, changes in mental status. +dizziness with imbalance All other systems reviewed and are otherwise negative except as noted above.  Physical Exam  Blood pressure 152/67, pulse 58, temperature 99 F (37.2 C), temperature source Oral, resp. rate 16, height 5\' 5"  (1.651 m), weight 223 lb (101.152 kg), SpO2 98.00%.    General: Pleasant, NAD Psych: Normal affect. Neuro: Alert and oriented X 3. Moves all extremities spontaneously. HEENT: Normal  Neck: Supple without bruits or JVD. Lungs:  Resp regular and unlabored, CTA, decreased breath sound in LLL Heart: RRR no s3, s4, or murmurs. Abdomen: Soft, non-distended, BS + x 4. Mild tenderness on palpation, mainly on epigastric and RUQ, however no tenderness when pressed against liver Extremities: No clubbing, cyanosis. DP/PT/Radials 2+ and equal bilaterally. 2+ mixed pitting and nonpitting edema  Labs   Recent Labs  05/27/14 1153  TROPONINI 0.53*   Lab Results  Component Value Date   WBC 12.7* 05/27/2014   HGB 12.6 05/27/2014   HCT 38.1 05/27/2014   MCV 88.0 05/27/2014   PLT 163 05/27/2014    Recent Labs Lab 05/27/14 1153  NA 139  K 4.1  CL 95*  CO2 28  BUN 65*  CREATININE 1.84*  CALCIUM 8.4  GLUCOSE 209*   Lab Results  Component Value Date   CHOL 262* 03/18/2014   HDL 67.40 03/18/2014   LDLCALC 133* 03/18/2014   TRIG 307.0* 03/18/2014   No results found for this basename: DDIMER    Radiology/Studies  Dg Chest 2 View  05/27/2014   CLINICAL DATA:  Dizziness, low blood pressure  EXAM: CHEST  2 VIEW  COMPARISON:  04/12/2014, CT chest 02/17/2014  FINDINGS: There is a loculated left lower lung pleural effusion. There is no focal consolidation, pneumothorax or right pleural effusion. Stable cardiomediastinal silhouette. Unremarkable osseous structures.  IMPRESSION: No acute cardiopulmonary disease. Loculated left lower pleural effusion.   Electronically Signed   By: Kathreen Devoid   On: 05/27/2014 12:17   NUCLEAR MEDICINE VENTILATION - PERFUSION LUNG SCAN  TECHNIQUE:  Ventilation images were obtained in multiple projections using  inhaled aerosol technetium 99 M DTPA. Perfusion images were obtained  in multiple projections after intravenous injection of Tc-69m MAA.  RADIOPHARMACEUTICALS: 40 mCi Tc-43m DTPA aerosol and 6 mCi Tc-67m  MAA   COMPARISON: None.  FINDINGS:  Ventilation: Slight decreased ventilation in the lingula. Central  deposition of radiotracer as can be seen with lower airways disease.  No other ventilatory defect. Relative hypoventilation of the left  lung compared to the right.  Perfusion: No wedge shaped peripheral perfusion defects to suggest  acute pulmonary embolism. Matching perfusion defect in the lingula  with corresponding chest x-ray abnormality.  IMPRESSION:  1. Low probability for pulmonary embolus.  Electronically Signed  By: Kathreen Devoid  On: 05/27/2014 16:47   ECG  EKG showed normal sinus rhythm with heart rate 60s, minimal ST depression in  the inferolateral leads  ASSESSMENT AND PLAN  1. Dizziness  - appears to be related to blood pressure, patient very sensitive to low BP, likely due to decades of HTN  - obtain orthostatic pressure  - currently on hydralazine 50mg  daily, may consider decrease dose to 25mg , if BP uncontrolled, can increase frequency instead of dosage  2. Elevated troponin in the setting of CKD  - recent nonobstructive cath in 12/2013, suspicion for ACS very low  - trend troponin overnight, if goes down and asymptomatic, may not need any workup.   3. Elevated proBNP  - >10000, higher than what to expect from CKD alone, unclear cause  - no sign of CHF on exam, CXR clear except LLL pleural effusion after lobectomy. LE edema appears chronic, ?if has component of venous insufficiency  4. Chronic diastolic HF  - Echo December 2014 EF 55-60% and grade 2 diastolic dysfunction  5. Hypertension, s/p R renal artery stent 6. Hypothyroidism 7. history of chronic kidney disease secondary to IgA nephropathy and renal artery stenosis 8. Lung CA s/p LLL lobectomy 12/2013  SignedAlmyra Deforest, PA-C 05/27/2014, 2:37 PM Patient seen and examined and history reviewed. Agree with above findings and plan. Patient admitted with dizziness and feeling like she might black out associated  with hypotension. Patient has a history of severe HTN related to renal artery stenosis and IgA nephropathy. She is s/p renal stent. Since treatment of nephropathy her BP has improved and her BP meds have been reduced. Diovan was discontinued in early July and Hydralazine has been reduced from tid to bid. She denies any chest pain or SOB. She has chronic LE edema that is stable. She is on Lasix 80 mg bid. Prior cardiac evaluation includes Cardiac cath in February that showed no obstructive disease. Echo showed normal LV function with grade 2 diastolic dysfunction. On arrival to ED BNP level is high. Troponin is slightly elevated at 0.53. CXR is stable without edema. Ecg is unchanged. I think the troponin elevation is probably related to hypotension. I think it is very unlikely to be an ACS. BNP is chronically elevated and in setting of CKD I am not sure its significance. Repeat Echo ordered. Will cycle troponins. Unless there is significant increase I would not recommend further work up. I would reduce hydralazine to 25 mg bid and titrate down if no longer hypertensive. Will follow.   Peter Martinique, [place] 05/27/2014 4:53 PM

## 2014-05-27 NOTE — ED Notes (Addendum)
Susan Pittman from lab reports Critical Troponin - 0.53. Dr. Leonides Schanz and Lattie Haw EDPA made aware.

## 2014-05-27 NOTE — ED Notes (Signed)
Dr.Ward aware of pt's critical Trop. Lab value. ED-Lab

## 2014-05-27 NOTE — H&P (Signed)
History and Physical  Susan Pittman IYM:[id number] DOB: August 10, 1941 DOA: 05/27/2014   PCP: [name], MD   Chief Complaint: Weakness, low blood pressure  HPI:  73 year old female with a history of hypertension, renal artery stenosis, GERD, hypothyroidism, nonobstructive coronary artery disease and IgA nephropathy presents with dizziness, lightheadedness, and low blood pressure 30 minutes after taking her morning medications. On the day of admission, the patient was unable to get up out of her chair due to weakness and dizziness. The patient states that her symptoms have been worse over the past 2-3 days. The patient takes Bystolic and hydralazine in am. Losartan was recently d/ced by Dr. Lorrene Reid on 05/04/2014. Over the past 3 days, the patient was instructed to increase her furosemide dosing, although the patient was unable to tell me the exact doses and frequency. The dose of the furosemide was increased  due to dyspnea on exertion and fluid overload.  Apparently, the patient's weight in the office on 05/23/2014 was 235 pounds. At baseline, the patient takes furosemide 80 mg po bid.  since increasing her furosemide dosing, the patient has noted slight worsening of her dizziness. On the morning of admission, the patient's systolic blood pressure dropped into the 90s with associated shortness of breath and dizziness. She continues to have dyspnea on exertion, but denies any orthopnea, PND. She states that the lower extremity edema has worsened in the past month and has not significantly improved. In addition, the patient recently came back from a trip from [place] on 05/22/2014. The patient denies any fevers, chills, chest discomfort, nausea, vomiting, diarrhea, abdominal pain, dysuria,hematuria, hematochezia, melena. She denies any syncope, focal extremity weakness, or visual disturbance.  The patient was diagnosed with renal artery stenosis status post stenting by Dr. Gwenlyn Found on 11/08/2013. The  patient was subsequently hospitalized in February and underwent cardiac catheterization on 12/12/2013 secondary to chest pain. The catheterization showed nonobstructive CAD with ejection fraction 65%. She had a CT of the chest on 11/05/2013 which showed an ill-defined left lower lobe mass. She underwent VATS procedure performed by Dr. Servando Snare on 12/15/2013. This was converted to a minithoracotomy when the frozen section revealed adenocarcinoma. She subsequently underwent a LLL which resection on that day final pathology showing invasive adenocarcinoma. She subsequently saw Dr. Julien Nordmann in April 2015 whom recommended surveillance CT of the chest in 6 months. He did not feel that adjuvant chemotherapy would enhance her survival. The patient developed worsening renal insufficiency and proteinuria. She underwent a renal biopsy on 02/25/2014 which revealed IgA nephropathy. The patient was placed on prednisone by Dr. Lorrene Reid. Assessment/Plan:  elevated troponin -I am not convinced that the patient has acute coronary syndrome -This may be due to the patient's CKD -Continue to cycle troponins -EKG shows sinus rhythm, nonspecific ST changes -Cardiology has been consulted and has seen the patient  Dyspnea on exertion/Acute diastolic CHF -I believe the patient is clinically fluid overloaded with increasing peripheral edema and +hepatojugular reflex -Start IV lasix -obtain echocardiogram -suspect R-side heart failure -proBNP 10,297 -V/Q scan as pt has hx of lung adenocarcinoma and recent long drive -daily weights Hypertension -Continue hydralazine--please note that this has been changed to once daily by Dr. Lorrene Reid the day prior to admission -Continue Bystolic IgA nephropathy -continue prednisone--dose decreased from 100 to 12m daily on 05/25/14 -I have consulted nephrology--spoke with Dr. LAugustin CoupeCKD stage 3 -wide variations in pt's serum creatinine since Feb 2015 -Between May and June 2015--serum creatinine  1.8-2.0 Hypothyroidism -continue synthroid -  check TSH Prednisone induced hyperglycemia -HbA1c -novolog SSI      Past Medical History  Diagnosis Date  . Asthma   . Hypertension   . GERD (gastroesophageal reflux disease)   . Allergy   . Hypothyroid   . Osteoporosis   . Gastric ulcer   . Hiatal hernia   . Hyperlipidemia     "don't take RX for it" (11/08/2013)  . Renal artery stenosis   . Lung mass     superior segment LLL/notes 11/08/2013  . Pneumonia     "I've had it ?3 times" (11/08/2013)  . Arthritis     "knees, hips, back, hands" (11/08/2013)  . DJD (degenerative joint disease)   . Lung nodule/ adenoca > LLobectomy 12/15/13  11/08/2013    PET 11/23/2013 1. Dominant sub solid left lower lobe nodule does not demonstrate significantly increased metabolic activity. However, morphologically, adenocarcinoma is still a concern.   - Spirometry 11/24/13 wnl  - 11/24/2013 T surg eval rec> LLower lobectomy 12/15/2013 for adenoca   . CAD (coronary artery disease), non obstructive by cardiac cath 12/12/13 01/24/2014  . Basal cell carcinoma     "cut them off face & right shoulder" (11/08/2013)   Past Surgical History  Procedure Laterality Date  . Knee arthroscopy Bilateral   . Anterior cervical decomp/discectomy fusion  2000    C5-C6  . Renal artery stent Right 11/08/2013    Archie Endo 11/08/2013  . Tonsillectomy and adenoidectomy  ?1946  . Cholecystectomy  1970's  . Vaginal hysterectomy  1970  . Dilation and curettage of uterus  1960's  . Skin cancer excision    . Coronary angiogram      Hx: of 2/ 2015  . Cardiac catheterization      Hx: of 2/ 2015  . Video bronchoscopy N/A 12/15/2013    Procedure: VIDEO BRONCHOSCOPY;  Surgeon: Grace Isaac, MD;  Location: [facility] OR;  Service: Thoracic;  Laterality: N/A;  . Video assisted thoracoscopy (vats)/wedge resection Left 12/15/2013    Procedure: VIDEO ASSISTED THORACOSCOPY (VATS)/WEDGE RESECTION;  Surgeon: Grace Isaac, MD;  Location: [facility];  Service:  Thoracic;  Laterality: Left;  . Lobectomy Left 12/15/2013    Procedure: LOBECTOMY;  Surgeon: Grace Isaac, MD;  Location: [facility];  Service: Thoracic;  Laterality: Left;   Social History:  reports that she has never smoked. She has never used smokeless tobacco. She reports that she does not drink alcohol or use illicit drugs.   Family History  Problem Relation Age of Onset  . Breast cancer Sister   . Emphysema Father     smoked  . Allergies Sister   . Allergies Brother   . Heart disease Mother   . Heart disease Father   . Diabetes Father   . Diabetes Brother      Allergies  Allergen Reactions  . Benazepril Hcl Anaphylaxis and Cough  . Loratadine Anaphylaxis    anaphylaxis  . Celecoxib     REACTION: aching  . Lisinopril     REACTION: cough  . Allegra [Fexofenadine Hcl]     Severe back pain  . Sulfa Antibiotics Hives      Prior to Admission medications   Medication Sig Start Date End Date Taking? Authorizing Provider  albuterol (PROVENTIL HFA;VENTOLIN HFA) 108 (90 BASE) MCG/ACT inhaler Inhale 1-2 puffs into the lungs every 6 (six) hours as needed for wheezing or shortness of breath. 12/31/13  Yes Grace Isaac, MD  ALPRAZolam Duanne Moron) 0.5 MG tablet Take 0.5  mg by mouth at bedtime as needed for sleep.   Yes Historical Provider, MD  Calcium Carb-Cholecalciferol 600-800 MG-UNIT TABS Take 1 tablet by mouth daily.   Yes Historical Provider, MD  cetirizine (ZYRTEC) 10 MG tablet Take 10 mg by mouth at bedtime.    Yes Historical Provider, MD  DULERA 100-5 MCG/ACT AERO Inhale 2 puffs into the lungs 2 (two) times daily as needed for shortness of breath.  04/13/14  Yes Historical Provider, MD  famotidine (PEPCID) 20 MG tablet Take 20 mg by mouth 2 (two) times daily.   Yes Historical Provider, MD  fluticasone (FLONASE) 50 MCG/ACT nasal spray Place 2 sprays into both nostrils daily as needed for allergies or rhinitis.   Yes Historical Provider, MD  furosemide (LASIX) 80 MG tablet  Take 80 mg by mouth 2 (two) times daily.   Yes Historical Provider, MD  hydrALAZINE (APRESOLINE) 50 MG tablet Take 50 mg by mouth daily.   Yes Historical Provider, MD  levothyroxine (SYNTHROID, LEVOTHROID) 88 MCG tablet Take 88 mcg by mouth daily before breakfast.   Yes Historical Provider, MD  nebivolol (BYSTOLIC) 10 MG tablet Take 20 mg by mouth daily.   Yes Historical Provider, MD  pantoprazole (PROTONIX) 40 MG tablet Take 1 tablet (40 mg total) by mouth daily. Take 30-60 min before first meal of the day 04/22/14  Yes Tanda Rockers, MD  predniSONE (DELTASONE) 50 MG tablet Take 50 mg by mouth daily with breakfast.    Yes Historical Provider, MD  Blood Glucose Monitoring Suppl (ONE TOUCH ULTRA 2) W/DEVICE KIT 1 Device by Does not apply route once. 04/01/14   Renato Shin, MD  glucose blood (ONE TOUCH ULTRA TEST) test strip Pt to test 3-4x daily due to labile sugars Dx. 856.31 04/20/14   [name], MD  Lancets [facility] ULTRASOFT) lancets Use 1 per day dx code 249.00 04/19/14   Elayne Snare, MD  [facility] DELICA LANCETS 49F MISC Use to check blood sugar once daily as instructed. Dx code: 790.29 04/19/14   Renato Shin, MD    Review of Systems:  Constitutional:  No weight loss, night sweats, fatigue.  Head&Eyes: No headache.  No vision loss.  No eye pain or scotoma ENT:  No Difficulty swallowing,Tooth/dental problems,Sore throat,   Cardio-vascular:  No chest pain, Orthopnea, PND, swelling in lower extremities,   palpitations  GI:  No  abdominal pain, nausea, vomiting, diarrhea, loss of appetite, hematochezia, melena, heartburn, indigestion, Resp:   No coughing up of blood .No wheezing.No chest wall deformity  Skin:  no rash or lesions.  GU:  no dysuria, change in color of urine, no urgency or frequency. No flank pain.  Musculoskeletal:  No joint pain or swelling. No decreased range of motion. No back pain.  Psych:  No change in mood or affect.  Neurologic: No headache, no  dysesthesia, no focal weakness, no vision loss. No syncope  Physical Exam: Filed Vitals:   05/27/14 1119 05/27/14 1125  BP:  145/63  Pulse:  68  Temp:  99 F (37.2 C)  TempSrc:  Oral  Resp:  18  Height:  5' 5"  (1.651 m)  Weight:  101.152 kg (223 lb)  SpO2: 98% 98%   General:  A&O x 3, NAD, nontoxic, pleasant/cooperative Head/Eye: No conjunctival hemorrhage, no icterus, [place]/AT, No nystagmus ENT:  No icterus,  No thrush, good dentition, no pharyngeal exudate Neck:  No masses, no lymphadenpathy, no bruits CV:  RRR, no rub, no gallop, no S3 Lung:  CTAB, good air movement, no wheeze, no rhonchi Abdomen: soft/NT, +BS, nondistended, no peritoneal signs Ext: No cyanosis, No rashes, No petechiae, No lymphangitis, No edema Neuro: CNII-XII intact, strength 4/5 in bilateral upper and lower extremities, no dysmetria  Labs on Admission:  Basic Metabolic Panel:  Recent Labs Lab 05/27/14 1153  NA 139  K 4.1  CL 95*  CO2 28  GLUCOSE 209*  BUN 65*  CREATININE 1.84*  CALCIUM 8.4   Liver Function Tests: No results found for this basename: AST, ALT, ALKPHOS, BILITOT, PROT, ALBUMIN,  in the last 168 hours No results found for this basename: LIPASE, AMYLASE,  in the last 168 hours No results found for this basename: AMMONIA,  in the last 168 hours CBC:  Recent Labs Lab 05/27/14 1153  WBC 12.7*  NEUTROABS 11.5*  HGB 12.6  HCT 38.1  MCV 88.0  PLT 163   Cardiac Enzymes:  Recent Labs Lab 05/27/14 1153  TROPONINI 0.53*   BNP: No components found with this basename: POCBNP,  CBG:  Recent Labs Lab 05/27/14 1227  GLUCAP 168*    Radiological Exams on Admission: Dg Chest 2 View  05/27/2014   CLINICAL DATA:  Dizziness, low blood pressure  EXAM: CHEST  2 VIEW  COMPARISON:  04/12/2014, CT chest 02/17/2014  FINDINGS: There is a loculated left lower lung pleural effusion. There is no focal consolidation, pneumothorax or right pleural effusion. Stable cardiomediastinal silhouette.  Unremarkable osseous structures.  IMPRESSION: No acute cardiopulmonary disease. Loculated left lower pleural effusion.   Electronically Signed   By: Kathreen Devoid   On: 05/27/2014 12:17    EKG: Independently reviewed. Sinus rhythm with nonspecific ST changes    Time spent:70 minutes Code Status:   FULL Family Communication:   Husband at bedside   Payam Gribble, DO  [facility] Pager [phone]  If 7PM-7AM, please contact night-coverage [email or web address] Password [email or web address] 05/27/2014, 2:25 PM

## 2014-05-27 NOTE — Progress Notes (Signed)
Co-signed for [employer] RN/BSN for medication administration, assessments, Is&Os, Vital Signs, Progress Notes, Care Plans, and Patient education. Ollen Barges M RN/BSN

## 2014-05-27 NOTE — Progress Notes (Signed)
Report given to oncoming RN. Pt is stable, no complaints. Resting in bed. Oriented to room, and verbalized understanding of using call light prior to using restroom.

## 2014-05-27 NOTE — ED Notes (Signed)
Nuclear medicine contacted this RN and stated that the doses required for the scan have been ordered from an off-site facility and should be here by 1530.

## 2014-05-27 NOTE — Progress Notes (Signed)
Unit CM UR Completed by [facility] ED CM  W. Adel Burch RN  

## 2014-05-27 NOTE — ED Notes (Signed)
Pt presents from home via EMS c/o dropping BP approx 30 minutes after taking all of her morning meds x 3 weeks. Pt took medications about 0600. Pt states that it has gotten worse in the last 2 days. Pt had kidney stent placed in January and thinks that this may have something to do with the new symptoms. Pt also c/o SOB with exertion. Pt also reporting "prednisone induced diabetes." Pt has seen primary care for adjustments with no relief.

## 2014-05-28 DIAGNOSIS — I214 Non-ST elevation (NSTEMI) myocardial infarction: Secondary | ICD-10-CM

## 2014-05-28 DIAGNOSIS — I319 Disease of pericardium, unspecified: Secondary | ICD-10-CM

## 2014-05-28 LAB — GLUCOSE, CAPILLARY
Glucose-Capillary: 88 mg/dL (ref 70–99)
Glucose-Capillary: 153 mg/dL — ABNORMAL HIGH (ref 70–99)
Glucose-Capillary: 227 mg/dL — ABNORMAL HIGH (ref 70–99)
Glucose-Capillary: 237 mg/dL — ABNORMAL HIGH (ref 70–99)

## 2014-05-28 LAB — BASIC METABOLIC PANEL
ANION GAP: 15 (ref 5–15)
BUN: 59 mg/dL — AB (ref 6–23)
CHLORIDE: 98 meq/L (ref 96–112)
CO2: 29 meq/L (ref 19–32)
CREATININE: 1.57 mg/dL — AB (ref 0.50–1.10)
Calcium: 8.2 mg/dL — ABNORMAL LOW (ref 8.4–10.5)
GFR calc Af Amer: 36 mL/min — ABNORMAL LOW (ref >90)
GFR calc non Af Amer: 31 mL/min — ABNORMAL LOW (ref >90)
Glucose, Bld: 93 mg/dL (ref 70–99)
Potassium: 3.3 mEq/L — ABNORMAL LOW (ref 3.7–5.3)
Sodium: 142 mEq/L (ref 137–147)

## 2014-05-28 LAB — HEMOGLOBIN A1C
Hgb A1c MFr Bld: 7.4 % — ABNORMAL HIGH (ref <5.7)
MEAN PLASMA GLUCOSE: 166 mg/dL — AB (ref <117)

## 2014-05-28 LAB — TROPONIN I: Troponin I: 0.31 ng/mL (ref <0.30)

## 2014-05-28 NOTE — Progress Notes (Signed)
Echo Lab  2D Echocardiogram completed.  Baxter, RDCS 05/28/2014 11:28 AM

## 2014-05-28 NOTE — Progress Notes (Addendum)
PROGRESS NOTE  Susan Pittman QXI:[id number] DOB: 1941/05/16 DOA: 05/27/2014 PCP: [name], MD  Assessment/Plan: elevated troponin  -Secondary to the patient's renal failure -This may be due to the patient's CKD  -Continue to cycle troponins--trending down  -EKG shows sinus rhythm, nonspecific ST changes  -Cardiology has been consulted and has seen the patient  Dyspnea on exertion/Acute diastolic CHF  -I believe the patient is clinically fluid overloaded with increasing peripheral edema and +hepatojugular reflex  -Continue IV lasix bid--patient continues to appear clinically volume overloaded -obtain echocardiogram--pending  -suspect R-side heart failure  -proBNP 10,297  -V/Q scan as pt has hx of lung adenocarcinoma and recent long drive  -daily weights  -V/Q scan low probability Hypertension  -Continue hydralazine--please note that this has been changed to once daily by Dr. Lorrene Reid the day prior to admission  -Continue Bystolic--changed to evening dosing IgA nephropathy  -continue prednisone--dose decreased from 100 to 50mg  daily on 05/25/14  -appreciate nephrology followup CKD stage 3  -wide variations in pt's serum creatinine since Feb 2015  -Between May and June 2015--serum creatinine 1.8-2.0  Hypothyroidism  -continue synthroid  -check TSH--3.090  Prednisone induced hyperglycemia  -HbA1c--7.4 -novolog SSI   Family Communication:   husband at beside Disposition Plan:   Home when medically stable       Procedures/Studies: Dg Chest 2 View  05/27/2014   CLINICAL DATA:  Dizziness, low blood pressure  EXAM: CHEST  2 VIEW  COMPARISON:  04/12/2014, CT chest 02/17/2014  FINDINGS: There is a loculated left lower lung pleural effusion. There is no focal consolidation, pneumothorax or right pleural effusion. Stable cardiomediastinal silhouette. Unremarkable osseous structures.  IMPRESSION: No acute cardiopulmonary disease. Loculated left lower pleural  effusion.   Electronically Signed   By: Kathreen Devoid   On: 05/27/2014 12:17   Nm Pulmonary Perf And Vent  05/27/2014   CLINICAL DATA:  Shortness of breath  EXAM: NUCLEAR MEDICINE VENTILATION - PERFUSION LUNG SCAN  TECHNIQUE: Ventilation images were obtained in multiple projections using inhaled aerosol technetium 99 M DTPA. Perfusion images were obtained in multiple projections after intravenous injection of Tc-25m MAA.  RADIOPHARMACEUTICALS:  40 mCi Tc-59m DTPA aerosol and 6 mCi Tc-45m MAA  COMPARISON:  None.  FINDINGS: Ventilation: Slight decreased ventilation in the lingula. Central deposition of radiotracer as can be seen with lower airways disease. No other ventilatory defect. Relative hypoventilation of the left lung compared to the right.  Perfusion: No wedge shaped peripheral perfusion defects to suggest acute pulmonary embolism. Matching perfusion defect in the lingula with corresponding chest x-ray abnormality.  IMPRESSION: 1. Low probability for pulmonary embolus.   Electronically Signed   By: Kathreen Devoid   On: 05/27/2014 16:47         Subjective: Patient is doing better today. She denies any fevers, chills, chest discomfort, nausea, vomiting, diarrhea. She feels that her leg edema is better. Denies any abdominal pain  Objective: Filed Vitals:   05/27/14 2036 05/28/14 0139 05/28/14 0437 05/28/14 0958  BP: 131/74 152/73 164/71 128/58  Pulse:  51 64 58  Temp: 97.7 F (36.5 C) 97.9 F (36.6 C) 97.9 F (36.6 C) 98.1 F (36.7 C)  TempSrc: Oral Oral Oral Oral  Resp: 18 18 20 20   Height:      Weight:   101.651 kg (224 lb 1.6 oz)   SpO2: 97% 95% 100% 96%    Intake/Output Summary (Last 24 hours) at 05/28/14 1201 Last  data filed at 05/28/14 0900  Gross per 24 hour  Intake    243 ml  Output    800 ml  Net   -557 ml   Weight change:  Exam:   General:  Pt is alert, follows commands appropriately, not in acute distress  HEENT: No icterus, No thrush,  [place]/AT  Cardiovascular:  RRR, S1/S2, no rubs, no gallops  Respiratory: Diminished breath sounds bilaterally no wheezing. Good air movement  Abdomen: Soft/+BS, non tender, non distended, no guarding  Extremities: 2+LE edema, No lymphangitis, No petechiae, No rashes, no synovitis  Data Reviewed: Basic Metabolic Panel:  Recent Labs Lab 05/27/14 1153 05/28/14 0105  NA 139 142  K 4.1 3.3*  CL 95* 98  CO2 28 29  GLUCOSE 209* 93  BUN 65* 59*  CREATININE 1.84* 1.57*  CALCIUM 8.4 8.2*   Liver Function Tests: No results found for this basename: AST, ALT, ALKPHOS, BILITOT, PROT, ALBUMIN,  in the last 168 hours No results found for this basename: LIPASE, AMYLASE,  in the last 168 hours No results found for this basename: AMMONIA,  in the last 168 hours CBC:  Recent Labs Lab 05/27/14 1153  WBC 12.7*  NEUTROABS 11.5*  HGB 12.6  HCT 38.1  MCV 88.0  PLT 163   Cardiac Enzymes:  Recent Labs Lab 05/27/14 1153 05/27/14 1917 05/28/14 0105  TROPONINI 0.53* 0.47* 0.31*   BNP: No components found with this basename: POCBNP,  CBG:  Recent Labs Lab 05/27/14 1227 05/27/14 1846 05/27/14 2029 05/28/14 0649  GLUCAP 168* 245* 245* 88    No results found for this or any previous visit (from the past 240 hour(s)).   Scheduled Meds: . famotidine  20 mg Oral BID  . furosemide  80 mg Intravenous BID  . heparin  5,000 Units Subcutaneous 3 times per day  . hydrALAZINE  50 mg Oral Daily  . insulin aspart  0-15 Units Subcutaneous TID WC  . levothyroxine  88 mcg Oral QAC breakfast  . nebivolol  20 mg Oral Daily  . pantoprazole  40 mg Oral Q breakfast  . predniSONE  50 mg Oral Q breakfast  . sodium chloride  3 mL Intravenous Q12H   Continuous Infusions:    Nadean Montanaro, DO  [facility] Pager [phone]  If 7PM-7AM, please contact night-coverage [email or web address] Password [email or web address] 05/28/2014, 12:01 PM   LOS: 1 day

## 2014-05-28 NOTE — Progress Notes (Signed)
[facility] Progress Note   Assessment:  1. Dyspnea secondary to CHF in a patient with a positive hepatojugular reflux. 2. IgA nephropathy prednisone response with 8gm proteinuria decreasing to 2gm range within a month; steroids started in early May 2015. 3. LLL lung mass s/p LL lobectomy biopsy showing invasive adenocarcinoma --> no XRT or chemotherapy 4. Steroid induced diabetes (steroids recently decreased from 100mg ) 5. CKD III (?BL but appears to be ~1.7-2.0) 6. Hypertension 7. Hypothyroidism Plan  1. Continue Lasix 80mg  twice daily; may convert to PO.  2. Titrate down or discontinue the hydralazine if blood pressure is low.  3. Goal is to maximize fluid removal and once goal weight is achieved to restart an ARB (cough with ACE-I); difficult to uptitrate diuretics with ARB onboard in a patient w/ intermittent episodes of symptomatic hypotension.  4. Daily weights and strict I&O's to ensure steady diuresis.  5. Will monitor K closely while on IV Lasix. 6. She already has follow up with Dr. Lorrene Reid.   Subjective:   Feeling better but does still have dizziness. I instructed her to hold positions for a few seconds prior to moving. She was able to go to the restroom overnight and did not have any falls or syncopal episodes.  She denies dyspnea or chest pain. No other events overnight.   Objective:   BP 164/71  Pulse 64  Temp(Src) 97.9 F (36.6 C) (Oral)  Resp 20  Ht 5\' 5"  (1.651 m)  Wt 101.651 kg (224 lb 1.6 oz)  BMI 37.29 kg/m2  SpO2 100%  Intake/Output Summary (Last 24 hours) at 05/28/14 7672 Last data filed at 05/28/14 0437  Gross per 24 hour  Intake      3 ml  Output    800 ml  Net   -797 ml   Weight change:   Physical Exam: General appearance: alert, cooperative, no distress and moderately obese  Head: Normocephalic, without obvious abnormality, atraumatic  Resp: rales bibasilar  Chest wall: no tenderness  Cardio: regular rate and rhythm, S1, S2  normal, no murmur, click, rub or gallop  GI: soft, non-tender; bowel sounds normal; no masses, no organomegaly  Extremities: edema 2+  Neurologic: Grossly normal   Imaging: Dg Chest 2 View  05/27/2014   CLINICAL DATA:  Dizziness, low blood pressure  EXAM: CHEST  2 VIEW  COMPARISON:  04/12/2014, CT chest 02/17/2014  FINDINGS: There is a loculated left lower lung pleural effusion. There is no focal consolidation, pneumothorax or right pleural effusion. Stable cardiomediastinal silhouette. Unremarkable osseous structures.  IMPRESSION: No acute cardiopulmonary disease. Loculated left lower pleural effusion.   Electronically Signed   By: Kathreen Devoid   On: 05/27/2014 12:17   Nm Pulmonary Perf And Vent  05/27/2014   CLINICAL DATA:  Shortness of breath  EXAM: NUCLEAR MEDICINE VENTILATION - PERFUSION LUNG SCAN  TECHNIQUE: Ventilation images were obtained in multiple projections using inhaled aerosol technetium 99 M DTPA. Perfusion images were obtained in multiple projections after intravenous injection of Tc-26m MAA.  RADIOPHARMACEUTICALS:  40 mCi Tc-46m DTPA aerosol and 6 mCi Tc-82m MAA  COMPARISON:  None.  FINDINGS: Ventilation: Slight decreased ventilation in the lingula. Central deposition of radiotracer as can be seen with lower airways disease. No other ventilatory defect. Relative hypoventilation of the left lung compared to the right.  Perfusion: No wedge shaped peripheral perfusion defects to suggest acute pulmonary embolism. Matching perfusion defect in the lingula with corresponding chest x-ray abnormality.  IMPRESSION: 1. Low probability for pulmonary  embolus.   Electronically Signed   By: Kathreen Devoid   On: 05/27/2014 16:47    Labs: BMET  Recent Labs Lab 05/27/14 1153 05/28/14 0105  NA 139 142  K 4.1 3.3*  CL 95* 98  CO2 28 29  GLUCOSE 209* 93  BUN 65* 59*  CREATININE 1.84* 1.57*  CALCIUM 8.4 8.2*   CBC  Recent Labs Lab 05/27/14 1153  WBC 12.7*  NEUTROABS 11.5*  HGB 12.6   HCT 38.1  MCV 88.0  PLT 163    Medications:    . famotidine  20 mg Oral BID  . furosemide  80 mg Intravenous BID  . heparin  5,000 Units Subcutaneous 3 times per day  . hydrALAZINE  50 mg Oral Daily  . insulin aspart  0-15 Units Subcutaneous TID WC  . levothyroxine  88 mcg Oral QAC breakfast  . nebivolol  20 mg Oral Daily  . pantoprazole  40 mg Oral Q breakfast  . predniSONE  50 mg Oral Q breakfast  . sodium chloride  3 mL Intravenous Q12H      Otelia Santee, MD 05/28/2014, 9:42 AM

## 2014-05-29 LAB — BASIC METABOLIC PANEL
ANION GAP: 14 (ref 5–15)
BUN: 57 mg/dL — ABNORMAL HIGH (ref 6–23)
CHLORIDE: 97 meq/L (ref 96–112)
CO2: 31 meq/L (ref 19–32)
Calcium: 8.2 mg/dL — ABNORMAL LOW (ref 8.4–10.5)
Creatinine, Ser: 1.58 mg/dL — ABNORMAL HIGH (ref 0.50–1.10)
GFR calc non Af Amer: 31 mL/min — ABNORMAL LOW (ref >90)
GFR, EST AFRICAN AMERICAN: 36 mL/min — AB (ref >90)
Glucose, Bld: 120 mg/dL — ABNORMAL HIGH (ref 70–99)
Potassium: 3.2 mEq/L — ABNORMAL LOW (ref 3.7–5.3)
Sodium: 142 mEq/L (ref 137–147)

## 2014-05-29 LAB — GLUCOSE, CAPILLARY
GLUCOSE-CAPILLARY: 183 mg/dL — AB (ref 70–99)
Glucose-Capillary: 280 mg/dL — ABNORMAL HIGH (ref 70–99)
Glucose-Capillary: 202 mg/dL — ABNORMAL HIGH (ref 70–99)

## 2014-05-29 NOTE — Progress Notes (Signed)
PROGRESS NOTE  Susan Pittman:[id number] DOB: July 03, 1941 DOA: 05/27/2014 PCP: [name], MD  Assessment/Plan: elevated troponin  -Secondary to the patient's renal failure  -This may be due to the patient's CKD  -Continue to cycle troponins--trending down  -EKG shows sinus rhythm, nonspecific ST changes  -Cardiology has been consulted and has seen the patient--no further testing  Dyspnea on exertion/Acute diastolic CHF  -Multifactorial including acute diastolic CHF, deconditioning, and a component of OHS -I believe the patient is clinically fluid overloaded with increasing peripheral edema and +hepatojugular reflex although pt has lower extremity edema at baseline -Continue IV lasix bid--patient continues to appear clinically volume overloaded  -obtain echocardiogram--EF 81-19%, grade 1 diastolic dysfunction, small pericardial effusion -suspect R-side heart failure  -proBNP 10,297  -daily weights  -V/Q scan low probability  -neg 2.7L since admission Hypertension  -Continue hydralazine--please note that this has been changed to once daily by Dr. Lorrene Reid the day prior to admission  -Continue Bystolic--changed to evening dosing -no futher hypotensive episodes or dizziness  IgA nephropathy  -continue prednisone--dose decreased from 100 to 50mg  daily on 05/25/14  -appreciate nephrology followup  CKD stage 3  -wide variations in pt's serum creatinine since Feb 2015  -Between May and June 2015--serum creatinine 1.8-2.0  Hypothyroidism  -continue synthroid  -check TSH--3.090  Prednisone induced hyperglycemia  -HbA1c--7.4  -novolog SSI  Family Communication: husband at beside  Disposition Plan: Home when medically stable          Procedures/Studies: Dg Chest 2 View  05/27/2014   CLINICAL DATA:  Dizziness, low blood pressure  EXAM: CHEST  2 VIEW  COMPARISON:  04/12/2014, CT chest 02/17/2014  FINDINGS: There is a loculated left lower lung pleural effusion.  There is no focal consolidation, pneumothorax or right pleural effusion. Stable cardiomediastinal silhouette. Unremarkable osseous structures.  IMPRESSION: No acute cardiopulmonary disease. Loculated left lower pleural effusion.   Electronically Signed   By: Kathreen Devoid   On: 05/27/2014 12:17   Nm Pulmonary Perf And Vent  05/27/2014   CLINICAL DATA:  Shortness of breath  EXAM: NUCLEAR MEDICINE VENTILATION - PERFUSION LUNG SCAN  TECHNIQUE: Ventilation images were obtained in multiple projections using inhaled aerosol technetium 99 M DTPA. Perfusion images were obtained in multiple projections after intravenous injection of Tc-41m MAA.  RADIOPHARMACEUTICALS:  40 mCi Tc-21m DTPA aerosol and 6 mCi Tc-38m MAA  COMPARISON:  None.  FINDINGS: Ventilation: Slight decreased ventilation in the lingula. Central deposition of radiotracer as can be seen with lower airways disease. No other ventilatory defect. Relative hypoventilation of the left lung compared to the right.  Perfusion: No wedge shaped peripheral perfusion defects to suggest acute pulmonary embolism. Matching perfusion defect in the lingula with corresponding chest x-ray abnormality.  IMPRESSION: 1. Low probability for pulmonary embolus.   Electronically Signed   By: Kathreen Devoid   On: 05/27/2014 16:47         Subjective: Patient is feeling better. She is having less dyspnea. Denies any fevers, chills, chest discomfort, shortness breath, nausea, vomiting, diarrhea, abdominal pain.  Objective: Filed Vitals:   05/29/14 0126 05/29/14 0548 05/29/14 0949 05/29/14 1400  BP: 128/78 174/71 153/61 142/57  Pulse: 66 62    Temp: 97.9 F (36.6 C) 98 F (36.7 C)  98.2 F (36.8 C)  TempSrc: Oral Oral  Oral  Resp: 18 18  18   Height:      Weight:  101.243 kg (223 lb 3.2 oz)  SpO2: 99% 96%  95%    Intake/Output Summary (Last 24 hours) at 05/29/14 1826 Last data filed at 05/29/14 1300  Gross per 24 hour  Intake    240 ml  Output   1501 ml  Net   -1261 ml   Weight change: 0.091 kg (3.2 oz) Exam:   General:  Pt is alert, follows commands appropriately, not in acute distress  HEENT: No icterus, No thrush,  /AT  Cardiovascular: RRR, S1/S2, no rubs, no gallops  Respiratory: CTA bilaterally, no wheezing, no crackles, no rhonchi  Abdomen: Soft/+BS, non tender, non distended, no guarding  Extremities: 2+LE edema, No lymphangitis, No petechiae, No rashes, no synovitis  Data Reviewed: Basic Metabolic Panel:  Recent Labs Lab 05/27/14 1153 05/28/14 0105 05/29/14 0330  NA 139 142 142  K 4.1 3.3* 3.2*  CL 95* 98 97  CO2 28 29 31   GLUCOSE 209* 93 120*  BUN 65* 59* 57*  CREATININE 1.84* 1.57* 1.58*  CALCIUM 8.4 8.2* 8.2*   Liver Function Tests: No results found for this basename: AST, ALT, ALKPHOS, BILITOT, PROT, ALBUMIN,  in the last 168 hours No results found for this basename: LIPASE, AMYLASE,  in the last 168 hours No results found for this basename: AMMONIA,  in the last 168 hours CBC:  Recent Labs Lab 05/27/14 1153  WBC 12.7*  NEUTROABS 11.5*  HGB 12.6  HCT 38.1  MCV 88.0  PLT 163   Cardiac Enzymes:  Recent Labs Lab 05/27/14 1153 05/27/14 1917 05/28/14 0105  TROPONINI 0.53* 0.47* 0.31*   BNP: No components found with this basename: POCBNP,  CBG:  Recent Labs Lab 05/28/14 1159 05/28/14 1636 05/28/14 2058 05/29/14 1113 05/29/14 1636  GLUCAP 153* 227* 237* 183* 280*    No results found for this or any previous visit (from the past 240 hour(s)).   Scheduled Meds: . famotidine  20 mg Oral BID  . furosemide  80 mg Intravenous BID  . heparin  5,000 Units Subcutaneous 3 times per day  . hydrALAZINE  50 mg Oral Daily  . insulin aspart  0-15 Units Subcutaneous TID WC  . levothyroxine  88 mcg Oral QAC breakfast  . nebivolol  20 mg Oral Daily  . pantoprazole  40 mg Oral Q breakfast  . predniSONE  50 mg Oral Q breakfast  . sodium chloride  3 mL Intravenous Q12H   Continuous Infusions:     Chaunda Vandergriff, DO  [facility] Pager [phone]  If 7PM-7AM, please contact night-coverage [email or web address] Password [facility] 05/29/2014, 6:26 PM   LOS: 2 days

## 2014-05-29 NOTE — Progress Notes (Signed)
[facility] Progress Note   Assessment:  1. Dyspnea secondary to CHF in a patient with a positive hepatojugular reflux. 2. IgA nephropathy prednisone response with 8gm proteinuria decreasing to 2gm range within a month; steroids started in early May 2015. 3. LLL lung mass s/p LL lobectomy biopsy showing invasive adenocarcinoma --> no XRT or chemotherapy 4. Steroid induced diabetes (steroids recently decreased from 100mg ) 5. CKD III (?BL but appears to be ~1.7-2.0) 6. Hypertension 7. Hypothyroidism Plan  1. Continue Lasix 80mg  twice daily. -- may convert to PO when close to discharge.  2. Titrate down or discontinue the hydralazine if blood pressure is low.  3. Goal is to maximize fluid removal and once goal weight is achieved to restart an ARB (cough with ACE-I); difficult to uptitrate diuretics with ARB onboard in a patient w/ intermittent episodes of symptomatic hypotension.  4. Daily weights and strict I&O's to ensure steady diuresis.  5. Will monitor K closely while on IV Lasix.  6. She already has follow up with Dr. Lorrene Reid.    Subjective:   Feeling better but does still have dizziness. I instructed her to hold positions for a few seconds prior to moving. She was able to go to the restroom overnight and did not have any falls or syncopal episodes.  She denies dyspnea or chest pain.   BP fairly consistently elevated in the AM before meds but then better controlled throughout the day.  No other events overnight.     Objective:   BP 174/71  Pulse 62  Temp(Src) 98 F (36.7 C) (Oral)  Resp 18  Ht 5\' 5"  (1.651 m)  Wt 101.243 kg (223 lb 3.2 oz)  BMI 37.14 kg/m2  SpO2 96%  Intake/Output Summary (Last 24 hours) at 05/29/14 1610 Last data filed at 05/29/14 9604  Gross per 24 hour  Intake    480 ml  Output   2402 ml  Net  -1922 ml   Weight change: 0.091 kg (3.2 oz)  Physical Exam: General appearance: alert, cooperative, no distress and moderately obese   Head: Normocephalic, without obvious abnormality, atraumatic  Resp: rales bibasilar  Chest wall: no tenderness  Cardio: regular rate and rhythm, S1, S2 normal, no murmur, click, rub or gallop  GI: soft, non-tender; bowel sounds normal; no masses, no organomegaly  Extremities: edema 2+  Neurologic: Grossly normal   Imaging: Dg Chest 2 View  05/27/2014   CLINICAL DATA:  Dizziness, low blood pressure  EXAM: CHEST  2 VIEW  COMPARISON:  04/12/2014, CT chest 02/17/2014  FINDINGS: There is a loculated left lower lung pleural effusion. There is no focal consolidation, pneumothorax or right pleural effusion. Stable cardiomediastinal silhouette. Unremarkable osseous structures.  IMPRESSION: No acute cardiopulmonary disease. Loculated left lower pleural effusion.   Electronically Signed   By: Kathreen Devoid   On: 05/27/2014 12:17   Nm Pulmonary Perf And Vent  05/27/2014   CLINICAL DATA:  Shortness of breath  EXAM: NUCLEAR MEDICINE VENTILATION - PERFUSION LUNG SCAN  TECHNIQUE: Ventilation images were obtained in multiple projections using inhaled aerosol technetium 99 M DTPA. Perfusion images were obtained in multiple projections after intravenous injection of Tc-3m MAA.  RADIOPHARMACEUTICALS:  40 mCi Tc-1m DTPA aerosol and 6 mCi Tc-4m MAA  COMPARISON:  None.  FINDINGS: Ventilation: Slight decreased ventilation in the lingula. Central deposition of radiotracer as can be seen with lower airways disease. No other ventilatory defect. Relative hypoventilation of the left lung compared to the right.  Perfusion: No wedge  shaped peripheral perfusion defects to suggest acute pulmonary embolism. Matching perfusion defect in the lingula with corresponding chest x-ray abnormality.  IMPRESSION: 1. Low probability for pulmonary embolus.   Electronically Signed   By: Kathreen Devoid   On: 05/27/2014 16:47    Labs: BMET  Recent Labs Lab 05/27/14 1153 05/28/14 0105 05/29/14 0330  NA 139 142 142  K 4.1 3.3* 3.2*  CL  95* 98 97  CO2 28 29 31   GLUCOSE 209* 93 120*  BUN 65* 59* 57*  CREATININE 1.84* 1.57* 1.58*  CALCIUM 8.4 8.2* 8.2*   CBC  Recent Labs Lab 05/27/14 1153  WBC 12.7*  NEUTROABS 11.5*  HGB 12.6  HCT 38.1  MCV 88.0  PLT 163    Medications:    . famotidine  20 mg Oral BID  . furosemide  80 mg Intravenous BID  . heparin  5,000 Units Subcutaneous 3 times per day  . hydrALAZINE  50 mg Oral Daily  . insulin aspart  0-15 Units Subcutaneous TID WC  . levothyroxine  88 mcg Oral QAC breakfast  . nebivolol  20 mg Oral Daily  . pantoprazole  40 mg Oral Q breakfast  . predniSONE  50 mg Oral Q breakfast  . sodium chloride  3 mL Intravenous Q12H      Otelia Santee, MD 05/29/2014, 8:14 AM

## 2014-05-30 DIAGNOSIS — I10 Essential (primary) hypertension: Secondary | ICD-10-CM

## 2014-05-30 LAB — BASIC METABOLIC PANEL
Anion gap: 13 (ref 5–15)
BUN: 54 mg/dL — AB (ref 6–23)
CO2: 32 meq/L (ref 19–32)
CREATININE: 1.54 mg/dL — AB (ref 0.50–1.10)
Calcium: 8.4 mg/dL (ref 8.4–10.5)
Chloride: 99 mEq/L (ref 96–112)
GFR calc Af Amer: 37 mL/min — ABNORMAL LOW (ref >90)
GFR calc non Af Amer: 32 mL/min — ABNORMAL LOW (ref >90)
Glucose, Bld: 118 mg/dL — ABNORMAL HIGH (ref 70–99)
Potassium: 3.4 mEq/L — ABNORMAL LOW (ref 3.7–5.3)
Sodium: 144 mEq/L (ref 137–147)

## 2014-05-30 LAB — GLUCOSE, CAPILLARY
Glucose-Capillary: 109 mg/dL — ABNORMAL HIGH (ref 70–99)
Glucose-Capillary: 279 mg/dL — ABNORMAL HIGH (ref 70–99)

## 2014-05-30 LAB — PRO B NATRIURETIC PEPTIDE: Pro B Natriuretic peptide (BNP): 10599 pg/mL — ABNORMAL HIGH (ref 0–450)

## 2014-05-30 MED ORDER — FUROSEMIDE 80 MG PO TABS: 80.0000 mg | ORAL_TABLET | Freq: Two times a day (BID) | ORAL | Status: DC

## 2014-05-30 MED ORDER — POTASSIUM CHLORIDE CRYS ER 20 MEQ PO TBCR
40.0000 meq | EXTENDED_RELEASE_TABLET | Freq: Once | ORAL | Status: AC
  Administered 2014-05-30: 40 meq via ORAL
  Filled 2014-05-30: qty 2

## 2014-05-30 MED ORDER — UNABLE TO FIND: Status: DC

## 2014-05-30 NOTE — Progress Notes (Signed)
UR completed Karmin Kasprzak K. Sherryn Pollino, RN, BSN, [place], CCM  05/30/2014 2:44 PM

## 2014-05-30 NOTE — Progress Notes (Signed)
Patient Name: Susan Pittman Date of Encounter: 05/30/2014  Active Problems:   HYPOTHYROIDISM   HYPERTENSION   Acute diastolic HF (heart failure)   CAD (coronary artery disease), non obstructive by cardiac cath 12/12/13   DOE (dyspnea on exertion)   Acute diastolic CHF (congestive heart failure)   CKD (chronic kidney disease) stage 3, GFR 30-59 ml/min   IgA nephropathy    Patient Profile: 73 yo female w/ hx CKD, HTN, Lung CA, and non-obs CAD by cath 12/2013, was admitted 07/24 w/ SOB. Cards consult for elevated troponin.    SUBJECTIVE: Breathing much better, feels ready for d/c, says dry weight is 221 lbs, weighs 224 in-hosp today. No chest pain. Says legs swell quickly when down.  OBJECTIVE Filed Vitals:   05/29/14 0949 05/29/14 1400 05/29/14 2142 05/30/14 0700  BP: 153/61 142/57 157/77 153/78  Pulse:   70 66  Temp:  98.2 F (36.8 C) 97.9 F (36.6 C) 97.6 F (36.4 C)  TempSrc:  Oral Oral Oral  Resp:  18 18 18   Height:      Weight:    224 lb 1.6 oz (101.651 kg)  SpO2:  95% 100% 100%    Intake/Output Summary (Last 24 hours) at 05/30/14 0802 Last data filed at 05/30/14 0211  Gross per 24 hour  Intake    480 ml  Output   1200 ml  Net   -720 ml   Filed Weights   05/28/14 0437 05/29/14 0548 05/30/14 0700  Weight: 224 lb 1.6 oz (101.651 kg) 223 lb 3.2 oz (101.243 kg) 224 lb 1.6 oz (101.651 kg)    PHYSICAL EXAM General: Well developed, well nourished, female in no acute distress. Head: Normocephalic, atraumatic.  Neck: Supple without bruits, JVD minimal elevation. Lungs:  Resp regular and unlabored, few rales bases. Heart: RRR, S1, S2, no S3, S4, or murmur; no rub. Abdomen: Soft, non-tender, non-distended, BS + x 4.  Extremities: No clubbing, cyanosis, 1+ pedal edema.  Neuro: Alert and oriented X 3. Moves all extremities spontaneously. Psych: Normal affect.  LABS: CBC: Recent Labs  05/27/14 1153  WBC 12.7*  NEUTROABS 11.5*  HGB 12.6  HCT 38.1  MCV  88.0  PLT 384   Basic Metabolic Panel: Recent Labs  05/28/14 0105 05/29/14 0330  NA 142 142  K 3.3* 3.2*  CL 98 97  CO2 29 31  GLUCOSE 93 120*  BUN 59* 57*  CREATININE 1.57* 1.58*  CALCIUM 8.2* 8.2*   Cardiac Enzymes: Recent Labs  05/27/14 1153 05/27/14 1917 05/28/14 0105  TROPONINI 0.53* 0.47* 0.31*    Recent Labs  05/27/14 1159  TROPIPOC 0.41*   BNP: Pro B Natriuretic peptide (BNP)  Date/Time Value Ref Range Status  05/27/2014 11:53 AM 10297.0* 0 - 450 pg/mL Final  04/12/2014  3:27 PM 2946.0* 0 - 450 pg/mL Final   Hemoglobin A1C: Recent Labs  05/27/14 1917  HGBA1C 7.4*   Thyroid Function Tests: Recent Labs  05/27/14 1917  TSH 3.090   TELE:  SR, no significant ectopy     ECHO:  05/28/2014 - Left ventricle: The cavity size was normal. Wall thickness was increased in a pattern of mild LVH. Systolic function was normal. The estimated ejection fraction was in the range of 55% to 60%. Wall motion was normal; there were no regional wall motion abnormalities. Doppler parameters are consistent with abnormal left ventricular relaxation (grade 1 diastolic dysfunction). - Left atrium: The atrium was mildly dilated. - Pericardium, extracardiac: A small  pericardial effusion was identified. There was mildright atrial chamber collapse. Impressions: - Normal LV function; small pericardial effusion.  Radiology/Studies: Dg Chest 2 View 05/27/2014   CLINICAL DATA:  Dizziness, low blood pressure  EXAM: CHEST  2 VIEW  COMPARISON:  04/12/2014, CT chest 02/17/2014  FINDINGS: There is a loculated left lower lung pleural effusion. There is no focal consolidation, pneumothorax or right pleural effusion. Stable cardiomediastinal silhouette. Unremarkable osseous structures.  IMPRESSION: No acute cardiopulmonary disease. Loculated left lower pleural effusion.   Electronically Signed   By: Kathreen Devoid   On: 05/27/2014 12:17   Nm Pulmonary Perf And Vent 05/27/2014   CLINICAL DATA:   Shortness of breath  EXAM: NUCLEAR MEDICINE VENTILATION - PERFUSION LUNG SCAN  TECHNIQUE: Ventilation images were obtained in multiple projections using inhaled aerosol technetium 99 M DTPA. Perfusion images were obtained in multiple projections after intravenous injection of Tc-65m MAA.  RADIOPHARMACEUTICALS:  40 mCi Tc-23m DTPA aerosol and 6 mCi Tc-64m MAA  COMPARISON:  None.  FINDINGS: Ventilation: Slight decreased ventilation in the lingula. Central deposition of radiotracer as can be seen with lower airways disease. No other ventilatory defect. Relative hypoventilation of the left lung compared to the right.  Perfusion: No wedge shaped peripheral perfusion defects to suggest acute pulmonary embolism. Matching perfusion defect in the lingula with corresponding chest x-ray abnormality.  IMPRESSION: 1. Low probability for pulmonary embolus.   Electronically Signed   By: Kathreen Devoid   On: 05/27/2014 16:47    Current Medications:  . famotidine  20 mg Oral BID  . furosemide  80 mg Intravenous BID  . heparin  5,000 Units Subcutaneous 3 times per day  . hydrALAZINE  50 mg Oral Daily  . insulin aspart  0-15 Units Subcutaneous TID WC  . levothyroxine  88 mcg Oral QAC breakfast  . nebivolol  20 mg Oral Daily  . pantoprazole  40 mg Oral Q breakfast  . predniSONE  50 mg Oral Q breakfast  . sodium chloride  3 mL Intravenous Q12H      ASSESSMENT AND PLAN: Active Problems:   HYPOTHYROIDISM - per IM    HYPERTENSION - per IM    CAD (coronary artery disease), non obstructive by cardiac cath 12/12/13 - continue BB, not on ASA or statin.    DOE (dyspnea on exertion) - per IM    Acute on chronic diastolic CHF (congestive heart failure) - PAS not listed by echo, but she is very close to/at dry weight. Management per primary MD. At d/c, encouraged daily weights and low sodium diet after d/c     CKD (chronic kidney disease) stage 3, GFR 30-59 ml/min - per IM, OP diuretics were managed by nephrology    IgA  nephropathy - per IM    Elevated troponin - Most likely secondary to volume overload and SOB, Non-obs dz by recent cath. EF normal by echo.  No further cardiac workup, will arrange OP FU appt.  Signed, Rosaria Ferries , PA-C 8:02 AM 05/30/2014  I have examined the patient and reviewed assessment and plan and discussed with patient.  Agree with above as stated.  Appears euvolemic.  May benefit from outpatient stress test.  She had a stress test many years ago.  Would not pursue diagnostic cath despite troponins now due to renal insufficiency, lack of angina and normal wall motion by echo.  F/u with Dr. Fletcher Anon.  Will sign off.   Lyndsy Gilberto S.

## 2014-05-30 NOTE — Progress Notes (Signed)
[facility] ROUNDING NOTE   Subjective:   Interval History:no complaints this AM  Objective:  Vital signs in last 24 hours:  Temp:  [97.6 F (36.4 C)-98.2 F (36.8 C)] 97.6 F (36.4 C) (07/27 0700) Pulse Rate:  [66-70] 66 (07/27 0700) Resp:  [18] 18 (07/27 0700) BP: (142-157)/(57-78) 153/78 mmHg (07/27 0700) SpO2:  [95 %-100 %] 100 % (07/27 0700) Weight:  [101.651 kg (224 lb 1.6 oz)] 101.651 kg (224 lb 1.6 oz) (07/27 0700)  Weight change: 0.408 kg (14.4 oz) Filed Weights   05/28/14 0437 05/29/14 0548 05/30/14 0700  Weight: 101.651 kg (224 lb 1.6 oz) 101.243 kg (223 lb 3.2 oz) 101.651 kg (224 lb 1.6 oz)    Intake/Output: I/O last 3 completed shifts: In: 480 [P.O.:480] Out: 2100 [Urine:2100]   Intake/Output this shift:  Total I/O In: 3 [I.V.:3] Out: 150 [Urine:150]  Moon face and thin bruised skin CVS- RRR RS- CTA ABD- BS present soft non-distended EXT- no edema   Basic Metabolic Panel:  Recent Labs Lab 05/27/14 1153 05/28/14 0105 05/29/14 0330 05/30/14 0614  NA 139 142 142 144  K 4.1 3.3* 3.2* 3.4*  CL 95* 98 97 99  CO2 28 29 31  32  GLUCOSE 209* 93 120* 118*  BUN 65* 59* 57* 54*  CREATININE 1.84* 1.57* 1.58* 1.54*  CALCIUM 8.4 8.2* 8.2* 8.4    Liver Function Tests: No results found for this basename: AST, ALT, ALKPHOS, BILITOT, PROT, ALBUMIN,  in the last 168 hours No results found for this basename: LIPASE, AMYLASE,  in the last 168 hours No results found for this basename: AMMONIA,  in the last 168 hours  CBC:  Recent Labs Lab 05/27/14 1153  WBC 12.7*  NEUTROABS 11.5*  HGB 12.6  HCT 38.1  MCV 88.0  PLT 163    Cardiac Enzymes:  Recent Labs Lab 05/27/14 1153 05/27/14 1917 05/28/14 0105  TROPONINI 0.53* 0.47* 0.31*    BNP: No components found with this basename: POCBNP,   CBG:  Recent Labs Lab 05/28/14 2058 05/29/14 1113 05/29/14 1636 05/29/14 2140 05/30/14 0717  GLUCAP 237* 183* 280* 202* 109*     Microbiology: Results for orders placed in visit on 12/28/13  WOUND CULTURE     Status: None   Collection Time    12/28/13  3:30 PM      Result Value Ref Range Status   Culture Few STAPHYLOCOCCUS AUREUS   Final   Gram Stain Moderate   Final   Gram Stain WBC present-predominately PMN   Final   Gram Stain No Squamous Epithelial Cells Seen   Final   Gram Stain Rare GRAM POSITIVE COCCI IN CLUSTERS   Final   Organism ID, Bacteria STAPHYLOCOCCUS AUREUS   Final   Comment: Rifampin and Gentamicin should not be used as     single drugs for treatment of Staph infections.    Coagulation Studies: No results found for this basename: LABPROT, INR,  in the last 72 hours  Urinalysis:  Recent Labs  05/27/14 1240  COLORURINE YELLOW  LABSPEC 1.014  PHURINE 5.0  GLUCOSEU NEGATIVE  HGBUR SMALL*  BILIRUBINUR NEGATIVE  KETONESUR NEGATIVE  PROTEINUR 100*  UROBILINOGEN 0.2  NITRITE NEGATIVE  LEUKOCYTESUR SMALL*      Imaging: No results found.   Medications:     . famotidine  20 mg Oral BID  . furosemide  80 mg Intravenous BID  . heparin  5,000 Units Subcutaneous 3 times per day  . hydrALAZINE  50  mg Oral Daily  . insulin aspart  0-15 Units Subcutaneous TID WC  . levothyroxine  88 mcg Oral QAC breakfast  . nebivolol  20 mg Oral Daily  . pantoprazole  40 mg Oral Q breakfast  . potassium chloride  40 mEq Oral Once  . predniSONE  50 mg Oral Q breakfast  . sodium chloride  3 mL Intravenous Q12H   sodium chloride, acetaminophen, ALPRAZolam, fluticasone, ondansetron (ZOFRAN) IV, sodium chloride  Assessment/ Plan:    1. Ig A nephropathy with response to prednisone treated with steroids    2. HTN/VOL-Continue Lasix 80mg  twice daily  Good urine output  Will sign off if OK with primary service   LOS: 3 Susan Pittman W @TODAY @10 :55 AM

## 2014-05-30 NOTE — Discharge Summary (Signed)
Physician Discharge Summary  KYRIE BUN WUJ:[id number] DOB: 11/04/1941 DOA: 05/27/2014  PCP: [name], MD  Admit date: 05/27/2014 Discharge date: 05/30/2014  Recommendations for Outpatient Follow-up:  1. Pt will need to follow up with PCP in 2 weeks post discharge 2. Please obtain BMP 5-7 days   Discharge Diagnoses:  elevated troponin  -Secondary to the patient's renal failure  -This may be due to the patient's CKD  -Continue to cycle troponins--trending down  -EKG shows sinus rhythm, nonspecific ST changes  -Cardiology has been consulted and has seen the patient--no further testing in hospital--followup with Dr. Fletcher Anon 06/16/14 Dyspnea on exertion/Acute diastolic CHF  -Multifactorial including acute diastolic CHF, deconditioning, and a component of OHS  -I believe the patient was clinically fluid overloaded with increasing peripheral edema and +hepatojugular reflex although pt has lower extremity edema at baseline  -Continue IV lasix bid-->po lasix 63m bid -obtain echocardiogram--EF 595-62% grade 1 diastolic dysfunction, small pericardial effusion  -suspect R-side heart failure  -proBNP 10,297  -daily weights--call PCP or nephrology if gain 3 or more pounds on successive days or >5 pounds over 3 days  -V/Q scan low probability  -neg 3.5L since admission  Hypertension  -Continue hydralazine--please note that this has been changed to once daily by Dr. DLorrene Reidthe day prior to admission--take in morning  -Continue Bystolic--changed to evening dosing  -no futher hypotensive episodes or dizziness  IgA nephropathy  -continue prednisone--dose decreased from 100 to 54mdaily on 05/25/14  -appreciate nephrology followup  -Patient has a followup appointment with Dr. CyJamal Maesn 06/16/2014 CKD stage 3  -wide variations in pt's serum creatinine since Feb 2015  -Between May and June 2015--serum creatinine 1.8-2.0  -Serum creatinine 1.54 on the day of  discharge Hypothyroidism  -continue synthroid  -check TSH--3.090  Prednisone induced hyperglycemia  -HbA1c--7.4  -novolog SSI  -I expect her CBGs will continue to improve as the patient was weaned from her prednisone -The patient will need to follow up with her primary care provider For further discussion of starting her on any hypoglycemic agents-at this time, the patient did not want to be started on any medications pending discussion with her primary care provider   Discharge Condition:  Stable Disposition:  Follow-up Information   Follow up with ScRichardson DoppPA-C On 06/16/2014. (See for Dr. ArFletcher Anont 2:20 pm)    Specialty:  Physician Assistant   Contact information:   [id number] [id number]     Follow up with DULucrezia StarchMD. (06/13/14)    Specialty:  Nephrology   Contact information:   [address] [place] [phone]     Diet: Low-sodium Wt Readings from Last 3 Encounters:  05/30/14 101.651 kg (224 lb 1.6 oz)  05/03/14 102.114 kg (225 lb 1.9 oz)  04/22/14 104.327 kg (230 lb)    History of present illness:   [age]ear old female with a history of hypertension, renal artery stenosis, GERD, hypothyroidism, nonobstructive coronary artery disease and IgA nephropathy presents with dizziness, lightheadedness, and low blood pressure 30 minutes after taking her morning medications. On the day of admission, the patient was unable to get up out of her chair due to weakness and dizziness. The patient states that her symptoms have been worse over the past 2-3 days. The patient takes Bystolic and hydralazine in am. Losartan was recently d/ced by Dr. DuLorrene Reidn 05/04/2014. Over the past 3 days, the patient was instructed to increase  her furosemide dosing, although the patient was unable to tell me the exact doses and frequency. The dose of the furosemide was increased due to dyspnea on exertion and fluid overload.  Apparently, the patient's weight in the office on 05/23/2014 was 235 pounds. At baseline, the patient takes furosemide 80 mg po bid. since increasing her furosemide dosing, the patient has noted slight worsening of her dizziness. On the morning of admission, the patient's systolic blood pressure dropped into the 90s with associated shortness of breath and dizziness. She continues to have dyspnea on exertion, but denies any orthopnea, PND. She states that the lower extremity edema has worsened in the past month and has not significantly improved. In addition, the patient recently came back from a trip from [place] on 05/22/2014. The patient denies any fevers, chills, chest discomfort, nausea, vomiting, diarrhea, abdominal pain, dysuria,hematuria, hematochezia, melena. She denies any syncope, focal extremity weakness, or visual disturbance.  The patient was diagnosed with renal artery stenosis status post stenting by Dr. Gwenlyn Found on 11/08/2013. The patient was subsequently hospitalized in February and underwent cardiac catheterization on 12/12/2013 secondary to chest pain. The catheterization showed nonobstructive CAD with ejection fraction 65%. She had a CT of the chest on 11/05/2013 which showed an ill-defined left lower lobe mass. She underwent VATS procedure performed by Dr. Servando Snare on 12/15/2013. This was converted to a minithoracotomy when the frozen section revealed adenocarcinoma. She subsequently underwent a LLL which resection on that day final pathology showing invasive adenocarcinoma. She subsequently saw Dr. Julien Nordmann in April 2015 whom recommended surveillance CT of the chest in 6 months. He did not feel that adjuvant chemotherapy would enhance her survival. The patient developed worsening renal insufficiency and proteinuria. She underwent a renal biopsy on 02/25/2014 which revealed IgA nephropathy. The patient was placed on prednisone by Dr. Lorrene Reid.  The patient was started on intravenous furosemide  21m IV bid with good clinical response. The patient's dyspnea improved. The patient was -3.5 L during admission. She was followed by nephrology and cardiology. Cardiology did not recommend any further inpatient workup. Nephrology recommended to discharge the patient on furosemide 80 mg po twice a day. She already has a followup with Dr. CJamal Maes  Her bystolic dose was changed to pm--after which the patient did not have any further hypotensive episodes or dizziness.   Consultants: [facility]  Discharge Exam: Filed Vitals:   05/30/14 1108  BP: 158/68  Pulse:   Temp:   Resp:    Filed Vitals:   05/29/14 1400 05/29/14 2142 05/30/14 0700 05/30/14 1108  BP: 142/57 157/77 153/78 158/68  Pulse:  70 66   Temp: 98.2 F (36.8 C) 97.9 F (36.6 C) 97.6 F (36.4 C)   TempSrc: Oral Oral Oral   Resp: 18 18 18    Height:      Weight:   101.651 kg (224 lb 1.6 oz)   SpO2: 95% 100% 100%    General: A&O x 3, NAD, pleasant, cooperative Cardiovascular: RRR, no rub, no gallop, no S3 Respiratory: CTAB, no wheeze, no rhonchi Abdomen:soft, nontender, nondistended, positive bowel sounds Extremities: 2+LE edema, No lymphangitis, no petechiae  Discharge Instructions      Discharge Instructions   Diet - low sodium heart healthy    Complete by:  As directed      Increase activity slowly    Complete by:  As directed             Medication List    STOP taking these medications  valsartan 80 MG tablet  Commonly known as:  DIOVAN      TAKE these medications       albuterol 108 (90 BASE) MCG/ACT inhaler  Commonly known as:  PROVENTIL HFA;VENTOLIN HFA  Inhale 1-2 puffs into the lungs every 6 (six) hours as needed for wheezing or shortness of breath.     ALPRAZolam 0.5 MG tablet  Commonly known as:  XANAX  Take 0.5 mg by mouth at bedtime as needed for sleep.     Calcium Carb-Cholecalciferol 600-800 MG-UNIT Tabs  Take 1 tablet by mouth daily.     DULERA 100-5  MCG/ACT Aero  Generic drug:  mometasone-formoterol  Inhale 2 puffs into the lungs 2 (two) times daily as needed for shortness of breath.     famotidine 20 MG tablet  Commonly known as:  PEPCID  Take 20 mg by mouth 2 (two) times daily.     fluticasone 50 MCG/ACT nasal spray  Commonly known as:  FLONASE  Place 2 sprays into both nostrils daily as needed for allergies or rhinitis.     furosemide 80 MG tablet  Commonly known as:  LASIX  Take 1 tablet (80 mg total) by mouth 2 (two) times daily.     glucose blood test strip  Commonly known as:  ONE TOUCH ULTRA TEST  Pt to test 3-4x daily due to labile sugars Dx. 250.02     hydrALAZINE 50 MG tablet  Commonly known as:  APRESOLINE  Take 50 mg by mouth daily.     levothyroxine 88 MCG tablet  Commonly known as:  SYNTHROID, LEVOTHROID  Take 88 mcg by mouth daily before breakfast.     nebivolol 10 MG tablet  Commonly known as:  BYSTOLIC  Take 20 mg by mouth daily.     ONE TOUCH ULTRA 2 W/DEVICE Kit  1 Device by Does not apply route once.     onetouch ultrasoft lancets  Use 1 per day dx code 431.54     ONETOUCH DELICA LANCETS 00Q Misc  Use to check blood sugar once daily as instructed. Dx code: 790.29     pantoprazole 40 MG tablet  Commonly known as:  PROTONIX  Take 1 tablet (40 mg total) by mouth daily. Take 30-60 min before first meal of the day     predniSONE 50 MG tablet  Commonly known as:  DELTASONE  Take 50 mg by mouth daily with breakfast.     UNABLE TO FIND  - Physical Therapy to evaluate and treat.  -   - DX--CHF, deconditioning     ZYRTEC 10 MG tablet  Generic drug:  cetirizine  Take 10 mg by mouth at bedtime.         The results of significant diagnostics from this hospitalization (including imaging, microbiology, ancillary and laboratory) are listed below for reference.    Significant Diagnostic Studies: Dg Chest 2 View  05/27/2014   CLINICAL DATA:  Dizziness, low blood pressure  EXAM: CHEST  2 VIEW   COMPARISON:  04/12/2014, CT chest 02/17/2014  FINDINGS: There is a loculated left lower lung pleural effusion. There is no focal consolidation, pneumothorax or right pleural effusion. Stable cardiomediastinal silhouette. Unremarkable osseous structures.  IMPRESSION: No acute cardiopulmonary disease. Loculated left lower pleural effusion.   Electronically Signed   By: Kathreen Devoid   On: 05/27/2014 12:17   Nm Pulmonary Perf And Vent  05/27/2014   CLINICAL DATA:  Shortness of breath  EXAM: NUCLEAR MEDICINE VENTILATION - PERFUSION LUNG  SCAN  TECHNIQUE: Ventilation images were obtained in multiple projections using inhaled aerosol technetium 99 M DTPA. Perfusion images were obtained in multiple projections after intravenous injection of Tc-21mMAA.  RADIOPHARMACEUTICALS:  40 mCi Tc-942mTPA aerosol and 6 mCi Tc-9967mA  COMPARISON:  None.  FINDINGS: Ventilation: Slight decreased ventilation in the lingula. Central deposition of radiotracer as can be seen with lower airways disease. No other ventilatory defect. Relative hypoventilation of the left lung compared to the right.  Perfusion: No wedge shaped peripheral perfusion defects to suggest acute pulmonary embolism. Matching perfusion defect in the lingula with corresponding chest x-ray abnormality.  IMPRESSION: 1. Low probability for pulmonary embolus.   Electronically Signed   By: HetKathreen DevoidOn: 05/27/2014 16:47     Microbiology: No results found for this or any previous visit (from the past 240 hour(s)).   Labs: Basic Metabolic Panel:  Recent Labs Lab 05/27/14 1153 05/28/14 0105 05/29/14 0330 05/30/14 0614  NA 139 142 142 144  K 4.1 3.3* 3.2* 3.4*  CL 95* 98 97 99  CO2 28 29 31  32  GLUCOSE 209* 93 120* 118*  BUN 65* 59* 57* 54*  CREATININE 1.84* 1.57* 1.58* 1.54*  CALCIUM 8.4 8.2* 8.2* 8.4   Liver Function Tests: No results found for this basename: AST, ALT, ALKPHOS, BILITOT, PROT, ALBUMIN,  in the last 168 hours No results found for  this basename: LIPASE, AMYLASE,  in the last 168 hours No results found for this basename: AMMONIA,  in the last 168 hours CBC:  Recent Labs Lab 05/27/14 1153  WBC 12.7*  NEUTROABS 11.5*  HGB 12.6  HCT 38.1  MCV 88.0  PLT 163   Cardiac Enzymes:  Recent Labs Lab 05/27/14 1153 05/27/14 1917 05/28/14 0105  TROPONINI 0.53* 0.47* 0.31*   BNP: No components found with this basename: POCBNP,  CBG:  Recent Labs Lab 05/29/14 1113 05/29/14 1636 05/29/14 2140 05/30/14 0717 05/30/14 1158  GLUCAP 183* 280* 202* 109* 279*    Time coordinating discharge:  Greater than 30 minutes  Signed:  Rashan Rounsaville, DO [facility] Pager: [phone], 12:51 PM

## 2014-05-30 NOTE — Progress Notes (Signed)
Patient evaluated for community based chronic disease management services with [facility] as a benefit of patient's [employer]. Spoke with patient at bedside to explain [place] [facility] services.  Services have been accepted with written consent.  Patient has elected to go to outpatient physical therapy and will follow up with Dr Renato Shin for diabetes management.  She has requested additional diabetes education regarding carbohydrate management.  Patient will receive a post discharge transition of care call and will be evaluated for monthly home visits for assessments and disease process education.  Left contact information and [facility] literature at bedside. Made Inpatient Case Manager aware that [place] [facility] following. Of note, [facility] Care Management services does not replace or interfere with any services that are arranged by inpatient case management or social work.  For additional questions or referrals please contact Corliss Blacker BSN RN [facility] Liaison at [phone].

## 2014-05-30 NOTE — Progress Notes (Signed)
All d/c instructions explained and given to pt.  Verbalized understanding, at 1448.  Pt d/c off floor to car by w/c.  Karie Kirks, Therapist, sports.

## 2014-05-30 NOTE — Care Management Note (Addendum)
  Page 1 of 1   05/30/2014     2:43:01 PM CARE MANAGEMENT NOTE 05/30/2014  Patient:  Susan Pittman, Susan Pittman   Account Number:  [id number]  Date Initiated:  05/30/2014  Documentation initiated by:  Lavonta Tillis  Subjective/Objective Assessment:   CHF     Action/Plan:   CM to follow for dispositon needs   Anticipated DC Date:  05/30/2014   Anticipated DC Plan:  HOME/SELF CARE         Choice offered to / List presented to:             Status of service:  Completed, signed off Medicare Important Message given?  YES (If response is "NO", the following Medicare IM given date fields will be blank) Date Medicare IM given:  05/30/2014 Medicare IM given by:  Selassie Spatafore Date Additional Medicare IM given:   Additional Medicare IM given by:    Discharge Disposition:    Per UR Regulation:  Reviewed for med. necessity/level of care/duration of stay  If discussed at [place] of Stay Meetings, dates discussed:    Comments:  Zacharee Gaddie RN, BSN, MSHL, CCM  Nurse - Case Manager,  (Unit [place])  [phone]  05/30/2014 Social:  From home with husband. [facility]:  Services initiated. Disposition:  Home self care.

## 2014-05-31 ENCOUNTER — Telehealth: Payer: Self-pay

## 2014-05-31 NOTE — Telephone Encounter (Signed)
Admitted to [facility]:  05/27/14 Discharge to Home:  05/30/14  Pt arrive at hospital via EMS.  Symptoms-low BP, weakness, dizziness, and shortness of breath with exertion.   Patient states over the past few weeks she was having frequent episodes of weakness, dizziness and lightheadedness occur approx. 30 minutes after taking her medications, and it had worsen a few days prior to going to the hospital.  She was seen in the ED and later admitted.    Today, she still feels weak and becomes dizzy upon standing.  BP:  0940- 104/72, 1019- 96/66.  BS: last night- 191, early this am- 114, 0900- 126, 1019- 189.  Denies dry mouth, feeling thirsty.  Voiding. Urine described as a little darker than normal.  Independent of ADLs, however tires easily.  Holds onto walls in home when ambulating.  Occasional use of cane.    Pt was advised to drink fluids when thirsty and to stay hydrated being aware of the amount of fluids consumed. Consume low NA heart healthy diet, include protein.  She was encouraged to call if her symptoms worsen or fail to improve.  She was also advised that if her symptoms become very concerning to call EMS and go back to the hospital. Pt stated understanding and agreed.    Medication and allergies:  Reviewed and updated Valsartan was D/c'd Pt is on Lasix. No potassium.  Last K+ level--05/28/14--3.3 mEq/L  Local pharmacy: [facility], [place], family history and past surgical hx: reviewed and updated  Patient education provided:   Diet- Low Sodium Heart Healthy, Daily weight, and rise slowly upon standing and increase activity slowly.     Orders/prescription written for Physical Therapy--has not received therapy as of yet F/u appt with Richardson Dopp on 06/16/14 @ 2:20 pm.   F/u appt with Jamal Maes scheduled for 06/13/14 per patient F/u appt with Dr. [name] on 06/09/14 at 11:30 am.

## 2014-06-01 ENCOUNTER — Ambulatory Visit: Payer: Medicare Other | Attending: Internal Medicine | Admitting: Physical Therapy

## 2014-06-01 DIAGNOSIS — IMO0001 Reserved for inherently not codable concepts without codable children: Secondary | ICD-10-CM | POA: Insufficient documentation

## 2014-06-01 DIAGNOSIS — C3490 Malignant neoplasm of unspecified part of unspecified bronchus or lung: Secondary | ICD-10-CM | POA: Insufficient documentation

## 2014-06-01 DIAGNOSIS — R262 Difficulty in walking, not elsewhere classified: Secondary | ICD-10-CM | POA: Insufficient documentation

## 2014-06-01 DIAGNOSIS — I509 Heart failure, unspecified: Secondary | ICD-10-CM | POA: Diagnosis not present

## 2014-06-01 DIAGNOSIS — M6281 Muscle weakness (generalized): Secondary | ICD-10-CM | POA: Diagnosis not present

## 2014-06-01 DIAGNOSIS — Z902 Acquired absence of lung [part of]: Secondary | ICD-10-CM | POA: Insufficient documentation

## 2014-06-01 DIAGNOSIS — I10 Essential (primary) hypertension: Secondary | ICD-10-CM | POA: Diagnosis not present

## 2014-06-01 DIAGNOSIS — M25559 Pain in unspecified hip: Secondary | ICD-10-CM | POA: Diagnosis not present

## 2014-06-07 ENCOUNTER — Ambulatory Visit: Payer: Medicare Other | Attending: Internal Medicine | Admitting: Physical Therapy

## 2014-06-07 DIAGNOSIS — Z85118 Personal history of other malignant neoplasm of bronchus and lung: Secondary | ICD-10-CM | POA: Diagnosis not present

## 2014-06-07 DIAGNOSIS — R262 Difficulty in walking, not elsewhere classified: Secondary | ICD-10-CM | POA: Insufficient documentation

## 2014-06-07 DIAGNOSIS — I10 Essential (primary) hypertension: Secondary | ICD-10-CM | POA: Diagnosis not present

## 2014-06-07 DIAGNOSIS — M6281 Muscle weakness (generalized): Secondary | ICD-10-CM | POA: Diagnosis not present

## 2014-06-07 DIAGNOSIS — I509 Heart failure, unspecified: Secondary | ICD-10-CM | POA: Insufficient documentation

## 2014-06-07 DIAGNOSIS — IMO0001 Reserved for inherently not codable concepts without codable children: Secondary | ICD-10-CM | POA: Diagnosis not present

## 2014-06-07 DIAGNOSIS — M25559 Pain in unspecified hip: Secondary | ICD-10-CM | POA: Diagnosis not present

## 2014-06-08 ENCOUNTER — Ambulatory Visit: Payer: Medicare Other | Admitting: Rehabilitation

## 2014-06-08 DIAGNOSIS — IMO0001 Reserved for inherently not codable concepts without codable children: Secondary | ICD-10-CM | POA: Diagnosis not present

## 2014-06-09 ENCOUNTER — Ambulatory Visit: Payer: Medicare Other | Admitting: Rehabilitation

## 2014-06-09 ENCOUNTER — Ambulatory Visit (INDEPENDENT_AMBULATORY_CARE_PROVIDER_SITE_OTHER): Payer: Medicare Other | Admitting: Family Medicine

## 2014-06-09 ENCOUNTER — Encounter: Payer: Self-pay | Admitting: Family Medicine

## 2014-06-09 VITALS — BP 122/76 | HR 62 | Temp 98.0°F | Resp 16 | Wt 223.1 lb

## 2014-06-09 DIAGNOSIS — N183 Chronic kidney disease, stage 3 unspecified: Secondary | ICD-10-CM

## 2014-06-09 DIAGNOSIS — R0602 Shortness of breath: Secondary | ICD-10-CM

## 2014-06-09 DIAGNOSIS — E139 Other specified diabetes mellitus without complications: Secondary | ICD-10-CM

## 2014-06-09 DIAGNOSIS — R0609 Other forms of dyspnea: Secondary | ICD-10-CM

## 2014-06-09 DIAGNOSIS — T380X5A Adverse effect of glucocorticoids and synthetic analogues, initial encounter: Secondary | ICD-10-CM

## 2014-06-09 DIAGNOSIS — IMO0001 Reserved for inherently not codable concepts without codable children: Secondary | ICD-10-CM | POA: Diagnosis not present

## 2014-06-09 DIAGNOSIS — M1990 Unspecified osteoarthritis, unspecified site: Secondary | ICD-10-CM | POA: Diagnosis not present

## 2014-06-09 DIAGNOSIS — R0989 Other specified symptoms and signs involving the circulatory and respiratory systems: Secondary | ICD-10-CM

## 2014-06-09 DIAGNOSIS — E099 Drug or chemical induced diabetes mellitus without complications: Secondary | ICD-10-CM

## 2014-06-09 LAB — BASIC METABOLIC PANEL
BUN: 74 mg/dL — AB (ref 6–23)
CO2: 29 mEq/L (ref 19–32)
Calcium: 9.1 mg/dL (ref 8.4–10.5)
Chloride: 97 mEq/L (ref 96–112)
Creatinine, Ser: 2.1 mg/dL — ABNORMAL HIGH (ref 0.4–1.2)
GFR: 24.77 mL/min — ABNORMAL LOW (ref >60.00)
GLUCOSE: 130 mg/dL — AB (ref 70–99)
POTASSIUM: 3.4 meq/L — AB (ref 3.5–5.1)
Sodium: 140 mEq/L (ref 135–145)

## 2014-06-09 MED ORDER — POLYETHYLENE GLYCOL 3350 17 GM/SCOOP PO POWD: 17.0000 g | Freq: Every day | ORAL | Status: DC

## 2014-06-09 NOTE — Patient Instructions (Signed)
Follow up in 6 weeks to recheck energy, stamina, sugars We'll notify you of your lab results and make any changes if needed Start the Miralax daily Continue to monitor your sugars and your daily weights We'll get the paperwork for the walker all sent in Call with any questions or concerns Hang in there!

## 2014-06-09 NOTE — Progress Notes (Signed)
   Subjective:    Patient ID: Susan Pittman, female    DOB: 1941-02-13, 73 y.o.   MRN: [id number]  [facility] f/u- pt was admitted to hospital 7/24 due to increased SOB, weakness, hypotension.  Pt was volume overloaded.  Diuresed w/ IV lasix.  Weight was 224 at time of DC (weight was 235 at OV on 7/20).  Has been tracking daily weights and today is 219.  Currently on Lasix 80mg  BID- due for BMP.  Has appt w/ Dr Lorrene Reid Monday.  Pt is doing PT regularly- they are now recommending rolling walker w/ seat for better stability.  Pt reports excessive fatigue due to deconditioning, CHF.  [facility] recommended pt start Miralax daily for constipation.  A1C is now up to 7.4.  Is not currently on meds.  Had SSI while hospitalized.  Home CBGs are all <150 w/ exception of evening sugars.   Review of Systems For ROS see HPI     Objective:   Physical Exam  Vitals reviewed. Constitutional: She is oriented to person, place, and time. She appears well-developed and well-nourished. No distress.  Cushingoid appearance  HENT:  Head: Normocephalic and atraumatic.  Cardiovascular: Normal rate, regular rhythm and intact distal pulses.   Pulmonary/Chest: Effort normal and breath sounds normal. No respiratory distress. She has no wheezes. She has no rales.  Musculoskeletal: She exhibits edema. Tenderness: trace to 1+ edema bilaterally.  Neurological: She is alert and oriented to person, place, and time.  Proximal weakness- difficulty rising from seated position  Skin: Skin is warm and dry.  Psychiatric:  depressed          Assessment & Plan:

## 2014-06-09 NOTE — Progress Notes (Signed)
Pre visit review using our clinic review tool, if applicable. No additional management support is needed unless otherwise documented below in the visit note. 

## 2014-06-10 ENCOUNTER — Telehealth: Payer: Self-pay | Admitting: Family Medicine

## 2014-06-10 DIAGNOSIS — I5033 Acute on chronic diastolic (congestive) heart failure: Secondary | ICD-10-CM

## 2014-06-10 DIAGNOSIS — R609 Edema, unspecified: Secondary | ICD-10-CM

## 2014-06-10 NOTE — Telephone Encounter (Signed)
Caller name: Lattie Haw  Call back [phone]   Reason for call:  LLisa, RN from [facility], is needing an order faxed for pt to have compression stockings.  [phone] Profeccient (?)

## 2014-06-10 NOTE — Telephone Encounter (Signed)
dme placed, faxed.

## 2014-06-10 NOTE — Telephone Encounter (Signed)
Please send DME script for knee high compression socks 20-48mmHg to [facility]

## 2014-06-12 NOTE — Assessment & Plan Note (Signed)
Likely multifactorial- steroid use, deconditioning, CHF.  Pt in PT trying to regain strength and stamina.  Script given for rolling walker w/ seat for pt to use as needed to catch her breath.  Will follow.

## 2014-06-12 NOTE — Assessment & Plan Note (Signed)
Pt has known hx of arthritis and now w/ multiple medical issues, having difficulty ambulating.  Will write script for rolling walker w/ seat to provide increased support and stability.

## 2014-06-12 NOTE — Assessment & Plan Note (Signed)
Chronic problem.  Following w/ Dr Lorrene Reid.  Pt is now on 80mg  Lasix BID- needs BMP to assess Cr.  Has appt on Monday.  Goal is to wean steroids.  Will continue to follow along.

## 2014-06-12 NOTE — Assessment & Plan Note (Signed)
Ongoing issue.  Not currently on meds.  Pt's CBGs are reasonably well controlled w/ exception of dinner readings.  Will continue to monitor and start meds prn- although this will be difficult w/ pt's ongoing renal issues.

## 2014-06-13 ENCOUNTER — Emergency Department (HOSPITAL_COMMUNITY): Payer: Medicare Other

## 2014-06-13 ENCOUNTER — Ambulatory Visit: Payer: Medicare Other | Admitting: Rehabilitation

## 2014-06-13 ENCOUNTER — Encounter (HOSPITAL_COMMUNITY): Payer: Self-pay | Admitting: Emergency Medicine

## 2014-06-13 ENCOUNTER — Inpatient Hospital Stay (HOSPITAL_COMMUNITY)
Admission: EM | Admit: 2014-06-13 | Discharge: 2014-06-15 | DRG: 641 | Disposition: A | Discharge status: Home / Self Care | Payer: Medicare Other | Attending: Internal Medicine | Admitting: Internal Medicine

## 2014-06-13 DIAGNOSIS — E099 Drug or chemical induced diabetes mellitus without complications: Secondary | ICD-10-CM

## 2014-06-13 DIAGNOSIS — I701 Atherosclerosis of renal artery: Secondary | ICD-10-CM

## 2014-06-13 DIAGNOSIS — D72829 Elevated white blood cell count, unspecified: Secondary | ICD-10-CM | POA: Diagnosis present

## 2014-06-13 DIAGNOSIS — R5383 Other fatigue: Secondary | ICD-10-CM | POA: Diagnosis not present

## 2014-06-13 DIAGNOSIS — E139 Other specified diabetes mellitus without complications: Secondary | ICD-10-CM | POA: Diagnosis present

## 2014-06-13 DIAGNOSIS — J90 Pleural effusion, not elsewhere classified: Secondary | ICD-10-CM | POA: Diagnosis not present

## 2014-06-13 DIAGNOSIS — I509 Heart failure, unspecified: Secondary | ICD-10-CM | POA: Diagnosis present

## 2014-06-13 DIAGNOSIS — M1990 Unspecified osteoarthritis, unspecified site: Secondary | ICD-10-CM

## 2014-06-13 DIAGNOSIS — N059 Unspecified nephritic syndrome with unspecified morphologic changes: Secondary | ICD-10-CM | POA: Diagnosis not present

## 2014-06-13 DIAGNOSIS — Z79899 Other long term (current) drug therapy: Secondary | ICD-10-CM

## 2014-06-13 DIAGNOSIS — E785 Hyperlipidemia, unspecified: Secondary | ICD-10-CM

## 2014-06-13 DIAGNOSIS — K259 Gastric ulcer, unspecified as acute or chronic, without hemorrhage or perforation: Secondary | ICD-10-CM

## 2014-06-13 DIAGNOSIS — E669 Obesity, unspecified: Secondary | ICD-10-CM

## 2014-06-13 DIAGNOSIS — N183 Chronic kidney disease, stage 3 unspecified: Secondary | ICD-10-CM

## 2014-06-13 DIAGNOSIS — R911 Solitary pulmonary nodule: Secondary | ICD-10-CM | POA: Diagnosis not present

## 2014-06-13 DIAGNOSIS — N390 Urinary tract infection, site not specified: Secondary | ICD-10-CM | POA: Diagnosis present

## 2014-06-13 DIAGNOSIS — I129 Hypertensive chronic kidney disease with stage 1 through stage 4 chronic kidney disease, or unspecified chronic kidney disease: Secondary | ICD-10-CM | POA: Diagnosis present

## 2014-06-13 DIAGNOSIS — IMO0002 Reserved for concepts with insufficient information to code with codable children: Secondary | ICD-10-CM | POA: Diagnosis not present

## 2014-06-13 DIAGNOSIS — I5032 Chronic diastolic (congestive) heart failure: Secondary | ICD-10-CM

## 2014-06-13 DIAGNOSIS — Z85828 Personal history of other malignant neoplasm of skin: Secondary | ICD-10-CM

## 2014-06-13 DIAGNOSIS — L989 Disorder of the skin and subcutaneous tissue, unspecified: Secondary | ICD-10-CM

## 2014-06-13 DIAGNOSIS — R404 Transient alteration of awareness: Secondary | ICD-10-CM | POA: Diagnosis not present

## 2014-06-13 DIAGNOSIS — E860 Dehydration: Principal | ICD-10-CM | POA: Diagnosis present

## 2014-06-13 DIAGNOSIS — R0602 Shortness of breath: Secondary | ICD-10-CM

## 2014-06-13 DIAGNOSIS — E039 Hypothyroidism, unspecified: Secondary | ICD-10-CM | POA: Diagnosis present

## 2014-06-13 DIAGNOSIS — R739 Hyperglycemia, unspecified: Secondary | ICD-10-CM

## 2014-06-13 DIAGNOSIS — J45909 Unspecified asthma, uncomplicated: Secondary | ICD-10-CM

## 2014-06-13 DIAGNOSIS — I2510 Atherosclerotic heart disease of native coronary artery without angina pectoris: Secondary | ICD-10-CM

## 2014-06-13 DIAGNOSIS — I5189 Other ill-defined heart diseases: Secondary | ICD-10-CM

## 2014-06-13 DIAGNOSIS — R0609 Other forms of dyspnea: Secondary | ICD-10-CM

## 2014-06-13 DIAGNOSIS — I5031 Acute diastolic (congestive) heart failure: Secondary | ICD-10-CM

## 2014-06-13 DIAGNOSIS — I959 Hypotension, unspecified: Secondary | ICD-10-CM | POA: Diagnosis present

## 2014-06-13 DIAGNOSIS — N184 Chronic kidney disease, stage 4 (severe): Secondary | ICD-10-CM

## 2014-06-13 DIAGNOSIS — K449 Diaphragmatic hernia without obstruction or gangrene: Secondary | ICD-10-CM

## 2014-06-13 DIAGNOSIS — I951 Orthostatic hypotension: Secondary | ICD-10-CM | POA: Diagnosis not present

## 2014-06-13 DIAGNOSIS — N289 Disorder of kidney and ureter, unspecified: Secondary | ICD-10-CM

## 2014-06-13 DIAGNOSIS — R0600 Dyspnea, unspecified: Secondary | ICD-10-CM

## 2014-06-13 DIAGNOSIS — I491 Atrial premature depolarization: Secondary | ICD-10-CM

## 2014-06-13 DIAGNOSIS — T380X5A Adverse effect of glucocorticoids and synthetic analogues, initial encounter: Secondary | ICD-10-CM | POA: Diagnosis present

## 2014-06-13 DIAGNOSIS — C4490 Unspecified malignant neoplasm of skin, unspecified: Secondary | ICD-10-CM

## 2014-06-13 DIAGNOSIS — R42 Dizziness and giddiness: Secondary | ICD-10-CM

## 2014-06-13 DIAGNOSIS — K219 Gastro-esophageal reflux disease without esophagitis: Secondary | ICD-10-CM | POA: Diagnosis present

## 2014-06-13 DIAGNOSIS — I10 Essential (primary) hypertension: Secondary | ICD-10-CM | POA: Diagnosis present

## 2014-06-13 DIAGNOSIS — Z85118 Personal history of other malignant neoplasm of bronchus and lung: Secondary | ICD-10-CM

## 2014-06-13 DIAGNOSIS — K209 Esophagitis, unspecified without bleeding: Secondary | ICD-10-CM

## 2014-06-13 DIAGNOSIS — R079 Chest pain, unspecified: Secondary | ICD-10-CM

## 2014-06-13 DIAGNOSIS — M810 Age-related osteoporosis without current pathological fracture: Secondary | ICD-10-CM | POA: Diagnosis present

## 2014-06-13 DIAGNOSIS — Z0000 Encounter for general adult medical examination without abnormal findings: Secondary | ICD-10-CM

## 2014-06-13 DIAGNOSIS — R319 Hematuria, unspecified: Secondary | ICD-10-CM

## 2014-06-13 DIAGNOSIS — Z860100 Personal history of colon polyps, unspecified: Secondary | ICD-10-CM

## 2014-06-13 DIAGNOSIS — M949 Disorder of cartilage, unspecified: Secondary | ICD-10-CM

## 2014-06-13 DIAGNOSIS — J189 Pneumonia, unspecified organism: Secondary | ICD-10-CM | POA: Diagnosis not present

## 2014-06-13 DIAGNOSIS — R600 Localized edema: Secondary | ICD-10-CM

## 2014-06-13 DIAGNOSIS — M899 Disorder of bone, unspecified: Secondary | ICD-10-CM

## 2014-06-13 DIAGNOSIS — R5381 Other malaise: Secondary | ICD-10-CM | POA: Diagnosis present

## 2014-06-13 DIAGNOSIS — Z8249 Family history of ischemic heart disease and other diseases of the circulatory system: Secondary | ICD-10-CM

## 2014-06-13 DIAGNOSIS — R7309 Other abnormal glucose: Secondary | ICD-10-CM | POA: Diagnosis not present

## 2014-06-13 DIAGNOSIS — K2100 Gastro-esophageal reflux disease with esophagitis, without bleeding: Secondary | ICD-10-CM

## 2014-06-13 DIAGNOSIS — I5033 Acute on chronic diastolic (congestive) heart failure: Secondary | ICD-10-CM

## 2014-06-13 DIAGNOSIS — N028 Recurrent and persistent hematuria with other morphologic changes: Secondary | ICD-10-CM

## 2014-06-13 DIAGNOSIS — Z902 Acquired absence of lung [part of]: Secondary | ICD-10-CM

## 2014-06-13 DIAGNOSIS — R609 Edema, unspecified: Secondary | ICD-10-CM

## 2014-06-13 DIAGNOSIS — I252 Old myocardial infarction: Secondary | ICD-10-CM | POA: Diagnosis not present

## 2014-06-13 DIAGNOSIS — N02B9 Other recurrent and persistent immunoglobulin A nephropathy: Secondary | ICD-10-CM

## 2014-06-13 LAB — URINALYSIS, ROUTINE W REFLEX MICROSCOPIC
BILIRUBIN URINE: NEGATIVE
Glucose, UA: NEGATIVE mg/dL
Ketones, ur: NEGATIVE mg/dL
Nitrite: NEGATIVE
PROTEIN: 30 mg/dL — AB
SPECIFIC GRAVITY, URINE: 1.008 (ref 1.005–1.030)
UROBILINOGEN UA: 0.2 mg/dL (ref 0.0–1.0)
pH: 6 (ref 5.0–8.0)

## 2014-06-13 LAB — CBC WITH DIFFERENTIAL/PLATELET
BASOS ABS: 0 10*3/uL (ref 0.0–0.1)
Basophils Relative: 0 % (ref 0–1)
EOS ABS: 0 10*3/uL (ref 0.0–0.7)
Eosinophils Relative: 0 % (ref 0–5)
HCT: 35.1 % — ABNORMAL LOW (ref 36.0–46.0)
Hemoglobin: 11.7 g/dL — ABNORMAL LOW (ref 12.0–15.0)
LYMPHS ABS: 1.3 10*3/uL (ref 0.7–4.0)
Lymphocytes Relative: 9 % — ABNORMAL LOW (ref 12–46)
MCH: 29.8 pg (ref 26.0–34.0)
MCHC: 33.3 g/dL (ref 30.0–36.0)
MCV: 89.3 fL (ref 78.0–100.0)
MONO ABS: 0.4 10*3/uL (ref 0.1–1.0)
Monocytes Relative: 3 % (ref 3–12)
NEUTROS PCT: 88 % — AB (ref 43–77)
Neutro Abs: 12.3 10*3/uL — ABNORMAL HIGH (ref 1.7–7.7)
PLATELETS: 203 10*3/uL (ref 150–400)
RBC: 3.93 MIL/uL (ref 3.87–5.11)
RDW: 18.9 % — AB (ref 11.5–15.5)
WBC: 14 10*3/uL — ABNORMAL HIGH (ref 4.0–10.5)

## 2014-06-13 LAB — CBC
HEMATOCRIT: 33.8 % — AB (ref 36.0–46.0)
Hemoglobin: 11.2 g/dL — ABNORMAL LOW (ref 12.0–15.0)
MCH: 29.8 pg (ref 26.0–34.0)
MCHC: 33.1 g/dL (ref 30.0–36.0)
MCV: 89.9 fL (ref 78.0–100.0)
Platelets: 183 10*3/uL (ref 150–400)
RBC: 3.76 MIL/uL — AB (ref 3.87–5.11)
RDW: 19.4 % — ABNORMAL HIGH (ref 11.5–15.5)
WBC: 10.9 10*3/uL — AB (ref 4.0–10.5)

## 2014-06-13 LAB — TROPONIN I: Troponin I: <0.3 ng/mL (ref <0.30)

## 2014-06-13 LAB — BASIC METABOLIC PANEL
Anion gap: 16 — ABNORMAL HIGH (ref 5–15)
BUN: 69 mg/dL — AB (ref 6–23)
CALCIUM: 9.2 mg/dL (ref 8.4–10.5)
CO2: 29 mEq/L (ref 19–32)
CREATININE: 1.77 mg/dL — AB (ref 0.50–1.10)
Chloride: 97 mEq/L (ref 96–112)
GFR, EST AFRICAN AMERICAN: 31 mL/min — AB (ref >90)
GFR, EST NON AFRICAN AMERICAN: 27 mL/min — AB (ref >90)
GLUCOSE: 115 mg/dL — AB (ref 70–99)
Potassium: 3.9 mEq/L (ref 3.7–5.3)
Sodium: 142 mEq/L (ref 137–147)

## 2014-06-13 LAB — URINE MICROSCOPIC-ADD ON

## 2014-06-13 LAB — CREATININE, SERUM
Creatinine, Ser: 1.81 mg/dL — ABNORMAL HIGH (ref 0.50–1.10)
GFR calc non Af Amer: 26 mL/min — ABNORMAL LOW (ref >90)
GFR, EST AFRICAN AMERICAN: 30 mL/min — AB (ref >90)

## 2014-06-13 LAB — TSH: TSH: 0.209 u[IU]/mL — AB (ref 0.350–4.500)

## 2014-06-13 LAB — LACTIC ACID, PLASMA: Lactic Acid, Venous: 2.3 mmol/L — ABNORMAL HIGH (ref 0.5–2.2)

## 2014-06-13 LAB — PRO B NATRIURETIC PEPTIDE: Pro B Natriuretic peptide (BNP): 7258 pg/mL — ABNORMAL HIGH (ref 0–450)

## 2014-06-13 LAB — GLUCOSE, CAPILLARY
GLUCOSE-CAPILLARY: 235 mg/dL — AB (ref 70–99)
GLUCOSE-CAPILLARY: 164 mg/dL — AB (ref 70–99)

## 2014-06-13 MED ORDER — PREDNISONE 50 MG PO TABS
50.0000 mg | ORAL_TABLET | Freq: Every day | ORAL | Status: DC
  Administered 2014-06-14 – 2014-06-15 (×2): 50 mg via ORAL
  Filled 2014-06-13 (×3): qty 1

## 2014-06-13 MED ORDER — FAMOTIDINE 20 MG PO TABS
20.0000 mg | ORAL_TABLET | Freq: Two times a day (BID) | ORAL | Status: DC
  Administered 2014-06-13 – 2014-06-15 (×4): 20 mg via ORAL
  Filled 2014-06-13 (×6): qty 1

## 2014-06-13 MED ORDER — VANCOMYCIN HCL 10 G IV SOLR
1500.0000 mg | INTRAVENOUS | Status: DC
  Filled 2014-06-13: qty 1500

## 2014-06-13 MED ORDER — PANTOPRAZOLE SODIUM 40 MG PO TBEC
40.0000 mg | DELAYED_RELEASE_TABLET | Freq: Every day | ORAL | Status: DC
  Administered 2014-06-14 – 2014-06-15 (×2): 40 mg via ORAL
  Filled 2014-06-13 (×2): qty 1

## 2014-06-13 MED ORDER — DEXTROSE 5 % IV SOLN
1.0000 g | INTRAVENOUS | Status: DC
  Administered 2014-06-14: 1 g via INTRAVENOUS
  Filled 2014-06-13: qty 10

## 2014-06-13 MED ORDER — ALPRAZOLAM 0.5 MG PO TABS
0.5000 mg | ORAL_TABLET | Freq: Every day | ORAL | Status: DC
  Administered 2014-06-13 – 2014-06-14 (×2): 0.5 mg via ORAL
  Filled 2014-06-13 (×2): qty 1

## 2014-06-13 MED ORDER — HEPARIN SODIUM (PORCINE) 5000 UNIT/ML IJ SOLN
5000.0000 [IU] | Freq: Three times a day (TID) | INTRAMUSCULAR | Status: DC
  Administered 2014-06-13 – 2014-06-15 (×5): 5000 [IU] via SUBCUTANEOUS
  Filled 2014-06-13 (×8): qty 1

## 2014-06-13 MED ORDER — ONDANSETRON HCL 4 MG PO TABS: 4.0000 mg | ORAL_TABLET | Freq: Four times a day (QID) | ORAL | Status: DC | PRN

## 2014-06-13 MED ORDER — NEBIVOLOL HCL 10 MG PO TABS
20.0000 mg | ORAL_TABLET | Freq: Every day | ORAL | Status: DC
  Administered 2014-06-13: 20 mg via ORAL
  Filled 2014-06-13 (×2): qty 2

## 2014-06-13 MED ORDER — ALBUTEROL SULFATE (2.5 MG/3ML) 0.083% IN NEBU: 2.5000 mg | INHALATION_SOLUTION | Freq: Four times a day (QID) | RESPIRATORY_TRACT | Status: DC | PRN

## 2014-06-13 MED ORDER — DEXTROSE 5 % IV SOLN
1.0000 g | Freq: Once | INTRAVENOUS | Status: AC
  Administered 2014-06-13: 1 g via INTRAVENOUS
  Filled 2014-06-13: qty 1

## 2014-06-13 MED ORDER — CALCIUM CARB-CHOLECALCIFEROL 600-800 MG-UNIT PO TABS: 1.0000 | ORAL_TABLET | Freq: Every day | ORAL | Status: DC

## 2014-06-13 MED ORDER — INSULIN ASPART 100 UNIT/ML ~~LOC~~ SOLN
0.0000 [IU] | Freq: Three times a day (TID) | SUBCUTANEOUS | Status: DC
  Administered 2014-06-13: 3 [IU] via SUBCUTANEOUS
  Administered 2014-06-14: 2 [IU] via SUBCUTANEOUS
  Administered 2014-06-14: 3 [IU] via SUBCUTANEOUS
  Administered 2014-06-15: 2 [IU] via SUBCUTANEOUS

## 2014-06-13 MED ORDER — FUROSEMIDE 40 MG PO TABS
40.0000 mg | ORAL_TABLET | Freq: Two times a day (BID) | ORAL | Status: DC
  Administered 2014-06-13 – 2014-06-14 (×2): 40 mg via ORAL
  Filled 2014-06-13 (×4): qty 1

## 2014-06-13 MED ORDER — LEVOTHYROXINE SODIUM 88 MCG PO TABS
88.0000 ug | ORAL_TABLET | Freq: Every day | ORAL | Status: DC
  Administered 2014-06-14 – 2014-06-15 (×2): 88 ug via ORAL
  Filled 2014-06-13 (×3): qty 1

## 2014-06-13 MED ORDER — MOMETASONE FURO-FORMOTEROL FUM 100-5 MCG/ACT IN AERO
2.0000 | INHALATION_SPRAY | Freq: Two times a day (BID) | RESPIRATORY_TRACT | Status: DC
  Filled 2014-06-13: qty 8.8

## 2014-06-13 MED ORDER — CALCIUM CARBONATE-VITAMIN D 500-200 MG-UNIT PO TABS
1.0000 | ORAL_TABLET | Freq: Every day | ORAL | Status: DC
  Administered 2014-06-14 – 2014-06-15 (×2): 1 via ORAL
  Filled 2014-06-13 (×3): qty 1

## 2014-06-13 MED ORDER — ONDANSETRON HCL 4 MG/2ML IJ SOLN: 4.0000 mg | Freq: Four times a day (QID) | INTRAMUSCULAR | Status: DC | PRN

## 2014-06-13 NOTE — ED Notes (Signed)
MD at bedside. Dr. Thurnell Garbe.

## 2014-06-13 NOTE — ED Provider Notes (Signed)
CSN: [id number]     Arrival date & time 06/13/14  7342 History   First MD Initiated Contact with Patient 06/13/14 [phone]     Chief Complaint  Patient presents with  . Weakness      HPI Pt was seen at 0930. Per pt, c/o gradual onset and persistence of constant generalized weakness since approximately 0630 this morning PTA. Pt states she was going about her normal morning routine when she began to feel "very weak." States she took her BP and "it was low." Pt states she was too weak to move from her recliner to her couch. Pt states she was due to see her Renal MD this morning but was "too weak" to do so. Pt has hx of same last month and was admitted to the hospital with several of her medications adjusted. Pt states she was evaluated by her PMD last week, and had blood work performed. Pt states she was called yesterday and was told to cut her lasix dose in half. Denies CP/palpitations, no SOB/cough, no abd pain, no N/V/D, no back pain, no focal motor weakness, no tingling/numbness in extremities, no facial droop, no slurred speech, no visual changes, no recent fevers.    Past Medical History  Diagnosis Date  . Asthma   . Hypertension   . GERD (gastroesophageal reflux disease)   . Allergy   . Hypothyroid   . Osteoporosis   . Gastric ulcer   . Hiatal hernia   . Hyperlipidemia     "don't take RX for it" (11/08/2013)  . Renal artery stenosis     with stent placement  . Lung mass     superior segment LLL/notes 11/08/2013  . Pneumonia     "I've had it ?3 times" (11/08/2013)  . Arthritis     "knees, hips, back, hands" (11/08/2013)  . DJD (degenerative joint disease)   . Lung nodule/ adenoca > LLobectomy 12/15/13  11/08/2013    PET 11/23/2013 1. Dominant sub solid left lower lobe nodule does not demonstrate significantly increased metabolic activity. However, morphologically, adenocarcinoma is still a concern.   - Spirometry 11/24/13 wnl  - 11/24/2013 T surg eval rec> LLower lobectomy 12/15/2013 for adenoca    . CAD (coronary artery disease), non obstructive by cardiac cath 12/12/13 01/24/2014  . Basal cell carcinoma     "cut them off face & right shoulder" (11/08/2013)  . Diastolic CHF   . CKD (chronic kidney disease), stage III   . Nephropathy    Past Surgical History  Procedure Laterality Date  . Knee arthroscopy Bilateral   . Anterior cervical decomp/discectomy fusion  2000    C5-C6  . Renal artery stent Right 11/08/2013    Archie Endo 11/08/2013  . Tonsillectomy and adenoidectomy  ?1946  . Cholecystectomy  1970's  . Vaginal hysterectomy  1970  . Dilation and curettage of uterus  1960's  . Skin cancer excision    . Coronary angiogram      Hx: of 2/ 2015  . Cardiac catheterization      Hx: of 2/ 2015  . Video bronchoscopy N/A 12/15/2013    Procedure: VIDEO BRONCHOSCOPY;  Surgeon: Grace Isaac, MD;  Location: [facility] OR;  Service: Thoracic;  Laterality: N/A;  . Video assisted thoracoscopy (vats)/wedge resection Left 12/15/2013    Procedure: VIDEO ASSISTED THORACOSCOPY (VATS)/WEDGE RESECTION;  Surgeon: Grace Isaac, MD;  Location: [place];  Service: Thoracic;  Laterality: Left;  . Lobectomy Left 12/15/2013    Procedure: LOBECTOMY;  Surgeon: Percell Miller  Maryruth Bun, MD;  Location: [facility] OR;  Service: Thoracic;  Laterality: Left;   Family History  Problem Relation Age of Onset  . Breast cancer Sister   . Emphysema Father     smoked  . Allergies Sister   . Allergies Brother   . Heart disease Mother   . Heart disease Father   . Diabetes Father   . Diabetes Brother    History  Substance Use Topics  . Smoking status: Never Smoker   . Smokeless tobacco: Never Used  . Alcohol Use: No    Review of Systems ROS: Statement: All systems negative except as marked or noted in the HPI; Constitutional: Negative for fever and chills. +lightheadedness, generalized weakness. ; ; Eyes: Negative for eye pain, redness and discharge. ; ; ENMT: Negative for ear pain, hoarseness, nasal congestion, sinus pressure and  sore throat. ; ; Cardiovascular: Negative for chest pain, palpitations, diaphoresis, dyspnea and peripheral edema. ; ; Respiratory: Negative for cough, wheezing and stridor. ; ; Gastrointestinal: Negative for nausea, vomiting, diarrhea, abdominal pain, blood in stool, hematemesis, jaundice and rectal bleeding. . ; ; Genitourinary: Negative for dysuria, flank pain and hematuria. ; ; Musculoskeletal: Negative for back pain and neck pain. Negative for swelling and trauma.; ; Skin: Negative for pruritus, rash, abrasions, blisters, bruising and skin lesion.; ; Neuro: Negative for headache and neck stiffness. Negative for altered level of consciousness , altered mental status, extremity weakness, paresthesias, involuntary movement, seizure and syncope.      Allergies  Benazepril hcl; Loratadine; Celecoxib; Lisinopril; Allegra; and Sulfa antibiotics  Home Medications   Prior to Admission medications   Medication Sig Start Date End Date Taking? Authorizing Provider  albuterol (PROVENTIL HFA;VENTOLIN HFA) 108 (90 BASE) MCG/ACT inhaler Inhale 1-2 puffs into the lungs every 6 (six) hours as needed for wheezing or shortness of breath. 12/31/13   Grace Isaac, MD  ALPRAZolam Duanne Moron) 0.5 MG tablet Take 0.5 mg by mouth at bedtime as needed for sleep.    Historical Provider, MD  Blood Glucose Monitoring Suppl (ONE TOUCH ULTRA 2) W/DEVICE KIT 1 Device by Does not apply route once. 04/01/14   Renato Shin, MD  Calcium Carb-Cholecalciferol 600-800 MG-UNIT TABS Take 1 tablet by mouth daily.    Historical Provider, MD  cetirizine (ZYRTEC) 10 MG tablet Take 10 mg by mouth at bedtime.     Historical Provider, MD  DULERA 100-5 MCG/ACT AERO Inhale 2 puffs into the lungs 2 (two) times daily as needed for shortness of breath.  04/13/14   Historical Provider, MD  famotidine (PEPCID) 20 MG tablet Take 20 mg by mouth 2 (two) times daily.    Historical Provider, MD  fluticasone (FLONASE) 50 MCG/ACT nasal spray Place 2 sprays  into both nostrils daily as needed for allergies or rhinitis.    Historical Provider, MD  furosemide (LASIX) 80 MG tablet Take 1 tablet (80 mg total) by mouth 2 (two) times daily. 05/30/14   Orson Eva, MD  glucose blood (ONE TOUCH ULTRA TEST) test strip Pt to test 3-4x daily due to labile sugars Dx. 407.68 04/20/14   [name], MD  hydrALAZINE (APRESOLINE) 50 MG tablet Take 50 mg by mouth daily.    Historical Provider, MD  Lancets [facility] ULTRASOFT) lancets Use 1 per day dx code 249.00 04/19/14   Elayne Snare, MD  levothyroxine (SYNTHROID, LEVOTHROID) 88 MCG tablet Take 88 mcg by mouth daily before breakfast.    Historical Provider, MD  nebivolol (BYSTOLIC) 10 MG tablet  Take 20 mg by mouth daily.    Historical Provider, MD  [facility] DELICA LANCETS 69I MISC Use to check blood sugar once daily as instructed. Dx code: 790.29 04/19/14   Renato Shin, MD  pantoprazole (PROTONIX) 40 MG tablet Take 1 tablet (40 mg total) by mouth daily. Take 30-60 min before first meal of the day 04/22/14   Tanda Rockers, MD  polyethylene glycol powder (GLYCOLAX/MIRALAX) powder Take 17 g by mouth daily. 06/09/14   [name], MD  predniSONE (DELTASONE) 50 MG tablet Take 50 mg by mouth daily with breakfast.     Historical Provider, MD  UNABLE TO FIND Physical Therapy to evaluate and treat.  DX--CHF, deconditioning 05/30/14   Orson Eva, MD   BP 163/66  Pulse 66  Temp(Src) 98.1 F (36.7 C) (Oral)  Resp 20  SpO2 98% Physical Exam 0935: Physical examination:  Nursing notes reviewed; Vital signs and O2 SAT reviewed;  Constitutional: Well developed, Well nourished, Well hydrated, In no acute distress; Head:  Normocephalic, atraumatic; Eyes: EOMI, PERRL, No scleral icterus; ENMT: Mouth and pharynx normal, Mucous membranes moist; Neck: Supple, Full range of motion, No lymphadenopathy; Cardiovascular: Regular rate and rhythm, No gallop; Respiratory: Breath sounds clear & equal bilaterally, No wheezes.  Speaking full  sentences with ease, Normal respiratory effort/excursion; Chest: Nontender, Movement normal; Abdomen: Soft, Nontender, Nondistended, Normal bowel sounds; Genitourinary: No CVA tenderness; Extremities: Pulses normal, No tenderness, +1 pedal edema bilat without calf asymmetry.; Neuro: AA&Ox3, Major CN grossly intact.  Speech clear. No gross focal motor or sensory deficits in extremities.; Skin: Color normal, Warm, Dry.    ED Course  Procedures      EKG Interpretation   Date/Time:  Monday June 13 2014 09:32:43 EDT Ventricular Rate:  63 PR Interval:  124 QRS Duration: 102 QT Interval:  385 QTC Calculation: 394 R Axis:   59 Text Interpretation:  Normal sinus rhythm When compared with ECG of  05/27/2014 T wave abnormality is no longer Present When compared with ECG  of 04/12/2014 No significant change was found Confirmed by  [facility]  MD,  Nunzio Cory [phone]) on 06/13/2014 9:44:53 AM      MDM  MDM Reviewed: previous chart, nursing note and vitals Reviewed previous: ECG and labs Interpretation: ECG, labs and x-ray    Results for orders placed during the hospital encounter of 06/13/14  URINALYSIS, ROUTINE W REFLEX MICROSCOPIC      Result Value Ref Range   Color, Urine YELLOW  YELLOW   APPearance CLEAR  CLEAR   Specific Gravity, Urine 1.008  1.005 - 1.030   pH 6.0  5.0 - 8.0   Glucose, UA NEGATIVE  NEGATIVE mg/dL   Hgb urine dipstick SMALL (*) NEGATIVE   Bilirubin Urine NEGATIVE  NEGATIVE   Ketones, ur NEGATIVE  NEGATIVE mg/dL   Protein, ur 30 (*) NEGATIVE mg/dL   Urobilinogen, UA 0.2  0.0 - 1.0 mg/dL   Nitrite NEGATIVE  NEGATIVE   Leukocytes, UA MODERATE (*) NEGATIVE  BASIC METABOLIC PANEL      Result Value Ref Range   Sodium 142  137 - 147 mEq/L   Potassium 3.9  3.7 - 5.3 mEq/L   Chloride 97  96 - 112 mEq/L   CO2 29  19 - 32 mEq/L   Glucose, Bld 115 (*) 70 - 99 mg/dL   BUN 69 (*) 6 - 23 mg/dL   Creatinine, Ser 1.77 (*) 0.50 - 1.10 mg/dL   Calcium 9.2  8.4 - 10.5 mg/dL  GFR calc non Af Amer 27 (*) >90 mL/min   GFR calc Af Amer 31 (*) >90 mL/min   Anion gap 16 (*) 5 - 15  CBC WITH DIFFERENTIAL      Result Value Ref Range   WBC 14.0 (*) 4.0 - 10.5 K/uL   RBC 3.93  3.87 - 5.11 MIL/uL   Hemoglobin 11.7 (*) 12.0 - 15.0 g/dL   HCT 35.1 (*) 36.0 - 46.0 %   MCV 89.3  78.0 - 100.0 fL   MCH 29.8  26.0 - 34.0 pg   MCHC 33.3  30.0 - 36.0 g/dL   RDW 18.9 (*) 11.5 - 15.5 %   Platelets 203  150 - 400 K/uL   Neutrophils Relative % 88 (*) 43 - 77 %   Lymphocytes Relative 9 (*) 12 - 46 %   Monocytes Relative 3  3 - 12 %   Eosinophils Relative 0  0 - 5 %   Basophils Relative 0  0 - 1 %   Neutro Abs 12.3 (*) 1.7 - 7.7 K/uL   Lymphs Abs 1.3  0.7 - 4.0 K/uL   Monocytes Absolute 0.4  0.1 - 1.0 K/uL   Eosinophils Absolute 0.0  0.0 - 0.7 K/uL   Basophils Absolute 0.0  0.0 - 0.1 K/uL   RBC Morphology STOMATOCYTES     WBC Morphology MILD LEFT SHIFT (1-5% METAS, OCC MYELO, OCC BANDS)    TROPONIN I      Result Value Ref Range   Troponin I <0.30  <0.30 ng/mL  PRO B NATRIURETIC PEPTIDE      Result Value Ref Range   Pro B Natriuretic peptide (BNP) 7258.0 (*) 0 - 450 pg/mL  LACTIC ACID, PLASMA      Result Value Ref Range   Lactic Acid, Venous 2.3 (*) 0.5 - 2.2 mmol/L  URINE MICROSCOPIC-ADD ON      Result Value Ref Range   Squamous Epithelial / LPF FEW (*) RARE   WBC, UA 7-10  <3 WBC/hpf   Bacteria, UA RARE  RARE   Dg Chest 2 View 06/13/2014   CLINICAL DATA:  Hypotension  EXAM: CHEST  2 VIEW  COMPARISON:  May 27, 2014  FINDINGS: Apparent loculated effusion on the left is stable. There is no edema consolidation. No new opacity. Heart is upper normal in size with pulmonary vascularity within normal limits. No adenopathy. There is postoperative change in the lower cervical spine.  IMPRESSION: Loculated effusion left base, stable. No edema or consolidation. No new opacity compared to most recent prior study. Heart prominent but stable.   Electronically Signed   By: Lowella Grip M.D.   On: 06/13/2014 10:08   Ct Chest Wo Contrast 06/13/2014   CLINICAL DATA:  Left pleural effusion.  EXAM: CT CHEST WITHOUT CONTRAST  TECHNIQUE: Multidetector CT imaging of the chest was performed following the standard protocol without IV contrast.  COMPARISON:  Chest CTs dated 02/17/2014 and 11/05/2013  FINDINGS: There is minimal chronic pleural thickening at the left lung base with blunting of the costophrenic angle. The patient has had previous surgery with left lower lobectomy. There is a tiny amount of loculated fluid at the left lung base as well as a tiny left pericardial effusion. The findings have diminished since the prior CT scan.  There is a 2 mm nodule in the posterior medial aspect of the right upper lobe on image 19 of series 3, unchanged since 11/05/2013. There is no hilar or mediastinal adenopathy. Heart  size is normal. Visualized portion of the upper abdomen is normal.  IMPRESSION: No acute abnormality. Slowly resolving pleural thickening and loculated fluid at the left lung base.  Stable 2 mm nodule in the right upper lobe.   Electronically Signed   By: Rozetta Nunnery M.D.   On: 06/13/2014 12:25   Nm Pulmonary Perf And Vent 05/27/2014   CLINICAL DATA:  Shortness of breath  EXAM: NUCLEAR MEDICINE VENTILATION - PERFUSION LUNG SCAN  TECHNIQUE: Ventilation images were obtained in multiple projections using inhaled aerosol technetium 99 M DTPA. Perfusion images were obtained in multiple projections after intravenous injection of Tc-2mMAA.  RADIOPHARMACEUTICALS:  40 mCi Tc-955mTPA aerosol and 6 mCi Tc-9966mA  COMPARISON:  None.  FINDINGS: Ventilation: Slight decreased ventilation in the lingula. Central deposition of radiotracer as can be seen with lower airways disease. No other ventilatory defect. Relative hypoventilation of the left lung compared to the right.  Perfusion: No wedge shaped peripheral perfusion defects to suggest acute pulmonary embolism. Matching perfusion defect in  the lingula with corresponding chest x-ray abnormality.  IMPRESSION: 1. Low probability for pulmonary embolus.   Electronically Signed   By: HetKathreen DevoidOn: 05/27/2014 16:47    Results for PERSAMIYYAH, MOFFARN [id number] of 06/13/2014 13:07  Ref. Range 05/28/2014 01:05 05/29/2014 03:30 05/30/2014 06:14 06/09/2014 12:37 06/13/2014 09:41  BUN Latest Range: 6-23 mg/dL 59 (H) 57 (H) 54 (H) 74 (H) 69 (H)  Creatinine Latest Range: 0.50-1.10 mg/dL 1.57 (H) 1.58 (H) 1.54 (H) 2.1 (H) 1.77 (H)    Results for PERANUREET, BRUINGTONRN [id number] of 06/13/2014 13:07  Ref. Range 01/25/2014 13:14 04/12/2014 15:27 05/27/2014 11:53 05/30/2014 06:14 06/13/2014 09:41  Pro B Natriuretic peptide (BNP) Latest Range: 0-450 pg/mL 3400.00 (H) 2946.0 (H) 10297.0 (H) 10599.0 (H) 7258.0 (H)    1255:  Pt is not orthostatic on VS, but does require a significant amount of assistance moving on and off stretcher due to generalized weakness. BUN/Cr elevated, but improving from last weeks lab values. H/H per baseline. CBC with leukocytosis and left shift; unclear UTI on Udip (UC pending). CT scan with left pleural thickening and effusion which is improved from previous CT scan. Pt states she "was on antibiotics" after her previous CT scan "because they said I had pneumonia." Will start abx. Will obtain BC x2. T/C to [facility] Dr. BurDaleen Boase discussed, including:  HPI, pertinent PM/SHx, VS/PE, dx testing, ED course and treatment:  Agreeable to admit, requests to write temporary orders, obtain medical bed to team 10.     KatFrancine GravenO 06/16/14 1523

## 2014-06-13 NOTE — ED Notes (Signed)
Arrives [facility] from home for general weakness. Admitted July 24th-27th for the same. This morning around 0630 and 0700 pt was trying to move from the recliner to the couch and was to weak to do so. Pt states she felt as if her BP was low.

## 2014-06-13 NOTE — Progress Notes (Addendum)
ANTIBIOTIC CONSULT NOTE - INITIAL  Pharmacy Consult for Vancomycin -> Ceftriaxone Indication: UTI  Allergies  Allergen Reactions  . Benazepril Hcl Anaphylaxis and Cough  . Loratadine Anaphylaxis    anaphylaxis  . Celecoxib     REACTION: aching  . Lisinopril     REACTION: cough  . Allegra [Fexofenadine Hcl]     Severe back pain  . Sulfa Antibiotics Hives    Patient Measurements:   Adjusted Body Weight:  70 kg  Vital Signs: Temp: 98.1 F (36.7 C) (08/10 0933) Temp src: Oral (08/10 0933) BP: 159/58 mmHg (08/10 1205) Pulse Rate: 62 (08/10 1205) Intake/Output from previous day:   Intake/Output from this shift:    Labs:  Recent Labs  06/13/14 0941  WBC 14.0*  HGB 11.7*  PLT 203  CREATININE 1.77*   The CrCl is unknown because both a height and weight (above a minimum accepted value) are required for this calculation. No results found for this basename: VANCOTROUGH, VANCOPEAK, VANCORANDOM, GENTTROUGH, GENTPEAK, GENTRANDOM, TOBRATROUGH, TOBRAPEAK, TOBRARND, AMIKACINPEAK, AMIKACINTROU, AMIKACIN,  in the last 72 hours   Microbiology: No results found for this or any previous visit (from the past 720 hour(s)).  Medical History: Past Medical History  Diagnosis Date  . Asthma   . Hypertension   . GERD (gastroesophageal reflux disease)   . Allergy   . Hypothyroid   . Osteoporosis   . Gastric ulcer   . Hiatal hernia   . Hyperlipidemia     "don't take RX for it" (11/08/2013)  . Renal artery stenosis     with stent placement  . Lung mass     superior segment LLL/notes 11/08/2013  . Pneumonia     "I've had it ?3 times" (11/08/2013)  . Arthritis     "knees, hips, back, hands" (11/08/2013)  . DJD (degenerative joint disease)   . Lung nodule/ adenoca > LLobectomy 12/15/13  11/08/2013    PET 11/23/2013 1. Dominant sub solid left lower lobe nodule does not demonstrate significantly increased metabolic activity. However, morphologically, adenocarcinoma is still a concern.   -  Spirometry 11/24/13 wnl  - 11/24/2013 T surg eval rec> LLower lobectomy 12/15/2013 for adenoca   . CAD (coronary artery disease), non obstructive by cardiac cath 12/12/13 01/24/2014  . Basal cell carcinoma     "cut them off face & right shoulder" (11/08/2013)  . Diastolic CHF   . CKD (chronic kidney disease), stage III   . Nephropathy   . NSTEMI (non-ST elevation myocardial infarction) 05/2014    Medications: See med rec  Assessment: Recurrence of general weakness (recent admit for same 7/15). 73 y/o F admitted with c/o significant weakness. She was recently admitted last month with the same complaints and several of her medications were adjusted. WBC elevated at 14 but pt is on chronic steroids. Afebrile. Scr 1.77 with estimated CrCl 30. proBNP 7,258, Lactic acid 2.3. CXR with no acute abnormality. UA with moderate leukocytes. Plan to start empiric Vancomycin.  PMH: HTN, RAS, GERD, hypothyroid,CAD, IgA nephropathy, EF 65%, 2/15 VATS for LLL mass= invasive adenocarcinoma, asthma, osteoporosis, h/o gastric ulcer, hiatal hermia, HLD, PNA, arthritis, DJD, CAD, basal cell carcinoma, dHF, CKD,   Goal of Therapy:  Vancomycin trough level 15-20 mcg/ml  Plan:  Start Vancomycin 1500mg  IV q24h.  Trough after 3-5 days at steady state.   Crystal S. Alford Highland, PharmD, [facility] Clinical Staff Pharmacist Pager [phone]    Addendum [phone]): Admitting MD changed antibiotics to ceftriaxone only. Pt received Cefepime 1gm IV ~1400  in ED.  Plan: Ceftriaxone 1gm IV q24h. First dose tomorrow at 1200. Dose of Cefepime will cover pt for ~24hrs. Pharmacy will sign off as this medication does not require dosage adjustments - please reconsult if needed.  Sherlon Handing, PharmD, BCPS Clinical pharmacist, pager [phone] 06/13/2014  2:44 PM

## 2014-06-13 NOTE — Evaluation (Signed)
Physical Therapy Evaluation Patient Details Name: Susan Pittman MRN: [id number] DOB: 12-28-1940 Today's Date: 06/13/2014   History of Present Illness  Patient is a 73 y/o female presents with dizziness, lightheadedness, and low blood pressure after taking her morning medications. Patient also reports difficulty walking associated with generalized weakness. S/p fall out of her rollator chair prior to admission. PMH positive for HTN, RAS s/p stent, CAD (non obstructive), h/o Lung CA s/p LL lobectomy , hypothyroidism and IgA nephropathy (on prednsione)   Clinical Impression  Patient presents with functional limitations due to deficits listed in PT problem list (see below). Pt with generalized weakness and decreased endurance noted during ambulation. SOB present and reports of fatigue. Requires assist with bed mobility and transfers from low surfaces. Pt would benefit from skilled PT in acute setting to improve safe mobility/strength and to maximize independence. Recommend continuing OPPT at discharge.    Follow Up Recommendations Outpatient PT;Supervision for mobility/OOB    Equipment Recommendations  None recommended by PT    Recommendations for Other Services       Precautions / Restrictions Precautions Precautions: Fall Restrictions Weight Bearing Restrictions: No      Mobility  Bed Mobility Overal bed mobility: Needs Assistance Bed Mobility: Supine to Sit;Sit to Supine     Supine to sit: Min guard;HOB elevated Sit to supine: Min assist;HOB elevated   General bed mobility comments: Required Min A with LLE to return to supine with HOB flat, no rails utilizing log roll technique. Able to get to EOB without assist with HOB elevated and use of rails.  Transfers Overall transfer level: Needs assistance Equipment used: Rolling walker (2 wheeled) Transfers: Sit to/from Stand Sit to Stand: Min assist;Min guard         General transfer comment: Stood from EOB x2 with use of RW  Min guard assist, stood from toilet with Min A secondary to low height with use of grab bar. More difficulty from lower surfaces.  Ambulation/Gait Ambulation/Gait assistance: Min guard Ambulation Distance (Feet): 150 Feet Assistive device: Rolling walker (2 wheeled) Gait Pattern/deviations: Decreased stride length;Step-through pattern;Trunk flexed;Trendelenburg Gait velocity: 1.2 ft/sec Gait velocity interpretation: <1.8 ft/sec, indicative of risk for recurrent falls General Gait Details: Demonstrated trendelenberg on LLE with increased knee flexion on left during stance phase of gait. Fatigues quickly. SOB present with audible wheezing, vitals stable.  Stairs            Wheelchair Mobility    Modified Rankin (Stroke Patients Only)       Balance Overall balance assessment: Needs assistance   Sitting balance-Leahy Scale: Good       Standing balance-Leahy Scale: Fair Standing balance comment: Able to walk short distances holding onto counter top and wall for support within room however requires support for longer distances with use of RW.                             Pertinent Vitals/Pain Pain Assessment: No/denies pain    Home Living Family/patient expects to be discharged to:: Private residence Living Arrangements: Spouse/significant other Available Help at Discharge: Family;Available 24 hours/day Type of Home: House Home Access: Level entry     Home Layout: One level Home Equipment: Walker - 4 wheels;Cane - quad;Toilet riser;Shower seat      Prior Function Level of Independence: Independent with assistive device(s)         Comments: Uses rollator for mobility. Husband does most of IADLs - cooking,  cleaning. Pt helps PRN. Doing OPPT.     Hand Dominance   Dominant Hand: Right    Extremity/Trunk Assessment   Upper Extremity Assessment: Overall WFL for tasks assessed           Lower Extremity Assessment: RLE deficits/detail;LLE  deficits/detail RLE Deficits / Details: Reports diminished light touch sensation in right foot and sometimes goes proximally to mid shin. LLE Deficits / Details: Reports diminished light touch sensation in right foot and sometimes goes proximally to mid shin.     Communication   Communication: No difficulties  Cognition Arousal/Alertness: Awake/alert Behavior During Therapy: WFL for tasks assessed/performed Overall Cognitive Status: Within Functional Limits for tasks assessed                      General Comments General comments (skin integrity, edema, etc.): Minimal edema noted BLEs and into feet- reported as premorbid.    Exercises        Assessment/Plan    PT Assessment Patient needs continued PT services  PT Diagnosis Abnormality of gait;Generalized weakness   PT Problem List Decreased strength;Cardiopulmonary status limiting activity;Impaired sensation;Decreased activity tolerance;Decreased knowledge of use of DME;Decreased balance;Decreased safety awareness;Decreased mobility;Decreased knowledge of precautions  PT Treatment Interventions DME instruction;Balance training;Gait training;Patient/family education;Functional mobility training;Therapeutic activities;Therapeutic exercise   PT Goals (Current goals can be found in the Care Plan section) Acute Rehab PT Goals Patient Stated Goal: to get stronger. PT Goal Formulation: With patient Time For Goal Achievement: 06/27/14 Potential to Achieve Goals: Good    Frequency Min 3X/week   Barriers to discharge        Co-evaluation               End of Session Equipment Utilized During Treatment: Gait belt Activity Tolerance: Patient tolerated treatment well Patient left: in bed;with call bell/phone within reach;with bed alarm set;with family/visitor present Nurse Communication: Mobility status;Precautions         Time: 1645-1710 PT Time Calculation (min): 25 min   Charges:   PT Evaluation $Initial PT  Evaluation Tier I: 1 Procedure PT Treatments $Gait Training: 8-22 mins   PT G CodesCandy Sledge A 06/13/2014, 5:24 PM Candy Sledge, [place], DPT [phone]

## 2014-06-13 NOTE — ED Notes (Addendum)
MD at bedside. Dr. Thurnell Garbe.

## 2014-06-13 NOTE — H&P (Addendum)
[facility] History and Physical  Susan Pittman NOB:[id number] DOB: [birth year] DOA: 06/13/2014  Referring physician:  PCP: [name], MD  Specialists:   Chief Complaint: weakness, hypotension  HPI: Susan Pittman is a 73 y.o. female PMH of HTN, RAS s/p stent, CAD (non obstructive), h/o Lung CA s/p LL lobectomy , hypothyroidism,  IgA nephropathy (on prednsione) presents with dizziness, lightheadedness, and low blood pressure after taking her morning medications; Patient also reports difficulty walking associated with generalized weakness; no focal neuro weakness, or paraesthesia,; denies chest pain, no SOB, no nausea vomiting, no significant diarrhea, no fever;  -Pt was recently admitted with similar symptoms   Review of Systems: The patient denies anorexia, fever, weight loss,, vision loss, decreased hearing, hoarseness, chest pain, syncope, dyspnea on exertion, peripheral edema, balance deficits, hemoptysis, abdominal pain, melena, hematochezia, severe indigestion/heartburn, hematuria, incontinence, genital sores, muscle weakness, suspicious skin lesions, transient blindness, difficulty walking, depression, unusual weight change, abnormal bleeding, enlarged lymph nodes, angioedema, and breast masses.    Past Medical History  Diagnosis Date  . Asthma   . Hypertension   . GERD (gastroesophageal reflux disease)   . Allergy   . Hypothyroid   . Osteoporosis   . Gastric ulcer   . Hiatal hernia   . Hyperlipidemia     "don't take RX for it" (11/08/2013)  . Renal artery stenosis     with stent placement  . Lung mass     superior segment LLL/notes 11/08/2013  . Pneumonia     "I've had it ?3 times" (11/08/2013)  . Arthritis     "knees, hips, back, hands" (11/08/2013)  . DJD (degenerative joint disease)   . Lung nodule/ adenoca > LLobectomy 12/15/13  11/08/2013    PET 11/23/2013 1. Dominant sub solid left lower lobe nodule does not demonstrate significantly increased metabolic activity.  However, morphologically, adenocarcinoma is still a concern.   - Spirometry 11/24/13 wnl  - 11/24/2013 T surg eval rec> LLower lobectomy 12/15/2013 for adenoca   . CAD (coronary artery disease), non obstructive by cardiac cath 12/12/13 01/24/2014  . Basal cell carcinoma     "cut them off face & right shoulder" (11/08/2013)  . Diastolic CHF   . CKD (chronic kidney disease), stage III   . Nephropathy   . NSTEMI (non-ST elevation myocardial infarction) 05/2014   Past Surgical History  Procedure Laterality Date  . Knee arthroscopy Bilateral   . Anterior cervical decomp/discectomy fusion  2000    C5-C6  . Renal artery stent Right 11/08/2013    Archie Endo 11/08/2013  . Tonsillectomy and adenoidectomy  ?1946  . Cholecystectomy  1970's  . Vaginal hysterectomy  1970  . Dilation and curettage of uterus  1960's  . Skin cancer excision    . Coronary angiogram      Hx: of 2/ 2015  . Cardiac catheterization      Hx: of 2/ 2015  . Video bronchoscopy N/A 12/15/2013    Procedure: VIDEO BRONCHOSCOPY;  Surgeon: Grace Isaac, MD;  Location: [facility] OR;  Service: Thoracic;  Laterality: N/A;  . Video assisted thoracoscopy (vats)/wedge resection Left 12/15/2013    Procedure: VIDEO ASSISTED THORACOSCOPY (VATS)/WEDGE RESECTION;  Surgeon: Grace Isaac, MD;  Location: [facility];  Service: Thoracic;  Laterality: Left;  . Lobectomy Left 12/15/2013    Procedure: LOBECTOMY;  Surgeon: Grace Isaac, MD;  Location: [place];  Service: Thoracic;  Laterality: Left;   Social History:  reports that she has never smoked. She has never  used smokeless tobacco. She reports that she does not drink alcohol or use illicit drugs. Home;  where does patient live--home, ALF, SNF? and with whom if at home? Yes;  Can patient participate in ADLs?  Allergies  Allergen Reactions  . Benazepril Hcl Anaphylaxis and Cough  . Loratadine Anaphylaxis    anaphylaxis  . Celecoxib     REACTION: aching  . Lisinopril     REACTION: cough  . Allegra  [Fexofenadine Hcl]     Severe back pain  . Sulfa Antibiotics Hives    Family History  Problem Relation Age of Onset  . Breast cancer Sister   . Emphysema Father     smoked  . Allergies Sister   . Allergies Brother   . Heart disease Mother   . Heart disease Father   . Diabetes Father   . Diabetes Brother     (be sure to complete)  Prior to Admission medications   Medication Sig Start Date End Date Taking? Authorizing Provider  albuterol (PROVENTIL HFA;VENTOLIN HFA) 108 (90 BASE) MCG/ACT inhaler Inhale 1-2 puffs into the lungs every 6 (six) hours as needed for wheezing or shortness of breath. 12/31/13  Yes Grace Isaac, MD  ALPRAZolam Duanne Moron) 0.5 MG tablet Take 0.5 mg by mouth at bedtime.    Yes Historical Provider, MD  Calcium Carb-Cholecalciferol 600-800 MG-UNIT TABS Take 1 tablet by mouth daily.   Yes Historical Provider, MD  cetirizine (ZYRTEC) 10 MG tablet Take 10 mg by mouth at bedtime.    Yes Historical Provider, MD  DULERA 100-5 MCG/ACT AERO Inhale 2 puffs into the lungs 2 (two) times daily as needed for shortness of breath.  04/13/14  Yes Historical Provider, MD  famotidine (PEPCID) 20 MG tablet Take 20 mg by mouth 2 (two) times daily.   Yes Historical Provider, MD  furosemide (LASIX) 80 MG tablet Take 40 mg by mouth 2 (two) times daily.   Yes Historical Provider, MD  hydrALAZINE (APRESOLINE) 50 MG tablet Take 50 mg by mouth daily.   Yes Historical Provider, MD  levothyroxine (SYNTHROID, LEVOTHROID) 88 MCG tablet Take 88 mcg by mouth daily before breakfast.   Yes Historical Provider, MD  nebivolol (BYSTOLIC) 10 MG tablet Take 20 mg by mouth daily.   Yes Historical Provider, MD  pantoprazole (PROTONIX) 40 MG tablet Take 1 tablet (40 mg total) by mouth daily. Take 30-60 min before first meal of the day 04/22/14  Yes Tanda Rockers, MD  polyethylene glycol powder (GLYCOLAX/MIRALAX) powder Take 17 g by mouth daily. 06/09/14  Yes [name], MD  predniSONE (DELTASONE) 50 MG  tablet Take 50 mg by mouth daily with breakfast.    Yes Historical Provider, MD  Blood Glucose Monitoring Suppl (ONE TOUCH ULTRA 2) W/DEVICE KIT 1 Device by Does not apply route once. 04/01/14   Renato Shin, MD  glucose blood (ONE TOUCH ULTRA TEST) test strip Pt to test 3-4x daily due to labile sugars Dx. 160.73 04/20/14   [name], MD  Lancets [facility] ULTRASOFT) lancets Use 1 per day dx code 249.00 04/19/14   Elayne Snare, MD  [facility] DELICA LANCETS 71G MISC Use to check blood sugar once daily as instructed. Dx code: 790.29 04/19/14   Renato Shin, MD   Physical Exam: Filed Vitals:   06/13/14 1205  BP: 159/58  Pulse: 62  Temp:   Resp: 14     General:  alert  Eyes: eom-i  ENT: no oral ulcers   Neck: supple,  Cardiovascular: s1,s2 rrr  Respiratory: CTA BL  Abdomen: soft, obese, nt  Skin: ecchymosis   Musculoskeletal: LE edema  Psychiatric: no hallucinations   Neurologic: CN 2-12 intact, motor 5/5 BL symmetric   Labs on Admission:  Basic Metabolic Panel:  Recent Labs Lab 06/09/14 1237 06/13/14 0941  NA 140 142  K 3.4* 3.9  CL 97 97  CO2 29 29  GLUCOSE 130* 115*  BUN 74* 69*  CREATININE 2.1* 1.77*  CALCIUM 9.1 9.2   Liver Function Tests: No results found for this basename: AST, ALT, ALKPHOS, BILITOT, PROT, ALBUMIN,  in the last 168 hours No results found for this basename: LIPASE, AMYLASE,  in the last 168 hours No results found for this basename: AMMONIA,  in the last 168 hours CBC:  Recent Labs Lab 06/13/14 0941  WBC 14.0*  NEUTROABS 12.3*  HGB 11.7*  HCT 35.1*  MCV 89.3  PLT 203   Cardiac Enzymes:  Recent Labs Lab 06/13/14 0941  TROPONINI <0.30    BNP (last 3 results)  Recent Labs  05/27/14 1153 05/30/14 0614 06/13/14 0941  PROBNP 10297.0* 10599.0* 7258.0*   CBG: No results found for this basename: GLUCAP,  in the last 168 hours  Radiological Exams on Admission: Dg Chest 2 View  06/13/2014   CLINICAL DATA:   Hypotension  EXAM: CHEST  2 VIEW  COMPARISON:  May 27, 2014  FINDINGS: Apparent loculated effusion on the left is stable. There is no edema consolidation. No new opacity. Heart is upper normal in size with pulmonary vascularity within normal limits. No adenopathy. There is postoperative change in the lower cervical spine.  IMPRESSION: Loculated effusion left base, stable. No edema or consolidation. No new opacity compared to most recent prior study. Heart prominent but stable.   Electronically Signed   By: Lowella Grip M.D.   On: 06/13/2014 10:08   Ct Chest Wo Contrast  06/13/2014   CLINICAL DATA:  Left pleural effusion.  EXAM: CT CHEST WITHOUT CONTRAST  TECHNIQUE: Multidetector CT imaging of the chest was performed following the standard protocol without IV contrast.  COMPARISON:  Chest CTs dated 02/17/2014 and 11/05/2013  FINDINGS: There is minimal chronic pleural thickening at the left lung base with blunting of the costophrenic angle. The patient has had previous surgery with left lower lobectomy. There is a tiny amount of loculated fluid at the left lung base as well as a tiny left pericardial effusion. The findings have diminished since the prior CT scan.  There is a 2 mm nodule in the posterior medial aspect of the right upper lobe on image 19 of series 3, unchanged since 11/05/2013. There is no hilar or mediastinal adenopathy. Heart size is normal. Visualized portion of the upper abdomen is normal.  IMPRESSION: No acute abnormality. Slowly resolving pleural thickening and loculated fluid at the left lung base.  Stable 2 mm nodule in the right upper lobe.   Electronically Signed   By: Rozetta Nunnery M.D.   On: 06/13/2014 12:25    EKG: Independently reviewed.   Assessment/Plan Principal Problem:   Leukocytosis Active Problems:   HYPERTENSION   UTI (lower urinary tract infection)   73 y.o. female PMH of HTN, RAS s/p stent, CKD, CAD (non obstructive), h/o Lung CA s/p LL lobectomy ,  hypothyroidism,  IgA nephropathy (on prednsione) presents with dizziness, lightheadedness, and low blood pressure after taking her morning medications; -found to have mild UTI  1. Probable UTI, started on ATX, f/u cultures   2.  Generalized weakness; fatigue due to deconditioning; neuro exam no focal;  -Likely worse with UTI, on top of high dose steroids; obtain PT eval; check TSH 3. H/o Lung CA s/p LL lobectomy; not on chemo/rad -CT chest : No acute abnormality. Slowly resolving pleural thickening and  loculated fluid at the left lung base -No clinical s/s of pneumonia; no clear infiltrate on CT;  cont monitor  4. CKD; and IgA nephropathy (on prednsione)  -cont home regimen; on high dose steroids; add PPI for GI prevention  5. Per patient reported hypotension; BP is normal, hypertensive in ED  -? hydralazine takes once a day at home; hold hydralazine, cont BB, titrate per response  6. Steroid induced DM; monitor on ISS 7. Leukocytosis likely related to steroids; monitor, recheck in AM; Rx UTI 8. CAD (non obstructive), no active chest pain, ECG, initial trop negative; mild elevated BNP;  -cardiac cath 12/12/13 - continue BB, not on ASA  None;  if consultant consulted, please document name and whether formally or informally consulted  Code Status: full (must indicate code status--if unknown or must be presumed, indicate so) Family Communication:  D/w patient, her husband  (indicate person spoken with, if applicable, with phone number if by telephone) Disposition Plan: pend PT eval  (indicate anticipated LOS)  Time spent: >35 minutes   [place], [facility] Pager [phone]  If 7PM-7AM, please contact night-coverage [email or web address] Password [email or web address] 06/13/2014, 1:47 PM

## 2014-06-14 DIAGNOSIS — I5032 Chronic diastolic (congestive) heart failure: Secondary | ICD-10-CM

## 2014-06-14 DIAGNOSIS — R0989 Other specified symptoms and signs involving the circulatory and respiratory systems: Secondary | ICD-10-CM

## 2014-06-14 DIAGNOSIS — R7309 Other abnormal glucose: Secondary | ICD-10-CM

## 2014-06-14 DIAGNOSIS — R0609 Other forms of dyspnea: Secondary | ICD-10-CM

## 2014-06-14 LAB — URINE CULTURE

## 2014-06-14 LAB — T3, FREE: T3, Free: 1.9 pg/mL — ABNORMAL LOW (ref 2.3–4.2)

## 2014-06-14 LAB — CBC
HCT: 29.9 % — ABNORMAL LOW (ref 36.0–46.0)
HEMOGLOBIN: 9.8 g/dL — AB (ref 12.0–15.0)
MCH: 29.3 pg (ref 26.0–34.0)
MCHC: 32.8 g/dL (ref 30.0–36.0)
MCV: 89.3 fL (ref 78.0–100.0)
PLATELETS: 168 10*3/uL (ref 150–400)
RBC: 3.35 MIL/uL — ABNORMAL LOW (ref 3.87–5.11)
RDW: 19.5 % — ABNORMAL HIGH (ref 11.5–15.5)
WBC: 7.7 10*3/uL (ref 4.0–10.5)

## 2014-06-14 LAB — GLUCOSE, CAPILLARY
GLUCOSE-CAPILLARY: 169 mg/dL — AB (ref 70–99)
GLUCOSE-CAPILLARY: 202 mg/dL — AB (ref 70–99)
Glucose-Capillary: 111 mg/dL — ABNORMAL HIGH (ref 70–99)
Glucose-Capillary: 157 mg/dL — ABNORMAL HIGH (ref 70–99)

## 2014-06-14 LAB — T4, FREE: FREE T4: 2.28 ng/dL — AB (ref 0.80–1.80)

## 2014-06-14 LAB — TSH: TSH: 0.374 u[IU]/mL (ref 0.350–4.500)

## 2014-06-14 MED ORDER — NEBIVOLOL HCL 10 MG PO TABS
20.0000 mg | ORAL_TABLET | Freq: Every day | ORAL | Status: DC
  Administered 2014-06-14: 20 mg via ORAL
  Filled 2014-06-14: qty 2

## 2014-06-14 MED ORDER — FUROSEMIDE 40 MG PO TABS
40.0000 mg | ORAL_TABLET | Freq: Two times a day (BID) | ORAL | Status: DC
  Administered 2014-06-14 – 2014-06-15 (×2): 40 mg via ORAL
  Filled 2014-06-14 (×4): qty 1

## 2014-06-14 MED ORDER — FUROSEMIDE 80 MG PO TABS: 80.0000 mg | ORAL_TABLET | Freq: Two times a day (BID) | ORAL | Status: DC

## 2014-06-14 MED ORDER — HYDRALAZINE HCL 50 MG PO TABS
50.0000 mg | ORAL_TABLET | Freq: Every day | ORAL | Status: DC
  Administered 2014-06-14 – 2014-06-15 (×2): 50 mg via ORAL
  Filled 2014-06-14 (×3): qty 1

## 2014-06-14 NOTE — Progress Notes (Signed)
UR Completed.  Susan Pittman [phone] 06/14/2014

## 2014-06-14 NOTE — Care Management Note (Addendum)
    Page 1 of 1   06/15/2014     2:55:43 PM CARE MANAGEMENT NOTE 06/15/2014  Patient:  Susan Pittman, Susan Pittman   Account Number:  [id number]  Date Initiated:  06/14/2014  Documentation initiated by:  Tomi Bamberger  Subjective/Objective Assessment:   dx leukocytosis, weakness, dehydration, hx chf  admit- lives with spouse.     Action/Plan:   pt eval- rec outpt pt   Anticipated DC Date:  06/15/2014   Anticipated DC Plan:  [place]  CM consult      Choice offered to / List presented to:             Status of service:  Completed, signed off Medicare Important Message given?  NA - LOS <3 / Initial given by admissions (If response is "NO", the following Medicare IM given date fields will be blank) Date Medicare IM given:   Medicare IM given by:   Date Additional Medicare IM given:   Additional Medicare IM given by:    Discharge Disposition:  HOME/SELF CARE  Per UR Regulation:  Reviewed for med. necessity/level of care/duration of stay  If discussed at [place] of Stay Meetings, dates discussed:    Comments:  8/11!5 [place], BSN [phone] will monitor patient's bp for 24 hrs, plan for possible dc 8/12.  Physical therapy rec out pt pt.  NCM made referral to [facility] in [place], since patient states she was already going there, sent referral through epic.

## 2014-06-14 NOTE — Progress Notes (Signed)
PROGRESS NOTE  Susan Pittman UUV:[id number] DOB: [birth year] DOA: 06/13/2014 PCP: [name], MD  Brief Past history 73 year old female with a history of hypertension, renal artery stenosis, GERD, hypothyroidism, nonobstructive coronary artery disease and IgA nephropathy presents with dizziness, lightheadedness, and low blood pressure 30 minutes after taking her morning medications. On the day of admission, the patient was unable to get up out of her chair due to weakness and dizziness. The patient states that her symptoms have been worse over the past 2-3 days. The patient takes Bystolic and hydralazine in am. Losartan was recently d/ced by Dr. Lorrene Reid on 05/04/2014. Over the past 3 days, the patient was instructed to increase her furosemide dosing, although the patient was unable to tell me the exact doses and frequency. The dose of the furosemide was increased due to dyspnea on exertion and fluid overload. Apparently, the patient's weight in the office on 05/23/2014 was 235 pounds. At baseline, the patient takes furosemide 80 mg po bid. since increasing her furosemide dosing, the patient has noted slight worsening of her dizziness. On the morning of admission, the patient's systolic blood pressure dropped into the 90s with associated shortness of breath and dizziness. She continues to have dyspnea on exertion, but denies any orthopnea, PND. She states that the lower extremity edema has worsened in the past month and has not significantly improved. In addition, the patient recently came back from a trip from [place] on 05/22/2014. The patient denies any fevers, chills, chest discomfort, nausea, vomiting, diarrhea, abdominal pain, dysuria,hematuria, hematochezia, melena. She denies any syncope, focal extremity weakness, or visual disturbance.  The patient was diagnosed with renal artery stenosis status post stenting by Dr. Gwenlyn Found on 11/08/2013. The patient was subsequently hospitalized in  February and underwent cardiac catheterization on 12/12/2013 secondary to chest pain. The catheterization showed nonobstructive CAD with ejection fraction 65%. She had a CT of the chest on 11/05/2013 which showed an ill-defined left lower lobe mass. She underwent VATS procedure performed by Dr. Servando Snare on 12/15/2013. This was converted to a minithoracotomy when the frozen section revealed adenocarcinoma. She subsequently underwent a LLL which resection on that day final pathology showing invasive adenocarcinoma. She subsequently saw Dr. Julien Nordmann in April 2015 whom recommended surveillance CT of the chest in 6 months. He did not feel that adjuvant chemotherapy would enhance her survival. The patient developed worsening renal insufficiency and proteinuria. She underwent a renal biopsy on 02/25/2014 which revealed IgA nephropathy. The patient was placed on prednisone by Dr. Lorrene Reid.  Interim history 73 year old female with the above history presented with two-day history of generalized weakness. On the morning of admission, the patient felt lightheaded and dizzy. She took her blood pressure at home, and it was 102/69. As a result, the patient came to the emergency department as she has had similar problems in the recent past. The patient denied any chest discomfort, nausea, vomiting, diarrhea, abdominal pain, dysuria, hematuria. She has baseline dyspnea on exertion, but she stated that this has actually been improving with physical therapy. Assessment/Plan: Generalized weakness/dizziness -Due to dehydration/volume depletion from furosemide--patient has soft blood pressure at home when 102/69 -The patient's serum creatinine was noted to be 2.1 on 06/09/2014 when she visited her primary care provider -She was told to decrease her furosemide to 40 mg po twice a day which she did not do until 06/12/2014 -Continue furosemide 40 mg po bid for now and monitor -CBGs always >100 when she has these  dizziness  episodes CKD stage 3  -wide variations in pt's serum creatinine since Feb 2015  -Between May and June 2015--serum creatinine 1.8-2.0  -Serum creatinine 1.54 on the day of discharge on 05/30/14 -BMP in am IgA nephropathy  -continue prednisone--dose decreased from 100 to 50mg  daily on 05/25/14  -appreciate nephrology followup  -Follows Dr. Lorrene Reid Chronic diastolic CHF -BMP is elevated probably due to the patient's CKD -Her lower extremity edema and dyspnea have actually improved since her last admission from the hospital -She has chronic lower extremity edema secondary to her IgA nephropathy with proteinuria -05/28/14--EF 46-50%, grade 1 diastolic dysfunction, small pericardial effusion   Hypertension  -Continue hydralazine--please note that this has been changed to once daily by Dr. Lorrene Reid the day prior to admission--take in morning  -Continue Bystolic--changed to evening dosing  -no futher hypotensive episodes or dizziness in hospital Hypothyroidism  -continue synthroid present dose -check TSH--0.209-->recheck in 4 weeks when stable Prednisone induced hyperglycemia  -HbA1c--7.4  -novolog SSI  -I expect her CBGs will continue to improve as the patient was weaned from her prednisone  -The patient will need to follow up with her primary care provider For further discussion of starting her on any hypoglycemic agents-at this time, the patient did not want to be started on any medications pending discussion with her primary care provider    Family Communication:   Husband updated at beside Disposition Plan:   Home when medically stable      Procedures/Studies: Dg Chest 2 View  06/13/2014   CLINICAL DATA:  Hypotension  EXAM: CHEST  2 VIEW  COMPARISON:  May 27, 2014  FINDINGS: Apparent loculated effusion on the left is stable. There is no edema consolidation. No new opacity. Heart is upper normal in size with pulmonary vascularity within normal limits. No adenopathy. There is  postoperative change in the lower cervical spine.  IMPRESSION: Loculated effusion left base, stable. No edema or consolidation. No new opacity compared to most recent prior study. Heart prominent but stable.   Electronically Signed   By: Lowella Grip M.D.   On: 06/13/2014 10:08   Dg Chest 2 View  05/27/2014   CLINICAL DATA:  Dizziness, low blood pressure  EXAM: CHEST  2 VIEW  COMPARISON:  04/12/2014, CT chest 02/17/2014  FINDINGS: There is a loculated left lower lung pleural effusion. There is no focal consolidation, pneumothorax or right pleural effusion. Stable cardiomediastinal silhouette. Unremarkable osseous structures.  IMPRESSION: No acute cardiopulmonary disease. Loculated left lower pleural effusion.   Electronically Signed   By: Kathreen Devoid   On: 05/27/2014 12:17   Ct Chest Wo Contrast  06/13/2014   CLINICAL DATA:  Left pleural effusion.  EXAM: CT CHEST WITHOUT CONTRAST  TECHNIQUE: Multidetector CT imaging of the chest was performed following the standard protocol without IV contrast.  COMPARISON:  Chest CTs dated 02/17/2014 and 11/05/2013  FINDINGS: There is minimal chronic pleural thickening at the left lung base with blunting of the costophrenic angle. The patient has had previous surgery with left lower lobectomy. There is a tiny amount of loculated fluid at the left lung base as well as a tiny left pericardial effusion. The findings have diminished since the prior CT scan.  There is a 2 mm nodule in the posterior medial aspect of the right upper lobe on image 19 of series 3, unchanged since 11/05/2013. There is no hilar or mediastinal adenopathy. Heart size is normal. Visualized portion of the upper abdomen is normal.  IMPRESSION: No acute  abnormality. Slowly resolving pleural thickening and loculated fluid at the left lung base.  Stable 2 mm nodule in the right upper lobe.   Electronically Signed   By: Rozetta Nunnery M.D.   On: 06/13/2014 12:25   Nm Pulmonary Perf And Vent  05/27/2014    CLINICAL DATA:  Shortness of breath  EXAM: NUCLEAR MEDICINE VENTILATION - PERFUSION LUNG SCAN  TECHNIQUE: Ventilation images were obtained in multiple projections using inhaled aerosol technetium 99 M DTPA. Perfusion images were obtained in multiple projections after intravenous injection of Tc-61m MAA.  RADIOPHARMACEUTICALS:  40 mCi Tc-68m DTPA aerosol and 6 mCi Tc-91m MAA  COMPARISON:  None.  FINDINGS: Ventilation: Slight decreased ventilation in the lingula. Central deposition of radiotracer as can be seen with lower airways disease. No other ventilatory defect. Relative hypoventilation of the left lung compared to the right.  Perfusion: No wedge shaped peripheral perfusion defects to suggest acute pulmonary embolism. Matching perfusion defect in the lingula with corresponding chest x-ray abnormality.  IMPRESSION: 1. Low probability for pulmonary embolus.   Electronically Signed   By: Kathreen Devoid   On: 05/27/2014 16:47         Subjective: Patient denies fevers, chills, headache, chest pain, dyspnea, nausea, vomiting, diarrhea, abdominal pain, dysuria, hematuria   Objective: Filed Vitals:   06/13/14 1427 06/13/14 2133 06/14/14 0459 06/14/14 0846  BP: 180/83 150/71 158/80   Pulse: 65  61   Temp: 98.2 F (36.8 C) 97.7 F (36.5 C) 97.5 F (36.4 C) 98.3 F (36.8 C)  TempSrc: Oral Oral Oral Oral  Resp: 20 20 18 20   Height: 5\' 5"  (1.651 m)     Weight: 91.989 kg (202 lb 12.8 oz)     SpO2: 92% 92% 97% 95%    Intake/Output Summary (Last 24 hours) at 06/14/14 1227 Last data filed at 06/14/14 0851  Gross per 24 hour  Intake    240 ml  Output      0 ml  Net    240 ml   Weight change:  Exam:   General:  Pt is alert, follows commands appropriately, not in acute distress  HEENT: No icterus, No thrush,  [place]/AT  Cardiovascular: RRR, S1/S2, no rubs, no gallops  Respiratory: CTA bilaterally, no wheezing, no crackles, no rhonchi  Abdomen: Soft/+BS, non tender, non distended, no  guarding  Extremities: 2+LE edema, No lymphangitis, No petechiae, No rashes, no synovitis  Data Reviewed: Basic Metabolic Panel:  Recent Labs Lab 06/09/14 1237 06/13/14 0941 06/13/14 1615  NA 140 142  --   K 3.4* 3.9  --   CL 97 97  --   CO2 29 29  --   GLUCOSE 130* 115*  --   BUN 74* 69*  --   CREATININE 2.1* 1.77* 1.81*  CALCIUM 9.1 9.2  --    Liver Function Tests: No results found for this basename: AST, ALT, ALKPHOS, BILITOT, PROT, ALBUMIN,  in the last 168 hours No results found for this basename: LIPASE, AMYLASE,  in the last 168 hours No results found for this basename: AMMONIA,  in the last 168 hours CBC:  Recent Labs Lab 06/13/14 0941 06/13/14 1615 06/14/14 0530  WBC 14.0* 10.9* 7.7  NEUTROABS 12.3*  --   --   HGB 11.7* 11.2* 9.8*  HCT 35.1* 33.8* 29.9*  MCV 89.3 89.9 89.3  PLT 203 183 168   Cardiac Enzymes:  Recent Labs Lab 06/13/14 0941  TROPONINI <0.30   BNP: No components found with this  basename: POCBNP,  CBG:  Recent Labs Lab 06/13/14 1653 06/13/14 2130 06/14/14 0819 06/14/14 1159  GLUCAP 235* 164* 111* 169*    Recent Results (from the past 240 hour(s))  URINE CULTURE     Status: None   Collection Time    06/13/14 10:14 AM      Result Value Ref Range Status   Specimen Description URINE, RANDOM   Final   Special Requests NONE   Final   Culture  Setup Time     Final   Value: 06/13/2014 10:14     Performed at [place]     Final   Value: 65,000 COLONIES/ML     Performed at [facility]   Culture     Final   Value: Multiple bacterial morphotypes present, none predominant. Suggest appropriate recollection if clinically indicated.     Performed at [facility]   Report Status 06/14/2014 FINAL   Final  CULTURE, BLOOD (ROUTINE X 2)     Status: None   Collection Time    06/13/14  1:40 PM      Result Value Ref Range Status   Specimen Description BLOOD LEFT ANTECUBITAL   Final   Special  Requests BOTTLES DRAWN AEROBIC AND ANAEROBIC 10CC   Final   Culture  Setup Time     Final   Value: 06/13/2014 19:03     Performed at [facility]   Culture     Final   Value:        BLOOD CULTURE RECEIVED NO GROWTH TO DATE CULTURE WILL BE HELD FOR 5 DAYS BEFORE ISSUING A FINAL NEGATIVE REPORT     Performed at [facility]   Report Status PENDING   Incomplete  CULTURE, BLOOD (ROUTINE X 2)     Status: None   Collection Time    06/13/14  1:55 PM      Result Value Ref Range Status   Specimen Description BLOOD RIGHT HAND   Final   Special Requests BOTTLES DRAWN AEROBIC ONLY 10CC   Final   Culture  Setup Time     Final   Value: 06/13/2014 19:01     Performed at [facility]   Culture     Final   Value:        BLOOD CULTURE RECEIVED NO GROWTH TO DATE CULTURE WILL BE HELD FOR 5 DAYS BEFORE ISSUING A FINAL NEGATIVE REPORT     Performed at [facility]   Report Status PENDING   Incomplete     Scheduled Meds: . ALPRAZolam  0.5 mg Oral QHS  . calcium-vitamin D  1 tablet Oral Q breakfast  . cefTRIAXone (ROCEPHIN)  IV  1 g Intravenous Q24H  . famotidine  20 mg Oral BID  . furosemide  40 mg Oral BID  . heparin  5,000 Units Subcutaneous 3 times per day  . hydrALAZINE  50 mg Oral Q breakfast  . insulin aspart  0-9 Units Subcutaneous TID WC  . levothyroxine  88 mcg Oral QAC breakfast  . nebivolol  20 mg Oral Daily  . pantoprazole  40 mg Oral Daily  . predniSONE  50 mg Oral Q breakfast   Continuous Infusions:    Jonaya Freshour, DO  [facility] Pager [phone]  If 7PM-7AM, please contact night-coverage [email or web address] Password [email or web address] 06/14/2014, 12:27 PM   LOS: 1 day

## 2014-06-14 NOTE — Progress Notes (Signed)
Physical Therapy Treatment Patient Details Name: Susan Pittman MRN: [id number] DOB: 11-20-1940 Today's Date: 06/14/2014    History of Present Illness Patient is a 73 y/o female presents with dizziness, lightheadedness, and low blood pressure after taking her morning medications. Patient also reports difficulty walking associated with generalized weakness. S/p fall out of her rollator chair prior to admission. PMH positive for HTN, RAS s/p stent, CAD (non obstructive), h/o Lung CA s/p LL lobectomy , hypothyroidism and IgA nephropathy (on prednsione)    PT Comments    Patient progressing with mobility. Provided education about importance of strengthening hips and glute muscular to improve gait mechanics. Instructed pt in exercise to perform at home during functional activities such as ADLs. Pt continues to fatigue however balance improved from previous session. Still requires assist getting into bed. Will continue to follow and progress as appropriate.   Follow Up Recommendations  Outpatient PT;Supervision for mobility/OOB     Equipment Recommendations  None recommended by PT    Recommendations for Other Services       Precautions / Restrictions Precautions Precautions: Fall Restrictions Weight Bearing Restrictions: No    Mobility  Bed Mobility Overal bed mobility: Needs Assistance Bed Mobility: Sit to Sidelying         Sit to sidelying: Min assist General bed mobility comments: Required Min A with LLE to return to supine with HOB flat, no rails utilizing log roll technique.  Transfers Overall transfer level: Needs assistance Equipment used: Rolling walker (2 wheeled) Transfers: Sit to/from Stand Sit to Stand: Min guard         General transfer comment: Stood from chair x2 with use of RW, no use of arm rests for ascent.  Ambulation/Gait Ambulation/Gait assistance: Min assist Ambulation Distance (Feet): 150 Feet Assistive device: Rolling walker (2 wheeled) Gait  Pattern/deviations: Step-through pattern;Trendelenburg;Trunk flexed     General Gait Details: Provided mod manual cues to facilitate neutral pelvis during stance phase of gait to minimize trendelenburg. Pt able to maintain pelvic positioning for 1-2 steps with min manual cues however requires constant feedback. Trendelenburg worsens when fatigued.   Stairs            Wheelchair Mobility    Modified Rankin (Stroke Patients Only)       Balance     Sitting balance-Leahy Scale: Good         Standing balance comment: Requires use of RW for any standing activity for safety.                    Cognition Arousal/Alertness: Awake/alert Behavior During Therapy: WFL for tasks assessed/performed Overall Cognitive Status: Within Functional Limits for tasks assessed                      Exercises General Exercises - Lower Extremity Ankle Circles/Pumps: Both;10 reps;Seated Hip Flexion/Marching: Both;20 reps;Seated Other Exercises Other Exercises: Performed sit <-> stand x15 with VC on technique and for slow descent to emphasize eccentric control of quadriceps.    General Comments        Pertinent Vitals/Pain Pain Assessment: No/denies pain    Home Living                      Prior Function            PT Goals (current goals can now be found in the care plan section) Progress towards PT goals: Progressing toward goals    Frequency  Min 3X/week  PT Plan Current plan remains appropriate    Co-evaluation             End of Session Equipment Utilized During Treatment: Gait belt Activity Tolerance: Patient tolerated treatment well Patient left: in bed;with call bell/phone within reach;with bed alarm set     Time: 6435-3912 PT Time Calculation (min): 27 min  Charges:  $Gait Training: 8-22 mins $Therapeutic Exercise: 8-22 mins                    G Codes:  Functional Assessment Tool Used: Clinical judgment Functional Limitation:  Mobility: Walking and moving around Mobility: Walking and Moving Around Current Status [phone]): At least 20 percent but less than 40 percent impaired, limited or restricted Mobility: Walking and Moving Around Goal Status [phone]): At least 1 percent but less than 20 percent impaired, limited or restricted   Candy Sledge A 06/14/2014, 1:55 PM Candy Sledge, [place], DPT [phone]

## 2014-06-15 ENCOUNTER — Telehealth: Payer: Self-pay

## 2014-06-15 ENCOUNTER — Ambulatory Visit: Payer: Medicare Other | Admitting: Physical Therapy

## 2014-06-15 DIAGNOSIS — I10 Essential (primary) hypertension: Secondary | ICD-10-CM

## 2014-06-15 DIAGNOSIS — I951 Orthostatic hypotension: Secondary | ICD-10-CM

## 2014-06-15 LAB — GLUCOSE, CAPILLARY
Glucose-Capillary: 89 mg/dL (ref 70–99)
Glucose-Capillary: 155 mg/dL — ABNORMAL HIGH (ref 70–99)

## 2014-06-15 MED ORDER — FUROSEMIDE 40 MG PO TABS: 40.0000 mg | ORAL_TABLET | Freq: Two times a day (BID) | ORAL | Status: DC

## 2014-06-15 NOTE — Progress Notes (Signed)
Patient discharge teaching given, including activity, diet, follow-up appoints, and medications. Patient verbalized understanding of all discharge instructions. IV access was d/c'd. Vitals are stable. Skin is intact except as charted in most recent assessments. Pt to be escorted out by Riccardo Dubin, to be driven home by family.

## 2014-06-15 NOTE — Discharge Summary (Signed)
PATIENT DETAILS Name: Susan Pittman Age: 73 y.o. Sex: female Date of Birth: 1941-07-29 MRN: [id number]. Admit Date: 06/13/2014 Admitting Physician: Kinnie Feil, MD OLM:[id number] [name], MD  Recommendations for Outpatient Follow-up:  1. Lasix dose decreased to 40 mg BID, please recheck BP/Orthostatics next visit 2. Follow-up with PCP with regards to recent hospitalizations due to generalized weakness episodes. Revisit current hypertension and diuretic medication regimen. Recheck CBC and CMP. Recheck Thyroid function in 4 weeks.  PRIMARY DISCHARGE DIAGNOSIS:  Principal Problem:   Leukocytosis Active Problems:   HYPERTENSION   UTI (lower urinary tract infection)      PAST MEDICAL HISTORY: Past Medical History  Diagnosis Date  . Asthma   . Hypertension   . GERD (gastroesophageal reflux disease)   . Allergy   . Hypothyroid   . Osteoporosis   . Gastric ulcer   . Hiatal hernia   . Hyperlipidemia     "don't take RX for it" (11/08/2013)  . Renal artery stenosis     with stent placement  . Lung mass     superior segment LLL/notes 11/08/2013  . Pneumonia     "I've had it ?3 times" (11/08/2013)  . Arthritis     "knees, hips, back, hands" (11/08/2013)  . DJD (degenerative joint disease)   . Lung nodule/ adenoca > LLobectomy 12/15/13  11/08/2013    PET 11/23/2013 1. Dominant sub solid left lower lobe nodule does not demonstrate significantly increased metabolic activity. However, morphologically, adenocarcinoma is still a concern.   - Spirometry 11/24/13 wnl  - 11/24/2013 T surg eval rec> LLower lobectomy 12/15/2013 for adenoca   . CAD (coronary artery disease), non obstructive by cardiac cath 12/12/13 01/24/2014  . Basal cell carcinoma     "cut them off face & right shoulder" (11/08/2013)  . Diastolic CHF   . CKD (chronic kidney disease), stage III   . Nephropathy     DISCHARGE MEDICATIONS:   Medication List         albuterol 108 (90 BASE) MCG/ACT inhaler  Commonly known as:   PROVENTIL HFA;VENTOLIN HFA  Inhale 1-2 puffs into the lungs every 6 (six) hours as needed for wheezing or shortness of breath.     ALPRAZolam 0.5 MG tablet  Commonly known as:  XANAX  Take 0.5 mg by mouth at bedtime.     Calcium Carb-Cholecalciferol 600-800 MG-UNIT Tabs  Take 1 tablet by mouth daily.     DULERA 100-5 MCG/ACT Aero  Generic drug:  mometasone-formoterol  Inhale 2 puffs into the lungs 2 (two) times daily as needed for shortness of breath.     famotidine 20 MG tablet  Commonly known as:  PEPCID  Take 20 mg by mouth 2 (two) times daily.     furosemide 40 MG tablet  Commonly known as:  LASIX  Take 1 tablet (40 mg total) by mouth 2 (two) times daily.     glucose blood test strip  Commonly known as:  ONE TOUCH ULTRA TEST  Pt to test 3-4x daily due to labile sugars Dx. 250.02     hydrALAZINE 50 MG tablet  Commonly known as:  APRESOLINE  Take 50 mg by mouth daily.     levothyroxine 88 MCG tablet  Commonly known as:  SYNTHROID, LEVOTHROID  Take 88 mcg by mouth daily before breakfast.     nebivolol 10 MG tablet  Commonly known as:  BYSTOLIC  Take 20 mg by mouth daily.     ONE TOUCH ULTRA 2 W/DEVICE  Kit  1 Device by Does not apply route once.     onetouch ultrasoft lancets  Use 1 per day dx code 413.24     ONETOUCH DELICA LANCETS 40N Misc  Use to check blood sugar once daily as instructed. Dx code: 790.29     pantoprazole 40 MG tablet  Commonly known as:  PROTONIX  Take 1 tablet (40 mg total) by mouth daily. Take 30-60 min before first meal of the day     polyethylene glycol powder powder  Commonly known as:  GLYCOLAX/MIRALAX  Take 17 g by mouth daily.     predniSONE 50 MG tablet  Commonly known as:  DELTASONE  Take 50 mg by mouth daily with breakfast.     ZYRTEC 10 MG tablet  Generic drug:  cetirizine  Take 10 mg by mouth at bedtime.        ALLERGIES:   Allergies  Allergen Reactions  . Benazepril Hcl Anaphylaxis and Cough  . Loratadine  Anaphylaxis    anaphylaxis  . Celecoxib     REACTION: aching  . Lisinopril     REACTION: cough  . Allegra [Fexofenadine Hcl]     Severe back pain  . Sulfa Antibiotics Hives    BRIEF HPI:  See H&P, Labs, Consult and Test reports for all details in brief. Patient with past medical history of hypertension, renal artery stenosis, GERD, hypothyroidism, nonobstructive coronary artery disease and IgA nephropathy presented with dizziness, lightheadedness, and low blood pressure 30 minutes after taking her morning medications. On the day of admission, the patient was unable to get up out of her chair due to weakness and dizziness. The patient stated that her symptoms had been worse over the past 2-3 days. She had been admitted to the hospital on 8/24 for a similar episode.   CONSULTATIONS:   None  PERTINENT RADIOLOGIC STUDIES: Dg Chest 2 View  06/13/2014   CLINICAL DATA:  Hypotension  EXAM: CHEST  2 VIEW  COMPARISON:  May 27, 2014  FINDINGS: Apparent loculated effusion on the left is stable. There is no edema consolidation. No new opacity. Heart is upper normal in size with pulmonary vascularity within normal limits. No adenopathy. There is postoperative change in the lower cervical spine.  IMPRESSION: Loculated effusion left base, stable. No edema or consolidation. No new opacity compared to most recent prior study. Heart prominent but stable.   Electronically Signed   By: Lowella Grip M.D.   On: 06/13/2014 10:08   Dg Chest 2 View  05/27/2014   CLINICAL DATA:  Dizziness, low blood pressure  EXAM: CHEST  2 VIEW  COMPARISON:  04/12/2014, CT chest 02/17/2014  FINDINGS: There is a loculated left lower lung pleural effusion. There is no focal consolidation, pneumothorax or right pleural effusion. Stable cardiomediastinal silhouette. Unremarkable osseous structures.  IMPRESSION: No acute cardiopulmonary disease. Loculated left lower pleural effusion.   Electronically Signed   By: Kathreen Devoid   On:  05/27/2014 12:17   Ct Chest Wo Contrast  06/13/2014   CLINICAL DATA:  Left pleural effusion.  EXAM: CT CHEST WITHOUT CONTRAST  TECHNIQUE: Multidetector CT imaging of the chest was performed following the standard protocol without IV contrast.  COMPARISON:  Chest CTs dated 02/17/2014 and 11/05/2013  FINDINGS: There is minimal chronic pleural thickening at the left lung base with blunting of the costophrenic angle. The patient has had previous surgery with left lower lobectomy. There is a tiny amount of loculated fluid at the left lung base as  well as a tiny left pericardial effusion. The findings have diminished since the prior CT scan.  There is a 2 mm nodule in the posterior medial aspect of the right upper lobe on image 19 of series 3, unchanged since 11/05/2013. There is no hilar or mediastinal adenopathy. Heart size is normal. Visualized portion of the upper abdomen is normal.  IMPRESSION: No acute abnormality. Slowly resolving pleural thickening and loculated fluid at the left lung base.  Stable 2 mm nodule in the right upper lobe.   Electronically Signed   By: Rozetta Nunnery M.D.   On: 06/13/2014 12:25   Nm Pulmonary Perf And Vent  05/27/2014   CLINICAL DATA:  Shortness of breath  EXAM: NUCLEAR MEDICINE VENTILATION - PERFUSION LUNG SCAN  TECHNIQUE: Ventilation images were obtained in multiple projections using inhaled aerosol technetium 99 M DTPA. Perfusion images were obtained in multiple projections after intravenous injection of Tc-30mMAA.  RADIOPHARMACEUTICALS:  40 mCi Tc-960mTPA aerosol and 6 mCi Tc-9954mA  COMPARISON:  None.  FINDINGS: Ventilation: Slight decreased ventilation in the lingula. Central deposition of radiotracer as can be seen with lower airways disease. No other ventilatory defect. Relative hypoventilation of the left lung compared to the right.  Perfusion: No wedge shaped peripheral perfusion defects to suggest acute pulmonary embolism. Matching perfusion defect in the lingula with  corresponding chest x-ray abnormality.  IMPRESSION: 1. Low probability for pulmonary embolus.   Electronically Signed   By: HetKathreen DevoidOn: 05/27/2014 16:47     PERTINENT LAB RESULTS: CBC:  Recent Labs  06/13/14 1615 06/14/14 0530  WBC 10.9* 7.7  HGB 11.2* 9.8*  HCT 33.8* 29.9*  PLT 183 168   CMET CMP     Component Value Date/Time   NA 142 06/13/2014 0941   K 3.9 06/13/2014 0941   CL 97 06/13/2014 0941   CO2 29 06/13/2014 0941   GLUCOSE 115* 06/13/2014 0941   BUN 69* 06/13/2014 0941   CREATININE 1.81* 06/13/2014 1615   CREATININE 2.34* 02/14/2014 1030   CALCIUM 9.2 06/13/2014 0941   PROT 5.8* 04/12/2014 1527   ALBUMIN 3.0* 04/12/2014 1527   AST 19 04/12/2014 1527   ALT 23 04/12/2014 1527   ALKPHOS 57 04/12/2014 1527   BILITOT 0.3 04/12/2014 1527   GFRNONAA 26* 06/13/2014 1615   GFRAA 30* 06/13/2014 1615  No components found with this basename: POCBNP,  No results found for this basename: DDIMER,  in the last 72 hours No results found for this basename: HGBA1C,  in the last 72 hours No results found for this basename: CHOL, HDL, LDLCALC, TRIG, CHOLHDL, LDLDIRECT,  in the last 72 hours  Recent Labs  06/14/14 1142  TSH 0.374  T3FREE 1.9*   Coags: No results found for this basename: PT, INR,  in the last 72 hours Microbiology: Recent Results (from the past 240 hour(s))  URINE CULTURE     Status: None   Collection Time    06/13/14 10:14 AM      Result Value Ref Range Status   Specimen Description URINE, RANDOM   Final   Special Requests NONE   Final   Culture  Setup Time     Final   Value: 06/13/2014 10:14     Performed at [facility]  Final   Value: 65,000 COLONIES/ML     [facility]     Final   Value: Multiple  bacterial morphotypes present, none predominant. Suggest appropriate recollection if clinically indicated.     Performed at [facility]   Report Status 06/14/2014 FINAL   Final  CULTURE, BLOOD  (ROUTINE X 2)     Status: None   Collection Time    06/13/14  1:40 PM      Result Value Ref Range Status   Specimen Description BLOOD LEFT ANTECUBITAL   Final   Special Requests BOTTLES DRAWN AEROBIC AND ANAEROBIC 10CC   Final   Culture  Setup Time     Final   Value: 06/13/2014 19:03     Performed at [facility]   Culture     Final   Value:        BLOOD CULTURE RECEIVED NO GROWTH TO DATE CULTURE WILL BE HELD FOR 5 DAYS BEFORE ISSUING A FINAL NEGATIVE REPORT     Performed at [facility]   Report Status PENDING   Incomplete  CULTURE, BLOOD (ROUTINE X 2)     Status: None   Collection Time    06/13/14  1:55 PM      Result Value Ref Range Status   Specimen Description BLOOD RIGHT HAND   Final   Special Requests BOTTLES DRAWN AEROBIC ONLY 10CC   Final   Culture  Setup Time     Final   Value: 06/13/2014 19:01     Performed at [facility]   Culture     Final   Value:        BLOOD CULTURE RECEIVED NO GROWTH TO DATE CULTURE WILL BE HELD FOR 5 DAYS BEFORE ISSUING A FINAL NEGATIVE REPORT     Performed at [facility]   Report Status PENDING   Incomplete    BRIEF HOSPITAL COURSE:   Principal Problem:   Leukocytosis Active Problems:   HYPERTENSION   UTI (lower urinary tract infection)    Generalized weakness/dizziness  -Likely to dehydration/volume depletion from furosemide--patient has soft blood pressure at home when 102/69  -The patient's serum creatinine was noted to be 2.1 on 06/09/2014 when she visited her primary care provider  -She was told to decrease her furosemide to 40 mg po twice a day which she did not do until 06/12/2014  -Continued furosemide 40 mg po bid; patient responded well to decreased diuretic dose, still has leg some mild leg edema and weight stable at 202 lbs. Will need further optimization of her medications upon follow up with PCP and Primary Nephrologist. -CBGs always >100 when she has these dizziness episodes  -educated  patient that episodes likely due to combination of dehydration/volume depletion due to high diuretic dose coupled with HTN medications  CKD stage 3  -wide variations in pt's serum creatinine since Feb 2015  -Between May and June 2015--serum creatinine 1.8-2.0  -Serum creatinine 1.54 on the day of discharge on 05/30/14  -creatinine remained within baseline range throughout inpatient  IgA nephropathy  -continued prednisone--dose decreased from 100 to 3m daily on 05/25/14  -appreciate nephrology followup  -Follows Dr. DWilfred Lacyappt scheduled for 8/21 by Dr GSloan Leiter  Normocytic Anemia - likely a result of CKD and IgA nephropathy - stable during hospitalization  Chronic diastolic CHF  -BNP is elevated probably due to the patient's CKD  -Her lower extremity edema and dyspnea have actually improved since her last admission from the hospital  -She has chronic lower extremity edema secondary to her IgA nephropathy with proteinuria  -05/28/14--EF 532-91% grade 1 diastolic dysfunction, small pericardial  effusion   Hypertension  -Continued hydralazine-was changed to once daily in morning by Dr. Lorrene Reid the day prior to admission - Educated patient regarding taking blood pressure prior to taking HTN medications in morning; to withhold Hydralazine if blood pressure is lower than normal to prevent recurrent weakness episodes from occurring -Continue Bystolic--changed to evening dosing  -no futher hypotensive episodes or dizziness in hospital   Hypothyroidism  - continued synthroid present dose  - TSH dropped to 0.209 in hospital -->recheck in 4 weeks when stable   Prednisone induced hyperglycemia  -HbA1c--7.4  -CBG stable during hospitalization -expect her CBGs will continue to improve as the patient was weaned from her prednisone  -The patient will need to follow up with her primary care provider for further discussion of starting her on any hypoglycemic agents-at this time, the patient  did not want to be started on any medications pending discussion with her primary care provider    TODAY-DAY OF DISCHARGE:  Subjective:   Susan Pittman today has no headache,no chest or abdominal pain,no new weakness tingling or numbness, feels much better wants to go home today.   Objective:   Blood pressure 157/82, pulse 66, temperature 98.4 F (36.9 C), temperature source Oral, resp. rate 18, height 5' 5"  (1.651 m), weight 91.989 kg (202 lb 12.8 oz), SpO2 100.00%.  Intake/Output Summary (Last 24 hours) at 06/15/14 1215 Last data filed at 06/14/14 2100  Gross per 24 hour  Intake    240 ml  Output    450 ml  Net   -210 ml   Filed Weights   06/13/14 1427  Weight: 91.989 kg (202 lb 12.8 oz)    Exam Awake Alert, Oriented *3, No new F.N deficits, Normal affect [place].AT,PERRAL Supple Neck,No JVD, No cervical lymphadenopathy appreciated.  Symmetrical Chest wall movement, Good air movement bilaterally, CTAB RRR,No Gallops,Rubs or new Murmurs, No Parasternal Heave + B.Sounds, Abd Soft, Non tender, No organomegaly appreciated, No rebound -guarding or rigidity. No Cyanosis or clubbing, 1+ edema in lower extremities, no pain in extremities, No new Rash or bruise  DISCHARGE CONDITION: Stable  DISPOSITION: Home  DISCHARGE INSTRUCTIONS:    Activity:  As tolerated with Full fall precautions use walker  Diet recommendation: Diabetic/Heart Healthy diet Discharge Instructions   (HEART FAILURE PATIENTS) Call MD:  Anytime you have any of the following symptoms: 1) 3 pound weight gain in 24 hours or 5 pounds in 1 week 2) shortness of breath, with or without a dry hacking cough 3) swelling in the hands, feet or stomach 4) if you have to sleep on extra pillows at night in order to breathe.    Complete by:  As directed      Call MD for:  persistant dizziness or light-headedness    Complete by:  As directed      Diet - low sodium heart healthy    Complete by:  As directed      Increase  activity slowly    Complete by:  As directed      Walker     Complete by:  As directed            Follow-up Information   Follow up with [name], MD On 06/23/2014. (Appointment with Dr. [name] is on 06/23/14 at 11:30)    Specialty:  Family Medicine   Contact information:   [phone] [address] [phone]       Follow up with Lucrezia Starch, MD On 06/24/2014. (APPT AT  12 NOON-be there 15 minutes early)    Specialty:  Nephrology   Contact information:   [place] [address] [phone]         Total Time spent on discharge equals 45 minutes.  Signed: Lazarus Gowda [facility]  06/15/2014 12:15 PM  **Disclaimer: This note may have been dictated with voice recognition software. Similar sounding words can inadvertently be transcribed and this note may contain transcription errors which may not have been corrected upon publication of note.**  Attending Patient was seen, examined,treatment plan was discussed with the Physician extender. I have directly reviewed the clinical findings, lab, imaging studies and management of this patient in detail. I have made the necessary changes to the above noted documentation, and agree with the documentation, as recorded by the Physician extender.  Nena Alexander MD [facility].

## 2014-06-15 NOTE — Telephone Encounter (Signed)
Admit Date: 06/13/2014 Discharge Date: 06/15/14  Pt has been home x 1 hour at the time of call.  States she feels much better than she did whenever she went into the hospital.  She is still a little weak and fatigue, but stable.  She remains independent of ADLs, but is using walker to ambulate for stability.  Knows to monitor BP at 1000 and between 5:30 and 6pm, monitor BS 3-4 times per day,  increase activity slowly, consume diabetic/heart healthy diet, and weigh daily.  Pt was advised to call PCP if she experiences persistent dizziness or lightheadedness.  She stated understanding and agreed.  Medication and allergies:  Reviewed and updated Knows to hold hydralazine if 1000 SBP is <120.   Local pharmacy:  [facility], [place] [facility] [place]  No changes to personal, family history or past surgical hx  Hospital follow up appointment was already scheduled for 06/23/14 @ 11:30 am.

## 2014-06-16 ENCOUNTER — Ambulatory Visit: Payer: Medicare Other | Admitting: Physician Assistant

## 2014-06-17 ENCOUNTER — Ambulatory Visit: Payer: Medicare Other | Admitting: Rehabilitation

## 2014-06-20 ENCOUNTER — Ambulatory Visit: Payer: Medicare Other | Admitting: Physical Therapy

## 2014-06-20 DIAGNOSIS — M25559 Pain in unspecified hip: Secondary | ICD-10-CM | POA: Diagnosis not present

## 2014-06-20 DIAGNOSIS — M6281 Muscle weakness (generalized): Secondary | ICD-10-CM | POA: Diagnosis not present

## 2014-06-20 DIAGNOSIS — I509 Heart failure, unspecified: Secondary | ICD-10-CM | POA: Diagnosis not present

## 2014-06-20 DIAGNOSIS — Z85118 Personal history of other malignant neoplasm of bronchus and lung: Secondary | ICD-10-CM | POA: Diagnosis not present

## 2014-06-20 DIAGNOSIS — R262 Difficulty in walking, not elsewhere classified: Secondary | ICD-10-CM | POA: Diagnosis not present

## 2014-06-20 DIAGNOSIS — IMO0001 Reserved for inherently not codable concepts without codable children: Secondary | ICD-10-CM | POA: Diagnosis not present

## 2014-06-20 DIAGNOSIS — I10 Essential (primary) hypertension: Secondary | ICD-10-CM | POA: Diagnosis not present

## 2014-06-20 LAB — CULTURE, BLOOD (ROUTINE X 2)
CULTURE: NO GROWTH
Culture: NO GROWTH

## 2014-06-22 ENCOUNTER — Ambulatory Visit: Payer: Medicare Other | Admitting: Rehabilitation

## 2014-06-23 ENCOUNTER — Encounter: Payer: Self-pay | Admitting: Family Medicine

## 2014-06-23 ENCOUNTER — Other Ambulatory Visit: Payer: Self-pay | Admitting: Family Medicine

## 2014-06-23 ENCOUNTER — Ambulatory Visit: Payer: Medicare Other | Admitting: Physical Therapy

## 2014-06-23 ENCOUNTER — Ambulatory Visit (INDEPENDENT_AMBULATORY_CARE_PROVIDER_SITE_OTHER): Payer: Medicare Other | Admitting: Family Medicine

## 2014-06-23 VITALS — BP 150/78 | HR 64 | Temp 97.9°F | Resp 16 | Wt 226.4 lb

## 2014-06-23 DIAGNOSIS — R5381 Other malaise: Secondary | ICD-10-CM | POA: Insufficient documentation

## 2014-06-23 DIAGNOSIS — I5032 Chronic diastolic (congestive) heart failure: Secondary | ICD-10-CM

## 2014-06-23 DIAGNOSIS — N184 Chronic kidney disease, stage 4 (severe): Secondary | ICD-10-CM | POA: Diagnosis not present

## 2014-06-23 DIAGNOSIS — D72829 Elevated white blood cell count, unspecified: Secondary | ICD-10-CM

## 2014-06-23 LAB — HEPATIC FUNCTION PANEL
ALT: 27 U/L (ref 0–35)
AST: 23 U/L (ref 0–37)
Albumin: 3.3 g/dL — ABNORMAL LOW (ref 3.5–5.2)
Alkaline Phosphatase: 46 U/L (ref 39–117)
BILIRUBIN DIRECT: 0 mg/dL (ref 0.0–0.3)
Total Bilirubin: 0.9 mg/dL (ref 0.2–1.2)
Total Protein: 6 g/dL (ref 6.0–8.3)

## 2014-06-23 LAB — BASIC METABOLIC PANEL
BUN: 67 mg/dL — ABNORMAL HIGH (ref 6–23)
CALCIUM: 9 mg/dL (ref 8.4–10.5)
CO2: 30 mEq/L (ref 19–32)
Chloride: 96 mEq/L (ref 96–112)
Creatinine, Ser: 1.9 mg/dL — ABNORMAL HIGH (ref 0.4–1.2)
GFR: 28.2 mL/min — AB (ref >60.00)
GLUCOSE: 131 mg/dL — AB (ref 70–99)
Potassium: 4.1 mEq/L (ref 3.5–5.1)
Sodium: 136 mEq/L (ref 135–145)

## 2014-06-23 LAB — CBC WITH DIFFERENTIAL/PLATELET
BASOS ABS: 0 10*3/uL (ref 0.0–0.1)
Basophils Relative: 0 % (ref 0.0–3.0)
Eosinophils Absolute: 0 10*3/uL (ref 0.0–0.7)
Eosinophils Relative: 0.1 % (ref 0.0–5.0)
HCT: 32.9 % — ABNORMAL LOW (ref 36.0–46.0)
Hemoglobin: 11.1 g/dL — ABNORMAL LOW (ref 12.0–15.0)
LYMPHS ABS: 1 10*3/uL (ref 0.7–4.0)
Lymphocytes Relative: 6.4 % — ABNORMAL LOW (ref 12.0–46.0)
MCHC: 33.9 g/dL (ref 30.0–36.0)
MCV: 91 fl (ref 78.0–100.0)
MONO ABS: 0.4 10*3/uL (ref 0.1–1.0)
Monocytes Relative: 2.6 % — ABNORMAL LOW (ref 3.0–12.0)
Neutro Abs: 14.3 10*3/uL — ABNORMAL HIGH (ref 1.4–7.7)
Neutrophils Relative %: 90.9 % — ABNORMAL HIGH (ref 43.0–77.0)
Platelets: 233 10*3/uL (ref 150.0–400.0)
RBC: 3.62 Mil/uL — ABNORMAL LOW (ref 3.87–5.11)
RDW: 21.3 % — AB (ref 11.5–15.5)
WBC: 15.8 10*3/uL — ABNORMAL HIGH (ref 4.0–10.5)

## 2014-06-23 NOTE — Progress Notes (Signed)
   Subjective:    Patient ID: Susan Pittman, female    DOB: Mar 29, 1941, 73 y.o.   MRN: [id number]  [facility] f/u- pt was again admitted on 8/10-8/12 for weakness.  Prior to admission, pt's lasix was decreased due to elevated Cr and hold fluid intake steady at 1 L.  Pt reports on 8/17-18 was 'very weak'.  Pt reports yesterday and today feeling 'jittery'.  Pt's weight since d/c has been stable 221-222.  CBGs have been 122-153 fasting, 156-193 lunch, 164-270 dinner.  Home BPs ranging 138-170/80-99.  Pt is to take BP at 10am and if BP >120 is to take 50mg  Hydralazine.  Taking Bystolic 20mg  nightly.  On Lasix 40mg  BID.  No CP.  Intermittent SOB- will occasionally not wear bra b/c she feels chest tightness.  Swelling is stable and 'getting better'.  Pt reports difficulty w/ PT on Monday- 'i just didn't have the energy'.  Therapist recommended home PT b/c it was 'wearing me out'.  Pt fears the idea of home health b/c 'i don't want to give up that independence.  i feel like i'm going backwards'.   Review of Systems For ROS see HPI     Objective:   Physical Exam  Vitals reviewed. Constitutional: She is oriented to person, place, and time. She appears well-developed and well-nourished. No distress.  HENT:  Head: Normocephalic and atraumatic.  Cushingoid features  Cardiovascular: Normal rate, regular rhythm, normal heart sounds and intact distal pulses.   Pulmonary/Chest: Effort normal and breath sounds normal. No respiratory distress. She has no wheezes. She has no rales.  Musculoskeletal: She exhibits edema (1-2+ pitting edema of BLEs).  Neurological: She is alert and oriented to person, place, and time. No cranial nerve deficit.  Ambulating w/ rolling walker due to weakness  Skin: Skin is warm and dry. No erythema.  Psychiatric: She has a normal mood and affect. Her behavior is normal.          Assessment & Plan:

## 2014-06-23 NOTE — Progress Notes (Signed)
Pre visit review using our clinic review tool, if applicable. No additional management support is needed unless otherwise documented below in the visit note. 

## 2014-06-23 NOTE — Patient Instructions (Signed)
Follow up in 4-6 weeks to recheck PT progress Consider decreasing PT to twice weekly until your stamina improves Use the wheelchairs as needed Call and schedule an appt with Cardiology in the next 2(ish) weeks to recheck BP No med changes at this time Try and return some 'normalcy' to things- DATE NIGHT!! We'll notify you of your lab results and make any changes if needed Call with any questions or concerns Hang in there!!!

## 2014-06-24 ENCOUNTER — Ambulatory Visit: Payer: Medicare Other | Admitting: Physical Therapy

## 2014-06-24 DIAGNOSIS — I701 Atherosclerosis of renal artery: Secondary | ICD-10-CM | POA: Diagnosis not present

## 2014-06-24 DIAGNOSIS — Z8679 Personal history of other diseases of the circulatory system: Secondary | ICD-10-CM | POA: Diagnosis not present

## 2014-06-24 DIAGNOSIS — N059 Unspecified nephritic syndrome with unspecified morphologic changes: Secondary | ICD-10-CM | POA: Diagnosis not present

## 2014-06-24 DIAGNOSIS — I129 Hypertensive chronic kidney disease with stage 1 through stage 4 chronic kidney disease, or unspecified chronic kidney disease: Secondary | ICD-10-CM | POA: Diagnosis not present

## 2014-06-25 NOTE — Assessment & Plan Note (Signed)
New.  Pt was admitted to hospital twice in last month for physical weakness.  Discussed whether outpt PT was too much for pt.  She is not ready to be homebound or give up that sense of independence.  Stressed that she should take advantage of available resources and if walking from parking lot to PT was too much, she should use a wheelchair and save her strength for the actual exercises.  Discussed decreasing PT to twice weekly until she is able to build her stamina.  Will follow.

## 2014-06-25 NOTE — Assessment & Plan Note (Signed)
Pt had to have Lasix adjusted due to increased Cr.  Encouraged her to schedule f/u w/ cards as BP remains labile.  Pt is charting weights daily and knows to call if weight increases by more than 3 lbs.

## 2014-06-25 NOTE — Assessment & Plan Note (Signed)
Pt has appt w/ Dr Lorrene Reid tomorrow.  Pt is hoping they will be able to further wean steroids as she continues to have easy bruising, elevated CBGs, and cushingoid features.  Will follow.

## 2014-06-27 ENCOUNTER — Ambulatory Visit: Payer: Medicare Other | Admitting: Physical Therapy

## 2014-06-27 DIAGNOSIS — I509 Heart failure, unspecified: Secondary | ICD-10-CM | POA: Diagnosis not present

## 2014-06-27 DIAGNOSIS — IMO0001 Reserved for inherently not codable concepts without codable children: Secondary | ICD-10-CM | POA: Diagnosis not present

## 2014-06-27 DIAGNOSIS — I10 Essential (primary) hypertension: Secondary | ICD-10-CM | POA: Diagnosis not present

## 2014-06-27 DIAGNOSIS — M6281 Muscle weakness (generalized): Secondary | ICD-10-CM | POA: Diagnosis not present

## 2014-06-27 DIAGNOSIS — R262 Difficulty in walking, not elsewhere classified: Secondary | ICD-10-CM | POA: Diagnosis not present

## 2014-06-27 DIAGNOSIS — M25559 Pain in unspecified hip: Secondary | ICD-10-CM | POA: Diagnosis not present

## 2014-06-28 ENCOUNTER — Other Ambulatory Visit (INDEPENDENT_AMBULATORY_CARE_PROVIDER_SITE_OTHER): Payer: Medicare Other

## 2014-06-28 DIAGNOSIS — D72829 Elevated white blood cell count, unspecified: Secondary | ICD-10-CM

## 2014-06-28 LAB — CBC WITH DIFFERENTIAL/PLATELET
BASOS PCT: 0 % (ref 0.0–3.0)
Basophils Absolute: 0 10*3/uL (ref 0.0–0.1)
Eosinophils Absolute: 0 10*3/uL (ref 0.0–0.7)
Eosinophils Relative: 0.5 % (ref 0.0–5.0)
HEMATOCRIT: 31.5 % — AB (ref 36.0–46.0)
HEMOGLOBIN: 10.4 g/dL — AB (ref 12.0–15.0)
LYMPHS ABS: 1.8 10*3/uL (ref 0.7–4.0)
Lymphocytes Relative: 20.7 % (ref 12.0–46.0)
MCHC: 32.9 g/dL (ref 30.0–36.0)
MCV: 92.7 fl (ref 78.0–100.0)
MONOS PCT: 5 % (ref 3.0–12.0)
Monocytes Absolute: 0.4 10*3/uL (ref 0.1–1.0)
NEUTROS ABS: 6.4 10*3/uL (ref 1.4–7.7)
Neutrophils Relative %: 73.8 % (ref 43.0–77.0)
Platelets: 221 10*3/uL (ref 150.0–400.0)
RBC: 3.39 Mil/uL — ABNORMAL LOW (ref 3.87–5.11)
RDW: 20.9 % — AB (ref 11.5–15.5)
WBC: 8.6 10*3/uL (ref 4.0–10.5)

## 2014-06-29 ENCOUNTER — Ambulatory Visit: Payer: Medicare Other | Admitting: Physical Therapy

## 2014-06-30 ENCOUNTER — Ambulatory Visit: Payer: Medicare Other | Admitting: Rehabilitation

## 2014-06-30 DIAGNOSIS — M25559 Pain in unspecified hip: Secondary | ICD-10-CM | POA: Diagnosis not present

## 2014-06-30 DIAGNOSIS — M6281 Muscle weakness (generalized): Secondary | ICD-10-CM | POA: Diagnosis not present

## 2014-06-30 DIAGNOSIS — I509 Heart failure, unspecified: Secondary | ICD-10-CM | POA: Diagnosis not present

## 2014-06-30 DIAGNOSIS — I10 Essential (primary) hypertension: Secondary | ICD-10-CM | POA: Diagnosis not present

## 2014-06-30 DIAGNOSIS — R262 Difficulty in walking, not elsewhere classified: Secondary | ICD-10-CM | POA: Diagnosis not present

## 2014-06-30 DIAGNOSIS — IMO0001 Reserved for inherently not codable concepts without codable children: Secondary | ICD-10-CM | POA: Diagnosis not present

## 2014-07-01 ENCOUNTER — Ambulatory Visit (INDEPENDENT_AMBULATORY_CARE_PROVIDER_SITE_OTHER): Payer: Medicare Other | Admitting: Endocrinology

## 2014-07-01 ENCOUNTER — Ambulatory Visit: Payer: Medicare Other | Admitting: Rehabilitation

## 2014-07-01 ENCOUNTER — Encounter: Payer: Self-pay | Admitting: Endocrinology

## 2014-07-01 VITALS — BP 122/58 | HR 63 | Temp 98.2°F

## 2014-07-01 DIAGNOSIS — IMO0001 Reserved for inherently not codable concepts without codable children: Secondary | ICD-10-CM

## 2014-07-01 DIAGNOSIS — I2510 Atherosclerotic heart disease of native coronary artery without angina pectoris: Secondary | ICD-10-CM | POA: Diagnosis not present

## 2014-07-01 DIAGNOSIS — E1165 Type 2 diabetes mellitus with hyperglycemia: Principal | ICD-10-CM

## 2014-07-01 MED ORDER — REPAGLINIDE 0.5 MG PO TABS: 0.5000 mg | ORAL_TABLET | Freq: Three times a day (TID) | ORAL | Status: DC

## 2014-07-01 NOTE — Patient Instructions (Addendum)
check your blood sugar once a day.  vary the time of day when you check, between before the 3 meals, and at bedtime.  also check if you have symptoms of your blood sugar being too high or too low.  please keep a record of the readings and bring it to your next appointment here.  You can write it on any piece of paper.  please call us sooner if your blood sugar goes below 70, or if you have a lot of readings over 200.   Please come back for a follow-up appointment in 3 months. i have sent a prescription to your pharmacy, for the diabetes. Call if your blood sugar stays high, as we cna increase thes if necessary.

## 2014-07-01 NOTE — Progress Notes (Signed)
Subjective:    Patient ID: Susan Pittman, female    DOB: 1941-06-18, 73 y.o.   MRN: [id number]  HPI Pt returns for f/u of type 2 DM (dx'ed in early 2015, after she was started on prednisone for IgA nephropathy; no chronic complications).  The prednisone has been tapered over the past few weeks.  It is now down to 20 mg daily.  she brings a record of her cbg's which i have reviewed today.  It varies from 101-272.  It is in general higher as the day goes on.  She has few mos of moderate swelling of the legs, but no assoc sob. Past Medical History  Diagnosis Date  . Asthma   . Hypertension   . GERD (gastroesophageal reflux disease)   . Allergy   . Hypothyroid   . Osteoporosis   . Gastric ulcer   . Hiatal hernia   . Hyperlipidemia     "don't take RX for it" (11/08/2013)  . Renal artery stenosis     with stent placement  . Lung mass     superior segment LLL/notes 11/08/2013  . Pneumonia     "I've had it ?3 times" (11/08/2013)  . Arthritis     "knees, hips, back, hands" (11/08/2013)  . DJD (degenerative joint disease)   . Lung nodule/ adenoca > LLobectomy 12/15/13  11/08/2013    PET 11/23/2013 1. Dominant sub solid left lower lobe nodule does not demonstrate significantly increased metabolic activity. However, morphologically, adenocarcinoma is still a concern.   - Spirometry 11/24/13 wnl  - 11/24/2013 T surg eval rec> LLower lobectomy 12/15/2013 for adenoca   . CAD (coronary artery disease), non obstructive by cardiac cath 12/12/13 01/24/2014  . Basal cell carcinoma     "cut them off face & right shoulder" (11/08/2013)  . Diastolic CHF   . CKD (chronic kidney disease), stage III   . Nephropathy     Past Surgical History  Procedure Laterality Date  . Knee arthroscopy Bilateral   . Anterior cervical decomp/discectomy fusion  2000    C5-C6  . Renal artery stent Right 11/08/2013    Archie Endo 11/08/2013  . Tonsillectomy and adenoidectomy  ?1946  . Cholecystectomy  1970's  . Vaginal hysterectomy  1970  .  Dilation and curettage of uterus  1960's  . Skin cancer excision    . Coronary angiogram      Hx: of 2/ 2015  . Cardiac catheterization      Hx: of 2/ 2015  . Video bronchoscopy N/A 12/15/2013    Procedure: VIDEO BRONCHOSCOPY;  Surgeon: Grace Isaac, MD;  Location: [facility] OR;  Service: Thoracic;  Laterality: N/A;  . Video assisted thoracoscopy (vats)/wedge resection Left 12/15/2013    Procedure: VIDEO ASSISTED THORACOSCOPY (VATS)/WEDGE RESECTION;  Surgeon: Grace Isaac, MD;  Location: [place];  Service: Thoracic;  Laterality: Left;  . Lobectomy Left 12/15/2013    Procedure: LOBECTOMY;  Surgeon: Grace Isaac, MD;  Location: [place];  Service: Thoracic;  Laterality: Left;    History   Social History  . Marital Status: Married    Spouse Name: N/A    Number of Children: N/A  . Years of Education: N/A   Occupational History  . Retired    Social History Main Topics  . Smoking status: Never Smoker   . Smokeless tobacco: Never Used  . Alcohol Use: No  . Drug Use: No  . Sexual Activity: Yes   Other Topics Concern  . Not on  file   Social History Narrative  . No narrative on file    Current Outpatient Prescriptions on File Prior to Visit  Medication Sig Dispense Refill  . albuterol (PROVENTIL HFA;VENTOLIN HFA) 108 (90 BASE) MCG/ACT inhaler Inhale 1-2 puffs into the lungs every 6 (six) hours as needed for wheezing or shortness of breath.  18 g  1  . ALPRAZolam (XANAX) 0.5 MG tablet Take 0.5 mg by mouth at bedtime.       . Blood Glucose Monitoring Suppl (ONE TOUCH ULTRA 2) W/DEVICE KIT 1 Device by Does not apply route once.  1 each  0  . Calcium Carb-Cholecalciferol 600-800 MG-UNIT TABS Take 1 tablet by mouth daily.      . cetirizine (ZYRTEC) 10 MG tablet Take 10 mg by mouth at bedtime.       . DULERA 100-5 MCG/ACT AERO Inhale 2 puffs into the lungs 2 (two) times daily as needed for shortness of breath.       . famotidine (PEPCID) 20 MG tablet Take 20 mg by mouth 2 (two) times  daily.      . furosemide (LASIX) 40 MG tablet Take 1 tablet (40 mg total) by mouth 2 (two) times daily.  60 tablet  0  . glucose blood (ONE TOUCH ULTRA TEST) test strip Pt to test 3-4x daily due to labile sugars Dx. 250.02  100 each  12  . hydrALAZINE (APRESOLINE) 50 MG tablet Take 50 mg by mouth daily.      . Lancets (ONETOUCH ULTRASOFT) lancets Use 1 per day dx code 249.00  100 each  12  . levothyroxine (SYNTHROID, LEVOTHROID) 88 MCG tablet Take 88 mcg by mouth daily before breakfast.      . nebivolol (BYSTOLIC) 10 MG tablet Take 20 mg by mouth daily.      [name] DELICA LANCETS 81E MISC Use to check blood sugar once daily as instructed. Dx code: 790.29  100 each  2  . pantoprazole (PROTONIX) 40 MG tablet Take 1 tablet (40 mg total) by mouth daily. Take 30-60 min before first meal of the day  30 tablet  2  . polyethylene glycol powder (GLYCOLAX/MIRALAX) powder Take 17 g by mouth daily.  850 g  1  . predniSONE (DELTASONE) 50 MG tablet Take 20 mg by mouth daily with breakfast.        No current facility-administered medications on file prior to visit.    Allergies  Allergen Reactions  . Benazepril Hcl Anaphylaxis and Cough  . Loratadine Anaphylaxis    anaphylaxis  . Celecoxib     REACTION: aching  . Lisinopril     REACTION: cough  . Allegra [Fexofenadine Hcl]     Severe back pain  . Sulfa Antibiotics Hives    Family History  Problem Relation Age of Onset  . Breast cancer Sister   . Emphysema Father     smoked  . Allergies Sister   . Allergies Brother   . Heart disease Mother   . Heart disease Father   . Diabetes Father   . Diabetes Brother     BP 122/58  Pulse 63  Temp(Src) 98.2 F (36.8 C) (Oral)  SpO2 97%    Review of Systems She denies hypoglycemia.  She has lost weight.      Objective:   Physical Exam VITAL SIGNS:  See vs page GENERAL: no distress.  In wheelchair Pulses: dorsalis pedis intact bilat.   Feet: no deformity. normal color and temp.  1+  bilat leg edema Skin:  no ulcer on the feet.   Neuro: sensation is intact to touch on the feet, but decreased from normal  Lab Results  Component Value Date   HGBA1C 7.4* 05/27/2014       Assessment & Plan:  DM: slight exacerbation Weight loss, new.  This helps glycemic control. Edema: this limits DM rx options.  Patient is advised the following: Patient Instructions  check your blood sugar once a day.  vary the time of day when you check, between before the 3 meals, and at bedtime.  also check if you have symptoms of your blood sugar being too high or too low.  please keep a record of the readings and bring it to your next appointment here.  You can write it on any piece of paper.  please call us sooner if your blood sugar goes below 70, or if you have a lot of readings over 200.   Please come back for a follow-up appointment in 3 months. i have sent a prescription to your pharmacy, for the diabetes. Call if your blood sugar stays high, as we cna increase thes if necessary.

## 2014-07-03 DIAGNOSIS — E119 Type 2 diabetes mellitus without complications: Secondary | ICD-10-CM | POA: Insufficient documentation

## 2014-07-05 ENCOUNTER — Ambulatory Visit: Payer: Medicare Other | Attending: Internal Medicine | Admitting: Physical Therapy

## 2014-07-05 DIAGNOSIS — R262 Difficulty in walking, not elsewhere classified: Secondary | ICD-10-CM | POA: Insufficient documentation

## 2014-07-05 DIAGNOSIS — IMO0001 Reserved for inherently not codable concepts without codable children: Secondary | ICD-10-CM | POA: Insufficient documentation

## 2014-07-05 DIAGNOSIS — I10 Essential (primary) hypertension: Secondary | ICD-10-CM | POA: Insufficient documentation

## 2014-07-05 DIAGNOSIS — I509 Heart failure, unspecified: Secondary | ICD-10-CM | POA: Insufficient documentation

## 2014-07-05 DIAGNOSIS — R42 Dizziness and giddiness: Secondary | ICD-10-CM | POA: Insufficient documentation

## 2014-07-05 DIAGNOSIS — M6281 Muscle weakness (generalized): Secondary | ICD-10-CM | POA: Insufficient documentation

## 2014-07-05 DIAGNOSIS — M25559 Pain in unspecified hip: Secondary | ICD-10-CM | POA: Insufficient documentation

## 2014-07-05 DIAGNOSIS — Z902 Acquired absence of lung [part of]: Secondary | ICD-10-CM | POA: Insufficient documentation

## 2014-07-05 DIAGNOSIS — Z85118 Personal history of other malignant neoplasm of bronchus and lung: Secondary | ICD-10-CM | POA: Insufficient documentation

## 2014-07-06 ENCOUNTER — Ambulatory Visit (INDEPENDENT_AMBULATORY_CARE_PROVIDER_SITE_OTHER): Payer: Medicare Other | Admitting: Physician Assistant

## 2014-07-06 ENCOUNTER — Encounter: Payer: Self-pay | Admitting: Physician Assistant

## 2014-07-06 VITALS — BP 110/70 | HR 66 | Ht 65.0 in | Wt 227.0 lb

## 2014-07-06 DIAGNOSIS — N183 Chronic kidney disease, stage 3 unspecified: Secondary | ICD-10-CM

## 2014-07-06 DIAGNOSIS — R7989 Other specified abnormal findings of blood chemistry: Secondary | ICD-10-CM | POA: Diagnosis not present

## 2014-07-06 DIAGNOSIS — I701 Atherosclerosis of renal artery: Secondary | ICD-10-CM

## 2014-07-06 DIAGNOSIS — I10 Essential (primary) hypertension: Secondary | ICD-10-CM | POA: Diagnosis not present

## 2014-07-06 DIAGNOSIS — I5032 Chronic diastolic (congestive) heart failure: Secondary | ICD-10-CM

## 2014-07-06 DIAGNOSIS — R778 Other specified abnormalities of plasma proteins: Secondary | ICD-10-CM

## 2014-07-06 DIAGNOSIS — I2510 Atherosclerotic heart disease of native coronary artery without angina pectoris: Secondary | ICD-10-CM

## 2014-07-06 NOTE — Progress Notes (Signed)
Cardiology Office Note    Date:  07/06/2014   ID:  Susan Pittman, DOB [birth year], MRN [id number]  PCP:  Susan Asa, MD  Cardiologist:  Dr. Kathlyn Pittman      History of Present Illness: Susan Pittman is a 73 y.o. female with a hx of HTN, RAS s/p R RA stenting (Dr. Gwenlyn Pittman) in 11/2013, non-obstructive CAD by cath in 12/2013, hypothyroidism, remote PUD, GERD, lung CA s/p VATS/LLL lobectomy, CKD 2/2 IgA nephropathy tx with high dose prednisone.  Admitted in 05/2014 with symptomatic hypotension.  BNP and troponins were elevated and she was seen by cardiology.  Echo demonstrated normal EF and no WMA.  Elevated Tn was felt to likely be related to volume overload in setting of CKD.  No further workup was recommended.  VQ scan was low prob.  She was diuresed and was followed by nephrology.  Readmitted 8/10-8/12 with symptomatic hypotension.  Medications were adjusted further.  She returns for FU.  She is doing well since discharge from the hospital. Breathing is improved. She denies chest pain or syncope. She denies coughing or wheezing. She denies orthopnea, PND. Of note, since her Lasix was reduced during her previous admission, she has had increasing LE edema.    Studies:  - LHC (2/15):  Mid RCA 20%, EF 65%, R RA stent ok, L RA without sig dsz  - Echo (7/15):  Mild LVH, EF 55-60%, no RWMA, Gr 1 DD, mild LAE, small eff  - RA duplex (7/15):  Patent R RA stent  Recent Labs/Images: 03/18/2014: HDL Cholesterol by NMR 67.40; LDL (calc) 133*  06/13/2014: Pro B Natriuretic peptide (BNP) 7258.0*  06/14/2014: TSH 0.374  06/23/2014: ALT 27; Creatinine 1.9*; Potassium 4.1  06/28/2014: Hemoglobin 10.4*   Dg Chest 2 View  06/13/2014   IMPRESSION: Loculated effusion left base, stable. No edema or consolidation. No new opacity compared to most recent prior study. Heart prominent but stable.   Electronically Signed   By: Susan Pittman M.D.   On: 06/13/2014 10:08   Ct Chest Wo Contrast  06/13/2014    IMPRESSION: No acute abnormality. Slowly resolving pleural thickening and loculated fluid at the left lung base.  Stable 2 mm nodule in the right upper lobe.   Electronically Signed   By: Susan Pittman M.D.   On: 06/13/2014 12:25     Wt Readings from Last 3 Encounters:  07/06/14 227 lb (102.967 kg)  06/23/14 226 lb 6 oz (102.683 kg)  06/13/14 202 lb 12.8 oz (91.989 kg)     Past Medical History  Diagnosis Date  . Asthma   . Hypertension   . GERD (gastroesophageal reflux disease)   . Allergy   . Hypothyroid   . Osteoporosis   . Gastric ulcer   . Hiatal hernia   . Hyperlipidemia     "don't take RX for it" (11/08/2013)  . Renal artery stenosis     with stent placement  . Lung mass     superior segment LLL/notes 11/08/2013  . Pneumonia     "I've had it ?3 times" (11/08/2013)  . Arthritis     "knees, hips, back, hands" (11/08/2013)  . DJD (degenerative joint disease)   . Lung nodule/ adenoca > LLobectomy 12/15/13  11/08/2013    PET 11/23/2013 1. Dominant sub solid left lower lobe nodule does not demonstrate significantly increased metabolic activity. However, morphologically, adenocarcinoma is still a concern.   - Spirometry 11/24/13 wnl  - 11/24/2013 T surg eval rec>  LLower lobectomy 12/15/2013 for adenoca   . CAD (coronary artery disease), non obstructive by cardiac cath 12/12/13 01/24/2014  . Basal cell carcinoma     "cut them off face & right shoulder" (11/08/2013)  . Diastolic CHF   . CKD (chronic kidney disease), stage III   . Nephropathy     Current Outpatient Prescriptions  Medication Sig Dispense Refill  . albuterol (PROVENTIL HFA;VENTOLIN HFA) 108 (90 BASE) MCG/ACT inhaler Inhale 1-2 puffs into the lungs every 6 (six) hours as needed for wheezing or shortness of breath.  18 g  1  . ALPRAZolam (XANAX) 0.5 MG tablet Take 0.5 mg by mouth at bedtime.       . Blood Glucose Monitoring Suppl (ONE TOUCH ULTRA 2) W/DEVICE KIT 1 Device by Does not apply route once.  1 each  0  . Calcium  Carb-Cholecalciferol 600-800 MG-UNIT TABS Take 1 tablet by mouth daily.      . cetirizine (ZYRTEC) 10 MG tablet Take 10 mg by mouth at bedtime.       . DULERA 100-5 MCG/ACT AERO Inhale 2 puffs into the lungs 2 (two) times daily as needed for shortness of breath.       . famotidine (PEPCID) 20 MG tablet Take 20 mg by mouth 2 (two) times daily.      . furosemide (LASIX) 40 MG tablet Take 1 tablet (40 mg total) by mouth 2 (two) times daily.  60 tablet  0  . glucose blood (ONE TOUCH ULTRA TEST) test strip Pt to test 3-4x daily due to labile sugars Dx. 250.02  100 each  12  . hydrALAZINE (APRESOLINE) 50 MG tablet Take 50 mg by mouth daily.      . Lancets (ONETOUCH ULTRASOFT) lancets Use 1 per day dx code 249.00  100 each  12  . levothyroxine (SYNTHROID, LEVOTHROID) 88 MCG tablet Take 88 mcg by mouth daily before breakfast.      . nebivolol (BYSTOLIC) 10 MG tablet Take 20 mg by mouth daily.      Susan Pittman DELICA LANCETS 09K MISC Use to check blood sugar once daily as instructed. Dx code: 790.29  100 each  2  . pantoprazole (PROTONIX) 40 MG tablet Take 1 tablet (40 mg total) by mouth daily. Take 30-60 min before first meal of the day  30 tablet  2  . polyethylene glycol powder (GLYCOLAX/MIRALAX) powder Take 17 g by mouth daily.  850 g  1  . predniSONE (DELTASONE) 50 MG tablet Take 20 mg by mouth daily with breakfast.       . repaglinide (PRANDIN) 0.5 MG tablet Take 1 tablet (0.5 mg total) by mouth 3 (three) times daily before meals.  90 tablet  11   No current facility-administered medications for this visit.     Allergies:   Benazepril hcl; Loratadine; Celecoxib; Lisinopril; Allegra; and Sulfa antibiotics   Social History:  The patient  reports that she has never smoked. She has never used smokeless tobacco. She reports that she does not drink alcohol or use illicit drugs.   Family History:  The patient's family history includes Allergies in her brother and sister; Breast cancer in her sister;  Diabetes in her brother and father; Emphysema in her father; Heart disease in her father and mother; Stroke in her father. There is no history of Heart failure.   ROS:  Please see the history of present illness.   She sometimes feels lightheaded after taking her Prandin (prior to eating)  All other systems reviewed and negative.   PHYSICAL EXAM: VS:  BP 110/70  Pulse 66  Ht _0  (1.651 m)  Wt 227 lb (102.967 kg)  BMI 37.77 kg/m2 Well nourished, well developed, in no acute distress HEENT: normal Neck: no JVDat 90 Cardiac:  normal S1, S2; RR; no murmur Lungs:  clear to auscultation bilaterally, no wheezing, rhonchi or rales Abd: soft, nontender, no hepatomegaly Ext: 1-2+ bilateral pedal/ankle edema Skin: warm and dry Neuro:  CNs 2-12 intact, no focal abnormalities noted  EKG:  NSR, HR 66, normal axis     ASSESSMENT AND PLAN:  Elevated troponin:  This was likely related to volume overload in the setting of chronic kidney disease and hypotension. Echocardiogram demonstrated normal LV function without wall motion abnormality. Prior cardiac catheterization demonstrated just minimal coronary plaque.  She does not require further cardiac workup at this time. Unspecified essential hypertension: Blood pressure has improved since she cut back on her prednisone recently. She will continue to monitor this. She takes hydralazine once daily (in the morning) as needed. I have asked her to continue to monitor her blood pressure in the evening and take hydralazine in the evening if her blood pressure is above 973 systolic. Coronary artery disease: Minimal coronary plaque on recent cardiac catheterization. She is not having any symptoms of angina. Chronic diastolic heart failure: LE edema has increased since her Lasix was reduced in the hospital. I have asked her to followup with her nephrologist to manage her diuretics before increasing her Lasix again. Renal artery atherosclerosis, s/p right RA stent  11/08/13: Recent duplex with patent stents. Followup with Dr. Fletcher Anon as planned. CKD (chronic kidney disease) stage 3, GFR 30-59 ml/min: FU with nephrology as planned.   Disposition:  FU with Dr. Kathlyn Pittman in 3 mos.   Signed, Versie Starks, MHS 07/06/2014 12:26 PM    [facility] [place] [address], [place], [place]  [address] Phone: [phone]; Fax: [phone]

## 2014-07-06 NOTE — Patient Instructions (Signed)
CHECK BLOOD PRESSURE AT 6 PM DAILY AS WELL AND SYSTOLIC BP IS ABOVE 375 THEN TAKE ANOTHER HYDRALAZINE  CALL DR. Lorrene Reid OFFICE ABOUT THE SWELLING  Your physician recommends that you schedule a follow-up appointment in: [place] DR. Fletcher Anon

## 2014-07-08 ENCOUNTER — Ambulatory Visit: Payer: Medicare Other | Admitting: Rehabilitation

## 2014-07-08 DIAGNOSIS — Z85118 Personal history of other malignant neoplasm of bronchus and lung: Secondary | ICD-10-CM | POA: Diagnosis not present

## 2014-07-08 DIAGNOSIS — R262 Difficulty in walking, not elsewhere classified: Secondary | ICD-10-CM | POA: Diagnosis not present

## 2014-07-08 DIAGNOSIS — IMO0001 Reserved for inherently not codable concepts without codable children: Secondary | ICD-10-CM | POA: Diagnosis not present

## 2014-07-08 DIAGNOSIS — R42 Dizziness and giddiness: Secondary | ICD-10-CM | POA: Diagnosis not present

## 2014-07-08 DIAGNOSIS — I509 Heart failure, unspecified: Secondary | ICD-10-CM | POA: Diagnosis not present

## 2014-07-08 DIAGNOSIS — M25559 Pain in unspecified hip: Secondary | ICD-10-CM | POA: Diagnosis not present

## 2014-07-08 DIAGNOSIS — Z902 Acquired absence of lung [part of]: Secondary | ICD-10-CM | POA: Diagnosis not present

## 2014-07-08 DIAGNOSIS — I10 Essential (primary) hypertension: Secondary | ICD-10-CM | POA: Diagnosis not present

## 2014-07-08 DIAGNOSIS — M6281 Muscle weakness (generalized): Secondary | ICD-10-CM | POA: Diagnosis not present

## 2014-07-12 ENCOUNTER — Ambulatory Visit: Payer: Medicare Other | Admitting: Physical Therapy

## 2014-07-12 DIAGNOSIS — R42 Dizziness and giddiness: Secondary | ICD-10-CM | POA: Diagnosis not present

## 2014-07-12 DIAGNOSIS — I10 Essential (primary) hypertension: Secondary | ICD-10-CM | POA: Diagnosis not present

## 2014-07-12 DIAGNOSIS — M6281 Muscle weakness (generalized): Secondary | ICD-10-CM | POA: Diagnosis not present

## 2014-07-12 DIAGNOSIS — R262 Difficulty in walking, not elsewhere classified: Secondary | ICD-10-CM | POA: Diagnosis not present

## 2014-07-12 DIAGNOSIS — I509 Heart failure, unspecified: Secondary | ICD-10-CM | POA: Diagnosis not present

## 2014-07-12 DIAGNOSIS — IMO0001 Reserved for inherently not codable concepts without codable children: Secondary | ICD-10-CM | POA: Diagnosis not present

## 2014-07-13 ENCOUNTER — Telehealth: Payer: Self-pay | Admitting: Family Medicine

## 2014-07-13 NOTE — Telephone Encounter (Signed)
Caller name: Kaisha Relation to pt: self Call back number: [phone] Pharmacy:  Reason for call:  Patient states that while shopping she cut her leg. Patient states that the cut is deep. Patient hung up the phone while I was on hold with CAN

## 2014-07-14 DIAGNOSIS — R5381 Other malaise: Secondary | ICD-10-CM | POA: Diagnosis not present

## 2014-07-14 DIAGNOSIS — N184 Chronic kidney disease, stage 4 (severe): Secondary | ICD-10-CM | POA: Diagnosis not present

## 2014-07-14 DIAGNOSIS — I5032 Chronic diastolic (congestive) heart failure: Secondary | ICD-10-CM | POA: Diagnosis not present

## 2014-07-15 ENCOUNTER — Ambulatory Visit: Payer: Medicare Other | Admitting: Rehabilitation

## 2014-07-20 ENCOUNTER — Ambulatory Visit: Payer: Medicare Other | Admitting: Rehabilitation

## 2014-07-20 DIAGNOSIS — IMO0001 Reserved for inherently not codable concepts without codable children: Secondary | ICD-10-CM | POA: Diagnosis not present

## 2014-07-20 DIAGNOSIS — I509 Heart failure, unspecified: Secondary | ICD-10-CM | POA: Diagnosis not present

## 2014-07-20 DIAGNOSIS — R42 Dizziness and giddiness: Secondary | ICD-10-CM | POA: Diagnosis not present

## 2014-07-20 DIAGNOSIS — M6281 Muscle weakness (generalized): Secondary | ICD-10-CM | POA: Diagnosis not present

## 2014-07-20 DIAGNOSIS — I10 Essential (primary) hypertension: Secondary | ICD-10-CM | POA: Diagnosis not present

## 2014-07-20 DIAGNOSIS — R262 Difficulty in walking, not elsewhere classified: Secondary | ICD-10-CM | POA: Diagnosis not present

## 2014-07-21 ENCOUNTER — Ambulatory Visit (INDEPENDENT_AMBULATORY_CARE_PROVIDER_SITE_OTHER): Payer: Medicare Other | Admitting: Family Medicine

## 2014-07-21 ENCOUNTER — Encounter: Payer: Self-pay | Admitting: Family Medicine

## 2014-07-21 VITALS — BP 118/78 | HR 75 | Temp 98.0°F | Resp 16 | Wt 225.1 lb

## 2014-07-21 DIAGNOSIS — I5032 Chronic diastolic (congestive) heart failure: Secondary | ICD-10-CM

## 2014-07-21 DIAGNOSIS — I10 Essential (primary) hypertension: Secondary | ICD-10-CM | POA: Diagnosis not present

## 2014-07-21 DIAGNOSIS — R0602 Shortness of breath: Secondary | ICD-10-CM

## 2014-07-21 DIAGNOSIS — I2510 Atherosclerotic heart disease of native coronary artery without angina pectoris: Secondary | ICD-10-CM

## 2014-07-21 NOTE — Progress Notes (Signed)
Pre visit review using our clinic review tool, if applicable. No additional management support is needed unless otherwise documented below in the visit note. 

## 2014-07-21 NOTE — Progress Notes (Signed)
   Subjective:    Patient ID: [name], female    DOB: 26-Feb-1941, 73 y.o.   MRN: [id number]  HPI Deconditioning- pt reports increased strength.  HTN- chronic problem.  BP is excellent today in the office.  Pt is to take Hydralazine if AM BP is >120 and if PM BP is >151.  Has only required 1 hydralazine in the evening.    CHF- goal weight is 219.  Pt taking Lasix 80mg  qAM and 40mg  qPM.  Pt reports this current regimen is controlling her swelling.  SOB- pt reports intermittently she will have 'band like tightness' across chest.  Currently has this and also has associated wheezing.  Pt reports sleeping poorly last night.  Increased fatigue this week b/c she went to visit granddaughters.   Review of Systems For ROS see HPI     Objective:   Physical Exam  Vitals reviewed. Constitutional: She is oriented to person, place, and time. She appears well-developed and well-nourished. No distress.  HENT:  Head: Normocephalic and atraumatic.  Face is less swollen today  Cardiovascular: Normal rate, regular rhythm, normal heart sounds and intact distal pulses.   Pulmonary/Chest: Effort normal and breath sounds normal. No respiratory distress. She has no wheezes (lungs are clear but pt seems to have upper airway wheeze when speaking). She has no rales.  Musculoskeletal: She exhibits edema (1+ LE edema bilaterally).  Neurological: She is alert and oriented to person, place, and time.  Skin: Skin is warm and dry. No erythema.  Psychiatric: She has a normal mood and affect. Her behavior is normal. Thought content normal.  Pt brighter today, less down          Assessment & Plan:

## 2014-07-21 NOTE — Patient Instructions (Signed)
Follow up in 2 months to recheck PT progress No med changes at this time If the shortness of breath doesn't improve or worsens- please call me! Call with any questions or concerns Keep up the good work!! Happy Fall!

## 2014-07-22 ENCOUNTER — Ambulatory Visit: Payer: Medicare Other | Admitting: Rehabilitation

## 2014-07-22 DIAGNOSIS — R42 Dizziness and giddiness: Secondary | ICD-10-CM | POA: Diagnosis not present

## 2014-07-22 DIAGNOSIS — I509 Heart failure, unspecified: Secondary | ICD-10-CM | POA: Diagnosis not present

## 2014-07-22 DIAGNOSIS — I10 Essential (primary) hypertension: Secondary | ICD-10-CM | POA: Diagnosis not present

## 2014-07-22 DIAGNOSIS — M6281 Muscle weakness (generalized): Secondary | ICD-10-CM | POA: Diagnosis not present

## 2014-07-22 DIAGNOSIS — IMO0001 Reserved for inherently not codable concepts without codable children: Secondary | ICD-10-CM | POA: Diagnosis not present

## 2014-07-22 DIAGNOSIS — R262 Difficulty in walking, not elsewhere classified: Secondary | ICD-10-CM | POA: Diagnosis not present

## 2014-07-24 NOTE — Assessment & Plan Note (Signed)
Pt has hx of similar.  Suspect this is due to her severe physical deconditioning- which is improving w/ ongoing PT.  Pt's lungs are CTA- no obvious fluid overload.  Pt to continue her PT and increase her activity level as able.

## 2014-07-24 NOTE — Assessment & Plan Note (Signed)
Ongoing issue for pt.  Is on Lasix daily and has fluid goals.  Pt denies CP.  Ongoing SOB due to deconditioning.  Following w/ Cards.  Will continue to follow closely w/ pt.

## 2014-07-24 NOTE — Assessment & Plan Note (Signed)
Chronic problem.  Pt following w/ Cards.  Has strict instructions on when to take Hydralazine in setting of elevated BPs.  Pt has not had to take hydralazine w/ exception of once.  Currently asymptomatic.  Will follow.

## 2014-07-25 ENCOUNTER — Ambulatory Visit: Payer: Medicare Other | Admitting: Rehabilitation

## 2014-07-25 DIAGNOSIS — I509 Heart failure, unspecified: Secondary | ICD-10-CM | POA: Diagnosis not present

## 2014-07-25 DIAGNOSIS — IMO0001 Reserved for inherently not codable concepts without codable children: Secondary | ICD-10-CM | POA: Diagnosis not present

## 2014-07-25 DIAGNOSIS — R262 Difficulty in walking, not elsewhere classified: Secondary | ICD-10-CM | POA: Diagnosis not present

## 2014-07-25 DIAGNOSIS — M6281 Muscle weakness (generalized): Secondary | ICD-10-CM | POA: Diagnosis not present

## 2014-07-25 DIAGNOSIS — I10 Essential (primary) hypertension: Secondary | ICD-10-CM | POA: Diagnosis not present

## 2014-07-25 DIAGNOSIS — R42 Dizziness and giddiness: Secondary | ICD-10-CM | POA: Diagnosis not present

## 2014-07-26 DIAGNOSIS — I129 Hypertensive chronic kidney disease with stage 1 through stage 4 chronic kidney disease, or unspecified chronic kidney disease: Secondary | ICD-10-CM | POA: Diagnosis not present

## 2014-07-26 DIAGNOSIS — Z8679 Personal history of other diseases of the circulatory system: Secondary | ICD-10-CM | POA: Diagnosis not present

## 2014-07-26 DIAGNOSIS — N059 Unspecified nephritic syndrome with unspecified morphologic changes: Secondary | ICD-10-CM | POA: Diagnosis not present

## 2014-07-26 DIAGNOSIS — I701 Atherosclerosis of renal artery: Secondary | ICD-10-CM | POA: Diagnosis not present

## 2014-07-28 ENCOUNTER — Ambulatory Visit: Payer: Medicare Other | Admitting: Physical Therapy

## 2014-07-28 DIAGNOSIS — M6281 Muscle weakness (generalized): Secondary | ICD-10-CM | POA: Diagnosis not present

## 2014-07-28 DIAGNOSIS — R42 Dizziness and giddiness: Secondary | ICD-10-CM | POA: Diagnosis not present

## 2014-07-28 DIAGNOSIS — R262 Difficulty in walking, not elsewhere classified: Secondary | ICD-10-CM | POA: Diagnosis not present

## 2014-07-28 DIAGNOSIS — IMO0001 Reserved for inherently not codable concepts without codable children: Secondary | ICD-10-CM | POA: Diagnosis not present

## 2014-07-28 DIAGNOSIS — I509 Heart failure, unspecified: Secondary | ICD-10-CM | POA: Diagnosis not present

## 2014-07-28 DIAGNOSIS — I10 Essential (primary) hypertension: Secondary | ICD-10-CM | POA: Diagnosis not present

## 2014-08-01 ENCOUNTER — Ambulatory Visit: Payer: Medicare Other | Admitting: Physical Therapy

## 2014-08-01 ENCOUNTER — Other Ambulatory Visit: Payer: Self-pay | Admitting: Internal Medicine

## 2014-08-01 DIAGNOSIS — E119 Type 2 diabetes mellitus without complications: Secondary | ICD-10-CM | POA: Diagnosis not present

## 2014-08-01 DIAGNOSIS — M6281 Muscle weakness (generalized): Secondary | ICD-10-CM | POA: Diagnosis not present

## 2014-08-01 DIAGNOSIS — I10 Essential (primary) hypertension: Secondary | ICD-10-CM | POA: Diagnosis not present

## 2014-08-01 DIAGNOSIS — R262 Difficulty in walking, not elsewhere classified: Secondary | ICD-10-CM | POA: Diagnosis not present

## 2014-08-01 DIAGNOSIS — R42 Dizziness and giddiness: Secondary | ICD-10-CM | POA: Diagnosis not present

## 2014-08-01 DIAGNOSIS — IMO0001 Reserved for inherently not codable concepts without codable children: Secondary | ICD-10-CM | POA: Diagnosis not present

## 2014-08-01 DIAGNOSIS — I509 Heart failure, unspecified: Secondary | ICD-10-CM | POA: Diagnosis not present

## 2014-08-04 ENCOUNTER — Ambulatory Visit: Payer: Medicare Other | Attending: Internal Medicine | Admitting: Rehabilitation

## 2014-08-04 DIAGNOSIS — I509 Heart failure, unspecified: Secondary | ICD-10-CM | POA: Insufficient documentation

## 2014-08-04 DIAGNOSIS — I10 Essential (primary) hypertension: Secondary | ICD-10-CM | POA: Insufficient documentation

## 2014-08-04 DIAGNOSIS — Z85118 Personal history of other malignant neoplasm of bronchus and lung: Secondary | ICD-10-CM | POA: Insufficient documentation

## 2014-08-04 DIAGNOSIS — Z902 Acquired absence of lung [part of]: Secondary | ICD-10-CM | POA: Insufficient documentation

## 2014-08-04 DIAGNOSIS — M6281 Muscle weakness (generalized): Secondary | ICD-10-CM | POA: Insufficient documentation

## 2014-08-04 DIAGNOSIS — R262 Difficulty in walking, not elsewhere classified: Secondary | ICD-10-CM | POA: Insufficient documentation

## 2014-08-08 ENCOUNTER — Ambulatory Visit: Payer: Medicare Other | Admitting: Rehabilitation

## 2014-08-11 ENCOUNTER — Ambulatory Visit: Payer: Medicare Other | Admitting: Physical Therapy

## 2014-08-12 DIAGNOSIS — N059 Unspecified nephritic syndrome with unspecified morphologic changes: Secondary | ICD-10-CM | POA: Diagnosis not present

## 2014-08-17 DIAGNOSIS — I701 Atherosclerosis of renal artery: Secondary | ICD-10-CM | POA: Diagnosis not present

## 2014-08-17 DIAGNOSIS — D649 Anemia, unspecified: Secondary | ICD-10-CM | POA: Diagnosis not present

## 2014-08-17 DIAGNOSIS — N028 Recurrent and persistent hematuria with other morphologic changes: Secondary | ICD-10-CM | POA: Diagnosis not present

## 2014-08-17 DIAGNOSIS — I129 Hypertensive chronic kidney disease with stage 1 through stage 4 chronic kidney disease, or unspecified chronic kidney disease: Secondary | ICD-10-CM | POA: Diagnosis not present

## 2014-08-19 ENCOUNTER — Ambulatory Visit (HOSPITAL_COMMUNITY)
Admission: RE | Admit: 2014-08-19 | Discharge: 2014-08-19 | Disposition: A | Discharge status: Home / Self Care | Payer: Medicare Other | Source: Ambulatory Visit | Attending: Diagnostic Radiology | Admitting: Diagnostic Radiology

## 2014-08-19 ENCOUNTER — Other Ambulatory Visit: Payer: Self-pay | Admitting: Internal Medicine

## 2014-08-19 ENCOUNTER — Other Ambulatory Visit (HOSPITAL_BASED_OUTPATIENT_CLINIC_OR_DEPARTMENT_OTHER): Payer: Medicare Other

## 2014-08-19 DIAGNOSIS — C3432 Malignant neoplasm of lower lobe, left bronchus or lung: Secondary | ICD-10-CM | POA: Diagnosis not present

## 2014-08-19 DIAGNOSIS — Z902 Acquired absence of lung [part of]: Secondary | ICD-10-CM | POA: Diagnosis not present

## 2014-08-19 DIAGNOSIS — C3402 Malignant neoplasm of left main bronchus: Secondary | ICD-10-CM | POA: Insufficient documentation

## 2014-08-19 DIAGNOSIS — J929 Pleural plaque without asbestos: Secondary | ICD-10-CM | POA: Insufficient documentation

## 2014-08-19 DIAGNOSIS — C3430 Malignant neoplasm of lower lobe, unspecified bronchus or lung: Secondary | ICD-10-CM

## 2014-08-19 DIAGNOSIS — R918 Other nonspecific abnormal finding of lung field: Secondary | ICD-10-CM | POA: Insufficient documentation

## 2014-08-19 LAB — CBC WITH DIFFERENTIAL/PLATELET
BASO%: 0.5 % (ref 0.0–2.0)
BASOS ABS: 0.1 10*3/uL (ref 0.0–0.1)
EOS%: 0.5 % (ref 0.0–7.0)
Eosinophils Absolute: 0.1 10*3/uL (ref 0.0–0.5)
HEMATOCRIT: 33.9 % — AB (ref 34.8–46.6)
HEMOGLOBIN: 10.8 g/dL — AB (ref 11.6–15.9)
LYMPH#: 2 10*3/uL (ref 0.9–3.3)
LYMPH%: 18.3 % (ref 14.0–49.7)
MCH: 29.7 pg (ref 25.1–34.0)
MCHC: 31.9 g/dL (ref 31.5–36.0)
MCV: 93.1 fL (ref 79.5–101.0)
MONO#: 0.8 10*3/uL (ref 0.1–0.9)
MONO%: 7 % (ref 0.0–14.0)
NEUT%: 73.7 % (ref 38.4–76.8)
NEUTROS ABS: 8.2 10*3/uL — AB (ref 1.5–6.5)
Platelets: 284 10*3/uL (ref 145–400)
RBC: 3.64 10*6/uL — ABNORMAL LOW (ref 3.70–5.45)
RDW: 15.2 % — ABNORMAL HIGH (ref 11.2–14.5)
WBC: 11.1 10*3/uL — AB (ref 3.9–10.3)

## 2014-08-19 LAB — COMPREHENSIVE METABOLIC PANEL (CC13)
ALT: 15 U/L (ref 0–55)
ANION GAP: 12 meq/L — AB (ref 3–11)
AST: 13 U/L (ref 5–34)
Albumin: 3 g/dL — ABNORMAL LOW (ref 3.5–5.0)
Alkaline Phosphatase: 49 U/L (ref 40–150)
BILIRUBIN TOTAL: 0.33 mg/dL (ref 0.20–1.20)
BUN: 61.5 mg/dL — AB (ref 7.0–26.0)
CO2: 29 meq/L (ref 22–29)
CREATININE: 2 mg/dL — AB (ref 0.6–1.1)
Calcium: 9.6 mg/dL (ref 8.4–10.4)
Chloride: 107 mEq/L (ref 98–109)
GLUCOSE: 87 mg/dL (ref 70–140)
Potassium: 3.8 mEq/L (ref 3.5–5.1)
Sodium: 147 mEq/L — ABNORMAL HIGH (ref 136–145)
Total Protein: 6 g/dL — ABNORMAL LOW (ref 6.4–8.3)

## 2014-08-22 ENCOUNTER — Ambulatory Visit (HOSPITAL_BASED_OUTPATIENT_CLINIC_OR_DEPARTMENT_OTHER): Payer: Medicare Other | Admitting: Internal Medicine

## 2014-08-22 ENCOUNTER — Telehealth: Payer: Self-pay | Admitting: Internal Medicine

## 2014-08-22 ENCOUNTER — Encounter: Payer: Self-pay | Admitting: Internal Medicine

## 2014-08-22 VITALS — BP 149/68 | HR 64 | Temp 98.3°F | Resp 19 | Ht 65.0 in | Wt 224.7 lb

## 2014-08-22 DIAGNOSIS — Z85118 Personal history of other malignant neoplasm of bronchus and lung: Secondary | ICD-10-CM

## 2014-08-22 DIAGNOSIS — C3432 Malignant neoplasm of lower lobe, left bronchus or lung: Secondary | ICD-10-CM

## 2014-08-22 NOTE — Progress Notes (Signed)
[place] [phone]   [phone]  OFFICE PROGRESS NOTE  [name], MD [place] [id number]  DIAGNOSIS: Stage IA (T1a, N0, M0) non-small cell lung cancer, adenocarcinoma diagnosed in January of 2015.  PRIOR THERAPY: Bronchoscopy, left video-assisted thoracoscopy, with wedge resection for biopsy of left lower lobe lesion, completion  left lower lobectomy, lymph node dissection under the care of Dr. Servando Snare  CURRENT THERAPY: Observation.  INTERVAL HISTORY: Susan Pittman 73 y.o. female returns to the clinic today for followup visit accompanied by her husband. The patient is feeling fine today with no specific complaints except for mild fatigue and swelling of the lower extremities. She is currently on 120 mg of Lasix on daily basis. She denied having any significant chest pain but continues to have shortness breath with exertion. She has no cough or hemoptysis. She denied having any significant nausea or vomiting. The patient had repeat CT scan of the chest performed recently and she is here for evaluation and discussion of her scan results.  MEDICAL HISTORY: Past Medical History  Diagnosis Date  . Asthma   . Hypertension   . GERD (gastroesophageal reflux disease)   . Allergy   . Hypothyroid   . Osteoporosis   . Gastric ulcer   . Hiatal hernia   . Hyperlipidemia     "don't take RX for it" (11/08/2013)  . Renal artery stenosis     with stent placement  . Lung mass     superior segment LLL/notes 11/08/2013  . Pneumonia     "I've had it ?3 times" (11/08/2013)  . Arthritis     "knees, hips, back, hands" (11/08/2013)  . DJD (degenerative joint disease)   . Lung nodule/ adenoca > LLobectomy 12/15/13  11/08/2013    PET 11/23/2013 1. Dominant sub solid left lower lobe nodule does not demonstrate significantly increased metabolic activity. However, morphologically, adenocarcinoma is still a concern.   - Spirometry  11/24/13 wnl  - 11/24/2013 T surg eval rec> LLower lobectomy 12/15/2013 for adenoca   . CAD (coronary artery disease), non obstructive by cardiac cath 12/12/13 01/24/2014  . Basal cell carcinoma     "cut them off face & right shoulder" (11/08/2013)  . Diastolic CHF   . CKD (chronic kidney disease), stage III   . Nephropathy     ALLERGIES:  is allergic to benazepril hcl; loratadine; celecoxib; lisinopril; allegra; and sulfa antibiotics.  MEDICATIONS:  Current Outpatient Prescriptions  Medication Sig Dispense Refill  . albuterol (PROVENTIL HFA;VENTOLIN HFA) 108 (90 BASE) MCG/ACT inhaler Inhale 1-2 puffs into the lungs every 6 (six) hours as needed for wheezing or shortness of breath.  18 g  1  . Blood Glucose Monitoring Suppl (ONE TOUCH ULTRA 2) W/DEVICE KIT 1 Device by Does not apply route once.  1 each  0  . Calcium Carb-Cholecalciferol 600-800 MG-UNIT TABS Take 1 tablet by mouth daily.      . cetirizine (ZYRTEC) 10 MG tablet Take 10 mg by mouth at bedtime.       . DULERA 100-5 MCG/ACT AERO Inhale 2 puffs into the lungs 2 (two) times daily as needed for shortness of breath.       . famotidine (PEPCID) 20 MG tablet Take 20 mg by mouth 2 (two) times daily.      . furosemide (LASIX) 40 MG tablet 63m in the am and 428min the pm until weight of 219 is reached.      .Marland Kitchen  glucose blood (ONE TOUCH ULTRA TEST) test strip Pt to test 3-4x daily due to labile sugars Dx. 250.02  100 each  12  . hydrALAZINE (APRESOLINE) 50 MG tablet Take 50 mg by mouth daily.      . Lancets (ONETOUCH ULTRASOFT) lancets Use 1 per day dx code 249.00  100 each  12  . levothyroxine (SYNTHROID, LEVOTHROID) 88 MCG tablet Take 88 mcg by mouth daily before breakfast.      . nebivolol (BYSTOLIC) 10 MG tablet Take 20 mg by mouth daily.      [name] DELICA LANCETS 76B MISC Use to check blood sugar once daily as instructed. Dx code: 790.29  100 each  2  . polyethylene glycol powder (GLYCOLAX/MIRALAX) powder Take 17 g by mouth daily.  850  g  1  . predniSONE (DELTASONE) 50 MG tablet Take 20 mg by mouth daily with breakfast.       . repaglinide (PRANDIN) 0.5 MG tablet Take 1 tablet (0.5 mg total) by mouth 3 (three) times daily before meals.  90 tablet  11   No current facility-administered medications for this visit.    SURGICAL HISTORY:  Past Surgical History  Procedure Laterality Date  . Knee arthroscopy Bilateral   . Anterior cervical decomp/discectomy fusion  2000    C5-C6  . Renal artery stent Right 11/08/2013    Archie Endo 11/08/2013  . Tonsillectomy and adenoidectomy  ?1946  . Cholecystectomy  1970's  . Vaginal hysterectomy  1970  . Dilation and curettage of uterus  1960's  . Skin cancer excision    . Coronary angiogram      Hx: of 2/ 2015  . Cardiac catheterization      Hx: of 2/ 2015  . Video bronchoscopy N/A 12/15/2013    Procedure: VIDEO BRONCHOSCOPY;  Surgeon: Grace Isaac, MD;  Location: [facility] OR;  Service: Thoracic;  Laterality: N/A;  . Video assisted thoracoscopy (vats)/wedge resection Left 12/15/2013    Procedure: VIDEO ASSISTED THORACOSCOPY (VATS)/WEDGE RESECTION;  Surgeon: Grace Isaac, MD;  Location: [facility];  Service: Thoracic;  Laterality: Left;  . Lobectomy Left 12/15/2013    Procedure: LOBECTOMY;  Surgeon: Grace Isaac, MD;  Location: [facility] OR;  Service: Thoracic;  Laterality: Left;    REVIEW OF SYSTEMS:  A comprehensive review of systems was negative except for: Respiratory: positive for dyspnea on exertion   PHYSICAL EXAMINATION: General appearance: alert, cooperative and no distress Head: Normocephalic, without obvious abnormality, atraumatic Neck: no adenopathy, no JVD, supple, symmetrical, trachea midline and thyroid not enlarged, symmetric, no tenderness/mass/nodules Lymph nodes: Cervical, supraclavicular, and axillary nodes normal. Resp: clear to auscultation bilaterally Back: symmetric, no curvature. ROM normal. No CVA tenderness. Cardio: regular rate and rhythm, S1, S2 normal, no  murmur, click, rub or gallop GI: soft, non-tender; bowel sounds normal; no masses,  no organomegaly Extremities: extremities normal, atraumatic, no cyanosis or edema  ECOG PERFORMANCE STATUS: 1 - Symptomatic but completely ambulatory  Blood pressure 149/68, pulse 64, temperature 98.3 F (36.8 C), temperature source Oral, resp. rate 19, height _0  (1.651 m), weight 224 lb 11.2 oz (101.923 kg), SpO2 100.00%.  LABORATORY DATA: Lab Results  Component Value Date   WBC 11.1* 08/19/2014   HGB 10.8* 08/19/2014   HCT 33.9* 08/19/2014   MCV 93.1 08/19/2014   PLT 284 08/19/2014      Chemistry      Component Value Date/Time   NA 147* 08/19/2014 0806   NA 136 06/23/2014 1227   K  3.8 08/19/2014 0806   K 4.1 06/23/2014 1227   CL 96 06/23/2014 1227   CO2 29 08/19/2014 0806   CO2 30 06/23/2014 1227   BUN 61.5* 08/19/2014 0806   BUN 67* 06/23/2014 1227   CREATININE 2.0* 08/19/2014 0806   CREATININE 1.9* 06/23/2014 1227   CREATININE 2.34* 02/14/2014 1030      Component Value Date/Time   CALCIUM 9.6 08/19/2014 0806   CALCIUM 9.0 06/23/2014 1227   ALKPHOS 49 08/19/2014 0806   ALKPHOS 46 06/23/2014 1227   AST 13 08/19/2014 0806   AST 23 06/23/2014 1227   ALT 15 08/19/2014 0806   ALT 27 06/23/2014 1227   BILITOT 0.33 08/19/2014 0806   BILITOT 0.9 06/23/2014 1227       RADIOGRAPHIC STUDIES: Ct Chest Wo Contrast  08/19/2014   CLINICAL DATA:  Left lower lobe lung cancer diagnosed 1/15. Surgery only. Restaging.Lung cancer, main bronchus, left C34.02 (ICD-10-CM) Malignant neoplasm of lower lobe of left lung C34.32 (ICD-10-CM)  EXAM: CT CHEST WITHOUT CONTRAST  TECHNIQUE: Multidetector CT imaging of the chest was performed following the standard protocol without IV contrast.  COMPARISON:  06/13/2014  FINDINGS: Lungs/Pleura: Nodularity along the non dependent trachea, including on images 7 through 11. Similar in configuration to on the prior exam, and back to 11/05/2013  Surgical changes of left lower  lobectomy.  Mild centrilobular emphysema. 2 mm right apical lung nodule on image 9 is unchanged.  Central superior segment right lower lobe ground-glass opacity on image 28 of series 5. This measures 2.7 cm and is favored to represent an area of volume loss/ atelectasis. This is new.  Subpleural posterior right upper lobe 2 mm nodule is unchanged on image 18.  Left-sided pleural thickening is similar with resolution of trace fluid.  Heart/Mediastinum: No supraclavicular adenopathy. Aortic and branch vessel atherosclerosis. Mild cardiomegaly. No pericardial effusion. No mediastinal or definite hilar adenopathy, given limitations of unenhanced CT. Small hiatal hernia. Left hemidiaphragm elevation.  Upper Abdomen: Cholecystectomy. Normal imaged portions of the liver, spleen, pancreas, adrenal glands. Cholecystectomy. Too small to characterize upper pole left renal lesion, most likely a cyst. This measures approximately 8 mm.  Bones/Musculoskeletal:  No acute osseous abnormality.  IMPRESSION: 1. Status post left lower lobectomy, without evidence of recurrent or metastatic disease. 2. Persistent left pleural thickening. 3. Similar indeterminate pulmonary nodules. 4. Nodularity along the non dependent trachea. Given presence over prior exams, suspicious for tracheal polyps. Recommend attention on follow-up. 5. A new area of ground-glass opacity within the central right lower lobe. Favored to represent subsegmental atelectasis. Recommend attention on follow-up.   Electronically Signed   By: Abigail Miyamoto M.D.   On: 08/19/2014 13:18    ASSESSMENT AND PLAN: Ms. a very pleasant 73 years old white female with history of stage IA non-small cell lung cancer status post left lower lobectomy with lymph node dissection. The patient has been observation and she has no evidence for disease recurrence on the recent scan. I discussed the scan results with the patient today. I recommended for her to continue on observation with  repeat CT scan of the chest in 6 months for evaluation of her disease. She was advised to call immediately if she has any concerning symptoms in the interval. The patient voices understanding of current disease status and treatment options and is in agreement with the current care plan.  All questions were answered. The patient knows to call the clinic with any problems, questions or concerns. We can certainly see  the patient much sooner if necessary.  Disclaimer: This note was dictated with voice recognition software. Similar sounding words can inadvertently be transcribed and may not be corrected upon review.

## 2014-08-22 NOTE — Telephone Encounter (Signed)
gv adn printed appt sched and avs for pt for April 2016 °

## 2014-08-23 ENCOUNTER — Other Ambulatory Visit: Payer: Self-pay | Admitting: General Practice

## 2014-08-23 ENCOUNTER — Encounter: Payer: Self-pay | Admitting: Physician Assistant

## 2014-08-23 ENCOUNTER — Ambulatory Visit (INDEPENDENT_AMBULATORY_CARE_PROVIDER_SITE_OTHER): Payer: Medicare Other | Admitting: Physician Assistant

## 2014-08-23 ENCOUNTER — Ambulatory Visit: Payer: Medicare Other | Admitting: Physical Therapy

## 2014-08-23 ENCOUNTER — Telehealth: Payer: Self-pay | Admitting: Family Medicine

## 2014-08-23 VITALS — BP 149/54 | HR 63 | Temp 98.5°F | Ht 65.0 in | Wt 226.0 lb

## 2014-08-23 DIAGNOSIS — R600 Localized edema: Secondary | ICD-10-CM

## 2014-08-23 DIAGNOSIS — Z23 Encounter for immunization: Secondary | ICD-10-CM | POA: Diagnosis not present

## 2014-08-23 DIAGNOSIS — I2510 Atherosclerotic heart disease of native coronary artery without angina pectoris: Secondary | ICD-10-CM

## 2014-08-23 MED ORDER — NEBIVOLOL HCL 10 MG PO TABS: 20.0000 mg | ORAL_TABLET | Freq: Every day | ORAL | Status: DC

## 2014-08-23 NOTE — Progress Notes (Signed)
Pre visit review using our clinic review tool, if applicable. No additional management support is needed unless otherwise documented below in the visit note. 

## 2014-08-23 NOTE — Telephone Encounter (Signed)
Patient Information:  Caller Name: Anaka  Phone: [phone]  Patient: Susan Pittman, Susan Pittman  Gender: Female  DOB: 06-16-1941  Age: 73 Years  PCP: [name].  Office Follow Up:  Does the office need to follow up with this patient?: No  Instructions For The Office: N/A   Symptoms  Reason For Call & Symptoms: Pt is calling about her feet being swollen and painful. Onset Thursday. Both feet are swollen and painful to the point where it is too painful to walk on them today.  Reviewed Health History In EMR: Yes  Reviewed Medications In EMR: Yes  Reviewed Allergies In EMR: Yes  Reviewed Surgeries / Procedures: Yes  Date of Onset of Symptoms: 08/18/2014  Guideline(s) Used:  Foot Pain  Disposition Per Guideline:   See Today in Office  Reason For Disposition Reached:   Severe pain (e.g., excruciating, unable to do any normal activities)  Advice Given:  N/A  Patient Will Follow Care Advice:  YES  Appointment Scheduled:  08/23/2014 14:30:00 Appointment Scheduled Provider:  Raiford Noble "Einar Pheasant"

## 2014-08-23 NOTE — Progress Notes (Signed)
Patient presents to clinic today c/o foot and ankle pain bilaterally x 6 days.  Patient with history of steroid-induced diabetes, currently well-controlled with post-prandial Repaglinide. Does have neuropathic pain but states this is different.  Recently saw Nephrologist who increased Lasix to 80 mg Am and 40 PM. Endorses great improvement in edema.  Is not wearing compression stockings.  Past Medical History  Diagnosis Date  . Asthma   . Hypertension   . GERD (gastroesophageal reflux disease)   . Allergy   . Hypothyroid   . Osteoporosis   . Gastric ulcer   . Hiatal hernia   . Hyperlipidemia     "don't take RX for it" (11/08/2013)  . Renal artery stenosis     with stent placement  . Lung mass     superior segment LLL/notes 11/08/2013  . Pneumonia     "I've had it ?3 times" (11/08/2013)  . Arthritis     "knees, hips, back, hands" (11/08/2013)  . DJD (degenerative joint disease)   . Lung nodule/ adenoca > LLobectomy 12/15/13  11/08/2013    PET 11/23/2013 1. Dominant sub solid left lower lobe nodule does not demonstrate significantly increased metabolic activity. However, morphologically, adenocarcinoma is still a concern.   - Spirometry 11/24/13 wnl  - 11/24/2013 T surg eval rec> LLower lobectomy 12/15/2013 for adenoca   . CAD (coronary artery disease), non obstructive by cardiac cath 12/12/13 01/24/2014  . Basal cell carcinoma     "cut them off face & right shoulder" (11/08/2013)  . Diastolic CHF   . CKD (chronic kidney disease), stage III   . Nephropathy     Current Outpatient Prescriptions on File Prior to Visit  Medication Sig Dispense Refill  . albuterol (PROVENTIL HFA;VENTOLIN HFA) 108 (90 BASE) MCG/ACT inhaler Inhale 1-2 puffs into the lungs every 6 (six) hours as needed for wheezing or shortness of breath.  18 g  1  . Blood Glucose Monitoring Suppl (ONE TOUCH ULTRA 2) W/DEVICE KIT 1 Device by Does not apply route once.  1 each  0  . Calcium Carb-Cholecalciferol 600-800 MG-UNIT TABS Take 1  tablet by mouth daily.      . cetirizine (ZYRTEC) 10 MG tablet Take 10 mg by mouth at bedtime.       . DULERA 100-5 MCG/ACT AERO Inhale 2 puffs into the lungs 2 (two) times daily as needed for shortness of breath.       . famotidine (PEPCID) 20 MG tablet Take 20 mg by mouth 2 (two) times daily.      . furosemide (LASIX) 40 MG tablet 20m in the am and 417min the pm until weight of 219 is reached.      . glucose blood (ONE TOUCH ULTRA TEST) test strip Pt to test 3-4x daily due to labile sugars Dx. 250.02  100 each  12  . hydrALAZINE (APRESOLINE) 50 MG tablet Take 50 mg by mouth daily.      . Lancets (ONETOUCH ULTRASOFT) lancets Use 1 per day dx code 249.00  100 each  12  . levothyroxine (SYNTHROID, LEVOTHROID) 88 MCG tablet Take 88 mcg by mouth daily before breakfast.      . ONETOUCH DELICA LANCETS 3337TISC Use to check blood sugar once daily as instructed. Dx code: 790.29  100 each  2  . polyethylene glycol powder (GLYCOLAX/MIRALAX) powder Take 17 g by mouth daily.  850 g  1  . predniSONE (DELTASONE) 50 MG tablet Take 20 mg by mouth daily with  breakfast.       . repaglinide (PRANDIN) 0.5 MG tablet Take 1 tablet (0.5 mg total) by mouth 3 (three) times daily before meals.  90 tablet  11   No current facility-administered medications on file prior to visit.    Allergies  Allergen Reactions  . Benazepril Hcl Anaphylaxis and Cough  . Loratadine Anaphylaxis    anaphylaxis  . Celecoxib     REACTION: aching  . Lisinopril     REACTION: cough  . Allegra [Fexofenadine Hcl]     Severe back pain  . Sulfa Antibiotics Hives    Family History  Problem Relation Age of Onset  . Breast cancer Sister   . Emphysema Father     smoked  . Allergies Sister   . Allergies Brother   . Heart disease Mother   . Heart disease Father   . Diabetes Father   . Diabetes Brother   . Stroke Father   . Heart failure Neg Hx     History   Social History  . Marital Status: Married    Spouse Name: N/A     Number of Children: N/A  . Years of Education: N/A   Occupational History  . Retired    Social History Main Topics  . Smoking status: Never Smoker   . Smokeless tobacco: Never Used  . Alcohol Use: No  . Drug Use: No  . Sexual Activity: Yes   Other Topics Concern  . None   Social History Narrative  . None   Review of Systems - See HPI.  All other ROS are negative.  BP 149/54  Pulse 63  Temp(Src) 98.5 F (36.9 C) (Oral)  Ht _0  (1.651 m)  Wt 226 lb (102.513 kg)  BMI 37.61 kg/m2  SpO2 100%  Physical Exam  Vitals reviewed. Constitutional: She is oriented to person, place, and time and well-developed, well-nourished, and in no distress.  HENT:  Head: Normocephalic and atraumatic.  Eyes: Conjunctivae are normal. Pupils are equal, round, and reactive to light.  Neck: Neck supple. No thyromegaly present.  Cardiovascular: Normal rate, regular rhythm, normal heart sounds and intact distal pulses.   Pulses:      Dorsalis pedis pulses are 2+ on the right side, and 2+ on the left side.       Posterior tibial pulses are 2+ on the right side, and 2+ on the left side.  1+ pitting edema of feet and ankles bilaterally.  Pulmonary/Chest: Effort normal and breath sounds normal. No respiratory distress. She has no wheezes. She has no rales. She exhibits no tenderness.  Musculoskeletal:       Right ankle: She exhibits swelling. She exhibits normal pulse. Tenderness. AITFL tenderness found. No lateral malleolus, no medial malleolus, no CF ligament, no posterior TFL, no head of 5th metatarsal and no proximal fibula tenderness found. Achilles tendon normal.       Left ankle: Normal.  Lymphadenopathy:    She has no cervical adenopathy.  Neurological: She is alert and oriented to person, place, and time.  Skin: Skin is warm and dry. No rash noted.  Psychiatric: Affect normal.    Recent Results (from the past 2160 hour(s))  CBC WITH DIFFERENTIAL     Status: Abnormal   Collection Time     05/27/14 11:53 AM      Result Value Ref Range   WBC 12.7 (*) 4.0 - 10.5 K/uL   RBC 4.33  3.87 - 5.11 MIL/uL   Hemoglobin 12.6  12.0 - 15.0 g/dL   HCT 38.1  36.0 - 46.0 %   MCV 88.0  78.0 - 100.0 fL   MCH 29.1  26.0 - 34.0 pg   MCHC 33.1  30.0 - 36.0 g/dL   RDW 17.4 (*) 11.5 - 15.5 %   Platelets 163  150 - 400 K/uL   Neutrophils Relative % 91 (*) 43 - 77 %   Neutro Abs 11.5 (*) 1.7 - 7.7 K/uL   Lymphocytes Relative 7 (*) 12 - 46 %   Lymphs Abs 0.8  0.7 - 4.0 K/uL   Monocytes Relative 2 (*) 3 - 12 %   Monocytes Absolute 0.3  0.1 - 1.0 K/uL   Eosinophils Relative 0  0 - 5 %   Eosinophils Absolute 0.0  0.0 - 0.7 K/uL   Basophils Relative 0  0 - 1 %   Basophils Absolute 0.0  0.0 - 0.1 K/uL  BASIC METABOLIC PANEL     Status: Abnormal   Collection Time    05/27/14 11:53 AM      Result Value Ref Range   Sodium 139  137 - 147 mEq/L   Potassium 4.1  3.7 - 5.3 mEq/L   Chloride 95 (*) 96 - 112 mEq/L   CO2 28  19 - 32 mEq/L   Glucose, Bld 209 (*) 70 - 99 mg/dL   BUN 65 (*) 6 - 23 mg/dL   Creatinine, Ser 1.84 (*) 0.50 - 1.10 mg/dL   Calcium 8.4  8.4 - 10.5 mg/dL   GFR calc non Af Amer 26 (*) >90 mL/min   GFR calc Af Amer 30 (*) >90 mL/min   Comment: (NOTE)     The eGFR has been calculated using the CKD EPI equation.     This calculation has not been validated in all clinical situations.     eGFR's persistently <90 mL/min signify possible Chronic Kidney     Disease.   Anion gap 16 (*) 5 - 15  PRO B NATRIURETIC PEPTIDE     Status: Abnormal   Collection Time    05/27/14 11:53 AM      Result Value Ref Range   Pro B Natriuretic peptide (BNP) 10297.0 (*) 0 - 450 pg/mL  TROPONIN I     Status: Abnormal   Collection Time    05/27/14 11:53 AM      Result Value Ref Range   Troponin I 0.53 (*) <0.30 ng/mL   Comment:            Due to the release kinetics of cTnI,     a negative result within the first hours     of the onset of symptoms does not rule out     myocardial infarction with  certainty.     If myocardial infarction is still suspected,     repeat the test at appropriate intervals.     CRITICAL RESULT CALLED TO, READ BACK BY AND VERIFIED WITH:     GENTILE,E RN @ 1301 05/27/14 LEONARD,A  I-STAT TROPOININ, ED     Status: Abnormal   Collection Time    05/27/14 11:59 AM      Result Value Ref Range   Troponin i, poc 0.41 (*) 0.00 - 0.08 ng/mL   Comment NOTIFIED PHYSICIAN     Comment 3            Comment: Due to the release kinetics of cTnI,     a negative result within the first hours  of the onset of symptoms does not rule out     myocardial infarction with certainty.     If myocardial infarction is still suspected,     repeat the test at appropriate intervals.  CBG MONITORING, ED     Status: Abnormal   Collection Time    05/27/14 12:27 PM      Result Value Ref Range   Glucose-Capillary 168 (*) 70 - 99 mg/dL   Comment 1 Notify RN     Comment 2 Documented in Chart    URINALYSIS, ROUTINE W REFLEX MICROSCOPIC     Status: Abnormal   Collection Time    05/27/14 12:40 PM      Result Value Ref Range   Color, Urine YELLOW  YELLOW   APPearance CLEAR  CLEAR   Specific Gravity, Urine 1.014  1.005 - 1.030   pH 5.0  5.0 - 8.0   Glucose, UA NEGATIVE  NEGATIVE mg/dL   Hgb urine dipstick SMALL (*) NEGATIVE   Bilirubin Urine NEGATIVE  NEGATIVE   Ketones, ur NEGATIVE  NEGATIVE mg/dL   Protein, ur 100 (*) NEGATIVE mg/dL   Urobilinogen, UA 0.2  0.0 - 1.0 mg/dL   Nitrite NEGATIVE  NEGATIVE   Leukocytes, UA SMALL (*) NEGATIVE  URINE MICROSCOPIC-ADD ON     Status: Abnormal   Collection Time    05/27/14 12:40 PM      Result Value Ref Range   Squamous Epithelial / LPF FEW (*) RARE   WBC, UA 3-6  <3 WBC/hpf   RBC / HPF 7-10  <3 RBC/hpf   Bacteria, UA FEW (*) RARE   Casts GRANULAR CAST (*) NEGATIVE   Comment: HYALINE CASTS  GLUCOSE, CAPILLARY     Status: Abnormal   Collection Time    05/27/14  6:46 PM      Result Value Ref Range   Glucose-Capillary 245 (*) 70 - 99  mg/dL  TSH     Status: None   Collection Time    05/27/14  7:17 PM      Result Value Ref Range   TSH 3.090  0.350 - 4.500 uIU/mL  HEMOGLOBIN A1C     Status: Abnormal   Collection Time    05/27/14  7:17 PM      Result Value Ref Range   Hemoglobin A1C 7.4 (*) <5.7 %   Comment: (NOTE)                                                                               According to the ADA Clinical Practice Recommendations for 2011, when     HbA1c is used as a screening test:      >=6.5%   Diagnostic of Diabetes Mellitus               (if abnormal result is confirmed)     5.7-6.4%   Increased risk of developing Diabetes Mellitus     References:Diagnosis and Classification of Diabetes Mellitus,Diabetes     ZDGU,4403,47(QQVZD 1):S62-S69 and Standards of Medical Care in             Diabetes - 2011,Diabetes Care,2011,34 (Suppl 1):S11-S61.   Mean Plasma Glucose 166 (*) <117 mg/dL  Comment: Performed at [facility]  TROPONIN I     Status: Abnormal   Collection Time    05/27/14  7:17 PM      Result Value Ref Range   Troponin I 0.47 (*) <0.30 ng/mL   Comment:            Due to the release kinetics of cTnI,     a negative result within the first hours     of the onset of symptoms does not rule out     myocardial infarction with certainty.     If myocardial infarction is still suspected,     repeat the test at appropriate intervals.     CRITICAL VALUE NOTED.  VALUE IS CONSISTENT WITH PREVIOUSLY REPORTED AND CALLED VALUE.  GLUCOSE, CAPILLARY     Status: Abnormal   Collection Time    05/27/14  8:29 PM      Result Value Ref Range   Glucose-Capillary 245 (*) 70 - 99 mg/dL   Comment 1 Notify RN     Comment 2 Documented in Chart    BASIC METABOLIC PANEL     Status: Abnormal   Collection Time    05/28/14  1:05 AM      Result Value Ref Range   Sodium 142  137 - 147 mEq/L   Potassium 3.3 (*) 3.7 - 5.3 mEq/L   Comment: DELTA CHECK NOTED   Chloride 98  96 - 112 mEq/L   CO2 29  19 - 32  mEq/L   Glucose, Bld 93  70 - 99 mg/dL   BUN 59 (*) 6 - 23 mg/dL   Creatinine, Ser 1.57 (*) 0.50 - 1.10 mg/dL   Calcium 8.2 (*) 8.4 - 10.5 mg/dL   GFR calc non Af Amer 31 (*) >90 mL/min   GFR calc Af Amer 36 (*) >90 mL/min   Comment: (NOTE)     The eGFR has been calculated using the CKD EPI equation.     This calculation has not been validated in all clinical situations.     eGFR's persistently <90 mL/min signify possible Chronic Kidney     Disease.   Anion gap 15  5 - 15  TROPONIN I     Status: Abnormal   Collection Time    05/28/14  1:05 AM      Result Value Ref Range   Troponin I 0.31 (*) <0.30 ng/mL   Comment:            Due to the release kinetics of cTnI,     a negative result within the first hours     of the onset of symptoms does not rule out     myocardial infarction with certainty.     If myocardial infarction is still suspected,     repeat the test at appropriate intervals.     CRITICAL VALUE NOTED.  VALUE IS CONSISTENT WITH PREVIOUSLY REPORTED AND CALLED VALUE.  GLUCOSE, CAPILLARY     Status: None   Collection Time    05/28/14  6:49 AM      Result Value Ref Range   Glucose-Capillary 88  70 - 99 mg/dL  GLUCOSE, CAPILLARY     Status: Abnormal   Collection Time    05/28/14 11:59 AM      Result Value Ref Range   Glucose-Capillary 153 (*) 70 - 99 mg/dL   Comment 1 Documented in Chart     Comment 2 Notify RN    GLUCOSE, CAPILLARY  Status: Abnormal   Collection Time    05/28/14  4:36 PM      Result Value Ref Range   Glucose-Capillary 227 (*) 70 - 99 mg/dL   Comment 1 Documented in Chart     Comment 2 Notify RN    GLUCOSE, CAPILLARY     Status: Abnormal   Collection Time    05/28/14  8:58 PM      Result Value Ref Range   Glucose-Capillary 237 (*) 70 - 99 mg/dL  BASIC METABOLIC PANEL     Status: Abnormal   Collection Time    05/29/14  3:30 AM      Result Value Ref Range   Sodium 142  137 - 147 mEq/L   Potassium 3.2 (*) 3.7 - 5.3 mEq/L   Chloride 97  96 -  112 mEq/L   CO2 31  19 - 32 mEq/L   Glucose, Bld 120 (*) 70 - 99 mg/dL   BUN 57 (*) 6 - 23 mg/dL   Creatinine, Ser 1.58 (*) 0.50 - 1.10 mg/dL   Calcium 8.2 (*) 8.4 - 10.5 mg/dL   GFR calc non Af Amer 31 (*) >90 mL/min   GFR calc Af Amer 36 (*) >90 mL/min   Comment: (NOTE)     The eGFR has been calculated using the CKD EPI equation.     This calculation has not been validated in all clinical situations.     eGFR's persistently <90 mL/min signify possible Chronic Kidney     Disease.   Anion gap 14  5 - 15  GLUCOSE, CAPILLARY     Status: Abnormal   Collection Time    05/29/14 11:13 AM      Result Value Ref Range   Glucose-Capillary 183 (*) 70 - 99 mg/dL   Comment 1 Documented in Chart     Comment 2 Notify RN    GLUCOSE, CAPILLARY     Status: Abnormal   Collection Time    05/29/14  4:36 PM      Result Value Ref Range   Glucose-Capillary 280 (*) 70 - 99 mg/dL   Comment 1 Documented in Chart     Comment 2 Notify RN    GLUCOSE, CAPILLARY     Status: Abnormal   Collection Time    05/29/14  9:40 PM      Result Value Ref Range   Glucose-Capillary 202 (*) 70 - 99 mg/dL   Comment 1 Notify RN     Comment 2 Documented in Chart    BASIC METABOLIC PANEL     Status: Abnormal   Collection Time    05/30/14  6:14 AM      Result Value Ref Range   Sodium 144  137 - 147 mEq/L   Potassium 3.4 (*) 3.7 - 5.3 mEq/L   Chloride 99  96 - 112 mEq/L   CO2 32  19 - 32 mEq/L   Glucose, Bld 118 (*) 70 - 99 mg/dL   BUN 54 (*) 6 - 23 mg/dL   Creatinine, Ser 1.54 (*) 0.50 - 1.10 mg/dL   Calcium 8.4  8.4 - 10.5 mg/dL   GFR calc non Af Amer 32 (*) >90 mL/min   GFR calc Af Amer 37 (*) >90 mL/min   Comment: (NOTE)     The eGFR has been calculated using the CKD EPI equation.     This calculation has not been validated in all clinical situations.     eGFR's persistently <90 mL/min signify  possible Chronic Kidney     Disease.   Anion gap 13  5 - 15  PRO B NATRIURETIC PEPTIDE     Status: Abnormal    Collection Time    05/30/14  6:14 AM      Result Value Ref Range   Pro B Natriuretic peptide (BNP) 10599.0 (*) 0 - 450 pg/mL  GLUCOSE, CAPILLARY     Status: Abnormal   Collection Time    05/30/14  7:17 AM      Result Value Ref Range   Glucose-Capillary 109 (*) 70 - 99 mg/dL   Comment 1 Notify RN     Comment 2 Documented in Chart    GLUCOSE, CAPILLARY     Status: Abnormal   Collection Time    05/30/14 11:58 AM      Result Value Ref Range   Glucose-Capillary 279 (*) 70 - 99 mg/dL   Comment 1 Notify RN    BASIC METABOLIC PANEL     Status: Abnormal   Collection Time    06/09/14 12:37 PM      Result Value Ref Range   Sodium 140  135 - 145 mEq/L   Potassium 3.4 (*) 3.5 - 5.1 mEq/L   Chloride 97  96 - 112 mEq/L   CO2 29  19 - 32 mEq/L   Glucose, Bld 130 (*) 70 - 99 mg/dL   BUN 74 (*) 6 - 23 mg/dL   Creatinine, Ser 2.1 (*) 0.4 - 1.2 mg/dL   Calcium 9.1  8.4 - 10.5 mg/dL   GFR 24.77 (*) >60.00 mL/min  BASIC METABOLIC PANEL     Status: Abnormal   Collection Time    06/13/14  9:41 AM      Result Value Ref Range   Sodium 142  137 - 147 mEq/L   Potassium 3.9  3.7 - 5.3 mEq/L   Chloride 97  96 - 112 mEq/L   CO2 29  19 - 32 mEq/L   Glucose, Bld 115 (*) 70 - 99 mg/dL   BUN 69 (*) 6 - 23 mg/dL   Creatinine, Ser 1.77 (*) 0.50 - 1.10 mg/dL   Calcium 9.2  8.4 - 10.5 mg/dL   GFR calc non Af Amer 27 (*) >90 mL/min   GFR calc Af Amer 31 (*) >90 mL/min   Comment: (NOTE)     The eGFR has been calculated using the CKD EPI equation.     This calculation has not been validated in all clinical situations.     eGFR's persistently <90 mL/min signify possible Chronic Kidney     Disease.   Anion gap 16 (*) 5 - 15  CBC WITH DIFFERENTIAL     Status: Abnormal   Collection Time    06/13/14  9:41 AM      Result Value Ref Range   WBC 14.0 (*) 4.0 - 10.5 K/uL   RBC 3.93  3.87 - 5.11 MIL/uL   Hemoglobin 11.7 (*) 12.0 - 15.0 g/dL   HCT 35.1 (*) 36.0 - 46.0 %   MCV 89.3  78.0 - 100.0 fL   MCH 29.8   26.0 - 34.0 pg   MCHC 33.3  30.0 - 36.0 g/dL   RDW 18.9 (*) 11.5 - 15.5 %   Platelets 203  150 - 400 K/uL   Neutrophils Relative % 88 (*) 43 - 77 %   Lymphocytes Relative 9 (*) 12 - 46 %   Monocytes Relative 3  3 - 12 %  Eosinophils Relative 0  0 - 5 %   Basophils Relative 0  0 - 1 %   Neutro Abs 12.3 (*) 1.7 - 7.7 K/uL   Lymphs Abs 1.3  0.7 - 4.0 K/uL   Monocytes Absolute 0.4  0.1 - 1.0 K/uL   Eosinophils Absolute 0.0  0.0 - 0.7 K/uL   Basophils Absolute 0.0  0.0 - 0.1 K/uL   RBC Morphology STOMATOCYTES     Comment: POLYCHROMASIA PRESENT   WBC Morphology MILD LEFT SHIFT (1-5% METAS, OCC MYELO, OCC BANDS)    TROPONIN I     Status: None   Collection Time    06/13/14  9:41 AM      Result Value Ref Range   Troponin I <0.30  <0.30 ng/mL   Comment:            Due to the release kinetics of cTnI,     a negative result within the first hours     of the onset of symptoms does not rule out     myocardial infarction with certainty.     If myocardial infarction is still suspected,     repeat the test at appropriate intervals.  PRO B NATRIURETIC PEPTIDE     Status: Abnormal   Collection Time    06/13/14  9:41 AM      Result Value Ref Range   Pro B Natriuretic peptide (BNP) 7258.0 (*) 0 - 450 pg/mL  LACTIC ACID, PLASMA     Status: Abnormal   Collection Time    06/13/14  9:41 AM      Result Value Ref Range   Lactic Acid, Venous 2.3 (*) 0.5 - 2.2 mmol/L  URINE CULTURE     Status: None   Collection Time    06/13/14 10:14 AM      Result Value Ref Range   Specimen Description URINE, RANDOM     Special Requests NONE     Culture  Setup Time       Value: 06/13/2014 10:14     Performed at [facility]       Value: 65,000 COLONIES/ML     Performed at [facility]   Culture       Value: Multiple bacterial morphotypes present, none predominant. Suggest appropriate recollection if clinically indicated.     Performed at [facility]   Report Status  06/14/2014 FINAL    URINALYSIS, ROUTINE W REFLEX MICROSCOPIC     Status: Abnormal   Collection Time    06/13/14 10:14 AM      Result Value Ref Range   Color, Urine YELLOW  YELLOW   APPearance CLEAR  CLEAR   Specific Gravity, Urine 1.008  1.005 - 1.030   pH 6.0  5.0 - 8.0   Glucose, UA NEGATIVE  NEGATIVE mg/dL   Hgb urine dipstick SMALL (*) NEGATIVE   Bilirubin Urine NEGATIVE  NEGATIVE   Ketones, ur NEGATIVE  NEGATIVE mg/dL   Protein, ur 30 (*) NEGATIVE mg/dL   Urobilinogen, UA 0.2  0.0 - 1.0 mg/dL   Nitrite NEGATIVE  NEGATIVE   Leukocytes, UA MODERATE (*) NEGATIVE  URINE MICROSCOPIC-ADD ON     Status: Abnormal   Collection Time    06/13/14 10:14 AM      Result Value Ref Range   Squamous Epithelial / LPF FEW (*) RARE   WBC, UA 7-10  <3 WBC/hpf   Bacteria, UA RARE  RARE  CULTURE, BLOOD (ROUTINE  X 2)     Status: None   Collection Time    06/13/14  1:40 PM      Result Value Ref Range   Specimen Description BLOOD LEFT ANTECUBITAL     Special Requests BOTTLES DRAWN AEROBIC AND ANAEROBIC 10CC     Culture  Setup Time       Value: 06/13/2014 19:03     Performed at [facility]   Culture       Value: NO GROWTH 5 DAYS     Performed at [facility]   Report Status 06/20/2014 FINAL    CULTURE, BLOOD (ROUTINE X 2)     Status: None   Collection Time    06/13/14  1:55 PM      Result Value Ref Range   Specimen Description BLOOD RIGHT HAND     Special Requests BOTTLES DRAWN AEROBIC ONLY 10CC     Culture  Setup Time       Value: 06/13/2014 19:01     Performed at [facility]   Culture       Value: NO GROWTH 5 DAYS     Performed at [facility]   Report Status 06/20/2014 FINAL    CBC     Status: Abnormal   Collection Time    06/13/14  4:15 PM      Result Value Ref Range   WBC 10.9 (*) 4.0 - 10.5 K/uL   RBC 3.76 (*) 3.87 - 5.11 MIL/uL   Hemoglobin 11.2 (*) 12.0 - 15.0 g/dL   HCT 33.8 (*) 36.0 - 46.0 %   MCV 89.9  78.0 - 100.0 fL   MCH 29.8  26.0 -  34.0 pg   MCHC 33.1  30.0 - 36.0 g/dL   RDW 19.4 (*) 11.5 - 15.5 %   Platelets 183  150 - 400 K/uL  CREATININE, SERUM     Status: Abnormal   Collection Time    06/13/14  4:15 PM      Result Value Ref Range   Creatinine, Ser 1.81 (*) 0.50 - 1.10 mg/dL   GFR calc non Af Amer 26 (*) >90 mL/min   GFR calc Af Amer 30 (*) >90 mL/min   Comment: (NOTE)     The eGFR has been calculated using the CKD EPI equation.     This calculation has not been validated in all clinical situations.     eGFR's persistently <90 mL/min signify possible Chronic Kidney     Disease.  TSH     Status: Abnormal   Collection Time    06/13/14  4:15 PM      Result Value Ref Range   TSH 0.209 (*) 0.350 - 4.500 uIU/mL  GLUCOSE, CAPILLARY     Status: Abnormal   Collection Time    06/13/14  4:53 PM      Result Value Ref Range   Glucose-Capillary 235 (*) 70 - 99 mg/dL  GLUCOSE, CAPILLARY     Status: Abnormal   Collection Time    06/13/14  9:30 PM      Result Value Ref Range   Glucose-Capillary 164 (*) 70 - 99 mg/dL  CBC     Status: Abnormal   Collection Time    06/14/14  5:30 AM      Result Value Ref Range   WBC 7.7  4.0 - 10.5 K/uL   RBC 3.35 (*) 3.87 - 5.11 MIL/uL   Hemoglobin 9.8 (*) 12.0 - 15.0 g/dL  HCT 29.9 (*) 36.0 - 46.0 %   MCV 89.3  78.0 - 100.0 fL   MCH 29.3  26.0 - 34.0 pg   MCHC 32.8  30.0 - 36.0 g/dL   RDW 19.5 (*) 11.5 - 15.5 %   Platelets 168  150 - 400 K/uL  GLUCOSE, CAPILLARY     Status: Abnormal   Collection Time    06/14/14  8:19 AM      Result Value Ref Range   Glucose-Capillary 111 (*) 70 - 99 mg/dL  TSH     Status: None   Collection Time    06/14/14 11:42 AM      Result Value Ref Range   TSH 0.374  0.350 - 4.500 uIU/mL  T4, FREE     Status: Abnormal   Collection Time    06/14/14 11:42 AM      Result Value Ref Range   Free T4 2.28 (*) 0.80 - 1.80 ng/dL   Comment: Performed at [facility]  T3, FREE     Status: Abnormal   Collection Time    06/14/14 11:42 AM       Result Value Ref Range   T3, Free 1.9 (*) 2.3 - 4.2 pg/mL   Comment: Performed at [facility], CAPILLARY     Status: Abnormal   Collection Time    06/14/14 11:59 AM      Result Value Ref Range   Glucose-Capillary 169 (*) 70 - 99 mg/dL  GLUCOSE, CAPILLARY     Status: Abnormal   Collection Time    06/14/14  4:49 PM      Result Value Ref Range   Glucose-Capillary 202 (*) 70 - 99 mg/dL  GLUCOSE, CAPILLARY     Status: Abnormal   Collection Time    06/14/14  9:40 PM      Result Value Ref Range   Glucose-Capillary 157 (*) 70 - 99 mg/dL  GLUCOSE, CAPILLARY     Status: None   Collection Time    06/15/14  7:51 AM      Result Value Ref Range   Glucose-Capillary 89  70 - 99 mg/dL  GLUCOSE, CAPILLARY     Status: Abnormal   Collection Time    06/15/14 12:00 PM      Result Value Ref Range   Glucose-Capillary 155 (*) 70 - 99 mg/dL  CBC WITH DIFFERENTIAL     Status: Abnormal   Collection Time    06/23/14 12:27 PM      Result Value Ref Range   WBC 15.8 (*) 4.0 - 10.5 K/uL   RBC 3.62 (*) 3.87 - 5.11 Mil/uL   Hemoglobin 11.1 (*) 12.0 - 15.0 g/dL   HCT 32.9 (*) 36.0 - 46.0 %   MCV 91.0  78.0 - 100.0 fl   MCHC 33.9  30.0 - 36.0 g/dL   RDW 21.3 (*) 11.5 - 15.5 %   Platelets 233.0  150.0 - 400.0 K/uL   Neutrophils Relative % 90.9 Repeated and verified X2. (*) 43.0 - 77.0 %   Comment: smear reviewed   Lymphocytes Relative 6.4 Repeated and verified X2. (*) 12.0 - 46.0 %   Monocytes Relative 2.6 (*) 3.0 - 12.0 %   Eosinophils Relative 0.1  0.0 - 5.0 %   Basophils Relative 0.0  0.0 - 3.0 %   Neutro Abs 14.3 (*) 1.4 - 7.7 K/uL   Lymphs Abs 1.0  0.7 - 4.0 K/uL   Monocytes Absolute 0.4  0.1 -  1.0 K/uL   Eosinophils Absolute 0.0  0.0 - 0.7 K/uL   Basophils Absolute 0.0  0.0 - 0.1 K/uL  BASIC METABOLIC PANEL     Status: Abnormal   Collection Time    06/23/14 12:27 PM      Result Value Ref Range   Sodium 136  135 - 145 mEq/L   Potassium 4.1  3.5 - 5.1 mEq/L   Chloride 96  96  - 112 mEq/L   CO2 30  19 - 32 mEq/L   Glucose, Bld 131 (*) 70 - 99 mg/dL   BUN 67 (*) 6 - 23 mg/dL   Creatinine, Ser 1.9 (*) 0.4 - 1.2 mg/dL   Calcium 9.0  8.4 - 10.5 mg/dL   GFR 28.20 (*) >60.00 mL/min  HEPATIC FUNCTION PANEL     Status: Abnormal   Collection Time    06/23/14 12:27 PM      Result Value Ref Range   Total Bilirubin 0.9  0.2 - 1.2 mg/dL   Bilirubin, Direct 0.0  0.0 - 0.3 mg/dL   Alkaline Phosphatase 46  39 - 117 U/L   AST 23  0 - 37 U/L   ALT 27  0 - 35 U/L   Total Protein 6.0  6.0 - 8.3 g/dL   Albumin 3.3 (*) 3.5 - 5.2 g/dL  CBC WITH DIFFERENTIAL     Status: Abnormal   Collection Time    06/28/14  8:42 AM      Result Value Ref Range   WBC 8.6  4.0 - 10.5 K/uL   RBC 3.39 (*) 3.87 - 5.11 Mil/uL   Hemoglobin 10.4 (*) 12.0 - 15.0 g/dL   HCT 31.5 (*) 36.0 - 46.0 %   MCV 92.7  78.0 - 100.0 fl   MCHC 32.9  30.0 - 36.0 g/dL   RDW 20.9 (*) 11.5 - 15.5 %   Platelets 221.0  150.0 - 400.0 K/uL   Neutrophils Relative % 73.8  43.0 - 77.0 %   Lymphocytes Relative 20.7  12.0 - 46.0 %   Monocytes Relative 5.0  3.0 - 12.0 %   Eosinophils Relative 0.5  0.0 - 5.0 %   Basophils Relative 0.0  0.0 - 3.0 %   Neutro Abs 6.4  1.4 - 7.7 K/uL   Lymphs Abs 1.8  0.7 - 4.0 K/uL   Monocytes Absolute 0.4  0.1 - 1.0 K/uL   Eosinophils Absolute 0.0  0.0 - 0.7 K/uL   Basophils Absolute 0.0  0.0 - 0.1 K/uL  COMPREHENSIVE METABOLIC PANEL (LH73)     Status: Abnormal   Collection Time    08/19/14  8:06 AM      Result Value Ref Range   Sodium 147 (*) 136 - 145 mEq/L   Potassium 3.8  3.5 - 5.1 mEq/L   Chloride 107  98 - 109 mEq/L   CO2 29  22 - 29 mEq/L   Glucose 87  70 - 140 mg/dl   BUN 61.5 (*) 7.0 - 26.0 mg/dL   Creatinine 2.0 (*) 0.6 - 1.1 mg/dL   Total Bilirubin 0.33  0.20 - 1.20 mg/dL   Alkaline Phosphatase 49  40 - 150 U/L   AST 13  5 - 34 U/L   ALT 15  0 - 55 U/L   Total Protein 6.0 (*) 6.4 - 8.3 g/dL   Albumin 3.0 (*) 3.5 - 5.0 g/dL   Calcium 9.6  8.4 - 10.4 mg/dL   Anion Gap  12 (*) 3 -  11 mEq/L  CBC WITH DIFFERENTIAL     Status: Abnormal   Collection Time    08/19/14  8:21 AM      Result Value Ref Range   WBC 11.1 (*) 3.9 - 10.3 10e3/uL   NEUT# 8.2 (*) 1.5 - 6.5 10e3/uL   HGB 10.8 (*) 11.6 - 15.9 g/dL   HCT 33.9 (*) 34.8 - 46.6 %   Platelets 284  145 - 400 10e3/uL   MCV 93.1  79.5 - 101.0 fL   MCH 29.7  25.1 - 34.0 pg   MCHC 31.9  31.5 - 36.0 g/dL   RBC 3.64 (*) 3.70 - 5.45 10e6/uL   RDW 15.2 (*) 11.2 - 14.5 %   lymph# 2.0  0.9 - 3.3 10e3/uL   MONO# 0.8  0.1 - 0.9 10e3/uL   Eosinophils Absolute 0.1  0.0 - 0.5 10e3/uL   Basophils Absolute 0.1  0.0 - 0.1 10e3/uL   NEUT% 73.7  38.4 - 76.8 %   LYMPH% 18.3  14.0 - 49.7 %   MONO% 7.0  0.0 - 14.0 %   EOS% 0.5  0.0 - 7.0 %   BASO% 0.5  0.0 - 2.0 %   Assessment/Plan: Pedal edema Chronic peripheral edema noted.  Improving with increase in Lasix.  Suspect pain will improve with resolution of swelling and compression over musculature.  Topical Aspercreme. Compression stockings daily. IgA nephropathy limits pain medication.  Tylenol as directed by Nephrologist.

## 2014-08-23 NOTE — Patient Instructions (Signed)
Please continue Lasix as directed.  It is very important that you wear your compression stockings daily.  The compression will be very beneficial for pain.  Apply topical Aspercreme or Salon Pas to the area.  Take tylenol for pain.  Call or return to clinic if symptoms are not improving. Follow-up in 1 week.

## 2014-08-23 NOTE — Assessment & Plan Note (Signed)
Chronic peripheral edema noted.  Improving with increase in Lasix.  Suspect pain will improve with resolution of swelling and compression over musculature.  Topical Aspercreme. Compression stockings daily. IgA nephropathy limits pain medication.  Tylenol as directed by Nephrologist.

## 2014-08-23 NOTE — Telephone Encounter (Signed)
Office visit with Susan Aquas, PA-C noted for today (08/23/14) at 2:30pm.

## 2014-08-25 ENCOUNTER — Ambulatory Visit: Payer: Medicare Other | Admitting: Rehabilitation

## 2014-08-25 ENCOUNTER — Encounter (HOSPITAL_BASED_OUTPATIENT_CLINIC_OR_DEPARTMENT_OTHER): Payer: Self-pay | Admitting: Emergency Medicine

## 2014-08-25 ENCOUNTER — Emergency Department (HOSPITAL_BASED_OUTPATIENT_CLINIC_OR_DEPARTMENT_OTHER): Payer: Medicare Other

## 2014-08-25 ENCOUNTER — Ambulatory Visit: Payer: Medicare Other | Admitting: Medical

## 2014-08-25 ENCOUNTER — Emergency Department (HOSPITAL_BASED_OUTPATIENT_CLINIC_OR_DEPARTMENT_OTHER)
Admission: EM | Admit: 2014-08-25 | Discharge: 2014-08-25 | Disposition: A | Discharge status: Home / Self Care | Payer: Medicare Other | Attending: Emergency Medicine | Admitting: Emergency Medicine

## 2014-08-25 DIAGNOSIS — I129 Hypertensive chronic kidney disease with stage 1 through stage 4 chronic kidney disease, or unspecified chronic kidney disease: Secondary | ICD-10-CM | POA: Insufficient documentation

## 2014-08-25 DIAGNOSIS — J45909 Unspecified asthma, uncomplicated: Secondary | ICD-10-CM | POA: Insufficient documentation

## 2014-08-25 DIAGNOSIS — M79671 Pain in right foot: Secondary | ICD-10-CM | POA: Diagnosis not present

## 2014-08-25 DIAGNOSIS — Z7952 Long term (current) use of systemic steroids: Secondary | ICD-10-CM | POA: Diagnosis not present

## 2014-08-25 DIAGNOSIS — Z8669 Personal history of other diseases of the nervous system and sense organs: Secondary | ICD-10-CM | POA: Diagnosis not present

## 2014-08-25 DIAGNOSIS — E119 Type 2 diabetes mellitus without complications: Secondary | ICD-10-CM | POA: Insufficient documentation

## 2014-08-25 DIAGNOSIS — M7989 Other specified soft tissue disorders: Secondary | ICD-10-CM | POA: Diagnosis not present

## 2014-08-25 DIAGNOSIS — Z79899 Other long term (current) drug therapy: Secondary | ICD-10-CM | POA: Insufficient documentation

## 2014-08-25 DIAGNOSIS — K219 Gastro-esophageal reflux disease without esophagitis: Secondary | ICD-10-CM | POA: Diagnosis not present

## 2014-08-25 DIAGNOSIS — Z8701 Personal history of pneumonia (recurrent): Secondary | ICD-10-CM | POA: Diagnosis not present

## 2014-08-25 DIAGNOSIS — I2510 Atherosclerotic heart disease of native coronary artery without angina pectoris: Secondary | ICD-10-CM | POA: Diagnosis not present

## 2014-08-25 DIAGNOSIS — I5030 Unspecified diastolic (congestive) heart failure: Secondary | ICD-10-CM | POA: Insufficient documentation

## 2014-08-25 DIAGNOSIS — Z9889 Other specified postprocedural states: Secondary | ICD-10-CM | POA: Diagnosis not present

## 2014-08-25 DIAGNOSIS — E039 Hypothyroidism, unspecified: Secondary | ICD-10-CM | POA: Diagnosis not present

## 2014-08-25 DIAGNOSIS — M79673 Pain in unspecified foot: Secondary | ICD-10-CM

## 2014-08-25 DIAGNOSIS — R2241 Localized swelling, mass and lump, right lower limb: Secondary | ICD-10-CM | POA: Diagnosis present

## 2014-08-25 DIAGNOSIS — N183 Chronic kidney disease, stage 3 (moderate): Secondary | ICD-10-CM | POA: Insufficient documentation

## 2014-08-25 DIAGNOSIS — Z85828 Personal history of other malignant neoplasm of skin: Secondary | ICD-10-CM | POA: Insufficient documentation

## 2014-08-25 DIAGNOSIS — I10 Essential (primary) hypertension: Secondary | ICD-10-CM | POA: Diagnosis not present

## 2014-08-25 DIAGNOSIS — M79661 Pain in right lower leg: Secondary | ICD-10-CM | POA: Diagnosis not present

## 2014-08-25 DIAGNOSIS — M1990 Unspecified osteoarthritis, unspecified site: Secondary | ICD-10-CM | POA: Insufficient documentation

## 2014-08-25 DIAGNOSIS — M1711 Unilateral primary osteoarthritis, right knee: Secondary | ICD-10-CM | POA: Diagnosis not present

## 2014-08-25 DIAGNOSIS — M7731 Calcaneal spur, right foot: Secondary | ICD-10-CM | POA: Diagnosis not present

## 2014-08-25 DIAGNOSIS — R609 Edema, unspecified: Secondary | ICD-10-CM | POA: Diagnosis not present

## 2014-08-25 LAB — COMPREHENSIVE METABOLIC PANEL
ALBUMIN: 3.2 g/dL — AB (ref 3.5–5.2)
ALK PHOS: 51 U/L (ref 39–117)
ALT: 15 U/L (ref 0–35)
ANION GAP: 15 (ref 5–15)
AST: 16 U/L (ref 0–37)
BUN: 62 mg/dL — ABNORMAL HIGH (ref 6–23)
CHLORIDE: 99 meq/L (ref 96–112)
CO2: 30 mEq/L (ref 19–32)
Calcium: 9.5 mg/dL (ref 8.4–10.5)
Creatinine, Ser: 2.2 mg/dL — ABNORMAL HIGH (ref 0.50–1.10)
GFR calc Af Amer: 24 mL/min — ABNORMAL LOW (ref >90)
GFR calc non Af Amer: 21 mL/min — ABNORMAL LOW (ref >90)
Glucose, Bld: 74 mg/dL (ref 70–99)
Potassium: 3.4 mEq/L — ABNORMAL LOW (ref 3.7–5.3)
Sodium: 144 mEq/L (ref 137–147)
Total Bilirubin: <0.2 mg/dL — ABNORMAL LOW (ref 0.3–1.2)
Total Protein: 6.6 g/dL (ref 6.0–8.3)

## 2014-08-25 LAB — CBC WITH DIFFERENTIAL/PLATELET
BASOS ABS: 0 10*3/uL (ref 0.0–0.1)
BASOS PCT: 0 % (ref 0–1)
EOS ABS: 0.1 10*3/uL (ref 0.0–0.7)
Eosinophils Relative: 1 % (ref 0–5)
HCT: 33.6 % — ABNORMAL LOW (ref 36.0–46.0)
Hemoglobin: 10.3 g/dL — ABNORMAL LOW (ref 12.0–15.0)
Lymphocytes Relative: 17 % (ref 12–46)
Lymphs Abs: 2.2 10*3/uL (ref 0.7–4.0)
MCH: 29.9 pg (ref 26.0–34.0)
MCHC: 30.7 g/dL (ref 30.0–36.0)
MCV: 97.4 fL (ref 78.0–100.0)
Monocytes Absolute: 1 10*3/uL (ref 0.1–1.0)
Monocytes Relative: 7 % (ref 3–12)
NEUTROS ABS: 9.6 10*3/uL — AB (ref 1.7–7.7)
NEUTROS PCT: 75 % (ref 43–77)
PLATELETS: 280 10*3/uL (ref 150–400)
RBC: 3.45 MIL/uL — ABNORMAL LOW (ref 3.87–5.11)
RDW: 14.4 % (ref 11.5–15.5)
WBC: 12.9 10*3/uL — ABNORMAL HIGH (ref 4.0–10.5)

## 2014-08-25 LAB — URIC ACID: Uric Acid, Serum: 12.8 mg/dL — ABNORMAL HIGH (ref 2.4–7.0)

## 2014-08-25 MED ORDER — HYDROCODONE-ACETAMINOPHEN 5-325 MG PO TABS: 1.0000 | ORAL_TABLET | Freq: Four times a day (QID) | ORAL | Status: DC | PRN

## 2014-08-25 MED ORDER — HYDROCODONE-ACETAMINOPHEN 5-325 MG PO TABS
2.0000 | ORAL_TABLET | Freq: Once | ORAL | Status: AC
  Administered 2014-08-25: 2 via ORAL
  Filled 2014-08-25: qty 2

## 2014-08-25 MED ORDER — GABAPENTIN 100 MG PO CAPS: 100.0000 mg | ORAL_CAPSULE | Freq: Three times a day (TID) | ORAL | Status: DC

## 2014-08-25 NOTE — ED Notes (Signed)
Pt brought in by [facility] for right foot pain and swelling. Pt has had changes in lasix over the past week. Pt took tramadol last night with minimal relief.  Reports pain worse this morning.

## 2014-08-25 NOTE — Discharge Instructions (Signed)
Neurontin as prescribed. Hydrocodone as prescribed as needed for pain.  Followup with your primary Dr. next week to be rechecked, and return to the ER if you develop any other new and concerning symptoms.   Pain of Unknown Etiology (Pain Without a Known Cause) You have come to your caregiver because of pain. Pain can occur in any part of the body. Often there is not a definite cause. If your laboratory (blood or urine) work was normal and X-rays or other studies were normal, your caregiver may treat you without knowing the cause of the pain. An example of this is the headache. Most headaches are diagnosed by taking a history. This means your caregiver asks you questions about your headaches. Your caregiver determines a treatment based on your answers. Usually testing done for headaches is normal. Often testing is not done unless there is no response to medications. Regardless of where your pain is located today, you can be given medications to make you comfortable. If no physical cause of pain can be found, most cases of pain will gradually leave as suddenly as they came.  If you have a painful condition and no reason can be found for the pain, it is important that you follow up with your caregiver. If the pain becomes worse or does not go away, it may be necessary to repeat tests and look further for a possible cause.  Only take over-the-counter or prescription medicines for pain, discomfort, or fever as directed by your caregiver.  For the protection of your privacy, test results cannot be given over the phone. Make sure you receive the results of your test. Ask how these results are to be obtained if you have not been informed. It is your responsibility to obtain your test results.  You may continue all activities unless the activities cause more pain. When the pain lessens, it is important to gradually resume normal activities. Resume activities by beginning slowly and gradually increasing the  intensity and duration of the activities or exercise. During periods of severe pain, bed rest may be helpful. Lie or sit in any position that is comfortable.  Ice used for acute (sudden) conditions may be effective. Use a large plastic bag filled with ice and wrapped in a towel. This may provide pain relief.  See your caregiver for continued problems. Your caregiver can help or refer you for exercises or physical therapy if necessary. If you were given medications for your condition, do not drive, operate machinery or power tools, or sign legal documents for 24 hours. Do not drink alcohol, take sleeping pills, or take other medications that may interfere with treatment. See your caregiver immediately if you have pain that is becoming worse and not relieved by medications. Document Released: 07/16/2001 Document Revised: 08/11/2013 Document Reviewed: 10/21/2005 [facility] Patient Information 2015 [place], [place]. This information is not intended to replace advice given to you by your health care provider. Make sure you discuss any questions you have with your health care provider.

## 2014-08-25 NOTE — ED Notes (Signed)
Pt. Unable to bear weight on R foot due to pain in foot and R ankle.  Pt. Unable to use walker with walking any distance and has no ability to use crutches due to weakness in upper body and no coordination with crutch walking.  Pt. Reports pain in her foot with just touching the floor with the R foot.   Pt. Reports it is okay to use a wheel chair in the home and she is able to push the wheel chair with her hands.

## 2014-08-25 NOTE — ED Provider Notes (Signed)
CSN: [id number]     Arrival date & time 08/25/14  1348 History   First MD Initiated Contact with Patient 08/25/14 1401     Chief Complaint  Patient presents with  . Leg Swelling     (Consider location/radiation/quality/duration/timing/severity/associated sxs/prior Treatment) HPI Comments: Patient is a 73 year old female with history of IgA nephropathy, prednisone-induced diabetes, hypertension. She presents today with complaints of severe pain in her right foot that has occurred in the absence of any injury or trauma. She feels as though the foot is swollen. She denies any fever or chills. She denies any chest pain or shortness of breath.  The history is provided by the patient.    Past Medical History  Diagnosis Date  . Asthma   . Hypertension   . GERD (gastroesophageal reflux disease)   . Allergy   . Hypothyroid   . Osteoporosis   . Gastric ulcer   . Hiatal hernia   . Hyperlipidemia     "don't take RX for it" (11/08/2013)  . Renal artery stenosis     with stent placement  . Lung mass     superior segment LLL/notes 11/08/2013  . Pneumonia     "I've had it ?3 times" (11/08/2013)  . Arthritis     "knees, hips, back, hands" (11/08/2013)  . DJD (degenerative joint disease)   . Lung nodule/ adenoca > LLobectomy 12/15/13  11/08/2013    PET 11/23/2013 1. Dominant sub solid left lower lobe nodule does not demonstrate significantly increased metabolic activity. However, morphologically, adenocarcinoma is still a concern.   - Spirometry 11/24/13 wnl  - 11/24/2013 T surg eval rec> LLower lobectomy 12/15/2013 for adenoca   . CAD (coronary artery disease), non obstructive by cardiac cath 12/12/13 01/24/2014  . Basal cell carcinoma     "cut them off face & right shoulder" (11/08/2013)  . Diastolic CHF   . CKD (chronic kidney disease), stage III   . Nephropathy    Past Surgical History  Procedure Laterality Date  . Knee arthroscopy Bilateral   . Anterior cervical decomp/discectomy fusion  2000     C5-C6  . Renal artery stent Right 11/08/2013    Archie Endo 11/08/2013  . Tonsillectomy and adenoidectomy  ?1946  . Cholecystectomy  1970's  . Vaginal hysterectomy  1970  . Dilation and curettage of uterus  1960's  . Skin cancer excision    . Coronary angiogram      Hx: of 2/ 2015  . Cardiac catheterization      Hx: of 2/ 2015  . Video bronchoscopy N/A 12/15/2013    Procedure: VIDEO BRONCHOSCOPY;  Surgeon: Grace Isaac, MD;  Location: [facility] OR;  Service: Thoracic;  Laterality: N/A;  . Video assisted thoracoscopy (vats)/wedge resection Left 12/15/2013    Procedure: VIDEO ASSISTED THORACOSCOPY (VATS)/WEDGE RESECTION;  Surgeon: Grace Isaac, MD;  Location: [place];  Service: Thoracic;  Laterality: Left;  . Lobectomy Left 12/15/2013    Procedure: LOBECTOMY;  Surgeon: Grace Isaac, MD;  Location: [facility] OR;  Service: Thoracic;  Laterality: Left;   Family History  Problem Relation Age of Onset  . Breast cancer Sister   . Emphysema Father     smoked  . Allergies Sister   . Allergies Brother   . Heart disease Mother   . Heart disease Father   . Diabetes Father   . Diabetes Brother   . Stroke Father   . Heart failure Neg Hx    History  Substance Use Topics  .  Smoking status: Never Smoker   . Smokeless tobacco: Never Used  . Alcohol Use: No   OB History   Grav Para Term Preterm Abortions TAB SAB Ect Mult Living                 Review of Systems  All other systems reviewed and are negative.     Allergies  Benazepril hcl; Loratadine; Celecoxib; Lisinopril; Allegra; and Sulfa antibiotics  Home Medications   Prior to Admission medications   Medication Sig Start Date End Date Taking? Authorizing Provider  albuterol (PROVENTIL HFA;VENTOLIN HFA) 108 (90 BASE) MCG/ACT inhaler Inhale 1-2 puffs into the lungs every 6 (six) hours as needed for wheezing or shortness of breath. 12/31/13   Grace Isaac, MD  Blood Glucose Monitoring Suppl (ONE TOUCH ULTRA 2) W/DEVICE KIT 1 Device by  Does not apply route once. 04/01/14   Renato Shin, MD  Calcium Carb-Cholecalciferol 600-800 MG-UNIT TABS Take 1 tablet by mouth daily.    Historical Provider, MD  cetirizine (ZYRTEC) 10 MG tablet Take 10 mg by mouth at bedtime.     Historical Provider, MD  DULERA 100-5 MCG/ACT AERO Inhale 2 puffs into the lungs 2 (two) times daily as needed for shortness of breath.  04/13/14   Historical Provider, MD  famotidine (PEPCID) 20 MG tablet Take 20 mg by mouth 2 (two) times daily.    Historical Provider, MD  furosemide (LASIX) 40 MG tablet 105m in the am and 422min the pm until weight of 219 is reached. 06/15/14   Shanker M Kristeen MansMD  glucose blood (ONE TOUCH ULTRA TEST) test strip Pt to test 3-4x daily due to labile sugars Dx. 25161.07/21/14   KaMidge MiniumMD  hydrALAZINE (APRESOLINE) 50 MG tablet Take 50 mg by mouth daily.    Historical Provider, MD  Lancets ([facility] HospitalLTRASOFT) lancets Use 1 per day dx code 249.00 04/19/14   AjElayne SnareMD  levothyroxine (SYNTHROID, LEVOTHROID) 88 MCG tablet Take 88 mcg by mouth daily before breakfast.    Historical Provider, MD  nebivolol (BYSTOLIC) 10 MG tablet Take 2 tablets (20 mg total) by mouth daily. 08/23/14   KaMidge MiniumMD  ON[facility] Surgery [facility] IncELICA LANCETS 3360AISC Use to check blood sugar once daily as instructed. Dx code: 790.29 04/19/14   SeRenato ShinMD  polyethylene glycol powder (GLYCOLAX/MIRALAX) powder Take 17 g by mouth daily. 06/09/14   KaMidge MiniumMD  predniSONE (DELTASONE) 50 MG tablet Take 20 mg by mouth daily with breakfast.     Historical Provider, MD  repaglinide (PRANDIN) 0.5 MG tablet Take 1 tablet (0.5 mg total) by mouth 3 (three) times daily before meals. 07/01/14   SeRenato ShinMD   There were no vitals taken for this visit. Physical Exam  Nursing note and vitals reviewed. Constitutional: She is oriented to person, place, and time. She appears well-developed and well-nourished. No distress.  HENT:  Head: Normocephalic and  atraumatic.  Neck: Normal range of motion. Neck supple.  Cardiovascular: Normal rate and regular rhythm.  Exam reveals no gallop and no friction rub.   No murmur heard. Pulmonary/Chest: Effort normal and breath sounds normal. No respiratory distress. She has no wheezes.  Abdominal: Soft. Bowel sounds are normal. She exhibits no distension. There is no tenderness.  Musculoskeletal: Normal range of motion.  There is tenderness to palpation of the right foot to the dorsal and plantar surfaces. There is no obvious abnormality and appears otherwise symmetrical and grossly normal.  Dorsalis pedis pulses are easily palpable bilaterally.  There is some tenderness to the right ankle and right calf.  Neurological: She is alert and oriented to person, place, and time.  Skin: Skin is warm and dry. She is not diaphoretic.    ED Course  Procedures (including critical care time) Labs Review Labs Reviewed  CBC WITH DIFFERENTIAL  COMPREHENSIVE METABOLIC PANEL    Imaging Review US Venous Img Lower Unilateral Right  08/25/2014   CLINICAL DATA:  LEG SWELLING, foot and calf pain. Varicose/ spider veins, CHF, diabetes, history of lung carcinoma.  EXAM: RIGHT LOWER EXTREMITY VENOUS DUPLEX ULTRASOUND  TECHNIQUE: Gray-scale sonography with compression, as well as color and duplex ultrasound, were performed to evaluate the deep venous system from the level of the common femoral vein through the popliteal and proximal calf veins.  COMPARISON:  None  FINDINGS: Normal compressibility of the common femoral, superficial femoral, and popliteal veins, as well as the proximal calf veins. No filling defects to suggest DVT on grayscale or color Doppler imaging. Doppler waveforms show normal direction of venous flow, normal respiratory phasicity and response to augmentation.  Visualized segments of the saphenous venous system normal in caliber and compressibility.  IMPRESSION: 1.  No evidence of lower extremity deep vein  thrombosis,RIGHT.   Electronically Signed   By: Arne Cleveland M.D.   On: 08/25/2014 14:42     EKG Interpretation None      MDM   Final diagnoses:  Foot pain    Patient presents with complaints of severe pain in her right foot in the absence of any injury or trauma. She has an extensive past medical history. Physical examination reveals no significant abnormalities. There is no deformity, warmth, swelling and there is no evidence for vascular compromise as her pulses are easily palpable and symmetrical. X-rays reveal no bony abnormality and ultrasound reveals no evidence for DVT. She was given pain medication but continues to complain of severe pain.  I discussed the findings of the workup with Dr. [name] who is the patient's primary care physician. We have decided to start the patient on Neurontin and pain medication and she is to followup in the office next week. There was some difficulty with ambulation, however she was able to stand and walk to the bathroom using a walker without significant difficulty. She has one of these at home and I will also write a prescription for a wheelchair to assist her.    Veryl Speak, MD 08/26/14 [phone]

## 2014-08-25 NOTE — ED Notes (Signed)
Pt. In radiology at present time have been in to give Pt. meds several times and to draw labs and Pt. Not in room.  Pt. Husband to let RN know when she returns.

## 2014-08-30 ENCOUNTER — Encounter: Payer: Self-pay | Admitting: Family Medicine

## 2014-08-30 ENCOUNTER — Ambulatory Visit (INDEPENDENT_AMBULATORY_CARE_PROVIDER_SITE_OTHER): Payer: Medicare Other | Admitting: Family Medicine

## 2014-08-30 VITALS — BP 140/84 | HR 64 | Temp 98.1°F | Resp 16 | Wt 224.5 lb

## 2014-08-30 DIAGNOSIS — M1030 Gout due to renal impairment, unspecified site: Secondary | ICD-10-CM | POA: Insufficient documentation

## 2014-08-30 DIAGNOSIS — I2510 Atherosclerotic heart disease of native coronary artery without angina pectoris: Secondary | ICD-10-CM | POA: Diagnosis not present

## 2014-08-30 DIAGNOSIS — M10379 Gout due to renal impairment, unspecified ankle and foot: Secondary | ICD-10-CM | POA: Diagnosis not present

## 2014-08-30 MED ORDER — NEBIVOLOL HCL 20 MG PO TABS: 1.0000 | ORAL_TABLET | Freq: Every day | ORAL | Status: DC

## 2014-08-30 NOTE — Patient Instructions (Signed)
Follow up as needed Continue the Allopurinol and Colchicine until instructed by Dr Lorrene Reid Edema may be tight and uncomfortable, gout is pain! Restart physical therapy Call with any questions or concerns Hang in there!!!

## 2014-08-30 NOTE — Assessment & Plan Note (Signed)
New to provider.  Pt is feeling much better since starting Allopurinol and colchicine per renal.  Reviewed that edema is tight but gout is painful.  Pt expressed understanding.  Cleared to resume PT.

## 2014-08-30 NOTE — Progress Notes (Signed)
Pre visit review using our clinic review tool, if applicable. No additional management support is needed unless otherwise documented below in the visit note. 

## 2014-08-30 NOTE — Progress Notes (Signed)
   Subjective:    Patient ID: Susan Pittman, female    DOB: 1941-03-25, 73 y.o.   MRN: [id number]  HPI ER f/u- pt went to ER on Thursday 10/22 due to severe foot/leg pain.  Pt 'could not stand up'.  Pt has high dose diuretics and renal insufficiency which puts her at high risk for gout.  Was found to have very high uric acid level.  Renal started pt on Allopurinol and colchicine.  Pt reports she is currently feeling much better.  Wants to know how she will know the difference between edema and gout.   Review of Systems For ROS see HPI     Objective:   Physical Exam  Vitals reviewed. Constitutional: She is oriented to person, place, and time. She appears well-developed and well-nourished. No distress.  HENT:  Head: Normocephalic and atraumatic.  Musculoskeletal: She exhibits edema (1-2+ edema of feet and LEs bilaterally).  Neurological: She is alert and oriented to person, place, and time.  Skin: Skin is warm and dry. No erythema.  Psychiatric: She has a normal mood and affect. Her behavior is normal.          Assessment & Plan:

## 2014-09-01 ENCOUNTER — Ambulatory Visit (INDEPENDENT_AMBULATORY_CARE_PROVIDER_SITE_OTHER): Payer: Medicare Other | Admitting: Cardiothoracic Surgery

## 2014-09-01 ENCOUNTER — Encounter: Payer: Self-pay | Admitting: Cardiothoracic Surgery

## 2014-09-01 VITALS — BP 173/85 | HR 56 | Resp 16 | Ht 65.0 in | Wt 222.5 lb

## 2014-09-01 DIAGNOSIS — C3432 Malignant neoplasm of lower lobe, left bronchus or lung: Secondary | ICD-10-CM

## 2014-09-01 DIAGNOSIS — Z902 Acquired absence of lung [part of]: Secondary | ICD-10-CM

## 2014-09-01 DIAGNOSIS — I2510 Atherosclerotic heart disease of native coronary artery without angina pectoris: Secondary | ICD-10-CM

## 2014-09-01 DIAGNOSIS — Z9889 Other specified postprocedural states: Secondary | ICD-10-CM

## 2014-09-01 NOTE — Progress Notes (Signed)
[address]       [place] [address]             [phone]                       [facility] L [facility] Medical Record #[id number] Date of Birth: January 21, 1941  Referring PI:RJJO, Christena Deem, MD Primary Cardiology:Dr Gwenlyn Found Primary Care:Katherine [name], MD  Chief Complaint:   PostOp Follow Up Visit 12/15/2013  OPERATIVE REPORT  PREOPERATIVE DIAGNOSIS: Left lower lobe lung mass.  POSTOPERATIVE DIAGNOSIS: Left lower lobe lung mass. Adenocarcinoma by  frozen section.  PROCEDURE PERFORMED: Bronchoscopy, left video-assisted thoracoscopy,  with wedge resection for biopsy of left lower lobe lesion, completion  left lower lobectomy, lymph node dissection, placement of On-Q device.  SURGEON: Lanelle Bal, MD   Lung cancer, left lower lobe   Primary site: Lung (Left)   Staging method: AJCC 7th Edition   Pathologic: Stage IA (T1a, N0, cM0) signed by Grace Isaac, MD on 12/17/2013  8:37 AM   Summary: Stage IA (T1a, N0, cM0)  History of Present Illness:     Patient returns to the office today after left lower lobectomy for stage I adenocarcinoma the lung. Feels shaky and nervious on steroids. The patient has become increasing limited by her renal disease. She developed severe gout which has made it difficult to walk. She remains on chronic steroid therapy.   History  Smoking status  . Never Smoker   Smokeless tobacco  . Never Used       Allergies  Allergen Reactions  . Benazepril Hcl Anaphylaxis and Cough  . Loratadine Anaphylaxis    anaphylaxis  . Celecoxib     REACTION: aching  . Lisinopril     REACTION: cough  . Allegra [Fexofenadine Hcl]     Severe back pain  . Sulfa Antibiotics Hives    Current Outpatient Prescriptions  Medication Sig Dispense Refill  . albuterol (PROVENTIL HFA;VENTOLIN HFA) 108 (90 BASE) MCG/ACT inhaler Inhale 1-2 puffs into the lungs every 6 (six) hours as needed for wheezing or shortness of breath.  18 g   1  . allopurinol (ZYLOPRIM) 100 MG tablet       . Blood Glucose Monitoring Suppl (ONE TOUCH ULTRA 2) W/DEVICE KIT 1 Device by Does not apply route once.  1 each  0  . Calcium Carb-Cholecalciferol 600-800 MG-UNIT TABS Take 1 tablet by mouth daily.      . cetirizine (ZYRTEC) 10 MG tablet Take 10 mg by mouth at bedtime.       . colchicine 0.6 MG tablet       . DULERA 100-5 MCG/ACT AERO Inhale 2 puffs into the lungs 2 (two) times daily as needed for shortness of breath.       . famotidine (PEPCID) 20 MG tablet Take 20 mg by mouth 2 (two) times daily.      . furosemide (LASIX) 40 MG tablet 71m in the am and 427min the pm until weight of 219 is reached.      . gabapentin (NEURONTIN) 100 MG capsule Take 1 capsule (100 mg total) by mouth 3 (three) times daily.  90 capsule  0  . glucose blood (ONE TOUCH ULTRA TEST) test strip Pt to test 3-4x daily due to labile sugars Dx. 250.02  100 each  12  . hydrALAZINE (APRESOLINE) 50 MG tablet Take 50 mg by mouth daily.      .Marland Kitchen  HYDROcodone-acetaminophen (NORCO) 5-325 MG per tablet Take 1-2 tablets by mouth every 6 (six) hours as needed.  20 tablet  0  . Lancets (ONETOUCH ULTRASOFT) lancets Use 1 per day dx code 249.00  100 each  12  . levothyroxine (SYNTHROID, LEVOTHROID) 88 MCG tablet Take 88 mcg by mouth daily before breakfast.      . Nebivolol HCl 20 MG TABS Take 1 tablet (20 mg total) by mouth daily.  30 tablet  6  . ONETOUCH DELICA LANCETS 03T MISC Use to check blood sugar once daily as instructed. Dx code: 790.29  100 each  2  . polyethylene glycol powder (GLYCOLAX/MIRALAX) powder Take 17 g by mouth daily.  850 g  1  . predniSONE (DELTASONE) 50 MG tablet Take 20 mg by mouth daily with breakfast.       . repaglinide (PRANDIN) 0.5 MG tablet Take 1 tablet (0.5 mg total) by mouth 3 (three) times daily before meals.  90 tablet  11  . traMADol (ULTRAM) 50 MG tablet        No current facility-administered medications for this visit.       Physical Exam: BP  173/85  Pulse 56  Resp 16  Ht 5' 5"  (1.651 m)  Wt 222 lb 8 oz (100.925 kg)  BMI 37.03 kg/m2  SpO2 99%  General appearance: alert and cooperative Neurologic: intact Heart: regular rate and rhythm, S1, S2 normal, no murmur, click, rub or gallop Lungs: diminished breath sounds LLL Abdomen: soft, non-tender; bowel sounds normal; no masses,  no organomegaly Extremities: extremities normal, atraumatic, no cyanosis , Homans sign is negative, patient has 2+ bilateral pedal edema but improved from previous exam  Wound: The larger incision and anterior chest tube site are well-healed. Chest tube site is completely healed the other incisions are totally healed without evidence of infection Patient has no cervical or supraclavicular adenopathy Both ankles are swollen, the patient is walking with a rolling walker   Diagnostic Studies & Laboratory data:       Diagnosis: GMS stain demonstrates abundant microorganisms present within the hyalinized nodules with morphology consistent with Histoplasmosis capsulatum. 1. Lung, wedge biopsy/resection, Left lower lobe - INVASIVE ADENOCARCINOMA SEE COMMENT. - TUMOR INVOLVES SURGICAL MARGIN. - NO LYMPHOVASCULAR INVASION IDENTIFIED. - SEE TUMOR SYNOPTIC TEMPLATE BELOW. 2. Lymph node, biopsy, 8 node - ONE LYMPH NODE, NEGATIVE FOR TUMOR (0/1), SEE COMMENT. 3. Lymph node, biopsy, 11 L - ONE LYMPH NODE, NEGATIVE FOR TUMOR (0/1). 4. Lung, resection (segmental or lobe), Left lower - BENIGN LUNG, SEE COMMENT. - NEGATIVE FOR ATYPIA OR MALIGNANCY. - BRONCHOVASCULAR MARGIN, NEGATIVE FOR ATYPIA OR MALIGNANCY. 5. Lymph node, biopsy, 10 node - ONE LYMPH NODE, NEGATIVE FOR TUMOR (0/1). 6. Lymph node, biopsy, 12 node - ONE LYMPH NODE, NEGATIVE FOR TUMOR (0/1). Microscopic Comment 1. LUNG 1 of 3 FINAL for SARITA, HAKANSON ([id number]) Microscopic Comment(continued) Specimen, including laterality: Left lower lobe (parts 1 and 4). Procedure: Wedge resection and  lobectomy Specimen integrity (intact/disrupted): Intact Tumor site: Subpleural Maximum tumor size (cm): 1.7 cm Histologic type: Adenocarcinoma (acinar and lepidic subtypes) Grade: I/well differentiated Margins: Present at margins, see comment. Distance to closest margin (cm): Present at margin Visceral pleura invasion: Absent Tumor extension: Tumor is confined to subpleural parenchyma Treatment effect (if treated with neoadjuvant therapy): None Lymph -Vascular invasion: Absent Lymph nodes: Number examined - 4; Number N1 nodes positive - 0; Number N2 nodes positive - 0 TNM code: pT1a, pN0, pMX Ancillary studies: Can be performed upon request  Non-neoplastic lung: See part 4. Comments: The final surgical margin is part 4. 2. Sections of lymph node demonstrate multiple foci of extensively hyalinized and calcified intranodal nodules. AFB, PAS and GMS stains are pending and will be reported in an addendum. 4. There is no tumor grossly identified. Representative sections of the lung demonstrate non-neoplastic findings to include minimal chronic interstitial inflammation with rare epithelioid granuloma, anthracotic pigment deposition, minimal peribronchiolar chronic inflammation, Langerhans cell histiocytic nodules, and an incidental 3 mm subpleural minute pulmonary meningothelial-like nodule (chemodectoma). The chemodectoma is an incidental, benign finding of no prognostic significance. There are no features of epithelial dysplasia or malignancy present. (CR:kh 12-16-13) Mali RUND DO    Recent Radiology Findings Dg Tibia/fibula Right  08/25/2014   CLINICAL DATA:  Foot pain and swelling starting this morning. Bilateral ankle pain and swelling. Difficulty standing. Diabetes.  EXAM: RIGHT TIBIA AND FIBULA - 2 VIEW  COMPARISON:  None.  FINDINGS: Medial compartmental loss of articular space and marginal spurring in the knee. Mild patellar spurring.  No acute bony abnormality of the tibia or fibula  noted. Plantar calcaneal spur.  IMPRESSION: 1. Osteoarthritis of the knee.  No acute bony findings.   Electronically Signed   By: Sherryl Barters M.D.   On: 08/25/2014 15:05   Ct Chest Wo Contrast  08/19/2014   CLINICAL DATA:  Left lower lobe lung cancer diagnosed 1/15. Surgery only. Restaging.Lung cancer, main bronchus, left C34.02 (ICD-10-CM) Malignant neoplasm of lower lobe of left lung C34.32 (ICD-10-CM)  EXAM: CT CHEST WITHOUT CONTRAST  TECHNIQUE: Multidetector CT imaging of the chest was performed following the standard protocol without IV contrast.  COMPARISON:  06/13/2014  FINDINGS: Lungs/Pleura: Nodularity along the non dependent trachea, including on images 7 through 11. Similar in configuration to on the prior exam, and back to 11/05/2013  Surgical changes of left lower lobectomy.  Mild centrilobular emphysema. 2 mm right apical lung nodule on image 9 is unchanged.  Central superior segment right lower lobe ground-glass opacity on image 28 of series 5. This measures 2.7 cm and is favored to represent an area of volume loss/ atelectasis. This is new.  Subpleural posterior right upper lobe 2 mm nodule is unchanged on image 18.  Left-sided pleural thickening is similar with resolution of trace fluid.  Heart/Mediastinum: No supraclavicular adenopathy. Aortic and branch vessel atherosclerosis. Mild cardiomegaly. No pericardial effusion. No mediastinal or definite hilar adenopathy, given limitations of unenhanced CT. Small hiatal hernia. Left hemidiaphragm elevation.  Upper Abdomen: Cholecystectomy. Normal imaged portions of the liver, spleen, pancreas, adrenal glands. Cholecystectomy. Too small to characterize upper pole left renal lesion, most likely a cyst. This measures approximately 8 mm.  Bones/Musculoskeletal:  No acute osseous abnormality.  IMPRESSION: 1. Status post left lower lobectomy, without evidence of recurrent or metastatic disease. 2. Persistent left pleural thickening. 3. Similar  indeterminate pulmonary nodules. 4. Nodularity along the non dependent trachea. Given presence over prior exams, suspicious for tracheal polyps. Recommend attention on follow-up. 5. A new area of ground-glass opacity within the central right lower lobe. Favored to represent subsegmental atelectasis. Recommend attention on follow-up.   Electronically Signed   By: Abigail Miyamoto M.D.   On: 08/19/2014 13:18   Ct Chest Wo Contrast  02/17/2014   CLINICAL DATA:  Lung infiltrates. History of lung carcinoma in January 2015 with a left lower lobectomy. Short of breath.  EXAM: CT CHEST WITHOUT CONTRAST  TECHNIQUE: Multidetector CT imaging of the chest was performed following the standard protocol without IV  contrast.  COMPARISON:  Current chest radiograph.  Chest CT, 11/05/2013  FINDINGS: There are small right and minimal left pleural effusions. There is consolidation in coarse reticular opacity in the remaining left upper lobe in the posterior lung base. This may all be atelectasis. Pneumonia should be considered likely in the proper clinical setting.  There are 2 small stable nodular densities in the right lung, most likely benign granulomas, scarring or a combination. Mild stable scarring is noted in the anterior medial right lung base. This subsegmental atelectasis at the posterior right lung base. No pulmonary edema. No discrete left lung nodules are seen. There is volume loss on the left from the left lower lobectomy.  There is new mild mediastinal adenopathy. A 1 cm short axis node lies in the precarinal region where it had measures 6.5 mm. There are several prevascular nodes that are prominent. There is a 7 mm node just superior to the main pulmonary artery. This measured 3 mm previously. There is a 9.4 mm right supraclavicular node which previously measured 1 mm smaller. There is and 6 mm left supraclavicular another which measured 4 mm previously.  Heart is normal in size and configuration.  Limited evaluation of  the upper abdomen shows node with a or adrenal masses.  The bony thorax is demineralized. There are degenerative changes along the visualized spine. No osteoblastic or osteolytic lesions.  ri IMPRESSION: 1. There is focal opacity with associated linear and coarse reticular opacity in the posterior lower aspect of the remaining left lung. Although this could be atelectasis, pneumonia is suspected. There is a minimal associated left pleural effusion. 2. There are prominent mediastinal lymph nodes which have increased from the prior study. This could reflect metastatic disease. The nodes may be reactive to the lower lung zone pneumonia. There are prominent supraclavicular nodes that have increased in size as well. No other evidence suggesting metastatic disease. 3. Smallght effusion.  No evidence of a right lung infiltrate. 4. To small right lung nodules are without significant change in most likely benign.   Electronically Signed   By: Lajean Manes M.D.    Recent Labs: Lab Results  Component Value Date   WBC 12.9* 08/25/2014   HGB 10.3* 08/25/2014   HCT 33.6* 08/25/2014   PLT 280 08/25/2014   GLUCOSE 74 08/25/2014   CHOL 262* 03/18/2014   TRIG 307.0* 03/18/2014   HDL 67.40 03/18/2014   LDLDIRECT 126.3 09/10/2012   LDLCALC 133* 03/18/2014   ALT 15 08/25/2014   AST 16 08/25/2014   NA 144 08/25/2014   K 3.4* 08/25/2014   CL 99 08/25/2014   CREATININE 2.20* 08/25/2014   BUN 62* 08/25/2014   CO2 30 08/25/2014   TSH 0.374 06/14/2014   INR 1.01 02/25/2014   HGBA1C 7.4* 05/27/2014    Assessment / Plan:    renal bx done currently on steroids for IGA GLOMERULONEPHRITIS WITH FOCAL MILD ACTIVITY Plan to see her back again in 6 months for followup of her stage I carcinoma of the lung/resected- follow-up CT scans show no evidence of recurrence Patient has become increasingly physically limited due to gout in her feet and her chronic steroid therapy.   Grace Isaac MD      [address] [place] [address] Office [phone]   Beeper [phone]

## 2014-09-07 ENCOUNTER — Ambulatory Visit: Payer: Medicare Other | Attending: Internal Medicine | Admitting: Physical Therapy

## 2014-09-07 ENCOUNTER — Other Ambulatory Visit: Payer: Self-pay | Admitting: Dermatology

## 2014-09-07 DIAGNOSIS — R262 Difficulty in walking, not elsewhere classified: Secondary | ICD-10-CM | POA: Diagnosis not present

## 2014-09-07 DIAGNOSIS — Z902 Acquired absence of lung [part of]: Secondary | ICD-10-CM | POA: Insufficient documentation

## 2014-09-07 DIAGNOSIS — D2271 Melanocytic nevi of right lower limb, including hip: Secondary | ICD-10-CM | POA: Diagnosis not present

## 2014-09-07 DIAGNOSIS — D692 Other nonthrombocytopenic purpura: Secondary | ICD-10-CM | POA: Diagnosis not present

## 2014-09-07 DIAGNOSIS — I509 Heart failure, unspecified: Secondary | ICD-10-CM | POA: Diagnosis not present

## 2014-09-07 DIAGNOSIS — Z85118 Personal history of other malignant neoplasm of bronchus and lung: Secondary | ICD-10-CM | POA: Insufficient documentation

## 2014-09-07 DIAGNOSIS — D239 Other benign neoplasm of skin, unspecified: Secondary | ICD-10-CM | POA: Diagnosis not present

## 2014-09-07 DIAGNOSIS — M6281 Muscle weakness (generalized): Secondary | ICD-10-CM | POA: Diagnosis not present

## 2014-09-07 DIAGNOSIS — I10 Essential (primary) hypertension: Secondary | ICD-10-CM | POA: Diagnosis not present

## 2014-09-07 DIAGNOSIS — D485 Neoplasm of uncertain behavior of skin: Secondary | ICD-10-CM | POA: Diagnosis not present

## 2014-09-07 DIAGNOSIS — Z85828 Personal history of other malignant neoplasm of skin: Secondary | ICD-10-CM | POA: Diagnosis not present

## 2014-09-07 DIAGNOSIS — Z808 Family history of malignant neoplasm of other organs or systems: Secondary | ICD-10-CM | POA: Diagnosis not present

## 2014-09-13 ENCOUNTER — Ambulatory Visit: Payer: Medicare Other | Admitting: Physical Therapy

## 2014-09-13 ENCOUNTER — Telehealth: Payer: Self-pay | Admitting: General Practice

## 2014-09-13 DIAGNOSIS — Z902 Acquired absence of lung [part of]: Secondary | ICD-10-CM | POA: Diagnosis not present

## 2014-09-13 DIAGNOSIS — I509 Heart failure, unspecified: Secondary | ICD-10-CM | POA: Diagnosis not present

## 2014-09-13 DIAGNOSIS — M6281 Muscle weakness (generalized): Secondary | ICD-10-CM | POA: Diagnosis not present

## 2014-09-13 DIAGNOSIS — I10 Essential (primary) hypertension: Secondary | ICD-10-CM | POA: Diagnosis not present

## 2014-09-13 DIAGNOSIS — Z85118 Personal history of other malignant neoplasm of bronchus and lung: Secondary | ICD-10-CM | POA: Diagnosis not present

## 2014-09-13 DIAGNOSIS — R262 Difficulty in walking, not elsewhere classified: Secondary | ICD-10-CM | POA: Diagnosis not present

## 2014-09-13 NOTE — Telephone Encounter (Signed)
Agree w/ advice given.  Will see tomorrow in office.

## 2014-09-13 NOTE — Telephone Encounter (Signed)
Pt was sen tin to our office today from Physical therapy. Pt states that her BP was 160/90 before therapy and continually rose (t was completing leg lifts). Pt's BP was checked in office and was 150/88. Pt had been sitting 15 minutes. Spoke with Dr. Charlett Blake and advised that pt denied SOB, dizziness, HA, or chest pain. Per Charlett Blake pt was scheduled with [facility] for tomorrow and advised to limit sodium intake and increase water intake until appt. Pt also advised to check her BP levels tonight, tomorrow morning, and tomorrow before her appt.   Pt also advised that she needs to seek ER if she has any chest pains, SOB, HA, or if BP is elevated beyond the 160/90 and rest does not bring it down.

## 2014-09-14 ENCOUNTER — Ambulatory Visit: Payer: Medicare Other | Admitting: Rehabilitation

## 2014-09-14 ENCOUNTER — Ambulatory Visit (INDEPENDENT_AMBULATORY_CARE_PROVIDER_SITE_OTHER): Payer: Medicare Other | Admitting: Family Medicine

## 2014-09-14 ENCOUNTER — Encounter: Payer: Self-pay | Admitting: Family Medicine

## 2014-09-14 VITALS — BP 142/88 | HR 65 | Temp 98.1°F | Resp 16 | Wt 221.1 lb

## 2014-09-14 DIAGNOSIS — Z902 Acquired absence of lung [part of]: Secondary | ICD-10-CM | POA: Diagnosis not present

## 2014-09-14 DIAGNOSIS — I2510 Atherosclerotic heart disease of native coronary artery without angina pectoris: Secondary | ICD-10-CM | POA: Diagnosis not present

## 2014-09-14 DIAGNOSIS — I10 Essential (primary) hypertension: Secondary | ICD-10-CM | POA: Diagnosis not present

## 2014-09-14 DIAGNOSIS — R262 Difficulty in walking, not elsewhere classified: Secondary | ICD-10-CM | POA: Diagnosis not present

## 2014-09-14 DIAGNOSIS — M6281 Muscle weakness (generalized): Secondary | ICD-10-CM | POA: Diagnosis not present

## 2014-09-14 DIAGNOSIS — I509 Heart failure, unspecified: Secondary | ICD-10-CM | POA: Diagnosis not present

## 2014-09-14 DIAGNOSIS — Z85118 Personal history of other malignant neoplasm of bronchus and lung: Secondary | ICD-10-CM | POA: Diagnosis not present

## 2014-09-14 MED ORDER — HYDRALAZINE HCL 50 MG PO TABS: ORAL_TABLET | ORAL | Status: DC

## 2014-09-14 NOTE — Assessment & Plan Note (Signed)
Deteriorated.  Suspect that pt's hydralazine is not lasting 24 hrs and when med wears off she is having BP spikes.  Increase to BID dosing.  If BP doesn't respond, will have pt see cards to assess for re-stenosis of renal artery stent.  Pt expressed understanding and is in agreement w/ plan.

## 2014-09-14 NOTE — Progress Notes (Signed)
Pre visit review using our clinic review tool, if applicable. No additional management support is needed unless otherwise documented below in the visit note. 

## 2014-09-14 NOTE — Progress Notes (Signed)
   Subjective:    Patient ID: Susan Pittman, female    DOB: 07-Oct-1941, 73 y.o.   MRN: [id number]  HPI HTN- chronic problem, BP has been elevated recently.  Sent to office from PT yesterday after BP was elevated at start of session and did not improve.  Home BPs ranging from 138-177/79-95.  Currently on Bystolic 20mg  and Hydralazine 50mg  daily but did increase to twice daily yesterday.  No CP, SOB above baseline.  Some HAs at night but none x2 days.  No blurry/double vision.   Review of Systems For ROS see HPI     Objective:   Physical Exam  Constitutional: She is oriented to person, place, and time. She appears well-developed and well-nourished. No distress.  HENT:  Head: Normocephalic and atraumatic.  Neck: Normal range of motion. Neck supple. No thyromegaly present.  Cardiovascular: Normal rate, regular rhythm, normal heart sounds and intact distal pulses.   Pulmonary/Chest: Effort normal and breath sounds normal. No respiratory distress. She has no wheezes. She has no rales.  Musculoskeletal: She exhibits edema (1+ bilateral edema).  Lymphadenopathy:    She has no cervical adenopathy.  Neurological: She is alert and oriented to person, place, and time.  Skin: Skin is warm and dry.  Psychiatric: She has a normal mood and affect. Her behavior is normal. Thought content normal.  Vitals reviewed.         Assessment & Plan:

## 2014-09-14 NOTE — Patient Instructions (Signed)
Follow up as scheduled Increase the Hydralazine to twice daily Continue to monitor BP at home- call if consistently high Keep up the good work!  You look great! Hang in there!!!

## 2014-09-19 ENCOUNTER — Ambulatory Visit: Payer: Medicare Other | Admitting: Physical Therapy

## 2014-09-19 DIAGNOSIS — M6281 Muscle weakness (generalized): Secondary | ICD-10-CM | POA: Diagnosis not present

## 2014-09-19 DIAGNOSIS — Z85118 Personal history of other malignant neoplasm of bronchus and lung: Secondary | ICD-10-CM | POA: Diagnosis not present

## 2014-09-19 DIAGNOSIS — I509 Heart failure, unspecified: Secondary | ICD-10-CM | POA: Diagnosis not present

## 2014-09-19 DIAGNOSIS — I10 Essential (primary) hypertension: Secondary | ICD-10-CM | POA: Diagnosis not present

## 2014-09-19 DIAGNOSIS — R262 Difficulty in walking, not elsewhere classified: Secondary | ICD-10-CM | POA: Diagnosis not present

## 2014-09-19 DIAGNOSIS — Z902 Acquired absence of lung [part of]: Secondary | ICD-10-CM | POA: Diagnosis not present

## 2014-09-21 ENCOUNTER — Ambulatory Visit (INDEPENDENT_AMBULATORY_CARE_PROVIDER_SITE_OTHER): Payer: Medicare Other | Admitting: Family Medicine

## 2014-09-21 ENCOUNTER — Encounter: Payer: Self-pay | Admitting: Family Medicine

## 2014-09-21 ENCOUNTER — Ambulatory Visit: Payer: Medicare Other | Admitting: Physical Therapy

## 2014-09-21 VITALS — BP 142/78 | HR 66 | Temp 98.1°F | Resp 16 | Wt 221.1 lb

## 2014-09-21 DIAGNOSIS — I2510 Atherosclerotic heart disease of native coronary artery without angina pectoris: Secondary | ICD-10-CM

## 2014-09-21 DIAGNOSIS — I10 Essential (primary) hypertension: Secondary | ICD-10-CM | POA: Diagnosis not present

## 2014-09-21 DIAGNOSIS — R262 Difficulty in walking, not elsewhere classified: Secondary | ICD-10-CM | POA: Diagnosis not present

## 2014-09-21 DIAGNOSIS — R251 Tremor, unspecified: Secondary | ICD-10-CM | POA: Insufficient documentation

## 2014-09-21 DIAGNOSIS — Z902 Acquired absence of lung [part of]: Secondary | ICD-10-CM | POA: Diagnosis not present

## 2014-09-21 DIAGNOSIS — I509 Heart failure, unspecified: Secondary | ICD-10-CM | POA: Diagnosis not present

## 2014-09-21 DIAGNOSIS — M6281 Muscle weakness (generalized): Secondary | ICD-10-CM | POA: Diagnosis not present

## 2014-09-21 DIAGNOSIS — Z85118 Personal history of other malignant neoplasm of bronchus and lung: Secondary | ICD-10-CM | POA: Diagnosis not present

## 2014-09-21 NOTE — Progress Notes (Signed)
   Subjective:    Patient ID: Susan Pittman, female    DOB: 1941/04/30, 73 y.o.   MRN: [id number]  HPI HTN- chronic problem, last visit we increased hydralazine to twice daily.  BP is much improved today.  Denies CP, SOB above baseline, edema above baseline.  Tremors- pt asking what time she should take her diabetes medication b/c she is having episodes of feeling 'really shaky'.  Reports her arms will just 'tremble'.  Not checking sugars during these episodes.  CBGs have been running low 100s when checking them.  Denies dizziness.   Review of Systems For ROS see HPI     Objective:   Physical Exam  Constitutional: She is oriented to person, place, and time. She appears well-developed and well-nourished. No distress.  HENT:  Head: Normocephalic and atraumatic.  Eyes: Conjunctivae and EOM are normal. Pupils are equal, round, and reactive to light.  Neck: Normal range of motion. Neck supple. No thyromegaly present.  Cardiovascular: Normal rate, regular rhythm, normal heart sounds and intact distal pulses.   No murmur heard. Pulmonary/Chest: Effort normal and breath sounds normal. No respiratory distress.  Abdominal: Soft. She exhibits no distension. There is no tenderness.  Musculoskeletal: She exhibits edema (1+ LE edema bilaterally).  Lymphadenopathy:    She has no cervical adenopathy.  Neurological: She is alert and oriented to person, place, and time. Coordination (no tremor seen) normal.  Skin: Skin is warm and dry.  Psychiatric: She has a normal mood and affect. Her behavior is normal.  Vitals reviewed.         Assessment & Plan:

## 2014-09-21 NOTE — Progress Notes (Signed)
Pre visit review using our clinic review tool, if applicable. No additional management support is needed unless otherwise documented below in the visit note. 

## 2014-09-21 NOTE — Assessment & Plan Note (Signed)
New.  Unclear if this is related to hypoglycemia as pt is not checking CBGs during episodes.  Instructed her to take 1/2 prandin TID and monitor for symptom improvement.  Pt to also check CBGs regularly and if consistently >150, she is to increase back to 1 tab.  Also discussed that she can take 1/2 tab in AM and noon and whole tab prior to dinner.  If no improvement, pt to call Endo.  Pt expressed understanding and is in agreement w/ plan.

## 2014-09-21 NOTE — Assessment & Plan Note (Signed)
BP has improved since changing hydralazine to BID.  Currently asymptomatic.  Will continue to monitor.

## 2014-09-21 NOTE — Patient Instructions (Signed)
Follow up as needed Continue the Hydralazine twice daily Decrease the Prandin to 1/2 tab and see if tremors improve When having tremors, please check sugars If no improvement, please call Endo Call with any questions or concerns Happy Thanksgiving!!!

## 2014-09-23 ENCOUNTER — Ambulatory Visit: Payer: Medicare Other | Admitting: Physical Therapy

## 2014-09-23 DIAGNOSIS — N028 Recurrent and persistent hematuria with other morphologic changes: Secondary | ICD-10-CM | POA: Diagnosis not present

## 2014-09-23 DIAGNOSIS — I701 Atherosclerosis of renal artery: Secondary | ICD-10-CM | POA: Diagnosis not present

## 2014-09-23 DIAGNOSIS — M109 Gout, unspecified: Secondary | ICD-10-CM | POA: Diagnosis not present

## 2014-09-23 DIAGNOSIS — D649 Anemia, unspecified: Secondary | ICD-10-CM | POA: Diagnosis not present

## 2014-09-23 DIAGNOSIS — I129 Hypertensive chronic kidney disease with stage 1 through stage 4 chronic kidney disease, or unspecified chronic kidney disease: Secondary | ICD-10-CM | POA: Diagnosis not present

## 2014-09-26 ENCOUNTER — Other Ambulatory Visit: Payer: Self-pay | Admitting: General Practice

## 2014-09-26 ENCOUNTER — Ambulatory Visit: Payer: Medicare Other | Admitting: Rehabilitation

## 2014-09-26 MED ORDER — LEVOTHYROXINE SODIUM 88 MCG PO TABS: 88.0000 ug | ORAL_TABLET | Freq: Every day | ORAL | Status: DC

## 2014-09-27 ENCOUNTER — Ambulatory Visit: Payer: Medicare Other | Admitting: Physical Therapy

## 2014-09-28 ENCOUNTER — Ambulatory Visit: Payer: Medicare Other | Admitting: Rehabilitation

## 2014-10-03 ENCOUNTER — Ambulatory Visit: Payer: Medicare Other | Admitting: Physical Therapy

## 2014-10-03 ENCOUNTER — Ambulatory Visit (INDEPENDENT_AMBULATORY_CARE_PROVIDER_SITE_OTHER): Payer: Medicare Other | Admitting: Endocrinology

## 2014-10-03 ENCOUNTER — Encounter: Payer: Self-pay | Admitting: Endocrinology

## 2014-10-03 VITALS — BP 132/84 | HR 74 | Temp 98.2°F | Ht 65.0 in | Wt 224.0 lb

## 2014-10-03 DIAGNOSIS — E119 Type 2 diabetes mellitus without complications: Secondary | ICD-10-CM | POA: Diagnosis not present

## 2014-10-03 DIAGNOSIS — I2510 Atherosclerotic heart disease of native coronary artery without angina pectoris: Secondary | ICD-10-CM

## 2014-10-03 LAB — HEMOGLOBIN A1C: Hgb A1c MFr Bld: 6.3 % (ref 4.6–6.5)

## 2014-10-03 NOTE — Patient Instructions (Addendum)
check your blood sugar once a day.  vary the time of day when you check, between before the 3 meals, and at bedtime.  also check if you have symptoms of your blood sugar being too high or too low.  please keep a record of the readings and bring it to your next appointment here.  You can write it on any piece of paper.  please call us sooner if your blood sugar goes below 70, or if you have a lot of readings over 200.   Please come back for a follow-up appointment in 3 months.   blood tests are being requested for you today.  We'll let you know about the results.   good diet and exercise habits significanly improve the control of your diabetes.  please let me know if you wish to be referred to a dietician.

## 2014-10-03 NOTE — Progress Notes (Signed)
Subjective:    Patient ID: Susan Pittman, female    DOB: 05-12-1941, 73 y.o.   MRN: [id number]  HPI  Pt returns for f/u of diabetes mellitus: DM type: 2 Dx'ed: 2015, after she was started on prednisone for IgA nephropathy Complications:  Therapy: insulin since GDM: never DKA: never Severe hypoglycemia: never Pancreatitis: never Other: prednisone is being tapered Interval history: As the prednisone was decreased, he requirement for the Prandin also decreased.  She says she misses taking it approx 1-2 times per week.  Dr [name] decreased it.  she brings a record of her cbg's which i have reviewed today.   It varies from 86-110 in am.   Past Medical History  Diagnosis Date  . Asthma   . Hypertension   . GERD (gastroesophageal reflux disease)   . Allergy   . Hypothyroid   . Osteoporosis   . Gastric ulcer   . Hiatal hernia   . Hyperlipidemia     "don't take RX for it" (11/08/2013)  . Renal artery stenosis     with stent placement  . Lung mass     superior segment LLL/notes 11/08/2013  . Pneumonia     "I've had it ?3 times" (11/08/2013)  . Arthritis     "knees, hips, back, hands" (11/08/2013)  . DJD (degenerative joint disease)   . Lung nodule/ adenoca > LLobectomy 12/15/13  11/08/2013    PET 11/23/2013 1. Dominant sub solid left lower lobe nodule does not demonstrate significantly increased metabolic activity. However, morphologically, adenocarcinoma is still a concern.   - Spirometry 11/24/13 wnl  - 11/24/2013 T surg eval rec> LLower lobectomy 12/15/2013 for adenoca   . CAD (coronary artery disease), non obstructive by cardiac cath 12/12/13 01/24/2014  . Basal cell carcinoma     "cut them off face & right shoulder" (11/08/2013)  . Diastolic CHF   . CKD (chronic kidney disease), stage III   . Nephropathy     Past Surgical History  Procedure Laterality Date  . Knee arthroscopy Bilateral   . Anterior cervical decomp/discectomy fusion  2000    C5-C6  . Renal artery stent Right 11/08/2013   Archie Endo 11/08/2013  . Tonsillectomy and adenoidectomy  ?1946  . Cholecystectomy  1970's  . Vaginal hysterectomy  1970  . Dilation and curettage of uterus  1960's  . Skin cancer excision    . Coronary angiogram      Hx: of 2/ 2015  . Cardiac catheterization      Hx: of 2/ 2015  . Video bronchoscopy N/A 12/15/2013    Procedure: VIDEO BRONCHOSCOPY;  Surgeon: Grace Isaac, MD;  Location: [facility] OR;  Service: Thoracic;  Laterality: N/A;  . Video assisted thoracoscopy (vats)/wedge resection Left 12/15/2013    Procedure: VIDEO ASSISTED THORACOSCOPY (VATS)/WEDGE RESECTION;  Surgeon: Grace Isaac, MD;  Location: [place];  Service: Thoracic;  Laterality: Left;  . Lobectomy Left 12/15/2013    Procedure: LOBECTOMY;  Surgeon: Grace Isaac, MD;  Location: [place];  Service: Thoracic;  Laterality: Left;    History   Social History  . Marital Status: Married    Spouse Name: N/A    Number of Children: N/A  . Years of Education: N/A   Occupational History  . Retired    Social History Main Topics  . Smoking status: Never Smoker   . Smokeless tobacco: Never Used  . Alcohol Use: No  . Drug Use: No  . Sexual Activity: Yes   Other Topics  Concern  . Not on file   Social History Narrative    Current Outpatient Prescriptions on File Prior to Visit  Medication Sig Dispense Refill  . albuterol (PROVENTIL HFA;VENTOLIN HFA) 108 (90 BASE) MCG/ACT inhaler Inhale 1-2 puffs into the lungs every 6 (six) hours as needed for wheezing or shortness of breath. 18 g 1  . allopurinol (ZYLOPRIM) 100 MG tablet     . Blood Glucose Monitoring Suppl (ONE TOUCH ULTRA 2) W/DEVICE KIT 1 Device by Does not apply route once. 1 each 0  . Calcium Carb-Cholecalciferol 600-800 MG-UNIT TABS Take 1 tablet by mouth daily.    . cetirizine (ZYRTEC) 10 MG tablet Take 10 mg by mouth at bedtime.     . colchicine 0.6 MG tablet     . DULERA 100-5 MCG/ACT AERO Inhale 2 puffs into the lungs 2 (two) times daily as needed for  shortness of breath.     . famotidine (PEPCID) 20 MG tablet Take 20 mg by mouth 2 (two) times daily.    . furosemide (LASIX) 40 MG tablet 56m in the am and 41min the pm until weight of 219 is reached.    . gabapentin (NEURONTIN) 100 MG capsule Take 1 capsule (100 mg total) by mouth 3 (three) times daily. 90 capsule 0  . glucose blood (ONE TOUCH ULTRA TEST) test strip Pt to test 3-4x daily due to labile sugars Dx. 250.02 100 each 12  . hydrALAZINE (APRESOLINE) 50 MG tablet 1 tab twice daily 60 tablet 3  . HYDROcodone-acetaminophen (NORCO) 5-325 MG per tablet Take 1-2 tablets by mouth every 6 (six) hours as needed. 20 tablet 0  . Lancets (ONETOUCH ULTRASOFT) lancets Use 1 per day dx code 249.00 100 each 12  . levothyroxine (SYNTHROID, LEVOTHROID) 88 MCG tablet Take 1 tablet (88 mcg total) by mouth daily before breakfast. 90 tablet 1  . Nebivolol HCl 20 MG TABS Take 1 tablet (20 mg total) by mouth daily. 30 tablet 6  . ONETOUCH DELICA LANCETS 3360YISC Use to check blood sugar once daily as instructed. Dx code: 790.29 100 each 2  . polyethylene glycol powder (GLYCOLAX/MIRALAX) powder Take 17 g by mouth daily. 850 g 1  . predniSONE (DELTASONE) 50 MG tablet Take 10 mg by mouth daily with breakfast.     . repaglinide (PRANDIN) 0.5 MG tablet Take 1 tablet (0.5 mg total) by mouth 3 (three) times daily before meals. 90 tablet 11  . traMADol (ULTRAM) 50 MG tablet      No current facility-administered medications on file prior to visit.    Allergies  Allergen Reactions  . Benazepril Hcl Anaphylaxis and Cough  . Loratadine Anaphylaxis    anaphylaxis  . Celecoxib     REACTION: aching  . Lisinopril     REACTION: cough  . Allegra [Fexofenadine Hcl]     Severe back pain  . Sulfa Antibiotics Hives    Family History  Problem Relation Age of Onset  . Breast cancer Sister   . Emphysema Father     smoked  . Allergies Sister   . Allergies Brother   . Heart disease Mother   . Heart disease Father    . Diabetes Father   . Diabetes Brother   . Stroke Father   . Heart failure Neg Hx     BP 132/84 mmHg  Pulse 74  Temp(Src) 98.2 F (36.8 C) (Oral)  Ht 5' 5"  (1.651 m)  Wt 224 lb (101.606 kg)  BMI 37.28  kg/m2  SpO2 96%   Review of Systems She has gained a few lbs.  Since the prandin was decreased, .she denies hypoglycemia.     Objective:   Physical Exam VITAL SIGNS:  See vs page GENERAL: no distress Pulses: dorsalis pedis intact bilat.   Feet: no deformity.  1+ bilat leg edema Skin:  no ulcer on the feet.  normal color and temp. Neuro: sensation is intact to touch on the feet   Lab Results  Component Value Date   HGBA1C 6.3 10/03/2014       Assessment & Plan:  DM: well-controlled Weight gain: she is advised to re-lose IgA nephropathy, apparently improved.  With the reduction of her prednisone, she may be able to d/c prandin altogether.   Patient is advised the following: Patient Instructions  check your blood sugar once a day.  vary the time of day when you check, between before the 3 meals, and at bedtime.  also check if you have symptoms of your blood sugar being too high or too low.  please keep a record of the readings and bring it to your next appointment here.  You can write it on any piece of paper.  please call us sooner if your blood sugar goes below 70, or if you have a lot of readings over 200.   Please come back for a follow-up appointment in 3 months.   blood tests are being requested for you today.  We'll let you know about the results.   good diet and exercise habits significanly improve the control of your diabetes.  please let me know if you wish to be referred to a dietician.

## 2014-10-05 ENCOUNTER — Ambulatory Visit: Payer: Medicare Other | Admitting: Physical Therapy

## 2014-10-06 ENCOUNTER — Telehealth: Payer: Self-pay | Admitting: Family Medicine

## 2014-10-06 DIAGNOSIS — R5381 Other malaise: Secondary | ICD-10-CM

## 2014-10-06 NOTE — Telephone Encounter (Signed)
Caller name: rodney--advanced Relation to pt: Call back number:  [phone] Pharmacy:  Reason for call:   Needs order for standing wheelchair

## 2014-10-06 NOTE — Telephone Encounter (Signed)
Ok to write order? Please verify diagnosis.

## 2014-10-06 NOTE — Telephone Encounter (Signed)
I don't know what a standing wheelchair is- do they mean some sort of modified walker.  Ok to write order.  Dx- physical deconditioning R53.81

## 2014-10-06 NOTE — Telephone Encounter (Signed)
DME written for a standard wheelchair and faxed to [facility] [phone]

## 2014-10-07 ENCOUNTER — Ambulatory Visit: Payer: Medicare Other | Admitting: Rehabilitation

## 2014-10-10 ENCOUNTER — Ambulatory Visit: Payer: Medicare Other | Admitting: Rehabilitation

## 2014-10-11 ENCOUNTER — Encounter: Payer: Self-pay | Admitting: Cardiovascular Disease

## 2014-10-11 ENCOUNTER — Ambulatory Visit (INDEPENDENT_AMBULATORY_CARE_PROVIDER_SITE_OTHER): Payer: Medicare Other | Admitting: Cardiovascular Disease

## 2014-10-11 VITALS — BP 118/72 | HR 62 | Ht 65.0 in | Wt 224.0 lb

## 2014-10-11 DIAGNOSIS — I2510 Atherosclerotic heart disease of native coronary artery without angina pectoris: Secondary | ICD-10-CM

## 2014-10-11 DIAGNOSIS — I10 Essential (primary) hypertension: Secondary | ICD-10-CM

## 2014-10-11 DIAGNOSIS — I701 Atherosclerosis of renal artery: Secondary | ICD-10-CM | POA: Diagnosis not present

## 2014-10-11 DIAGNOSIS — I5032 Chronic diastolic (congestive) heart failure: Secondary | ICD-10-CM | POA: Diagnosis not present

## 2014-10-11 NOTE — Assessment & Plan Note (Signed)
Most recent duplex in July showed patent stent with no significant restenosis. Recommend a follow-up duplex evaluation in 6 months from now.

## 2014-10-11 NOTE — Progress Notes (Signed)
HPI  [name] is a 73 year old female who is here for a follow up visit.  She has a long-standing history of hypertension for at least 30-40 years. She initially was treated with diuretic therapy.  She has a history of hypothyroidism, remote peptic ulcer disease in the 1980s, as well as GERD.  As part of her workup for hypertension, she was found to have significant renal artery stenosis.  On 11/08/2013 she underwent stenting of her right renal artery by Dr. Gwenlyn Found after a renal Doppler suggested hemodynamically significant right renal artery stenosis.  She subsequently presented to the hospital with chest pain and on 12/12/2013 underwent cardiac catheterization .  She was found to have mild nonobstructive CAD with smooth 20% RCA narrowing.  Ejection fraction was 65%.   She was found to have lung nodules and on 12/15/2013 underwent VATS and mini thoracotomy with lobectomy of the left lower lobe with lymph node dissection for adenocarcinoma.  She continued to have leg edema and worsening chronic kidney disease. She underwent kidney biopsy which showed evidence of IgA nephropathy. She was started on high-dose prednisone. The dose has been tapered down. Renal artery duplex in July showed patent renal artery stent with no significant restenosis. She continues to have labile hypertension with blood pressure is overall reasonably controlled.   Allergies  Allergen Reactions  . Benazepril Hcl Anaphylaxis and Cough  . Loratadine Anaphylaxis    anaphylaxis  . Celecoxib     REACTION: aching  . Lisinopril     REACTION: cough  . Allegra [Fexofenadine Hcl]     Severe back pain  . Sulfa Antibiotics Hives     Current Outpatient Prescriptions on File Prior to Visit  Medication Sig Dispense Refill  . albuterol (PROVENTIL HFA;VENTOLIN HFA) 108 (90 BASE) MCG/ACT inhaler Inhale 1-2 puffs into the lungs every 6 (six) hours as needed for wheezing or shortness of breath. 18 g 1  . allopurinol  (ZYLOPRIM) 100 MG tablet Take 100 mg by mouth 2 (two) times daily.     . Blood Glucose Monitoring Suppl (ONE TOUCH ULTRA 2) W/DEVICE KIT 1 Device by Does not apply route once. 1 each 0  . Calcium Carb-Cholecalciferol 600-800 MG-UNIT TABS Take 1 tablet by mouth daily.    . cetirizine (ZYRTEC) 10 MG tablet Take 10 mg by mouth at bedtime.     . colchicine 0.6 MG tablet Take 0.6 mg by mouth every other day.     . DULERA 100-5 MCG/ACT AERO Inhale 2 puffs into the lungs 2 (two) times daily as needed for shortness of breath.     . famotidine (PEPCID) 20 MG tablet Take 20 mg by mouth 2 (two) times daily.    . furosemide (LASIX) 40 MG tablet 32m in the am and 481min the pm until weight of 219 is reached.    . glucose blood (ONE TOUCH ULTRA TEST) test strip Pt to test 3-4x daily due to labile sugars Dx. 250.02 100 each 12  . hydrALAZINE (APRESOLINE) 50 MG tablet 1 tab twice daily 60 tablet 3  . HYDROcodone-acetaminophen (NORCO) 5-325 MG per tablet Take 1-2 tablets by mouth every 6 (six) hours as needed. 20 tablet 0  . Lancets (ONETOUCH ULTRASOFT) lancets Use 1 per day dx code 249.00 100 each 12  . levothyroxine (SYNTHROID, LEVOTHROID) 88 MCG tablet Take 1 tablet (88 mcg total) by mouth daily before breakfast. 90 tablet 1  . Nebivolol HCl 20 MG TABS Take 1 tablet (20 mg total)  by mouth daily. 30 tablet 6  . ONETOUCH DELICA LANCETS 80S MISC Use to check blood sugar once daily as instructed. Dx code: 790.29 100 each 2  . polyethylene glycol powder (GLYCOLAX/MIRALAX) powder Take 17 g by mouth daily. 850 g 1  . predniSONE (DELTASONE) 50 MG tablet Take 10 mg by mouth daily with breakfast.     . repaglinide (PRANDIN) 0.5 MG tablet Take 1 tablet (0.5 mg total) by mouth 3 (three) times daily before meals. 90 tablet 11  . traMADol (ULTRAM) 50 MG tablet      No current facility-administered medications on file prior to visit.     Past Medical History  Diagnosis Date  . Asthma   . Hypertension   . GERD  (gastroesophageal reflux disease)   . Allergy   . Hypothyroid   . Osteoporosis   . Gastric ulcer   . Hiatal hernia   . Hyperlipidemia     "don't take RX for it" (11/08/2013)  . Renal artery stenosis     with stent placement  . Lung mass     superior segment LLL/notes 11/08/2013  . Pneumonia     "I've had it ?3 times" (11/08/2013)  . Arthritis     "knees, hips, back, hands" (11/08/2013)  . DJD (degenerative joint disease)   . Lung nodule/ adenoca > LLobectomy 12/15/13  11/08/2013    PET 11/23/2013 1. Dominant sub solid left lower lobe nodule does not demonstrate significantly increased metabolic activity. However, morphologically, adenocarcinoma is still a concern.   - Spirometry 11/24/13 wnl  - 11/24/2013 T surg eval rec> LLower lobectomy 12/15/2013 for adenoca   . CAD (coronary artery disease), non obstructive by cardiac cath 12/12/13 01/24/2014  . Basal cell carcinoma     "cut them off face & right shoulder" (11/08/2013)  . Diastolic CHF   . CKD (chronic kidney disease), stage III   . Nephropathy      Past Surgical History  Procedure Laterality Date  . Knee arthroscopy Bilateral   . Anterior cervical decomp/discectomy fusion  2000    C5-C6  . Renal artery stent Right 11/08/2013    Archie Endo 11/08/2013  . Tonsillectomy and adenoidectomy  ?1946  . Cholecystectomy  1970's  . Vaginal hysterectomy  1970  . Dilation and curettage of uterus  1960's  . Skin cancer excision    . Coronary angiogram      Hx: of 2/ 2015  . Cardiac catheterization      Hx: of 2/ 2015  . Video bronchoscopy N/A 12/15/2013    Procedure: VIDEO BRONCHOSCOPY;  Surgeon: Grace Isaac, MD;  Location: [facility] OR;  Service: Thoracic;  Laterality: N/A;  . Video assisted thoracoscopy (vats)/wedge resection Left 12/15/2013    Procedure: VIDEO ASSISTED THORACOSCOPY (VATS)/WEDGE RESECTION;  Surgeon: Grace Isaac, MD;  Location: [place];  Service: Thoracic;  Laterality: Left;  . Lobectomy Left 12/15/2013    Procedure: LOBECTOMY;  Surgeon:  Grace Isaac, MD;  Location: [facility] OR;  Service: Thoracic;  Laterality: Left;     Family History  Problem Relation Age of Onset  . Breast cancer Sister   . Emphysema Father     smoked  . Allergies Sister   . Allergies Brother   . Heart disease Mother   . Heart disease Father   . Diabetes Father   . Diabetes Brother   . Stroke Father   . Heart failure Neg Hx      History   Social History  .  Marital Status: Married    Spouse Name: N/A    Number of Children: N/A  . Years of Education: N/A   Occupational History  . Retired    Social History Main Topics  . Smoking status: Never Smoker   . Smokeless tobacco: Never Used  . Alcohol Use: No  . Drug Use: No  . Sexual Activity: Yes   Other Topics Concern  . Not on file   Social History Narrative     PHYSICAL EXAM   BP 118/72 mmHg  Pulse 62  Ht 5' 5"  (1.651 m)  Wt 224 lb (101.606 kg)  BMI 37.28 kg/m2 Constitutional: She is oriented to person, place, and time. She appears well-developed and well-nourished. No distress.  HENT: No nasal discharge.  Head: Normocephalic and atraumatic.  Eyes: Pupils are equal and round. No discharge.  Neck: Normal range of motion. Neck supple. No JVD present. No thyromegaly present.  Cardiovascular: Normal rate, regular rhythm, normal heart sounds. Exam reveals no gallop and no friction rub. No murmur heard.  Pulmonary/Chest: Effort normal and breath sounds normal. No stridor. No respiratory distress. She has no wheezes. She has no rales. She exhibits no tenderness.  Abdominal: Soft. Bowel sounds are normal. She exhibits no distension. There is no tenderness. There is no rebound and no guarding.  Musculoskeletal: Normal range of motion. She exhibits mild edema and no tenderness.  Neurological: She is alert and oriented to person, place, and time. Coordination normal.  Skin: Skin is warm and dry. No rash noted. She is not diaphoretic. No erythema. No pallor.  Psychiatric: She has a  normal mood and affect. Her behavior is normal. Judgment and thought content normal.      ASSESSMENT AND PLAN

## 2014-10-11 NOTE — Patient Instructions (Signed)
Your physician has requested that you have a renal artery duplex in 6 MONTHS. During this test, an ultrasound is used to evaluate blood flow to the kidneys. Allow one hour for this exam. Do not eat after midnight the day before and avoid carbonated beverages. Take your medications as you usually do.  Your physician wants you to follow-up in: 6 MONTHS with Dr Fletcher Anon.  You will receive a reminder letter in the mail two months in advance. If you don't receive a letter, please call our office to schedule the follow-up appointment.  Your physician recommends that you continue on your current medications as directed. Please refer to the Current Medication list given to you today.

## 2014-10-11 NOTE — Assessment & Plan Note (Signed)
She appears to be euvolemic on current dose of furosemide.

## 2014-10-11 NOTE — Assessment & Plan Note (Signed)
Blood pressure is reasonably controlled. 

## 2014-10-12 ENCOUNTER — Ambulatory Visit: Payer: Medicare Other | Admitting: Physical Therapy

## 2014-10-13 ENCOUNTER — Encounter (HOSPITAL_COMMUNITY): Payer: Self-pay | Admitting: Cardiovascular Disease

## 2014-10-14 ENCOUNTER — Ambulatory Visit: Payer: Medicare Other | Admitting: Rehabilitation

## 2014-10-17 ENCOUNTER — Ambulatory Visit: Payer: Medicare Other | Admitting: Rehabilitation

## 2014-10-19 ENCOUNTER — Ambulatory Visit: Payer: Medicare Other | Admitting: Physical Therapy

## 2014-10-21 ENCOUNTER — Ambulatory Visit: Payer: Medicare Other | Admitting: Physical Therapy

## 2014-10-21 DIAGNOSIS — I701 Atherosclerosis of renal artery: Secondary | ICD-10-CM | POA: Diagnosis not present

## 2014-10-21 DIAGNOSIS — I129 Hypertensive chronic kidney disease with stage 1 through stage 4 chronic kidney disease, or unspecified chronic kidney disease: Secondary | ICD-10-CM | POA: Diagnosis not present

## 2014-10-21 DIAGNOSIS — D649 Anemia, unspecified: Secondary | ICD-10-CM | POA: Diagnosis not present

## 2014-10-21 DIAGNOSIS — M109 Gout, unspecified: Secondary | ICD-10-CM | POA: Diagnosis not present

## 2014-10-21 DIAGNOSIS — N028 Recurrent and persistent hematuria with other morphologic changes: Secondary | ICD-10-CM | POA: Diagnosis not present

## 2014-11-08 ENCOUNTER — Encounter: Payer: Medicare Other | Admitting: Family Medicine

## 2014-11-09 DIAGNOSIS — I129 Hypertensive chronic kidney disease with stage 1 through stage 4 chronic kidney disease, or unspecified chronic kidney disease: Secondary | ICD-10-CM | POA: Diagnosis not present

## 2014-11-09 DIAGNOSIS — M109 Gout, unspecified: Secondary | ICD-10-CM | POA: Diagnosis not present

## 2014-11-09 DIAGNOSIS — N028 Recurrent and persistent hematuria with other morphologic changes: Secondary | ICD-10-CM | POA: Diagnosis not present

## 2014-11-09 DIAGNOSIS — I701 Atherosclerosis of renal artery: Secondary | ICD-10-CM | POA: Diagnosis not present

## 2014-11-09 DIAGNOSIS — D649 Anemia, unspecified: Secondary | ICD-10-CM | POA: Diagnosis not present

## 2014-11-16 ENCOUNTER — Other Ambulatory Visit (HOSPITAL_COMMUNITY): Payer: Self-pay | Admitting: *Deleted

## 2014-11-17 ENCOUNTER — Ambulatory Visit (HOSPITAL_COMMUNITY)
Admission: RE | Admit: 2014-11-17 | Discharge: 2014-11-17 | Disposition: A | Discharge status: Home / Self Care | Payer: Medicare Other | Source: Ambulatory Visit | Attending: Nephrology | Admitting: Nephrology

## 2014-11-17 DIAGNOSIS — D509 Iron deficiency anemia, unspecified: Secondary | ICD-10-CM | POA: Diagnosis not present

## 2014-11-17 MED ORDER — FERUMOXYTOL INJECTION 510 MG/17 ML
510.0000 mg | Freq: Once | INTRAVENOUS | Status: AC
  Administered 2014-11-17: 510 mg via INTRAVENOUS
  Filled 2014-11-17: qty 17

## 2014-11-17 NOTE — Discharge Instructions (Signed)

## 2014-11-22 ENCOUNTER — Encounter: Payer: Self-pay | Admitting: Family Medicine

## 2014-11-24 ENCOUNTER — Encounter: Payer: Self-pay | Admitting: Family Medicine

## 2014-11-24 ENCOUNTER — Ambulatory Visit (INDEPENDENT_AMBULATORY_CARE_PROVIDER_SITE_OTHER): Payer: Medicare Other | Admitting: Family Medicine

## 2014-11-24 VITALS — BP 130/64 | HR 62 | Temp 97.8°F | Resp 16 | Wt 225.5 lb

## 2014-11-24 DIAGNOSIS — B029 Zoster without complications: Secondary | ICD-10-CM | POA: Diagnosis not present

## 2014-11-24 MED ORDER — VALACYCLOVIR HCL 1 G PO TABS: 1000.0000 mg | ORAL_TABLET | Freq: Three times a day (TID) | ORAL | Status: DC

## 2014-11-24 NOTE — Progress Notes (Signed)
Pre visit review using our clinic review tool, if applicable. No additional management support is needed unless otherwise documented below in the visit note. 

## 2014-11-24 NOTE — Patient Instructions (Signed)
Follow up as needed This is shingles Start the Valtrex 3x/day x7 days Keep areas clean and dry- if they rupture or ooze, apply neosporin Call with any questions or concerns Hang in there!!!

## 2014-11-24 NOTE — Progress Notes (Signed)
   Subjective:    Patient ID: Susan Pittman, female    DOB: 12/18/1940, 74 y.o.   MRN: [id number]  HPI Flank pain- L sided at site of incisions for lobectomy.  sxs started Friday.  Pt reports swelling over L back/flank.  Rash appeared on Tuesday.  Tender to touch.  No pain w/ urination, no increased frequency or urgency.  Pt is tearful and fears lung cancer recurrence.   Review of Systems For ROS see HPI     Objective:   Physical Exam  Constitutional: She appears well-developed and well-nourished. No distress.  Musculoskeletal: She exhibits tenderness (mild TTP over L flank).  Skin: Skin is warm and dry. Rash (vesicular rash in dermatomal distribution over L flank) noted. No erythema.  Vitals reviewed.         Assessment & Plan:

## 2014-11-24 NOTE — Assessment & Plan Note (Signed)
New.  Pt's pain and subsequent rash development consistent w/ shingles.  Start Valtrex.  Reviewed supportive care and red flags that should prompt return.  Pt expressed understanding and is in agreement w/ plan.

## 2014-11-27 ENCOUNTER — Encounter: Payer: Self-pay | Admitting: Family Medicine

## 2014-12-01 ENCOUNTER — Encounter: Payer: Self-pay | Admitting: General Practice

## 2014-12-01 ENCOUNTER — Telehealth: Payer: Self-pay | Admitting: Family Medicine

## 2014-12-01 MED ORDER — HYDROCODONE-ACETAMINOPHEN 5-325 MG PO TABS: 1.0000 | ORAL_TABLET | Freq: Three times a day (TID) | ORAL | Status: DC | PRN

## 2014-12-01 NOTE — Telephone Encounter (Signed)
Caller name: Fleischer,Larry Relation to pt: self  Call back number: [phone] Pharmacy:  Reason for call:  Spouse stated excuse letter written by MD needs to state 12/07/14-12/11/14.please revise spouse will pick up 12/02/14.

## 2014-12-01 NOTE — Telephone Encounter (Signed)
Caller name:Topor, Brei Relation to KD:XIPJ Call back number:[phone] Pharmacy:[facility]  Reason for call: pt saw dr. [name] last Thursday treated for shingles pt states she takes last dosage of her meds, and it was no refills states the meds did not work. States she would take one night and take one morning and between morning and noon she had no relief. Wants to know if she can get something else or if she needs more of what she has now.

## 2014-12-01 NOTE — Telephone Encounter (Signed)
Clarified with patient about her shingles concerns.  She states that she has pain in the morning (left lower back) and that she thought it was relieved by the Valtrex, but understands that this is not what the medication was for.  She thinks the shingles is clearing up, but would like something for pain.   Please advise  She would also like a Dr. note for [employer] stating that she had a shingles infection and was unable to fly.   (1/19-1/28).

## 2014-12-01 NOTE — Telephone Encounter (Signed)
Med filled. Pt notified.  

## 2014-12-01 NOTE — Telephone Encounter (Signed)
[place] for hydrocodone 5/325mg  TID prn for pain, #60, no refills

## 2014-12-01 NOTE — Telephone Encounter (Signed)
The Valtrex was to be taken 3x/day x7 days and it was not to treat pain, it was to shorten the course of the virus.  It doesn't work as an antibiotic.  The only thing we treat at this time is the pain.  Please clarify if that's what she needs.

## 2014-12-02 ENCOUNTER — Encounter: Payer: Self-pay | Admitting: General Practice

## 2014-12-02 NOTE — Telephone Encounter (Signed)
Please advise if ok to write? These are different dates then the previous message.

## 2014-12-02 NOTE — Telephone Encounter (Signed)
Ok to [employer]

## 2014-12-02 NOTE — Telephone Encounter (Signed)
Letter written, pt is just afraid of sitting and being confined to a smaller space with the pain and tenderness of the shingles that she is still having.

## 2014-12-26 DIAGNOSIS — I129 Hypertensive chronic kidney disease with stage 1 through stage 4 chronic kidney disease, or unspecified chronic kidney disease: Secondary | ICD-10-CM | POA: Diagnosis not present

## 2014-12-26 DIAGNOSIS — I701 Atherosclerosis of renal artery: Secondary | ICD-10-CM | POA: Diagnosis not present

## 2014-12-26 DIAGNOSIS — D649 Anemia, unspecified: Secondary | ICD-10-CM | POA: Diagnosis not present

## 2014-12-26 DIAGNOSIS — N028 Recurrent and persistent hematuria with other morphologic changes: Secondary | ICD-10-CM | POA: Diagnosis not present

## 2015-01-03 ENCOUNTER — Encounter: Payer: Self-pay | Admitting: Endocrinology

## 2015-01-03 ENCOUNTER — Ambulatory Visit (INDEPENDENT_AMBULATORY_CARE_PROVIDER_SITE_OTHER): Payer: Medicare Other | Admitting: Endocrinology

## 2015-01-03 VITALS — BP 132/88 | HR 68 | Temp 98.4°F | Ht 65.0 in | Wt 229.0 lb

## 2015-01-03 DIAGNOSIS — E119 Type 2 diabetes mellitus without complications: Secondary | ICD-10-CM | POA: Diagnosis not present

## 2015-01-03 LAB — HEMOGLOBIN A1C: Hgb A1c MFr Bld: 5.8 % (ref 4.6–6.5)

## 2015-01-03 NOTE — Patient Instructions (Addendum)
check your blood sugar 1-2 times per week.  vary the time of day when you check, between before the 3 meals, and at bedtime.  also check if you have symptoms of your blood sugar being too high or too low.  please keep a record of the readings and bring it to your next appointment here.  You can write it on any piece of paper.  please call us sooner if your blood sugar goes below 70, or if you have a lot of readings over 200.   Please come back for a follow-up appointment in 6 months, or sooner if the blood sugar goes up.   blood tests are being requested for you today.  We'll let you know about the results.

## 2015-01-03 NOTE — Progress Notes (Signed)
Subjective:    Patient ID: Susan Pittman, female    DOB: [birth year], 74 y.o.   MRN: [id number]  HPI Pt returns for f/u of diabetes mellitus: DM type: 2 Dx'ed: 2015, after she was started on prednisone for IgA nephropathy.   Complications:  Therapy: repaglinide.  GDM: never DKA: never Severe hypoglycemia: never Pancreatitis: never Other: prednisone is still being tapered Interval history: no cbg record, but states cbg's are approx 100.  She has been off repaglinide x 2 months.   Past Medical History  Diagnosis Date  . Asthma   . Hypertension   . GERD (gastroesophageal reflux disease)   . Allergy   . Hypothyroid   . Osteoporosis   . Gastric ulcer   . Hiatal hernia   . Hyperlipidemia     "don't take RX for it" (11/08/2013)  . Renal artery stenosis     with stent placement  . Lung mass     superior segment LLL/notes 11/08/2013  . Pneumonia     "I've had it ?3 times" (11/08/2013)  . Arthritis     "knees, hips, back, hands" (11/08/2013)  . DJD (degenerative joint disease)   . Lung nodule/ adenoca > LLobectomy 12/15/13  11/08/2013    PET 11/23/2013 1. Dominant sub solid left lower lobe nodule does not demonstrate significantly increased metabolic activity. However, morphologically, adenocarcinoma is still a concern.   - Spirometry 11/24/13 wnl  - 11/24/2013 T surg eval rec> LLower lobectomy 12/15/2013 for adenoca   . CAD (coronary artery disease), non obstructive by cardiac cath 12/12/13 01/24/2014  . Basal cell carcinoma     "cut them off face & right shoulder" (11/08/2013)  . Diastolic CHF   . CKD (chronic kidney disease), stage III   . Nephropathy     Past Surgical History  Procedure Laterality Date  . Knee arthroscopy Bilateral   . Anterior cervical decomp/discectomy fusion  2000    C5-C6  . Renal artery stent Right 11/08/2013    Archie Endo 11/08/2013  . Tonsillectomy and adenoidectomy  ?1946  . Cholecystectomy  1970's  . Vaginal hysterectomy  1970  . Dilation and curettage of uterus   1960's  . Skin cancer excision    . Coronary angiogram      Hx: of 2/ 2015  . Cardiac catheterization      Hx: of 2/ 2015  . Video bronchoscopy N/A 12/15/2013    Procedure: VIDEO BRONCHOSCOPY;  Surgeon: Grace Isaac, MD;  Location: [facility] OR;  Service: Thoracic;  Laterality: N/A;  . Video assisted thoracoscopy (vats)/wedge resection Left 12/15/2013    Procedure: VIDEO ASSISTED THORACOSCOPY (VATS)/WEDGE RESECTION;  Surgeon: Grace Isaac, MD;  Location: [facility];  Service: Thoracic;  Laterality: Left;  . Lobectomy Left 12/15/2013    Procedure: LOBECTOMY;  Surgeon: Grace Isaac, MD;  Location: [facility];  Service: Thoracic;  Laterality: Left;  . Renal angiogram Bilateral 11/08/2013    Procedure: RENAL ANGIOGRAM;  Surgeon: Lorretta Harp, MD;  Location: [facility] CATH LAB;  Service: Cardiovascular;  Laterality: Bilateral;  . Percutaneous stent intervention Right 11/08/2013    Procedure: PERCUTANEOUS STENT INTERVENTION;  Surgeon: Lorretta Harp, MD;  Location: [facility] CATH LAB;  Service: Cardiovascular;  Laterality: Right;  rt renal artery stent  . Left heart catheterization with coronary angiogram N/A 12/13/2013    Procedure: LEFT HEART CATHETERIZATION WITH CORONARY ANGIOGRAM;  Surgeon: Burnell Blanks, MD;  Location: [facility] CATH LAB;  Service: Cardiovascular;  Laterality: N/A;    History  Social History  . Marital Status: Married    Spouse Name: N/A  . Number of Children: N/A  . Years of Education: N/A   Occupational History  . Retired    Social History Main Topics  . Smoking status: Never Smoker   . Smokeless tobacco: Never Used  . Alcohol Use: No  . Drug Use: No  . Sexual Activity: Yes   Other Topics Concern  . Not on file   Social History Narrative    Current Outpatient Prescriptions on File Prior to Visit  Medication Sig Dispense Refill  . albuterol (PROVENTIL HFA;VENTOLIN HFA) 108 (90 BASE) MCG/ACT inhaler Inhale 1-2 puffs into the lungs every 6 (six) hours as needed for  wheezing or shortness of breath. 18 g 1  . allopurinol (ZYLOPRIM) 100 MG tablet Take 100 mg by mouth 2 (two) times daily.     . Blood Glucose Monitoring Suppl (ONE TOUCH ULTRA 2) W/DEVICE KIT 1 Device by Does not apply route once. 1 each 0  . Calcium Carb-Cholecalciferol 600-800 MG-UNIT TABS Take 1 tablet by mouth daily.    . cetirizine (ZYRTEC) 10 MG tablet Take 10 mg by mouth at bedtime.     . colchicine 0.6 MG tablet Take 0.6 mg by mouth every other day.     . DULERA 100-5 MCG/ACT AERO Inhale 2 puffs into the lungs 2 (two) times daily as needed for shortness of breath.     . famotidine (PEPCID) 20 MG tablet Take 20 mg by mouth 2 (two) times daily.    . Ferrous Sulfate (IRON SUPPLEMENT PO) Take 150 mg by mouth 2 (two) times daily.    . furosemide (LASIX) 40 MG tablet 4m in the am and 439min the pm until weight of 219 is reached.    . glucose blood (ONE TOUCH ULTRA TEST) test strip Pt to test 3-4x daily due to labile sugars Dx. 250.02 100 each 12  . hydrALAZINE (APRESOLINE) 50 MG tablet 1 tab twice daily 60 tablet 3  . HYDROcodone-acetaminophen (NORCO) 5-325 MG per tablet Take 1-2 tablets by mouth 3 (three) times daily as needed. 60 tablet 0  . Lancets (ONETOUCH ULTRASOFT) lancets Use 1 per day dx code 249.00 100 each 12  . levothyroxine (SYNTHROID, LEVOTHROID) 88 MCG tablet Take 1 tablet (88 mcg total) by mouth daily before breakfast. 90 tablet 1  . Nebivolol HCl 20 MG TABS Take 1 tablet (20 mg total) by mouth daily. 30 tablet 6  . ONETOUCH DELICA LANCETS 3307EISC Use to check blood sugar once daily as instructed. Dx code: 790.29 100 each 2  . polyethylene glycol powder (GLYCOLAX/MIRALAX) powder Take 17 g by mouth daily. 850 g 1  . traMADol (ULTRAM) 50 MG tablet     . valACYclovir (VALTREX) 1000 MG tablet Take 1 tablet (1,000 mg total) by mouth 3 (three) times daily. 21 tablet 0   No current facility-administered medications on file prior to visit.    Allergies  Allergen Reactions  .  Benazepril Hcl Anaphylaxis and Cough  . Loratadine Anaphylaxis    anaphylaxis  . Celecoxib     REACTION: aching  . Lisinopril     REACTION: cough  . Allegra [Fexofenadine Hcl]     Severe back pain  . Sulfa Antibiotics Hives    Family History  Problem Relation Age of Onset  . Breast cancer Sister   . Emphysema Father     smoked  . Allergies Sister   . Allergies Brother   .  Heart disease Mother   . Heart disease Father   . Diabetes Father   . Diabetes Brother   . Stroke Father   . Heart failure Neg Hx     BP 132/88 mmHg  Pulse 68  Temp(Src) 98.4 F (36.9 C) (Oral)  Ht _0  (1.651 m)  Wt 229 lb (103.874 kg)  BMI 38.11 kg/m2  SpO2 97%  Review of Systems She has gained a few lbs.     Objective:   Physical Exam VITAL SIGNS:  See vs page GENERAL: no distress Pulses: dorsalis pedis intact bilat.   MSK: no deformity of the feet CV: no leg edema.  Few bilat varicosities Skin:  no ulcer on the feet.  normal color and temp on the feet. Neuro: sensation is intact to touch on the feet.    Lab Results  Component Value Date   HGBA1C 5.8 01/03/2015       Assessment & Plan:  DM: better off steroids.  Patient is advised the following: Patient Instructions  check your blood sugar 1-2 times per week.  vary the time of day when you check, between before the 3 meals, and at bedtime.  also check if you have symptoms of your blood sugar being too high or too low.  please keep a record of the readings and bring it to your next appointment here.  You can write it on any piece of paper.  please call us sooner if your blood sugar goes below 70, or if you have a lot of readings over 200.   Please come back for a follow-up appointment in 6 months, or sooner if the blood sugar goes up.   blood tests are being requested for you today.  We'll let you know about the results.

## 2015-01-05 ENCOUNTER — Other Ambulatory Visit: Payer: Self-pay | Admitting: Internal Medicine

## 2015-01-12 ENCOUNTER — Other Ambulatory Visit: Payer: Self-pay | Admitting: General Practice

## 2015-01-12 MED ORDER — FLUTICASONE PROPIONATE 50 MCG/ACT NA SUSP: NASAL | Status: DC

## 2015-01-25 IMAGING — CR DG CHEST 1V PORT
1 series · 1 of 1 positions shown · non-contrast
Comparison: 12/16/2013

CLINICAL DATA: Status post thoracic surgery

EXAM:
PORTABLE CHEST - 1 VIEW

[AP]
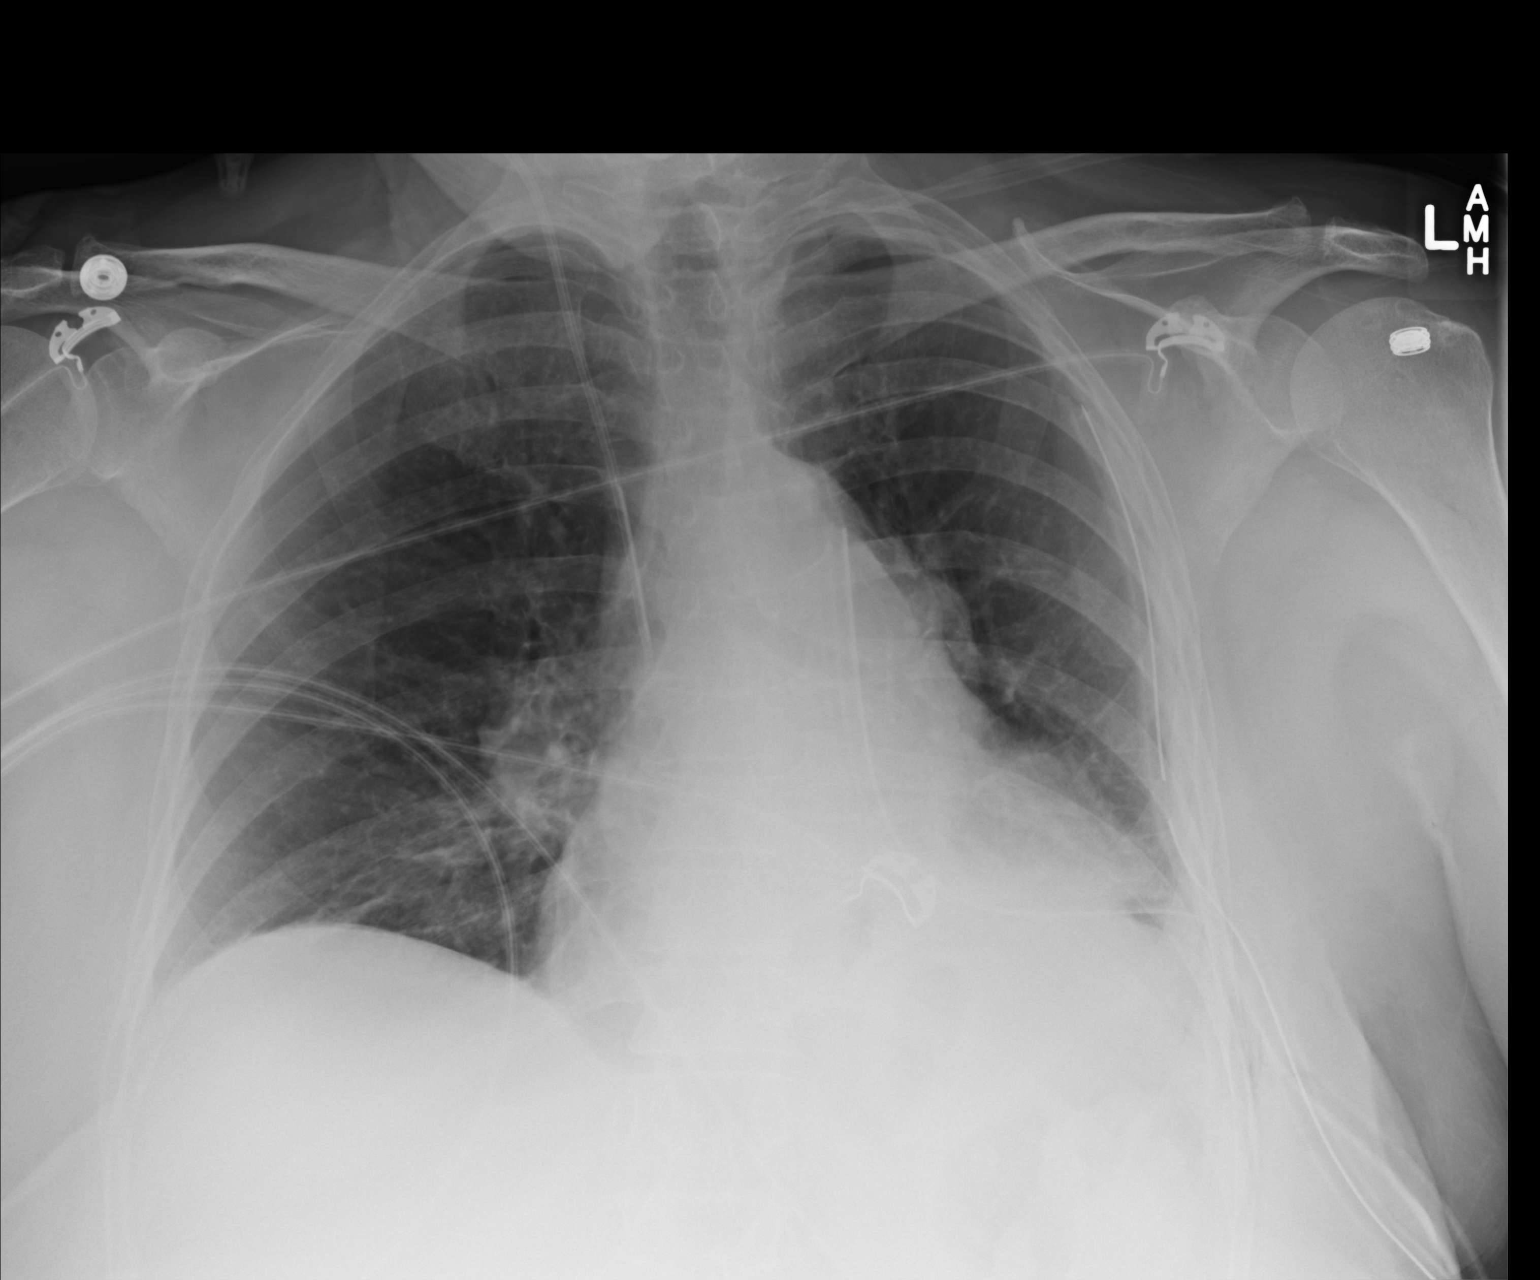

[1 of 1 positions shown; findings below may reference images not displayed]

FINDINGS: Right jugular central line is again identified and stable. Two
thoracostomy catheters are seen on the left and stable in
appearance. No pneumothorax is identified. Left basilar atelectatic
changes are seen. The cardiac shadow is stable.
IMPRESSION: Left basilar atelectasis.  No acute abnormality is noted.

## 2015-02-07 DIAGNOSIS — H2513 Age-related nuclear cataract, bilateral: Secondary | ICD-10-CM | POA: Diagnosis not present

## 2015-02-07 DIAGNOSIS — H524 Presbyopia: Secondary | ICD-10-CM | POA: Diagnosis not present

## 2015-02-07 DIAGNOSIS — H26053 Posterior subcapsular polar infantile and juvenile cataract, bilateral: Secondary | ICD-10-CM | POA: Diagnosis not present

## 2015-02-07 LAB — HM DIABETES EYE EXAM

## 2015-02-07 IMAGING — CR DG CHEST 2V
2 series · 2 of 2 positions shown · non-contrast
Comparison: Chest radiographs dated 12/28/2013. PET-CT dated
11/23/2013.

CLINICAL DATA: Cough, low-grade fever, Mild dyspnea.  Lung cancer.

EXAM:
CHEST  2 VIEW

[w chest pa]
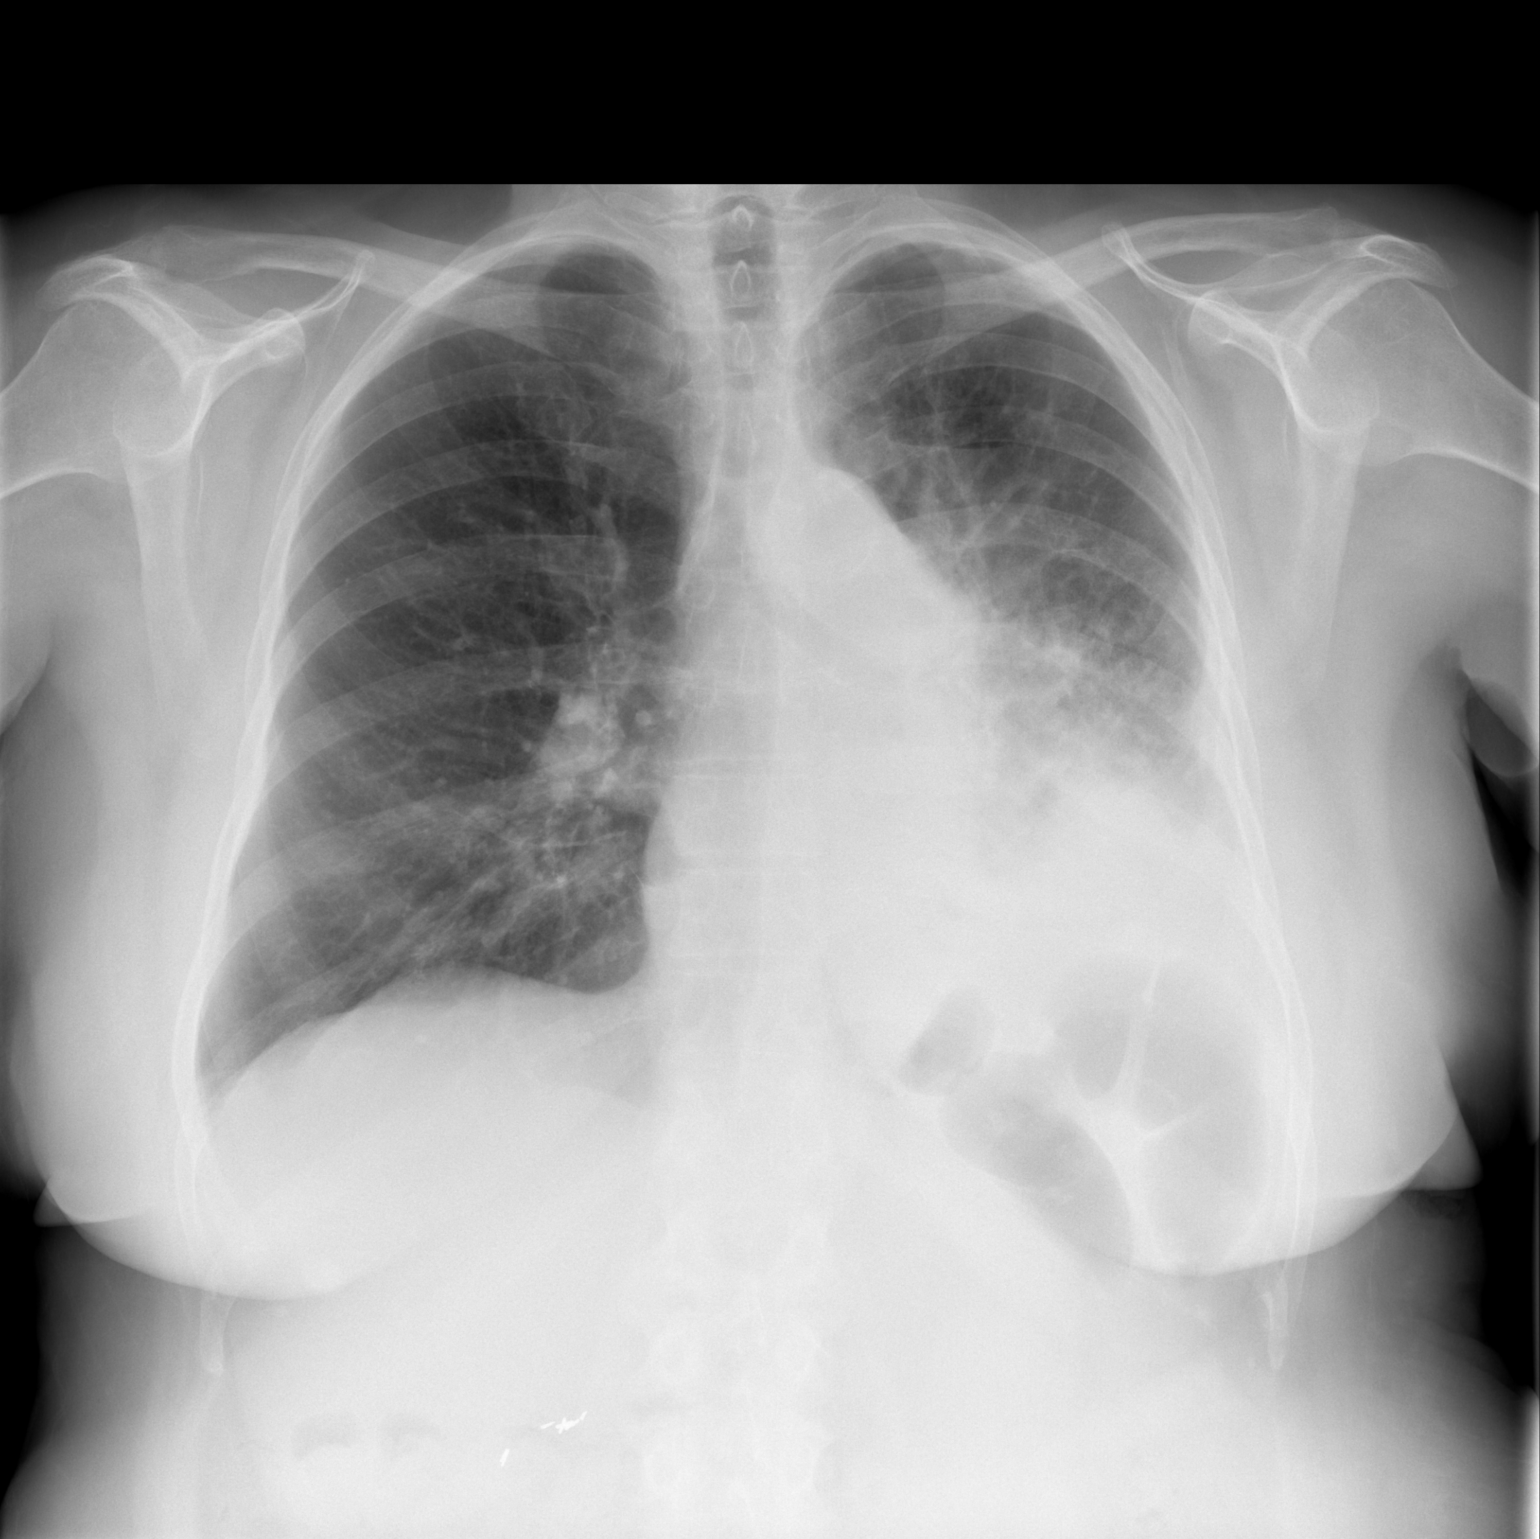

[w chest lat]
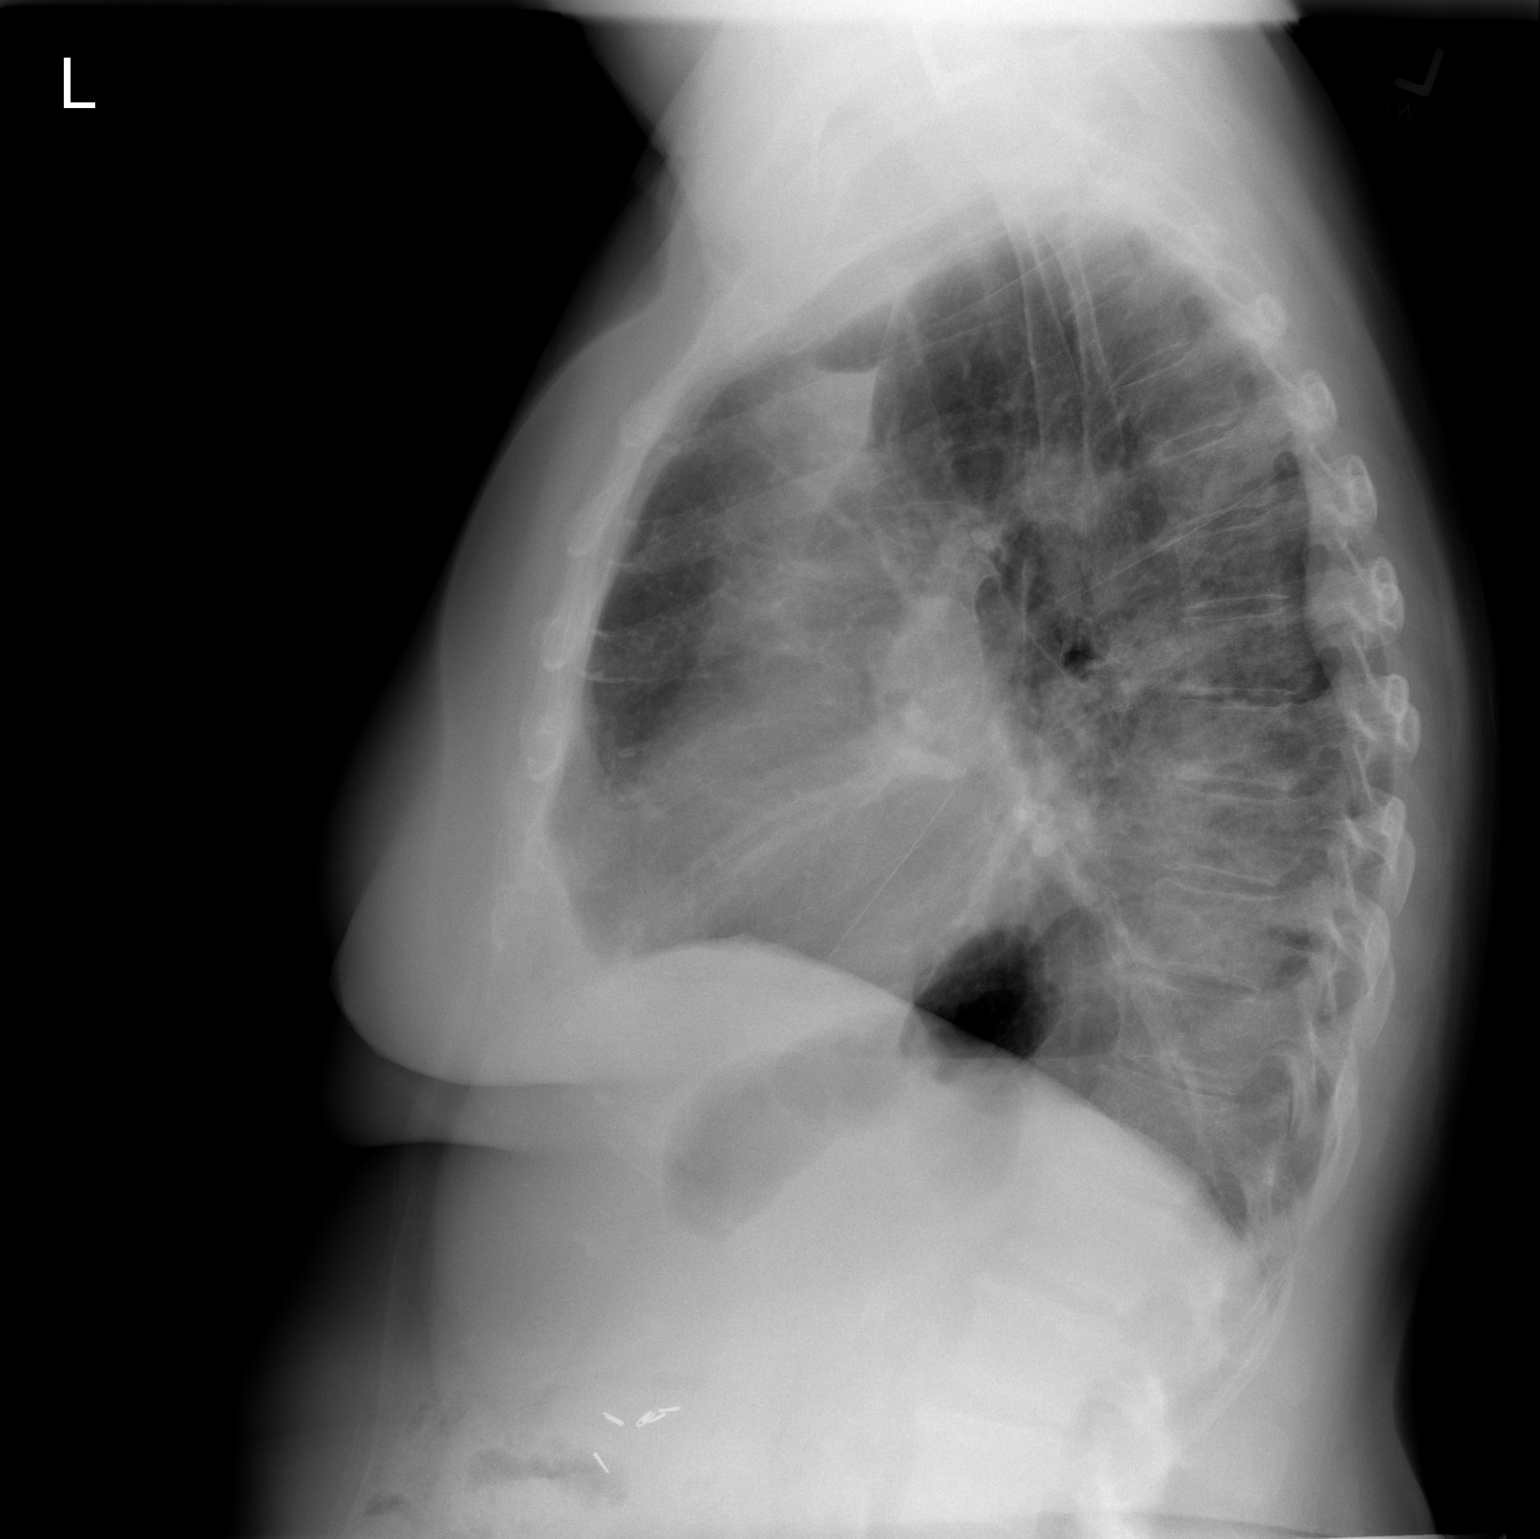

[2 of 2 positions shown; findings below may reference images not displayed]

FINDINGS: Patchy left lower lobe opacity, suspicious for pneumonia. Associated
small to moderate left pleural effusion.

Again noted is an air-fluid level in the anterior left upper lobe,
possibly reflecting a loculated hydropneumothorax or a cavitary
lesion with layering fluid, unchanged.

Right lung is clear.  No pneumothorax.

The heart is normal in size.

Mild degenerative changes of the visualized thoracolumbar spine.
Cervical spine fixation hardware.

Cholecystectomy clips.
IMPRESSION: Patchy left lower lobe opacity, suspicious for pneumonia. Associated
small to moderate left pleural effusion.

Stable air-fluid level in the anterior left upper lobe, possibly
reflecting a loculated hydropneumothorax or a cavitary lesion with
layering fluid.

## 2015-02-08 DIAGNOSIS — E785 Hyperlipidemia, unspecified: Secondary | ICD-10-CM | POA: Diagnosis not present

## 2015-02-08 DIAGNOSIS — N184 Chronic kidney disease, stage 4 (severe): Secondary | ICD-10-CM | POA: Diagnosis not present

## 2015-02-08 DIAGNOSIS — I129 Hypertensive chronic kidney disease with stage 1 through stage 4 chronic kidney disease, or unspecified chronic kidney disease: Secondary | ICD-10-CM | POA: Diagnosis not present

## 2015-02-08 DIAGNOSIS — M109 Gout, unspecified: Secondary | ICD-10-CM | POA: Diagnosis not present

## 2015-02-08 DIAGNOSIS — D649 Anemia, unspecified: Secondary | ICD-10-CM | POA: Diagnosis not present

## 2015-02-08 DIAGNOSIS — N028 Recurrent and persistent hematuria with other morphologic changes: Secondary | ICD-10-CM | POA: Diagnosis not present

## 2015-02-14 IMAGING — CR DG CHEST 2V
2 series · 2 of 2 positions shown · non-contrast
Comparison: Chest x-ray of 12/30/2013

CLINICAL DATA: Post left lower lobectomy for resection of lung
carcinoma, followup

EXAM:
CHEST  2 VIEW

[w chest pa]
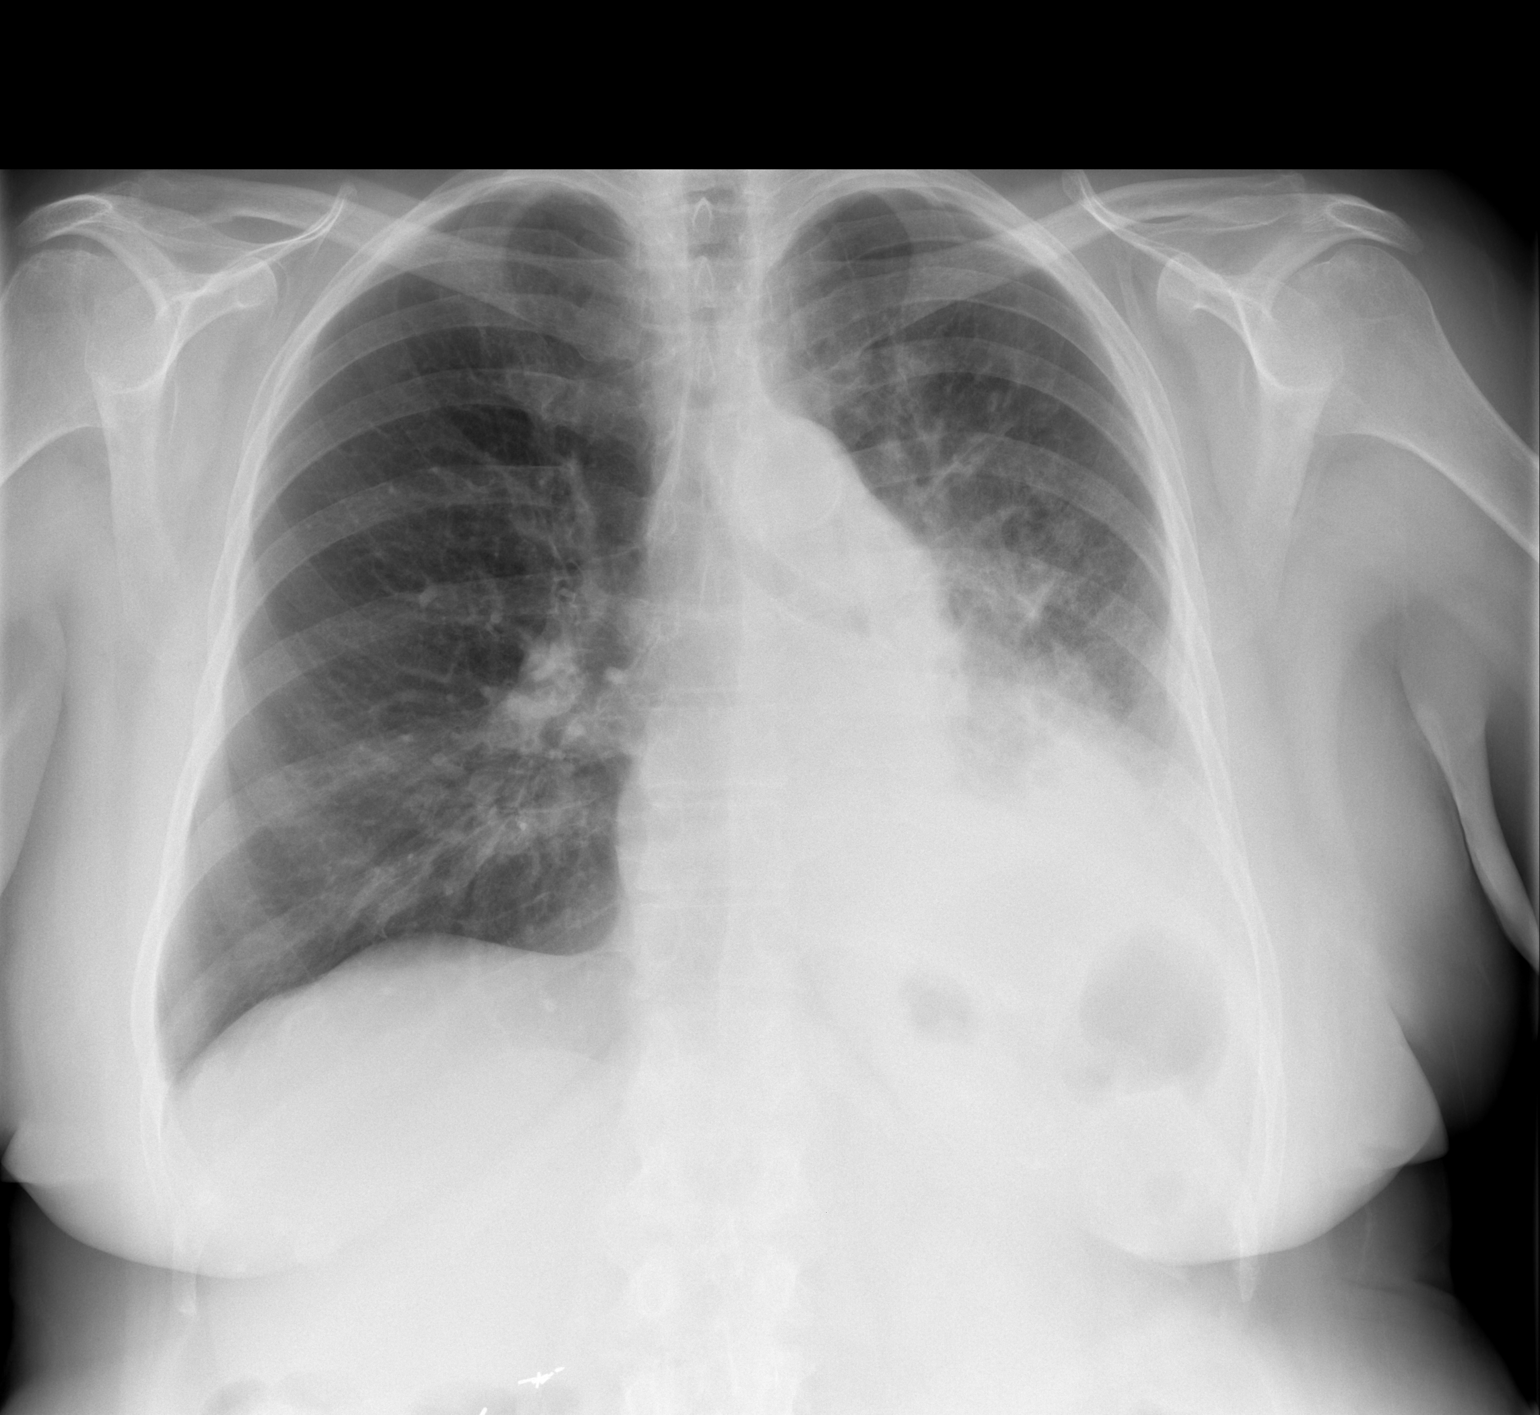

[w chest lat]
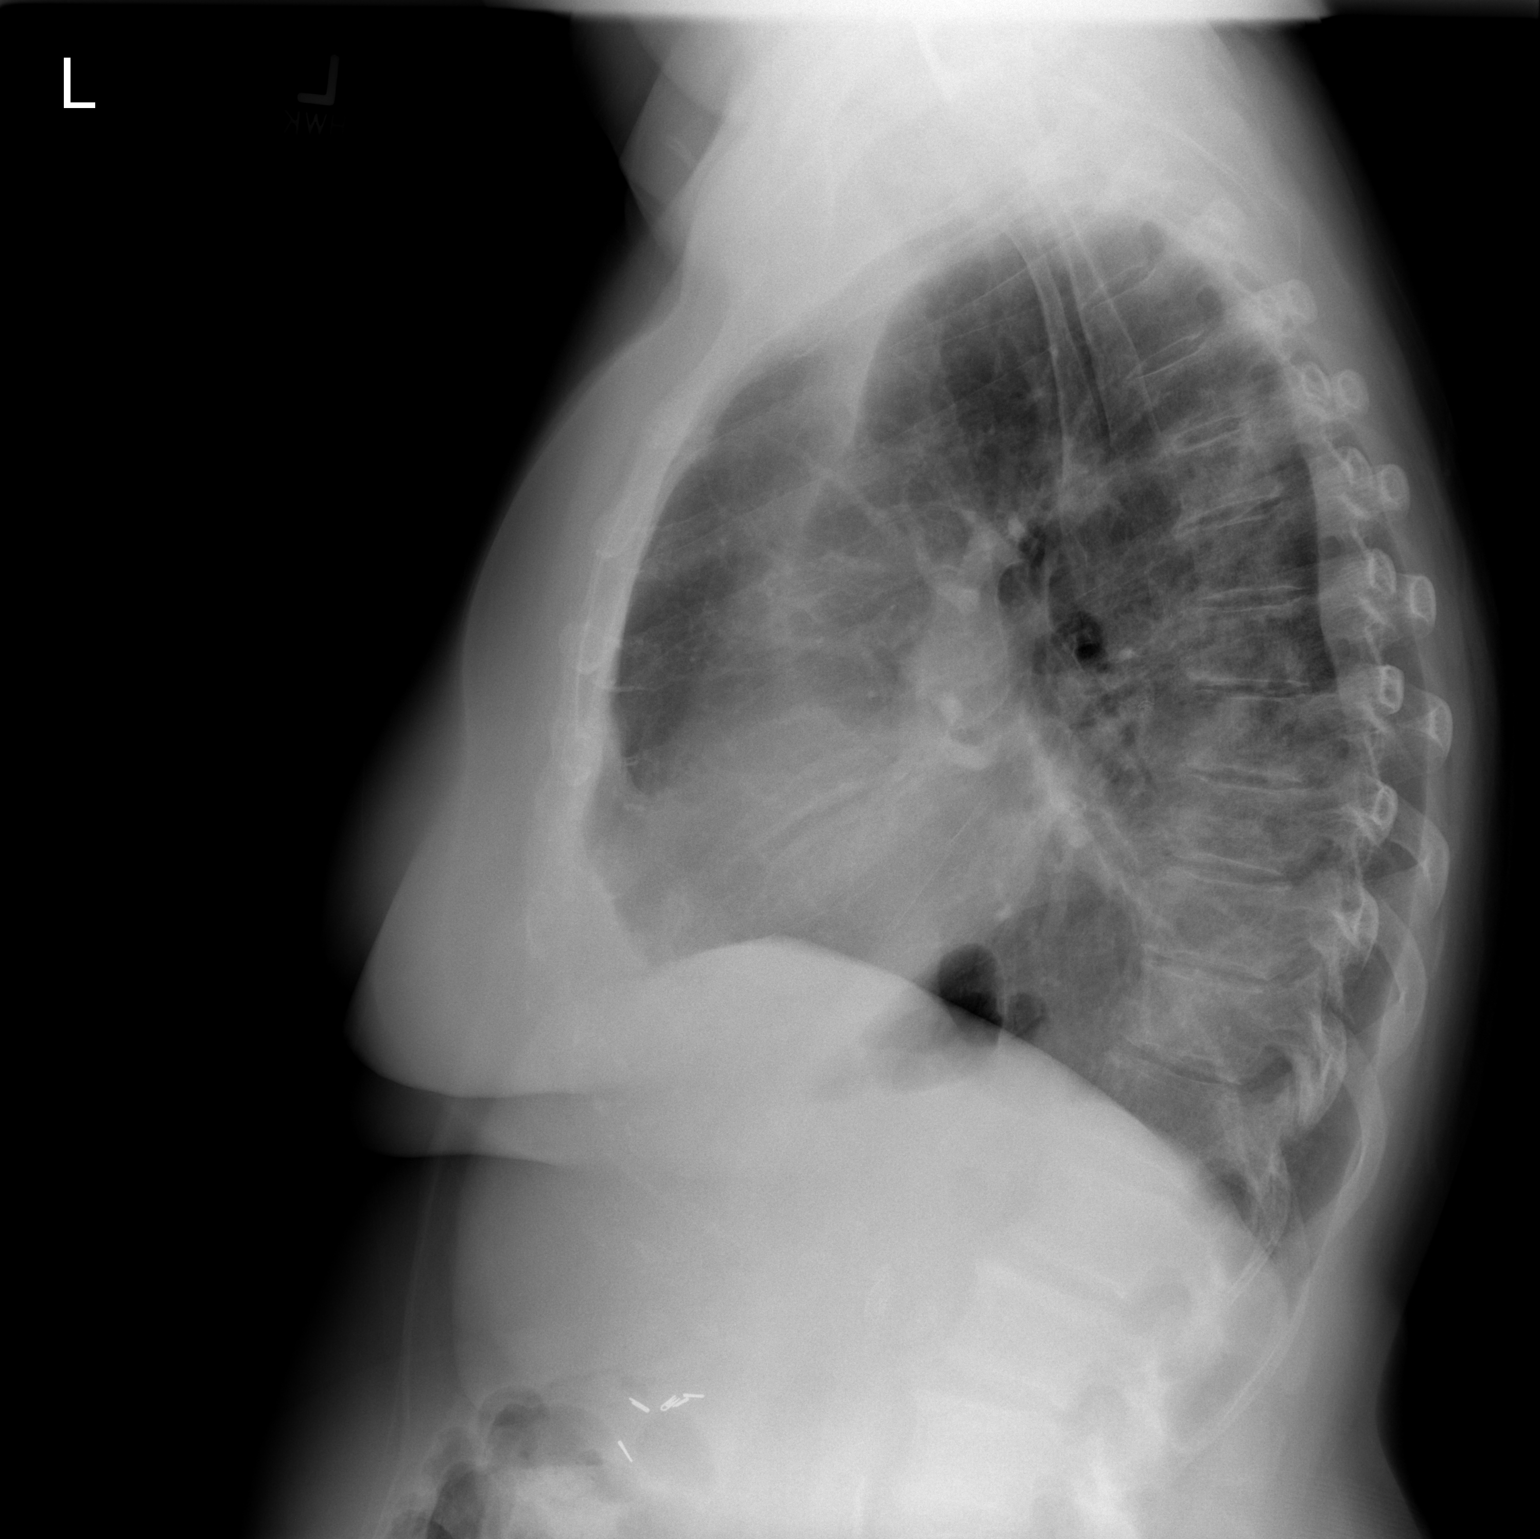

[2 of 2 positions shown; findings below may reference images not displayed]

FINDINGS: There is little change in pleural and parenchymal opacity at the
left lung base most consistent with left effusion and atelectasis.
Pneumonia would be very difficult to exclude. The right lung is
clear. Cardiomegaly is stable. A lower anterior cervical spine
fusion plate is noted.
IMPRESSION: Persistent opacity in the posterior left lower lobe most likely
postoperative left effusion and atelectasis. Cannot exclude
pneumonia. Recommend continued followu.p

## 2015-02-15 ENCOUNTER — Encounter: Payer: Self-pay | Admitting: Endocrinology

## 2015-02-20 ENCOUNTER — Other Ambulatory Visit (HOSPITAL_BASED_OUTPATIENT_CLINIC_OR_DEPARTMENT_OTHER): Payer: Medicare Other

## 2015-02-20 ENCOUNTER — Ambulatory Visit (HOSPITAL_COMMUNITY)
Admission: RE | Admit: 2015-02-20 | Discharge: 2015-02-20 | Disposition: A | Discharge status: Home / Self Care | Payer: Medicare Other | Source: Ambulatory Visit | Attending: Internal Medicine | Admitting: Internal Medicine

## 2015-02-20 DIAGNOSIS — C3432 Malignant neoplasm of lower lobe, left bronchus or lung: Secondary | ICD-10-CM | POA: Diagnosis not present

## 2015-02-20 DIAGNOSIS — Z85118 Personal history of other malignant neoplasm of bronchus and lung: Secondary | ICD-10-CM | POA: Diagnosis present

## 2015-02-20 DIAGNOSIS — Z902 Acquired absence of lung [part of]: Secondary | ICD-10-CM | POA: Diagnosis not present

## 2015-02-20 DIAGNOSIS — R918 Other nonspecific abnormal finding of lung field: Secondary | ICD-10-CM | POA: Diagnosis not present

## 2015-02-20 LAB — COMPREHENSIVE METABOLIC PANEL (CC13)
ALBUMIN: 3.6 g/dL (ref 3.5–5.0)
ALT: 11 U/L (ref 0–55)
ANION GAP: 14 meq/L — AB (ref 3–11)
AST: 15 U/L (ref 5–34)
Alkaline Phosphatase: 78 U/L (ref 40–150)
BUN: 48.2 mg/dL — AB (ref 7.0–26.0)
CHLORIDE: 103 meq/L (ref 98–109)
CO2: 30 mEq/L — ABNORMAL HIGH (ref 22–29)
CREATININE: 1.9 mg/dL — AB (ref 0.6–1.1)
Calcium: 9.5 mg/dL (ref 8.4–10.4)
EGFR: 25 mL/min/{1.73_m2} — ABNORMAL LOW (ref >90)
Glucose: 97 mg/dl (ref 70–140)
Potassium: 3.8 mEq/L (ref 3.5–5.1)
Sodium: 147 mEq/L — ABNORMAL HIGH (ref 136–145)
Total Bilirubin: 0.37 mg/dL (ref 0.20–1.20)
Total Protein: 6.2 g/dL — ABNORMAL LOW (ref 6.4–8.3)

## 2015-02-20 LAB — CBC WITH DIFFERENTIAL/PLATELET
BASO%: 0.4 % (ref 0.0–2.0)
BASOS ABS: 0 10*3/uL (ref 0.0–0.1)
EOS%: 2.2 % (ref 0.0–7.0)
Eosinophils Absolute: 0.2 10*3/uL (ref 0.0–0.5)
HEMATOCRIT: 37.8 % (ref 34.8–46.6)
HEMOGLOBIN: 11.7 g/dL (ref 11.6–15.9)
LYMPH#: 1.7 10*3/uL (ref 0.9–3.3)
LYMPH%: 23.2 % (ref 14.0–49.7)
MCH: 28.7 pg (ref 25.1–34.0)
MCHC: 31 g/dL — ABNORMAL LOW (ref 31.5–36.0)
MCV: 92.6 fL (ref 79.5–101.0)
MONO#: 0.6 10*3/uL (ref 0.1–0.9)
MONO%: 8.5 % (ref 0.0–14.0)
NEUT#: 4.8 10*3/uL (ref 1.5–6.5)
NEUT%: 65.7 % (ref 38.4–76.8)
Platelets: 272 10*3/uL (ref 145–400)
RBC: 4.08 10*6/uL (ref 3.70–5.45)
RDW: 17 % — ABNORMAL HIGH (ref 11.2–14.5)
WBC: 7.3 10*3/uL (ref 3.9–10.3)

## 2015-02-21 ENCOUNTER — Telehealth: Payer: Self-pay | Admitting: *Deleted

## 2015-02-21 NOTE — Telephone Encounter (Signed)
Unable to reach patient at time of Pre-Visit Call.  Left message for patient to return call when available.    

## 2015-02-22 ENCOUNTER — Other Ambulatory Visit: Payer: Self-pay | Admitting: Family Medicine

## 2015-02-22 ENCOUNTER — Encounter: Payer: Self-pay | Admitting: Family Medicine

## 2015-02-22 ENCOUNTER — Ambulatory Visit (INDEPENDENT_AMBULATORY_CARE_PROVIDER_SITE_OTHER): Payer: Medicare Other | Admitting: Family Medicine

## 2015-02-22 VITALS — BP 130/84 | HR 58 | Temp 97.9°F | Resp 16 | Ht 64.5 in | Wt 229.5 lb

## 2015-02-22 DIAGNOSIS — I2510 Atherosclerotic heart disease of native coronary artery without angina pectoris: Secondary | ICD-10-CM

## 2015-02-22 DIAGNOSIS — I10 Essential (primary) hypertension: Secondary | ICD-10-CM

## 2015-02-22 DIAGNOSIS — E099 Drug or chemical induced diabetes mellitus without complications: Secondary | ICD-10-CM | POA: Diagnosis not present

## 2015-02-22 DIAGNOSIS — H918X3 Other specified hearing loss, bilateral: Secondary | ICD-10-CM

## 2015-02-22 DIAGNOSIS — T380X5A Adverse effect of glucocorticoids and synthetic analogues, initial encounter: Secondary | ICD-10-CM

## 2015-02-22 DIAGNOSIS — C3432 Malignant neoplasm of lower lobe, left bronchus or lung: Secondary | ICD-10-CM | POA: Diagnosis not present

## 2015-02-22 DIAGNOSIS — Z0000 Encounter for general adult medical examination without abnormal findings: Secondary | ICD-10-CM

## 2015-02-22 DIAGNOSIS — N183 Chronic kidney disease, stage 3 unspecified: Secondary | ICD-10-CM

## 2015-02-22 DIAGNOSIS — E038 Other specified hypothyroidism: Secondary | ICD-10-CM | POA: Diagnosis not present

## 2015-02-22 DIAGNOSIS — Z23 Encounter for immunization: Secondary | ICD-10-CM | POA: Diagnosis not present

## 2015-02-22 DIAGNOSIS — Z780 Asymptomatic menopausal state: Secondary | ICD-10-CM

## 2015-02-22 DIAGNOSIS — Z1231 Encounter for screening mammogram for malignant neoplasm of breast: Secondary | ICD-10-CM

## 2015-02-22 DIAGNOSIS — E785 Hyperlipidemia, unspecified: Secondary | ICD-10-CM | POA: Diagnosis not present

## 2015-02-22 DIAGNOSIS — H6123 Impacted cerumen, bilateral: Secondary | ICD-10-CM

## 2015-02-22 LAB — TSH: TSH: 0.69 u[IU]/mL (ref 0.35–4.50)

## 2015-02-22 MED ORDER — ONETOUCH DELICA LANCETS 33G MISC: Status: AC

## 2015-02-22 MED ORDER — POLYETHYLENE GLYCOL 3350 17 GM/SCOOP PO POWD: 17.0000 g | Freq: Every day | ORAL | Status: DC

## 2015-02-22 MED ORDER — NEBIVOLOL HCL 20 MG PO TABS: 1.0000 | ORAL_TABLET | Freq: Every day | ORAL | Status: DC

## 2015-02-22 MED ORDER — HYDRALAZINE HCL 50 MG PO TABS: ORAL_TABLET | ORAL | Status: DC

## 2015-02-22 NOTE — Patient Instructions (Signed)
Follow up in 6 months to recheck cholesterol We'll notify you of your thyroid labs and make any changes if needed Keep up the good work on healthy diet and regular exercise We'll call you with your bone density and mammo appts Call with any questions or concerns Happy [place]!!!

## 2015-02-22 NOTE — Assessment & Plan Note (Signed)
Cerumen removed from ears bilaterally- hearing improved.

## 2015-02-22 NOTE — Assessment & Plan Note (Signed)
Chronic problem, following w/ Dr Loanne Drilling.  Recent A1C showed excellent control

## 2015-02-22 NOTE — Assessment & Plan Note (Signed)
Chronic problem.  Well controlled.  Asymptomatic.  Reviewed labs done by renal- no med changes at this time.

## 2015-02-22 NOTE — Assessment & Plan Note (Signed)
S/p LLL lobectomy.  Continues to follow w/ Dr Inda Merlin and Dr Servando Snare.

## 2015-02-22 NOTE — Assessment & Plan Note (Signed)
Chronic problem.  Following w/ cardiology.  Not only CAD but RAS.  Goal is risk reduction w/ BP, lipid and DM control.  Will continue to follow.

## 2015-02-22 NOTE — Assessment & Plan Note (Signed)
Pt's PE unchanged from previous- pt actually looking better than before!  Due for colonoscopy- pt declines.  Due for mammo and DEXA- order entered.  Written screening schedule updated and given to pt.  Prevnar updated today.  Reviewed labs done at Renal- only missing lab is TSH, ordered today.  Anticipatory guidance provided.

## 2015-02-22 NOTE — Assessment & Plan Note (Signed)
Chronic problem.  Following w/ Dr Lorrene Reid.  Recent labs showed improved renal fxn.  She was able to decrease her steroid dose which has improved her overall health.  Will continue to follow.

## 2015-02-22 NOTE — Progress Notes (Signed)
   Subjective:    Patient ID: Susan Pittman, female    DOB: [birth year], 74 y.o.   MRN: [id number]  HPI Here today for CPE.  Risk Factors: HTN- chronic problem, on Lasix, Hydralazine, Bystolic. DM- steroid induced due to IgA nephropathy.  Following w/ Endo.  Not currently on medication. Gout- on allopurinol and colchicine  Hyperlipidemia- chronic problem, not currently on statin. Hypothyroid- chronic problem, on Synthroid Renal insufficiency- chronic problem, following w/ Dr Lorrene Reid. Lung Cancer- chronic problem, s/p LLL lobectomy Diastolic CHF- chronic problem, on Lasix, beta blocker.  Following w/ Cards. Physical Activity: working to increase physical activity, was doing water aerobics and plans to start riding stationary bike Fall Risk: mild-moderate, ambulates w/ cane Depression: denies current sxs Hearing: decreased to conversational tones/whispered voice ADL's: independent Cognitive: normal linear thought process, memory and attention intact Home Safety: safe at home, lives w/ husband Height, Weight, BMI, Visual Acuity: see vitals, vision corrected to 20/20 w/ glasses Counseling: due for colonoscopy- pt plans to schedule in the future but wants to hold on this for now.  Due for mammo and DEXA- [facility].  Due for Prevnar.  UTD on eye exam. Labs Ordered: See A&P Care Plan: See A&P    Review of Systems Patient reports no vision changes, adenopathy,fever, weight change,  persistant/recurrent hoarseness , swallowing issues, chest pain, palpitations, edema, persistant/recurrent cough, hemoptysis, dyspnea (rest/exertional/paroxysmal nocturnal), gastrointestinal bleeding (melena, rectal bleeding), abdominal pain, significant heartburn, bowel changes, GU symptoms (dysuria, hematuria, incontinence), Gyn symptoms (abnormal  bleeding, pain),  syncope, focal weakness, memory loss, numbness & tingling, skin/hair/nail changes, abnormal bruising or bleeding, anxiety, or depression.     Objective:   Physical Exam General Appearance:    Alert, cooperative, no distress, appears stated age, obese  Head:    Normocephalic, without obvious abnormality, atraumatic  Eyes:    PERRL, conjunctiva/corneas clear, EOM's intact, fundi    benign, both eyes  Ears:    TMs obscured by cerumen bilaterally.  Unable to successful curette so ears were irrigated bilaterally w/ complete success.  TMs WNL  Nose:   Nares normal, septum midline, mucosa normal, no drainage    or sinus tenderness  Throat:   Lips, mucosa, and tongue normal; teeth and gums normal  Neck:   Supple, symmetrical, trachea midline, no adenopathy;    Thyroid: no enlargement/tenderness/nodules  Back:     Symmetric, no curvature, ROM normal, no CVA tenderness  Lungs:     Clear to auscultation bilaterally, respirations unlabored  Chest Wall:    No tenderness or deformity   Heart:    Regular rate and rhythm, S1 and S2 normal, no murmur, rub   or gallop  Breast Exam:    Deferred to mammo  Abdomen:     Soft, non-tender, bowel sounds active all four quadrants,    no masses, no organomegaly  Genitalia:    Deferred  Rectal:    Extremities:   Extremities normal, atraumatic, no cyanosis or edema  Pulses:   2+ and symmetric all extremities  Skin:   Skin color, texture, turgor normal, no rashes or lesions  Lymph nodes:   Cervical, supraclavicular, and axillary nodes normal  Neurologic:   CNII-XII intact, normal strength, sensation and reflexes    throughout          Assessment & Plan:

## 2015-02-22 NOTE — Assessment & Plan Note (Signed)
Check TSH.  Adjust meds prn.

## 2015-02-22 NOTE — Assessment & Plan Note (Signed)
Chronic problem.  Not currently on meds.  Reviewed recent labs done at Renal- no need for medication at this time.  Will continue to follow.

## 2015-02-22 NOTE — Progress Notes (Signed)
Pre visit review using our clinic review tool, if applicable. No additional management support is needed unless otherwise documented below in the visit note. 

## 2015-02-27 ENCOUNTER — Ambulatory Visit (HOSPITAL_BASED_OUTPATIENT_CLINIC_OR_DEPARTMENT_OTHER): Payer: Medicare Other | Admitting: Internal Medicine

## 2015-02-27 ENCOUNTER — Telehealth: Payer: Self-pay | Admitting: Internal Medicine

## 2015-02-27 ENCOUNTER — Encounter: Payer: Self-pay | Admitting: Internal Medicine

## 2015-02-27 VITALS — BP 168/57 | HR 63 | Temp 98.3°F | Resp 18 | Ht 64.0 in | Wt 229.3 lb

## 2015-02-27 DIAGNOSIS — Z85118 Personal history of other malignant neoplasm of bronchus and lung: Secondary | ICD-10-CM

## 2015-02-27 DIAGNOSIS — C3432 Malignant neoplasm of lower lobe, left bronchus or lung: Secondary | ICD-10-CM

## 2015-02-27 NOTE — Telephone Encounter (Signed)
Gave avs & calendar for October. °

## 2015-02-27 NOTE — Progress Notes (Signed)
[place] [phone]   [phone]  OFFICE PROGRESS NOTE  [name], [place] [id number]  DIAGNOSIS: Stage IA (T1a, N0, M0) non-small cell lung cancer, adenocarcinoma diagnosed in January of 2015.  PRIOR THERAPY: Bronchoscopy, left video-assisted thoracoscopy, with wedge resection for biopsy of left lower lobe lesion, completion left lower lobectomy, lymph node dissection under the care of Dr. Servando Snare  CURRENT THERAPY: Observation.  INTERVAL HISTORY: Susan Pittman 74 y.o. female returns to the clinic today for followup visit accompanied by her husband. The patient is feeling fine today with no specific complaints except for anxiety about her recent CT scan. She denied having any significant chest pain but continues to have shortness breath with exertion. She has no cough or hemoptysis. She denied having any significant nausea or vomiting. The patient had repeat CT scan of the chest performed recently and she is here for evaluation and discussion of her scan results.  MEDICAL HISTORY: Past Medical History  Diagnosis Date  . Asthma   . Hypertension   . GERD (gastroesophageal reflux disease)   . Allergy   . Hypothyroid   . Osteoporosis   . Gastric ulcer   . Hiatal hernia   . Hyperlipidemia     "don't take RX for it" (11/08/2013)  . Renal artery stenosis     with stent placement  . Lung mass     superior segment LLL/notes 11/08/2013  . Pneumonia     "I've had it ?3 times" (11/08/2013)  . Arthritis     "knees, hips, back, hands" (11/08/2013)  . DJD (degenerative joint disease)   . Lung nodule/ adenoca > LLobectomy 12/15/13  11/08/2013    PET 11/23/2013 1. Dominant sub solid left lower lobe nodule does not demonstrate significantly increased metabolic activity. However, morphologically, adenocarcinoma is still a concern.   - Spirometry 11/24/13 wnl  - 11/24/2013 T surg eval rec> LLower lobectomy 12/15/2013 for  adenoca   . CAD (coronary artery disease), non obstructive by cardiac cath 12/12/13 01/24/2014  . Basal cell carcinoma     "cut them off face & right shoulder" (11/08/2013)  . Diastolic CHF   . CKD (chronic kidney disease), stage III   . Nephropathy     ALLERGIES:  is allergic to benazepril hcl; loratadine; celecoxib; lisinopril; allegra; and sulfa antibiotics.  MEDICATIONS:  Current Outpatient Prescriptions  Medication Sig Dispense Refill  . allopurinol (ZYLOPRIM) 100 MG tablet Take 100 mg by mouth 2 (two) times daily. Takes 2 tablets in am    . Blood Glucose Monitoring Suppl (ONE TOUCH ULTRA 2) W/DEVICE KIT 1 Device by Does not apply route once. 1 each 0  . Calcium Carb-Cholecalciferol 600-800 MG-UNIT TABS Take 1 tablet by mouth daily.    . cetirizine (ZYRTEC) 10 MG tablet Take 10 mg by mouth at bedtime.     . colchicine 0.6 MG tablet Take 0.6 mg by mouth every other day.     . Ferrous Sulfate (IRON SUPPLEMENT PO) Take 150 mg by mouth 2 (two) times daily.    . fluticasone (FLONASE) 50 MCG/ACT nasal spray USE TWO SPRAY IN EACH NOSTRIL EVERY DAY 16 g 6  . furosemide (LASIX) 40 MG tablet 40 mg. Takes  12m in the pm until weight of 219 is reached. *( and 80 mg in am)    . furosemide (LASIX) 80 MG tablet Take 80 mg by mouth every morning.     .Marland Kitchen  glucose blood (ONE TOUCH ULTRA TEST) test strip Pt to test 3-4x daily due to labile sugars Dx. 250.02 100 each 12  . hydrALAZINE (APRESOLINE) 50 MG tablet 1 tab twice daily 60 tablet 6  . Lancets (ONETOUCH ULTRASOFT) lancets Use 1 per day dx code 249.00 100 each 12  . lansoprazole (PREVACID) 15 MG capsule Take 15 mg by mouth daily at 12 noon.    Marland Kitchen levothyroxine (SYNTHROID, LEVOTHROID) 88 MCG tablet Take 1 tablet (88 mcg total) by mouth daily before breakfast. 90 tablet 1  . Nebivolol HCl 20 MG TABS Take 1 tablet (20 mg total) by mouth daily. 30 tablet 6  . ONETOUCH DELICA LANCETS 40J MISC Use to check blood sugar once daily as instructed. Dx code: E11.9  100 each 2  . predniSONE (DELTASONE) 5 MG tablet     . albuterol (PROVENTIL HFA;VENTOLIN HFA) 108 (90 BASE) MCG/ACT inhaler Inhale 1-2 puffs into the lungs every 6 (six) hours as needed for wheezing or shortness of breath. (Patient not taking: Reported on 02/27/2015) 18 g 1  . DULERA 100-5 MCG/ACT AERO Inhale 2 puffs into the lungs 2 (two) times daily as needed for shortness of breath.     . polyethylene glycol powder (GLYCOLAX/MIRALAX) powder Take 17 g by mouth daily. (Patient not taking: Reported on 02/27/2015) 850 g 3   No current facility-administered medications for this visit.    SURGICAL HISTORY:  Past Surgical History  Procedure Laterality Date  . Knee arthroscopy Bilateral   . Anterior cervical decomp/discectomy fusion  2000    C5-C6  . Renal artery stent Right 11/08/2013    Archie Endo 11/08/2013  . Tonsillectomy and adenoidectomy  ?1946  . Cholecystectomy  1970's  . Vaginal hysterectomy  1970  . Dilation and curettage of uterus  1960's  . Skin cancer excision    . Coronary angiogram      Hx: of 2/ 2015  . Cardiac catheterization      Hx: of 2/ 2015  . Video bronchoscopy N/A 12/15/2013    Procedure: VIDEO BRONCHOSCOPY;  Surgeon: Grace Isaac, MD;  Location: [facility] OR;  Service: Thoracic;  Laterality: N/A;  . Video assisted thoracoscopy (vats)/wedge resection Left 12/15/2013    Procedure: VIDEO ASSISTED THORACOSCOPY (VATS)/WEDGE RESECTION;  Surgeon: Grace Isaac, MD;  Location: [place];  Service: Thoracic;  Laterality: Left;  . Lobectomy Left 12/15/2013    Procedure: LOBECTOMY;  Surgeon: Grace Isaac, MD;  Location: [place];  Service: Thoracic;  Laterality: Left;  . Renal angiogram Bilateral 11/08/2013    Procedure: RENAL ANGIOGRAM;  Surgeon: Lorretta Harp, MD;  Location: [facility] CATH LAB;  Service: Cardiovascular;  Laterality: Bilateral;  . Percutaneous stent intervention Right 11/08/2013    Procedure: PERCUTANEOUS STENT INTERVENTION;  Surgeon: Lorretta Harp, MD;  Location: [facility] CATH  LAB;  Service: Cardiovascular;  Laterality: Right;  rt renal artery stent  . Left heart catheterization with coronary angiogram N/A 12/13/2013    Procedure: LEFT HEART CATHETERIZATION WITH CORONARY ANGIOGRAM;  Surgeon: Burnell Blanks, MD;  Location: [facility] CATH LAB;  Service: Cardiovascular;  Laterality: N/A;    REVIEW OF SYSTEMS:  A comprehensive review of systems was negative except for: Respiratory: positive for dyspnea on exertion   PHYSICAL EXAMINATION: General appearance: alert, cooperative and no distress Head: Normocephalic, without obvious abnormality, atraumatic Neck: no adenopathy, no JVD, supple, symmetrical, trachea midline and thyroid not enlarged, symmetric, no tenderness/mass/nodules Lymph nodes: Cervical, supraclavicular, and axillary nodes normal. Resp: clear to auscultation bilaterally  Back: symmetric, no curvature. ROM normal. No CVA tenderness. Cardio: regular rate and rhythm, S1, S2 normal, no murmur, click, rub or gallop GI: soft, non-tender; bowel sounds normal; no masses,  no organomegaly Extremities: extremities normal, atraumatic, no cyanosis or edema  ECOG PERFORMANCE STATUS: 1 - Symptomatic but completely ambulatory  Blood pressure 168/57, pulse 63, temperature 98.3 F (36.8 C), temperature source Oral, resp. rate 18, height 5' 4"  (1.626 m), weight 229 lb 4.8 oz (104.01 kg), SpO2 99 %.  LABORATORY DATA: Lab Results  Component Value Date   WBC 7.3 02/20/2015   HGB 11.7 02/20/2015   HCT 37.8 02/20/2015   MCV 92.6 02/20/2015   PLT 272 02/20/2015      Chemistry      Component Value Date/Time   NA 147* 02/20/2015 0807   NA 144 08/25/2014 1500   K 3.8 02/20/2015 0807   K 3.4* 08/25/2014 1500   CL 99 08/25/2014 1500   CO2 30* 02/20/2015 0807   CO2 30 08/25/2014 1500   BUN 48.2* 02/20/2015 0807   BUN 62* 08/25/2014 1500   CREATININE 1.9* 02/20/2015 0807   CREATININE 2.20* 08/25/2014 1500   CREATININE 2.34* 02/14/2014 1030      Component Value  Date/Time   CALCIUM 9.5 02/20/2015 0807   CALCIUM 9.5 08/25/2014 1500   ALKPHOS 78 02/20/2015 0807   ALKPHOS 51 08/25/2014 1500   AST 15 02/20/2015 0807   AST 16 08/25/2014 1500   ALT 11 02/20/2015 0807   ALT 15 08/25/2014 1500   BILITOT 0.37 02/20/2015 0807   BILITOT <0.2* 08/25/2014 1500       RADIOGRAPHIC STUDIES: Ct Chest Wo Contrast  02/20/2015   CLINICAL DATA:  Left lung cancer restaging.  EXAM: CT CHEST WITHOUT CONTRAST  TECHNIQUE: Multidetector CT imaging of the chest was performed following the standard protocol without IV contrast.  COMPARISON:  08/19/2014  FINDINGS: Mediastinum: The heart size is normal. There is no pericardial effusion. Calcified atherosclerotic plaque involves the thoracic aorta. The trachea appears patent and is midline. Normal appearance of the esophagus. No enlarged mediastinal or hilar lymph nodes identified.  Lungs/Pleura: There is no pleural effusion identified. There is no airspace consolidation identified. Right upper lobe pulmonary nodule measures 4 mm, image 20/series 5. This is stable from previous exam. Right apical nodule is stable measuring 3 mm, image 11/series 5. Postoperative change in volume loss involving the left hemi thorax identified compatible with prior left lower lobe lobectomy. Faint nodule in the left midlung is unchanged measuring 2-3 mm, image 29/series 5.  Upper Abdomen: The adrenal glands are both normal. The visualized portions of the liver and spleen are unremarkable. Normal appearance of the pancreas.  Musculoskeletal: Review of the visualized osseous structures is negative for aggressive lytic or sclerotic bone lesion.  IMPRESSION: 1. No acute findings. No specific features identified to suggest recurrence of tumor status post left lower lobectomy. 2. Stable small nonspecific nodules.   Electronically Signed   By: Kerby Moors M.D.   On: 02/20/2015 09:34   ASSESSMENT AND PLAN: Ms. a very pleasant 74 years old white female with  history of stage IA non-small cell lung cancer status post left lower lobectomy with lymph node dissection.  The patient has no complaints today. Her recent CT scan of the chest showed no evidence for disease recurrence. I discussed the scan results with the patient today. I recommended for her to continue on observation with repeat CT scan of the chest in 6 months for  evaluation of her disease. She was advised to call immediately if she has any concerning symptoms in the interval. The patient voices understanding of current disease status and treatment options and is in agreement with the current care plan.  All questions were answered. The patient knows to call the clinic with any problems, questions or concerns. We can certainly see the patient much sooner if necessary.  Disclaimer: This note was dictated with voice recognition software. Similar sounding words can inadvertently be transcribed and may not be corrected upon review.

## 2015-03-02 ENCOUNTER — Ambulatory Visit (INDEPENDENT_AMBULATORY_CARE_PROVIDER_SITE_OTHER): Payer: Medicare Other | Admitting: Cardiothoracic Surgery

## 2015-03-02 ENCOUNTER — Encounter: Payer: Self-pay | Admitting: Family Medicine

## 2015-03-02 ENCOUNTER — Encounter: Payer: Self-pay | Admitting: Cardiothoracic Surgery

## 2015-03-02 VITALS — BP 132/66 | HR 63 | Resp 20 | Ht 64.0 in | Wt 229.0 lb

## 2015-03-02 DIAGNOSIS — I2510 Atherosclerotic heart disease of native coronary artery without angina pectoris: Secondary | ICD-10-CM | POA: Diagnosis not present

## 2015-03-02 DIAGNOSIS — J9811 Atelectasis: Secondary | ICD-10-CM

## 2015-03-02 DIAGNOSIS — M899 Disorder of bone, unspecified: Secondary | ICD-10-CM | POA: Diagnosis not present

## 2015-03-02 DIAGNOSIS — C3432 Malignant neoplasm of lower lobe, left bronchus or lung: Secondary | ICD-10-CM | POA: Diagnosis not present

## 2015-03-02 DIAGNOSIS — Z803 Family history of malignant neoplasm of breast: Secondary | ICD-10-CM | POA: Diagnosis not present

## 2015-03-02 DIAGNOSIS — Z9889 Other specified postprocedural states: Secondary | ICD-10-CM | POA: Diagnosis not present

## 2015-03-02 DIAGNOSIS — Z902 Acquired absence of lung [part of]: Secondary | ICD-10-CM

## 2015-03-02 DIAGNOSIS — Z1231 Encounter for screening mammogram for malignant neoplasm of breast: Secondary | ICD-10-CM | POA: Diagnosis not present

## 2015-03-02 LAB — HM MAMMOGRAPHY

## 2015-03-02 NOTE — Progress Notes (Signed)
[address]       [place] [address]             [phone]                       Susan Pittman [place] Medical Record #[id number] Date of Birth: [birth year]  Referring XB:JYNW, Christena Deem, MD Primary Cardiology:Dr Gwenlyn Found Primary Care:Katherine [name], MD  Chief Complaint:   PostOp Follow Up Visit 12/15/2013  OPERATIVE REPORT  PREOPERATIVE DIAGNOSIS: Left lower lobe lung mass.  POSTOPERATIVE DIAGNOSIS: Left lower lobe lung mass. Adenocarcinoma by  frozen section.  PROCEDURE PERFORMED: Bronchoscopy, left video-assisted thoracoscopy,  with wedge resection for biopsy of left lower lobe lesion, completion  left lower lobectomy, lymph node dissection, placement of On-Q device.  SURGEON: Lanelle Bal, MD   Lung cancer, left lower lobe   Primary site: Lung (Left)   Staging method: AJCC 7th Edition   Pathologic: Stage IA (T1a, N0, cM0) signed by Grace Isaac, MD on 12/17/2013  8:37 AM   Summary: Stage IA (T1a, N0, cM0)  History of Present Illness:     Patient returns to the office today after left lower lobectomy for stage I adenocarcinoma the lung. Feels better now as her steroid doses have decreased. She is ambulating better but with a cane. Has had marked decrease in peripheral edema compared to 6 months ago. Her steroid dose has now been decreased to 5 mg a day.  History  Smoking status  . Never Smoker   Smokeless tobacco  . Never Used       Allergies  Allergen Reactions  . Benazepril Hcl Anaphylaxis and Cough  . Loratadine Anaphylaxis    anaphylaxis  . Celecoxib     REACTION: aching  . Lisinopril     REACTION: cough  . Allegra [Fexofenadine Hcl]     Severe back pain  . Sulfa Antibiotics Hives    Current Outpatient Prescriptions  Medication Sig Dispense Refill  . albuterol (PROVENTIL HFA;VENTOLIN HFA) 108 (90 BASE) MCG/ACT inhaler Inhale 1-2 puffs into the lungs every 6 (six) hours as needed for wheezing or shortness of  breath. 18 g 1  . allopurinol (ZYLOPRIM) 100 MG tablet Take 100 mg by mouth 2 (two) times daily. Takes 2 tablets in am    . Blood Glucose Monitoring Suppl (ONE TOUCH ULTRA 2) W/DEVICE KIT 1 Device by Does not apply route once. 1 each 0  . Calcium Carb-Cholecalciferol 600-800 MG-UNIT TABS Take 1 tablet by mouth daily.    . cetirizine (ZYRTEC) 10 MG tablet Take 10 mg by mouth at bedtime.     . colchicine 0.6 MG tablet Take 0.6 mg by mouth every other day.     . DULERA 100-5 MCG/ACT AERO Inhale 2 puffs into the lungs 2 (two) times daily as needed for shortness of breath.     . Ferrous Sulfate (IRON SUPPLEMENT PO) Take 150 mg by mouth 2 (two) times daily.    . fluticasone (FLONASE) 50 MCG/ACT nasal spray USE TWO SPRAY IN EACH NOSTRIL EVERY DAY 16 g 6  . furosemide (LASIX) 40 MG tablet 40 mg. Takes  67m in the pm until weight of 219 is reached. *( and 80 mg in am)    . furosemide (LASIX) 80 MG tablet Take 80 mg by mouth every morning.     .Marland Kitchenglucose blood (ONE TOUCH ULTRA TEST) test strip Pt to test  3-4x daily due to labile sugars Dx. 250.02 100 each 12  . hydrALAZINE (APRESOLINE) 50 MG tablet 1 tab twice daily 60 tablet 6  . Lancets (ONETOUCH ULTRASOFT) lancets Use 1 per day dx code 249.00 100 each 12  . lansoprazole (PREVACID) 15 MG capsule Take 15 mg by mouth daily at 12 noon.    Marland Kitchen levothyroxine (SYNTHROID, LEVOTHROID) 88 MCG tablet Take 1 tablet (88 mcg total) by mouth daily before breakfast. 90 tablet 1  . Nebivolol HCl 20 MG TABS Take 1 tablet (20 mg total) by mouth daily. 30 tablet 6  . ONETOUCH DELICA LANCETS 15A MISC Use to check blood sugar once daily as instructed. Dx code: E11.9 100 each 2  . polyethylene glycol powder (GLYCOLAX/MIRALAX) powder Take 17 g by mouth daily. 850 g 3  . predniSONE (DELTASONE) 5 MG tablet      No current facility-administered medications for this visit.       Physical Exam: BP 132/66 mmHg  Pulse 63  Resp 20  Ht 5' 4"  (1.626 m)  Wt 229 lb (103.874 kg)   BMI 39.29 kg/m2  SpO2 94%  General appearance: alert and cooperative Neurologic: intact Heart: regular rate and rhythm, S1, S2 normal, no murmur, click, rub or gallop Lungs: diminished breath sounds LLL Abdomen: soft, non-tender; bowel sounds normal; no masses,  no organomegaly Extremities: extremities normal, atraumatic, no cyanosis , Homans sign is negative, patient has 2+ bilateral pedal edema but improved from previous exam  Wound: The larger incision and anterior chest tube site are well-healed. Chest tube site is completely healed the other incisions are totally healed without evidence of infection Patient has no cervical or supraclavicular adenopathy Both ankles are much less swollen than in the past, the patient is walking with a cane  Diagnostic Studies & Laboratory data:       Diagnosis: GMS stain demonstrates abundant microorganisms present within the hyalinized nodules with morphology consistent with Histoplasmosis capsulatum. 1. Lung, wedge biopsy/resection, Left lower lobe - INVASIVE ADENOCARCINOMA SEE COMMENT. - TUMOR INVOLVES SURGICAL MARGIN. - NO LYMPHOVASCULAR INVASION IDENTIFIED. - SEE TUMOR SYNOPTIC TEMPLATE BELOW. 2. Lymph node, biopsy, 8 node - ONE LYMPH NODE, NEGATIVE FOR TUMOR (0/1), SEE COMMENT. 3. Lymph node, biopsy, 11 L - ONE LYMPH NODE, NEGATIVE FOR TUMOR (0/1). 4. Lung, resection (segmental or lobe), Left lower - BENIGN LUNG, SEE COMMENT. - NEGATIVE FOR ATYPIA OR MALIGNANCY. - BRONCHOVASCULAR MARGIN, NEGATIVE FOR ATYPIA OR MALIGNANCY. 5. Lymph node, biopsy, 10 node - ONE LYMPH NODE, NEGATIVE FOR TUMOR (0/1). 6. Lymph node, biopsy, 12 node - ONE LYMPH NODE, NEGATIVE FOR TUMOR (0/1). Microscopic Comment 1. LUNG 1 of 3 FINAL for Susan Pittman, Susan Pittman ([id number]) Microscopic Comment(continued) Specimen, including laterality: Left lower lobe (parts 1 and 4). Procedure: Wedge resection and lobectomy Specimen integrity (intact/disrupted): Intact Tumor  site: Subpleural Maximum tumor size (cm): 1.7 cm Histologic type: Adenocarcinoma (acinar and lepidic subtypes) Grade: I/well differentiated Margins: Present at margins, see comment. Distance to closest margin (cm): Present at margin Visceral pleura invasion: Absent Tumor extension: Tumor is confined to subpleural parenchyma Treatment effect (if treated with neoadjuvant therapy): None Lymph -Vascular invasion: Absent Lymph nodes: Number examined - 4; Number N1 nodes positive - 0; Number N2 nodes positive - 0 TNM code: pT1a, pN0, pMX Ancillary studies: Can be performed upon request Non-neoplastic lung: See part 4. Comments: The final surgical margin is part 4. 2. Sections of lymph node demonstrate multiple foci of extensively hyalinized and calcified intranodal nodules. AFB, PAS  and GMS stains are pending and will be reported in an addendum. 4. There is no tumor grossly identified. Representative sections of the lung demonstrate non-neoplastic findings to include minimal chronic interstitial inflammation with rare epithelioid granuloma, anthracotic pigment deposition, minimal peribronchiolar chronic inflammation, Langerhans cell histiocytic nodules, and an incidental 3 mm subpleural minute pulmonary meningothelial-like nodule (chemodectoma). The chemodectoma is an incidental, benign finding of no prognostic significance. There are no features of epithelial dysplasia or malignancy present. (CR:kh 12-16-13) [place] RUND DO    Recent Radiology Findings Ct Chest Wo Contrast  02/20/2015   CLINICAL DATA:  Left lung cancer restaging.  EXAM: CT CHEST WITHOUT CONTRAST  TECHNIQUE: Multidetector CT imaging of the chest was performed following the standard protocol without IV contrast.  COMPARISON:  08/19/2014  FINDINGS: Mediastinum: The heart size is normal. There is no pericardial effusion. Calcified atherosclerotic plaque involves the thoracic aorta. The trachea appears patent and is midline. Normal  appearance of the esophagus. No enlarged mediastinal or hilar lymph nodes identified.  Lungs/Pleura: There is no pleural effusion identified. There is no airspace consolidation identified. Right upper lobe pulmonary nodule measures 4 mm, image 20/series 5. This is stable from previous exam. Right apical nodule is stable measuring 3 mm, image 11/series 5. Postoperative change in volume loss involving the left hemi thorax identified compatible with prior left lower lobe lobectomy. Faint nodule in the left midlung is unchanged measuring 2-3 mm, image 29/series 5.  Upper Abdomen: The adrenal glands are both normal. The visualized portions of the liver and spleen are unremarkable. Normal appearance of the pancreas.  Musculoskeletal: Review of the visualized osseous structures is negative for aggressive lytic or sclerotic bone lesion.  IMPRESSION: 1. No acute findings. No specific features identified to suggest recurrence of tumor status post left lower lobectomy. 2. Stable small nonspecific nodules.   Electronically Signed   By: Kerby Moors M.D.   On: 02/20/2015 09:34   Dg Tibia/fibula Right  08/25/2014   CLINICAL DATA:  Foot pain and swelling starting this morning. Bilateral ankle pain and swelling. Difficulty standing. Diabetes.  EXAM: RIGHT TIBIA AND FIBULA - 2 VIEW  COMPARISON:  None.  FINDINGS: Medial compartmental loss of articular space and marginal spurring in the knee. Mild patellar spurring.  No acute bony abnormality of the tibia or fibula noted. Plantar calcaneal spur.  IMPRESSION: 1. Osteoarthritis of the knee.  No acute bony findings.   Electronically Signed   By: Sherryl Barters M.D.   On: 08/25/2014 15:05   Ct Chest Wo Contrast  08/19/2014   CLINICAL DATA:  Left lower lobe lung cancer diagnosed 1/15. Surgery only. Restaging.Lung cancer, main bronchus, left C34.02 (ICD-10-CM) Malignant neoplasm of lower lobe of left lung C34.32 (ICD-10-CM)  EXAM: CT CHEST WITHOUT CONTRAST  TECHNIQUE:  Multidetector CT imaging of the chest was performed following the standard protocol without IV contrast.  COMPARISON:  06/13/2014  FINDINGS: Lungs/Pleura: Nodularity along the non dependent trachea, including on images 7 through 11. Similar in configuration to on the prior exam, and back to 11/05/2013  Surgical changes of left lower lobectomy.  Mild centrilobular emphysema. 2 mm right apical lung nodule on image 9 is unchanged.  Central superior segment right lower lobe ground-glass opacity on image 28 of series 5. This measures 2.7 cm and is favored to represent an area of volume loss/ atelectasis. This is new.  Subpleural posterior right upper lobe 2 mm nodule is unchanged on image 18.  Left-sided pleural thickening is similar with resolution  of trace fluid.  Heart/Mediastinum: No supraclavicular adenopathy. Aortic and branch vessel atherosclerosis. Mild cardiomegaly. No pericardial effusion. No mediastinal or definite hilar adenopathy, given limitations of unenhanced CT. Small hiatal hernia. Left hemidiaphragm elevation.  Upper Abdomen: Cholecystectomy. Normal imaged portions of the liver, spleen, pancreas, adrenal glands. Cholecystectomy. Too small to characterize upper pole left renal lesion, most likely a cyst. This measures approximately 8 mm.  Bones/Musculoskeletal:  No acute osseous abnormality.  IMPRESSION: 1. Status post left lower lobectomy, without evidence of recurrent or metastatic disease. 2. Persistent left pleural thickening. 3. Similar indeterminate pulmonary nodules. 4. Nodularity along the non dependent trachea. Given presence over prior exams, suspicious for tracheal polyps. Recommend attention on follow-up. 5. A new area of ground-glass opacity within the central right lower lobe. Favored to represent subsegmental atelectasis. Recommend attention on follow-up.   Electronically Signed   By: Abigail Miyamoto M.D.   On: 08/19/2014 13:18   Ct Chest Wo Contrast  02/17/2014   CLINICAL DATA:  Lung  infiltrates. History of lung carcinoma in January 2015 with a left lower lobectomy. Short of breath.  EXAM: CT CHEST WITHOUT CONTRAST  TECHNIQUE: Multidetector CT imaging of the chest was performed following the standard protocol without IV contrast.  COMPARISON:  Current chest radiograph.  Chest CT, 11/05/2013  FINDINGS: There are small right and minimal left pleural effusions. There is consolidation in coarse reticular opacity in the remaining left upper lobe in the posterior lung base. This may all be atelectasis. Pneumonia should be considered likely in the proper clinical setting.  There are 2 small stable nodular densities in the right lung, most likely benign granulomas, scarring or a combination. Mild stable scarring is noted in the anterior medial right lung base. This subsegmental atelectasis at the posterior right lung base. No pulmonary edema. No discrete left lung nodules are seen. There is volume loss on the left from the left lower lobectomy.  There is new mild mediastinal adenopathy. A 1 cm short axis node lies in the precarinal region where it had measures 6.5 mm. There are several prevascular nodes that are prominent. There is a 7 mm node just superior to the main pulmonary artery. This measured 3 mm previously. There is a 9.4 mm right supraclavicular node which previously measured 1 mm smaller. There is and 6 mm left supraclavicular another which measured 4 mm previously.  Heart is normal in size and configuration.  Limited evaluation of the upper abdomen shows node with a or adrenal masses.  The bony thorax is demineralized. There are degenerative changes along the visualized spine. No osteoblastic or osteolytic lesions.  ri IMPRESSION: 1. There is focal opacity with associated linear and coarse reticular opacity in the posterior lower aspect of the remaining left lung. Although this could be atelectasis, pneumonia is suspected. There is a minimal associated left pleural effusion. 2. There are  prominent mediastinal lymph nodes which have increased from the prior study. This could reflect metastatic disease. The nodes may be reactive to the lower lung zone pneumonia. There are prominent supraclavicular nodes that have increased in size as well. No other evidence suggesting metastatic disease. 3. Smallght effusion.  No evidence of a right lung infiltrate. 4. To small right lung nodules are without significant change in most likely benign.   Electronically Signed   By: Lajean Manes M.D.    Recent Labs: Lab Results  Component Value Date   WBC 7.3 02/20/2015   HGB 11.7 02/20/2015   HCT 37.8 02/20/2015  PLT 272 02/20/2015   GLUCOSE 97 02/20/2015   CHOL 262* 03/18/2014   TRIG 307.0* 03/18/2014   HDL 67.40 03/18/2014   LDLDIRECT 126.3 09/10/2012   LDLCALC 133* 03/18/2014   ALT 11 02/20/2015   AST 15 02/20/2015   NA 147* 02/20/2015   K 3.8 02/20/2015   CL 99 08/25/2014   CREATININE 1.9* 02/20/2015   BUN 48.2* 02/20/2015   CO2 30* 02/20/2015   TSH 0.69 02/22/2015   INR 1.01 02/25/2014   HGBA1C 5.8 01/03/2015    Assessment / Plan:    renal bx done currently on steroids for IGA GLOMERULONEPHRITIS WITH FOCAL MILD ACTIVITY- improving on decreased dose of steroids Plan to see her back again in 6 months for followup of her stage I carcinoma of the lung/resected- follow-up CT scans show no evidence of recurrence Overall the patient feels better than on her previous visit, her mobility has improved   Grace Isaac MD      [address] [place] [address] Office [phone]   [facility]eeper [phone]

## 2015-03-14 ENCOUNTER — Encounter: Payer: Self-pay | Admitting: Family Medicine

## 2015-03-20 ENCOUNTER — Encounter: Payer: Self-pay | Admitting: General Practice

## 2015-03-21 ENCOUNTER — Telehealth: Payer: Self-pay | Admitting: General Practice

## 2015-03-21 ENCOUNTER — Encounter: Payer: Self-pay | Admitting: General Practice

## 2015-03-21 NOTE — Telephone Encounter (Signed)
Called pt in regards to last bone density scan. Provider recommendations were to begin Fosamax '70mg'$  daily. Pt advised that her dentist told her to stop this medication years ago. Please advise?

## 2015-03-22 NOTE — Telephone Encounter (Signed)
Pt's bone density was sent to scanning- unable to review exact numbers at this time to determine if Fosamax is needed or if Ca and Vit D supplementation is enough.

## 2015-03-22 NOTE — Telephone Encounter (Signed)
Pt notified and advised to take 1200 of the calcium and 2 of the vitamin d.

## 2015-04-04 ENCOUNTER — Ambulatory Visit (HOSPITAL_COMMUNITY): Payer: Medicare Other | Attending: Cardiovascular Disease

## 2015-04-04 ENCOUNTER — Encounter: Payer: Self-pay | Admitting: Cardiovascular Disease

## 2015-04-04 ENCOUNTER — Ambulatory Visit (INDEPENDENT_AMBULATORY_CARE_PROVIDER_SITE_OTHER): Payer: Medicare Other | Admitting: Cardiovascular Disease

## 2015-04-04 VITALS — BP 142/68 | HR 60 | Ht 64.0 in | Wt 232.4 lb

## 2015-04-04 DIAGNOSIS — I2510 Atherosclerotic heart disease of native coronary artery without angina pectoris: Secondary | ICD-10-CM

## 2015-04-04 DIAGNOSIS — I5032 Chronic diastolic (congestive) heart failure: Secondary | ICD-10-CM | POA: Diagnosis not present

## 2015-04-04 DIAGNOSIS — I10 Essential (primary) hypertension: Secondary | ICD-10-CM | POA: Diagnosis not present

## 2015-04-04 DIAGNOSIS — I701 Atherosclerosis of renal artery: Secondary | ICD-10-CM | POA: Insufficient documentation

## 2015-04-04 NOTE — Patient Instructions (Signed)
Medication Instructions:  Your physician recommends that you continue on your current medications as directed. Please refer to the Current Medication list given to you today.  Labwork: No new orders  Testing/Procedures: Your physician has requested that you have a renal artery duplex in 1 YEAR. During this test, an ultrasound is used to evaluate blood flow to the kidneys. Allow one hour for this exam. Do not eat after midnight the day before and avoid carbonated beverages. Take your medications as you usually do.  Follow-Up: Your physician wants you to follow-up in: 1 YEAR with Dr Fletcher Anon. You will receive a reminder letter in the mail two months in advance. If you don't receive a letter, please call our office to schedule the follow-up appointment.   Any Other Special Instructions Will Be Listed Below (If Applicable).

## 2015-04-04 NOTE — Assessment & Plan Note (Signed)
She has no symptoms of angina. 

## 2015-04-04 NOTE — Assessment & Plan Note (Signed)
She is doing reasonably well with no evidence of obstructive in-stent restenosis. I will obtain a follow-up renal artery duplex in 1 year.

## 2015-04-04 NOTE — Progress Notes (Signed)
HPI  [name] is a 74 year old female who is here for a follow up visit regarding renal artery stenting.  She has a long-standing history of hypertension for at least 30-40 years. She initially was treated with diuretic therapy.  She has a history of hypothyroidism, remote peptic ulcer disease in the 1980s, as well as GERD.  As part of her workup for hypertension, she was found to have significant renal artery stenosis.  On 11/08/2013 she underwent stenting of her right renal artery by Dr. Gwenlyn Found after a renal Doppler suggested hemodynamically significant right renal artery stenosis.  She subsequently presented to the hospital with chest pain and on 12/12/2013 underwent cardiac catheterization .  She was found to have mild nonobstructive CAD with smooth 20% RCA narrowing.  Ejection fraction was 65%.   She was found to have lung nodules and on 12/15/2013 underwent VATS and mini thoracotomy with lobectomy of the left lower lobe with lymph node dissection for adenocarcinoma.  She continued to have leg edema and worsening chronic kidney disease. She underwent kidney biopsy which showed evidence of IgA nephropathy. She was started on high-dose prednisone. The dose has been tapered down. Renal function has been stable. She denies any chest pain or shortness of breath. Renal artery duplex today showed mild in-stent restenosis with no obstructive disease on the right side. The left renal artery was normal.   Allergies  Allergen Reactions  . Benazepril Hcl Anaphylaxis and Cough  . Loratadine Anaphylaxis    anaphylaxis  . Allegra [Fexofenadine Hcl] Other (See Comments)    Severe back pain  . Celecoxib Other (See Comments)    REACTION: aching  . Lisinopril     REACTION: cough  . Sulfa Antibiotics Hives     Current Outpatient Prescriptions on File Prior to Visit  Medication Sig Dispense Refill  . albuterol (PROVENTIL HFA;VENTOLIN HFA) 108 (90 BASE) MCG/ACT inhaler Inhale 1-2 puffs into the  lungs every 6 (six) hours as needed for wheezing or shortness of breath. 18 g 1  . allopurinol (ZYLOPRIM) 100 MG tablet Take 100 mg by mouth 2 (two) times daily. Takes 2 tablets in am    . Calcium Carb-Cholecalciferol 600-800 MG-UNIT TABS Take 2 tablets by mouth daily.     . cetirizine (ZYRTEC) 10 MG tablet Take 10 mg by mouth at bedtime.     . colchicine 0.6 MG tablet Take 0.6 mg by mouth every other day.     . DULERA 100-5 MCG/ACT AERO Inhale 2 puffs into the lungs 2 (two) times daily as needed for shortness of breath.     . Ferrous Sulfate (IRON SUPPLEMENT PO) Take 150 mg by mouth 2 (two) times daily.    . fluticasone (FLONASE) 50 MCG/ACT nasal spray USE TWO SPRAY IN EACH NOSTRIL EVERY DAY 16 g 6  . furosemide (LASIX) 40 MG tablet 40 mg. Takes  '40mg'$  in the pm until weight of 219 is reached. *( and 80 mg in am)    . furosemide (LASIX) 80 MG tablet Take 80 mg by mouth every morning.     Marland Kitchen glucose blood (ONE TOUCH ULTRA TEST) test strip Pt to test 3-4x daily due to labile sugars Dx. 250.02 100 each 12  . Lancets (ONETOUCH ULTRASOFT) lancets Use 1 per day dx code 249.00 100 each 12  . lansoprazole (PREVACID) 15 MG capsule Take 15 mg by mouth daily at 12 noon.    Marland Kitchen levothyroxine (SYNTHROID, LEVOTHROID) 88 MCG tablet Take 1 tablet (88 mcg  total) by mouth daily before breakfast. 90 tablet 1  . Nebivolol HCl 20 MG TABS Take 1 tablet (20 mg total) by mouth daily. 30 tablet 6  . ONETOUCH DELICA LANCETS 26R MISC Use to check blood sugar once daily as instructed. Dx code: E11.9 100 each 2  . polyethylene glycol powder (GLYCOLAX/MIRALAX) powder Take 17 g by mouth daily. 850 g 3  . predniSONE (DELTASONE) 5 MG tablet Take 5 mg by mouth daily with breakfast.      No current facility-administered medications on file prior to visit.     Past Medical History  Diagnosis Date  . Asthma   . Hypertension   . GERD (gastroesophageal reflux disease)   . Allergy   . Hypothyroid   . Osteoporosis   . Gastric  ulcer   . Hiatal hernia   . Hyperlipidemia     "don't take RX for it" (11/08/2013)  . Renal artery stenosis     with stent placement  . Lung mass     superior segment LLL/notes 11/08/2013  . Pneumonia     "I've had it ?3 times" (11/08/2013)  . Arthritis     "knees, hips, back, hands" (11/08/2013)  . DJD (degenerative joint disease)   . Lung nodule/ adenoca > LLobectomy 12/15/13  11/08/2013    PET 11/23/2013 1. Dominant sub solid left lower lobe nodule does not demonstrate significantly increased metabolic activity. However, morphologically, adenocarcinoma is still a concern.   - Spirometry 11/24/13 wnl  - 11/24/2013 T surg eval rec> LLower lobectomy 12/15/2013 for adenoca   . CAD (coronary artery disease), non obstructive by cardiac cath 12/12/13 01/24/2014  . Basal cell carcinoma     "cut them off face & right shoulder" (11/08/2013)  . Diastolic CHF   . CKD (chronic kidney disease), stage III   . Nephropathy      Past Surgical History  Procedure Laterality Date  . Knee arthroscopy Bilateral   . Anterior cervical decomp/discectomy fusion  2000    C5-C6  . Renal artery stent Right 11/08/2013    Archie Endo 11/08/2013  . Tonsillectomy and adenoidectomy  ?1946  . Cholecystectomy  1970's  . Vaginal hysterectomy  1970  . Dilation and curettage of uterus  1960's  . Skin cancer excision    . Coronary angiogram      Hx: of 2/ 2015  . Cardiac catheterization      Hx: of 2/ 2015  . Video bronchoscopy N/A 12/15/2013    Procedure: VIDEO BRONCHOSCOPY;  Surgeon: Grace Isaac, MD;  Location: [facility] OR;  Service: Thoracic;  Laterality: N/A;  . Video assisted thoracoscopy (vats)/wedge resection Left 12/15/2013    Procedure: VIDEO ASSISTED THORACOSCOPY (VATS)/WEDGE RESECTION;  Surgeon: Grace Isaac, MD;  Location: [place];  Service: Thoracic;  Laterality: Left;  . Lobectomy Left 12/15/2013    Procedure: LOBECTOMY;  Surgeon: Grace Isaac, MD;  Location: [place];  Service: Thoracic;  Laterality: Left;  . Renal  angiogram Bilateral 11/08/2013    Procedure: RENAL ANGIOGRAM;  Surgeon: Lorretta Harp, MD;  Location: [facility] CATH LAB;  Service: Cardiovascular;  Laterality: Bilateral;  . Percutaneous stent intervention Right 11/08/2013    Procedure: PERCUTANEOUS STENT INTERVENTION;  Surgeon: Lorretta Harp, MD;  Location: [facility] CATH LAB;  Service: Cardiovascular;  Laterality: Right;  rt renal artery stent  . Left heart catheterization with coronary angiogram N/A 12/13/2013    Procedure: LEFT HEART CATHETERIZATION WITH CORONARY ANGIOGRAM;  Surgeon: Burnell Blanks, MD;  Location: [facility]  CATH LAB;  Service: Cardiovascular;  Laterality: N/A;     Family History  Problem Relation Age of Onset  . Breast cancer Sister   . Emphysema Father     smoked  . Allergies Sister   . Allergies Brother   . Heart disease Mother   . Heart disease Father   . Diabetes Father   . Diabetes Brother   . Stroke Father   . Heart failure Neg Hx      History   Social History  . Marital Status: Married    Spouse Name: N/A  . Number of Children: N/A  . Years of Education: N/A   Occupational History  . Retired    Social History Main Topics  . Smoking status: Never Smoker   . Smokeless tobacco: Never Used  . Alcohol Use: No  . Drug Use: No  . Sexual Activity: Yes   Other Topics Concern  . Not on file   Social History Narrative     PHYSICAL EXAM   BP 142/68 mmHg  Pulse 60  Ht '5\' 4"'$  (1.626 m)  Wt 232 lb 6.4 oz (105.416 kg)  BMI 39.87 kg/m2 Constitutional: She is oriented to person, place, and time. She appears well-developed and well-nourished. No distress.  HENT: No nasal discharge.  Head: Normocephalic and atraumatic.  Eyes: Pupils are equal and round. No discharge.  Neck: Normal range of motion. Neck supple. No JVD present. No thyromegaly present.  Cardiovascular: Normal rate, regular rhythm, normal heart sounds. Exam reveals no gallop and no friction rub. No murmur heard.  Pulmonary/Chest: Effort normal  and breath sounds normal. No stridor. No respiratory distress. She has no wheezes. She has no rales. She exhibits no tenderness.  Abdominal: Soft. Bowel sounds are normal. She exhibits no distension. There is no tenderness. There is no rebound and no guarding.  Musculoskeletal: Normal range of motion. She exhibits mild edema and no tenderness.  Neurological: She is alert and oriented to person, place, and time. Coordination normal.  Skin: Skin is warm and dry. No rash noted. She is not diaphoretic. No erythema. No pallor.  Psychiatric: She has a normal mood and affect. Her behavior is normal. Judgment and thought content normal.      ASSESSMENT AND PLAN

## 2015-04-04 NOTE — Assessment & Plan Note (Signed)
She appears to be euvolemic. 

## 2015-04-04 NOTE — Assessment & Plan Note (Signed)
Blood pressure is reasonably controlled on current medications. 

## 2015-04-07 ENCOUNTER — Encounter: Payer: Self-pay | Admitting: Family Medicine

## 2015-04-11 DIAGNOSIS — I129 Hypertensive chronic kidney disease with stage 1 through stage 4 chronic kidney disease, or unspecified chronic kidney disease: Secondary | ICD-10-CM | POA: Diagnosis not present

## 2015-04-11 DIAGNOSIS — N184 Chronic kidney disease, stage 4 (severe): Secondary | ICD-10-CM | POA: Diagnosis not present

## 2015-04-11 DIAGNOSIS — D649 Anemia, unspecified: Secondary | ICD-10-CM | POA: Diagnosis not present

## 2015-04-11 DIAGNOSIS — N028 Recurrent and persistent hematuria with other morphologic changes: Secondary | ICD-10-CM | POA: Diagnosis not present

## 2015-04-12 ENCOUNTER — Other Ambulatory Visit: Payer: Self-pay | Admitting: Internal Medicine

## 2015-04-13 ENCOUNTER — Other Ambulatory Visit: Payer: Self-pay

## 2015-04-13 MED ORDER — FUROSEMIDE 80 MG PO TABS: 80.0000 mg | ORAL_TABLET | Freq: Every morning | ORAL | Status: DC

## 2015-04-13 MED ORDER — FUROSEMIDE 40 MG PO TABS: ORAL_TABLET | ORAL | Status: DC

## 2015-04-13 NOTE — Telephone Encounter (Signed)
Per note 5.31.16

## 2015-04-18 ENCOUNTER — Encounter (HOSPITAL_COMMUNITY): Payer: PRIVATE HEALTH INSURANCE

## 2015-04-18 ENCOUNTER — Ambulatory Visit: Payer: PRIVATE HEALTH INSURANCE | Admitting: Cardiovascular Disease

## 2015-05-01 NOTE — Patient Outreach (Signed)
Susan Pittman Susan Pittman) Care Management  05/01/2015  Susan Pittman 23-Apr-1941 [id number]   Referral from Winnett List, assigned Susan Plowman, RN to outreach.  Susan Pittman. [place], [place] Management [place] Assistant Phone: [phone] Fax: [phone]

## 2015-05-25 ENCOUNTER — Other Ambulatory Visit: Payer: Self-pay

## 2015-05-25 NOTE — Patient Outreach (Signed)
[facility]) Care Management  05/25/2015  Susan Pittman 1941-07-18 [id number]  Telephone call to patient regarding high risk list referral.  Unable to reach patient. HIPAA compliant voice message left with call back phone number.   PLAN:  RNCM will attempt 2nd telephone outreach to patient.   Quinn Plowman RN,BSN,CCM [place] Coordinator [phone]

## 2015-06-03 ENCOUNTER — Other Ambulatory Visit: Payer: Self-pay | Admitting: Family Medicine

## 2015-06-05 DIAGNOSIS — I701 Atherosclerosis of renal artery: Secondary | ICD-10-CM | POA: Diagnosis not present

## 2015-06-05 DIAGNOSIS — D649 Anemia, unspecified: Secondary | ICD-10-CM | POA: Diagnosis not present

## 2015-06-05 DIAGNOSIS — I129 Hypertensive chronic kidney disease with stage 1 through stage 4 chronic kidney disease, or unspecified chronic kidney disease: Secondary | ICD-10-CM | POA: Diagnosis not present

## 2015-06-05 DIAGNOSIS — N184 Chronic kidney disease, stage 4 (severe): Secondary | ICD-10-CM | POA: Diagnosis not present

## 2015-06-05 NOTE — Telephone Encounter (Signed)
Medication filled to pharmacy as requested.   

## 2015-06-13 ENCOUNTER — Other Ambulatory Visit: Payer: Self-pay

## 2015-06-13 NOTE — Patient Outreach (Signed)
[facility]) Care Management  06/13/2015  Susan Pittman 22-Jul-1941 [id number]  Telephone outreach to patient regarding high risk list referral.  HIPAA verified with patient.  Discussed and offered [facility] care management services to patient. Patient refused services.  Patient verbally agreed to received [facility] outreach letter and pamphlet with contact information for future use.   PLAN: RNCM will send patient outreach letter and pamphlet RNCM will refer to [name] to close due to refusal of services. RNCM will notify patients primary MD of refusal of services.   Quinn Plowman RN,BSN,CCM [place] Coordinator [phone]

## 2015-06-20 NOTE — Patient Outreach (Signed)
[facility] [facility]) Care Management  06/13/2015  Susan Pittman December 17, 1940 [id number]   Notification from Quinn Plowman, RN to close case due to patient refused [place] Management services.  Thanks, Ronnell Freshwater. [place], [place] Assistant Phone: [phone] Fax: [phone]

## 2015-06-28 ENCOUNTER — Ambulatory Visit (INDEPENDENT_AMBULATORY_CARE_PROVIDER_SITE_OTHER): Payer: Medicare Other | Admitting: Family Medicine

## 2015-06-28 ENCOUNTER — Encounter: Payer: Self-pay | Admitting: Family Medicine

## 2015-06-28 VITALS — BP 138/88 | HR 63 | Temp 97.9°F | Resp 16 | Wt 237.2 lb

## 2015-06-28 DIAGNOSIS — R5383 Other fatigue: Secondary | ICD-10-CM | POA: Diagnosis not present

## 2015-06-28 DIAGNOSIS — I701 Atherosclerosis of renal artery: Secondary | ICD-10-CM | POA: Diagnosis not present

## 2015-06-28 DIAGNOSIS — F411 Generalized anxiety disorder: Secondary | ICD-10-CM | POA: Insufficient documentation

## 2015-06-28 DIAGNOSIS — I10 Essential (primary) hypertension: Secondary | ICD-10-CM

## 2015-06-28 LAB — CBC WITH DIFFERENTIAL/PLATELET
BASOS PCT: 0.4 % (ref 0.0–3.0)
Basophils Absolute: 0 10*3/uL (ref 0.0–0.1)
EOS PCT: 0.5 % (ref 0.0–5.0)
Eosinophils Absolute: 0.1 10*3/uL (ref 0.0–0.7)
HEMATOCRIT: 37.3 % (ref 36.0–46.0)
HEMOGLOBIN: 12.1 g/dL (ref 12.0–15.0)
LYMPHS PCT: 12.7 % (ref 12.0–46.0)
Lymphs Abs: 1.2 10*3/uL (ref 0.7–4.0)
MCHC: 32.3 g/dL (ref 30.0–36.0)
MCV: 86.4 fl (ref 78.0–100.0)
Monocytes Absolute: 0.4 10*3/uL (ref 0.1–1.0)
Monocytes Relative: 4 % (ref 3.0–12.0)
Neutro Abs: 7.8 10*3/uL — ABNORMAL HIGH (ref 1.4–7.7)
Neutrophils Relative %: 82.4 % — ABNORMAL HIGH (ref 43.0–77.0)
Platelets: 299 10*3/uL (ref 150.0–400.0)
RBC: 4.32 Mil/uL (ref 3.87–5.11)
RDW: 17 % — AB (ref 11.5–15.5)
WBC: 9.5 10*3/uL (ref 4.0–10.5)

## 2015-06-28 LAB — BASIC METABOLIC PANEL
BUN: 52 mg/dL — ABNORMAL HIGH (ref 6–23)
CALCIUM: 9.6 mg/dL (ref 8.4–10.5)
CO2: 33 meq/L — AB (ref 19–32)
Chloride: 99 mEq/L (ref 96–112)
Creatinine, Ser: 1.94 mg/dL — ABNORMAL HIGH (ref 0.40–1.20)
GFR: 26.62 mL/min — ABNORMAL LOW (ref >60.00)
Glucose, Bld: 113 mg/dL — ABNORMAL HIGH (ref 70–99)
Potassium: 4.3 mEq/L (ref 3.5–5.1)
SODIUM: 143 meq/L (ref 135–145)

## 2015-06-28 LAB — HEPATIC FUNCTION PANEL
ALT: 13 U/L (ref 0–35)
AST: 16 U/L (ref 0–37)
Albumin: 4.2 g/dL (ref 3.5–5.2)
Alkaline Phosphatase: 79 U/L (ref 39–117)
BILIRUBIN DIRECT: 0.1 mg/dL (ref 0.0–0.3)
BILIRUBIN TOTAL: 0.4 mg/dL (ref 0.2–1.2)
Total Protein: 6.9 g/dL (ref 6.0–8.3)

## 2015-06-28 LAB — TSH: TSH: 0.61 u[IU]/mL (ref 0.35–4.50)

## 2015-06-28 MED ORDER — SERTRALINE HCL 25 MG PO TABS: 25.0000 mg | ORAL_TABLET | Freq: Every day | ORAL | Status: DC

## 2015-06-28 NOTE — Progress Notes (Signed)
Pre visit review using our clinic review tool, if applicable. No additional management support is needed unless otherwise documented below in the visit note. 

## 2015-06-28 NOTE — Progress Notes (Signed)
   Subjective:    Patient ID: Susan Pittman, female    DOB: 04-25-1941, 74 y.o.   MRN: [id number]  HPI HTN- chronic problem.  Pt's BPs range from 113-162/73-83.  On Bystolic, Lasix, Hydralazine.  + HAs and blurry vision- appt w/ eye doctor pending.  Fatigue- 'i do not have any energy'.  Pt reports less activity than a few months ago.  Riding a recumbant bike 'periodically'.  No CP, intermittent SOB, N/V.  Pt reports sleeping well at night. Pt's prednisone was recently decreased to '4mg'$  and she has not felt well since this occurred.  Increased anxiety.   Review of Systems For ROS see HPI     Objective:   Physical Exam  Constitutional: She is oriented to person, place, and time. She appears well-developed and well-nourished. No distress.  HENT:  Head: Normocephalic and atraumatic.  Eyes: Conjunctivae and EOM are normal. Pupils are equal, round, and reactive to light.  Neck: Normal range of motion. Neck supple. No thyromegaly present.  Cardiovascular: Normal rate, regular rhythm, normal heart sounds and intact distal pulses.   No murmur heard. Pulmonary/Chest: Effort normal and breath sounds normal. No respiratory distress.  Abdominal: Soft. She exhibits no distension. There is no tenderness.  Musculoskeletal: She exhibits no edema.  Lymphadenopathy:    She has no cervical adenopathy.  Neurological: She is alert and oriented to person, place, and time.  Skin: Skin is warm and dry.  Psychiatric: She has a normal mood and affect. Her behavior is normal.  Vitals reviewed.         Assessment & Plan:

## 2015-06-28 NOTE — Patient Instructions (Signed)
Follow up in 4 weeks to recheck mood We'll notify you of your lab results and make any changes if need (I'll update Dr Lorrene Reid) Start the Zoloft once daily Ease back into exercise- just don't overdo it!! Call with any questions or concerns You've got this!!!

## 2015-07-03 NOTE — Assessment & Plan Note (Signed)
Adequate control today.  No med changes at this time but will continue to follow closely.

## 2015-07-03 NOTE — Assessment & Plan Note (Signed)
New.  Pt is having high anxiety in the setting of her fatigue.  She fears that there is another medical issue waiting for her after all that she has been through.  I suspect that her fatigue is from weaning her prednisone and overdoing her exercise at the gym and I discussed this w/ both she and her husband.  With all of her serious medical issues in the last 2 yrs, she has not had time to recover and process all that she's been through and I think some of the depression/anxiety is catching up with her.  We discussed this at length and based on her feelings of being overwhelmed, will start low dose Zoloft and monitor closely for improvement.

## 2015-07-03 NOTE — Assessment & Plan Note (Signed)
Recurrent problem for pt.  Suspect this is due to pt weaning her prednisone and attempting to overdue it in the gym.  I also think her anxiety and some level of depression are contributing.  Check labs to r/o underlying metabolic cause for fatigue.  Will follow closely.

## 2015-07-07 ENCOUNTER — Encounter: Payer: Self-pay | Admitting: Endocrinology

## 2015-07-07 ENCOUNTER — Ambulatory Visit (INDEPENDENT_AMBULATORY_CARE_PROVIDER_SITE_OTHER): Payer: Medicare Other | Admitting: Endocrinology

## 2015-07-07 VITALS — BP 140/88 | HR 65 | Temp 98.5°F | Ht 64.0 in | Wt 238.0 lb

## 2015-07-07 DIAGNOSIS — N028 Recurrent and persistent hematuria with other morphologic changes: Secondary | ICD-10-CM | POA: Diagnosis not present

## 2015-07-07 DIAGNOSIS — E119 Type 2 diabetes mellitus without complications: Secondary | ICD-10-CM

## 2015-07-07 DIAGNOSIS — I701 Atherosclerosis of renal artery: Secondary | ICD-10-CM

## 2015-07-07 LAB — POCT GLYCOSYLATED HEMOGLOBIN (HGB A1C): Hemoglobin A1C: 5.6

## 2015-07-07 NOTE — Patient Instructions (Addendum)
You don't need any medication for the diabetes now--good. Please come back for a follow-up appointment in 6 months.  However, please come back sooner if the prednisone has to be increased again.  You can back off to checking the blood sugar twice a week or so. good diet and exercise significantly improve the control of your diabetes.  please let me know if you wish to be referred to a dietician.  high blood sugar is very risky  to your health.  you should see an eye doctor and dentist every year.  It is very important to get all recommended vaccinations.

## 2015-07-07 NOTE — Progress Notes (Signed)
Subjective:    Patient ID: Susan Pittman, female    DOB: [birth year], 74 y.o.   MRN: [id number]  HPI Pt returns for f/u of diabetes mellitus: DM type: 2 Dx'ed: 2015, after she was started on prednisone for IgA nephropathy.   Complications: none (renal failure is due to IgA nephropathy) Therapy: now none.  GDM: never DKA: never Severe hypoglycemia: never Pancreatitis: never Other: prednisone is still being tapered; she has been off repaglinide since early 2016; renal failure limits oral rx options Interval history: she brings a record of her cbg's which i have reviewed today.  It varies from 85-102.  There is no trend throughout the day.   Past Medical History  Diagnosis Date  . Asthma   . Hypertension   . GERD (gastroesophageal reflux disease)   . Allergy   . Hypothyroid   . Osteoporosis   . Gastric ulcer   . Hiatal hernia   . Hyperlipidemia     "don't take RX for it" (11/08/2013)  . Renal artery stenosis     with stent placement  . Lung mass     superior segment LLL/notes 11/08/2013  . Pneumonia     "I've had it ?3 times" (11/08/2013)  . Arthritis     "knees, hips, back, hands" (11/08/2013)  . DJD (degenerative joint disease)   . Lung nodule/ adenoca > LLobectomy 12/15/13  11/08/2013    PET 11/23/2013 1. Dominant sub solid left lower lobe nodule does not demonstrate significantly increased metabolic activity. However, morphologically, adenocarcinoma is still a concern.   - Spirometry 11/24/13 wnl  - 11/24/2013 T surg eval rec> LLower lobectomy 12/15/2013 for adenoca   . CAD (coronary artery disease), non obstructive by cardiac cath 12/12/13 01/24/2014  . Basal cell carcinoma     "cut them off face & right shoulder" (11/08/2013)  . Diastolic CHF   . CKD (chronic kidney disease), stage III   . Nephropathy     Past Surgical History  Procedure Laterality Date  . Knee arthroscopy Bilateral   . Anterior cervical decomp/discectomy fusion  2000    C5-C6  . Renal artery stent Right 11/08/2013     Archie Endo 11/08/2013  . Tonsillectomy and adenoidectomy  ?1946  . Cholecystectomy  1970's  . Vaginal hysterectomy  1970  . Dilation and curettage of uterus  1960's  . Skin cancer excision    . Coronary angiogram      Hx: of 2/ 2015  . Cardiac catheterization      Hx: of 2/ 2015  . Video bronchoscopy N/A 12/15/2013    Procedure: VIDEO BRONCHOSCOPY;  Surgeon: Grace Isaac, MD;  Location: [facility] OR;  Service: Thoracic;  Laterality: N/A;  . Video assisted thoracoscopy (vats)/wedge resection Left 12/15/2013    Procedure: VIDEO ASSISTED THORACOSCOPY (VATS)/WEDGE RESECTION;  Surgeon: Grace Isaac, MD;  Location: [place];  Service: Thoracic;  Laterality: Left;  . Lobectomy Left 12/15/2013    Procedure: LOBECTOMY;  Surgeon: Grace Isaac, MD;  Location: [place];  Service: Thoracic;  Laterality: Left;  . Renal angiogram Bilateral 11/08/2013    Procedure: RENAL ANGIOGRAM;  Surgeon: Lorretta Harp, MD;  Location: [facility] CATH LAB;  Service: Cardiovascular;  Laterality: Bilateral;  . Percutaneous stent intervention Right 11/08/2013    Procedure: PERCUTANEOUS STENT INTERVENTION;  Surgeon: Lorretta Harp, MD;  Location: [facility] CATH LAB;  Service: Cardiovascular;  Laterality: Right;  rt renal artery stent  . Left heart catheterization with coronary angiogram N/A 12/13/2013  Procedure: LEFT HEART CATHETERIZATION WITH CORONARY ANGIOGRAM;  Surgeon: Burnell Blanks, MD;  Location: [facility] CATH LAB;  Service: Cardiovascular;  Laterality: N/A;    Social History   Social History  . Marital Status: Married    Spouse Name: N/A  . Number of Children: N/A  . Years of Education: N/A   Occupational History  . Retired    Social History Main Topics  . Smoking status: Never Smoker   . Smokeless tobacco: Never Used  . Alcohol Use: No  . Drug Use: No  . Sexual Activity: Yes   Other Topics Concern  . Not on file   Social History Narrative    Current Outpatient Prescriptions on File Prior to Visit    Medication Sig Dispense Refill  . albuterol (PROVENTIL HFA;VENTOLIN HFA) 108 (90 BASE) MCG/ACT inhaler Inhale 1-2 puffs into the lungs every 6 (six) hours as needed for wheezing or shortness of breath. 18 g 1  . allopurinol (ZYLOPRIM) 100 MG tablet Take 100 mg by mouth 2 (two) times daily. Takes 2 tablets in am    . Calcium Carb-Cholecalciferol 600-800 MG-UNIT TABS Take 2 tablets by mouth daily.     . cetirizine (ZYRTEC) 10 MG tablet Take 10 mg by mouth at bedtime.     . colchicine 0.6 MG tablet Take 0.6 mg by mouth. Every Mon, Wed, and Friday    . Ferrous Sulfate (IRON SUPPLEMENT PO) Take 150 mg by mouth 2 (two) times daily.    . fluticasone (FLONASE) 50 MCG/ACT nasal spray USE TWO SPRAY IN EACH NOSTRIL EVERY DAY 16 g 6  . furosemide (LASIX) 40 MG tablet Take  40 mg in the pm until weight of 219 is reached. 90 tablet 1  . furosemide (LASIX) 80 MG tablet Take 1 tablet (80 mg total) by mouth every morning. 90 tablet 1  . glucose blood (ONE TOUCH ULTRA TEST) test strip Pt to test 3-4x daily due to labile sugars Dx. 250.02 100 each 12  . hydrALAZINE (APRESOLINE) 50 MG tablet Take 50 mg by mouth 2 (two) times daily.    . Lancets (ONETOUCH ULTRASOFT) lancets Use 1 per day dx code 249.00 100 each 12  . lansoprazole (PREVACID) 15 MG capsule Take 15 mg by mouth daily at 12 noon.    Marland Kitchen levothyroxine (SYNTHROID, LEVOTHROID) 88 MCG tablet TAKE ONE TABLET (88 MG TOTAL) BY MOUTH ONCE DAILY BEFORE BREAKFAST 90 tablet 1  . Nebivolol HCl 20 MG TABS Take 1 tablet (20 mg total) by mouth daily. 30 tablet 6  . ONETOUCH DELICA LANCETS 06C MISC Use to check blood sugar once daily as instructed. Dx code: E11.9 100 each 2  . polyethylene glycol powder (GLYCOLAX/MIRALAX) powder Take 17 g by mouth daily. 850 g 3  . PREDNISONE PO Take 4 mg by mouth daily.    . sertraline (ZOLOFT) 25 MG tablet Take 1 tablet (25 mg total) by mouth daily. 30 tablet 3   No current facility-administered medications on file prior to visit.     Allergies  Allergen Reactions  . Benazepril Hcl Anaphylaxis and Cough  . Loratadine Anaphylaxis    anaphylaxis  . Allegra [Fexofenadine Hcl] Other (See Comments)    Severe back pain  . Celecoxib Other (See Comments)    REACTION: aching  . Lisinopril     REACTION: cough  . Sulfa Antibiotics Hives    Family History  Problem Relation Age of Onset  . Breast cancer Sister   . Emphysema Father  smoked  . Allergies Sister   . Allergies Brother   . Heart disease Mother   . Heart disease Father   . Diabetes Father   . Diabetes Brother   . Stroke Father   . Heart failure Neg Hx     BP 140/88 mmHg  Pulse 65  Temp(Src) 98.5 F (36.9 C) (Oral)  Ht '5\' 4"'$  (1.626 m)  Wt 238 lb (107.956 kg)  BMI 40.83 kg/m2  SpO2 94%  Review of Systems She denies hypoglycemia.  She has weight gain.     Objective:   Physical Exam VITAL SIGNS:  See vs page GENERAL: no distress Pulses: dorsalis pedis intact bilat.   MSK: no deformity of the feet  CV: no leg edema. Few bilat varicosities  Skin: no ulcer on the feet. normal color and temp on the feet.  Neuro: sensation is intact to touch on the feet   A1c=5.6%    Assessment & Plan:  DM: no medication is needed now.   Obesity: worse.  Patient is advised the following: Patient Instructions  You don't need any medication for the diabetes now--good. Please come back for a follow-up appointment in 6 months.  However, please come back sooner if the prednisone has to be increased again.  You can back off to checking the blood sugar twice a week or so. good diet and exercise significantly improve the control of your diabetes.  please let me know if you wish to be referred to a dietician.  high blood sugar is very risky  to your health.  you should see an eye doctor and dentist every year.  It is very important to get all recommended vaccinations.

## 2015-07-18 DIAGNOSIS — E119 Type 2 diabetes mellitus without complications: Secondary | ICD-10-CM | POA: Diagnosis not present

## 2015-07-18 DIAGNOSIS — H04123 Dry eye syndrome of bilateral lacrimal glands: Secondary | ICD-10-CM | POA: Diagnosis not present

## 2015-07-18 DIAGNOSIS — H524 Presbyopia: Secondary | ICD-10-CM | POA: Diagnosis not present

## 2015-07-18 DIAGNOSIS — H25813 Combined forms of age-related cataract, bilateral: Secondary | ICD-10-CM | POA: Diagnosis not present

## 2015-07-19 ENCOUNTER — Ambulatory Visit (INDEPENDENT_AMBULATORY_CARE_PROVIDER_SITE_OTHER): Payer: Medicare Other | Admitting: Family Medicine

## 2015-07-19 ENCOUNTER — Other Ambulatory Visit: Payer: Self-pay | Admitting: General Practice

## 2015-07-19 ENCOUNTER — Encounter: Payer: Self-pay | Admitting: Family Medicine

## 2015-07-19 VITALS — BP 138/86 | HR 64 | Temp 98.6°F | Resp 16 | Wt 242.2 lb

## 2015-07-19 DIAGNOSIS — N390 Urinary tract infection, site not specified: Secondary | ICD-10-CM

## 2015-07-19 DIAGNOSIS — R1084 Generalized abdominal pain: Secondary | ICD-10-CM | POA: Diagnosis not present

## 2015-07-19 DIAGNOSIS — R82998 Other abnormal findings in urine: Secondary | ICD-10-CM

## 2015-07-19 DIAGNOSIS — I701 Atherosclerosis of renal artery: Secondary | ICD-10-CM

## 2015-07-19 LAB — POCT URINALYSIS DIPSTICK
BILIRUBIN UA: NEGATIVE
Blood, UA: NEGATIVE
GLUCOSE UA: NEGATIVE
NITRITE UA: NEGATIVE
PH UA: 5.5
Protein, UA: NEGATIVE
Spec Grav, UA: 1.025
Urobilinogen, UA: 0.2

## 2015-07-19 MED ORDER — CEPHALEXIN 500 MG PO CAPS: 500.0000 mg | ORAL_CAPSULE | Freq: Two times a day (BID) | ORAL | Status: DC

## 2015-07-19 NOTE — Progress Notes (Signed)
Pre visit review using our clinic review tool, if applicable. No additional management support is needed unless otherwise documented below in the visit note. 

## 2015-07-19 NOTE — Patient Instructions (Signed)
Follow up as scheduled We'll notify you of your urine results and make any changes if needed Increase your fluid intake to flush the system Call with any questions or concerns Hang in there!!!

## 2015-07-19 NOTE — Assessment & Plan Note (Signed)
New.  Pt's sxs and UA consistent w/ infxn.  Start abx.  Await culture and sensitivities.  Adjust meds prn.

## 2015-07-19 NOTE — Progress Notes (Signed)
   Subjective:    Patient ID: Susan Pittman, female    DOB: April 23, 1941, 74 y.o.   MRN: [id number]  HPI UTI- 'i didn't feel good Monday'.  Yesterday developed suprapubic pressure.  Increased frequency.  Denies dysuria.  + urgency.  Denies hesitancy or incomplete emptying.  No fevers.  Mild back pain.   Review of Systems For ROS see HPI     Objective:   Physical Exam  Constitutional: She is oriented to person, place, and time. She appears well-developed and well-nourished. No distress.  Abdominal: Soft. She exhibits no distension. There is tenderness (mild suprapubic but no CVA tenderness ).  Neurological: She is alert and oriented to person, place, and time.  Skin: Skin is warm and dry.  Vitals reviewed.         Assessment & Plan:

## 2015-07-20 LAB — URINE CULTURE

## 2015-07-24 ENCOUNTER — Ambulatory Visit (INDEPENDENT_AMBULATORY_CARE_PROVIDER_SITE_OTHER): Payer: Medicare Other | Admitting: Family Medicine

## 2015-07-24 ENCOUNTER — Encounter: Payer: Self-pay | Admitting: Family Medicine

## 2015-07-24 VITALS — BP 136/84 | HR 63 | Temp 98.3°F | Resp 16 | Ht 64.0 in | Wt 238.4 lb

## 2015-07-24 DIAGNOSIS — F411 Generalized anxiety disorder: Secondary | ICD-10-CM

## 2015-07-24 DIAGNOSIS — I701 Atherosclerosis of renal artery: Secondary | ICD-10-CM

## 2015-07-24 NOTE — Patient Instructions (Signed)
Follow up as scheduled Continue the Zoloft daily as directed If you decide, at any time, we need to increase the medication- just call me! Call with any questions or concerns Happy Fall!!!

## 2015-07-24 NOTE — Progress Notes (Signed)
   Subjective:    Patient ID: Susan Pittman, female    DOB: 1941-09-11, 74 y.o.   MRN: [id number]  HPI Anxiety- pt was started on Sertraline at last visit due to increased anxiety.  Still has anxious moments but 'it doesn't last all day'.  Pt reports 'i think it's helped quite a bit'.  Husband also reports improvement.  Less tearful, less snappy.  Pt reports feeling 'very good'.  Pt reports sleeping better.   Review of Systems For ROS see HPI     Objective:   Physical Exam  Constitutional: She is oriented to person, place, and time. She appears well-developed and well-nourished. No distress.  HENT:  Head: Normocephalic and atraumatic.  Neurological: She is alert and oriented to person, place, and time.  Skin: Skin is warm and dry.  Psychiatric: She has a normal mood and affect. Her behavior is normal. Thought content normal.  Vitals reviewed.         Assessment & Plan:

## 2015-07-24 NOTE — Progress Notes (Signed)
Pre visit review using our clinic review tool, if applicable. No additional management support is needed unless otherwise documented below in the visit note. 

## 2015-07-24 NOTE — Assessment & Plan Note (Signed)
Improved since starting Zoloft.  Pt and husband both feel symptoms are improving.  Asked if pt felt the need to increase dose to '50mg'$ .  She wants to give the '25mg'$  dose more time before making changes.  Will continue to follow.

## 2015-07-26 DIAGNOSIS — H2511 Age-related nuclear cataract, right eye: Secondary | ICD-10-CM | POA: Diagnosis not present

## 2015-08-09 DIAGNOSIS — H2511 Age-related nuclear cataract, right eye: Secondary | ICD-10-CM | POA: Diagnosis not present

## 2015-08-09 DIAGNOSIS — H25811 Combined forms of age-related cataract, right eye: Secondary | ICD-10-CM | POA: Diagnosis not present

## 2015-08-16 DIAGNOSIS — H2512 Age-related nuclear cataract, left eye: Secondary | ICD-10-CM | POA: Diagnosis not present

## 2015-08-16 DIAGNOSIS — N184 Chronic kidney disease, stage 4 (severe): Secondary | ICD-10-CM | POA: Diagnosis not present

## 2015-08-16 DIAGNOSIS — N028 Recurrent and persistent hematuria with other morphologic changes: Secondary | ICD-10-CM | POA: Diagnosis not present

## 2015-08-16 DIAGNOSIS — D649 Anemia, unspecified: Secondary | ICD-10-CM | POA: Diagnosis not present

## 2015-08-16 DIAGNOSIS — I129 Hypertensive chronic kidney disease with stage 1 through stage 4 chronic kidney disease, or unspecified chronic kidney disease: Secondary | ICD-10-CM | POA: Diagnosis not present

## 2015-08-28 ENCOUNTER — Ambulatory Visit (HOSPITAL_COMMUNITY)
Admission: RE | Admit: 2015-08-28 | Discharge: 2015-08-28 | Disposition: A | Discharge status: Home / Self Care | Payer: Medicare Other | Source: Ambulatory Visit | Attending: Internal Medicine | Admitting: Internal Medicine

## 2015-08-28 ENCOUNTER — Other Ambulatory Visit: Payer: Medicare Other

## 2015-08-28 ENCOUNTER — Encounter (HOSPITAL_COMMUNITY): Payer: Self-pay

## 2015-08-28 ENCOUNTER — Ambulatory Visit: Payer: PRIVATE HEALTH INSURANCE | Admitting: Family Medicine

## 2015-08-28 DIAGNOSIS — Z08 Encounter for follow-up examination after completed treatment for malignant neoplasm: Secondary | ICD-10-CM | POA: Insufficient documentation

## 2015-08-28 DIAGNOSIS — I517 Cardiomegaly: Secondary | ICD-10-CM | POA: Insufficient documentation

## 2015-08-28 DIAGNOSIS — Z85118 Personal history of other malignant neoplasm of bronchus and lung: Secondary | ICD-10-CM | POA: Diagnosis present

## 2015-08-28 DIAGNOSIS — I7090 Unspecified atherosclerosis: Secondary | ICD-10-CM | POA: Diagnosis not present

## 2015-08-28 DIAGNOSIS — C3432 Malignant neoplasm of lower lobe, left bronchus or lung: Secondary | ICD-10-CM | POA: Diagnosis not present

## 2015-08-28 DIAGNOSIS — C3492 Malignant neoplasm of unspecified part of left bronchus or lung: Secondary | ICD-10-CM | POA: Diagnosis not present

## 2015-08-28 DIAGNOSIS — N289 Disorder of kidney and ureter, unspecified: Secondary | ICD-10-CM | POA: Diagnosis not present

## 2015-08-28 DIAGNOSIS — K449 Diaphragmatic hernia without obstruction or gangrene: Secondary | ICD-10-CM | POA: Insufficient documentation

## 2015-08-28 LAB — CBC WITH DIFFERENTIAL/PLATELET
BASO%: 1.2 % (ref 0.0–2.0)
BASOS ABS: 0.1 10*3/uL (ref 0.0–0.1)
EOS%: 2.2 % (ref 0.0–7.0)
Eosinophils Absolute: 0.2 10*3/uL (ref 0.0–0.5)
HCT: 37.7 % (ref 34.8–46.6)
HEMOGLOBIN: 12 g/dL (ref 11.6–15.9)
LYMPH%: 17.5 % (ref 14.0–49.7)
MCH: 27.2 pg (ref 25.1–34.0)
MCHC: 31.7 g/dL (ref 31.5–36.0)
MCV: 85.7 fL (ref 79.5–101.0)
MONO#: 0.6 10*3/uL (ref 0.1–0.9)
MONO%: 6.7 % (ref 0.0–14.0)
NEUT#: 6.6 10*3/uL — ABNORMAL HIGH (ref 1.5–6.5)
NEUT%: 72.4 % (ref 38.4–76.8)
Platelets: 287 10*3/uL (ref 145–400)
RBC: 4.39 10*6/uL (ref 3.70–5.45)
RDW: 17.4 % — AB (ref 11.2–14.5)
WBC: 9.2 10*3/uL (ref 3.9–10.3)
lymph#: 1.6 10*3/uL (ref 0.9–3.3)

## 2015-08-28 LAB — COMPREHENSIVE METABOLIC PANEL (CC13)
ALT: 12 U/L (ref 0–55)
ANION GAP: 10 meq/L (ref 3–11)
AST: 16 U/L (ref 5–34)
Albumin: 3.7 g/dL (ref 3.5–5.0)
Alkaline Phosphatase: 89 U/L (ref 40–150)
BILIRUBIN TOTAL: 0.39 mg/dL (ref 0.20–1.20)
BUN: 46.8 mg/dL — ABNORMAL HIGH (ref 7.0–26.0)
CALCIUM: 9.9 mg/dL (ref 8.4–10.4)
CO2: 29 meq/L (ref 22–29)
CREATININE: 1.8 mg/dL — AB (ref 0.6–1.1)
Chloride: 109 mEq/L (ref 98–109)
EGFR: 27 mL/min/{1.73_m2} — ABNORMAL LOW (ref >90)
Glucose: 103 mg/dl (ref 70–140)
Potassium: 4 mEq/L (ref 3.5–5.1)
SODIUM: 148 meq/L — AB (ref 136–145)
TOTAL PROTEIN: 6.6 g/dL (ref 6.4–8.3)

## 2015-08-30 ENCOUNTER — Encounter: Payer: Self-pay | Admitting: Internal Medicine

## 2015-08-30 ENCOUNTER — Telehealth: Payer: Self-pay | Admitting: Internal Medicine

## 2015-08-30 ENCOUNTER — Ambulatory Visit (HOSPITAL_BASED_OUTPATIENT_CLINIC_OR_DEPARTMENT_OTHER): Payer: Medicare Other | Admitting: Internal Medicine

## 2015-08-30 VITALS — BP 154/53 | HR 67 | Temp 98.4°F | Resp 18 | Ht 64.0 in | Wt 241.1 lb

## 2015-08-30 DIAGNOSIS — N289 Disorder of kidney and ureter, unspecified: Secondary | ICD-10-CM | POA: Diagnosis not present

## 2015-08-30 DIAGNOSIS — Z85118 Personal history of other malignant neoplasm of bronchus and lung: Secondary | ICD-10-CM | POA: Diagnosis present

## 2015-08-30 DIAGNOSIS — C3432 Malignant neoplasm of lower lobe, left bronchus or lung: Secondary | ICD-10-CM

## 2015-08-30 NOTE — Telephone Encounter (Signed)
Gave adn printed appt sched and avs for pt for April 2017

## 2015-08-30 NOTE — Progress Notes (Signed)
[facility] [phone]   [phone]  OFFICE PROGRESS NOTE  [name], MD [address] [place] [address]  DIAGNOSIS: Stage IA (T1a, N0, M0) non-small cell lung cancer, adenocarcinoma diagnosed in January of 2015.  PRIOR THERAPY: Bronchoscopy, left video-assisted thoracoscopy, with wedge resection for biopsy of left lower lobe lesion, completion left lower lobectomy, lymph node dissection under the care of Dr. Servando Snare  CURRENT THERAPY: Observation.  INTERVAL HISTORY: Susan Pittman 73 y.o. female returns to the clinic today for followup visit accompanied by her husband. She denied having any significant chest pain, shortness breath, cough or hemoptysis. She denied having any significant nausea or vomiting. No significant weight loss or night sweats. He has no fever or chills. The patient had repeat CT scan of the chest performed recently and she is here for evaluation and discussion of her scan results.  MEDICAL HISTORY: Past Medical History  Diagnosis Date  . Asthma   . Hypertension   . GERD (gastroesophageal reflux disease)   . Allergy   . Hypothyroid   . Osteoporosis   . Gastric ulcer   . Hiatal hernia   . Hyperlipidemia     "don't take RX for it" (11/08/2013)  . Renal artery stenosis ([place])     with stent placement  . Lung mass     superior segment LLL/notes 11/08/2013  . Pneumonia     "I've had it ?3 times" (11/08/2013)  . Arthritis     "knees, hips, back, hands" (11/08/2013)  . DJD (degenerative joint disease)   . Lung nodule/ adenoca > LLobectomy 12/15/13  11/08/2013    PET 11/23/2013 1. Dominant sub solid left lower lobe nodule does not demonstrate significantly increased metabolic activity. However, morphologically, adenocarcinoma is still a concern.   - Spirometry 11/24/13 wnl  - 11/24/2013 T surg eval rec> LLower lobectomy 12/15/2013 for adenoca   . CAD (coronary artery disease), non obstructive by cardiac cath  12/12/13 01/24/2014  . Diastolic CHF ([place])   . CKD (chronic kidney disease), stage III   . Nephropathy   . Basal cell carcinoma     "cut them off face & right shoulder" (11/08/2013)    ALLERGIES:  is allergic to benazepril hcl; loratadine; allegra; celecoxib; lisinopril; and sulfa antibiotics.  MEDICATIONS:  Current Outpatient Prescriptions  Medication Sig Dispense Refill  . albuterol (PROVENTIL HFA;VENTOLIN HFA) 108 (90 BASE) MCG/ACT inhaler Inhale 1-2 puffs into the lungs every 6 (six) hours as needed for wheezing or shortness of breath. 18 g 1  . allopurinol (ZYLOPRIM) 100 MG tablet Take 100 mg by mouth 2 (two) times daily. Takes 2 tablets in am    . Calcium Carb-Cholecalciferol 600-800 MG-UNIT TABS Take 2 tablets by mouth daily.     . cetirizine (ZYRTEC) 10 MG tablet Take 10 mg by mouth at bedtime.     . colchicine 0.6 MG tablet Take 0.6 mg by mouth. Every Mon, Wed, and Friday    . DUREZOL 0.05 % EMUL     . Ferrous Sulfate (IRON SUPPLEMENT PO) Take 150 mg by mouth 2 (two) times daily.    . fluticasone (FLONASE) 50 MCG/ACT nasal spray USE TWO SPRAY IN EACH NOSTRIL EVERY DAY 16 g 6  . furosemide (LASIX) 40 MG tablet Take  40 mg in the pm until weight of 219 is reached. 90 tablet 1  . furosemide (LASIX) 80 MG tablet Take 1 tablet (80 mg total) by mouth  every morning. 90 tablet 1  . glucose blood (ONE TOUCH ULTRA TEST) test strip Pt to test 3-4x daily due to labile sugars Dx. 250.02 100 each 12  . hydrALAZINE (APRESOLINE) 50 MG tablet Take 50 mg by mouth 2 (two) times daily.    . Lancets (ONETOUCH ULTRASOFT) lancets Use 1 per day dx code 249.00 100 each 12  . lansoprazole (PREVACID) 15 MG capsule Take 15 mg by mouth daily at 12 noon.    Marland Kitchen levothyroxine (SYNTHROID, LEVOTHROID) 88 MCG tablet TAKE ONE TABLET (88 MG TOTAL) BY MOUTH ONCE DAILY BEFORE BREAKFAST 90 tablet 1  . Nebivolol HCl 20 MG TABS Take 1 tablet (20 mg total) by mouth daily. 30 tablet 6  . ONETOUCH DELICA LANCETS 78E MISC Use to  check blood sugar once daily as instructed. Dx code: E11.9 100 each 2  . polyethylene glycol powder (GLYCOLAX/MIRALAX) powder Take 17 g by mouth daily. 850 g 3  . PREDNISONE PO Take 2 mg by mouth daily.     . sertraline (ZOLOFT) 25 MG tablet Take 1 tablet (25 mg total) by mouth daily. 30 tablet 3   No current facility-administered medications for this visit.    SURGICAL HISTORY:  Past Surgical History  Procedure Laterality Date  . Knee arthroscopy Bilateral   . Anterior cervical decomp/discectomy fusion  2000    C5-C6  . Renal artery stent Right 11/08/2013    Archie Endo 11/08/2013  . Tonsillectomy and adenoidectomy  ?1946  . Cholecystectomy  1970's  . Vaginal hysterectomy  1970  . Dilation and curettage of uterus  1960's  . Skin cancer excision    . Coronary angiogram      Hx: of 2/ 2015  . Cardiac catheterization      Hx: of 2/ 2015  . Video bronchoscopy N/A 12/15/2013    Procedure: VIDEO BRONCHOSCOPY;  Surgeon: Grace Isaac, MD;  Location: [facility] OR;  Service: Thoracic;  Laterality: N/A;  . Video assisted thoracoscopy (vats)/wedge resection Left 12/15/2013    Procedure: VIDEO ASSISTED THORACOSCOPY (VATS)/WEDGE RESECTION;  Surgeon: Grace Isaac, MD;  Location: [facility];  Service: Thoracic;  Laterality: Left;  . Lobectomy Left 12/15/2013    Procedure: LOBECTOMY;  Surgeon: Grace Isaac, MD;  Location: [facility];  Service: Thoracic;  Laterality: Left;  . Renal angiogram Bilateral 11/08/2013    Procedure: RENAL ANGIOGRAM;  Surgeon: Lorretta Harp, MD;  Location: [facility] CATH LAB;  Service: Cardiovascular;  Laterality: Bilateral;  . Percutaneous stent intervention Right 11/08/2013    Procedure: PERCUTANEOUS STENT INTERVENTION;  Surgeon: Lorretta Harp, MD;  Location: [facility] CATH LAB;  Service: Cardiovascular;  Laterality: Right;  rt renal artery stent  . Left heart catheterization with coronary angiogram N/A 12/13/2013    Procedure: LEFT HEART CATHETERIZATION WITH CORONARY ANGIOGRAM;  Surgeon:  Burnell Blanks, MD;  Location: [facility] CATH LAB;  Service: Cardiovascular;  Laterality: N/A;    REVIEW OF SYSTEMS:  A comprehensive review of systems was negative.   PHYSICAL EXAMINATION: General appearance: alert, cooperative and no distress Head: Normocephalic, without obvious abnormality, atraumatic Neck: no adenopathy, no JVD, supple, symmetrical, trachea midline and thyroid not enlarged, symmetric, no tenderness/mass/nodules Lymph nodes: Cervical, supraclavicular, and axillary nodes normal. Resp: clear to auscultation bilaterally Back: symmetric, no curvature. ROM normal. No CVA tenderness. Cardio: regular rate and rhythm, S1, S2 normal, no murmur, click, rub or gallop GI: soft, non-tender; bowel sounds normal; no masses,  no organomegaly Extremities: extremities normal, atraumatic, no cyanosis or edema  ECOG PERFORMANCE STATUS: 1 - Symptomatic but completely ambulatory  Blood pressure 154/53, pulse 67, temperature 98.4 F (36.9 C), temperature source Oral, resp. rate 18, height '5\' 4"'$  (1.626 m), weight 241 lb 1.6 oz (109.362 kg), SpO2 99 %.  LABORATORY DATA: Lab Results  Component Value Date   WBC 9.2 08/28/2015   HGB 12.0 08/28/2015   HCT 37.7 08/28/2015   MCV 85.7 08/28/2015   PLT 287 08/28/2015      Chemistry      Component Value Date/Time   NA 148* 08/28/2015 0801   NA 143 06/28/2015 1354   K 4.0 08/28/2015 0801   K 4.3 06/28/2015 1354   CL 99 06/28/2015 1354   CO2 29 08/28/2015 0801   CO2 33* 06/28/2015 1354   BUN 46.8* 08/28/2015 0801   BUN 52* 06/28/2015 1354   CREATININE 1.8* 08/28/2015 0801   CREATININE 1.94* 06/28/2015 1354   CREATININE 2.34* 02/14/2014 1030      Component Value Date/Time   CALCIUM 9.9 08/28/2015 0801   CALCIUM 9.6 06/28/2015 1354   ALKPHOS 89 08/28/2015 0801   ALKPHOS 79 06/28/2015 1354   AST 16 08/28/2015 0801   AST 16 06/28/2015 1354   ALT 12 08/28/2015 0801   ALT 13 06/28/2015 1354   BILITOT 0.39 08/28/2015 0801   BILITOT  0.4 06/28/2015 1354       RADIOGRAPHIC STUDIES: Ct Chest Wo Contrast  08/28/2015  CLINICAL DATA:  Left lower lobe lung Lung cancer diagnosed in January 2015. Left lower lobectomy. EXAM: CT CHEST WITHOUT CONTRAST TECHNIQUE: Multidetector CT imaging of the chest was performed following the standard protocol without IV contrast. COMPARISON:  02/20/2015 FINDINGS: Mediastinum/Nodes: Aortic and branch vessel atherosclerotic calcification. Mild calcification of the mitral valve. Scattered small mediastinal lymph nodes are not pathologically enlarged by size criteria. Borderline cardiomegaly. Small type 1 hiatal hernia. Lungs/Pleura: Stable 4 mm right upper lobe nodule, image 22 series 5, not appreciably changed from 11/05/2013. Stable indistinct 3 mm right lower lobe pulmonary nodule on image 39 series 5, no change from 11/05/2013. Left lower lobectomy. Stable non dependent small foci of tracheobronchial wall thickening favoring polyps. Upper abdomen: Cholecystectomy. On the bottom most image (image 65, series 2), the medial parenchymal lip of the left mid kidney appears to have a more medially convex contour than it it has in the past. This makes it difficult to exclude a small mass. Musculoskeletal: Lower cervical plate and screw fixator. IMPRESSION: 1. Although I do not see evidence of recurrence in the chest, there is some apparent increased in prominence of the left mid kidney parenchyma medially which could possibly reflect an underlying small mass. Consider dedicated renal imaging such as renal protocol CT with and without contrast or renal protocol MRI with and without contrast for definitive characterization. 2. Several tiny nodules in the right lung are unchanged compared to earliest cross-sectional imaging of 11/05/2013. These are likely benign but merit observation. 3. Atherosclerosis. 4. Borderline cardiomegaly. 5. Small type 1 hiatal hernia. 6. Stable small tracheal polyps. Electronically Signed   By:  Van Clines M.D.   On: 08/28/2015 10:54   ASSESSMENT AND PLAN: Ms. a very pleasant 74 years old white female with history of stage IA non-small cell lung cancer status post left lower lobectomy with lymph node dissection.  The patient has no complaints today. Her recent CT scan of the chest showed no evidence for disease recurrence but there was questionable lesion in the left kidney that was seen on previous scan.  The recommendation is to do MRI of the kidneys with and without contrast or CT scan with and without contrast but unfortunately the patient has significant renal insufficiency. I discussed the scan results with the patient today. I recommended for her to discuss this finding with her nephrologist to see if there will be a possibility to proceed with CT or MRI with contrast. She has no evidence for disease recurrence of her lung cancer.  I recommended for her to continue on observation with repeat CT scan of the chest in 6 months for evaluation of her disease. She was advised to call immediately if she has any concerning symptoms in the interval. The patient voices understanding of current disease status and treatment options and is in agreement with the current care plan.  All questions were answered. The patient knows to call the clinic with any problems, questions or concerns. We can certainly see the patient much sooner if necessary.  Disclaimer: This note was dictated with voice recognition software. Similar sounding words can inadvertently be transcribed and may not be corrected upon review.

## 2015-08-31 ENCOUNTER — Encounter: Payer: Self-pay | Admitting: Cardiothoracic Surgery

## 2015-08-31 ENCOUNTER — Ambulatory Visit (INDEPENDENT_AMBULATORY_CARE_PROVIDER_SITE_OTHER): Payer: Medicare Other | Admitting: Cardiothoracic Surgery

## 2015-08-31 VITALS — BP 145/70 | HR 60 | Resp 20 | Ht 64.0 in | Wt 240.0 lb

## 2015-08-31 DIAGNOSIS — Z902 Acquired absence of lung [part of]: Secondary | ICD-10-CM | POA: Diagnosis not present

## 2015-08-31 DIAGNOSIS — I701 Atherosclerosis of renal artery: Secondary | ICD-10-CM | POA: Diagnosis not present

## 2015-08-31 DIAGNOSIS — C3432 Malignant neoplasm of lower lobe, left bronchus or lung: Secondary | ICD-10-CM

## 2015-08-31 NOTE — Progress Notes (Signed)
[address]       [place], [address]             [phone]                       Mar[place] L [place] Medical Record #[id number] Date of Birth: Oct 01, 1941  Referring KC:LEXN, Christena Deem, MD Primary Cardiology:Dr Gwenlyn Found Primary Care:Katherine [name], MD  Chief Complaint:   PostOp Follow Up Visit 12/15/2013  OPERATIVE REPORT  PREOPERATIVE DIAGNOSIS: Left lower lobe lung mass.  POSTOPERATIVE DIAGNOSIS: Left lower lobe lung mass. Adenocarcinoma by  frozen section.  PROCEDURE PERFORMED: Bronchoscopy, left video-assisted thoracoscopy,  with wedge resection for biopsy of left lower lobe lesion, completion  left lower lobectomy, lymph node dissection, placement of On-Q device.  SURGEON: Lanelle Bal, MD   Lung cancer, left lower lobe   Primary site: Lung (Left)   Staging method: AJCC 7th Edition   Pathologic: Stage IA (T1a, N0, cM0) signed by Grace Isaac, MD on 12/17/2013  8:37 AM   Summary: Stage IA (T1a, N0, cM0)  History of Present Illness:     Patient returns to the office today after left lower lobectomy for stage I adenocarcinoma the lung. Feels better now as her steroid doses have decreased. She is ambulating better but with a cane. Has had marked decrease in peripheral edema compared to 6 months ago. Her steroid dose has now been decreased to 5 mg a day.  History  Smoking status  . Never Smoker   Smokeless tobacco  . Never Used       Allergies  Allergen Reactions  . Benazepril Hcl Anaphylaxis and Cough  . Loratadine Anaphylaxis    anaphylaxis  . Allegra [Fexofenadine Hcl] Other (See Comments)    Severe back pain  . Celecoxib Other (See Comments)    REACTION: aching  . Lisinopril     REACTION: cough  . Sulfa Antibiotics Hives    Current Outpatient Prescriptions  Medication Sig Dispense Refill  . albuterol (PROVENTIL HFA;VENTOLIN HFA) 108 (90 BASE) MCG/ACT inhaler Inhale 1-2 puffs into the lungs every 6 (six)  hours as needed for wheezing or shortness of breath. 18 g 1  . allopurinol (ZYLOPRIM) 100 MG tablet Take 100 mg by mouth 2 (two) times daily. Takes 2 tablets in am    . Calcium Carb-Cholecalciferol 600-800 MG-UNIT TABS Take 2 tablets by mouth daily.     . cetirizine (ZYRTEC) 10 MG tablet Take 10 mg by mouth at bedtime.     . colchicine 0.6 MG tablet Take 0.6 mg by mouth. Every Mon, Wed, and Friday    . DUREZOL 0.05 % EMUL     . Ferrous Sulfate (IRON SUPPLEMENT PO) Take 150 mg by mouth 2 (two) times daily.    . fluticasone (FLONASE) 50 MCG/ACT nasal spray USE TWO SPRAY IN EACH NOSTRIL EVERY DAY 16 g 6  . furosemide (LASIX) 40 MG tablet Take  40 mg in the pm until weight of 219 is reached. 90 tablet 1  . furosemide (LASIX) 80 MG tablet Take 1 tablet (80 mg total) by mouth every morning. 90 tablet 1  . glucose blood (ONE TOUCH ULTRA TEST) test strip Pt to test 3-4x daily due to labile sugars Dx. 250.02 100 each 12  . hydrALAZINE (APRESOLINE) 50 MG tablet Take 50 mg by mouth 2 (two) times daily.    . Lancets (ONETOUCH ULTRASOFT) lancets Use  1 per day dx code 249.00 100 each 12  . lansoprazole (PREVACID) 15 MG capsule Take 15 mg by mouth daily at 12 noon.    Marland Kitchen levothyroxine (SYNTHROID, LEVOTHROID) 88 MCG tablet TAKE ONE TABLET (88 MG TOTAL) BY MOUTH ONCE DAILY BEFORE BREAKFAST 90 tablet 1  . Nebivolol HCl 20 MG TABS Take 1 tablet (20 mg total) by mouth daily. 30 tablet 6  . ONETOUCH DELICA LANCETS 65H MISC Use to check blood sugar once daily as instructed. Dx code: E11.9 100 each 2  . polyethylene glycol powder (GLYCOLAX/MIRALAX) powder Take 17 g by mouth daily. 850 g 3  . PREDNISONE PO Take 2 mg by mouth daily.     . sertraline (ZOLOFT) 25 MG tablet Take 1 tablet (25 mg total) by mouth daily. 30 tablet 3   No current facility-administered medications for this visit.       Physical Exam: There were no vitals taken for this visit.  General appearance: alert and cooperative Neurologic:  intact Heart: regular rate and rhythm, S1, S2 normal, no murmur, click, rub or gallop Lungs: diminished breath sounds LLL Abdomen: soft, non-tender; bowel sounds normal; no masses,  no organomegaly Extremities: extremities normal, atraumatic, no cyanosis , Homans sign is negative, patient has 2+ bilateral pedal edema but improved from previous exam  Wound: The larger incision and anterior chest tube site are well-healed. Chest tube site is completely healed the other incisions are totally healed without evidence of infection Patient has no cervical or supraclavicular adenopathy Both ankles are much less swollen than in the past, the patient is walking with a cane  Diagnostic Studies & Laboratory data:       Diagnosis: GMS stain demonstrates abundant microorganisms present within the hyalinized nodules with morphology consistent with Histoplasmosis capsulatum. 1. Lung, wedge biopsy/resection, Left lower lobe - INVASIVE ADENOCARCINOMA SEE COMMENT. - TUMOR INVOLVES SURGICAL MARGIN. - NO LYMPHOVASCULAR INVASION IDENTIFIED. - SEE TUMOR SYNOPTIC TEMPLATE BELOW. 2. Lymph node, biopsy, 8 node - ONE LYMPH NODE, NEGATIVE FOR TUMOR (0/1), SEE COMMENT. 3. Lymph node, biopsy, 11 L - ONE LYMPH NODE, NEGATIVE FOR TUMOR (0/1). 4. Lung, resection (segmental or lobe), Left lower - BENIGN LUNG, SEE COMMENT. - NEGATIVE FOR ATYPIA OR MALIGNANCY. - BRONCHOVASCULAR MARGIN, NEGATIVE FOR ATYPIA OR MALIGNANCY. 5. Lymph node, biopsy, 10 node - ONE LYMPH NODE, NEGATIVE FOR TUMOR (0/1). 6. Lymph node, biopsy, 12 node - ONE LYMPH NODE, NEGATIVE FOR TUMOR (0/1). Microscopic Comment 1. LUNG 1 of 3 FINAL for BRONWYN, BELASCO ([id number]) Microscopic Comment(continued) Specimen, including laterality: Left lower lobe (parts 1 and 4). Procedure: Wedge resection and lobectomy Specimen integrity (intact/disrupted): Intact Tumor site: Subpleural Maximum tumor size (cm): 1.7 cm Histologic type: Adenocarcinoma (acinar  and lepidic subtypes) Grade: I/well differentiated Margins: Present at margins, see comment. Distance to closest margin (cm): Present at margin Visceral pleura invasion: Absent Tumor extension: Tumor is confined to subpleural parenchyma Treatment effect (if treated with neoadjuvant therapy): None Lymph -Vascular invasion: Absent Lymph nodes: Number examined - 4; Number N1 nodes positive - 0; Number N2 nodes positive - 0 TNM code: pT1a, pN0, pMX Ancillary studies: Can be performed upon request Non-neoplastic lung: See part 4. Comments: The final surgical margin is part 4. 2. Sections of lymph node demonstrate multiple foci of extensively hyalinized and calcified intranodal nodules. AFB, PAS and GMS stains are pending and will be reported in an addendum. 4. There is no tumor grossly identified. Representative sections of the lung demonstrate non-neoplastic findings to include  minimal chronic interstitial inflammation with rare epithelioid granuloma, anthracotic pigment deposition, minimal peribronchiolar chronic inflammation, Langerhans cell histiocytic nodules, and an incidental 3 mm subpleural minute pulmonary meningothelial-like nodule (chemodectoma). The chemodectoma is an incidental, benign finding of no prognostic significance. There are no features of epithelial dysplasia or malignancy present. (CR:kh 12-16-13) Mali RUND DO    Recent Radiology Findings Ct Chest Wo Contrast  08/28/2015  CLINICAL DATA:  Left lower lobe lung Lung cancer diagnosed in January 2015. Left lower lobectomy. EXAM: CT CHEST WITHOUT CONTRAST TECHNIQUE: Multidetector CT imaging of the chest was performed following the standard protocol without IV contrast. COMPARISON:  02/20/2015 FINDINGS: Mediastinum/Nodes: Aortic and branch vessel atherosclerotic calcification. Mild calcification of the mitral valve. Scattered small mediastinal lymph nodes are not pathologically enlarged by size criteria. Borderline cardiomegaly.  Small type 1 hiatal hernia. Lungs/Pleura: Stable 4 mm right upper lobe nodule, image 22 series 5, not appreciably changed from 11/05/2013. Stable indistinct 3 mm right lower lobe pulmonary nodule on image 39 series 5, no change from 11/05/2013. Left lower lobectomy. Stable non dependent small foci of tracheobronchial wall thickening favoring polyps. Upper abdomen: Cholecystectomy. On the bottom most image (image 65, series 2), the medial parenchymal lip of the left mid kidney appears to have a more medially convex contour than it it has in the past. This makes it difficult to exclude a small mass. Musculoskeletal: Lower cervical plate and screw fixator. IMPRESSION: 1. Although I do not see evidence of recurrence in the chest, there is some apparent increased in prominence of the left mid kidney parenchyma medially which could possibly reflect an underlying small mass. Consider dedicated renal imaging such as renal protocol CT with and without contrast or renal protocol MRI with and without contrast for definitive characterization. 2. Several tiny nodules in the right lung are unchanged compared to earliest cross-sectional imaging of 11/05/2013. These are likely benign but merit observation. 3. Atherosclerosis. 4. Borderline cardiomegaly. 5. Small type 1 hiatal hernia. 6. Stable small tracheal polyps. Electronically Signed   By: Van Clines M.D.   On: 08/28/2015 10:54   I have independently reviewed the above radiology studies  and reviewed the findings with the patient.   Ct Chest Wo Contrast  02/20/2015   CLINICAL DATA:  Left lung cancer restaging.  EXAM: CT CHEST WITHOUT CONTRAST  TECHNIQUE: Multidetector CT imaging of the chest was performed following the standard protocol without IV contrast.  COMPARISON:  08/19/2014  FINDINGS: Mediastinum: The heart size is normal. There is no pericardial effusion. Calcified atherosclerotic plaque involves the thoracic aorta. The trachea appears patent and is  midline. Normal appearance of the esophagus. No enlarged mediastinal or hilar lymph nodes identified.  Lungs/Pleura: There is no pleural effusion identified. There is no airspace consolidation identified. Right upper lobe pulmonary nodule measures 4 mm, image 20/series 5. This is stable from previous exam. Right apical nodule is stable measuring 3 mm, image 11/series 5. Postoperative change in volume loss involving the left hemi thorax identified compatible with prior left lower lobe lobectomy. Faint nodule in the left midlung is unchanged measuring 2-3 mm, image 29/series 5.  Upper Abdomen: The adrenal glands are both normal. The visualized portions of the liver and spleen are unremarkable. Normal appearance of the pancreas.  Musculoskeletal: Review of the visualized osseous structures is negative for aggressive lytic or sclerotic bone lesion.  IMPRESSION: 1. No acute findings. No specific features identified to suggest recurrence of tumor status post left lower lobectomy. 2. Stable small nonspecific nodules.  Electronically Signed   By: Kerby Moors M.D.   On: 02/20/2015 09:34   Dg Tibia/fibula Right  08/25/2014   CLINICAL DATA:  Foot pain and swelling starting this morning. Bilateral ankle pain and swelling. Difficulty standing. Diabetes.  EXAM: RIGHT TIBIA AND FIBULA - 2 VIEW  COMPARISON:  None.  FINDINGS: Medial compartmental loss of articular space and marginal spurring in the knee. Mild patellar spurring.  No acute bony abnormality of the tibia or fibula noted. Plantar calcaneal spur.  IMPRESSION: 1. Osteoarthritis of the knee.  No acute bony findings.   Electronically Signed   By: Sherryl Barters M.D.   On: 08/25/2014 15:05   Ct Chest Wo Contrast  08/19/2014   CLINICAL DATA:  Left lower lobe lung cancer diagnosed 1/15. Surgery only. Restaging.Lung cancer, main bronchus, left C34.02 (ICD-10-CM) Malignant neoplasm of lower lobe of left lung C34.32 (ICD-10-CM)  EXAM: CT CHEST WITHOUT CONTRAST   TECHNIQUE: Multidetector CT imaging of the chest was performed following the standard protocol without IV contrast.  COMPARISON:  06/13/2014  FINDINGS: Lungs/Pleura: Nodularity along the non dependent trachea, including on images 7 through 11. Similar in configuration to on the prior exam, and back to 11/05/2013  Surgical changes of left lower lobectomy.  Mild centrilobular emphysema. 2 mm right apical lung nodule on image 9 is unchanged.  Central superior segment right lower lobe ground-glass opacity on image 28 of series 5. This measures 2.7 cm and is favored to represent an area of volume loss/ atelectasis. This is new.  Subpleural posterior right upper lobe 2 mm nodule is unchanged on image 18.  Left-sided pleural thickening is similar with resolution of trace fluid.  Heart/Mediastinum: No supraclavicular adenopathy. Aortic and branch vessel atherosclerosis. Mild cardiomegaly. No pericardial effusion. No mediastinal or definite hilar adenopathy, given limitations of unenhanced CT. Small hiatal hernia. Left hemidiaphragm elevation.  Upper Abdomen: Cholecystectomy. Normal imaged portions of the liver, spleen, pancreas, adrenal glands. Cholecystectomy. Too small to characterize upper pole left renal lesion, most likely a cyst. This measures approximately 8 mm.  Bones/Musculoskeletal:  No acute osseous abnormality.  IMPRESSION: 1. Status post left lower lobectomy, without evidence of recurrent or metastatic disease. 2. Persistent left pleural thickening. 3. Similar indeterminate pulmonary nodules. 4. Nodularity along the non dependent trachea. Given presence over prior exams, suspicious for tracheal polyps. Recommend attention on follow-up. 5. A new area of ground-glass opacity within the central right lower lobe. Favored to represent subsegmental atelectasis. Recommend attention on follow-up.   Electronically Signed   By: Abigail Miyamoto M.D.   On: 08/19/2014 13:18   Ct Chest Wo Contrast  02/17/2014   CLINICAL  DATA:  Lung infiltrates. History of lung carcinoma in January 2015 with a left lower lobectomy. Short of breath.  EXAM: CT CHEST WITHOUT CONTRAST  TECHNIQUE: Multidetector CT imaging of the chest was performed following the standard protocol without IV contrast.  COMPARISON:  Current chest radiograph.  Chest CT, 11/05/2013  FINDINGS: There are small right and minimal left pleural effusions. There is consolidation in coarse reticular opacity in the remaining left upper lobe in the posterior lung base. This may all be atelectasis. Pneumonia should be considered likely in the proper clinical setting.  There are 2 small stable nodular densities in the right lung, most likely benign granulomas, scarring or a combination. Mild stable scarring is noted in the anterior medial right lung base. This subsegmental atelectasis at the posterior right lung base. No pulmonary edema. No discrete left lung nodules are  seen. There is volume loss on the left from the left lower lobectomy.  There is new mild mediastinal adenopathy. A 1 cm short axis node lies in the precarinal region where it had measures 6.5 mm. There are several prevascular nodes that are prominent. There is a 7 mm node just superior to the main pulmonary artery. This measured 3 mm previously. There is a 9.4 mm right supraclavicular node which previously measured 1 mm smaller. There is and 6 mm left supraclavicular another which measured 4 mm previously.  Heart is normal in size and configuration.  Limited evaluation of the upper abdomen shows node with a or adrenal masses.  The bony thorax is demineralized. There are degenerative changes along the visualized spine. No osteoblastic or osteolytic lesions.  ri IMPRESSION: 1. There is focal opacity with associated linear and coarse reticular opacity in the posterior lower aspect of the remaining left lung. Although this could be atelectasis, pneumonia is suspected. There is a minimal associated left pleural effusion. 2.  There are prominent mediastinal lymph nodes which have increased from the prior study. This could reflect metastatic disease. The nodes may be reactive to the lower lung zone pneumonia. There are prominent supraclavicular nodes that have increased in size as well. No other evidence suggesting metastatic disease. 3. Smallght effusion.  No evidence of a right lung infiltrate. 4. To small right lung nodules are without significant change in most likely benign.   Electronically Signed   By: Lajean Manes M.D.    Recent Labs: Lab Results  Component Value Date   WBC 9.2 08/28/2015   HGB 12.0 08/28/2015   HCT 37.7 08/28/2015   PLT 287 08/28/2015   GLUCOSE 103 08/28/2015   CHOL 262* 03/18/2014   TRIG 307.0* 03/18/2014   HDL 67.40 03/18/2014   LDLDIRECT 126.3 09/10/2012   LDLCALC 133* 03/18/2014   ALT 12 08/28/2015   AST 16 08/28/2015   NA 148* 08/28/2015   K 4.0 08/28/2015   CL 99 06/28/2015   CREATININE 1.8* 08/28/2015   BUN 46.8* 08/28/2015   CO2 29 08/28/2015   TSH 0.61 06/28/2015   INR 1.01 02/25/2014   HGBA1C 5.6 07/07/2015    Assessment / Plan:    renal bx proven  IGA GLOMERULONEPHRITIS WITH FOCAL MILD ACTIVITY- improving on decreased dose of steroids Plan to see her back again in 6 months for followup of her stage I carcinoma of the lung/resected- follow-up CT scans show no evidence of recurrence  Overall the patient feels better than on her previous visit, her mobility has improved  noted on ct report ". Although I do not see evidence of recurrence in the chest, there is some apparent increased in prominence of the left mid kidney parenchyma medially which could possibly reflect an underlying small mass. Consider dedicated renal imaging such as renal protocol CT with and without contrast or renal protocol MRI with and without contrast for definitive characterization."  Patient will discuss with Dr Lorrene Reid to consider further imaging of kidney or not.   Grace Isaac MD       [address] [place] [address] Office [phone]   [facility]eeper [phone]

## 2015-09-04 DIAGNOSIS — H2512 Age-related nuclear cataract, left eye: Secondary | ICD-10-CM | POA: Diagnosis not present

## 2015-09-04 DIAGNOSIS — H25812 Combined forms of age-related cataract, left eye: Secondary | ICD-10-CM | POA: Diagnosis not present

## 2015-09-07 ENCOUNTER — Ambulatory Visit (INDEPENDENT_AMBULATORY_CARE_PROVIDER_SITE_OTHER): Payer: Medicare Other | Admitting: Family Medicine

## 2015-09-07 ENCOUNTER — Encounter: Payer: Self-pay | Admitting: Family Medicine

## 2015-09-07 VITALS — BP 140/80 | HR 57 | Temp 98.0°F | Resp 16 | Ht 64.0 in | Wt 238.5 lb

## 2015-09-07 DIAGNOSIS — I10 Essential (primary) hypertension: Secondary | ICD-10-CM | POA: Diagnosis not present

## 2015-09-07 DIAGNOSIS — E785 Hyperlipidemia, unspecified: Secondary | ICD-10-CM | POA: Diagnosis not present

## 2015-09-07 DIAGNOSIS — Z23 Encounter for immunization: Secondary | ICD-10-CM

## 2015-09-07 DIAGNOSIS — E669 Obesity, unspecified: Secondary | ICD-10-CM | POA: Diagnosis not present

## 2015-09-07 DIAGNOSIS — I701 Atherosclerosis of renal artery: Secondary | ICD-10-CM

## 2015-09-07 DIAGNOSIS — F411 Generalized anxiety disorder: Secondary | ICD-10-CM | POA: Diagnosis not present

## 2015-09-07 LAB — HEPATIC FUNCTION PANEL
ALT: 9 U/L (ref 0–35)
AST: 14 U/L (ref 0–37)
Albumin: 3.8 g/dL (ref 3.5–5.2)
Alkaline Phosphatase: 96 U/L (ref 39–117)
BILIRUBIN DIRECT: 0.1 mg/dL (ref 0.0–0.3)
BILIRUBIN TOTAL: 0.5 mg/dL (ref 0.2–1.2)
Total Protein: 6.8 g/dL (ref 6.0–8.3)

## 2015-09-07 LAB — LIPID PANEL
CHOLESTEROL: 184 mg/dL (ref 0–200)
HDL: 46.8 mg/dL (ref >39.00)
LDL CALC: 101 mg/dL — AB (ref 0–99)
NonHDL: 137
Total CHOL/HDL Ratio: 4
Triglycerides: 180 mg/dL — ABNORMAL HIGH (ref 0.0–149.0)
VLDL: 36 mg/dL (ref 0.0–40.0)

## 2015-09-07 LAB — BASIC METABOLIC PANEL
BUN: 34 mg/dL — ABNORMAL HIGH (ref 6–23)
CO2: 35 mEq/L — ABNORMAL HIGH (ref 19–32)
Calcium: 9.7 mg/dL (ref 8.4–10.5)
Chloride: 101 mEq/L (ref 96–112)
Creatinine, Ser: 1.52 mg/dL — ABNORMAL HIGH (ref 0.40–1.20)
GFR: 35.26 mL/min — ABNORMAL LOW (ref >60.00)
GLUCOSE: 101 mg/dL — AB (ref 70–99)
Potassium: 3.8 mEq/L (ref 3.5–5.1)
SODIUM: 144 meq/L (ref 135–145)

## 2015-09-07 LAB — MICROALBUMIN, URINE

## 2015-09-07 MED ORDER — SERTRALINE HCL 50 MG PO TABS: 50.0000 mg | ORAL_TABLET | Freq: Every day | ORAL | Status: DC

## 2015-09-07 NOTE — Assessment & Plan Note (Signed)
Deteriorated.  Pt's anxiety is worse than it was at last visit.  Based on this, will increase to '50mg'$  Zoloft daily.  Discussed possible need for counseling to deal with all that she has been through in the last few years.  Pt agreeable to increasing medication.  Will follow.

## 2015-09-07 NOTE — Progress Notes (Signed)
Pre visit review using our clinic review tool, if applicable. No additional management support is needed unless otherwise documented below in the visit note. 

## 2015-09-07 NOTE — Patient Instructions (Signed)
Follow up in 6-8 weeks to recheck anxiety We'll notify you of your lab results and make any changes if needed Continue to work on healthy diet and regular exercise- you look great! Increase the Zoloft to '50mg'$ - 2 tabs of what you currently have and 1 of the new prescription Call with any questions or concerns If you want to join [place] at the new [place] office, any scheduled appointments will automatically transfer and we will see you at [address] [place] [address] [place], [place] [address]  Happy Thanksgiving!!!

## 2015-09-07 NOTE — Progress Notes (Signed)
   Subjective:    Patient ID: Susan [place], female    DOB: 1941-04-27, 74 y.o.   MRN: [id number]  HPI Hyperlipidemia- chronic problem.  Attempting to control w/ healthy diet.  Denies abd pain, N/V, myalgias.  Obesity- pt's weight is down 2 lbs from last visit.  Attempting to increase her stamina w/ regular activity but is not getting regular exercise at this time.    HTN- chronic problem, on Lasix, Hydralazine, Nebivolol w/ adequate control.  No CP, SOB, HAs, visual changes, edema.  Anxiety- 'sometimes I feel like I'm going to climb out of my skin'.  Pt's initial improvement on Zoloft seems to have plateau'ed.   Review of Systems For ROS see HPI     Objective:   Physical Exam  Constitutional: She is oriented to person, place, and time. She appears well-developed and well-nourished. No distress.  obese  HENT:  Head: Normocephalic and atraumatic.  Eyes: Conjunctivae and EOM are normal. Pupils are equal, round, and reactive to light.  Neck: Normal range of motion. Neck supple. No thyromegaly present.  Cardiovascular: Normal rate, regular rhythm, normal heart sounds and intact distal pulses.   No murmur heard. Pulmonary/Chest: Effort normal and breath sounds normal. No respiratory distress.  Abdominal: Soft. She exhibits no distension. There is no tenderness.  Musculoskeletal: She exhibits no edema.  Lymphadenopathy:    She has no cervical adenopathy.  Neurological: She is alert and oriented to person, place, and time.  Skin: Skin is warm and dry.  Psychiatric: She has a normal mood and affect. Her behavior is normal.  Vitals reviewed.         Assessment & Plan:

## 2015-09-07 NOTE — Assessment & Plan Note (Signed)
Chronic problem.  Pt has lost another 2 lbs as she weans off prednisone.  Encouraged healthy diet and regular activity.  Check labs to risk stratify.  Will continue to follow.

## 2015-09-07 NOTE — Assessment & Plan Note (Signed)
Chronic problem.  Adequate control.  Asymptomatic.  Check labs.  No anticipated med changes 

## 2015-09-07 NOTE — Assessment & Plan Note (Signed)
Chronic problem.  Pt is attempting to control w/ diet b/c she is not interested in taking another medication.  Check labs.  Start meds prn.

## 2015-09-14 DIAGNOSIS — C44319 Basal cell carcinoma of skin of other parts of face: Secondary | ICD-10-CM | POA: Diagnosis not present

## 2015-09-14 DIAGNOSIS — Z85828 Personal history of other malignant neoplasm of skin: Secondary | ICD-10-CM | POA: Diagnosis not present

## 2015-09-14 DIAGNOSIS — D225 Melanocytic nevi of trunk: Secondary | ICD-10-CM | POA: Diagnosis not present

## 2015-09-14 DIAGNOSIS — Z86018 Personal history of other benign neoplasm: Secondary | ICD-10-CM | POA: Diagnosis not present

## 2015-09-14 DIAGNOSIS — D692 Other nonthrombocytopenic purpura: Secondary | ICD-10-CM | POA: Diagnosis not present

## 2015-09-14 DIAGNOSIS — D485 Neoplasm of uncertain behavior of skin: Secondary | ICD-10-CM | POA: Diagnosis not present

## 2015-09-19 LAB — HM DIABETES EYE EXAM

## 2015-09-25 ENCOUNTER — Ambulatory Visit (INDEPENDENT_AMBULATORY_CARE_PROVIDER_SITE_OTHER): Payer: Medicare Other | Admitting: Family

## 2015-09-25 ENCOUNTER — Encounter: Payer: Self-pay | Admitting: Family

## 2015-09-25 ENCOUNTER — Ambulatory Visit (HOSPITAL_BASED_OUTPATIENT_CLINIC_OR_DEPARTMENT_OTHER)
Admission: RE | Admit: 2015-09-25 | Discharge: 2015-09-25 | Disposition: A | Discharge status: Home / Self Care | Payer: Medicare Other | Source: Ambulatory Visit | Attending: Family | Admitting: Family

## 2015-09-25 VITALS — BP 138/80 | HR 60 | Temp 99.1°F | Resp 16 | Ht 64.0 in | Wt 238.4 lb

## 2015-09-25 DIAGNOSIS — R05 Cough: Secondary | ICD-10-CM | POA: Insufficient documentation

## 2015-09-25 DIAGNOSIS — I701 Atherosclerosis of renal artery: Secondary | ICD-10-CM

## 2015-09-25 DIAGNOSIS — J209 Acute bronchitis, unspecified: Secondary | ICD-10-CM | POA: Diagnosis not present

## 2015-09-25 MED ORDER — AZITHROMYCIN 250 MG PO TABS: ORAL_TABLET | ORAL | Status: DC

## 2015-09-25 MED ORDER — GUAIFENESIN-CODEINE 100-10 MG/5ML PO SYRP: 5.0000 mL | ORAL_SOLUTION | Freq: Three times a day (TID) | ORAL | Status: DC | PRN

## 2015-09-25 NOTE — Patient Instructions (Signed)
Please complete x ray on the first floor. Continue albuterol every 6 hours for the next few days until you are feeling better. Start zpak (antibiotic) for bronchitis. You may use cheratussin (codeine cough syrup as needed) for cough- (may cause drowsiness). Call if symptoms worsen, if you develop fever >101 or if symptoms are not improved in 2-3 days.

## 2015-09-25 NOTE — Progress Notes (Signed)
Pre visit review using our clinic review tool, if applicable. No additional management support is needed unless otherwise documented below in the visit note. 

## 2015-09-25 NOTE — Progress Notes (Signed)
Subjective:    Patient ID: Susan Pittman, female    DOB: August 08, 1941, 74 y.o.   MRN: [id number]  HPI  Susan Pittman is a 74 yr old female who presents today with chief complaint of cough. Cough began on 09/20/15.  Had sore throat 11/16 and 11/17 which has now resolved.  Reports cough is productive of green sputum.  She is s/p LLL resection due to non-small cell lung CA 1/15.   Feels like smoke alert last week worsened her symptoms.  Denies fever. Using albuterol inhaler with little improvement in her symptoms.  Had trouble sleeping last night due to cough.    Review of Systems    see HPI  Past Medical History  Diagnosis Date  . Asthma   . Hypertension   . GERD (gastroesophageal reflux disease)   . Allergy   . Hypothyroid   . Osteoporosis   . Gastric ulcer   . Hiatal hernia   . Hyperlipidemia     "don't take RX for it" (11/08/2013)  . Renal artery stenosis ([place])     with stent placement  . Lung mass     superior segment LLL/notes 11/08/2013  . Pneumonia     "I've had it ?3 times" (11/08/2013)  . Arthritis     "knees, hips, back, hands" (11/08/2013)  . DJD (degenerative joint disease)   . Lung nodule/ adenoca > LLobectomy 12/15/13  11/08/2013    PET 11/23/2013 1. Dominant sub solid left lower lobe nodule does not demonstrate significantly increased metabolic activity. However, morphologically, adenocarcinoma is still a concern.   - Spirometry 11/24/13 wnl  - 11/24/2013 T surg eval rec> LLower lobectomy 12/15/2013 for adenoca   . CAD (coronary artery disease), non obstructive by cardiac cath 12/12/13 01/24/2014  . Diastolic CHF ([place])   . CKD (chronic kidney disease), stage III   . Nephropathy   . Basal cell carcinoma     "cut them off face & right shoulder" (11/08/2013)    Social History   Social History  . Marital Status: Married    Spouse Name: N/A  . Number of Children: N/A  . Years of Education: N/A   Occupational History  . Retired    Social History Main Topics  . Smoking  status: Never Smoker   . Smokeless tobacco: Never Used  . Alcohol Use: No  . Drug Use: No  . Sexual Activity: Yes   Other Topics Concern  . Not on file   Social History Narrative    Past Surgical History  Procedure Laterality Date  . Knee arthroscopy Bilateral   . Anterior cervical decomp/discectomy fusion  2000    C5-C6  . Renal artery stent Right 11/08/2013    Archie Endo 11/08/2013  . Tonsillectomy and adenoidectomy  ?1946  . Cholecystectomy  1970's  . Vaginal hysterectomy  1970  . Dilation and curettage of uterus  1960's  . Skin cancer excision    . Coronary angiogram      Hx: of 2/ 2015  . Cardiac catheterization      Hx: of 2/ 2015  . Video bronchoscopy N/A 12/15/2013    Procedure: VIDEO BRONCHOSCOPY;  Surgeon: Grace Isaac, MD;  Location: [facility] OR;  Service: Thoracic;  Laterality: N/A;  . Video assisted thoracoscopy (vats)/wedge resection Left 12/15/2013    Procedure: VIDEO ASSISTED THORACOSCOPY (VATS)/WEDGE RESECTION;  Surgeon: Grace Isaac, MD;  Location: [place];  Service: Thoracic;  Laterality: Left;  . Lobectomy Left 12/15/2013    Procedure:  LOBECTOMY;  Surgeon: Grace Isaac, MD;  Location: [place];  Service: Thoracic;  Laterality: Left;  . Renal angiogram Bilateral 11/08/2013    Procedure: RENAL ANGIOGRAM;  Surgeon: Lorretta Harp, MD;  Location: [facility] CATH LAB;  Service: Cardiovascular;  Laterality: Bilateral;  . Percutaneous stent intervention Right 11/08/2013    Procedure: PERCUTANEOUS STENT INTERVENTION;  Surgeon: Lorretta Harp, MD;  Location: [facility] CATH LAB;  Service: Cardiovascular;  Laterality: Right;  rt renal artery stent  . Left heart catheterization with coronary angiogram N/A 12/13/2013    Procedure: LEFT HEART CATHETERIZATION WITH CORONARY ANGIOGRAM;  Surgeon: Burnell Blanks, MD;  Location: [facility] CATH LAB;  Service: Cardiovascular;  Laterality: N/A;    Family History  Problem Relation Age of Onset  . Breast cancer Sister   . Emphysema Father      smoked  . Allergies Sister   . Allergies Brother   . Heart disease Mother   . Heart disease Father   . Diabetes Father   . Diabetes Brother   . Stroke Father   . Heart failure Neg Hx     Allergies  Allergen Reactions  . Benazepril Hcl Anaphylaxis and Cough  . Loratadine Anaphylaxis    anaphylaxis  . Allegra [Fexofenadine Hcl] Other (See Comments)    Severe back pain  . Celecoxib Other (See Comments)    REACTION: aching  . Lisinopril     REACTION: cough  . Sulfa Antibiotics Hives    Current Outpatient Prescriptions on File Prior to Visit  Medication Sig Dispense Refill  . albuterol (PROVENTIL HFA;VENTOLIN HFA) 108 (90 BASE) MCG/ACT inhaler Inhale 1-2 puffs into the lungs every 6 (six) hours as needed for wheezing or shortness of breath. 18 g 1  . allopurinol (ZYLOPRIM) 100 MG tablet Take 100 mg by mouth 2 (two) times daily. Takes 2 tablets in am    . Calcium Carb-Cholecalciferol 600-800 MG-UNIT TABS Take 2 tablets by mouth daily.     . cetirizine (ZYRTEC) 10 MG tablet Take 10 mg by mouth at bedtime.     . DUREZOL 0.05 % EMUL     . Ferrous Sulfate (IRON SUPPLEMENT PO) Take 150 mg by mouth 2 (two) times daily.    . fluticasone (FLONASE) 50 MCG/ACT nasal spray USE TWO SPRAY IN EACH NOSTRIL EVERY DAY 16 g 6  . furosemide (LASIX) 40 MG tablet Take  40 mg in the pm until weight of 219 is reached. (Patient taking differently: Take '80mg'$  in the am and 40 mg in the pm until weight of 219 is reached.) 90 tablet 1  . furosemide (LASIX) 80 MG tablet Take 1 tablet (80 mg total) by mouth every morning. 90 tablet 1  . glucose blood (ONE TOUCH ULTRA TEST) test strip Pt to test 3-4x daily due to labile sugars Dx. 250.02 (Patient taking differently: Pt to test 3-4x a week due to labile sugars Dx. 250.02) 100 each 12  . hydrALAZINE (APRESOLINE) 50 MG tablet Take 50 mg by mouth 2 (two) times daily.    . Lancets (ONETOUCH ULTRASOFT) lancets Use 1 per day dx code 249.00 100 each 12  . lansoprazole  (PREVACID) 15 MG capsule Take 15 mg by mouth daily at 12 noon.    Marland Kitchen levothyroxine (SYNTHROID, LEVOTHROID) 88 MCG tablet TAKE ONE TABLET (88 MG TOTAL) BY MOUTH ONCE DAILY BEFORE BREAKFAST 90 tablet 1  . Nebivolol HCl 20 MG TABS Take 1 tablet (20 mg total) by mouth daily. 30 tablet 6  .  ONETOUCH DELICA LANCETS 19J MISC Use to check blood sugar once daily as instructed. Dx code: E11.9 100 each 2  . PREDNISONE PO Take 2 mg by mouth daily.     . sertraline (ZOLOFT) 50 MG tablet Take 1 tablet (50 mg total) by mouth daily. 30 tablet 3   No current facility-administered medications on file prior to visit.    BP 138/80 mmHg  Pulse 60  Temp(Src) 99.1 F (37.3 C) (Oral)  Resp 16  Ht '5\' 4"'$  (1.626 m)  Wt 238 lb 6.4 oz (108.138 kg)  BMI 40.90 kg/m2  SpO2 100%    Objective:   Physical Exam  Constitutional: She is oriented to person, place, and time. She appears well-developed and well-nourished.  HENT:  Head: Normocephalic and atraumatic.  Right Ear: Tympanic membrane and ear canal normal.  Left Ear: Tympanic membrane and ear canal normal.  Mouth/Throat: No oropharyngeal exudate or posterior oropharyngeal edema.  Cardiovascular: Normal rate, regular rhythm and normal heart sounds.   No murmur heard. Pulmonary/Chest: Effort normal and breath sounds normal. No respiratory distress. She has no wheezes.  Musculoskeletal: She exhibits no edema.  Lymphadenopathy:    She has no cervical adenopathy.  Neurological: She is alert and oriented to person, place, and time.  Psychiatric: She has a normal mood and affect. Her behavior is normal. Judgment and thought content normal.          Assessment & Plan:  Acute bronchitis- pt with low grade fever.  Advised pt as follows:  Please complete x ray on the first floor. Continue albuterol every 6 hours for the next few days until you are feeling better. Start zpak (antibiotic) for bronchitis. You may use cheratussin (codeine cough syrup as needed)  for cough- (may cause drowsiness). Call if symptoms worsen, if you develop fever >101 or if symptoms are not improved in 2-3 days.

## 2015-09-26 ENCOUNTER — Telehealth: Payer: Self-pay | Admitting: Family Medicine

## 2015-09-26 NOTE — Telephone Encounter (Signed)
Pt states that [facility] never received the rx for azithromycin (ZITHROMAX) 250 MG tablet , states she received the cough syrup.

## 2015-09-26 NOTE — Telephone Encounter (Signed)
Pharmacy does not open til 9am. Will call to verify azithromycin Rx after that.

## 2015-09-26 NOTE — Telephone Encounter (Signed)
Spoke with pharmacist, they were having computer problems yesterday and did not receive the zpack. Rx called to pharmacist per 09/25/15 Rx, notified pt.

## 2015-10-02 ENCOUNTER — Ambulatory Visit (INDEPENDENT_AMBULATORY_CARE_PROVIDER_SITE_OTHER): Payer: Medicare Other | Admitting: Family Medicine

## 2015-10-02 ENCOUNTER — Encounter: Payer: Self-pay | Admitting: Family Medicine

## 2015-10-02 VITALS — BP 136/78 | HR 52 | Temp 98.1°F | Resp 16 | Ht 64.0 in | Wt 239.0 lb

## 2015-10-02 DIAGNOSIS — R319 Hematuria, unspecified: Secondary | ICD-10-CM

## 2015-10-02 DIAGNOSIS — I701 Atherosclerosis of renal artery: Secondary | ICD-10-CM

## 2015-10-02 DIAGNOSIS — M545 Low back pain, unspecified: Secondary | ICD-10-CM | POA: Insufficient documentation

## 2015-10-02 LAB — POCT URINALYSIS DIPSTICK
Bilirubin, UA: NEGATIVE
Glucose, UA: NEGATIVE
Ketones, UA: NEGATIVE
LEUKOCYTES UA: NEGATIVE
NITRITE UA: NEGATIVE
PH UA: 6
PROTEIN UA: NEGATIVE
Spec Grav, UA: 1.015
UROBILINOGEN UA: 0.2

## 2015-10-02 NOTE — Patient Instructions (Signed)
Follow up as scheduled We'll notify you of your urine culture and determine if antibiotics are needed I suspect you overdid it on Thursday and will continue to feel better! Drink plenty of fluids Call with any questions or concerns Happy Holidays!!!

## 2015-10-02 NOTE — Assessment & Plan Note (Signed)
Resolved by time of appt today.  Suspect that this is due to lifting her grandchildren multiple times on Thursday and not UTI as pt was concerned about.  Will send urine for culture and treat if needed.  No tx required at this time.

## 2015-10-02 NOTE — Progress Notes (Signed)
Pre visit review using our clinic review tool, if applicable. No additional management support is needed unless otherwise documented below in the visit note. 

## 2015-10-02 NOTE — Assessment & Plan Note (Signed)
Noted on UA.  Send urine for cx and treat prn.  Pt has hx of this and sees nephrology regularly.

## 2015-10-02 NOTE — Addendum Note (Signed)
Addended by: Harl Bowie on: 10/02/2015 02:32 PM   Modules accepted: Orders

## 2015-10-02 NOTE — Progress Notes (Signed)
   Subjective:    Patient ID: Susan Pittman, female    DOB: 1940-12-16, 74 y.o.   MRN: [id number]  HPI LBP- pt reports 'backache' Friday, Saturday, Sunday.  'it's like my kidneys hurt'.  Pt increased water intake yesterday and pain improved.  Picked up great granddaughters on Thursday, age 66.  Picked up the girls multiple times.  Denies frequency, dysuria.   Review of Systems For ROS see HPI     Objective:   Physical Exam  Constitutional: She is oriented to person, place, and time. She appears well-developed and well-nourished.  HENT:  Head: Normocephalic and atraumatic.  Cardiovascular: Normal rate, regular rhythm and normal heart sounds.   Pulmonary/Chest: Effort normal and breath sounds normal. No respiratory distress. She has no wheezes. She has no rales.  Clearing of throat, dry cough during visit  Abdominal: She exhibits no distension. There is no tenderness (no CVA or suprapubic tenderness). There is no rebound and no guarding.  Neurological: She is alert and oriented to person, place, and time.  Skin: Skin is warm and dry.  Psychiatric: She has a normal mood and affect. Her behavior is normal. Thought content normal.  Vitals reviewed.         Assessment & Plan:

## 2015-10-03 LAB — URINE CULTURE: Colony Count: 4000

## 2015-10-06 ENCOUNTER — Other Ambulatory Visit: Payer: Self-pay | Admitting: Cardiovascular Disease

## 2015-10-12 DIAGNOSIS — C44319 Basal cell carcinoma of skin of other parts of face: Secondary | ICD-10-CM | POA: Diagnosis not present

## 2015-10-17 ENCOUNTER — Other Ambulatory Visit: Payer: Self-pay | Admitting: Nephrology

## 2015-10-17 DIAGNOSIS — E099 Drug or chemical induced diabetes mellitus without complications: Secondary | ICD-10-CM | POA: Diagnosis not present

## 2015-10-17 DIAGNOSIS — R93429 Abnormal radiologic findings on diagnostic imaging of unspecified kidney: Secondary | ICD-10-CM | POA: Diagnosis not present

## 2015-10-17 DIAGNOSIS — T380X5A Adverse effect of glucocorticoids and synthetic analogues, initial encounter: Secondary | ICD-10-CM | POA: Diagnosis not present

## 2015-10-17 DIAGNOSIS — M109 Gout, unspecified: Secondary | ICD-10-CM | POA: Diagnosis not present

## 2015-10-17 DIAGNOSIS — I519 Heart disease, unspecified: Secondary | ICD-10-CM | POA: Diagnosis not present

## 2015-10-17 DIAGNOSIS — D649 Anemia, unspecified: Secondary | ICD-10-CM | POA: Diagnosis not present

## 2015-10-17 DIAGNOSIS — I701 Atherosclerosis of renal artery: Secondary | ICD-10-CM | POA: Diagnosis not present

## 2015-10-17 DIAGNOSIS — R918 Other nonspecific abnormal finding of lung field: Secondary | ICD-10-CM | POA: Diagnosis not present

## 2015-10-17 DIAGNOSIS — N184 Chronic kidney disease, stage 4 (severe): Secondary | ICD-10-CM | POA: Diagnosis not present

## 2015-10-17 DIAGNOSIS — N028 Recurrent and persistent hematuria with other morphologic changes: Secondary | ICD-10-CM | POA: Diagnosis not present

## 2015-10-17 DIAGNOSIS — I129 Hypertensive chronic kidney disease with stage 1 through stage 4 chronic kidney disease, or unspecified chronic kidney disease: Secondary | ICD-10-CM | POA: Diagnosis not present

## 2015-10-19 ENCOUNTER — Ambulatory Visit (INDEPENDENT_AMBULATORY_CARE_PROVIDER_SITE_OTHER): Payer: Medicare Other | Admitting: Family Medicine

## 2015-10-19 ENCOUNTER — Encounter: Payer: Self-pay | Admitting: Family Medicine

## 2015-10-19 VITALS — BP 130/80 | HR 64 | Temp 98.0°F | Resp 16 | Ht 64.0 in | Wt 241.0 lb

## 2015-10-19 DIAGNOSIS — F411 Generalized anxiety disorder: Secondary | ICD-10-CM

## 2015-10-19 DIAGNOSIS — I701 Atherosclerosis of renal artery: Secondary | ICD-10-CM

## 2015-10-19 MED ORDER — SERTRALINE HCL 50 MG PO TABS: 75.0000 mg | ORAL_TABLET | Freq: Every day | ORAL | Status: DC

## 2015-10-19 NOTE — Patient Instructions (Signed)
Follow up in 6-8 weeks to recheck agitation Increase the Zoloft to 1.5 tabs daily Try and find a stress outlet Call with any questions or concerns If you want to join [place] at the new [place] office, any scheduled appointments will automatically transfer and we will see you at [address] [place] [address] Susan [place], [place] [phone] ([place] 11/07/15) [place] in there! Merry Christmas

## 2015-10-19 NOTE — Progress Notes (Signed)
Pre visit review using our clinic review tool, if applicable. No additional management support is needed unless otherwise documented below in the visit note. 

## 2015-10-19 NOTE — Assessment & Plan Note (Signed)
Deteriorated.  Pt is having a difficulty time controlling her irritability and agitation.  Doesn't understand why she feels this way b/c she doesn't feel that she has any reason to be upset.  Again tried to explain to pt that she has been through quite a bit in the last 2 years and she may not have fully processed all of it.  Recommended counseling- pt declined at this time.  Will increase Zoloft to '75mg'$  daily and see if agitation improves.  Reviewed meds w/ pt and reassured her that this is not likely due to any of her current medications.  Will continue to follow closely and adjust tx plan prn.  Pt expressed understanding and is in agreement w/ plan.

## 2015-10-19 NOTE — Progress Notes (Signed)
   Subjective:    Patient ID: Susan Pittman, female    DOB: 04-21-1941, 74 y.o.   MRN: [id number]  HPI Anxiety- pt increased Sertraline at last visit to '50mg'$  daily.  Pt reports this has not made a difference.  'i'm just agitated all the time anymore'.  Reports she is not agitated in the morning or later in the evening.  Pt reports 'i feel like i could just squeeze somebody's head'.  Pt reports there is no reason for her to be upset.  Pt continues to wean off prednisone- now down to '2mg'$  every other day.   Review of Systems For ROS see HPI     Objective:   Physical Exam  Constitutional: She is oriented to person, place, and time. She appears well-developed and well-nourished. No distress.  HENT:  Head: Normocephalic and atraumatic.  Eyes: Conjunctivae and EOM are normal. Pupils are equal, round, and reactive to light.  Neurological: She is alert and oriented to person, place, and time.  Skin: Skin is warm and dry.  Psychiatric: She has a normal mood and affect. Her behavior is normal. Thought content normal.  Vitals reviewed.         Assessment & Plan:

## 2015-10-20 ENCOUNTER — Encounter: Payer: Self-pay | Admitting: General Practice

## 2015-10-20 ENCOUNTER — Telehealth: Payer: Self-pay | Admitting: Family Medicine

## 2015-10-20 NOTE — Telephone Encounter (Signed)
-----   Message from [facility] sent at 10/19/2015  1:52 PM EST ----- Pt is needing 6-8 wk follow up to recheck agitation, with dr. [name]

## 2015-10-20 NOTE — Telephone Encounter (Signed)
Scheduled for 11/30/15 1:15pm in [place] - left msg for pt to call back and reschedule if this time does not work for her

## 2015-10-25 ENCOUNTER — Other Ambulatory Visit (HOSPITAL_COMMUNITY): Payer: Self-pay | Admitting: *Deleted

## 2015-10-26 ENCOUNTER — Ambulatory Visit
Admission: RE | Admit: 2015-10-26 | Discharge: 2015-10-26 | Disposition: A | Discharge status: Home / Self Care | Payer: Medicare Other | Source: Ambulatory Visit | Attending: Nephrology | Admitting: Nephrology

## 2015-10-26 ENCOUNTER — Ambulatory Visit (HOSPITAL_COMMUNITY)
Admission: RE | Admit: 2015-10-26 | Discharge: 2015-10-26 | Disposition: A | Discharge status: Home / Self Care | Payer: Medicare Other | Source: Ambulatory Visit | Attending: Nephrology | Admitting: Nephrology

## 2015-10-26 DIAGNOSIS — N184 Chronic kidney disease, stage 4 (severe): Secondary | ICD-10-CM | POA: Diagnosis not present

## 2015-10-26 DIAGNOSIS — D509 Iron deficiency anemia, unspecified: Secondary | ICD-10-CM | POA: Insufficient documentation

## 2015-10-26 MED ORDER — SODIUM CHLORIDE 0.9 % IV SOLN
510.0000 mg | Freq: Once | INTRAVENOUS | Status: AC
  Administered 2015-10-26: 510 mg via INTRAVENOUS
  Filled 2015-10-26: qty 17

## 2015-11-01 ENCOUNTER — Other Ambulatory Visit: Payer: Self-pay | Admitting: Family Medicine

## 2015-11-01 NOTE — Telephone Encounter (Signed)
Medication filled to pharmacy as requested.   

## 2015-11-16 ENCOUNTER — Other Ambulatory Visit: Payer: Self-pay | Admitting: Family Medicine

## 2015-11-30 ENCOUNTER — Ambulatory Visit (INDEPENDENT_AMBULATORY_CARE_PROVIDER_SITE_OTHER): Payer: Medicare Other | Admitting: Family Medicine

## 2015-11-30 ENCOUNTER — Encounter: Payer: Self-pay | Admitting: Family Medicine

## 2015-11-30 VITALS — BP 122/59 | HR 58 | Temp 97.7°F | Ht 65.0 in | Wt 241.6 lb

## 2015-11-30 DIAGNOSIS — F411 Generalized anxiety disorder: Secondary | ICD-10-CM

## 2015-11-30 NOTE — Assessment & Plan Note (Signed)
Improved.  Pt's sxs are much better since increasing Zoloft to 1.5 tabs.  No changes at this time.  Will follow.

## 2015-11-30 NOTE — Progress Notes (Signed)
   Subjective:    Patient ID: Susan Pittman, female    DOB: 1941-07-23, 75 y.o.   MRN: [id number]  HPI Anxiety- pt increased Zoloft to 1.5 tabs 5 weeks ago.  Pt in the interim has weaned completely off Prednisone.  Pt reports feeling MUCH better.  Husband is in agreement.  This is 1st time in 2 yrs she is off all prednisone.   Review of Systems For ROS see HPI     Objective:   Physical Exam  Constitutional: She is oriented to person, place, and time. She appears well-developed and well-nourished. No distress.  HENT:  Head: Normocephalic and atraumatic.  Neurological: She is alert and oriented to person, place, and time.  Skin: Skin is warm and dry.  Psychiatric: She has a normal mood and affect. Her behavior is normal. Thought content normal.  Vitals reviewed.         Assessment & Plan:

## 2015-11-30 NOTE — Patient Instructions (Signed)
Schedule your complete physical for [place] You look great!!  Keep up the good work! Call with any questions or concerns [place]!!!

## 2015-11-30 NOTE — Progress Notes (Signed)
Pre visit review using our clinic review tool, if applicable. No additional management support is needed unless otherwise documented below in the visit note. 

## 2015-12-06 DIAGNOSIS — D649 Anemia, unspecified: Secondary | ICD-10-CM | POA: Diagnosis not present

## 2015-12-06 DIAGNOSIS — N184 Chronic kidney disease, stage 4 (severe): Secondary | ICD-10-CM | POA: Diagnosis not present

## 2015-12-06 DIAGNOSIS — I129 Hypertensive chronic kidney disease with stage 1 through stage 4 chronic kidney disease, or unspecified chronic kidney disease: Secondary | ICD-10-CM | POA: Diagnosis not present

## 2015-12-06 DIAGNOSIS — I701 Atherosclerosis of renal artery: Secondary | ICD-10-CM | POA: Diagnosis not present

## 2015-12-06 DIAGNOSIS — R93429 Abnormal radiologic findings on diagnostic imaging of unspecified kidney: Secondary | ICD-10-CM | POA: Diagnosis not present

## 2015-12-06 DIAGNOSIS — M109 Gout, unspecified: Secondary | ICD-10-CM | POA: Diagnosis not present

## 2015-12-06 DIAGNOSIS — R918 Other nonspecific abnormal finding of lung field: Secondary | ICD-10-CM | POA: Diagnosis not present

## 2015-12-06 DIAGNOSIS — N028 Recurrent and persistent hematuria with other morphologic changes: Secondary | ICD-10-CM | POA: Diagnosis not present

## 2015-12-08 ENCOUNTER — Other Ambulatory Visit: Payer: Self-pay | Admitting: Nephrology

## 2015-12-08 DIAGNOSIS — N2889 Other specified disorders of kidney and ureter: Secondary | ICD-10-CM

## 2016-01-05 ENCOUNTER — Ambulatory Visit (INDEPENDENT_AMBULATORY_CARE_PROVIDER_SITE_OTHER): Payer: Medicare Other | Admitting: Endocrinology

## 2016-01-05 ENCOUNTER — Encounter: Payer: Self-pay | Admitting: Endocrinology

## 2016-01-05 VITALS — BP 136/84 | HR 61 | Temp 98.6°F | Ht 65.0 in | Wt 237.0 lb

## 2016-01-05 DIAGNOSIS — E119 Type 2 diabetes mellitus without complications: Secondary | ICD-10-CM | POA: Diagnosis not present

## 2016-01-05 LAB — POCT GLYCOSYLATED HEMOGLOBIN (HGB A1C): HEMOGLOBIN A1C: 5.2

## 2016-01-05 NOTE — Progress Notes (Signed)
   Subjective:    Patient ID: Susan Pittman, female    DOB: 11-03-1941, 75 y.o.   MRN: [id number]  HPI Pt returns for f/u of diabetes mellitus: DM type: 2 Dx'ed: 2015, after she was started on prednisone for IgA nephropathy.   Complications: none (renal failure is due to IgA nephropathy) Therapy: now none.  GDM: never DKA: never Severe hypoglycemia: never Pancreatitis: never Other: she has been off prednisone since jan of 2017; she has been off repaglinide since early 2016; renal failure limits oral rx options. Interval history: pt states she feels well in general.  She seldom checks cbg's, and when she does, it is well-controlled    Review of Systems No change in chronic edema    Objective:   Physical Exam VITAL SIGNS:  See vs page GENERAL: no distress Pulses: dorsalis pedis intact bilat.   MSK: no deformity of the feet CV: 1+ bilat leg edema, and bilat varicosities Skin:  no ulcer on the feet.  normal color and temp on the feet. Neuro: sensation is intact to touch on the feet   A1c=5.2%     Assessment & Plan:  DM: stable off rx.  She will develop DM off steroids at some point, so she needs annual a1c.  Patient is advised the following: Patient Instructions  Unless you go back on the steroids, all you need to do is to occasionally check your blood sugar, and have an annual check with Dr [name]. However, if you go back on, please come back here. check your blood sugar once a week.  vary the time of day when you check, between before the 3 meals, and at bedtime.  also check if you have symptoms of your blood sugar being too high or too low.  please keep a record of the readings and bring it to your next appointment here (or you can bring the meter itself).  You can write it on any piece of paper.  please call us sooner if you have a lot of readings over 150.

## 2016-01-05 NOTE — Patient Instructions (Signed)
Unless you go back on the steroids, all you need to do is to occasionally check your blood sugar, and have an annual check with Dr [name]. However, if you go back on, please come back here. check your blood sugar once a week.  vary the time of day when you check, between before the 3 meals, and at bedtime.  also check if you have symptoms of your blood sugar being too high or too low.  please keep a record of the readings and bring it to your next appointment here (or you can bring the meter itself).  You can write it on any piece of paper.  please call us sooner if you have a lot of readings over 150.

## 2016-01-10 ENCOUNTER — Ambulatory Visit (INDEPENDENT_AMBULATORY_CARE_PROVIDER_SITE_OTHER): Payer: Medicare Other | Admitting: Family Medicine

## 2016-01-10 ENCOUNTER — Other Ambulatory Visit: Payer: Self-pay | Admitting: Family Medicine

## 2016-01-10 ENCOUNTER — Encounter: Payer: Self-pay | Admitting: Family Medicine

## 2016-01-10 VITALS — BP 144/101 | HR 74 | Ht 65.0 in | Wt 235.0 lb

## 2016-01-10 VITALS — BP 136/80 | HR 51 | Temp 98.1°F | Resp 16 | Ht 65.0 in | Wt 235.4 lb

## 2016-01-10 DIAGNOSIS — M7062 Trochanteric bursitis, left hip: Secondary | ICD-10-CM

## 2016-01-10 DIAGNOSIS — M25552 Pain in left hip: Secondary | ICD-10-CM

## 2016-01-10 DIAGNOSIS — M25562 Pain in left knee: Secondary | ICD-10-CM

## 2016-01-10 MED ORDER — METHYLPREDNISOLONE ACETATE 40 MG/ML IJ SUSP
40.0000 mg | Freq: Once | INTRAMUSCULAR | Status: AC
  Administered 2016-01-10: 40 mg via INTRA_ARTICULAR

## 2016-01-10 NOTE — Patient Instructions (Signed)
You have an appt at 9:45 w/ Dr Barbaraann Barthel for the knee and hip pain Call with any questions or concerns Have a great trip!!!

## 2016-01-10 NOTE — Patient Instructions (Signed)
Your left knee pain is primarily due to arthritis. These are the medicines you can take for this: Tylenol '500mg'$  1-2 tabs three times a day for pain. Glucosamine sulfate '750mg'$  twice a day is a supplement that may help. Capsaicin, aspercreme, or biofreeze topically up to four times a day may also help with pain. Cortisone injections are an option - you were given this today. If cortisone injections do not help, there are different types of shots that may help but they take longer to take effect. It's important that you continue to stay active. Straight leg raises, knee extensions 3 sets of 10 once a day (add ankle weight if these become too easy). Consider physical therapy to strengthen muscles around the joint that hurts to take pressure off of the joint itself. Shoe inserts with good arch support may be helpful. Walker or cane if needed. Heat or ice 15 minutes at a time 3-4 times a day as needed to help with pain. Water aerobics and cycling with low resistance are the best two types of exercise for arthritis.  You have trochanteric bursitis. Avoid painful activities as much as possible. Ice over area of pain 3-4 times a day for 15 minutes at a time Hip side raise exercise 3 sets of 10-15 once a day - add weights if this becomes too easy. Stretches - pick 2 and hold for 20-30 seconds x 3 - do once or twice a day. Medicines as noted above. You were given a cortisone shot today. If not improving, can consider physical therapy. Follow up with me in 1 month.

## 2016-01-10 NOTE — Assessment & Plan Note (Signed)
Deteriorated.  Pt has hx of Baker's cyst behind L knee and is again having difficulty w/ pain and ambulation.  B/c she has altered her gait, she is now having difficulty w/ her L trochanteric bursitis.  Due to her renal issues, she is not able to take systemic NSAIDs.  Steroid injxn is likely best option for pt but we do not offer that here in this office.  Will refer to Sports Med for complete evaluation and tx prior to her upcoming trip.  Reviewed supportive care and red flags that should prompt return.  Pt expressed understanding and is in agreement w/ plan.

## 2016-01-10 NOTE — Telephone Encounter (Signed)
Medication filled to pharmacy as requested.   

## 2016-01-10 NOTE — Progress Notes (Signed)
   Subjective:    Patient ID: Susan Pittman, female    DOB: Oct 14, 1941, 75 y.o.   MRN: [id number]  HPI L leg pain- pt reports hx of Baker's cyst in L knee and feels she is having similar sxs.  Also having L hip pain- hx of bursitis.  Pt feels her bursitis is flaring due to the need to alter her gait due to knee pain.  Has a trip next week and is hopeful something can be done prior to make ambulating more enjoyable.  Not able to take NSAIDs due to renal fxn.  Just completed very long pred taper.   Review of Systems For ROS see HPI     Objective:   Physical Exam  Constitutional: She is oriented to person, place, and time. She appears well-developed and well-nourished. No distress.  HENT:  Head: Normocephalic and atraumatic.  Cardiovascular: Intact distal pulses.   Musculoskeletal: She exhibits tenderness (TTP over L posterior knee and L greater trochanteric bursa). She exhibits no edema.  Neurological: She is alert and oriented to person, place, and time.  Ambulating w/ cane  Skin: Skin is warm and dry. No erythema.  Vitals reviewed.         Assessment & Plan:

## 2016-01-11 DIAGNOSIS — M7062 Trochanteric bursitis, left hip: Secondary | ICD-10-CM | POA: Insufficient documentation

## 2016-01-11 NOTE — Assessment & Plan Note (Signed)
2/2 DJD.  Discussed tylenol, glucosamine, topical medications.  Intraarticular injection given today.  Shown home exercises to do daily.  Heat/ice as needed.  Arch supports.  F/u in 1 month.  After informed written consent, patient was seated on exam table. Left knee was prepped with alcohol swab and utilizing anteromedial approach, patient's left knee was injected intraarticularly with 3:1 marcaine: depomedrol. Patient tolerated the procedure well without immediate complications.

## 2016-01-11 NOTE — Progress Notes (Signed)
PCP and consultation requested by: [name], MD  Subjective:   HPI: Patient is a 75 y.o. female here for left knee and hip pain.  Patient reports she's had off and on pain in left knee and left hip that worsened after coming off her prednisone (for IgA nephropathy). Was on this for two years and just off past two months. Pain in left knee is mainly posterior, 0/10 at rest, up to 10/10 and sharp at times. Worse with ambulation. Has a history of a bakers cyst here. Hip pain is lateral, worse and sharp when lying down on this side. Pain here 0/10 at rest, 7/10 at worst. No skin changes, fever, other complaints.  Past Medical History  Diagnosis Date  . Asthma   . Hypertension   . GERD (gastroesophageal reflux disease)   . Allergy   . Hypothyroid   . Osteoporosis   . Gastric ulcer   . Hiatal hernia   . Hyperlipidemia     "don't take RX for it" (11/08/2013)  . Renal artery stenosis ([place])     with stent placement  . Lung mass     superior segment LLL/notes 11/08/2013  . Pneumonia     "I've had it ?3 times" (11/08/2013)  . Arthritis     "knees, hips, back, hands" (11/08/2013)  . DJD (degenerative joint disease)   . Lung nodule/ adenoca > LLobectomy 12/15/13  11/08/2013    PET 11/23/2013 1. Dominant sub solid left lower lobe nodule does not demonstrate significantly increased metabolic activity. However, morphologically, adenocarcinoma is still a concern.   - Spirometry 11/24/13 wnl  - 11/24/2013 T surg eval rec> LLower lobectomy 12/15/2013 for adenoca   . CAD (coronary artery disease), non obstructive by cardiac cath 12/12/13 01/24/2014  . Diastolic CHF ([place])   . CKD (chronic kidney disease), stage III   . Nephropathy   . Basal cell carcinoma     "cut them off face & right shoulder" (11/08/2013)    Current Outpatient Prescriptions on File Prior to Visit  Medication Sig Dispense Refill  . albuterol (PROVENTIL HFA;VENTOLIN HFA) 108 (90 BASE) MCG/ACT inhaler Inhale 1-2 puffs into the lungs  every 6 (six) hours as needed for wheezing or shortness of breath. 18 g 1  . allopurinol (ZYLOPRIM) 100 MG tablet Take 100 mg by mouth 2 (two) times daily. Takes 2 tablets in am    . BYSTOLIC 20 MG TABS TAKE ONE TABLET BY MOUTH ONCE DAILY 30 tablet 3  . Calcium Carb-Cholecalciferol 600-800 MG-UNIT TABS Take 2 tablets by mouth daily.     . cetirizine (ZYRTEC) 10 MG tablet Take 10 mg by mouth at bedtime.     . Ferrous Sulfate (IRON SUPPLEMENT PO) Take 150 mg by mouth 2 (two) times daily.    . fluticasone (FLONASE) 50 MCG/ACT nasal spray USE TWO SPRAY IN EACH NOSTRIL EVERY DAY 16 g 6  . furosemide (LASIX) 40 MG tablet TAKE ONE TABLET BY MOUTH IN THE EVENING UNTIL  WEIGHT  OF  219  IS  REACHED 90 tablet 3  . furosemide (LASIX) 80 MG tablet Take 1 tablet (80 mg total) by mouth every morning. 90 tablet 1  . hydrALAZINE (APRESOLINE) 50 MG tablet TAKE ONE TABLET BY MOUTH TWICE DAILY 60 tablet 3  . Lancets (ONETOUCH ULTRASOFT) lancets Use 1 per day dx code 249.00 100 each 12  . lansoprazole (PREVACID) 15 MG capsule Take 15 mg by mouth daily at 12 noon.    Marland Kitchen levothyroxine (SYNTHROID, LEVOTHROID)  88 MCG tablet TAKE ONE TABLET (88 MG TOTAL) BY MOUTH ONCE DAILY BEFORE BREAKFAST 90 tablet 1  . ONETOUCH DELICA LANCETS 16X MISC Use to check blood sugar once daily as instructed. Dx code: E11.9 100 each 2  . sertraline (ZOLOFT) 50 MG tablet Take 1.5 tablets (75 mg total) by mouth daily. 45 tablet 3   No current facility-administered medications on file prior to visit.    Past Surgical History  Procedure Laterality Date  . Knee arthroscopy Bilateral   . Anterior cervical decomp/discectomy fusion  2000    C5-C6  . Renal artery stent Right 11/08/2013    Archie Endo 11/08/2013  . Tonsillectomy and adenoidectomy  ?1946  . Cholecystectomy  1970's  . Vaginal hysterectomy  1970  . Dilation and curettage of uterus  1960's  . Skin cancer excision    . Coronary angiogram      Hx: of 2/ 2015  . Cardiac catheterization       Hx: of 2/ 2015  . Video bronchoscopy N/A 12/15/2013    Procedure: VIDEO BRONCHOSCOPY;  Surgeon: Grace Isaac, MD;  Location: [facility] OR;  Service: Thoracic;  Laterality: N/A;  . Video assisted thoracoscopy (vats)/wedge resection Left 12/15/2013    Procedure: VIDEO ASSISTED THORACOSCOPY (VATS)/WEDGE RESECTION;  Surgeon: Grace Isaac, MD;  Location: [facility];  Service: Thoracic;  Laterality: Left;  . Lobectomy Left 12/15/2013    Procedure: LOBECTOMY;  Surgeon: Grace Isaac, MD;  Location: [facility];  Service: Thoracic;  Laterality: Left;  . Renal angiogram Bilateral 11/08/2013    Procedure: RENAL ANGIOGRAM;  Surgeon: Lorretta Harp, MD;  Location: [facility] CATH LAB;  Service: Cardiovascular;  Laterality: Bilateral;  . Percutaneous stent intervention Right 11/08/2013    Procedure: PERCUTANEOUS STENT INTERVENTION;  Surgeon: Lorretta Harp, MD;  Location: [facility] CATH LAB;  Service: Cardiovascular;  Laterality: Right;  rt renal artery stent  . Left heart catheterization with coronary angiogram N/A 12/13/2013    Procedure: LEFT HEART CATHETERIZATION WITH CORONARY ANGIOGRAM;  Surgeon: Burnell Blanks, MD;  Location: [facility] CATH LAB;  Service: Cardiovascular;  Laterality: N/A;    Allergies  Allergen Reactions  . Benazepril Hcl Anaphylaxis and Cough  . Loratadine Anaphylaxis    anaphylaxis  . Allegra [Fexofenadine Hcl] Other (See Comments)    Severe back pain  . Celecoxib Other (See Comments)    REACTION: aching  . Lisinopril     REACTION: cough  . Sulfa Antibiotics Hives    Social History   Social History  . Marital Status: Married    Spouse Name: N/A  . Number of Children: N/A  . Years of Education: N/A   Occupational History  . Retired    Social History Main Topics  . Smoking status: Never Smoker   . Smokeless tobacco: Never Used  . Alcohol Use: No  . Drug Use: No  . Sexual Activity: Yes   Other Topics Concern  . Not on file   Social History Narrative    Family History   Problem Relation Age of Onset  . Breast cancer Sister   . Emphysema Father     smoked  . Allergies Sister   . Allergies Brother   . Heart disease Mother   . Heart disease Father   . Diabetes Father   . Diabetes Brother   . Stroke Father   . Heart failure Neg Hx     BP 144/101 mmHg  Pulse 74  Ht '5\' 5"'$  (1.651 m)  Wt 235  lb (106.595 kg)  BMI 39.11 kg/m2  Review of Systems: See HPI above.    Objective:  Physical Exam:  Gen: NAD, comfortable in exam room  Left knee: No gross deformity, ecchymoses, swelling. TTP medial and lateral joint lines. FROM. Negative ant/post drawers. Negative valgus/varus testing. Negative lachmanns. Negative mcmurrays, apleys, patellar apprehension. NV intact distally.  Left hip: No gross deformity, swelling, bruising. TTP greater trochanter. FROM.  Strength 4/5 with hip abduction. Negative logroll. NVI distally.    Assessment & Plan:  1. Left knee pain - 2/2 DJD.  Discussed tylenol, glucosamine, topical medications.  Intraarticular injection given today.  Shown home exercises to do daily.  Heat/ice as needed.  Arch supports.  F/u in 1 month.  After informed written consent, patient was seated on exam table. Left knee was prepped with alcohol swab and utilizing anteromedial approach, patient's left knee was injected intraarticularly with 3:1 marcaine: depomedrol. Patient tolerated the procedure well without immediate complications.  2. Left trochanteric bursitis - Injection given today.  Icing, home exercises and stretches reviewed.  F/u in 1 month.  After informed written consent patient was lying on right side on exam table.  Area overlying left trochanteric bursa prepped with alcohol swab then injected with 6:2 marcaine: depomedrol.  Patient tolerated procedure well without immediate complications.

## 2016-01-11 NOTE — Assessment & Plan Note (Signed)
Injection given today.  Icing, home exercises and stretches reviewed.  F/u in 1 month.  After informed written consent patient was lying on right side on exam table.  Area overlying left trochanteric bursa prepped with alcohol swab then injected with 6:2 marcaine: depomedrol.  Patient tolerated procedure well without immediate complications.

## 2016-01-22 ENCOUNTER — Other Ambulatory Visit: Payer: Medicare Other

## 2016-01-23 ENCOUNTER — Encounter: Payer: Self-pay | Admitting: Family Medicine

## 2016-01-23 ENCOUNTER — Ambulatory Visit (INDEPENDENT_AMBULATORY_CARE_PROVIDER_SITE_OTHER): Payer: Medicare Other | Admitting: Family Medicine

## 2016-01-23 VITALS — BP 128/74 | HR 57 | Temp 99.8°F | Wt 232.0 lb

## 2016-01-23 DIAGNOSIS — J101 Influenza due to other identified influenza virus with other respiratory manifestations: Secondary | ICD-10-CM | POA: Diagnosis not present

## 2016-01-23 DIAGNOSIS — R062 Wheezing: Secondary | ICD-10-CM

## 2016-01-23 DIAGNOSIS — J4520 Mild intermittent asthma, uncomplicated: Secondary | ICD-10-CM | POA: Diagnosis not present

## 2016-01-23 DIAGNOSIS — R52 Pain, unspecified: Secondary | ICD-10-CM

## 2016-01-23 LAB — POC INFLUENZA A&B (BINAX/QUICKVUE)
Influenza A, POC: NEGATIVE
Influenza B, POC: POSITIVE — AB

## 2016-01-23 MED ORDER — OSELTAMIVIR PHOSPHATE 75 MG PO CAPS: 75.0000 mg | ORAL_CAPSULE | Freq: Two times a day (BID) | ORAL | Status: DC

## 2016-01-23 MED ORDER — AZITHROMYCIN 250 MG PO TABS: ORAL_TABLET | ORAL | Status: DC

## 2016-01-23 MED ORDER — ALBUTEROL SULFATE (2.5 MG/3ML) 0.083% IN NEBU
2.5000 mg | INHALATION_SOLUTION | Freq: Once | RESPIRATORY_TRACT | Status: AC
  Administered 2016-01-23: 2.5 mg via RESPIRATORY_TRACT

## 2016-01-23 MED ORDER — METHYLPREDNISOLONE ACETATE 80 MG/ML IJ SUSP
80.0000 mg | Freq: Once | INTRAMUSCULAR | Status: AC
  Administered 2016-01-23: 80 mg via INTRAMUSCULAR

## 2016-01-23 MED ORDER — PREDNISONE 10 MG PO TABS: ORAL_TABLET | ORAL | Status: DC

## 2016-01-23 MED ORDER — HYDROCOD POLST-CPM POLST ER 10-8 MG/5ML PO SUER: 5.0000 mL | Freq: Two times a day (BID) | ORAL | Status: DC | PRN

## 2016-01-23 NOTE — Patient Instructions (Signed)
Influenza Tests WHY AM I HAVING THIS TEST? You may have an influenza test to help your health care provider determine what type of respiratory infection you have. The test may also be used to help determine a treatment plan and to monitor influenza activity within a community. There are two types of influenza virus: types A and B. Often, one strain of type A influenza will be the most common type of influenza in a community during flu season. This is typically between the months of October and May. Influenza tests can help determine which strain of influenza type A is occurring most often in the community. WHAT KIND OF SAMPLE IS TAKEN? Influenza tests are performed by collecting a small sample of fluids (secretions) from your nose or throat using a cotton swab. Tests performed on nasal secretions are more accurate than tests performed on a sample taken from your throat.  Rapid influenza tests are available and have become the most frequently used tests for influenza. They are most accurate when completed within the first 48 hours after your symptoms begin.  Depending on the method, a rapid influenza test may be completed in your health care provider's office in less than 30 minutes. It can also be sent to a lab with the results available the same day.  Depending on the particular type of test used, it can identify influenza type A, a mixture of types A and B, or differentiate between type A and B.  Another test that your health care provider may order is a viral culture. This also requires the collection of secretions from your nose or throat. The sample is then sent to a lab for processing. This may take several days to complete. HOW ARE YOUR TEST RESULTS REPORTED? Your test results will be reported as either positive or negative. A false-negative result can occur. A false-negative result is incorrect because it indicates a condition or finding is not present when it is. It is your responsibility to  obtain your test results. Ask the lab or department performing the test when and how you will get your results. WHAT DO THE RESULTS MEAN?  A positive test means you have influenza. Tests may further determine the type of influenza you have.  A negative influenza test result means it is not likely that you have influenza.  A false-negative result can occur. False-negative results are more likely to happen at the height of the influenza season. Talk with your health care provider to discuss your results, treatment options, and if necessary, the need for more tests. Talk with your health care provider if you have any questions about your results.   This information is not intended to replace advice given to you by your health care provider. Make sure you discuss any questions you have with your health care provider.   Document Released: 07/31/2005 Document Revised: 11/11/2014 Document Reviewed: 03/09/2014 Elsevier Interactive Patient Education 2016 [employer] Inc.  

## 2016-01-24 NOTE — Progress Notes (Signed)
Patient ID: Susan Pittman, female    DOB: 1941-07-07  Age: 75 y.o. MRN: [id number]    Subjective:  Subjective HPI Susan Pittman presents with complaint of fever, chills  , body aches and cough/ wheezing x since Sunday.  No otc meds.   Using albuterol regularly.  No NVD.    Review of Systems  Constitutional: Positive for fever and chills.  HENT: Positive for congestion. Negative for postnasal drip, rhinorrhea and sinus pressure.   Respiratory: Positive for cough, chest tightness, shortness of breath and wheezing.   Cardiovascular: Negative for chest pain, palpitations and leg swelling.  Allergic/Immunologic: Negative for environmental allergies.    History Past Medical History  Diagnosis Date  . Asthma   . Hypertension   . GERD (gastroesophageal reflux disease)   . Allergy   . Hypothyroid   . Osteoporosis   . Gastric ulcer   . Hiatal hernia   . Hyperlipidemia     "don't take RX for it" (11/08/2013)  . Renal artery stenosis ([place])     with stent placement  . Lung mass     superior segment LLL/notes 11/08/2013  . Pneumonia     "I've had it ?3 times" (11/08/2013)  . Arthritis     "knees, hips, back, hands" (11/08/2013)  . DJD (degenerative joint disease)   . Lung nodule/ adenoca > LLobectomy 12/15/13  11/08/2013    PET 11/23/2013 1. Dominant sub solid left lower lobe nodule does not demonstrate significantly increased metabolic activity. However, morphologically, adenocarcinoma is still a concern.   - Spirometry 11/24/13 wnl  - 11/24/2013 T surg eval rec> LLower lobectomy 12/15/2013 for adenoca   . CAD (coronary artery disease), non obstructive by cardiac cath 12/12/13 01/24/2014  . Diastolic CHF ([place])   . CKD (chronic kidney disease), stage III   . Nephropathy   . Basal cell carcinoma     "cut them off face & right shoulder" (11/08/2013)    She has past surgical history that includes Knee arthroscopy (Bilateral); Anterior cervical decomp/discectomy fusion (2000); Renal artery stent (Right,  11/08/2013); Tonsillectomy and adenoidectomy (?1946); Cholecystectomy (1970's); Vaginal hysterectomy (1970); Dilation and curettage of uterus (1960's); Skin cancer excision; Coronary angiogram; Cardiac catheterization; Video bronchoscopy (N/A, 12/15/2013); Video assisted thoracoscopy (vats)/wedge resection (Left, 12/15/2013); Lobectomy (Left, 12/15/2013); renal angiogram (Bilateral, 11/08/2013); percutaneous stent intervention (Right, 11/08/2013); and left heart catheterization with coronary angiogram (N/A, 12/13/2013).   Her family history includes Allergies in her brother and sister; Breast cancer in her sister; Diabetes in her brother and father; Emphysema in her father; Heart disease in her father and mother; Stroke in her father. There is no history of Heart failure.She reports that she has never smoked. She has never used smokeless tobacco. She reports that she does not drink alcohol or use illicit drugs.  Current Outpatient Prescriptions on File Prior to Visit  Medication Sig Dispense Refill  . albuterol (PROVENTIL HFA;VENTOLIN HFA) 108 (90 BASE) MCG/ACT inhaler Inhale 1-2 puffs into the lungs every 6 (six) hours as needed for wheezing or shortness of breath. 18 g 1  . allopurinol (ZYLOPRIM) 100 MG tablet Take 100 mg by mouth 2 (two) times daily. Takes 2 tablets in am    . BYSTOLIC 20 MG TABS TAKE ONE TABLET BY MOUTH ONCE DAILY 30 tablet 3  . Calcium Carb-Cholecalciferol 600-800 MG-UNIT TABS Take 2 tablets by mouth daily.     . cetirizine (ZYRTEC) 10 MG tablet Take 10 mg by mouth at bedtime.     Marland Kitchen  Ferrous Sulfate (IRON SUPPLEMENT PO) Take 150 mg by mouth 2 (two) times daily.    . fluticasone (FLONASE) 50 MCG/ACT nasal spray USE TWO SPRAY IN EACH NOSTRIL EVERY DAY 16 g 6  . furosemide (LASIX) 40 MG tablet TAKE ONE TABLET BY MOUTH IN THE EVENING UNTIL  WEIGHT  OF  219  IS  REACHED 90 tablet 3  . furosemide (LASIX) 80 MG tablet Take 1 tablet (80 mg total) by mouth every morning. 90 tablet 1  . glucose blood  (ONE TOUCH ULTRA TEST) test strip DX: ICD-10-CM: E09.9, T38.0X5A 100 each 0  . hydrALAZINE (APRESOLINE) 50 MG tablet TAKE ONE TABLET BY MOUTH TWICE DAILY 60 tablet 3  . Lancets (ONETOUCH ULTRASOFT) lancets Use 1 per day dx code 249.00 100 each 12  . lansoprazole (PREVACID) 15 MG capsule Take 15 mg by mouth daily at 12 noon.    Marland Kitchen levothyroxine (SYNTHROID, LEVOTHROID) 88 MCG tablet TAKE ONE TABLET (88 MG TOTAL) BY MOUTH ONCE DAILY BEFORE BREAKFAST 90 tablet 1  . ONETOUCH DELICA LANCETS 69G MISC Use to check blood sugar once daily as instructed. Dx code: E11.9 100 each 2  . sertraline (ZOLOFT) 50 MG tablet Take 1.5 tablets (75 mg total) by mouth daily. 45 tablet 3   No current facility-administered medications on file prior to visit.     Objective:  Objective Physical Exam  Constitutional: She is oriented to person, place, and time. She appears well-developed and well-nourished.  HENT:  Right Ear: Tympanic membrane, external ear and ear canal normal.  Left Ear: Tympanic membrane, external ear and ear canal normal.  Nose: Mucosal edema and rhinorrhea present. Right sinus exhibits no maxillary sinus tenderness and no frontal sinus tenderness. Left sinus exhibits no maxillary sinus tenderness and no frontal sinus tenderness.  + PND + errythema  Eyes: Conjunctivae are normal. Right eye exhibits no discharge. Left eye exhibits no discharge.  Cardiovascular: Normal rate, regular rhythm and normal heart sounds.   No murmur heard. Pulmonary/Chest: Effort normal. No respiratory distress. She has wheezes. She has no rales. She exhibits no tenderness.  Musculoskeletal: She exhibits no edema.  Lymphadenopathy:    She has cervical adenopathy.  Neurological: She is alert and oriented to person, place, and time.  Nursing note and vitals reviewed.  BP 128/74 mmHg  Pulse 57  Temp(Src) 99.8 F (37.7 C) (Oral)  Wt 232 lb (105.235 kg)  SpO2 97% Wt Readings from Last 3 Encounters:  01/23/16 232 lb  (105.235 kg)  01/10/16 235 lb (106.595 kg)  01/10/16 235 lb 6 oz (106.765 kg)     Lab Results  Component Value Date   WBC 9.2 08/28/2015   HGB 12.0 08/28/2015   HCT 37.7 08/28/2015   PLT 287 08/28/2015   GLUCOSE 101* 09/07/2015   CHOL 184 09/07/2015   TRIG 180.0* 09/07/2015   HDL 46.80 09/07/2015   LDLDIRECT 126.3 09/10/2012   LDLCALC 101* 09/07/2015   ALT 9 09/07/2015   AST 14 09/07/2015   NA 144 09/07/2015   K 3.8 09/07/2015   CL 101 09/07/2015   CREATININE 1.52* 09/07/2015   BUN 34* 09/07/2015   CO2 35* 09/07/2015   TSH 0.61 06/28/2015   INR 1.01 02/25/2014   HGBA1C 5.2 01/05/2016   MICROALBUR completed 09/07/2015    Mr Abdomen Wo Contrast  10/31/2015  CLINICAL DATA:  Stage 4 chronic kidney disease. Question of a partially visualized renal mass in the medial left mid kidney on a recent unenhanced chest CT. EXAM:  MRI ABDOMEN WITHOUT CONTRAST TECHNIQUE: Multiplanar multisequence MR imaging was performed without the administration of intravenous contrast. COMPARISON:  08/28/2015 chest CT. 11/23/2013 PET-CT. 03/03/2014 renal sonogram. FINDINGS: Lower chest: No gross abnormality on limited views of the lung bases. Hepatobiliary: There is prominent signal loss throughout the liver and spleen on the inphase T1 weighted sequence, most in keeping with transfusion-related iron deposition. There are two T2 hyperintense and T1 hypointense liver lesions in the right liver lobe, largest 1.0 cm in the medial segment 6 right liver lobe (series 9/image 8), which are incompletely characterized but likely to represent benign liver cysts. Status post cholecystectomy. No biliary ductal dilatation. Common bile duct diameter 3 mm. No choledocholithiasis. Pancreas: No pancreatic mass or duct dilation.  No pancreas divisum. Spleen: Normal size. No mass. Adrenals/Urinary Tract: Normal adrenals. No hydronephrosis. There is a 2.2 x 2.2 cm exophytic T2 hyperintense renal cortical mass in the lateral upper  left kidney (series 8/image 5), which demonstrates a lobulated outer contour, thin internal septations and heterogeneous internal regions of T1 hyperintensity (series 11/ image 17, suggesting internal areas of hemorrhagic/proteinaceous change), which was not definitely seen on the 11/23/2013 PET-CT study. There is a mildly lobulated 2.6 x 2.1 cm renal cortical mass in the medial interpolar left kidney (series 8/ image 13), which demonstrates homogeneous mild T2 hyperintensity (less T2 hyperintense compared to the CSF space), which was not definitely seen on the 11/23/2013 PET-CT study. There are at least ten additional scattered T2 hyperintense and T1 hypointense subcentimeter renal cortical lesions in each kidney, which are incompletely characterized on this noncontrast study. Stomach/Bowel: Grossly normal stomach. Visualized small and large bowel is normal caliber, with no bowel wall thickening. Vascular/Lymphatic: Normal caliber abdominal aorta. No pathologically enlarged lymph nodes in the abdomen. Other: No abdominal ascites or focal fluid collection. Musculoskeletal: No aggressive appearing focal osseous lesions. IMPRESSION: 1. Complex septated 2.2 cm renal cortical mass in the lateral upper left kidney with internal areas of hemorrhagic/proteinaceous change. Lobulated 2.6 cm renal cortical mass in the medial interpolar left kidney. These left renal masses were not definitely seen on the 11/23/2013 PET-CT study. These left renal masses are incompletely characterized on this noncontrast MRI study and a renal neoplasm cannot be excluded. If a CT or MRI of the abdomen with and without IV contrast is not clinically feasible due to poor renal function, a short-term follow-up MRI of the abdomen without IV contrast is recommended in 3-6 months. 2. No lymphadenopathy in the abdomen. 3. Iron deposition throughout the liver and spleen, likely transfusion-related. 4. Two small right liver lobe lesions are incompletely  characterized, probably benign liver cysts. Electronically Signed   By: Ilona Sorrel M.D.   On: 10/31/2015 11:03     Assessment & Plan:  Plan I am having Susan Pittman start on predniSONE, oseltamivir, azithromycin, and chlorpheniramine-HYDROcodone. I am also having her maintain her cetirizine, albuterol, Calcium Carb-Cholecalciferol, onetouch ultrasoft, allopurinol, Ferrous Sulfate (IRON SUPPLEMENT PO), fluticasone, ONETOUCH DELICA LANCETS 91Y, lansoprazole, furosemide, levothyroxine, furosemide, sertraline, hydrALAZINE, BYSTOLIC, and glucose blood. We administered albuterol and methylPREDNISolone acetate.  Meds ordered this encounter  Medications  . albuterol (PROVENTIL) (2.5 MG/3ML) 0.083% nebulizer solution 2.5 mg    Sig:   . predniSONE (DELTASONE) 10 MG tablet    Sig: 3 po qd for 3 days then 2 po qd for 3 days the 1 po qd for 3 days    Dispense:  18 tablet    Refill:  0  . oseltamivir (TAMIFLU) 75 MG capsule  Sig: Take 1 capsule (75 mg total) by mouth 2 (two) times daily.    Dispense:  10 capsule    Refill:  0  . azithromycin (ZITHROMAX Z-PAK) 250 MG tablet    Sig: As directed    Dispense:  6 each    Refill:  0  . chlorpheniramine-HYDROcodone (TUSSIONEX PENNKINETIC ER) 10-8 MG/5ML SUER    Sig: Take 5 mLs by mouth every 12 (twelve) hours as needed for cough.    Dispense:  140 mL    Refill:  0  . methylPREDNISolone acetate (DEPO-MEDROL) injection 80 mg    Sig:     Problem List Items Addressed This Visit    None    Visit Diagnoses    Body aches    -  Primary    Relevant Orders    POC Influenza A&B (Binax test) (Completed)    Wheezing        Relevant Medications    albuterol (PROVENTIL) (2.5 MG/3ML) 0.083% nebulizer solution 2.5 mg (Completed)    Influenza B        Relevant Medications    predniSONE (DELTASONE) 10 MG tablet    oseltamivir (TAMIFLU) 75 MG capsule    azithromycin (ZITHROMAX Z-PAK) 250 MG tablet    Bronchitis, asthmatic, mild intermittent, uncomplicated         Relevant Medications    albuterol (PROVENTIL) (2.5 MG/3ML) 0.083% nebulizer solution 2.5 mg (Completed)    predniSONE (DELTASONE) 10 MG tablet    azithromycin (ZITHROMAX Z-PAK) 250 MG tablet    chlorpheniramine-HYDROcodone (TUSSIONEX PENNKINETIC ER) 10-8 MG/5ML SUER    methylPREDNISolone acetate (DEPO-MEDROL) injection 80 mg (Completed)       Follow-up: Return if symptoms worsen or fail to improve.  Garnet Koyanagi, DO

## 2016-01-26 ENCOUNTER — Other Ambulatory Visit: Payer: Self-pay | Admitting: Family Medicine

## 2016-01-26 NOTE — Telephone Encounter (Signed)
Medication filled to pharmacy as requested.   

## 2016-01-29 ENCOUNTER — Ambulatory Visit
Admission: RE | Admit: 2016-01-29 | Discharge: 2016-01-29 | Disposition: A | Discharge status: Home / Self Care | Payer: Medicare Other | Source: Ambulatory Visit | Attending: Nephrology | Admitting: Nephrology

## 2016-01-29 DIAGNOSIS — N2889 Other specified disorders of kidney and ureter: Secondary | ICD-10-CM

## 2016-01-31 ENCOUNTER — Other Ambulatory Visit: Payer: Self-pay | Admitting: General Practice

## 2016-01-31 MED ORDER — GLUCOSE BLOOD VI STRP: ORAL_STRIP | Status: AC

## 2016-02-03 ENCOUNTER — Other Ambulatory Visit: Payer: Self-pay | Admitting: Cardiovascular Disease

## 2016-02-05 DIAGNOSIS — I129 Hypertensive chronic kidney disease with stage 1 through stage 4 chronic kidney disease, or unspecified chronic kidney disease: Secondary | ICD-10-CM | POA: Diagnosis not present

## 2016-02-05 DIAGNOSIS — R93429 Abnormal radiologic findings on diagnostic imaging of unspecified kidney: Secondary | ICD-10-CM | POA: Diagnosis not present

## 2016-02-05 DIAGNOSIS — M109 Gout, unspecified: Secondary | ICD-10-CM | POA: Diagnosis not present

## 2016-02-05 DIAGNOSIS — R918 Other nonspecific abnormal finding of lung field: Secondary | ICD-10-CM | POA: Diagnosis not present

## 2016-02-05 DIAGNOSIS — I701 Atherosclerosis of renal artery: Secondary | ICD-10-CM | POA: Diagnosis not present

## 2016-02-05 DIAGNOSIS — N184 Chronic kidney disease, stage 4 (severe): Secondary | ICD-10-CM | POA: Diagnosis not present

## 2016-02-05 DIAGNOSIS — D649 Anemia, unspecified: Secondary | ICD-10-CM | POA: Diagnosis not present

## 2016-02-05 DIAGNOSIS — N028 Recurrent and persistent hematuria with other morphologic changes: Secondary | ICD-10-CM | POA: Diagnosis not present

## 2016-02-05 NOTE — Telephone Encounter (Signed)
Review for refill, Thank you. 

## 2016-02-09 ENCOUNTER — Encounter: Payer: Self-pay | Admitting: Family Medicine

## 2016-02-09 ENCOUNTER — Ambulatory Visit (INDEPENDENT_AMBULATORY_CARE_PROVIDER_SITE_OTHER): Payer: Medicare Other | Admitting: Family Medicine

## 2016-02-09 VITALS — BP 132/80 | HR 54 | Ht 65.0 in | Wt 227.0 lb

## 2016-02-09 DIAGNOSIS — M25562 Pain in left knee: Secondary | ICD-10-CM

## 2016-02-09 DIAGNOSIS — M7062 Trochanteric bursitis, left hip: Secondary | ICD-10-CM

## 2016-02-09 NOTE — Assessment & Plan Note (Signed)
2/2 DJD.  Clinically much improved following injection.  Previously discussed tylenol, glucosamine, topical medications.  F/u prn.

## 2016-02-09 NOTE — Progress Notes (Signed)
PCP and consultation requested by: [name], MD  Subjective:   HPI: Patient is a 75 y.o. female here for left knee and hip pain.  3/8: Patient reports she's had off and on pain in left knee and left hip that worsened after coming off her prednisone (for IgA nephropathy). Was on this for two years and just off past two months. Pain in left knee is mainly posterior, 0/10 at rest, up to 10/10 and sharp at times. Worse with ambulation. Has a history of a bakers cyst here. Hip pain is lateral, worse and sharp when lying down on this side. Pain here 0/10 at rest, 7/10 at worst. No skin changes, fever, other complaints.  4/7: Patient reports she feels much better. No pain with walking in knee or hip. Pain level 0/10. Some soreness only to touch in these areas and if lying on the left side. No numbness, skin changes.  Past Medical History  Diagnosis Date  . Asthma   . Hypertension   . GERD (gastroesophageal reflux disease)   . Allergy   . Hypothyroid   . Osteoporosis   . Gastric ulcer   . Hiatal hernia   . Hyperlipidemia     "don't take RX for it" (11/08/2013)  . Renal artery stenosis ([place])     with stent placement  . Lung mass     superior segment LLL/notes 11/08/2013  . Pneumonia     "I've had it ?3 times" (11/08/2013)  . Arthritis     "knees, hips, back, hands" (11/08/2013)  . DJD (degenerative joint disease)   . Lung nodule/ adenoca > LLobectomy 12/15/13  11/08/2013    PET 11/23/2013 1. Dominant sub solid left lower lobe nodule does not demonstrate significantly increased metabolic activity. However, morphologically, adenocarcinoma is still a concern.   - Spirometry 11/24/13 wnl  - 11/24/2013 T surg eval rec> LLower lobectomy 12/15/2013 for adenoca   . CAD (coronary artery disease), non obstructive by cardiac cath 12/12/13 01/24/2014  . Diastolic CHF ([place])   . CKD (chronic kidney disease), stage III   . Nephropathy   . Basal cell carcinoma     "cut them off face & right  shoulder" (11/08/2013)    Current Outpatient Prescriptions on File Prior to Visit  Medication Sig Dispense Refill  . albuterol (PROVENTIL HFA;VENTOLIN HFA) 108 (90 BASE) MCG/ACT inhaler Inhale 1-2 puffs into the lungs every 6 (six) hours as needed for wheezing or shortness of breath. 18 g 1  . allopurinol (ZYLOPRIM) 100 MG tablet Take 100 mg by mouth 2 (two) times daily. Takes 2 tablets in am    . azithromycin (ZITHROMAX Z-PAK) 250 MG tablet As directed 6 each 0  . BYSTOLIC 20 MG TABS TAKE ONE TABLET BY MOUTH ONCE DAILY 30 tablet 3  . Calcium Carb-Cholecalciferol 600-800 MG-UNIT TABS Take 2 tablets by mouth daily.     . cetirizine (ZYRTEC) 10 MG tablet Take 10 mg by mouth at bedtime.     . chlorpheniramine-HYDROcodone (TUSSIONEX PENNKINETIC ER) 10-8 MG/5ML SUER Take 5 mLs by mouth every 12 (twelve) hours as needed for cough. 140 mL 0  . Ferrous Sulfate (IRON SUPPLEMENT PO) Take 150 mg by mouth 2 (two) times daily.    . fluticasone (FLONASE) 50 MCG/ACT nasal spray USE TWO SPRAY IN EACH NOSTRIL EVERY DAY 16 g 6  . furosemide (LASIX) 40 MG tablet TAKE ONE TABLET BY MOUTH IN THE EVENING UNTIL  WEIGHT  OF  219  IS  REACHED 90 tablet  3  . furosemide (LASIX) 80 MG tablet TAKE ONE TABLET BY MOUTH IN THE MORNING 90 tablet 0  . glucose blood (ONE TOUCH ULTRA TEST) test strip Dx E09.9. Please use one strip each time sugars are checked. Pt tests 2 times daily. 100 each 6  . hydrALAZINE (APRESOLINE) 50 MG tablet TAKE ONE TABLET BY MOUTH TWICE DAILY 60 tablet 3  . Lancets (ONETOUCH ULTRASOFT) lancets Use 1 per day dx code 249.00 100 each 12  . lansoprazole (PREVACID) 15 MG capsule Take 15 mg by mouth daily at 12 noon.    Marland Kitchen levothyroxine (SYNTHROID, LEVOTHROID) 88 MCG tablet TAKE ONE TABLET (88 MG TOTAL) BY MOUTH ONCE DAILY BEFORE BREAKFAST 90 tablet 1  . ONETOUCH DELICA LANCETS 74Y MISC Use to check blood sugar once daily as instructed. Dx code: E11.9 100 each 2  . oseltamivir (TAMIFLU) 75 MG capsule Take 1  capsule (75 mg total) by mouth 2 (two) times daily. 10 capsule 0  . predniSONE (DELTASONE) 10 MG tablet 3 po qd for 3 days then 2 po qd for 3 days the 1 po qd for 3 days 18 tablet 0  . sertraline (ZOLOFT) 50 MG tablet Take 1.5 tablets (75 mg total) by mouth daily. 45 tablet 3   No current facility-administered medications on file prior to visit.    Past Surgical History  Procedure Laterality Date  . Knee arthroscopy Bilateral   . Anterior cervical decomp/discectomy fusion  2000    C5-C6  . Renal artery stent Right 11/08/2013    Archie Endo 11/08/2013  . Tonsillectomy and adenoidectomy  ?1946  . Cholecystectomy  1970's  . Vaginal hysterectomy  1970  . Dilation and curettage of uterus  1960's  . Skin cancer excision    . Coronary angiogram      Hx: of 2/ 2015  . Cardiac catheterization      Hx: of 2/ 2015  . Video bronchoscopy N/A 12/15/2013    Procedure: VIDEO BRONCHOSCOPY;  Surgeon: Grace Isaac, MD;  Location: [facility] OR;  Service: Thoracic;  Laterality: N/A;  . Video assisted thoracoscopy (vats)/wedge resection Left 12/15/2013    Procedure: VIDEO ASSISTED THORACOSCOPY (VATS)/WEDGE RESECTION;  Surgeon: Grace Isaac, MD;  Location: [place];  Service: Thoracic;  Laterality: Left;  . Lobectomy Left 12/15/2013    Procedure: LOBECTOMY;  Surgeon: Grace Isaac, MD;  Location: [place];  Service: Thoracic;  Laterality: Left;  . Renal angiogram Bilateral 11/08/2013    Procedure: RENAL ANGIOGRAM;  Surgeon: Lorretta Harp, MD;  Location: [facility] CATH LAB;  Service: Cardiovascular;  Laterality: Bilateral;  . Percutaneous stent intervention Right 11/08/2013    Procedure: PERCUTANEOUS STENT INTERVENTION;  Surgeon: Lorretta Harp, MD;  Location: [facility] CATH LAB;  Service: Cardiovascular;  Laterality: Right;  rt renal artery stent  . Left heart catheterization with coronary angiogram N/A 12/13/2013    Procedure: LEFT HEART CATHETERIZATION WITH CORONARY ANGIOGRAM;  Surgeon: Burnell Blanks, MD;  Location: [facility]  CATH LAB;  Service: Cardiovascular;  Laterality: N/A;    Allergies  Allergen Reactions  . Benazepril Hcl Anaphylaxis and Cough  . Loratadine Anaphylaxis    anaphylaxis  . Allegra [Fexofenadine Hcl] Other (See Comments)    Severe back pain  . Celecoxib Other (See Comments)    REACTION: aching  . Lisinopril     REACTION: cough  . Sulfa Antibiotics Hives    Social History   Social History  . Marital Status: Married    Spouse Name: N/A  .  Number of Children: N/A  . Years of Education: N/A   Occupational History  . Retired    Social History Main Topics  . Smoking status: Never Smoker   . Smokeless tobacco: Never Used  . Alcohol Use: No  . Drug Use: No  . Sexual Activity: Yes   Other Topics Concern  . Not on file   Social History Narrative    Family History  Problem Relation Age of Onset  . Breast cancer Sister   . Emphysema Father     smoked  . Allergies Sister   . Allergies Brother   . Heart disease Mother   . Heart disease Father   . Diabetes Father   . Diabetes Brother   . Stroke Father   . Heart failure Neg Hx     BP 132/80 mmHg  Pulse 54  Ht '5\' 5"'$  (1.651 m)  Wt 227 lb (102.967 kg)  BMI 37.77 kg/m2  Review of Systems: See HPI above.    Objective:  Physical Exam:  Gen: NAD, comfortable in exam room  Left knee: No gross deformity, ecchymoses, swelling. Mild TTP medial and lateral joint lines. FROM. Negative ant/post drawers. Negative valgus/varus testing. Negative lachmanns. Negative mcmurrays, apleys, patellar apprehension. NV intact distally.  Left hip: No gross deformity, swelling, bruising. Mild TTP greater trochanter. FROM.  Negative logroll. NVI distally.    Assessment & Plan:  1. Left knee pain - 2/2 DJD.  Clinically much improved following injection.  Previously discussed tylenol, glucosamine, topical medications.  F/u prn.  2. Left trochanteric bursitis - doing well after injection here too.  Mild tenderness and some  soreness lying on this side for a long time only.  Continue home exercises and stretches, f/u prn.

## 2016-02-09 NOTE — Assessment & Plan Note (Signed)
doing well after injection here too.  Mild tenderness and some soreness lying on this side for a long time only.  Continue home exercises and stretches, f/u prn.

## 2016-02-12 ENCOUNTER — Other Ambulatory Visit: Payer: Self-pay | Admitting: Family Medicine

## 2016-02-12 NOTE — Telephone Encounter (Signed)
Medication filled to pharmacy as requested.   

## 2016-02-21 ENCOUNTER — Telehealth: Payer: Self-pay | Admitting: General Practice

## 2016-02-21 ENCOUNTER — Other Ambulatory Visit (HOSPITAL_BASED_OUTPATIENT_CLINIC_OR_DEPARTMENT_OTHER): Payer: Medicare Other

## 2016-02-21 ENCOUNTER — Ambulatory Visit (HOSPITAL_COMMUNITY)
Admission: RE | Admit: 2016-02-21 | Discharge: 2016-02-21 | Disposition: A | Discharge status: Home / Self Care | Payer: Medicare Other | Source: Ambulatory Visit | Attending: Internal Medicine | Admitting: Internal Medicine

## 2016-02-21 ENCOUNTER — Encounter (HOSPITAL_COMMUNITY): Payer: Self-pay

## 2016-02-21 DIAGNOSIS — R918 Other nonspecific abnormal finding of lung field: Secondary | ICD-10-CM | POA: Diagnosis not present

## 2016-02-21 DIAGNOSIS — C3432 Malignant neoplasm of lower lobe, left bronchus or lung: Secondary | ICD-10-CM | POA: Insufficient documentation

## 2016-02-21 DIAGNOSIS — Z902 Acquired absence of lung [part of]: Secondary | ICD-10-CM | POA: Diagnosis not present

## 2016-02-21 LAB — COMPREHENSIVE METABOLIC PANEL
ALBUMIN: 3.4 g/dL — AB (ref 3.5–5.0)
ALK PHOS: 107 U/L (ref 40–150)
ALT: <9 U/L (ref 0–55)
AST: 12 U/L (ref 5–34)
Anion Gap: 11 mEq/L (ref 3–11)
BILIRUBIN TOTAL: 0.37 mg/dL (ref 0.20–1.20)
BUN: 41.5 mg/dL — AB (ref 7.0–26.0)
CO2: 29 mEq/L (ref 22–29)
Calcium: 9.5 mg/dL (ref 8.4–10.4)
Chloride: 105 mEq/L (ref 98–109)
Creatinine: 1.4 mg/dL — ABNORMAL HIGH (ref 0.6–1.1)
EGFR: 36 mL/min/{1.73_m2} — ABNORMAL LOW (ref >90)
GLUCOSE: 94 mg/dL (ref 70–140)
Potassium: 3.9 mEq/L (ref 3.5–5.1)
SODIUM: 145 meq/L (ref 136–145)
TOTAL PROTEIN: 6.8 g/dL (ref 6.4–8.3)

## 2016-02-21 LAB — CBC WITH DIFFERENTIAL/PLATELET
BASO%: 0.1 % (ref 0.0–2.0)
Basophils Absolute: 0 10*3/uL (ref 0.0–0.1)
EOS%: 1.7 % (ref 0.0–7.0)
Eosinophils Absolute: 0.1 10*3/uL (ref 0.0–0.5)
HCT: 40 % (ref 34.8–46.6)
HEMOGLOBIN: 12.5 g/dL (ref 11.6–15.9)
LYMPH%: 15.8 % (ref 14.0–49.7)
MCH: 27.5 pg (ref 25.1–34.0)
MCHC: 31.3 g/dL — ABNORMAL LOW (ref 31.5–36.0)
MCV: 88.1 fL (ref 79.5–101.0)
MONO#: 0.6 10*3/uL (ref 0.1–0.9)
MONO%: 6.6 % (ref 0.0–14.0)
NEUT%: 75.8 % (ref 38.4–76.8)
NEUTROS ABS: 6.3 10*3/uL (ref 1.5–6.5)
Platelets: 272 10*3/uL (ref 145–400)
RBC: 4.54 10*6/uL (ref 3.70–5.45)
RDW: 17.4 % — AB (ref 11.2–14.5)
WBC: 8.4 10*3/uL (ref 3.9–10.3)
lymph#: 1.3 10*3/uL (ref 0.9–3.3)

## 2016-02-21 NOTE — Telephone Encounter (Signed)
Spoke with patient and advised her that per Medicare that the quantity of test strips that she receives will have to be limited. Pt was agreeable and noted that she would look for the change at time of next refill.

## 2016-02-28 ENCOUNTER — Other Ambulatory Visit: Payer: Self-pay | Admitting: Family Medicine

## 2016-02-28 ENCOUNTER — Encounter: Payer: Self-pay | Admitting: Internal Medicine

## 2016-02-28 ENCOUNTER — Telehealth: Payer: Self-pay | Admitting: Internal Medicine

## 2016-02-28 ENCOUNTER — Ambulatory Visit (HOSPITAL_BASED_OUTPATIENT_CLINIC_OR_DEPARTMENT_OTHER): Payer: Medicare Other | Admitting: Internal Medicine

## 2016-02-28 ENCOUNTER — Encounter: Payer: Self-pay | Admitting: *Deleted

## 2016-02-28 VITALS — BP 152/62 | HR 64 | Temp 98.3°F | Resp 18 | Ht 65.0 in | Wt 229.6 lb

## 2016-02-28 DIAGNOSIS — Z85118 Personal history of other malignant neoplasm of bronchus and lung: Secondary | ICD-10-CM

## 2016-02-28 DIAGNOSIS — Q6102 Congenital multiple renal cysts: Secondary | ICD-10-CM | POA: Diagnosis not present

## 2016-02-28 DIAGNOSIS — C3432 Malignant neoplasm of lower lobe, left bronchus or lung: Secondary | ICD-10-CM

## 2016-02-28 NOTE — Telephone Encounter (Signed)
per pof to sch pt appt-gave pt copy of avs-adv central sch will call ot sch trmt

## 2016-02-28 NOTE — Telephone Encounter (Signed)
Medication filled to pharmacy as requested.   

## 2016-02-28 NOTE — Progress Notes (Signed)
Oncology Nurse Navigator Documentation  Oncology Nurse Navigator Flowsheets 02/28/2016  Navigator Encounter Type Clinic/MDC  Treatment Phase Other  Barriers/Navigation Needs No barriers at this time  Interventions None required  Acuity Level 1  Acuity Level 1 Minimal follow up required  Time Spent with Patient 58   Spoke with patient and husband today.  She is observation.  She is doing well.  No barriers identified at this time

## 2016-02-28 NOTE — Progress Notes (Signed)
[place] [phone]   [phone]  OFFICE PROGRESS NOTE  Susan Asa, MD [address] [place] [id number]  DIAGNOSIS: Stage IA (T1a, N0, M0) non-small cell lung cancer, adenocarcinoma diagnosed in January of 2015.  PRIOR THERAPY: Bronchoscopy, left video-assisted thoracoscopy, with wedge resection for biopsy of left lower lobe lesion, completion left lower lobectomy, lymph node dissection under the care of Dr. Servando Snare  CURRENT THERAPY: Observation.  INTERVAL HISTORY: Susan Pittman 75 y.o. female returns to the clinic today for followup visit accompanied by her husband. She has some dull aching pain at the left lower rib cage likely secondary to her previous surgery. She denied having any significant shortness of breath, cough or hemoptysis. She denied having any significant nausea or vomiting. No significant weight loss or night sweats. He has no fever or chills. The patient had repeat CT scan of the chest performed recently and she is here for evaluation and discussion of her scan results.  MEDICAL HISTORY: Past Medical History  Diagnosis Date  . Asthma   . Hypertension   . GERD (gastroesophageal reflux disease)   . Allergy   . Hypothyroid   . Osteoporosis   . Gastric ulcer   . Hiatal hernia   . Hyperlipidemia     "don't take RX for it" (11/08/2013)  . Renal artery stenosis ([place])     with stent placement  . Lung mass     superior segment LLL/notes 11/08/2013  . Pneumonia     "I've had it ?3 times" (11/08/2013)  . Arthritis     "knees, hips, back, hands" (11/08/2013)  . DJD (degenerative joint disease)   . Lung nodule/ adenoca > LLobectomy 12/15/13  11/08/2013    PET 11/23/2013 1. Dominant sub solid left lower lobe nodule does not demonstrate significantly increased metabolic activity. However, morphologically, adenocarcinoma is still a concern.   - Spirometry 11/24/13 wnl  - 11/24/2013 T surg eval rec> LLower lobectomy 12/15/2013  for adenoca   . CAD (coronary artery disease), non obstructive by cardiac cath 12/12/13 01/24/2014  . Diastolic CHF ([place])   . CKD (chronic kidney disease), stage III   . Nephropathy   . Basal cell carcinoma     "cut them off face & right shoulder" (11/08/2013)    ALLERGIES:  is allergic to benazepril hcl; loratadine; allegra; celecoxib; lisinopril; and sulfa antibiotics.  MEDICATIONS:  Current Outpatient Prescriptions  Medication Sig Dispense Refill  . albuterol (PROVENTIL HFA;VENTOLIN HFA) 108 (90 BASE) MCG/ACT inhaler Inhale 1-2 puffs into the lungs every 6 (six) hours as needed for wheezing or shortness of breath. 18 g 1  . allopurinol (ZYLOPRIM) 100 MG tablet Take 100 mg by mouth 2 (two) times daily. Takes 2 tablets in am    . BYSTOLIC 20 MG TABS TAKE ONE TABLET BY MOUTH ONCE DAILY 30 tablet 3  . Calcium Carb-Cholecalciferol 600-800 MG-UNIT TABS Take 2 tablets by mouth daily.     . cetirizine (ZYRTEC) 10 MG tablet Take 10 mg by mouth at bedtime.     . fluticasone (FLONASE) 50 MCG/ACT nasal spray USE TWO SPRAY(S) IN EACH NOSTRIL ONCE DAILY 16 g 2  . furosemide (LASIX) 40 MG tablet TAKE ONE TABLET BY MOUTH IN THE EVENING UNTIL  WEIGHT  OF  219  IS  REACHED 90 tablet 3  . furosemide (LASIX) 80 MG tablet TAKE ONE TABLET BY MOUTH IN THE MORNING 90 tablet 0  . glucose blood (  ONE TOUCH ULTRA TEST) test strip Dx E09.9. Please use one strip each time sugars are checked. Pt tests 2 times daily. 100 each 6  . hydrALAZINE (APRESOLINE) 50 MG tablet TAKE ONE TABLET BY MOUTH TWICE DAILY 60 tablet 3  . Lancets (ONETOUCH ULTRASOFT) lancets Use 1 per day dx code 249.00 100 each 12  . lansoprazole (PREVACID) 15 MG capsule Take 15 mg by mouth daily at 12 noon.    [name] DELICA LANCETS 40J MISC Use to check blood sugar once daily as instructed. Dx code: E11.9 100 each 2  . sertraline (ZOLOFT) 50 MG tablet TAKE ONE & ONE-HALF TABLETS BY MOUTH ONCE DAILY 45 tablet 6  . chlorpheniramine-HYDROcodone (TUSSIONEX  PENNKINETIC ER) 10-8 MG/5ML SUER Take 5 mLs by mouth every 12 (twelve) hours as needed for cough. (Patient not taking: Reported on 02/28/2016) 140 mL 0  . levothyroxine (SYNTHROID, LEVOTHROID) 88 MCG tablet TAKE ONE TABLET BY MOUTH ONCE DAILY BEFORE  BREAKFAST 90 tablet 0   No current facility-administered medications for this visit.    SURGICAL HISTORY:  Past Surgical History  Procedure Laterality Date  . Knee arthroscopy Bilateral   . Anterior cervical decomp/discectomy fusion  2000    C5-C6  . Renal artery stent Right 11/08/2013    Archie Endo 11/08/2013  . Tonsillectomy and adenoidectomy  ?1946  . Cholecystectomy  1970's  . Vaginal hysterectomy  1970  . Dilation and curettage of uterus  1960's  . Skin cancer excision    . Coronary angiogram      Hx: of 2/ 2015  . Cardiac catheterization      Hx: of 2/ 2015  . Video bronchoscopy N/A 12/15/2013    Procedure: VIDEO BRONCHOSCOPY;  Surgeon: Grace Isaac, MD;  Location: [facility] OR;  Service: Thoracic;  Laterality: N/A;  . Video assisted thoracoscopy (vats)/wedge resection Left 12/15/2013    Procedure: VIDEO ASSISTED THORACOSCOPY (VATS)/WEDGE RESECTION;  Surgeon: Grace Isaac, MD;  Location: [place];  Service: Thoracic;  Laterality: Left;  . Lobectomy Left 12/15/2013    Procedure: LOBECTOMY;  Surgeon: Grace Isaac, MD;  Location: [place];  Service: Thoracic;  Laterality: Left;  . Renal angiogram Bilateral 11/08/2013    Procedure: RENAL ANGIOGRAM;  Surgeon: Lorretta Harp, MD;  Location: [facility] CATH LAB;  Service: Cardiovascular;  Laterality: Bilateral;  . Percutaneous stent intervention Right 11/08/2013    Procedure: PERCUTANEOUS STENT INTERVENTION;  Surgeon: Lorretta Harp, MD;  Location: [facility] CATH LAB;  Service: Cardiovascular;  Laterality: Right;  rt renal artery stent  . Left heart catheterization with coronary angiogram N/A 12/13/2013    Procedure: LEFT HEART CATHETERIZATION WITH CORONARY ANGIOGRAM;  Surgeon: Burnell Blanks, MD;   Location: [facility] C[facility] LAB;  Service: Cardiovascular;  Laterality: N/A;    REVIEW OF SYSTEMS:  A comprehensive review of systems was negative except for: Respiratory: positive for pleurisy/chest pain   PHYSICAL EXAMINATION: General appearance: alert, cooperative and no distress Head: Normocephalic, without obvious abnormality, atraumatic Neck: no adenopathy, no JVD, supple, symmetrical, trachea midline and thyroid not enlarged, symmetric, no tenderness/mass/nodules Lymph nodes: Cervical, supraclavicular, and axillary nodes normal. Resp: clear to auscultation bilaterally Back: symmetric, no curvature. ROM normal. No CVA tenderness. Cardio: regular rate and rhythm, S1, S2 normal, no murmur, click, rub or gallop GI: soft, non-tender; bowel sounds normal; no masses,  no organomegaly Extremities: extremities normal, atraumatic, no cyanosis or edema  ECOG PERFORMANCE STATUS: 1 - Symptomatic but completely ambulatory  Blood pressure 152/62, pulse 64, temperature 98.3  F (36.8 C), temperature source Oral, resp. rate 18, height '5\' 5"'$  (1.651 m), weight 229 lb 9.6 oz (104.146 kg), SpO2 98 %.  LABORATORY DATA: Lab Results  Component Value Date   WBC 8.4 02/21/2016   HGB 12.5 02/21/2016   HCT 40.0 02/21/2016   MCV 88.1 02/21/2016   PLT 272 02/21/2016      Chemistry      Component Value Date/Time   NA 145 02/21/2016 0746   NA 144 09/07/2015 0906   K 3.9 02/21/2016 0746   K 3.8 09/07/2015 0906   CL 101 09/07/2015 0906   CO2 29 02/21/2016 0746   CO2 35* 09/07/2015 0906   BUN 41.5* 02/21/2016 0746   BUN 34* 09/07/2015 0906   CREATININE 1.4* 02/21/2016 0746   CREATININE 1.52* 09/07/2015 0906   CREATININE 2.34* 02/14/2014 1030      Component Value Date/Time   CALCIUM 9.5 02/21/2016 0746   CALCIUM 9.7 09/07/2015 0906   ALKPHOS 107 02/21/2016 0746   ALKPHOS 96 09/07/2015 0906   AST 12 02/21/2016 0746   AST 14 09/07/2015 0906   ALT <9 02/21/2016 0746   ALT 9 09/07/2015 0906   BILITOT 0.37  02/21/2016 0746   BILITOT 0.5 09/07/2015 0906       RADIOGRAPHIC STUDIES: Ct Chest Wo Contrast  02/21/2016  CLINICAL DATA:  Lung cancer diagnosed in January 2015 status post left lower lobectomy. EXAM: CT CHEST WITHOUT CONTRAST TECHNIQUE: Multidetector CT imaging of the chest was performed following the standard protocol without IV contrast. COMPARISON:  Chest CT 08/28/2015, 02/10/2015 and 11/05/2013. FINDINGS: Mediastinum/Nodes: There are no enlarged mediastinal, hilar or axillary lymph nodes.Small mediastinal lymph nodes are unchanged. Hilar assessment is limited by the lack of intravenous contrast, although the hilar contours appear unchanged. There is a stable small hiatal hernia. Small tracheal polyps appear unchanged. The heart size is normal. There is no pericardial effusion. Mitral annular calcifications and atherosclerosis of the aorta, great vessels and coronary arteries noted. Lungs/Pleura: There is no pleural effusion. Status post left lower lobe resection. The left lung has a stable appearance without suspicious nodules. There are several stable small pulmonary nodules in the right lung, primarily subpleural in location. Some of these are more distinct on the current examination due to the use of the smaller slice thickness. Previously referenced nodules include a 4 mm nodule along the superior aspect of the major fissure on image 56 which is unchanged. In addition, there are focal ground-glass opacities measure 11 mm in the right lower lobe on image 75 of series 6 and in the right middle lobe measuring 9 mm on image 76. These appear stable from the baseline examination of 2015 and demonstrate no solid components. Upper abdomen: The visualized upper abdomen has a stable appearance without suspicious findings. There is a stable 9 mm cyst in the right hepatic lobe on image 103 and stable left renal cysts. These have been evaluated by recent MRI. Musculoskeletal/Chest wall: There is no chest wall  mass or suspicious osseous finding. IMPRESSION: 1. Stable chest CT status post left lower lobe resection. No evidence of local recurrence or metastatic disease. 2. Stable pulmonary nodularity consistent with a benign etiology based on stability. There are stable ground-glass nodules in the right lung from baseline examination of 2015. Follow up by CT is recommended in 12 months, with continued annual surveillance for a minimum of 3 years. These recommendations are taken from: Recommendations for the Management of Subsolid Pulmonary Nodules Detected at CT: A Statement  from the [facility] 2013; 266:1, 304-317. 3. Stable additional incidental findings. Electronically Signed   By: Richardean Sale M.D.   On: 02/21/2016 08:59   Mr Abdomen Wo Contrast  01/29/2016  CLINICAL DATA:  Left-sided renal mass. Chronic kidney disease, stage IV. EXAM: MRI ABDOMEN WITHOUT CONTRAST TECHNIQUE: Multiplanar multisequence MR imaging was performed without the administration of intravenous contrast. COMPARISON:  10/26/2015 FINDINGS: Exam degradation, including secondary to patient body habitus lack of IV contrast. Lower chest:  Mild cardiomegaly.  Left hemidiaphragm elevation. Hepatobiliary: Resolution of previously described hemosiderosis. Two right hepatic lobe T2 hyperintense lesions are similar and likely cysts. No suspicious liver lesion. Cholecystectomy, without biliary ductal dilatation. Pancreas: Suspicion of an 8 mm dorsal pancreatic body simple appearing cystic lesion image 8/ series 9, similar. No main pancreatic duct dilatation or evidence of acute pancreatitis. Spleen: Normal in size, without focal abnormality. Adrenals/Urinary Tract: Normal adrenal glands. Within both kidneys there are small T2 hyperintense lesions which are likely cysts. Within the upper pole left kidney, a macro lobulated lesion measures 2.4 x 1.9 cm on coronal image 18/ series 3. Compare 2.4 x 2.0 cm at the same level on the prior exam  (when remeasured). 2.3 x 2.1 cm on transverse image 6/ series 9. Compare 2.2 x 2.2 cm at the same level on the prior exam (when remeasured). This demonstrates increased relatively diffuse mild T1 hyperintensity today, including on image 19/series 10. Medial interpolar left renal lesion measures 2.5 x 2.0 cm on image 14/ series 9. Compare 2.6 x 2.1 cm on the prior. Demonstrates new T1 isointensity to the remainder the kidney on image 39/series 10. No hydronephrosis. Stomach/Bowel: Normal stomach and abdominal bowel loops. Vascular/Lymphatic: Normal caliber of the aorta and branch vessels. No abdominal adenopathy. Other: No ascites. Musculoskeletal: No acute osseous abnormality. IMPRESSION: 1. Similar size of an upper pole left renal lesion which demonstrates increased complexity as evidenced by T1 hyperintensity. Favored to represent a hemorrhagic/proteinaceous cyst. Incompletely characterized without IV contrast. Given approximately 4 months of stability, a potential clinical strategy includes follow-up MRI at 9-12 months. 2. An interpolar left renal lesion is felt to represent a minimally complex cyst and is unchanged in size since the prior exam. 3. Suspicion of a dorsal pancreatic body cystic lesion, most likely a pseudocyst. Unchanged. Recommend attention on follow-up. 4. Decreased sensitivity and specificity exam due to technique related factors, as described above. Electronically Signed   By: Abigail Miyamoto M.D.   On: 01/29/2016 15:36   ASSESSMENT AND PLAN: Ms. a very pleasant 75 years old white female with history of stage IA non-small cell lung cancer status post left lower lobectomy with lymph node dissection.  The patient has no complaints today. Her recent CT scan of the chest showed no evidence for disease recurrence. I discussed the scan results with the patient today. I recommended for her to continue on observation with repeat CT scan of the chest in 12 months for evaluation of her disease. For  the bilateral renal cysts, the patient is followed by nephrology and she had MRI of the abdomen performed recently that showed suspicious hematologic/proteinaceous cyst. She was advised to call immediately if she has any concerning symptoms in the interval. The patient voices understanding of current disease status and treatment options and is in agreement with the current care plan.  All questions were answered. The patient knows to call the clinic with any problems, questions or concerns. We can certainly see the patient much sooner if necessary.  Disclaimer: This note was dictated with voice recognition software. Similar sounding words can inadvertently be transcribed and may not be corrected upon review.

## 2016-03-14 ENCOUNTER — Encounter: Payer: Self-pay | Admitting: Cardiothoracic Surgery

## 2016-03-14 ENCOUNTER — Ambulatory Visit (INDEPENDENT_AMBULATORY_CARE_PROVIDER_SITE_OTHER): Payer: Medicare Other | Admitting: Cardiothoracic Surgery

## 2016-03-14 VITALS — BP 145/72 | HR 54 | Resp 20 | Ht 65.0 in | Wt 227.0 lb

## 2016-03-14 DIAGNOSIS — J9811 Atelectasis: Secondary | ICD-10-CM | POA: Diagnosis not present

## 2016-03-14 DIAGNOSIS — C3432 Malignant neoplasm of lower lobe, left bronchus or lung: Secondary | ICD-10-CM | POA: Diagnosis not present

## 2016-03-14 DIAGNOSIS — Z902 Acquired absence of lung [part of]: Secondary | ICD-10-CM

## 2016-03-14 NOTE — Progress Notes (Signed)
[address]       [place] [address]             [phone]                       Ishika L Dadisman Gerton Medical Record #[id number] Date of Birth: 11-17-1940  Referring QH:UTMLYYT, Julien Nordmann, MD Primary Cardiology:Dr Gwenlyn Found Primary Care:Katherine [name], MD  Chief Complaint:   PostOp Follow Up Visit 12/15/2013  OPERATIVE REPORT  PREOPERATIVE DIAGNOSIS: Left lower lobe lung mass.  POSTOPERATIVE DIAGNOSIS: Left lower lobe lung mass. Adenocarcinoma by  frozen section.  PROCEDURE PERFORMED: Bronchoscopy, left video-assisted thoracoscopy,  with wedge resection for biopsy of left lower lobe lesion, completion  left lower lobectomy, lymph node dissection, placement of On-Q device.  SURGEON: Lanelle Bal, MD   Lung cancer, left lower lobe   Primary site: Lung (Left)   Staging method: AJCC 7th Edition   Pathologic: Stage IA (T1a, N0, cM0) signed by Grace Isaac, MD on 12/17/2013  8:37 AM   Summary: Stage IA (T1a, N0, cM0)  History of Present Illness:     Patient returns to the office today after left lower lobectomy for stage I adenocarcinoma the lung.  Has had marked decrease in peripheral edema compared to previous visits. No hemoptysis, respiratory status stable  History  Smoking status  . Never Smoker   Smokeless tobacco  . Never Used       Allergies  Allergen Reactions  . Benazepril Hcl Anaphylaxis and Cough  . Loratadine Anaphylaxis    anaphylaxis  . Allegra [Fexofenadine Hcl] Other (See Comments)    Severe back pain  . Celecoxib Other (See Comments)    REACTION: aching  . Lisinopril     REACTION: cough  . Sulfa Antibiotics Hives    Current Outpatient Prescriptions  Medication Sig Dispense Refill  . albuterol (PROVENTIL HFA;VENTOLIN HFA) 108 (90 BASE) MCG/ACT inhaler Inhale 1-2 puffs into the lungs every 6 (six) hours as needed for wheezing or shortness of breath. 18 g 1  . allopurinol (ZYLOPRIM) 100 MG tablet Take 100  mg by mouth 2 (two) times daily. Takes 2 tablets in am    . BYSTOLIC 20 MG TABS TAKE ONE TABLET BY MOUTH ONCE DAILY 30 tablet 3  . Calcium Carb-Cholecalciferol 600-800 MG-UNIT TABS Take 2 tablets by mouth daily.     . cetirizine (ZYRTEC) 10 MG tablet Take 10 mg by mouth at bedtime.     . fluticasone (FLONASE) 50 MCG/ACT nasal spray USE TWO SPRAY(S) IN EACH NOSTRIL ONCE DAILY 16 g 2  . furosemide (LASIX) 40 MG tablet TAKE ONE TABLET BY MOUTH IN THE EVENING UNTIL  WEIGHT  OF  219  IS  REACHED 90 tablet 3  . furosemide (LASIX) 80 MG tablet TAKE ONE TABLET BY MOUTH IN THE MORNING 90 tablet 0  . glucose blood (ONE TOUCH ULTRA TEST) test strip Dx E09.9. Please use one strip each time sugars are checked. Pt tests 2 times daily. 100 each 6  . hydrALAZINE (APRESOLINE) 50 MG tablet TAKE ONE TABLET BY MOUTH TWICE DAILY 60 tablet 3  . Lancets (ONETOUCH ULTRASOFT) lancets Use 1 per day dx code 249.00 100 each 12  . lansoprazole (PREVACID) 15 MG capsule Take 15 mg by mouth daily at 12 noon.    Marland Kitchen levothyroxine (SYNTHROID, LEVOTHROID) 88 MCG tablet TAKE ONE TABLET BY MOUTH ONCE DAILY BEFORE  BREAKFAST 90  tablet 0  . ONETOUCH DELICA LANCETS 37J MISC Use to check blood sugar once daily as instructed. Dx code: E11.9 100 each 2  . sertraline (ZOLOFT) 50 MG tablet TAKE ONE & ONE-HALF TABLETS BY MOUTH ONCE DAILY 45 tablet 6   No current facility-administered medications for this visit.       Physical Exam: BP 145/72 mmHg  Pulse 54  Resp 20  Ht '5\' 5"'$  (1.651 m)  Wt 227 lb (102.967 kg)  BMI 37.77 kg/m2  SpO2 98%  General appearance: alert and cooperative Neurologic: intact Heart: regular rate and rhythm, S1, S2 normal, no murmur, click, rub or gallop Lungs: diminished breath sounds LLL Abdomen: soft, non-tender; bowel sounds normal; no masses,  no organomegaly Extremities: extremities normal, atraumatic, no cyanosis , Homans sign is negative, patient has 2+ bilateral pedal edema but improved from previous  exam  Wound: The larger incision and anterior chest tube site are well-healed. Chest tube site is completely healed the other incisions are totally healed without evidence of infection Patient has no cervical or supraclavicular adenopathy  the patient is walking with a cane still  Diagnostic Studies & Laboratory data:       Diagnosis: GMS stain demonstrates abundant microorganisms present within the hyalinized nodules with morphology consistent with Histoplasmosis capsulatum. 1. Lung, wedge biopsy/resection, Left lower lobe - INVASIVE ADENOCARCINOMA SEE COMMENT. - TUMOR INVOLVES SURGICAL MARGIN. - NO LYMPHOVASCULAR INVASION IDENTIFIED. - SEE TUMOR SYNOPTIC TEMPLATE BELOW. 2. Lymph node, biopsy, 8 node - ONE LYMPH NODE, NEGATIVE FOR TUMOR (0/1), SEE COMMENT. 3. Lymph node, biopsy, 11 L - ONE LYMPH NODE, NEGATIVE FOR TUMOR (0/1). 4. Lung, resection (segmental or lobe), Left lower - BENIGN LUNG, SEE COMMENT. - NEGATIVE FOR ATYPIA OR MALIGNANCY. - BRONCHOVASCULAR MARGIN, NEGATIVE FOR ATYPIA OR MALIGNANCY. 5. Lymph node, biopsy, 10 node - ONE LYMPH NODE, NEGATIVE FOR TUMOR (0/1). 6. Lymph node, biopsy, 12 node - ONE LYMPH NODE, NEGATIVE FOR TUMOR (0/1). Microscopic Comment 1. LUNG 1 of 3 FINAL for XANTHE, COUILLARD ([id number]) Microscopic Comment(continued) Specimen, including laterality: Left lower lobe (parts 1 and 4). Procedure: Wedge resection and lobectomy Specimen integrity (intact/disrupted): Intact Tumor site: Subpleural Maximum tumor size (cm): 1.7 cm Histologic type: Adenocarcinoma (acinar and lepidic subtypes) Grade: I/well differentiated Margins: Present at margins, see comment. Distance to closest margin (cm): Present at margin Visceral pleura invasion: Absent Tumor extension: Tumor is confined to subpleural parenchyma Treatment effect (if treated with neoadjuvant therapy): None Lymph -Vascular invasion: Absent Lymph nodes: Number examined - 4; Number N1 nodes  positive - 0; Number N2 nodes positive - 0 TNM code: pT1a, pN0, pMX Ancillary studies: Can be performed upon request Non-neoplastic lung: See part 4. Comments: The final surgical margin is part 4. 2. Sections of lymph node demonstrate multiple foci of extensively hyalinized and calcified intranodal nodules. AFB, PAS and GMS stains are pending and will be reported in an addendum. 4. There is no tumor grossly identified. Representative sections of the lung demonstrate non-neoplastic findings to include minimal chronic interstitial inflammation with rare epithelioid granuloma, anthracotic pigment deposition, minimal peribronchiolar chronic inflammation, Langerhans cell histiocytic nodules, and an incidental 3 mm subpleural minute pulmonary meningothelial-like nodule (chemodectoma). The chemodectoma is an incidental, benign finding of no prognostic significance. There are no features of epithelial dysplasia or malignancy present. (CR:kh 12-16-13) [place]i RUND DO    Recent Radiology Findings Ct Chest Wo Contrast  02/21/2016  CLINICAL DATA:  Lung cancer diagnosed in January 2015 status post left lower lobectomy. EXAM:  CT CHEST WITHOUT CONTRAST TECHNIQUE: Multidetector CT imaging of the chest was performed following the standard protocol without IV contrast. COMPARISON:  Chest CT 08/28/2015, 02/10/2015 and 11/05/2013. FINDINGS: Mediastinum/Nodes: There are no enlarged mediastinal, hilar or axillary lymph nodes.Small mediastinal lymph nodes are unchanged. Hilar assessment is limited by the lack of intravenous contrast, although the hilar contours appear unchanged. There is a stable small hiatal hernia. Small tracheal polyps appear unchanged. The heart size is normal. There is no pericardial effusion. Mitral annular calcifications and atherosclerosis of the aorta, great vessels and coronary arteries noted. Lungs/Pleura: There is no pleural effusion. Status post left lower lobe resection. The left lung has a  stable appearance without suspicious nodules. There are several stable small pulmonary nodules in the right lung, primarily subpleural in location. Some of these are more distinct on the current examination due to the use of the smaller slice thickness. Previously referenced nodules include a 4 mm nodule along the superior aspect of the major fissure on image 56 which is unchanged. In addition, there are focal ground-glass opacities measure 11 mm in the right lower lobe on image 75 of series 6 and in the right middle lobe measuring 9 mm on image 76. These appear stable from the baseline examination of 2015 and demonstrate no solid components. Upper abdomen: The visualized upper abdomen has a stable appearance without suspicious findings. There is a stable 9 mm cyst in the right hepatic lobe on image 103 and stable left renal cysts. These have been evaluated by recent MRI. Musculoskeletal/Chest wall: There is no chest wall mass or suspicious osseous finding. IMPRESSION: 1. Stable chest CT status post left lower lobe resection. No evidence of local recurrence or metastatic disease. 2. Stable pulmonary nodularity consistent with a benign etiology based on stability. There are stable ground-glass nodules in the right lung from baseline examination of 2015. Follow up by CT is recommended in 12 months, with continued annual surveillance for a minimum of 3 years. These recommendations are taken from: Recommendations for the Management of Subsolid Pulmonary Nodules Detected at CT: A Statement from the [employer] 2013; 266:1, 304-317. 3. Stable additional incidental findings. Electronically Signed   By: Richardean Sale M.D.   On: 02/21/2016 08:59   I have independently reviewed the above radiology studies  and reviewed the findings with the patient.  Ct Chest Wo Contrast  08/28/2015  CLINICAL DATA:  Left lower lobe lung Lung cancer diagnosed in January 2015. Left lower lobectomy. EXAM: CT CHEST  WITHOUT CONTRAST TECHNIQUE: Multidetector CT imaging of the chest was performed following the standard protocol without IV contrast. COMPARISON:  02/20/2015 FINDINGS: Mediastinum/Nodes: Aortic and branch vessel atherosclerotic calcification. Mild calcification of the mitral valve. Scattered small mediastinal lymph nodes are not pathologically enlarged by size criteria. Borderline cardiomegaly. Small type 1 hiatal hernia. Lungs/Pleura: Stable 4 mm right upper lobe nodule, image 22 series 5, not appreciably changed from 11/05/2013. Stable indistinct 3 mm right lower lobe pulmonary nodule on image 39 series 5, no change from 11/05/2013. Left lower lobectomy. Stable non dependent small foci of tracheobronchial wall thickening favoring polyps. Upper abdomen: Cholecystectomy. On the bottom most image (image 65, series 2), the medial parenchymal lip of the left mid kidney appears to have a more medially convex contour than it it has in the past. This makes it difficult to exclude a small mass. Musculoskeletal: Lower cervical plate and screw fixator. IMPRESSION: 1. Although I do not see evidence of recurrence in the chest, there is  some apparent increased in prominence of the left mid kidney parenchyma medially which could possibly reflect an underlying small mass. Consider dedicated renal imaging such as renal protocol CT with and without contrast or renal protocol MRI with and without contrast for definitive characterization. 2. Several tiny nodules in the right lung are unchanged compared to earliest cross-sectional imaging of 11/05/2013. These are likely benign but merit observation. 3. Atherosclerosis. 4. Borderline cardiomegaly. 5. Small type 1 hiatal hernia. 6. Stable small tracheal polyps. Electronically Signed   By: Van Clines M.D.   On: 08/28/2015 10:54    Ct Chest Wo Contrast  02/20/2015   CLINICAL DATA:  Left lung cancer restaging.  EXAM: CT CHEST WITHOUT CONTRAST  TECHNIQUE: Multidetector CT imaging  of the chest was performed following the standard protocol without IV contrast.  COMPARISON:  08/19/2014  FINDINGS: Mediastinum: The heart size is normal. There is no pericardial effusion. Calcified atherosclerotic plaque involves the thoracic aorta. The trachea appears patent and is midline. Normal appearance of the esophagus. No enlarged mediastinal or hilar lymph nodes identified.  Lungs/Pleura: There is no pleural effusion identified. There is no airspace consolidation identified. Right upper lobe pulmonary nodule measures 4 mm, image 20/series 5. This is stable from previous exam. Right apical nodule is stable measuring 3 mm, image 11/series 5. Postoperative change in volume loss involving the left hemi thorax identified compatible with prior left lower lobe lobectomy. Faint nodule in the left midlung is unchanged measuring 2-3 mm, image 29/series 5.  Upper Abdomen: The adrenal glands are both normal. The visualized portions of the liver and spleen are unremarkable. Normal appearance of the pancreas.  Musculoskeletal: Review of the visualized osseous structures is negative for aggressive lytic or sclerotic bone lesion.  IMPRESSION: 1. No acute findings. No specific features identified to suggest recurrence of tumor status post left lower lobectomy. 2. Stable small nonspecific nodules.   Electronically Signed   By: Kerby Moors M.D.   On: 02/20/2015 09:34   Dg Tibia/fibula Right  08/25/2014   CLINICAL DATA:  Foot pain and swelling starting this morning. Bilateral ankle pain and swelling. Difficulty standing. Diabetes.  EXAM: RIGHT TIBIA AND FIBULA - 2 VIEW  COMPARISON:  None.  FINDINGS: Medial compartmental loss of articular space and marginal spurring in the knee. Mild patellar spurring.  No acute bony abnormality of the tibia or fibula noted. Plantar calcaneal spur.  IMPRESSION: 1. Osteoarthritis of the knee.  No acute bony findings.   Electronically Signed   By: Sherryl Barters M.D.   On: 08/25/2014  15:05   Ct Chest Wo Contrast  08/19/2014   CLINICAL DATA:  Left lower lobe lung cancer diagnosed 1/15. Surgery only. Restaging.Lung cancer, main bronchus, left C34.02 (ICD-10-CM) Malignant neoplasm of lower lobe of left lung C34.32 (ICD-10-CM)  EXAM: CT CHEST WITHOUT CONTRAST  TECHNIQUE: Multidetector CT imaging of the chest was performed following the standard protocol without IV contrast.  COMPARISON:  06/13/2014  FINDINGS: Lungs/Pleura: Nodularity along the non dependent trachea, including on images 7 through 11. Similar in configuration to on the prior exam, and back to 11/05/2013  Surgical changes of left lower lobectomy.  Mild centrilobular emphysema. 2 mm right apical lung nodule on image 9 is unchanged.  Central superior segment right lower lobe ground-glass opacity on image 28 of series 5. This measures 2.7 cm and is favored to represent an area of volume loss/ atelectasis. This is new.  Subpleural posterior right upper lobe 2 mm nodule is unchanged on  image 18.  Left-sided pleural thickening is similar with resolution of trace fluid.  Heart/Mediastinum: No supraclavicular adenopathy. Aortic and branch vessel atherosclerosis. Mild cardiomegaly. No pericardial effusion. No mediastinal or definite hilar adenopathy, given limitations of unenhanced CT. Small hiatal hernia. Left hemidiaphragm elevation.  Upper Abdomen: Cholecystectomy. Normal imaged portions of the liver, spleen, pancreas, adrenal glands. Cholecystectomy. Too small to characterize upper pole left renal lesion, most likely a cyst. This measures approximately 8 mm.  Bones/Musculoskeletal:  No acute osseous abnormality.  IMPRESSION: 1. Status post left lower lobectomy, without evidence of recurrent or metastatic disease. 2. Persistent left pleural thickening. 3. Similar indeterminate pulmonary nodules. 4. Nodularity along the non dependent trachea. Given presence over prior exams, suspicious for tracheal polyps. Recommend attention on  follow-up. 5. A new area of ground-glass opacity within the central right lower lobe. Favored to represent subsegmental atelectasis. Recommend attention on follow-up.   Electronically Signed   By: Abigail Miyamoto M.D.   On: 08/19/2014 13:18   Ct Chest Wo Contrast  02/17/2014   CLINICAL DATA:  Lung infiltrates. History of lung carcinoma in January 2015 with a left lower lobectomy. Short of breath.  EXAM: CT CHEST WITHOUT CONTRAST  TECHNIQUE: Multidetector CT imaging of the chest was performed following the standard protocol without IV contrast.  COMPARISON:  Current chest radiograph.  Chest CT, 11/05/2013  FINDINGS: There are small right and minimal left pleural effusions. There is consolidation in coarse reticular opacity in the remaining left upper lobe in the posterior lung base. This may all be atelectasis. Pneumonia should be considered likely in the proper clinical setting.  There are 2 small stable nodular densities in the right lung, most likely benign granulomas, scarring or a combination. Mild stable scarring is noted in the anterior medial right lung base. This subsegmental atelectasis at the posterior right lung base. No pulmonary edema. No discrete left lung nodules are seen. There is volume loss on the left from the left lower lobectomy.  There is new mild mediastinal adenopathy. A 1 cm short axis node lies in the precarinal region where it had measures 6.5 mm. There are several prevascular nodes that are prominent. There is a 7 mm node just superior to the main pulmonary artery. This measured 3 mm previously. There is a 9.4 mm right supraclavicular node which previously measured 1 mm smaller. There is and 6 mm left supraclavicular another which measured 4 mm previously.  Heart is normal in size and configuration.  Limited evaluation of the upper abdomen shows node with a or adrenal masses.  The bony thorax is demineralized. There are degenerative changes along the visualized spine. No osteoblastic or  osteolytic lesions.  ri IMPRESSION: 1. There is focal opacity with associated linear and coarse reticular opacity in the posterior lower aspect of the remaining left lung. Although this could be atelectasis, pneumonia is suspected. There is a minimal associated left pleural effusion. 2. There are prominent mediastinal lymph nodes which have increased from the prior study. This could reflect metastatic disease. The nodes may be reactive to the lower lung zone pneumonia. There are prominent supraclavicular nodes that have increased in size as well. No other evidence suggesting metastatic disease. 3. Smallght effusion.  No evidence of a right lung infiltrate. 4. To small right lung nodules are without significant change in most likely benign.   Electronically Signed   By: Lajean Manes M.D.    Recent Labs: Lab Results  Component Value Date   WBC 8.4 02/21/2016  HGB 12.5 02/21/2016   HCT 40.0 02/21/2016   PLT 272 02/21/2016   GLUCOSE 94 02/21/2016   CHOL 184 09/07/2015   TRIG 180.0* 09/07/2015   HDL 46.80 09/07/2015   LDLDIRECT 126.3 09/10/2012   LDLCALC 101* 09/07/2015   ALT <9 02/21/2016   AST 12 02/21/2016   NA 145 02/21/2016   K 3.9 02/21/2016   CL 101 09/07/2015   CREATININE 1.4* 02/21/2016   BUN 41.5* 02/21/2016   CO2 29 02/21/2016   TSH 0.61 06/28/2015   INR 1.01 02/25/2014   HGBA1C 5.2 01/05/2016    Assessment / Plan:   Lung cancer, left lower lobe     Pathologic: Stage IA (T1a, N0, cM0) - January of 2015  S/p  Bronchoscopy, left video-assisted thoracoscopy, with wedge resection for biopsy of left lower lobe lesion, completion left lower lobectomy, lymph node dissection  CURRENT THERAPY: Observation. Ct scan one year ordered by Dr. Inda Merlin,  I have not made the patient a follow-up point to see me if she has a scan scheduled already in one year, she or Dr. Julien Nordmann will get in contact if any further abnormalities are noted on follow-up CT scan  renal bx proven  IGA  GLOMERULONEPHRITIS WITH FOCAL MILD ACTIVITY- improving on decreased dose of steroids     Grace Isaac MD      [address] [place] [address] Office [phone]   Beeper [phone]

## 2016-03-16 ENCOUNTER — Other Ambulatory Visit: Payer: Self-pay | Admitting: Family Medicine

## 2016-03-18 ENCOUNTER — Ambulatory Visit (INDEPENDENT_AMBULATORY_CARE_PROVIDER_SITE_OTHER): Payer: Medicare Other | Admitting: Family Medicine

## 2016-03-18 ENCOUNTER — Encounter: Payer: Self-pay | Admitting: Family Medicine

## 2016-03-18 VITALS — BP 136/86 | HR 72 | Temp 98.1°F | Resp 16 | Ht 65.0 in | Wt 231.2 lb

## 2016-03-18 DIAGNOSIS — E099 Drug or chemical induced diabetes mellitus without complications: Secondary | ICD-10-CM | POA: Diagnosis not present

## 2016-03-18 DIAGNOSIS — E038 Other specified hypothyroidism: Secondary | ICD-10-CM | POA: Diagnosis not present

## 2016-03-18 DIAGNOSIS — I10 Essential (primary) hypertension: Secondary | ICD-10-CM | POA: Diagnosis not present

## 2016-03-18 DIAGNOSIS — Z23 Encounter for immunization: Secondary | ICD-10-CM

## 2016-03-18 DIAGNOSIS — M1A3790 Chronic gout due to renal impairment, unspecified ankle and foot, without tophus (tophi): Secondary | ICD-10-CM | POA: Diagnosis not present

## 2016-03-18 DIAGNOSIS — E785 Hyperlipidemia, unspecified: Secondary | ICD-10-CM

## 2016-03-18 DIAGNOSIS — Z0000 Encounter for general adult medical examination without abnormal findings: Secondary | ICD-10-CM | POA: Diagnosis not present

## 2016-03-18 DIAGNOSIS — T380X5A Adverse effect of glucocorticoids and synthetic analogues, initial encounter: Secondary | ICD-10-CM

## 2016-03-18 LAB — CBC WITH DIFFERENTIAL/PLATELET
BASOS PCT: 0.4 % (ref 0.0–3.0)
Basophils Absolute: 0 10*3/uL (ref 0.0–0.1)
EOS ABS: 0.2 10*3/uL (ref 0.0–0.7)
EOS PCT: 2.1 % (ref 0.0–5.0)
HEMATOCRIT: 37.3 % (ref 36.0–46.0)
HEMOGLOBIN: 11.9 g/dL — AB (ref 12.0–15.0)
LYMPHS PCT: 12.2 % (ref 12.0–46.0)
Lymphs Abs: 1.3 10*3/uL (ref 0.7–4.0)
MCHC: 31.9 g/dL (ref 30.0–36.0)
MCV: 85.7 fl (ref 78.0–100.0)
MONOS PCT: 4.9 % (ref 3.0–12.0)
Monocytes Absolute: 0.5 10*3/uL (ref 0.1–1.0)
NEUTROS ABS: 8.3 10*3/uL — AB (ref 1.4–7.7)
Neutrophils Relative %: 80.4 % — ABNORMAL HIGH (ref 43.0–77.0)
PLATELETS: 296 10*3/uL (ref 150.0–400.0)
RBC: 4.36 Mil/uL (ref 3.87–5.11)
RDW: 18.9 % — AB (ref 11.5–15.5)
WBC: 10.3 10*3/uL (ref 4.0–10.5)

## 2016-03-18 LAB — HEPATIC FUNCTION PANEL
ALT: 9 U/L (ref 0–35)
AST: 14 U/L (ref 0–37)
Albumin: 4.1 g/dL (ref 3.5–5.2)
Alkaline Phosphatase: 100 U/L (ref 39–117)
BILIRUBIN DIRECT: 0.1 mg/dL (ref 0.0–0.3)
BILIRUBIN TOTAL: 0.5 mg/dL (ref 0.2–1.2)
Total Protein: 6.9 g/dL (ref 6.0–8.3)

## 2016-03-18 LAB — LIPID PANEL
CHOL/HDL RATIO: 5
CHOLESTEROL: 218 mg/dL — AB (ref 0–200)
HDL: 47.4 mg/dL (ref >39.00)
LDL CALC: 148 mg/dL — AB (ref 0–99)
NONHDL: 170.11
Triglycerides: 113 mg/dL (ref 0.0–149.0)
VLDL: 22.6 mg/dL (ref 0.0–40.0)

## 2016-03-18 LAB — BASIC METABOLIC PANEL
BUN: 34 mg/dL — ABNORMAL HIGH (ref 6–23)
CALCIUM: 9.5 mg/dL (ref 8.4–10.5)
CO2: 33 meq/L — AB (ref 19–32)
CREATININE: 1.35 mg/dL — AB (ref 0.40–1.20)
Chloride: 103 mEq/L (ref 96–112)
GFR: 40.38 mL/min — AB (ref >60.00)
Glucose, Bld: 85 mg/dL (ref 70–99)
POTASSIUM: 3.5 meq/L (ref 3.5–5.1)
SODIUM: 145 meq/L (ref 135–145)

## 2016-03-18 LAB — URIC ACID: Uric Acid, Serum: 5 mg/dL (ref 2.4–7.0)

## 2016-03-18 LAB — TSH: TSH: 3.68 u[IU]/mL (ref 0.35–4.50)

## 2016-03-18 NOTE — Progress Notes (Signed)
Pre visit review using our clinic review tool, if applicable. No additional management support is needed unless otherwise documented below in the visit note. 

## 2016-03-18 NOTE — Progress Notes (Signed)
   Subjective:    Patient ID: Susan Pittman, female    DOB: 04-09-1941, 75 y.o.   MRN: [id number]  HPI Here today for CPE.  Risk Factors: HTN- chronic problem, on Bystolic, Lasix, Hydralazine w/ adequate control.  Pt reports BP has been mildly elevated recently but she feels this is b/c she hasn't been able to go to the gym due to a foot injury.  Denies CP.  + SOB w/ exertion.  Denies HAs, visual changes, edema Hyperlipidemia- chronic problem, currently attempting to control w/ diet and exercise.  Denies abd pain, N/V, myalgias Hypothyroid- chronic problem, on Levothyroxine.  Denies fatigue, constipation DM- chronic problem, steroid induced.  Off all medication.  Following w/ Dr Loanne Drilling who released her as of March 2017 when last A1C was 5.2 Physical Activity: exercising at the gym as able Fall Risk: elevated- walks w/ cane Depression: chronic problem, doing better since starting Zoloft Hearing: mildly decreased to conversational tones, decreased to whispered voice ADL's: independent Cognitive: normal linear thought process, memory and attention intact Home Safety: safe at home, lives w/ husband Height, Weight, BMI, Visual Acuity: see vitals, vision corrected to 20/20 w/ glasses Counseling: due for mammo (pt plans to schedule) and Pneumovax.  No longer having colonoscopy.  No need for pap Care team reviewed and updated Labs Ordered: See A&P Care Plan: See A&P    Review of Systems Patient reports no vision/ hearing changes, adenopathy,fever, weight change,  persistant/recurrent hoarseness , swallowing issues, chest pain, palpitations, edema, persistant/recurrent cough, hemoptysis, gastrointestinal bleeding (melena, rectal bleeding), abdominal pain, significant heartburn, bowel changes, GU symptoms (dysuria, hematuria, incontinence), Gyn symptoms (abnormal  bleeding, pain),  syncope, focal weakness, memory loss, numbness & tingling, skin/hair/nail changes, abnormal bruising or bleeding,  anxiety, or depression.   + SOB w/ exertion    Objective:   Physical Exam General Appearance:    Alert, cooperative, no distress, appears stated age, obese  Head:    Normocephalic, without obvious abnormality, atraumatic  Eyes:    PERRL, conjunctiva/corneas clear, EOM's intact, fundi    benign, both eyes  Ears:    Normal TM's and external ear canals, both ears  Nose:   Nares normal, septum midline, mucosa normal, no drainage    or sinus tenderness  Throat:   Lips, mucosa, and tongue normal; teeth and gums normal  Neck:   Supple, symmetrical, trachea midline, no adenopathy;    Thyroid: no enlargement/tenderness/nodules  Back:     Symmetric, no curvature, ROM normal, no CVA tenderness  Lungs:     Clear to auscultation bilaterally, respirations unlabored  Chest Wall:    No tenderness or deformity   Heart:    Regular rate and rhythm, S1 and S2 normal, no murmur, rub   or gallop  Breast Exam:    Deferred to mammo  Abdomen:     Soft, non-tender, bowel sounds active all four quadrants,    no masses, no organomegaly  Genitalia:    Deferred  Rectal:    Extremities:   Extremities normal, atraumatic, no cyanosis or edema  Pulses:   2+ and symmetric all extremities  Skin:   Skin color, texture, turgor normal, no rashes or lesions  Lymph nodes:   Cervical, supraclavicular, and axillary nodes normal  Neurologic:   CNII-XII intact, normal strength, sensation and reflexes    throughout          Assessment & Plan:

## 2016-03-18 NOTE — Assessment & Plan Note (Signed)
Ongoing issue but this seems to be resolved.  Pt's last A1C was 5.2 in March 2017 and she was released by Endo.  Will continue to follow.

## 2016-03-18 NOTE — Telephone Encounter (Signed)
Medication filled to pharmacy as requested.   

## 2016-03-18 NOTE — Patient Instructions (Signed)
Follow up in 6 months to recheck thyroid, blood pressure, and cholesterol We'll notify you of your lab results and make any changes if needed Continue to work on healthy diet and regular exercise- you look great! Please call and schedule your mammogram You no longer need colonoscopy- yay! You are up to date on bone density until next year You are up to date on immunizations w/ today's Pneumovax Call with any questions or concerns Thanks for sticking with [place]! Have a great summer!

## 2016-03-19 NOTE — Assessment & Plan Note (Signed)
Chronic problem, currently asymptomatic.  Check uric acid today.

## 2016-03-19 NOTE — Assessment & Plan Note (Signed)
Pt's PE unchanged from previous.  No longer having colonoscopy, due for mammo- pt plans to schedule.  Due for pneumovax.  Written screening schedule updated and given to pt. Check labs.  Anticipatory guidance provided.

## 2016-03-19 NOTE — Assessment & Plan Note (Signed)
Chronic problem.  Currently asymptomatic.  Check labs.  Adjust meds prn  

## 2016-03-19 NOTE — Assessment & Plan Note (Signed)
Chronic problem.  Attempting to control w/ healthy diet and regular exercise.  Check labs.  Start meds prn.

## 2016-03-19 NOTE — Assessment & Plan Note (Signed)
Chronic problem.  BP improved on repeat check.  Asymptomatic.  Check labs.  No anticipated med changes.  Will follow.

## 2016-03-20 ENCOUNTER — Other Ambulatory Visit: Payer: Self-pay | Admitting: Cardiovascular Disease

## 2016-03-20 DIAGNOSIS — I701 Atherosclerosis of renal artery: Secondary | ICD-10-CM

## 2016-03-25 DIAGNOSIS — Z1231 Encounter for screening mammogram for malignant neoplasm of breast: Secondary | ICD-10-CM | POA: Diagnosis not present

## 2016-03-25 LAB — HM MAMMOGRAPHY

## 2016-03-29 ENCOUNTER — Other Ambulatory Visit: Payer: Self-pay | Admitting: Family Medicine

## 2016-04-08 ENCOUNTER — Other Ambulatory Visit: Payer: Self-pay | Admitting: Family Medicine

## 2016-04-08 NOTE — Telephone Encounter (Signed)
Medication filled to pharmacy as requested.   

## 2016-04-09 ENCOUNTER — Ambulatory Visit (HOSPITAL_COMMUNITY)
Admission: RE | Admit: 2016-04-09 | Discharge: 2016-04-09 | Disposition: A | Discharge status: Home / Self Care | Payer: Medicare Other | Source: Ambulatory Visit | Attending: Cardiovascular Disease | Admitting: Cardiovascular Disease

## 2016-04-09 ENCOUNTER — Encounter: Payer: Self-pay | Admitting: Cardiovascular Disease

## 2016-04-09 ENCOUNTER — Ambulatory Visit (INDEPENDENT_AMBULATORY_CARE_PROVIDER_SITE_OTHER): Payer: Medicare Other | Admitting: Cardiovascular Disease

## 2016-04-09 VITALS — BP 134/59 | HR 66 | Ht 65.0 in | Wt 233.4 lb

## 2016-04-09 DIAGNOSIS — I130 Hypertensive heart and chronic kidney disease with heart failure and stage 1 through stage 4 chronic kidney disease, or unspecified chronic kidney disease: Secondary | ICD-10-CM | POA: Insufficient documentation

## 2016-04-09 DIAGNOSIS — I2510 Atherosclerotic heart disease of native coronary artery without angina pectoris: Secondary | ICD-10-CM | POA: Diagnosis not present

## 2016-04-09 DIAGNOSIS — I10 Essential (primary) hypertension: Secondary | ICD-10-CM

## 2016-04-09 DIAGNOSIS — I701 Atherosclerosis of renal artery: Secondary | ICD-10-CM | POA: Diagnosis not present

## 2016-04-09 DIAGNOSIS — N183 Chronic kidney disease, stage 3 (moderate): Secondary | ICD-10-CM | POA: Diagnosis not present

## 2016-04-09 DIAGNOSIS — K219 Gastro-esophageal reflux disease without esophagitis: Secondary | ICD-10-CM | POA: Diagnosis not present

## 2016-04-09 DIAGNOSIS — I5030 Unspecified diastolic (congestive) heart failure: Secondary | ICD-10-CM | POA: Diagnosis not present

## 2016-04-09 DIAGNOSIS — E785 Hyperlipidemia, unspecified: Secondary | ICD-10-CM | POA: Diagnosis not present

## 2016-04-09 NOTE — Patient Instructions (Signed)
Your physician wants you to follow-up in: 1 Year. You will receive a reminder letter in the mail two months in advance. If you don't receive a letter, please call our office to schedule the follow-up appointment.  

## 2016-04-10 NOTE — Progress Notes (Signed)
Cardiology Office Note   Date:  04/10/2016   ID:  Susan Pittman, DOB [birth year], MRN [id number]  PCP:  [name], MD  Cardiologist:   Kathlyn Sacramento, MD   Chief Complaint  Patient presents with  . Annual Exam    patient reports no problems since last appointment.      History of Present Illness: Susan Pittman is a 75 y.o. female who presents for a follow up visit regarding renal artery stenting.  She has a long-standing history of hypertension for at least 30-40 years. She initially was treated with diuretic therapy.  She has a history of hypothyroidism, remote peptic ulcer disease in the 1980s, as well as GERD.  As part of her workup for hypertension, she was found to have significant renal artery stenosis.  On 11/08/2013 she underwent stenting of her right renal artery by Dr. Gwenlyn Found after a renal Doppler suggested hemodynamically significant right renal artery stenosis.  She subsequently presented to the hospital with chest pain and on 12/12/2013 underwent cardiac catheterization .  She was found to have mild nonobstructive CAD with smooth 20% RCA narrowing.  Ejection fraction was 65%.   She was found to have lung nodules and on 12/15/2013 underwent VATS and mini thoracotomy with lobectomy of the left lower lobe with lymph node dissection for adenocarcinoma.  She has known history of IgA nephropathy which was treated with prednisone. It was tapered off early this year. Overall, she has been doing well with no chest pain, shortness of breath or significant leg edema. She underwent renal artery duplex today which showed patent right renal artery stent and no significant disease affecting the left renal artery. Kidney size was normal.   Past Medical History  Diagnosis Date  . Asthma   . Hypertension   . GERD (gastroesophageal reflux disease)   . Allergy   . Hypothyroid   . Osteoporosis   . Gastric ulcer   . Hiatal hernia   . Hyperlipidemia     "don't take RX for it"  (11/08/2013)  . Renal artery stenosis ([place])     with stent placement  . Lung mass     superior segment LLL/notes 11/08/2013  . Pneumonia     "I've had it ?3 times" (11/08/2013)  . Arthritis     "knees, hips, back, hands" (11/08/2013)  . DJD (degenerative joint disease)   . Lung nodule/ adenoca > LLobectomy 12/15/13  11/08/2013    PET 11/23/2013 1. Dominant sub solid left lower lobe nodule does not demonstrate significantly increased metabolic activity. However, morphologically, adenocarcinoma is still a concern.   - Spirometry 11/24/13 wnl  - 11/24/2013 T surg eval rec> LLower lobectomy 12/15/2013 for adenoca   . CAD (coronary artery disease), non obstructive by cardiac cath 12/12/13 01/24/2014  . Diastolic CHF ([place])   . CKD (chronic kidney disease), stage III   . Nephropathy   . Basal cell carcinoma     "cut them off face & right shoulder" (11/08/2013)    Past Surgical History  Procedure Laterality Date  . Knee arthroscopy Bilateral   . Anterior cervical decomp/discectomy fusion  2000    C5-C6  . Renal artery stent Right 11/08/2013    Archie Endo 11/08/2013  . Tonsillectomy and adenoidectomy  ?1946  . Cholecystectomy  1970's  . Vaginal hysterectomy  1970  . Dilation and curettage of uterus  1960's  . Skin cancer excision    . Coronary angiogram      Hx: of 2/  2015  . Cardiac catheterization      Hx: of 2/ 2015  . Video bronchoscopy N/A 12/15/2013    Procedure: VIDEO BRONCHOSCOPY;  Surgeon: Grace Isaac, MD;  Location[facility] OR;  Service: Thoracic;  Laterality: N/A;  . Video assisted thoracoscopy (vats)/wedge resection Left 12/15/2013    Procedure: VIDEO ASSISTED THORACOSCOPY (VATS)/WEDGE RESECTION;  Surgeon: Grace Isaac, MD;  Location: [place];  Service: Thoracic;  Laterality: Left;  . Lobectomy Left 12/15/2013    Procedure: LOBECTOMY;  Surgeon: Grace Isaac, MD;  Location: [place];  Service: Thoracic;  Laterality: Left;  . Renal angiogram Bilateral 11/08/2013    Procedure: RENAL ANGIOGRAM;   Surgeon: Lorretta Harp, MD;  Location: [facility] CATH LAB;  Service: Cardiovascular;  Laterality: Bilateral;  . Percutaneous stent intervention Right 11/08/2013    Procedure: PERCUTANEOUS STENT INTERVENTION;  Surgeon: Lorretta Harp, MD;  Location: [facility] CATH LAB;  Service: Cardiovascular;  Laterality: Right;  rt renal artery stent  . Left heart catheterization with coronary angiogram N/A 12/13/2013    Procedure: LEFT HEART CATHETERIZATION WITH CORONARY ANGIOGRAM;  Surgeon: Burnell Blanks, MD;  Location: [facility] CATH LAB;  Service: Cardiovascular;  Laterality: N/A;     Current Outpatient Prescriptions  Medication Sig Dispense Refill  . albuterol (PROVENTIL HFA;VENTOLIN HFA) 108 (90 BASE) MCG/ACT inhaler Inhale 1-2 puffs into the lungs every 6 (six) hours as needed for wheezing or shortness of breath. 18 g 1  . allopurinol (ZYLOPRIM) 100 MG tablet Take 100 mg by mouth 2 (two) times daily. Takes 2 tablets in am    . BYSTOLIC 20 MG TABS TAKE ONE TABLET BY MOUTH ONCE DAILY 30 tablet 6  . Calcium Carb-Cholecalciferol 600-800 MG-UNIT TABS Take 2 tablets by mouth daily.     . cetirizine (ZYRTEC) 10 MG tablet Take 10 mg by mouth at bedtime.     . fluticasone (FLONASE) 50 MCG/ACT nasal spray USE TWO SPRAY(S) IN EACH NOSTRIL ONCE DAILY 16 g 2  . furosemide (LASIX) 40 MG tablet TAKE ONE TABLET BY MOUTH IN THE EVENING UNTIL  WEIGHT  OF  219  IS  REACHED 90 tablet 3  . furosemide (LASIX) 80 MG tablet TAKE ONE TABLET BY MOUTH IN THE MORNING 90 tablet 0  . glucose blood (ONE TOUCH ULTRA TEST) test strip Dx E09.9. Please use one strip each time sugars are checked. Pt tests 2 times daily. 100 each 6  . hydrALAZINE (APRESOLINE) 50 MG tablet TAKE ONE TABLET BY MOUTH TWICE DAILY 60 tablet 6  . Lancets (ONETOUCH ULTRASOFT) lancets Use 1 per day dx code 249.00 100 each 12  . lansoprazole (PREVACID) 15 MG capsule Take 15 mg by mouth daily at 12 noon.    Marland Kitchen levothyroxine (SYNTHROID, LEVOTHROID) 88 MCG tablet TAKE ONE TABLET  BY MOUTH ONCE DAILY BEFORE  BREAKFAST 90 tablet 0  . ONETOUCH DELICA LANCETS 61W MISC Use to check blood sugar once daily as instructed. Dx code: E11.9 100 each 2  . sertraline (ZOLOFT) 50 MG tablet TAKE ONE & ONE-HALF TABLETS BY MOUTH ONCE DAILY 45 tablet 6   No current facility-administered medications for this visit.    Allergies:   Benazepril hcl; Loratadine; Allegra; Celecoxib; Lisinopril; and Sulfa antibiotics    Social History:  The patient  reports that she has never smoked. She has never used smokeless tobacco. She reports that she does not drink alcohol or use illicit drugs.   Family History:  The patient's family history includes Allergies in her  brother and sister; Breast cancer in her sister; Diabetes in her brother and father; Emphysema in her father; Heart disease in her father and mother; Stroke in her father. There is no history of Heart failure.    ROS:  Please see the history of present illness.   Otherwise, review of systems are positive for none.   All other systems are reviewed and negative.    PHYSICAL EXAM: VS:  BP 134/59 mmHg  Pulse 66  Ht '5\' 5"'$  (1.651 m)  Wt 233 lb 6.4 oz (105.87 kg)  BMI 38.84 kg/m2 , BMI Body mass index is 38.84 kg/(m^2). GEN: Well nourished, well developed, in no acute distress HEENT: normal Neck: no JVD, carotid bruits, or masses Cardiac: RRR; no rubs, or gallops,no edema . It is 1/6 systolic murmur in the aortic area Respiratory:  clear to auscultation bilaterally, normal work of breathing GI: soft, nontender, nondistended, + BS MS: no deformity or atrophy Skin: warm and dry, no rash Neuro:  Strength and sensation are intact Psych: euthymic mood, full affect   EKG:  EKG is ordered today. The ekg ordered today demonstrates normal sinus rhythm with no significant ST or T wave changes   Recent Labs: 03/18/2016: ALT 9; BUN 34*; Creatinine, Ser 1.35*; Hemoglobin 11.9*; Platelets 296.0; Potassium 3.5; Sodium 145; TSH 3.68    Lipid  Panel    Component Value Date/Time   CHOL 218* 03/18/2016 0835   TRIG 113.0 03/18/2016 0835   HDL 47.40 03/18/2016 0835   CHOLHDL 5 03/18/2016 0835   VLDL 22.6 03/18/2016 0835   LDLCALC 148* 03/18/2016 0835   LDLDIRECT 126.3 09/10/2012 1537      Wt Readings from Last 3 Encounters:  04/09/16 233 lb 6.4 oz (105.87 kg)  03/18/16 231 lb 4 oz (104.894 kg)  03/14/16 227 lb (102.967 kg)        ASSESSMENT AND PLAN:  1.  Status post right renal artery stenting: Duplex showed patent stent with no significant disease. She can follow-up with me in a yearly basis. Blood pressure is controlled and creatinine has been stable most recently 1.35.  2. Essential hypertension: Blood pressure is controlled on current medications.   Disposition:   FU with me in 1 year  Signed,  Kathlyn Sacramento, MD  04/10/2016 11:18 AM    [place]

## 2016-04-17 ENCOUNTER — Encounter: Payer: Self-pay | Admitting: Family Medicine

## 2016-04-17 ENCOUNTER — Ambulatory Visit (INDEPENDENT_AMBULATORY_CARE_PROVIDER_SITE_OTHER): Payer: Medicare Other | Admitting: Family Medicine

## 2016-04-17 VITALS — BP 142/86 | HR 8 | Temp 98.1°F | Resp 16 | Ht 65.0 in | Wt 233.4 lb

## 2016-04-17 DIAGNOSIS — R05 Cough: Secondary | ICD-10-CM | POA: Diagnosis not present

## 2016-04-17 DIAGNOSIS — I701 Atherosclerosis of renal artery: Secondary | ICD-10-CM | POA: Diagnosis not present

## 2016-04-17 DIAGNOSIS — E119 Type 2 diabetes mellitus without complications: Secondary | ICD-10-CM | POA: Diagnosis not present

## 2016-04-17 DIAGNOSIS — R059 Cough, unspecified: Secondary | ICD-10-CM

## 2016-04-17 DIAGNOSIS — H04123 Dry eye syndrome of bilateral lacrimal glands: Secondary | ICD-10-CM | POA: Diagnosis not present

## 2016-04-17 MED ORDER — BENZONATATE 200 MG PO CAPS: 200.0000 mg | ORAL_CAPSULE | Freq: Two times a day (BID) | ORAL | Status: DC | PRN

## 2016-04-17 NOTE — Progress Notes (Signed)
Pre visit review using our clinic review tool, if applicable. No additional management support is needed unless otherwise documented below in the visit note. 

## 2016-04-17 NOTE — Progress Notes (Signed)
   Subjective:    Patient ID: Susan Pittman, female    DOB: [birth year], 75 y.o.   MRN: [id number]  HPI Cough- sxs started Saturday.  No fevers.  Cough is intermittently productive- but mostly dry.  + sick contacts.  2 nights ago had sinus pressure.  No HA.  + sore throat.  Bilateral ear fullness w/ popping, some discomfort.  No N/V.  No SOB.  Using flonase and zyrtec daily.  Using Albuterol inhaler periodically for wheezing.   Review of Systems For ROS see HPI     Objective:   Physical Exam  Constitutional: She is oriented to person, place, and time. She appears well-developed and well-nourished. No distress.  HENT:  Head: Normocephalic and atraumatic.  Right Ear: Tympanic membrane normal.  Left Ear: Tympanic membrane normal.  Nose: Mucosal edema and rhinorrhea present. Right sinus exhibits no maxillary sinus tenderness and no frontal sinus tenderness. Left sinus exhibits no maxillary sinus tenderness and no frontal sinus tenderness.  Mouth/Throat: Mucous membranes are normal. Posterior oropharyngeal erythema (w/ PND) present.  Eyes: Conjunctivae and EOM are normal. Pupils are equal, round, and reactive to light.  Neck: Normal range of motion. Neck supple.  Cardiovascular: Normal rate, regular rhythm and normal heart sounds.   Pulmonary/Chest: Effort normal and breath sounds normal. No respiratory distress. She has no wheezes. She has no rales.  Dry cough  Lymphadenopathy:    She has no cervical adenopathy.  Neurological: She is alert and oriented to person, place, and time.  Skin: Skin is warm and dry.  Psychiatric: She has a normal mood and affect. Her behavior is normal.  Vitals reviewed.         Assessment & Plan:  Cough- no evidence of bacterial infection.  Suspect this is a viral/allergy combo.  Reviewed supportive care- allergy meds daily and cough meds prn.  Reviewed supportive care and red flags that should prompt return.  Pt expressed understanding and is in agreement  w/ plan.

## 2016-04-17 NOTE — Patient Instructions (Signed)
Follow up as scheduled Continue the allergy medications daily Use the Tessalon cough pills up to 3x/day You can add Mucinex DM to the Tessalon for added cough and congestion relief Drink plenty of fluids Your lungs sound great and this should improve w/ time If symptoms change or worsen, let me know!! Call with any questions or concerns Have a great summer!!!

## 2016-05-01 ENCOUNTER — Ambulatory Visit (INDEPENDENT_AMBULATORY_CARE_PROVIDER_SITE_OTHER): Payer: Medicare Other | Admitting: Family Medicine

## 2016-05-01 ENCOUNTER — Encounter: Payer: Self-pay | Admitting: Family Medicine

## 2016-05-01 VITALS — BP 120/76 | HR 59 | Ht 65.0 in | Wt 230.0 lb

## 2016-05-01 DIAGNOSIS — M25562 Pain in left knee: Secondary | ICD-10-CM

## 2016-05-01 DIAGNOSIS — I701 Atherosclerosis of renal artery: Secondary | ICD-10-CM

## 2016-05-01 DIAGNOSIS — R2681 Unsteadiness on feet: Secondary | ICD-10-CM | POA: Diagnosis present

## 2016-05-01 DIAGNOSIS — M25561 Pain in right knee: Secondary | ICD-10-CM | POA: Diagnosis not present

## 2016-05-01 MED ORDER — METHYLPREDNISOLONE ACETATE 40 MG/ML IJ SUSP
40.0000 mg | Freq: Once | INTRAMUSCULAR | Status: AC
  Administered 2016-05-01: 40 mg via INTRA_ARTICULAR

## 2016-05-01 NOTE — Patient Instructions (Signed)
Your left knee pain is primarily due to arthritis. These are the medicines you can take for this: Tylenol '500mg'$  1-2 tabs three times a day for pain. Glucosamine sulfate '750mg'$  twice a day is a supplement that may help. Capsaicin, aspercreme, or biofreeze topically up to four times a day may also help with pain. Cortisone injections are an option - you were given these today. If cortisone injections do not help, there are different types of shots that may help but they take longer to take effect. It's important that you continue to stay active. Straight leg raises, knee extensions 3 sets of 10 once a day (add ankle weight if these become too easy). Consider physical therapy to strengthen muscles around the joint that hurts to take pressure off of the joint itself. Shoe inserts with good arch support may be helpful. Walker or cane if needed. Heat or ice 15 minutes at a time 3-4 times a day as needed to help with pain. Water aerobics and cycling with low resistance are the best two types of exercise for arthritis.  We could do ankle injections over a week from now if you want to do these.

## 2016-05-02 DIAGNOSIS — M25562 Pain in left knee: Secondary | ICD-10-CM

## 2016-05-02 DIAGNOSIS — M25561 Pain in right knee: Secondary | ICD-10-CM | POA: Insufficient documentation

## 2016-05-02 NOTE — Assessment & Plan Note (Signed)
2/2 DJD.  Did well with injection to left knee previously.  Discussed tylenol, glucosamine, topical medications.  Bilateral cortisone injections given today.  Home exercises reviewed.  Heat/ice, continue with cane.  After informed written consent, patient was seated on exam table. Right knee was prepped with alcohol swab and utilizing anteromedial approach, patient's right knee was injected intraarticularly with 3:1 marcaine: depomedrol. Patient tolerated the procedure well without immediate complications.  After informed written consent, patient was seated on exam table. Left knee was prepped with alcohol swab and utilizing anteromedial approach, patient's left knee was injected intraarticularly with 3:1 marcaine: depomedrol. Patient tolerated the procedure well without immediate complications.

## 2016-05-02 NOTE — Progress Notes (Signed)
PCP and consultation requested by: Susan Asa, MD  Subjective:   HPI: Patient is a 75 y.o. female here for bilateral knee pain.  3/8: Patient reports she's had off and on pain in left knee and left hip that worsened after coming off her prednisone (for IgA nephropathy). Was on this for two years and just off past two months. Pain in left knee is mainly posterior, 0/10 at rest, up to 10/10 and sharp at times. Worse with ambulation. Has a history of a bakers cyst here. Hip pain is lateral, worse and sharp when lying down on this side. Pain here 0/10 at rest, 7/10 at worst. No skin changes, fever, other complaints.  4/7: Patient reports she feels much better. No pain with walking in knee or hip. Pain level 0/10. Some soreness only to touch in these areas and if lying on the left side. No numbness, skin changes.  6/28: Patient reports both of her knees are hurting now. Pain level is 0/10 at rest, up to 8/10 and very stiff, achy with walking. Using a cane. Most pain anterior knees - radiates some posteriorly. She has pain in ankles as well anteriorly. No numbness, skin changes.  Past Medical History  Diagnosis Date  . Asthma   . Hypertension   . GERD (gastroesophageal reflux disease)   . Allergy   . Hypothyroid   . Osteoporosis   . Gastric ulcer   . Hiatal hernia   . Hyperlipidemia     "don't take RX for it" (11/08/2013)  . Renal artery stenosis ([place])     with stent placement  . Lung mass     superior segment LLL/notes 11/08/2013  . Pneumonia     "I've had it ?3 times" (11/08/2013)  . Arthritis     "knees, hips, back, hands" (11/08/2013)  . DJD (degenerative joint disease)   . Lung nodule/ adenoca > LLobectomy 12/15/13  11/08/2013    PET 11/23/2013 1. Dominant sub solid left lower lobe nodule does not demonstrate significantly increased metabolic activity. However, morphologically, adenocarcinoma is still a concern.   - Spirometry 11/24/13 wnl  - 11/24/2013 T surg eval rec>  LLower lobectomy 12/15/2013 for adenoca   . CAD (coronary artery disease), non obstructive by cardiac cath 12/12/13 01/24/2014  . Diastolic CHF ([place])   . CKD (chronic kidney disease), stage III   . Nephropathy   . Basal cell carcinoma     "cut them off face & right shoulder" (11/08/2013)    Current Outpatient Prescriptions on File Prior to Visit  Medication Sig Dispense Refill  . albuterol (PROVENTIL HFA;VENTOLIN HFA) 108 (90 BASE) MCG/ACT inhaler Inhale 1-2 puffs into the lungs every 6 (six) hours as needed for wheezing or shortness of breath. 18 g 1  . allopurinol (ZYLOPRIM) 100 MG tablet Take 100 mg by mouth 2 (two) times daily. Takes 2 tablets in am    . benzonatate (TESSALON) 200 MG capsule Take 1 capsule (200 mg total) by mouth 2 (two) times daily as needed for cough. 60 capsule 0  . BYSTOLIC 20 MG TABS TAKE ONE TABLET BY MOUTH ONCE DAILY 30 tablet 6  . Calcium Carb-Cholecalciferol 600-800 MG-UNIT TABS Take 2 tablets by mouth daily.     . cetirizine (ZYRTEC) 10 MG tablet Take 10 mg by mouth at bedtime.     . fluticasone (FLONASE) 50 MCG/ACT nasal spray USE TWO SPRAY(S) IN EACH NOSTRIL ONCE DAILY 16 g 2  . furosemide (LASIX) 40 MG tablet TAKE ONE TABLET BY  MOUTH IN THE EVENING UNTIL  WEIGHT  OF  219  IS  REACHED 90 tablet 3  . furosemide (LASIX) 80 MG tablet TAKE ONE TABLET BY MOUTH IN THE MORNING 90 tablet 0  . glucose blood (ONE TOUCH ULTRA TEST) test strip Dx E09.9. Please use one strip each time sugars are checked. Pt tests 2 times daily. 100 each 6  . hydrALAZINE (APRESOLINE) 50 MG tablet TAKE ONE TABLET BY MOUTH TWICE DAILY 60 tablet 6  . Lancets (ONETOUCH ULTRASOFT) lancets Use 1 per day dx code 249.00 100 each 12  . lansoprazole (PREVACID) 15 MG capsule Take 15 mg by mouth daily at 12 noon.    Marland Kitchen levothyroxine (SYNTHROID, LEVOTHROID) 88 MCG tablet TAKE ONE TABLET BY MOUTH ONCE DAILY BEFORE  BREAKFAST 90 tablet 0  . ONETOUCH DELICA LANCETS 87O MISC Use to check blood sugar once daily as  instructed. Dx code: E11.9 100 each 2  . sertraline (ZOLOFT) 50 MG tablet TAKE ONE & ONE-HALF TABLETS BY MOUTH ONCE DAILY 45 tablet 6   No current facility-administered medications on file prior to visit.    Past Surgical History  Procedure Laterality Date  . Knee arthroscopy Bilateral   . Anterior cervical decomp/discectomy fusion  2000    C5-C6  . Renal artery stent Right 11/08/2013    Archie Endo 11/08/2013  . Tonsillectomy and adenoidectomy  ?1946  . Cholecystectomy  1970's  . Vaginal hysterectomy  1970  . Dilation and curettage of uterus  1960's  . Skin cancer excision    . Coronary angiogram      Hx: of 2/ 2015  . Cardiac catheterization      Hx: of 2/ 2015  . Video bronchoscopy N/A 12/15/2013    Procedure: VIDEO BRONCHOSCOPY;  Surgeon: Grace Isaac, MD;  Location: [facility] OR;  Service: Thoracic;  Laterality: N/A;  . Video assisted thoracoscopy (vats)/wedge resection Left 12/15/2013    Procedure: VIDEO ASSISTED THORACOSCOPY (VATS)/WEDGE RESECTION;  Surgeon: Grace Isaac, MD;  Location: [place];  Service: Thoracic;  Laterality: Left;  . Lobectomy Left 12/15/2013    Procedure: LOBECTOMY;  Surgeon: Grace Isaac, MD;  Location: [place];  Service: Thoracic;  Laterality: Left;  . Renal angiogram Bilateral 11/08/2013    Procedure: RENAL ANGIOGRAM;  Surgeon: Lorretta Harp, MD;  Location: [facility] CATH LAB;  Service: Cardiovascular;  Laterality: Bilateral;  . Percutaneous stent intervention Right 11/08/2013    Procedure: PERCUTANEOUS STENT INTERVENTION;  Surgeon: Lorretta Harp, MD;  Location: [facility] CATH LAB;  Service: Cardiovascular;  Laterality: Right;  rt renal artery stent  . Left heart catheterization with coronary angiogram N/A 12/13/2013    Procedure: LEFT HEART CATHETERIZATION WITH CORONARY ANGIOGRAM;  Surgeon: Burnell Blanks, MD;  Location: [facility] CATH LAB;  Service: Cardiovascular;  Laterality: N/A;    Allergies  Allergen Reactions  . Benazepril Hcl Anaphylaxis and Cough  .  Loratadine Anaphylaxis    anaphylaxis  . Allegra [Fexofenadine Hcl] Other (See Comments)    Severe back pain  . Celecoxib Other (See Comments)    REACTION: aching  . Lisinopril     REACTION: cough  . Sulfa Antibiotics Hives    Social History   Social History  . Marital Status: Married    Spouse Name: N/A  . Number of Children: N/A  . Years of Education: N/A   Occupational History  . Retired    Social History Main Topics  . Smoking status: Never Smoker   . Smokeless tobacco: Never  Used  . Alcohol Use: No  . Drug Use: No  . Sexual Activity: Yes   Other Topics Concern  . Not on file   Social History Narrative    Family History  Problem Relation Age of Onset  . Breast cancer Sister   . Emphysema Father     smoked  . Allergies Sister   . Allergies Brother   . Heart disease Mother   . Heart disease Father   . Diabetes Father   . Diabetes Brother   . Stroke Father   . Heart failure Neg Hx     BP 120/76 mmHg  Pulse 59  Ht '5\' 5"'$  (1.651 m)  Wt 230 lb (104.327 kg)  BMI 38.27 kg/m2  Review of Systems: See HPI above.    Objective:  Physical Exam:  Gen: NAD, comfortable in exam room  Bilateral knees: No gross deformity, ecchymoses, effusions. TTP medial > lateral joint lines. FROM. Negative ant/post drawers. Negative valgus/varus testing. Negative lachmanns. Negative mcmurrays, apleys, patellar apprehension. NV intact distally.    Assessment & Plan:  1. Bilateral knee pain - 2/2 DJD.  Did well with injection to left knee previously.  Discussed tylenol, glucosamine, topical medications.  Bilateral cortisone injections given today.  Home exercises reviewed.  Heat/ice, continue with cane.  After informed written consent, patient was seated on exam table. Right knee was prepped with alcohol swab and utilizing anteromedial approach, patient's right knee was injected intraarticularly with 3:1 marcaine: depomedrol. Patient tolerated the procedure well without  immediate complications.  After informed written consent, patient was seated on exam table. Left knee was prepped with alcohol swab and utilizing anteromedial approach, patient's left knee was injected intraarticularly with 3:1 marcaine: depomedrol. Patient tolerated the procedure well without immediate complications.  She will likely return for full evaluation of anterior ankle pain.

## 2016-05-13 ENCOUNTER — Ambulatory Visit: Payer: Medicare Other | Attending: Family Medicine | Admitting: Physical Therapy

## 2016-05-13 DIAGNOSIS — R262 Difficulty in walking, not elsewhere classified: Secondary | ICD-10-CM | POA: Diagnosis not present

## 2016-05-13 DIAGNOSIS — R2689 Other abnormalities of gait and mobility: Secondary | ICD-10-CM | POA: Insufficient documentation

## 2016-05-13 DIAGNOSIS — M25561 Pain in right knee: Secondary | ICD-10-CM | POA: Diagnosis not present

## 2016-05-13 DIAGNOSIS — M25562 Pain in left knee: Secondary | ICD-10-CM | POA: Insufficient documentation

## 2016-05-14 DIAGNOSIS — R93429 Abnormal radiologic findings on diagnostic imaging of unspecified kidney: Secondary | ICD-10-CM | POA: Diagnosis not present

## 2016-05-14 DIAGNOSIS — Z6841 Body Mass Index (BMI) 40.0 and over, adult: Secondary | ICD-10-CM | POA: Diagnosis not present

## 2016-05-14 DIAGNOSIS — N184 Chronic kidney disease, stage 4 (severe): Secondary | ICD-10-CM | POA: Diagnosis not present

## 2016-05-14 DIAGNOSIS — R918 Other nonspecific abnormal finding of lung field: Secondary | ICD-10-CM | POA: Diagnosis not present

## 2016-05-14 DIAGNOSIS — N028 Recurrent and persistent hematuria with other morphologic changes: Secondary | ICD-10-CM | POA: Diagnosis not present

## 2016-05-14 DIAGNOSIS — I129 Hypertensive chronic kidney disease with stage 1 through stage 4 chronic kidney disease, or unspecified chronic kidney disease: Secondary | ICD-10-CM | POA: Diagnosis not present

## 2016-05-14 DIAGNOSIS — I701 Atherosclerosis of renal artery: Secondary | ICD-10-CM | POA: Diagnosis not present

## 2016-05-14 NOTE — Therapy (Signed)
[facility] [address]  [place], [place], [address] Phone: [phone]   Fax:  [phone]  Physical Therapy Evaluation  Patient Details  Name: Susan Pittman MRN: [id number] Date of Birth: 1941/01/12 Referring Provider: Dene Gentry, MD  Encounter Date: 05/13/2016      PT End of Session - 05/13/16 1536    Visit Number 1   Number of Visits 12   Date for PT Re-Evaluation 07/14/16   PT Start Time 1448   PT Stop Time 1536   PT Time Calculation (min) 48 min   Activity Tolerance Patient tolerated treatment well   Behavior During Therapy [facility] for tasks assessed/performed      Past Medical History  Diagnosis Date  . Asthma   . Hypertension   . GERD (gastroesophageal reflux disease)   . Allergy   . Hypothyroid   . Osteoporosis   . Gastric ulcer   . Hiatal hernia   . Hyperlipidemia     "don't take RX for it" (11/08/2013)  . Renal artery stenosis ([place])     with stent placement  . Lung mass     superior segment LLL/notes 11/08/2013  . Pneumonia     "I've had it ?3 times" (11/08/2013)  . Arthritis     "knees, hips, back, hands" (11/08/2013)  . DJD (degenerative joint disease)   . Lung nodule/ adenoca > LLobectomy 12/15/13  11/08/2013    PET 11/23/2013 1. Dominant sub solid left lower lobe nodule does not demonstrate significantly increased metabolic activity. However, morphologically, adenocarcinoma is still a concern.   - Spirometry 11/24/13 wnl  - 11/24/2013 T surg eval rec> LLower lobectomy 12/15/2013 for adenoca   . CAD (coronary artery disease), non obstructive by cardiac cath 12/12/13 01/24/2014  . Diastolic CHF ([place])   . CKD (chronic kidney disease), stage III   . Nephropathy   . Basal cell carcinoma     "cut them off face & right shoulder" (11/08/2013)    Past Surgical History  Procedure Laterality Date  . Knee arthroscopy Bilateral   . Anterior cervical decomp/discectomy fusion  2000    C5-C6  . Renal artery  stent Right 11/08/2013    Archie Endo 11/08/2013  . Tonsillectomy and adenoidectomy  ?1946  . Cholecystectomy  1970's  . Vaginal hysterectomy  1970  . Dilation and curettage of uterus  1960's  . Skin cancer excision    . Coronary angiogram      Hx: of 2/ 2015  . Cardiac catheterization      Hx: of 2/ 2015  . Video bronchoscopy N/A 12/15/2013    Procedure: VIDEO BRONCHOSCOPY;  Surgeon: Grace Isaac, MD;  Location: [facility] OR;  Service: Thoracic;  Laterality: N/A;  . Video assisted thoracoscopy (vats)/wedge resection Left 12/15/2013    Procedure: VIDEO ASSISTED THORACOSCOPY (VATS)/WEDGE RESECTION;  Surgeon: Grace Isaac, MD;  Location: [facility];  Service: Thoracic;  Laterality: Left;  . Lobectomy Left 12/15/2013    Procedure: LOBECTOMY;  Surgeon: Grace Isaac, MD;  Location: [facility];  Service: Thoracic;  Laterality: Left;  . Renal angiogram Bilateral 11/08/2013    Procedure: RENAL ANGIOGRAM;  Surgeon: Lorretta Harp, MD;  Location: [facility] CATH LAB;  Service: Cardiovascular;  Laterality: Bilateral;  . Percutaneous stent intervention Right 11/08/2013    Procedure: PERCUTANEOUS STENT INTERVENTION;  Surgeon: Lorretta Harp, MD;  Location: [facility] CATH LAB;  Service: Cardiovascular;  Laterality: Right;  rt renal artery stent  .  Left heart catheterization with coronary angiogram N/A 12/13/2013    Procedure: LEFT HEART CATHETERIZATION WITH CORONARY ANGIOGRAM;  Surgeon: Burnell Blanks, MD;  Location: [facility] CATH LAB;  Service: Cardiovascular;  Laterality: N/A;    There were no vitals filed for this visit.       Subjective Assessment - 05/13/16 1455    Subjective Pt reports she has noticed increased difficulty walking over the past 2 years due to weakness and instability from chronic steroid use for IGA neprhopathy. Uses cane whenever going out but typically walks without AD in the house. Received injections in both knees ~2 weeks ago which has helped with pain.   Limitations Standing;Walking   How long can  you stand comfortably? 5-10 minutes   How long can you walk comfortably? 5 minutes on a bad day   Diagnostic tests n/a   Patient Stated Goals "To be able to walk w/o my cane"   Currently in Pain? No/denies   Pain Score 0-No pain  Least 0/10, Avg 6-7/10, Worst 10/10   Pain Location Knee   Pain Orientation Right;Left  Varies as to which knee may hurt the worst   Pain Descriptors / Indicators Constant   Pain Type Chronic pain   Pain Onset More than a month ago   Pain Frequency Intermittent   Aggravating Factors  prolonged standing or walking   Pain Relieving Factors heat is better than ice   Effect of Pain on Daily Activities limits all daily activities and household chores            [facility] PT Assessment - 05/13/16 1448    Assessment   Medical Diagnosis Gait instability; B knee OA   Referring Provider Dene Gentry, MD   Onset Date/Surgical Date --  ~2 yrs   Next MD Visit TBD   Prior Therapy Summer 2015   Balance Screen   Has the patient fallen in the past 6 months No   Has the patient had a decrease in activity level because of a fear of falling?  No   Is the patient reluctant to leave their home because of a fear of falling?  No   Home Environment   Living Environment Private residence   Type of [place] to enter   Entrance Stairs-Number of Steps 1   Entrance Stairs-Rails None   Prior Function   Level of Independence Independent with household mobility without device;Independent with community mobility with device;Needs assistance with homemaking   Vocation Retired   Leisure Gym for rec bike (daily) & chair yoga (2x/wk)   Observation/Other Assessments   Focus on Therapeutic Outcomes (FOTO)  Neuromuscular disorder - 49% (51% limitation); Predicted 55% (45% limitation)   Functional Tests   Functional tests Single leg stance   Single Leg Stance   Comments R hip drop with SLS on L   ROM / Strength   AROM / PROM / Strength AROM;Strength   AROM    AROM Assessment Site Knee   Right/Left Knee Right;Left   Right Knee Extension 3   Right Knee Flexion 122   Left Knee Extension 0   Left Knee Flexion 123   Strength   Strength Assessment Site Hip;Knee;Ankle   Right/Left Hip Right;Left   Right Hip Flexion 4-/5   Right Hip Extension 3-/5   Right Hip ABduction 4-/5   Right Hip ADduction 4-/5   Left Hip Flexion 3+/5   Left Hip Extension 3-/5   Left Hip ABduction  2+/5   Left Hip ADduction 3+/5   Right/Left Knee Right;Left   Right Knee Flexion 4-/5   Right Knee Extension 4/5   Left Knee Flexion 4/5   Left Knee Extension 4/5   Right/Left Ankle Right;Left   Right Ankle Dorsiflexion 4+/5   Right Ankle Inversion 4+/5   Right Ankle Eversion 4+/5   Left Ankle Dorsiflexion 4+/5   Left Ankle Inversion 4+/5   Left Ankle Eversion 4+/5   Ambulation/Gait   Assistive device Straight cane;None   Gait Pattern Trendelenburg;Lateral hip instability  trendelenburg on R w/o AD   Standardized Balance Assessment   Standardized Balance Assessment Berg Balance Test;Timed Up and Go Test;10 meter walk test   10 Meter Walk 1.75 ft/sec  18.72"   Berg Balance Test   Sit to Stand Able to stand without using hands and stabilize independently   Standing Unsupported Able to stand safely 2 minutes   Sitting with Back Unsupported but Feet Supported on Floor or Stool Able to sit safely and securely 2 minutes   Stand to Sit Sits safely with minimal use of hands   Transfers Able to transfer safely, minor use of hands   Standing Unsupported with Eyes Closed Able to stand 10 seconds with supervision   Standing Ubsupported with Feet Together Able to place feet together independently and stand 1 minute safely   From Standing, Reach Forward with Outstretched Arm Can reach forward >12 cm safely (5")   From Standing Position, Pick up Object from Floor Able to pick up shoe, needs supervision   From Standing Position, Turn to Look Behind Over each Shoulder Looks behind  one side only/other side shows less weight shift   Turn 360 Degrees Able to turn 360 degrees safely but slowly   Standing Unsupported, Alternately Place Feet on Step/Stool Able to stand independently and complete 8 steps >20 seconds   Standing Unsupported, One Foot in [employer] balance while stepping or standing   Standing on One Leg Tries to lift leg/unable to hold 3 seconds but remains standing independently   Total Score 42   Berg comment: 37-45 significantfall risk (>80%)    Timed Up and Go Test   TUG Normal TUG   Normal TUG (seconds) 16.03  w/o AD                PT Short Term Goals - 05/13/16 1539    PT SHORT TERM GOAL #1   Title Independent with initial HEP by 06/07/16   Status New           PT Long Term Goals - 05/13/16 1541    PT LONG TERM GOAL #1   Title Independent with advanced HEP as indicated by 07/14/16   Status New   PT LONG TERM GOAL #2   Title B hip strength >/= 4/5 for improved gait stability by 07/14/16   Status New   PT LONG TERM GOAL #3   Title Pt will complete TUG in </= 13.5 sec to decrease risk for falls by 07/14/16   Status New   PT LONG TERM GOAL #4   Title Pt will demonstrate gait speed >/= 2.6 ft/sec to allow for improved safety with community ambulation by 07/14/16   Status New   PT LONG TERM GOAL #5   Title Pt will ambulate community distances w/o SPC w/o evidence of trendelenburg gait pattern by 07/14/16   Status New  Plan - 06/02/2016 1536    Clinical Impression Statement Pam[place] is a 75 y/o female who presents to OP PT for gait instability associated with B knee pain from OA.  Pt denies any pain at time of eval but states B knee pain typically 6-7/10 on average and can be up to 10/10, with one knee worse than the other some days but no consistent pattern noted. Pt received injections to B knees ~2 weeks ago and states knee pain has been better since. Assessment revealed bilateral LE weakness, proximal > distal and L > R  at hips with knees and ankles symmetrical (refer to above MMT). Gait with significant Trendelenburg pattern noted on R with gait speed decreased to 1.75 ft/sec on 10 MWT and TUG time = 16.03 sec. Balance testing via the Berg Balance Scale (42/56) indicates a significant risk (>80%) for falls and the FGA score of 16 also indicates a high risk for falls. POC will focus on core/proximal LE strengthening to improve balance and stability, along with balance and dynamic gait activities to decrease risk for falls. Modalities to be incorporated as needed for pain management.   Rehab Potential Good   PT Frequency 2x / week   PT Duration 6 weeks   PT Treatment/Interventions Patient/family education;Therapeutic exercise;Neuromuscular re-education;Gait training;Manual techniques;Taping;Electrical Stimulation;Moist Heat;Cryotherapy;Vasopneumatic Device;Therapeutic activities;ADLs/Self Care Home Management   PT Next Visit Plan Create initial HEP with emphasis on proximal LE/core strenthening   Consulted and Agree with Plan of Care Patient      Patient will benefit from skilled therapeutic intervention in order to improve the following deficits and impairments:  Pain, Decreased strength, Difficulty walking, Abnormal gait, Decreased balance, Decreased activity tolerance, Decreased endurance, Decreased range of motion  Visit Diagnosis: Difficulty in walking, not elsewhere classified  Other abnormalities of gait and mobility  Pain in left knee  Pain in right knee      G-Codes - 06-02-2016 1530    Functional Assessment Tool Used TUG = 16.03 sec (44.4% Impaired)   Functional Limitation Mobility: Walking and moving around   Mobility: Walking and Moving Around Current Status ([id number]) At least 40 percent but less than 60 percent impaired, limited or restricted   Mobility: Walking and Moving Around Goal Status [phone]) At least 20 percent but less than 40 percent impaired, limited or restricted       Problem  List Patient Active Problem List   Diagnosis Date Noted  . Bilateral knee pain 05/02/2016  . Trochanteric bursitis of left hip 01/11/2016  . Low back pain 10/02/2015  . Anxiety state 06/28/2015  . Fatigue 06/28/2015  . Shingles 11/24/2014  . Tremor of both hands 09/21/2014  . Gout due to renal impairment 08/30/2014  . Pedal edema 08/23/2014  . Physical deconditioning 06/23/2014  . Leukocytosis 06/13/2014  . UTI (lower urinary tract infection) 06/13/2014  . CKD (chronic kidney disease) stage 3, GFR 30-59 ml/min 05/27/2014  . IgA nephropathy 05/27/2014  . Chronic diastolic heart failure ([place]) 05/03/2014  . Dyspnea 04/23/2014  . Steroid-induced diabetes ([place]) 04/18/2014  . Acute bronchitis 04/18/2014  . Obesity (BMI 30-39.9) 04/12/2014  . Hematuria 02/25/2014  . Diastolic dysfunction- grade 2 02/16/2014  . DOE (dyspnea on exertion) 02/11/2014  . Leg edema 02/11/2014  . Edema of both legs 02/04/2014  . CAD (coronary artery disease), non obstructive by cardiac cath 12/12/13 01/24/2014  . SOB (shortness of breath) 01/18/2014  . CKD (chronic kidney disease) stage 4, GFR 15-29 ml/min (HCC) 01/03/2014  . Lung cancer, left  lower lobe 12/17/2013  . Status post lobectomy of lung 12/15/2013  . Chest pain with moderate risk of acute coronary syndrome 12/12/2013  . Family history of coronary artery bypass surgery 12/12/2013  . Renal artery atherosclerosis, s/p right RA stent 11/08/13 11/08/2013  . Lung nodule/ adenoca > LLobectomy 12/15/13  11/08/2013  . Premature atrial contractions 10/31/2013  . Hearing loss secondary to cerumen impaction 09/20/2013  . Allergic rhinitis 02/11/2013  . Shoulder pain 03/11/2012  . Upper arm pain 01/26/2012  . General medical examination 07/25/2011  . Obesity- BMI 41 05/10/2011  . Dizziness 04/10/2011  . Hyperlipidemia 02/28/2011  . EDEMA- LOCALIZED 06/04/2010  . OSTEOPENIA 01/15/2010  . KNEE PAIN 11/10/2009  . CARCINOMA, BASAL CELL 09/22/2009  . SKIN  LESION 09/18/2009  . ESOPHAGITIS 05/19/2009  . GASTRIC ULCER 05/19/2009  . HIATAL HERNIA 05/19/2009  . DEGENERATIVE JOINT DISEASE 05/19/2009  . Hypothyroidism 03/23/2009  . Essential hypertension 03/23/2009  . ASTHMA 03/23/2009  . GERD 03/23/2009  . COLONIC POLYPS, HX OF 03/23/2009    Percival Spanish, PT, MPT 05/14/2016, 9:05 AM  [facility] [address]  [place], [place], [address] Phone: [phone]   Fax:  [phone]  Name: ATALAYA ZAPPIA MRN: [id number] Date of Birth: 1941/01/13

## 2016-05-16 ENCOUNTER — Ambulatory Visit: Payer: Medicare Other

## 2016-05-16 DIAGNOSIS — M25561 Pain in right knee: Secondary | ICD-10-CM

## 2016-05-16 DIAGNOSIS — R2689 Other abnormalities of gait and mobility: Secondary | ICD-10-CM | POA: Diagnosis not present

## 2016-05-16 DIAGNOSIS — M25562 Pain in left knee: Secondary | ICD-10-CM

## 2016-05-16 DIAGNOSIS — R262 Difficulty in walking, not elsewhere classified: Secondary | ICD-10-CM

## 2016-05-16 NOTE — Therapy (Signed)
[facility] [address]  [place], [place], [address] Phone: [phone]   Fax:  [phone]  Physical Therapy Treatment  Patient Details  Name: Susan Pittman MRN: [id number] Date of Birth: Sep 02, 1941 Referring Provider: Dene Gentry, MD  Encounter Date: 05/16/2016      PT End of Session - 05/16/16 1510    Visit Number 2   Number of Visits 12   Date for PT Re-Evaluation 07/14/16   PT Start Time 6387   PT Stop Time 1532   PT Time Calculation (min) 40 min   Activity Tolerance Patient tolerated treatment well   Behavior During Therapy [facility] for tasks assessed/performed      Past Medical History  Diagnosis Date  . Asthma   . Hypertension   . GERD (gastroesophageal reflux disease)   . Allergy   . Hypothyroid   . Osteoporosis   . Gastric ulcer   . Hiatal hernia   . Hyperlipidemia     "don't take RX for it" (11/08/2013)  . Renal artery stenosis ([place])     with stent placement  . Lung mass     superior segment LLL/notes 11/08/2013  . Pneumonia     "I've had it ?3 times" (11/08/2013)  . Arthritis     "knees, hips, back, hands" (11/08/2013)  . DJD (degenerative joint disease)   . Lung nodule/ adenoca > LLobectomy 12/15/13  11/08/2013    PET 11/23/2013 1. Dominant sub solid left lower lobe nodule does not demonstrate significantly increased metabolic activity. However, morphologically, adenocarcinoma is still a concern.   - Spirometry 11/24/13 wnl  - 11/24/2013 T surg eval rec> LLower lobectomy 12/15/2013 for adenoca   . CAD (coronary artery disease), non obstructive by cardiac cath 12/12/13 01/24/2014  . Diastolic CHF ([place])   . CKD (chronic kidney disease), stage III   . Nephropathy   . Basal cell carcinoma     "cut them off face & right shoulder" (11/08/2013)    Past Surgical History  Procedure Laterality Date  . Knee arthroscopy Bilateral   . Anterior cervical decomp/discectomy fusion  2000    C5-C6  . Renal artery  stent Right 11/08/2013    Archie Endo 11/08/2013  . Tonsillectomy and adenoidectomy  ?1946  . Cholecystectomy  1970's  . Vaginal hysterectomy  1970  . Dilation and curettage of uterus  1960's  . Skin cancer excision    . Coronary angiogram      Hx: of 2/ 2015  . Cardiac catheterization      Hx: of 2/ 2015  . Video bronchoscopy N/A 12/15/2013    Procedure: VIDEO BRONCHOSCOPY;  Surgeon: Grace Isaac, MD;  Location: [facility] OR;  Service: Thoracic;  Laterality: N/A;  . Video assisted thoracoscopy (vats)/wedge resection Left 12/15/2013    Procedure: VIDEO ASSISTED THORACOSCOPY (VATS)/WEDGE RESECTION;  Surgeon: Grace Isaac, MD;  Location: [facility];  Service: Thoracic;  Laterality: Left;  . Lobectomy Left 12/15/2013    Procedure: LOBECTOMY;  Surgeon: Grace Isaac, MD;  Location: [facility];  Service: Thoracic;  Laterality: Left;  . Renal angiogram Bilateral 11/08/2013    Procedure: RENAL ANGIOGRAM;  Surgeon: Lorretta Harp, MD;  Location: [facility] CATH LAB;  Service: Cardiovascular;  Laterality: Bilateral;  . Percutaneous stent intervention Right 11/08/2013    Procedure: PERCUTANEOUS STENT INTERVENTION;  Surgeon: Lorretta Harp, MD;  Location: [facility] CATH LAB;  Service: Cardiovascular;  Laterality: Right;  rt renal artery stent  .  Left heart catheterization with coronary angiogram N/A 12/13/2013    Procedure: LEFT HEART CATHETERIZATION WITH CORONARY ANGIOGRAM;  Surgeon: Burnell Blanks, MD;  Location: [facility] CATH LAB;  Service: Cardiovascular;  Laterality: N/A;    There were no vitals filed for this visit.      Subjective Assessment - 05/16/16 1503    Subjective Pt. reports she has been performing chair aerobics 2 x/wk.  No pain or complaints reported.     Patient Stated Goals "To be able to walk w/o my cane"   Currently in Pain? No/denies   Pain Score 0-No pain   Multiple Pain Sites No      Today's Treatment:  Therex: NuStep: level 4, 4 min  Hooklying abdom. Bracing with alteranting LE march x 10  reps each side Hooklying alternating hip abd/ER with blue TB x 10 reps each  Hooklying B hip abd/ER with blue TB x 15 reps  B sidelying clam shell with blue TB x 10 reps  Standing march holding onto chair x 10 reps each  Seated march x 10 reps each side  BATCA low row 10# x 15 reps Supine B HS, glute, SKTC stretch x 30 sec each       PT Education - 05/16/16 1700    Education provided Yes   Person(s) Educated Patient   Methods Handout;Explanation;Verbal cues   Comprehension Need further instruction;Verbal cues required;Verbalized understanding          PT Short Term Goals - 05/16/16 1828    PT SHORT TERM GOAL #1   Title Independent with initial HEP by 06/07/16   Status On-going           PT Long Term Goals - 05/16/16 1828    PT LONG TERM GOAL #1   Title Independent with advanced HEP as indicated by 07/14/16   Status On-going   PT LONG TERM GOAL #2   Title B hip strength >/= 4/5 for improved gait stability by 07/14/16   Status On-going   PT LONG TERM GOAL #3   Title Pt will complete TUG in </= 13.5 sec to decrease risk for falls by 07/14/16   Status On-going   PT LONG TERM GOAL #4   Title Pt will demonstrate gait speed >/= 2.6 ft/sec to allow for improved safety with community ambulation by 07/14/16   Status On-going   PT LONG TERM GOAL #5   Title Pt will ambulate community distances w/o SPC w/o evidence of trendelenburg gait pattern by 07/14/16   Status On-going               Plan - 05/16/16 1658    Clinical Impression Statement Pt. reports she has been performing chair aerobics 2x/wk.  No pain or complaints reported.  Initial hip strengthening HEP created today with good pt. response; pt. tolerated all activities without pain with focus on HEP on bridging, hip abduction, and hip flexion strengthening activities.     PT Treatment/Interventions Patient/family education;Therapeutic exercise;Neuromuscular re-education;Gait training;Manual techniques;Taping;Electrical  Stimulation;Moist Heat;Cryotherapy;Vasopneumatic Device;Therapeutic activities;ADLs/Self Care Home Management   PT Next Visit Plan Assess response to initial HEP with emphasis on proximal LE/core strenthening      Patient will benefit from skilled therapeutic intervention in order to improve the following deficits and impairments:  Pain, Decreased strength, Difficulty walking, Abnormal gait, Decreased balance, Decreased activity tolerance, Decreased endurance, Decreased range of motion  Visit Diagnosis: Difficulty in walking, not elsewhere classified  Other abnormalities of gait and mobility  Pain in left  knee  Pain in right knee     Problem List Patient Active Problem List   Diagnosis Date Noted  . Bilateral knee pain 05/02/2016  . Trochanteric bursitis of left hip 01/11/2016  . Low back pain 10/02/2015  . Anxiety state 06/28/2015  . Fatigue 06/28/2015  . Shingles 11/24/2014  . Tremor of both hands 09/21/2014  . Gout due to renal impairment 08/30/2014  . Pedal edema 08/23/2014  . Physical deconditioning 06/23/2014  . Leukocytosis 06/13/2014  . UTI (lower urinary tract infection) 06/13/2014  . CKD (chronic kidney disease) stage 3, GFR 30-59 ml/min 05/27/2014  . IgA nephropathy 05/27/2014  . Chronic diastolic heart failure ([place]) 05/03/2014  . Dyspnea 04/23/2014  . Steroid-induced diabetes ([place]) 04/18/2014  . Acute bronchitis 04/18/2014  . Obesity (BMI 30-39.9) 04/12/2014  . Hematuria 02/25/2014  . Diastolic dysfunction- grade 2 02/16/2014  . DOE (dyspnea on exertion) 02/11/2014  . Leg edema 02/11/2014  . Edema of both legs 02/04/2014  . CAD (coronary artery disease), non obstructive by cardiac cath 12/12/13 01/24/2014  . SOB (shortness of breath) 01/18/2014  . CKD (chronic kidney disease) stage 4, GFR 15-29 ml/min (HCC) 01/03/2014  . Lung cancer, left lower lobe 12/17/2013  . Status post lobectomy of lung 12/15/2013  . Chest pain with moderate risk of acute coronary  syndrome 12/12/2013  . Family history of coronary artery bypass surgery 12/12/2013  . Renal artery atherosclerosis, s/p right RA stent 11/08/13 11/08/2013  . Lung nodule/ adenoca > LLobectomy 12/15/13  11/08/2013  . Premature atrial contractions 10/31/2013  . Hearing loss secondary to cerumen impaction 09/20/2013  . Allergic rhinitis 02/11/2013  . Shoulder pain 03/11/2012  . Upper arm pain 01/26/2012  . General medical examination 07/25/2011  . Obesity- BMI 41 05/10/2011  . Dizziness 04/10/2011  . Hyperlipidemia 02/28/2011  . EDEMA- LOCALIZED 06/04/2010  . OSTEOPENIA 01/15/2010  . KNEE PAIN 11/10/2009  . CARCINOMA, BASAL CELL 09/22/2009  . SKIN LESION 09/18/2009  . ESOPHAGITIS 05/19/2009  . GASTRIC ULCER 05/19/2009  . HIATAL HERNIA 05/19/2009  . DEGENERATIVE JOINT DISEASE 05/19/2009  . Hypothyroidism 03/23/2009  . Essential hypertension 03/23/2009  . ASTHMA 03/23/2009  . GERD 03/23/2009  . COLONIC POLYPS, HX OF 03/23/2009    Bess Harvest, PTA 05/16/2016, 6:39 PM  [facility] [address]  [place], [place], [address] Phone: [phone]   Fax:  [phone]  Name: Susan Pittman MRN: [id number] Date of Birth: [birth year]

## 2016-05-20 ENCOUNTER — Other Ambulatory Visit: Payer: Self-pay | Admitting: Family Medicine

## 2016-05-20 ENCOUNTER — Ambulatory Visit: Payer: Medicare Other

## 2016-05-20 DIAGNOSIS — R262 Difficulty in walking, not elsewhere classified: Secondary | ICD-10-CM | POA: Diagnosis not present

## 2016-05-20 DIAGNOSIS — M25561 Pain in right knee: Secondary | ICD-10-CM | POA: Diagnosis not present

## 2016-05-20 DIAGNOSIS — M25562 Pain in left knee: Secondary | ICD-10-CM

## 2016-05-20 DIAGNOSIS — R2689 Other abnormalities of gait and mobility: Secondary | ICD-10-CM | POA: Diagnosis not present

## 2016-05-20 NOTE — Telephone Encounter (Signed)
Medication filled to pharmacy as requested.   

## 2016-05-20 NOTE — Therapy (Signed)
[facility] [address]  [place], [place], [address] Phone: [phone]   Fax:  [phone]  Physical Therapy Treatment  Patient Details  Name: Susan Pittman MRN: [id number] Date of Birth: 12/29/1940 Referring Provider: Dene Gentry, MD  Encounter Date: 05/20/2016      PT End of Session - 05/20/16 1509    Visit Number 3   Number of Visits 12   Date for PT Re-Evaluation 07/14/16   PT Start Time 1452   PT Stop Time 1532   PT Time Calculation (min) 40 min   Activity Tolerance Patient tolerated treatment well   Behavior During Therapy [facility] for tasks assessed/performed      Past Medical History  Diagnosis Date  . Asthma   . Hypertension   . GERD (gastroesophageal reflux disease)   . Allergy   . Hypothyroid   . Osteoporosis   . Gastric ulcer   . Hiatal hernia   . Hyperlipidemia     "don't take RX for it" (11/08/2013)  . Renal artery stenosis ([place])     with stent placement  . Lung mass     superior segment LLL/notes 11/08/2013  . Pneumonia     "I've had it ?3 times" (11/08/2013)  . Arthritis     "knees, hips, back, hands" (11/08/2013)  . DJD (degenerative joint disease)   . Lung nodule/ adenoca > LLobectomy 12/15/13  11/08/2013    PET 11/23/2013 1. Dominant sub solid left lower lobe nodule does not demonstrate significantly increased metabolic activity. However, morphologically, adenocarcinoma is still a concern.   - Spirometry 11/24/13 wnl  - 11/24/2013 T surg eval rec> LLower lobectomy 12/15/2013 for adenoca   . CAD (coronary artery disease), non obstructive by cardiac cath 12/12/13 01/24/2014  . Diastolic CHF ([place])   . CKD (chronic kidney disease), stage III   . Nephropathy   . Basal cell carcinoma     "cut them off face & right shoulder" (11/08/2013)    Past Surgical History  Procedure Laterality Date  . Knee arthroscopy Bilateral   . Anterior cervical decomp/discectomy fusion  2000    C5-C6  . Renal artery  stent Right 11/08/2013    Archie Endo 11/08/2013  . Tonsillectomy and adenoidectomy  ?1946  . Cholecystectomy  1970's  . Vaginal hysterectomy  1970  . Dilation and curettage of uterus  1960's  . Skin cancer excision    . Coronary angiogram      Hx: of 2/ 2015  . Cardiac catheterization      Hx: of 2/ 2015  . Video bronchoscopy N/A 12/15/2013    Procedure: VIDEO BRONCHOSCOPY;  Surgeon: Grace Isaac, MD;  Location: [facility] OR;  Service: Thoracic;  Laterality: N/A;  . Video assisted thoracoscopy (vats)/wedge resection Left 12/15/2013    Procedure: VIDEO ASSISTED THORACOSCOPY (VATS)/WEDGE RESECTION;  Surgeon: Grace Isaac, MD;  Location: [place];  Service: Thoracic;  Laterality: Left;  . Lobectomy Left 12/15/2013    Procedure: LOBECTOMY;  Surgeon: Grace Isaac, MD;  Location: [place];  Service: Thoracic;  Laterality: Left;  . Renal angiogram Bilateral 11/08/2013    Procedure: RENAL ANGIOGRAM;  Surgeon: Lorretta Harp, MD;  Location: [facility] CATH LAB;  Service: Cardiovascular;  Laterality: Bilateral;  . Percutaneous stent intervention Right 11/08/2013    Procedure: PERCUTANEOUS STENT INTERVENTION;  Surgeon: Lorretta Harp, MD;  Location: [facility] CATH LAB;  Service: Cardiovascular;  Laterality: Right;  rt renal artery stent  .  Left heart catheterization with coronary angiogram N/A 12/13/2013    Procedure: LEFT HEART CATHETERIZATION WITH CORONARY ANGIOGRAM;  Surgeon: Burnell Blanks, MD;  Location: [facility] CATH LAB;  Service: Cardiovascular;  Laterality: N/A;    There were no vitals filed for this visit.      Subjective Assessment - 05/20/16 1600    Subjective Pt. reports she has continued to perform chair aerobics 2 x/wk.  No pain or complaints reported.     Patient Stated Goals "To be able to walk w/o my cane"   Currently in Pain? No/denies   Pain Score 0-No pain   Multiple Pain Sites No     Today's Treatment:  Therex: NuStep: level 7, 4 min  Supine B HS, glute, SKTC stretch x 30 sec  each Hooklying alternating hip abd/ER with blue TB x 10 reps each side  Hooklying abdom. bracing with alteranting LE march x 10 reps each side Hooklying B hip abd/ER with blue TB x 15 reps  B sidelying clam shell with blue TB x 10 reps  B standing single leg 4 way hip holding onto 2 poles with green TB x 10 reps each way Standing march holding onto chair x 10 reps each  Seated march x 10 reps each side  BATCA low row 10# x 15 reps        PT Short Term Goals - 05/16/16 1828    PT SHORT TERM GOAL #1   Title Independent with initial HEP by 06/07/16   Status On-going           PT Long Term Goals - 05/16/16 1828    PT LONG TERM GOAL #1   Title Independent with advanced HEP as indicated by 07/14/16   Status On-going   PT LONG TERM GOAL #2   Title B hip strength >/= 4/5 for improved gait stability by 07/14/16   Status On-going   PT LONG TERM GOAL #3   Title Pt will complete TUG in </= 13.5 sec to decrease risk for falls by 07/14/16   Status On-going   PT LONG TERM GOAL #4   Title Pt will demonstrate gait speed >/= 2.6 ft/sec to allow for improved safety with community ambulation by 07/14/16   Status On-going   PT LONG TERM GOAL #5   Title Pt will ambulate community distances w/o SPC w/o evidence of trendelenburg gait pattern by 07/14/16   Status On-going               Plan - 05/20/16 1510    Clinical Impression Statement Pt. reports she has continued to perform chair aerobics 2 x/wk.  No pain or complaints reported.  Pt. tolerated standing single leg 4-way hip strengthening with green TB well today without pain increase.  Pt. continues to be very compliant with therapy at this point.     PT Treatment/Interventions Patient/family education;Therapeutic exercise;Neuromuscular re-education;Gait training;Manual techniques;Taping;Electrical Stimulation;Moist Heat;Cryotherapy;Vasopneumatic Device;Therapeutic activities;ADLs/Self Care Home Management   PT Next Visit Plan proximal  LE/core strenthening activities; hip / knee strengthening activities.        Patient will benefit from skilled therapeutic intervention in order to improve the following deficits and impairments:  Pain, Decreased strength, Difficulty walking, Abnormal gait, Decreased balance, Decreased activity tolerance, Decreased endurance, Decreased range of motion  Visit Diagnosis: Difficulty in walking, not elsewhere classified  Other abnormalities of gait and mobility  Pain in left knee  Pain in right knee     Problem List Patient Active Problem List  Diagnosis Date Noted  . Bilateral knee pain 05/02/2016  . Trochanteric bursitis of left hip 01/11/2016  . Low back pain 10/02/2015  . Anxiety state 06/28/2015  . Fatigue 06/28/2015  . Shingles 11/24/2014  . Tremor of both hands 09/21/2014  . Gout due to renal impairment 08/30/2014  . Pedal edema 08/23/2014  . Physical deconditioning 06/23/2014  . Leukocytosis 06/13/2014  . UTI (lower urinary tract infection) 06/13/2014  . CKD (chronic kidney disease) stage 3, GFR 30-59 ml/min 05/27/2014  . IgA nephropathy 05/27/2014  . Chronic diastolic heart failure ([place]) 05/03/2014  . Dyspnea 04/23/2014  . Steroid-induced diabetes ([place]) 04/18/2014  . Acute bronchitis 04/18/2014  . Obesity (BMI 30-39.9) 04/12/2014  . Hematuria 02/25/2014  . Diastolic dysfunction- grade 2 02/16/2014  . DOE (dyspnea on exertion) 02/11/2014  . Leg edema 02/11/2014  . Edema of both legs 02/04/2014  . CAD (coronary artery disease), non obstructive by cardiac cath 12/12/13 01/24/2014  . SOB (shortness of breath) 01/18/2014  . CKD (chronic kidney disease) stage 4, GFR 15-29 ml/min ([facility]) 01/03/2014  . Lung cancer, left lower lobe 12/17/2013  . Status post lobectomy of lung 12/15/2013  . Chest pain with moderate risk of acute coronary syndrome 12/12/2013  . Family history of coronary artery bypass surgery 12/12/2013  . Renal artery atherosclerosis, s/p right RA stent  11/08/13 11/08/2013  . Lung nodule/ adenoca > LLobectomy 12/15/13  11/08/2013  . Premature atrial contractions 10/31/2013  . Hearing loss secondary to cerumen impaction 09/20/2013  . Allergic rhinitis 02/11/2013  . Shoulder pain 03/11/2012  . Upper arm pain 01/26/2012  . General medical examination 07/25/2011  . Obesity- BMI 41 05/10/2011  . Dizziness 04/10/2011  . Hyperlipidemia 02/28/2011  . EDEMA- LOCALIZED 06/04/2010  . OSTEOPENIA 01/15/2010  . KNEE PAIN 11/10/2009  . CARCINOMA, BASAL CELL 09/22/2009  . SKIN LESION 09/18/2009  . ESOPHAGITIS 05/19/2009  . GASTRIC ULCER 05/19/2009  . HIATAL HERNIA 05/19/2009  . DEGENERATIVE JOINT DISEASE 05/19/2009  . Hypothyroidism 03/23/2009  . Essential hypertension 03/23/2009  . ASTHMA 03/23/2009  . GERD 03/23/2009  . COLONIC POLYPS, HX OF 03/23/2009    Bess Harvest, PTA 05/21/2016, 2:45 PM  [facility] [address]  [place], [place], [address] Phone: [phone]   Fax:  [phone]  Name: Susan Pittman MRN: [id number] Date of Birth: Sep 16, 1941

## 2016-06-03 ENCOUNTER — Other Ambulatory Visit: Payer: Self-pay | Admitting: Nephrology

## 2016-06-03 DIAGNOSIS — I129 Hypertensive chronic kidney disease with stage 1 through stage 4 chronic kidney disease, or unspecified chronic kidney disease: Secondary | ICD-10-CM

## 2016-06-03 DIAGNOSIS — N184 Chronic kidney disease, stage 4 (severe): Principal | ICD-10-CM

## 2016-06-06 ENCOUNTER — Ambulatory Visit: Payer: Medicare Other | Attending: Family Medicine | Admitting: Physical Therapy

## 2016-06-06 DIAGNOSIS — R262 Difficulty in walking, not elsewhere classified: Secondary | ICD-10-CM | POA: Diagnosis not present

## 2016-06-06 DIAGNOSIS — M25561 Pain in right knee: Secondary | ICD-10-CM | POA: Diagnosis not present

## 2016-06-06 DIAGNOSIS — R2689 Other abnormalities of gait and mobility: Secondary | ICD-10-CM | POA: Insufficient documentation

## 2016-06-06 DIAGNOSIS — M25562 Pain in left knee: Secondary | ICD-10-CM | POA: Diagnosis not present

## 2016-06-06 NOTE — Therapy (Signed)
[facility] [address]  [place], [place], [address] Phone: [phone]   Fax:  [phone]  Physical Therapy Treatment  Patient Details  Name: Susan Pittman MRN: [id number] Date of Birth: [birth year] Referring Provider: Dene Gentry, MD  Encounter Date: 06/06/2016      PT End of Session - 06/06/16 1447    Visit Number 4   Number of Visits 12   Date for PT Re-Evaluation 07/14/16   PT Start Time 5726   PT Stop Time 1526   PT Time Calculation (min) 39 min   Activity Tolerance Patient tolerated treatment well   Behavior During Therapy [facility] for tasks assessed/performed      Past Medical History:  Diagnosis Date  . Allergy   . Arthritis    "knees, hips, back, hands" (11/08/2013)  . Asthma   . Basal cell carcinoma    "cut them off face & right shoulder" (11/08/2013)  . CAD (coronary artery disease), non obstructive by cardiac cath 12/12/13 01/24/2014  . CKD (chronic kidney disease), stage III   . Diastolic CHF ([place])   . DJD (degenerative joint disease)   . Gastric ulcer   . GERD (gastroesophageal reflux disease)   . Hiatal hernia   . Hyperlipidemia    "don't take RX for it" (11/08/2013)  . Hypertension   . Hypothyroid   . Lung mass    superior segment LLL/notes 11/08/2013  . Lung nodule/ adenoca > LLobectomy 12/15/13  11/08/2013   PET 11/23/2013 1. Dominant sub solid left lower lobe nodule does not demonstrate significantly increased metabolic activity. However, morphologically, adenocarcinoma is still a concern.   - Spirometry 11/24/13 wnl  - 11/24/2013 T surg eval rec> LLower lobectomy 12/15/2013 for adenoca   . Nephropathy   . Osteoporosis   . Pneumonia    "I've had it ?3 times" (11/08/2013)  . Renal artery stenosis ([place])    with stent placement    Past Surgical History:  Procedure Laterality Date  . ANTERIOR CERVICAL DECOMP/DISCECTOMY FUSION  2000   C5-C6  . CARDIAC CATHETERIZATION     Hx: of 2/ 2015  .  CHOLECYSTECTOMY  1970's  . CORONARY ANGIOGRAM     Hx: of 2/ 2015  . DILATION AND CURETTAGE OF UTERUS  1960's  . KNEE ARTHROSCOPY Bilateral   . LEFT HEART CATHETERIZATION WITH CORONARY ANGIOGRAM N/A 12/13/2013   Procedure: LEFT HEART CATHETERIZATION WITH CORONARY ANGIOGRAM;  Surgeon: Burnell Blanks, MD;  Location: [facility] CATH LAB;  Service: Cardiovascular;  Laterality: N/A;  . LOBECTOMY Left 12/15/2013   Procedure: LOBECTOMY;  Surgeon: Grace Isaac, MD;  Location: [place];  Service: Thoracic;  Laterality: Left;  . PERCUTANEOUS STENT INTERVENTION Right 11/08/2013   Procedure: PERCUTANEOUS STENT INTERVENTION;  Surgeon: Lorretta Harp, MD;  Location: [facility] CATH LAB;  Service: Cardiovascular;  Laterality: Right;  rt renal artery stent  . RENAL ANGIOGRAM Bilateral 11/08/2013   Procedure: RENAL ANGIOGRAM;  Surgeon: Lorretta Harp, MD;  Location: [facility] CATH LAB;  Service: Cardiovascular;  Laterality: Bilateral;  . RENAL ARTERY STENT Right 11/08/2013   Archie Endo 11/08/2013  . SKIN CANCER EXCISION    . TONSILLECTOMY AND ADENOIDECTOMY  ?1946  . VAGINAL HYSTERECTOMY  1970  . VIDEO ASSISTED THORACOSCOPY (VATS)/WEDGE RESECTION Left 12/15/2013   Procedure: VIDEO ASSISTED THORACOSCOPY (VATS)/WEDGE RESECTION;  Surgeon: Grace Isaac, MD;  Location: [place];  Service: Thoracic;  Laterality: Left;  Marland Kitchen VIDEO BRONCHOSCOPY N/A 12/15/2013   Procedure:  VIDEO BRONCHOSCOPY;  Surgeon: Grace Isaac, MD;  Location: [facility] OR;  Service: Thoracic;  Laterality: N/A;    There were no vitals filed for this visit.      Subjective Assessment - 06/06/16 1447    Subjective Pt reports she has been on vacation for the past few weeks and did well. Able to complete the HEP the first week, but was too fatigued last week from traveling to perform HEP.   Patient Stated Goals "To be able to walk w/o my cane"   Currently in Pain? No/denies           Today's Treatment  TherEx NuStep - level 7 x 5  min  Manual Supine B HS, glute,  SKTC & KTOS stretches x30" each B Supine KTOS 2x30" B Standing 4 way SLR with red TB x10, 2 pole A Countertop minisquat x10 B Sidestepping along counter with light UE support x4 (uncompensated trendelenburg noted with SLS on L when stepping to R)          PT Education - 06/06/16 1530    Education provided Yes   Education Details Standing exercises + piriformis stretches; Pt instructed to alternate days on prior HEP and new exercises   Person(s) Educated Patient   Methods Explanation;Demonstration;Handout   Comprehension Verbalized understanding;Returned demonstration;Need further instruction          PT Short Term Goals - 06/06/16 1526      PT SHORT TERM GOAL #1   Title Independent with initial HEP by 06/07/16   Status Achieved           PT Long Term Goals - 06/06/16 1527      PT LONG TERM GOAL #1   Title Independent with advanced HEP as indicated by 07/14/16   Status On-going     PT LONG TERM GOAL #2   Title B hip strength >/= 4/5 for improved gait stability by 07/14/16   Status On-going     PT LONG TERM GOAL #3   Title Pt will complete TUG in </= 13.5 sec to decrease risk for falls by 07/14/16   Status On-going     PT LONG TERM GOAL #4   Title Pt will demonstrate gait speed >/= 2.6 ft/sec to allow for improved safety with community ambulation by 07/14/16   Status On-going     PT LONG TERM GOAL #5   Title Pt will ambulate community distances w/o SPC w/o evidence of trendelenburg gait pattern by 07/14/16   Status On-going               Plan - 06/06/16 1527    Clinical Impression Statement Pt returning after ~2 week absence due to vacation/traveling. Reported good compliance with HEP for 1st week of vacation with no issues or concerns, but was too fatigued from traveling last week to complete HEP.  Continued progression toward more standing exercises/activities today to continue strengthening while incorporating balance training. Pt struggling for proper form  when performing 4 way SLR with green TB with significant trendelenberg R hip drop noted with L SLS, therefore reduced resistance to red TB and provided instructions for performance w/o TB resistance at home. Pt's overall flexibility looks good other than continued tightness noted in B piriformis, therefore piriformis stretching also added to HEP.   PT Treatment/Interventions Patient/family education;Therapeutic exercise;Neuromuscular re-education;Gait training;Manual techniques;Taping;Electrical Stimulation;Moist Heat;Cryotherapy;Vasopneumatic Device;Therapeutic activities;ADLs/Self Care Home Management   PT Next Visit Plan Core/hip/knee strenthening; Piriformis stretching; Balance training with progression to dynamic gait activities  as strength allows   Consulted and Agree with Plan of Care Patient      Patient will benefit from skilled therapeutic intervention in order to improve the following deficits and impairments:  Pain, Decreased strength, Difficulty walking, Abnormal gait, Decreased balance, Decreased activity tolerance, Decreased endurance, Decreased range of motion  Visit Diagnosis: Difficulty in walking, not elsewhere classified  Other abnormalities of gait and mobility  Pain in left knee  Pain in right knee     Problem List Patient Active Problem List   Diagnosis Date Noted  . Bilateral knee pain 05/02/2016  . Trochanteric bursitis of left hip 01/11/2016  . Low back pain 10/02/2015  . Anxiety state 06/28/2015  . Fatigue 06/28/2015  . Shingles 11/24/2014  . Tremor of both hands 09/21/2014  . Gout due to renal impairment 08/30/2014  . Pedal edema 08/23/2014  . Physical deconditioning 06/23/2014  . Leukocytosis 06/13/2014  . UTI (lower urinary tract infection) 06/13/2014  . CKD (chronic kidney disease) stage 3, GFR 30-59 ml/min 05/27/2014  . IgA nephropathy 05/27/2014  . Chronic diastolic heart failure ([place]) 05/03/2014  . Dyspnea 04/23/2014  . Steroid-induced  diabetes ([place]) 04/18/2014  . Acute bronchitis 04/18/2014  . Obesity (BMI 30-39.9) 04/12/2014  . Hematuria 02/25/2014  . Diastolic dysfunction- grade 2 02/16/2014  . DOE (dyspnea on exertion) 02/11/2014  . Leg edema 02/11/2014  . Edema of both legs 02/04/2014  . CAD (coronary artery disease), non obstructive by cardiac cath 12/12/13 01/24/2014  . SOB (shortness of breath) 01/18/2014  . CKD (chronic kidney disease) stage 4, GFR 15-29 ml/min (HCC) 01/03/2014  . Lung cancer, left lower lobe 12/17/2013  . Status post lobectomy of lung 12/15/2013  . Chest pain with moderate risk of acute coronary syndrome 12/12/2013  . Family history of coronary artery bypass surgery 12/12/2013  . Renal artery atherosclerosis, s/p right RA stent 11/08/13 11/08/2013  . Lung nodule/ adenoca > LLobectomy 12/15/13  11/08/2013  . Premature atrial contractions 10/31/2013  . Hearing loss secondary to cerumen impaction 09/20/2013  . Allergic rhinitis 02/11/2013  . Shoulder pain 03/11/2012  . Upper arm pain 01/26/2012  . General medical examination 07/25/2011  . Obesity- BMI 41 05/10/2011  . Dizziness 04/10/2011  . Hyperlipidemia 02/28/2011  . EDEMA- LOCALIZED 06/04/2010  . OSTEOPENIA 01/15/2010  . KNEE PAIN 11/10/2009  . CARCINOMA, BASAL CELL 09/22/2009  . SKIN LESION 09/18/2009  . ESOPHAGITIS 05/19/2009  . GASTRIC ULCER 05/19/2009  . HIATAL HERNIA 05/19/2009  . DEGENERATIVE JOINT DISEASE 05/19/2009  . Hypothyroidism 03/23/2009  . Essential hypertension 03/23/2009  . ASTHMA 03/23/2009  . GERD 03/23/2009  . COLONIC POLYPS, HX OF 03/23/2009    Percival Spanish, PT, MPT 06/06/2016, 6:53 PM  [facility] [address]  [place], [place], [address] Phone: [phone]   Fax:  [phone]  Name: Susan Pittman MRN: [id number] Date of Birth: December 23, 1940

## 2016-06-10 ENCOUNTER — Ambulatory Visit: Payer: Medicare Other

## 2016-06-10 DIAGNOSIS — R262 Difficulty in walking, not elsewhere classified: Secondary | ICD-10-CM | POA: Diagnosis not present

## 2016-06-10 DIAGNOSIS — R2689 Other abnormalities of gait and mobility: Secondary | ICD-10-CM | POA: Diagnosis not present

## 2016-06-10 DIAGNOSIS — M25561 Pain in right knee: Secondary | ICD-10-CM

## 2016-06-10 DIAGNOSIS — M25562 Pain in left knee: Secondary | ICD-10-CM

## 2016-06-10 NOTE — Therapy (Signed)
[facility] [address]  [place], [place], [address] Phone: [phone]   Fax:  [phone]  Physical Therapy Treatment  Patient Details  Name: BLAKELYNN SCHEELER MRN: [id number] Date of Birth: [birth year] Referring Provider: Dene Gentry, MD  Encounter Date: 06/10/2016      PT End of Session - 06/10/16 1500    Visit Number 5   Number of Visits 12   Date for PT Re-Evaluation 07/14/16   PT Start Time 6160   PT Stop Time 1530   PT Time Calculation (min) 41 min   Activity Tolerance Patient tolerated treatment well   Behavior During Therapy [facility] for tasks assessed/performed      Past Medical History:  Diagnosis Date  . Allergy   . Arthritis    "knees, hips, back, hands" (11/08/2013)  . Asthma   . Basal cell carcinoma    "cut them off face & right shoulder" (11/08/2013)  . CAD (coronary artery disease), non obstructive by cardiac cath 12/12/13 01/24/2014  . CKD (chronic kidney disease), stage III   . Diastolic CHF ([place])   . DJD (degenerative joint disease)   . Gastric ulcer   . GERD (gastroesophageal reflux disease)   . Hiatal hernia   . Hyperlipidemia    "don't take RX for it" (11/08/2013)  . Hypertension   . Hypothyroid   . Lung mass    superior segment LLL/notes 11/08/2013  . Lung nodule/ adenoca > LLobectomy 12/15/13  11/08/2013   PET 11/23/2013 1. Dominant sub solid left lower lobe nodule does not demonstrate significantly increased metabolic activity. However, morphologically, adenocarcinoma is still a concern.   - Spirometry 11/24/13 wnl  - 11/24/2013 T surg eval rec> LLower lobectomy 12/15/2013 for adenoca   . Nephropathy   . Osteoporosis   . Pneumonia    "I've had it ?3 times" (11/08/2013)  . Renal artery stenosis ([place])    with stent placement    Past Surgical History:  Procedure Laterality Date  . ANTERIOR CERVICAL DECOMP/DISCECTOMY FUSION  2000   C5-C6  . CARDIAC CATHETERIZATION     Hx: of 2/ 2015  .  CHOLECYSTECTOMY  1970's  . CORONARY ANGIOGRAM     Hx: of 2/ 2015  . DILATION AND CURETTAGE OF UTERUS  1960's  . KNEE ARTHROSCOPY Bilateral   . LEFT HEART CATHETERIZATION WITH CORONARY ANGIOGRAM N/A 12/13/2013   Procedure: LEFT HEART CATHETERIZATION WITH CORONARY ANGIOGRAM;  Surgeon: Burnell Blanks, MD;  Location: [facility] C[facility] LAB;  Service: Cardiovascular;  Laterality: N/A;  . LOBECTOMY Left 12/15/2013   Procedure: LOBECTOMY;  Surgeon: Grace Isaac, MD;  Location: [place];  Service: Thoracic;  Laterality: Left;  . PERCUTANEOUS STENT INTERVENTION Right 11/08/2013   Procedure: PERCUTANEOUS STENT INTERVENTION;  Surgeon: Lorretta Harp, MD;  Location: [facility] CATH LAB;  Service: Cardiovascular;  Laterality: Right;  rt renal artery stent  . RENAL ANGIOGRAM Bilateral 11/08/2013   Procedure: RENAL ANGIOGRAM;  Surgeon: Lorretta Harp, MD;  Location: [facility] CATH LAB;  Service: Cardiovascular;  Laterality: Bilateral;  . RENAL ARTERY STENT Right 11/08/2013   Archie Endo 11/08/2013  . SKIN CANCER EXCISION    . TONSILLECTOMY AND ADENOIDECTOMY  ?1946  . VAGINAL HYSTERECTOMY  1970  . VIDEO ASSISTED THORACOSCOPY (VATS)/WEDGE RESECTION Left 12/15/2013   Procedure: VIDEO ASSISTED THORACOSCOPY (VATS)/WEDGE RESECTION;  Surgeon: Grace Isaac, MD;  Location: [place];  Service: Thoracic;  Laterality: Left;  Marland Kitchen VIDEO BRONCHOSCOPY N/A 12/15/2013   Procedure:  VIDEO BRONCHOSCOPY;  Surgeon: Grace Isaac, MD;  Location: [facility] OR;  Service: Thoracic;  Laterality: N/A;    There were no vitals filed for this visit.      Subjective Assessment - 06/10/16 1740    Subjective Pt. reports she is back performing the HEP and is only feeling mild irritation at the knees today no actual pain.     Currently in Pain? No/denies   Pain Score 0-No pain   Multiple Pain Sites No        Today's Treatment  TherEx NuStep - level 5 x 5  min  Manual Supine B HS, glute, SKTC & KTOS stretches x30" each Bridge x 10 reps  L hip abduction 3  x  10 reps  TRX squat x 12 reps  Counter squat x 10 reps  B heel raise at UBE x 15 reps  6" lateral step up holding onto treadmill x 5 reps each way  Neuro Re-ed: Alternating side stepping onto blue airex pad x 7 reps each way; next to table with 2 ski pole assist  Alternating toe-touch onto 6" step standing on blue airex pad; 2 pole assist  Alternating toe-touch onto 6" step standing on blue airex pad; 1 pole support          PT Short Term Goals - 06/06/16 1526      PT SHORT TERM GOAL #1   Title Independent with initial HEP by 06/07/16   Status Achieved           PT Long Term Goals - 06/06/16 1527      PT LONG TERM GOAL #1   Title Independent with advanced HEP as indicated by 07/14/16   Status On-going     PT LONG TERM GOAL #2   Title B hip strength >/= 4/5 for improved gait stability by 07/14/16   Status On-going     PT LONG TERM GOAL #3   Title Pt will complete TUG in </= 13.5 sec to decrease risk for falls by 07/14/16   Status On-going     PT LONG TERM GOAL #4   Title Pt will demonstrate gait speed >/= 2.6 ft/sec to allow for improved safety with community ambulation by 07/14/16   Status On-going     PT LONG TERM GOAL #5   Title Pt will ambulate community distances w/o SPC w/o evidence of trendelenburg gait pattern by 07/14/16   Status On-going               Plan - 06/10/16 1743    Clinical Impression Statement Today's treatment focused on stepping activity on compliant surface to improve LE clearance and balance.  Pt. performed very well with only mild LOB and able to self correct.  Pt. reported only mild fatigue pain free at B knees following therex today.     PT Treatment/Interventions Patient/family education;Therapeutic exercise;Neuromuscular re-education;Gait training;Manual techniques;Taping;Electrical Stimulation;Moist Heat;Cryotherapy;Vasopneumatic Device;Therapeutic activities;ADLs/Self Care Home Management   PT Next Visit Plan Core/hip/knee  strenthening; Piriformis stretching; Balance training with progression to dynamic gait activities as strength allows      Patient will benefit from skilled therapeutic intervention in order to improve the following deficits and impairments:  Pain, Decreased strength, Difficulty walking, Abnormal gait, Decreased balance, Decreased activity tolerance, Decreased endurance, Decreased range of motion  Visit Diagnosis: Difficulty in walking, not elsewhere classified  Other abnormalities of gait and mobility  Pain in left knee  Pain in right knee     Problem List Patient Active  Problem List   Diagnosis Date Noted  . Bilateral knee pain 05/02/2016  . Trochanteric bursitis of left hip 01/11/2016  . Low back pain 10/02/2015  . Anxiety state 06/28/2015  . Fatigue 06/28/2015  . Shingles 11/24/2014  . Tremor of both hands 09/21/2014  . Gout due to renal impairment 08/30/2014  . Pedal edema 08/23/2014  . Physical deconditioning 06/23/2014  . Leukocytosis 06/13/2014  . UTI (lower urinary tract infection) 06/13/2014  . CKD (chronic kidney disease) stage 3, GFR 30-59 ml/min 05/27/2014  . IgA nephropathy 05/27/2014  . Chronic diastolic heart failure ([place]) 05/03/2014  . Dyspnea 04/23/2014  . Steroid-induced diabetes ([place]) 04/18/2014  . Acute bronchitis 04/18/2014  . Obesity (BMI 30-39.9) 04/12/2014  . Hematuria 02/25/2014  . Diastolic dysfunction- grade 2 02/16/2014  . DOE (dyspnea on exertion) 02/11/2014  . Leg edema 02/11/2014  . Edema of both legs 02/04/2014  . CAD (coronary artery disease), non obstructive by cardiac cath 12/12/13 01/24/2014  . SOB (shortness of breath) 01/18/2014  . CKD (chronic kidney disease) stage 4, GFR 15-29 ml/min ([facility]) 01/03/2014  . Lung cancer, left lower lobe 12/17/2013  . Status post lobectomy of lung 12/15/2013  . Chest pain with moderate risk of acute coronary syndrome 12/12/2013  . Family history of coronary artery bypass surgery 12/12/2013  . Renal  artery atherosclerosis, s/p right RA stent 11/08/13 11/08/2013  . Lung nodule/ adenoca > LLobectomy 12/15/13  11/08/2013  . Premature atrial contractions 10/31/2013  . Hearing loss secondary to cerumen impaction 09/20/2013  . Allergic rhinitis 02/11/2013  . Shoulder pain 03/11/2012  . Upper arm pain 01/26/2012  . General medical examination 07/25/2011  . Obesity- BMI 41 05/10/2011  . Dizziness 04/10/2011  . Hyperlipidemia 02/28/2011  . EDEMA- LOCALIZED 06/04/2010  . OSTEOPENIA 01/15/2010  . KNEE PAIN 11/10/2009  . CARCINOMA, BASAL CELL 09/22/2009  . SKIN LESION 09/18/2009  . ESOPHAGITIS 05/19/2009  . GASTRIC ULCER 05/19/2009  . HIATAL HERNIA 05/19/2009  . DEGENERATIVE JOINT DISEASE 05/19/2009  . Hypothyroidism 03/23/2009  . Essential hypertension 03/23/2009  . ASTHMA 03/23/2009  . GERD 03/23/2009  . COLONIC POLYPS, HX OF 03/23/2009    Bess Harvest, PTA 06/10/2016, 5:46 PM  [facility] [address]  [place], [place], [address] Phone: [phone]   Fax:  [phone]  Name: DASHAYLA THEISSEN MRN: [id number] Date of Birth: Nov 27, 1940

## 2016-06-12 ENCOUNTER — Ambulatory Visit: Payer: Medicare Other

## 2016-06-12 DIAGNOSIS — M25561 Pain in right knee: Secondary | ICD-10-CM

## 2016-06-12 DIAGNOSIS — R262 Difficulty in walking, not elsewhere classified: Secondary | ICD-10-CM

## 2016-06-12 DIAGNOSIS — M25562 Pain in left knee: Secondary | ICD-10-CM | POA: Diagnosis not present

## 2016-06-12 DIAGNOSIS — R2689 Other abnormalities of gait and mobility: Secondary | ICD-10-CM | POA: Diagnosis not present

## 2016-06-12 NOTE — Therapy (Signed)
[facility] [address]  [place], [place], [address] Phone: [phone]   Fax:  [phone]  Physical Therapy Treatment  Patient Details  Name: Susan Pittman MRN: [id number] Date of Birth: [birth year] Referring Provider: Dene Gentry, MD  Encounter Date: 06/12/2016      PT End of Session - 06/12/16 1451    Visit Number 6   Number of Visits 12   Date for PT Re-Evaluation 07/14/16   PT Start Time 6962   PT Stop Time 1527   PT Time Calculation (min) 40 min   Activity Tolerance Patient tolerated treatment well   Behavior During Therapy [facility] for tasks assessed/performed      Past Medical History:  Diagnosis Date  . Allergy   . Arthritis    "knees, hips, back, hands" (11/08/2013)  . Asthma   . Basal cell carcinoma    "cut them off face & right shoulder" (11/08/2013)  . CAD (coronary artery disease), non obstructive by cardiac cath 12/12/13 01/24/2014  . CKD (chronic kidney disease), stage III   . Diastolic CHF ([place])   . DJD (degenerative joint disease)   . Gastric ulcer   . GERD (gastroesophageal reflux disease)   . Hiatal hernia   . Hyperlipidemia    "don't take RX for it" (11/08/2013)  . Hypertension   . Hypothyroid   . Lung mass    superior segment LLL/notes 11/08/2013  . Lung nodule/ adenoca > LLobectomy 12/15/13  11/08/2013   PET 11/23/2013 1. Dominant sub solid left lower lobe nodule does not demonstrate significantly increased metabolic activity. However, morphologically, adenocarcinoma is still a concern.   - Spirometry 11/24/13 wnl  - 11/24/2013 T surg eval rec> LLower lobectomy 12/15/2013 for adenoca   . Nephropathy   . Osteoporosis   . Pneumonia    "I've had it ?3 times" (11/08/2013)  . Renal artery stenosis ([place])    with stent placement    Past Surgical History:  Procedure Laterality Date  . ANTERIOR CERVICAL DECOMP/DISCECTOMY FUSION  2000   C5-C6  . CARDIAC CATHETERIZATION     Hx: of 2/ 2015  .  CHOLECYSTECTOMY  1970's  . CORONARY ANGIOGRAM     Hx: of 2/ 2015  . DILATION AND CURETTAGE OF UTERUS  1960's  . KNEE ARTHROSCOPY Bilateral   . LEFT HEART CATHETERIZATION WITH CORONARY ANGIOGRAM N/A 12/13/2013   Procedure: LEFT HEART CATHETERIZATION WITH CORONARY ANGIOGRAM;  Surgeon: Burnell Blanks, MD;  Location: [facility] CATH LAB;  Service: Cardiovascular;  Laterality: N/A;  . LOBECTOMY Left 12/15/2013   Procedure: LOBECTOMY;  Surgeon: Grace Isaac, MD;  Location: [place];  Service: Thoracic;  Laterality: Left;  . PERCUTANEOUS STENT INTERVENTION Right 11/08/2013   Procedure: PERCUTANEOUS STENT INTERVENTION;  Surgeon: Lorretta Harp, MD;  Location: [facility] CATH LAB;  Service: Cardiovascular;  Laterality: Right;  rt renal artery stent  . RENAL ANGIOGRAM Bilateral 11/08/2013   Procedure: RENAL ANGIOGRAM;  Surgeon: Lorretta Harp, MD;  Location: [facility] CATH LAB;  Service: Cardiovascular;  Laterality: Bilateral;  . RENAL ARTERY STENT Right 11/08/2013   Archie Endo 11/08/2013  . SKIN CANCER EXCISION    . TONSILLECTOMY AND ADENOIDECTOMY  ?1946  . VAGINAL HYSTERECTOMY  1970  . VIDEO ASSISTED THORACOSCOPY (VATS)/WEDGE RESECTION Left 12/15/2013   Procedure: VIDEO ASSISTED THORACOSCOPY (VATS)/WEDGE RESECTION;  Surgeon: Grace Isaac, MD;  Location: [place];  Service: Thoracic;  Laterality: Left;  Marland Kitchen VIDEO BRONCHOSCOPY N/A 12/15/2013   Procedure:  VIDEO BRONCHOSCOPY;  Surgeon: Grace Isaac, MD;  Location: [facility] OR;  Service: Thoracic;  Laterality: N/A;    There were no vitals filed for this visit.      Subjective Assessment - 06/12/16 1454    Subjective Pt. pain free today initially with only B knee stiffness.  Pt. reports she was able to perform chair aerobics and all HEP activities yesterday.     Patient Stated Goals "To be able to walk w/o my cane"   Currently in Pain? No/denies   Pain Score 0-No pain   Multiple Pain Sites No      Today's Treatment:  TherEx: NuStep -level 7 x 5 min  L HS, glute,  piriformis stretch x 30 sec each Bridge x 15 reps  Hooklying bridge with alternating hip abd/ER with blue TB x 10 reps each side  B sidelying clam shell with blue TB x 10 reps  L hip abduction 3  x 10 reps  B Sidestepping along counter with light UE support x 5 (uncompensated trendelenburg noted with SLS on L when stepping to R)  L SLS hip hike on blue airex pad holding onto counter 2 x 10 reps Side stepping with yellow TB around ankles next to counter x 2 laps down/back         PT Short Term Goals - 06/06/16 1526      PT SHORT TERM GOAL #1   Title Independent with initial HEP by 06/07/16   Status Achieved           PT Long Term Goals - 06/06/16 1527      PT LONG TERM GOAL #1   Title Independent with advanced HEP as indicated by 07/14/16   Status On-going     PT LONG TERM GOAL #2   Title B hip strength >/= 4/5 for improved gait stability by 07/14/16   Status On-going     PT LONG TERM GOAL #3   Title Pt will complete TUG in </= 13.5 sec to decrease risk for falls by 07/14/16   Status On-going     PT LONG TERM GOAL #4   Title Pt will demonstrate gait speed >/= 2.6 ft/sec to allow for improved safety with community ambulation by 07/14/16   Status On-going     PT LONG TERM GOAL #5   Title Pt will ambulate community distances w/o SPC w/o evidence of trendelenburg gait pattern by 07/14/16   Status On-going               Plan - 06/12/16 1451    Clinical Impression Statement Pt. pain free today initially with only B knee stiffness.  Pt. reports she was able to perform chair aerobics yesterday and all HEP activities.  Today's treatment focused on standing stepping activity with pt. tolerating well; pt. continues with Trendelenburg gait on L; L hip abduction strengthening was the focus of todays standing activities.  Pt. progressing well at this point.     PT Treatment/Interventions Patient/family education;Therapeutic exercise;Neuromuscular re-education;Gait training;Manual  techniques;Taping;Electrical Stimulation;Moist Heat;Cryotherapy;Vasopneumatic Device;Therapeutic activities;ADLs/Self Care Home Management   PT Next Visit Plan Core/hip/knee strenthening; Piriformis stretching; Balance training with progression to dynamic gait activities as strength allows      Patient will benefit from skilled therapeutic intervention in order to improve the following deficits and impairments:  Pain, Decreased strength, Difficulty walking, Abnormal gait, Decreased balance, Decreased activity tolerance, Decreased endurance, Decreased range of motion  Visit Diagnosis: Difficulty in walking, not elsewhere classified  Other abnormalities  of gait and mobility  Pain in left knee  Pain in right knee     Problem List Patient Active Problem List   Diagnosis Date Noted  . Bilateral knee pain 05/02/2016  . Trochanteric bursitis of left hip 01/11/2016  . Low back pain 10/02/2015  . Anxiety state 06/28/2015  . Fatigue 06/28/2015  . Shingles 11/24/2014  . Tremor of both hands 09/21/2014  . Gout due to renal impairment 08/30/2014  . Pedal edema 08/23/2014  . Physical deconditioning 06/23/2014  . Leukocytosis 06/13/2014  . UTI (lower urinary tract infection) 06/13/2014  . CKD (chronic kidney disease) stage 3, GFR 30-59 ml/min 05/27/2014  . IgA nephropathy 05/27/2014  . Chronic diastolic heart failure ([place]) 05/03/2014  . Dyspnea 04/23/2014  . Steroid-induced diabetes ([place]) 04/18/2014  . Acute bronchitis 04/18/2014  . Obesity (BMI 30-39.9) 04/12/2014  . Hematuria 02/25/2014  . Diastolic dysfunction- grade 2 02/16/2014  . DOE (dyspnea on exertion) 02/11/2014  . Leg edema 02/11/2014  . Edema of both legs 02/04/2014  . CAD (coronary artery disease), non obstructive by cardiac cath 12/12/13 01/24/2014  . SOB (shortness of breath) 01/18/2014  . CKD (chronic kidney disease) stage 4, GFR 15-29 ml/min (HCC) 01/03/2014  . Lung cancer, left lower lobe 12/17/2013  . Status post  lobectomy of lung 12/15/2013  . Chest pain with moderate risk of acute coronary syndrome 12/12/2013  . Family history of coronary artery bypass surgery 12/12/2013  . Renal artery atherosclerosis, s/p right RA stent 11/08/13 11/08/2013  . Lung nodule/ adenoca > LLobectomy 12/15/13  11/08/2013  . Premature atrial contractions 10/31/2013  . Hearing loss secondary to cerumen impaction 09/20/2013  . Allergic rhinitis 02/11/2013  . Shoulder pain 03/11/2012  . Upper arm pain 01/26/2012  . General medical examination 07/25/2011  . Obesity- BMI 41 05/10/2011  . Dizziness 04/10/2011  . Hyperlipidemia 02/28/2011  . EDEMA- LOCALIZED 06/04/2010  . OSTEOPENIA 01/15/2010  . KNEE PAIN 11/10/2009  . CARCINOMA, BASAL CELL 09/22/2009  . SKIN LESION 09/18/2009  . ESOPHAGITIS 05/19/2009  . GASTRIC ULCER 05/19/2009  . HIATAL HERNIA 05/19/2009  . DEGENERATIVE JOINT DISEASE 05/19/2009  . Hypothyroidism 03/23/2009  . Essential hypertension 03/23/2009  . ASTHMA 03/23/2009  . GERD 03/23/2009  . COLONIC POLYPS, HX OF 03/23/2009    Bess Harvest, PTA 06/12/2016, 6:11 PM  [facility] [address]  [place], [place], [address] Phone: [phone]   Fax:  [phone]  Name: SANDARA TYREE MRN: [id number] Date of Birth: [birth year]

## 2016-06-17 ENCOUNTER — Ambulatory Visit: Payer: Medicare Other

## 2016-06-17 DIAGNOSIS — M25561 Pain in right knee: Secondary | ICD-10-CM

## 2016-06-17 DIAGNOSIS — M25562 Pain in left knee: Secondary | ICD-10-CM

## 2016-06-17 DIAGNOSIS — R2689 Other abnormalities of gait and mobility: Secondary | ICD-10-CM

## 2016-06-17 DIAGNOSIS — R262 Difficulty in walking, not elsewhere classified: Secondary | ICD-10-CM

## 2016-06-17 NOTE — Therapy (Signed)
[facility] [address]  [place], [place], [address] Phone: [phone]   Fax:  [phone]  Physical Therapy Treatment  Patient Details  Name: Susan Pittman MRN: [id number] Date of Birth: 06-19-1941 Referring Provider: Dene Gentry, MD  Encounter Date: 06/17/2016      PT End of Session - 06/17/16 1528    Visit Number 7   Number of Visits 12   Date for PT Re-Evaluation 07/14/16   PT Start Time 8242   PT Stop Time 1530   PT Time Calculation (min) 39 min   Activity Tolerance Patient tolerated treatment well   Behavior During Therapy [facility] for tasks assessed/performed      Past Medical History:  Diagnosis Date  . Allergy   . Arthritis    "knees, hips, back, hands" (11/08/2013)  . Asthma   . Basal cell carcinoma    "cut them off face & right shoulder" (11/08/2013)  . CAD (coronary artery disease), non obstructive by cardiac cath 12/12/13 01/24/2014  . CKD (chronic kidney disease), stage III   . Diastolic CHF ([place])   . DJD (degenerative joint disease)   . Gastric ulcer   . GERD (gastroesophageal reflux disease)   . Hiatal hernia   . Hyperlipidemia    "don't take RX for it" (11/08/2013)  . Hypertension   . Hypothyroid   . Lung mass    superior segment LLL/notes 11/08/2013  . Lung nodule/ adenoca > LLobectomy 12/15/13  11/08/2013   PET 11/23/2013 1. Dominant sub solid left lower lobe nodule does not demonstrate significantly increased metabolic activity. However, morphologically, adenocarcinoma is still a concern.   - Spirometry 11/24/13 wnl  - 11/24/2013 T surg eval rec> LLower lobectomy 12/15/2013 for adenoca   . Nephropathy   . Osteoporosis   . Pneumonia    "I've had it ?3 times" (11/08/2013)  . Renal artery stenosis ([place])    with stent placement    Past Surgical History:  Procedure Laterality Date  . ANTERIOR CERVICAL DECOMP/DISCECTOMY FUSION  2000   C5-C6  . CARDIAC CATHETERIZATION     Hx: of 2/ 2015  .  CHOLECYSTECTOMY  1970's  . CORONARY ANGIOGRAM     Hx: of 2/ 2015  . DILATION AND CURETTAGE OF UTERUS  1960's  . KNEE ARTHROSCOPY Bilateral   . LEFT HEART CATHETERIZATION WITH CORONARY ANGIOGRAM N/A 12/13/2013   Procedure: LEFT HEART CATHETERIZATION WITH CORONARY ANGIOGRAM;  Surgeon: Burnell Blanks, MD;  Location: [facility] CATH LAB;  Service: Cardiovascular;  Laterality: N/A;  . LOBECTOMY Left 12/15/2013   Procedure: LOBECTOMY;  Surgeon: Grace Isaac, MD;  Location: [place];  Service: Thoracic;  Laterality: Left;  . PERCUTANEOUS STENT INTERVENTION Right 11/08/2013   Procedure: PERCUTANEOUS STENT INTERVENTION;  Surgeon: Lorretta Harp, MD;  Location: [facility] CATH LAB;  Service: Cardiovascular;  Laterality: Right;  rt renal artery stent  . RENAL ANGIOGRAM Bilateral 11/08/2013   Procedure: RENAL ANGIOGRAM;  Surgeon: Lorretta Harp, MD;  Location: [facility] CATH LAB;  Service: Cardiovascular;  Laterality: Bilateral;  . RENAL ARTERY STENT Right 11/08/2013   Archie Endo 11/08/2013  . SKIN CANCER EXCISION    . TONSILLECTOMY AND ADENOIDECTOMY  ?1946  . VAGINAL HYSTERECTOMY  1970  . VIDEO ASSISTED THORACOSCOPY (VATS)/WEDGE RESECTION Left 12/15/2013   Procedure: VIDEO ASSISTED THORACOSCOPY (VATS)/WEDGE RESECTION;  Surgeon: Grace Isaac, MD;  Location: [place];  Service: Thoracic;  Laterality: Left;  Marland Kitchen VIDEO BRONCHOSCOPY N/A 12/15/2013   Procedure:  VIDEO BRONCHOSCOPY;  Surgeon: Grace Isaac, MD;  Location: [facility] OR;  Service: Thoracic;  Laterality: N/A;    There were no vitals filed for this visit.      Subjective Assessment - 06/17/16 1543    Subjective pt. pain free today however reports she had B knee pain and stiffness for two days following last treatment's standing activity.     Patient Stated Goals "To be able to walk w/o my cane"   Currently in Pain? No/denies   Pain Score 0-No pain   Multiple Pain Sites No        Today's Treatment:  TherEx: NuStep -level 7x 5 min  L HS, glute, piriformis  stretch x 30 sec each Bridge x 15 reps  Hooklying B hip abd/ER with black TB x 15 reps B sidelying clam shell with blue TB x 10 reps  Hooklying bridge with alternating hip abd/ER with black  TB x 10 reps each side  L hip abduction 3 x 10 reps  Seated high knee march with black TB x 10 reps each side Seated alternating hip ER with black TB x 15 reps each side  Seated side step with black TB x 10 reps each side          PT Short Term Goals - 06/06/16 1526      PT SHORT TERM GOAL #1   Title Independent with initial HEP by 06/07/16   Status Achieved           PT Long Term Goals - 06/06/16 1527      PT LONG TERM GOAL #1   Title Independent with advanced HEP as indicated by 07/14/16   Status On-going     PT LONG TERM GOAL #2   Title B hip strength >/= 4/5 for improved gait stability by 07/14/16   Status On-going     PT LONG TERM GOAL #3   Title Pt will complete TUG in </= 13.5 sec to decrease risk for falls by 07/14/16   Status On-going     PT LONG TERM GOAL #4   Title Pt will demonstrate gait speed >/= 2.6 ft/sec to allow for improved safety with community ambulation by 07/14/16   Status On-going     PT LONG TERM GOAL #5   Title Pt will ambulate community distances w/o SPC w/o evidence of trendelenburg gait pattern by 07/14/16   Status On-going               Plan - 06/17/16 1532    Clinical Impression Statement Pt. reporting she had B knee stiffness and pain for two days following last treatment's standing therex.  Pt. pain free initially today, however less standing therex performed today due to last treatment's,"flare up".  Focus of today's treatment was L hip abduction, adduction, and B hip extension strengthening activity in supine.   Pt. pain free following today's treatment.     PT Treatment/Interventions Patient/family education;Therapeutic exercise;Neuromuscular re-education;Gait training;Manual techniques;Taping;Electrical Stimulation;Moist  Heat;Cryotherapy;Vasopneumatic Device;Therapeutic activities;ADLs/Self Care Home Management   PT Next Visit Plan Core/hip/knee strenthening; Piriformis stretching; Balance training with progression to dynamic gait activities as strength allows      Patient will benefit from skilled therapeutic intervention in order to improve the following deficits and impairments:  Pain, Decreased strength, Difficulty walking, Abnormal gait, Decreased balance, Decreased activity tolerance, Decreased endurance, Decreased range of motion  Visit Diagnosis: Difficulty in walking, not elsewhere classified  Other abnormalities of gait and mobility  Pain in left knee  Pain in right knee     Problem List Patient Active Problem List   Diagnosis Date Noted  . Bilateral knee pain 05/02/2016  . Trochanteric bursitis of left hip 01/11/2016  . Low back pain 10/02/2015  . Anxiety state 06/28/2015  . Fatigue 06/28/2015  . Shingles 11/24/2014  . Tremor of both hands 09/21/2014  . Gout due to renal impairment 08/30/2014  . Pedal edema 08/23/2014  . Physical deconditioning 06/23/2014  . Leukocytosis 06/13/2014  . UTI (lower urinary tract infection) 06/13/2014  . CKD (chronic kidney disease) stage 3, GFR 30-59 ml/min 05/27/2014  . IgA nephropathy 05/27/2014  . Chronic diastolic heart failure ([place]) 05/03/2014  . Dyspnea 04/23/2014  . Steroid-induced diabetes ([place]) 04/18/2014  . Acute bronchitis 04/18/2014  . Obesity (BMI 30-39.9) 04/12/2014  . Hematuria 02/25/2014  . Diastolic dysfunction- grade 2 02/16/2014  . DOE (dyspnea on exertion) 02/11/2014  . Leg edema 02/11/2014  . Edema of both legs 02/04/2014  . CAD (coronary artery disease), non obstructive by cardiac cath 12/12/13 01/24/2014  . SOB (shortness of breath) 01/18/2014  . CKD (chronic kidney disease) stage 4, GFR 15-29 ml/min ([facility]) 01/03/2014  . Lung cancer, left lower lobe 12/17/2013  . Status post lobectomy of lung 12/15/2013  . Chest pain with  moderate risk of acute coronary syndrome 12/12/2013  . Family history of coronary artery bypass surgery 12/12/2013  . Renal artery atherosclerosis, s/p right RA stent 11/08/13 11/08/2013  . Lung nodule/ adenoca > LLobectomy 12/15/13  11/08/2013  . Premature atrial contractions 10/31/2013  . Hearing loss secondary to cerumen impaction 09/20/2013  . Allergic rhinitis 02/11/2013  . Shoulder pain 03/11/2012  . Upper arm pain 01/26/2012  . General medical examination 07/25/2011  . Obesity- BMI 41 05/10/2011  . Dizziness 04/10/2011  . Hyperlipidemia 02/28/2011  . EDEMA- LOCALIZED 06/04/2010  . OSTEOPENIA 01/15/2010  . KNEE PAIN 11/10/2009  . CARCINOMA, BASAL CELL 09/22/2009  . SKIN LESION 09/18/2009  . ESOPHAGITIS 05/19/2009  . GASTRIC ULCER 05/19/2009  . HIATAL HERNIA 05/19/2009  . DEGENERATIVE JOINT DISEASE 05/19/2009  . Hypothyroidism 03/23/2009  . Essential hypertension 03/23/2009  . ASTHMA 03/23/2009  . GERD 03/23/2009  . COLONIC POLYPS, HX OF 03/23/2009    Bess Harvest, PTA 06/17/2016, 3:47 PM  [facility] [address]  [place], [place], [address] Phone: [phone]   Fax:  [phone]  Name: LEXINE JASPERS MRN: [id number] Date of Birth: [birth year]

## 2016-06-18 ENCOUNTER — Ambulatory Visit
Admission: RE | Admit: 2016-06-18 | Discharge: 2016-06-18 | Disposition: A | Discharge status: Home / Self Care | Payer: Medicare Other | Source: Ambulatory Visit | Attending: Nephrology | Admitting: Nephrology

## 2016-06-18 DIAGNOSIS — I129 Hypertensive chronic kidney disease with stage 1 through stage 4 chronic kidney disease, or unspecified chronic kidney disease: Secondary | ICD-10-CM

## 2016-06-18 DIAGNOSIS — N281 Cyst of kidney, acquired: Secondary | ICD-10-CM | POA: Diagnosis not present

## 2016-06-18 DIAGNOSIS — N184 Chronic kidney disease, stage 4 (severe): Principal | ICD-10-CM

## 2016-06-19 ENCOUNTER — Ambulatory Visit: Payer: Medicare Other

## 2016-06-19 DIAGNOSIS — M25561 Pain in right knee: Secondary | ICD-10-CM

## 2016-06-19 DIAGNOSIS — R2689 Other abnormalities of gait and mobility: Secondary | ICD-10-CM | POA: Diagnosis not present

## 2016-06-19 DIAGNOSIS — M25562 Pain in left knee: Secondary | ICD-10-CM | POA: Diagnosis not present

## 2016-06-19 DIAGNOSIS — R262 Difficulty in walking, not elsewhere classified: Secondary | ICD-10-CM

## 2016-06-19 NOTE — Therapy (Signed)
[facility] [address]  [place], [place], [address] Phone: [phone]   Fax:  [phone]  Physical Therapy Treatment  Patient Details  Name: Susan Pittman MRN: [id number] Date of Birth: 12/09/1940 Referring Provider: Dene Gentry, MD  Encounter Date: 06/19/2016      PT End of Session - 06/19/16 1531    Visit Number 8   Number of Visits 12   Date for PT Re-Evaluation 07/14/16   PT Start Time 0737   PT Stop Time 1525   PT Time Calculation (min) 40 min   Activity Tolerance Patient tolerated treatment well   Behavior During Therapy [facility] for tasks assessed/performed      Past Medical History:  Diagnosis Date  . Allergy   . Arthritis    "knees, hips, back, hands" (11/08/2013)  . Asthma   . Basal cell carcinoma    "cut them off face & right shoulder" (11/08/2013)  . CAD (coronary artery disease), non obstructive by cardiac cath 12/12/13 01/24/2014  . CKD (chronic kidney disease), stage III   . Diastolic CHF ([place])   . DJD (degenerative joint disease)   . Gastric ulcer   . GERD (gastroesophageal reflux disease)   . Hiatal hernia   . Hyperlipidemia    "don't take RX for it" (11/08/2013)  . Hypertension   . Hypothyroid   . Lung mass    superior segment LLL/notes 11/08/2013  . Lung nodule/ adenoca > LLobectomy 12/15/13  11/08/2013   PET 11/23/2013 1. Dominant sub solid left lower lobe nodule does not demonstrate significantly increased metabolic activity. However, morphologically, adenocarcinoma is still a concern.   - Spirometry 11/24/13 wnl  - 11/24/2013 T surg eval rec> LLower lobectomy 12/15/2013 for adenoca   . Nephropathy   . Osteoporosis   . Pneumonia    "I've had it ?3 times" (11/08/2013)  . Renal artery stenosis ([place])    with stent placement    Past Surgical History:  Procedure Laterality Date  . ANTERIOR CERVICAL DECOMP/DISCECTOMY FUSION  2000   C5-C6  . CARDIAC CATHETERIZATION     Hx: of 2/ 2015  .  CHOLECYSTECTOMY  1970's  . CORONARY ANGIOGRAM     Hx: of 2/ 2015  . DILATION AND CURETTAGE OF UTERUS  1960's  . KNEE ARTHROSCOPY Bilateral   . LEFT HEART CATHETERIZATION WITH CORONARY ANGIOGRAM N/A 12/13/2013   Procedure: LEFT HEART CATHETERIZATION WITH CORONARY ANGIOGRAM;  Surgeon: Burnell Blanks, MD;  Location: [facility] CATH LAB;  Service: Cardiovascular;  Laterality: N/A;  . LOBECTOMY Left 12/15/2013   Procedure: LOBECTOMY;  Surgeon: Grace Isaac, MD;  Location: [place];  Service: Thoracic;  Laterality: Left;  . PERCUTANEOUS STENT INTERVENTION Right 11/08/2013   Procedure: PERCUTANEOUS STENT INTERVENTION;  Surgeon: Lorretta Harp, MD;  Location: [facility] CATH LAB;  Service: Cardiovascular;  Laterality: Right;  rt renal artery stent  . RENAL ANGIOGRAM Bilateral 11/08/2013   Procedure: RENAL ANGIOGRAM;  Surgeon: Lorretta Harp, MD;  Location: [facility] CATH LAB;  Service: Cardiovascular;  Laterality: Bilateral;  . RENAL ARTERY STENT Right 11/08/2013   Archie Endo 11/08/2013  . SKIN CANCER EXCISION    . TONSILLECTOMY AND ADENOIDECTOMY  ?1946  . VAGINAL HYSTERECTOMY  1970  . VIDEO ASSISTED THORACOSCOPY (VATS)/WEDGE RESECTION Left 12/15/2013   Procedure: VIDEO ASSISTED THORACOSCOPY (VATS)/WEDGE RESECTION;  Surgeon: Grace Isaac, MD;  Location: [place];  Service: Thoracic;  Laterality: Left;  Marland Kitchen VIDEO BRONCHOSCOPY N/A 12/15/2013   Procedure:  VIDEO BRONCHOSCOPY;  Surgeon: Grace Isaac, MD;  Location: [facility] OR;  Service: Thoracic;  Laterality: N/A;    There were no vitals filed for this visit.      Subjective Assessment - 06/19/16 1529    Subjective Pt. reports she is pain free in B knees today initially and just attended water aerobics before therapy today.      Patient Stated Goals "To be able to walk w/o my cane"   Currently in Pain? No/denies   Pain Score 0-No pain   Multiple Pain Sites No       Today's Treatment:  TherEx: NuStep -level 5x 5 min  Bridge 2 x 10reps  Hooklying bridge with B  hip abd/ER with black TB x 10 reps Hooklying alternating hip abd/ER with black TB x 10 reps  B sidelying clam shell with blue TB x 15 reps   Hooklying bridge with alternating hip abd/ER with black  TB x 7 reps each side  L hip abduction 2 x 10 reps  Standing L hip hike with L LE on airex pad 2 x 10 reps; B UE support  Supine LE march with black TB x 15 reps each side L sidelying L hip adduction 2 x 10 reps          PT Short Term Goals - 06/06/16 1526      PT SHORT TERM GOAL #1   Title Independent with initial HEP by 06/07/16   Status Achieved           PT Long Term Goals - 06/06/16 1527      PT LONG TERM GOAL #1   Title Independent with advanced HEP as indicated by 07/14/16   Status On-going     PT LONG TERM GOAL #2   Title B hip strength >/= 4/5 for improved gait stability by 07/14/16   Status On-going     PT LONG TERM GOAL #3   Title Pt will complete TUG in </= 13.5 sec to decrease risk for falls by 07/14/16   Status On-going     PT LONG TERM GOAL #4   Title Pt will demonstrate gait speed >/= 2.6 ft/sec to allow for improved safety with community ambulation by 07/14/16   Status On-going     PT LONG TERM GOAL #5   Title Pt will ambulate community distances w/o SPC w/o evidence of trendelenburg gait pattern by 07/14/16   Status On-going               Plan - 06/19/16 1531    Clinical Impression Statement Pt. reports she is pain free in B knees today initially and just attended water aerobics before therapy today.   Pt. tolerated standing L hip abduction strengthening activity well however continues to ambulate with Trendelenburg gait.  Today's treatment centered on B hip / knee strengthening with focus on L hip abduction strengthening activity.    PT Treatment/Interventions Patient/family education;Therapeutic exercise;Neuromuscular re-education;Gait training;Manual techniques;Taping;Electrical Stimulation;Moist Heat;Cryotherapy;Vasopneumatic Device;Therapeutic  activities;ADLs/Self Care Home Management   PT Next Visit Plan Core/hip/knee strenthening; Piriformis stretching; Balance training with progression to dynamic gait activities as strength allows      Patient will benefit from skilled therapeutic intervention in order to improve the following deficits and impairments:  Pain, Decreased strength, Difficulty walking, Abnormal gait, Decreased balance, Decreased activity tolerance, Decreased endurance, Decreased range of motion  Visit Diagnosis: Difficulty in walking, not elsewhere classified  Other abnormalities of gait and mobility  Pain in left knee  Pain in right knee     Problem List Patient Active Problem List   Diagnosis Date Noted  . Bilateral knee pain 05/02/2016  . Trochanteric bursitis of left hip 01/11/2016  . Low back pain 10/02/2015  . Anxiety state 06/28/2015  . Fatigue 06/28/2015  . Shingles 11/24/2014  . Tremor of both hands 09/21/2014  . Gout due to renal impairment 08/30/2014  . Pedal edema 08/23/2014  . Physical deconditioning 06/23/2014  . Leukocytosis 06/13/2014  . UTI (lower urinary tract infection) 06/13/2014  . CKD (chronic kidney disease) stage 3, GFR 30-59 ml/min 05/27/2014  . IgA nephropathy 05/27/2014  . Chronic diastolic heart failure ([place]) 05/03/2014  . Dyspnea 04/23/2014  . Steroid-induced diabetes ([place]) 04/18/2014  . Acute bronchitis 04/18/2014  . Obesity (BMI 30-39.9) 04/12/2014  . Hematuria 02/25/2014  . Diastolic dysfunction- grade 2 02/16/2014  . DOE (dyspnea on exertion) 02/11/2014  . Leg edema 02/11/2014  . Edema of both legs 02/04/2014  . CAD (coronary artery disease), non obstructive by cardiac cath 12/12/13 01/24/2014  . SOB (shortness of breath) 01/18/2014  . CKD (chronic kidney disease) stage 4, GFR 15-29 ml/min (HCC) 01/03/2014  . Lung cancer, left lower lobe 12/17/2013  . Status post lobectomy of lung 12/15/2013  . Chest pain with moderate risk of acute coronary syndrome  12/12/2013  . Family history of coronary artery bypass surgery 12/12/2013  . Renal artery atherosclerosis, s/p right RA stent 11/08/13 11/08/2013  . Lung nodule/ adenoca > LLobectomy 12/15/13  11/08/2013  . Premature atrial contractions 10/31/2013  . Hearing loss secondary to cerumen impaction 09/20/2013  . Allergic rhinitis 02/11/2013  . Shoulder pain 03/11/2012  . Upper arm pain 01/26/2012  . General medical examination 07/25/2011  . Obesity- BMI 41 05/10/2011  . Dizziness 04/10/2011  . Hyperlipidemia 02/28/2011  . EDEMA- LOCALIZED 06/04/2010  . OSTEOPENIA 01/15/2010  . KNEE PAIN 11/10/2009  . CARCINOMA, BASAL CELL 09/22/2009  . SKIN LESION 09/18/2009  . ESOPHAGITIS 05/19/2009  . GASTRIC ULCER 05/19/2009  . HIATAL HERNIA 05/19/2009  . DEGENERATIVE JOINT DISEASE 05/19/2009  . Hypothyroidism 03/23/2009  . Essential hypertension 03/23/2009  . ASTHMA 03/23/2009  . GERD 03/23/2009  . COLONIC POLYPS, HX OF 03/23/2009    Bess Harvest, PTA 06/19/2016, 6:09 PM  [facility] [address]  [address] [place], [place], [address] Phone: [phone]   Fax:  [phone]  Name: Susan Pittman MRN: [id number] Date of Birth: 06-21-1941

## 2016-06-24 ENCOUNTER — Ambulatory Visit: Payer: Medicare Other

## 2016-06-24 DIAGNOSIS — M25561 Pain in right knee: Secondary | ICD-10-CM | POA: Diagnosis not present

## 2016-06-24 DIAGNOSIS — R2689 Other abnormalities of gait and mobility: Secondary | ICD-10-CM | POA: Diagnosis not present

## 2016-06-24 DIAGNOSIS — R262 Difficulty in walking, not elsewhere classified: Secondary | ICD-10-CM | POA: Diagnosis not present

## 2016-06-24 DIAGNOSIS — M25562 Pain in left knee: Secondary | ICD-10-CM | POA: Diagnosis not present

## 2016-06-24 NOTE — Therapy (Signed)
[facility] [address]  [place], [place], [address] Phone: [phone]   Fax:  [phone]  Physical Therapy Treatment  Patient Details  Name: Susan Pittman MRN: [id number] Date of Birth: [birth year] Referring Provider: Dene Gentry, MD  Encounter Date: 06/24/2016      PT End of Session - 06/24/16 1417    Visit Number 9   Number of Visits 12   Date for PT Re-Evaluation 07/14/16   PT Start Time 1409   PT Stop Time 1448   PT Time Calculation (min) 39 min   Activity Tolerance Patient tolerated treatment well   Behavior During Therapy [facility] for tasks assessed/performed      Past Medical History:  Diagnosis Date  . Allergy   . Arthritis    "knees, hips, back, hands" (11/08/2013)  . Asthma   . Basal cell carcinoma    "cut them off face & right shoulder" (11/08/2013)  . CAD (coronary artery disease), non obstructive by cardiac cath 12/12/13 01/24/2014  . CKD (chronic kidney disease), stage III   . Diastolic CHF ([place])   . DJD (degenerative joint disease)   . Gastric ulcer   . GERD (gastroesophageal reflux disease)   . Hiatal hernia   . Hyperlipidemia    "don't take RX for it" (11/08/2013)  . Hypertension   . Hypothyroid   . Lung mass    superior segment LLL/notes 11/08/2013  . Lung nodule/ adenoca > LLobectomy 12/15/13  11/08/2013   PET 11/23/2013 1. Dominant sub solid left lower lobe nodule does not demonstrate significantly increased metabolic activity. However, morphologically, adenocarcinoma is still a concern.   - Spirometry 11/24/13 wnl  - 11/24/2013 T surg eval rec> LLower lobectomy 12/15/2013 for adenoca   . Nephropathy   . Osteoporosis   . Pneumonia    "I've had it ?3 times" (11/08/2013)  . Renal artery stenosis ([place])    with stent placement    Past Surgical History:  Procedure Laterality Date  . ANTERIOR CERVICAL DECOMP/DISCECTOMY FUSION  2000   C5-C6  . CARDIAC CATHETERIZATION     Hx: of 2/ 2015  .  CHOLECYSTECTOMY  1970's  . CORONARY ANGIOGRAM     Hx: of 2/ 2015  . DILATION AND CURETTAGE OF UTERUS  1960's  . KNEE ARTHROSCOPY Bilateral   . LEFT HEART CATHETERIZATION WITH CORONARY ANGIOGRAM N/A 12/13/2013   Procedure: LEFT HEART CATHETERIZATION WITH CORONARY ANGIOGRAM;  Surgeon: Burnell Blanks, MD;  Location: [facility] CATH LAB;  Service: Cardiovascular;  Laterality: N/A;  . LOBECTOMY Left 12/15/2013   Procedure: LOBECTOMY;  Surgeon: Grace Isaac, MD;  Location: [facility];  Service: Thoracic;  Laterality: Left;  . PERCUTANEOUS STENT INTERVENTION Right 11/08/2013   Procedure: PERCUTANEOUS STENT INTERVENTION;  Surgeon: Lorretta Harp, MD;  Location: [facility] CATH LAB;  Service: Cardiovascular;  Laterality: Right;  rt renal artery stent  . RENAL ANGIOGRAM Bilateral 11/08/2013   Procedure: RENAL ANGIOGRAM;  Surgeon: Lorretta Harp, MD;  Location: [facility] CATH LAB;  Service: Cardiovascular;  Laterality: Bilateral;  . RENAL ARTERY STENT Right 11/08/2013   Archie Endo 11/08/2013  . SKIN CANCER EXCISION    . TONSILLECTOMY AND ADENOIDECTOMY  ?1946  . VAGINAL HYSTERECTOMY  1970  . VIDEO ASSISTED THORACOSCOPY (VATS)/WEDGE RESECTION Left 12/15/2013   Procedure: VIDEO ASSISTED THORACOSCOPY (VATS)/WEDGE RESECTION;  Surgeon: Grace Isaac, MD;  Location: [place];  Service: Thoracic;  Laterality: Left;  Marland Kitchen VIDEO BRONCHOSCOPY N/A 12/15/2013   Procedure:  VIDEO BRONCHOSCOPY;  Surgeon: Grace Isaac, MD;  Location: [facility] OR;  Service: Thoracic;  Laterality: N/A;    There were no vitals filed for this visit.      Subjective Assessment - 06/24/16 1414    Subjective Pt. with worsened pain today 6/10 initial B knee pain.     Patient Stated Goals "To be able to walk w/o my cane"   Currently in Pain? Yes   Pain Score 6    Pain Location Knee   Pain Orientation Right;Left   Pain Descriptors / Indicators Constant   Pain Type Chronic pain   Pain Onset More than a month ago   Pain Frequency Intermittent   Multiple Pain Sites  No        Today's Treatment:  TherEx (pt. with increased B knee pain initially today thus more conservative therex): NuStep -level 7x 5 min  Hooklying bridge x 10reps  Hooklying bridge with B hip abd/ER with blue TB x 15 reps Hooklying LE march with blue TB x 15 reps each Hooklying alternating hip abd/ER with blue TB x 10 reps  B sidelying clam shell with blue TB x 15 reps   Hooklying bridge with alternating (x 2) hip abd/ER with black TB x 10 reps  Neuro Re-ed: Side stepping with yellow TB around ankles x 4 laps down/back next to mat table Standing alternating hip flexion x 10 rep each side; 2 pole support  Standing alternating hip ext. X 10 reps each side; 2 pole support    Therex: Recumbent bike: level 2, 5 min (to "loosen up the knees")        PT Short Term Goals - 06/06/16 1526      PT SHORT TERM GOAL #1   Title Independent with initial HEP by 06/07/16   Status Achieved           PT Long Term Goals - 06/06/16 1527      PT LONG TERM GOAL #1   Title Independent with advanced HEP as indicated by 07/14/16   Status On-going     PT LONG TERM GOAL #2   Title B hip strength >/= 4/5 for improved gait stability by 07/14/16   Status On-going     PT LONG TERM GOAL #3   Title Pt will complete TUG in </= 13.5 sec to decrease risk for falls by 07/14/16   Status On-going     PT LONG TERM GOAL #4   Title Pt will demonstrate gait speed >/= 2.6 ft/sec to allow for improved safety with community ambulation by 07/14/16   Status On-going     PT LONG TERM GOAL #5   Title Pt will ambulate community distances w/o SPC w/o evidence of trendelenburg gait pattern by 07/14/16   Status On-going               Plan - 06/24/16 1417    Clinical Impression Statement Pt. with worsened pain today 6/10 pain initially in B knees; pt. could not identify a trigger.  Today's treatment focused on hip/knee strengthening activities with standing hip abduction strengthening per pt.  tolerance.  Pt. still with limited tolerance for standing activities per medial knee pain today.  Pt. able to end treatment today pain free.  3 more treatment in POC.     PT Treatment/Interventions Patient/family education;Therapeutic exercise;Neuromuscular re-education;Gait training;Manual techniques;Taping;Electrical Stimulation;Moist Heat;Cryotherapy;Vasopneumatic Device;Therapeutic activities;ADLs/Self Care Home Management   PT Next Visit Plan Core/hip/knee strenthening; Piriformis stretching; Balance training with progression to dynamic gait  activities as strength allows      Patient will benefit from skilled therapeutic intervention in order to improve the following deficits and impairments:  Pain, Decreased strength, Difficulty walking, Abnormal gait, Decreased balance, Decreased activity tolerance, Decreased endurance, Decreased range of motion  Visit Diagnosis: Difficulty in walking, not elsewhere classified  Other abnormalities of gait and mobility  Pain in left knee  Pain in right knee     Problem List Patient Active Problem List   Diagnosis Date Noted  . Bilateral knee pain 05/02/2016  . Trochanteric bursitis of left hip 01/11/2016  . Low back pain 10/02/2015  . Anxiety state 06/28/2015  . Fatigue 06/28/2015  . Shingles 11/24/2014  . Tremor of both hands 09/21/2014  . Gout due to renal impairment 08/30/2014  . Pedal edema 08/23/2014  . Physical deconditioning 06/23/2014  . Leukocytosis 06/13/2014  . UTI (lower urinary tract infection) 06/13/2014  . CKD (chronic kidney disease) stage 3, GFR 30-59 ml/min 05/27/2014  . IgA nephropathy 05/27/2014  . Chronic diastolic heart failure ([place]) 05/03/2014  . Dyspnea 04/23/2014  . Steroid-induced diabetes ([place]) 04/18/2014  . Acute bronchitis 04/18/2014  . Obesity (BMI 30-39.9) 04/12/2014  . Hematuria 02/25/2014  . Diastolic dysfunction- grade 2 02/16/2014  . DOE (dyspnea on exertion) 02/11/2014  . Leg edema 02/11/2014  .  Edema of both legs 02/04/2014  . CAD (coronary artery disease), non obstructive by cardiac cath 12/12/13 01/24/2014  . SOB (shortness of breath) 01/18/2014  . CKD (chronic kidney disease) stage 4, GFR 15-29 ml/min (H[facility]) 01/03/2014  . Lung cancer, left lower lobe 12/17/2013  . Status post lobectomy of lung 12/15/2013  . Chest pain with moderate risk of acute coronary syndrome 12/12/2013  . Family history of coronary artery bypass surgery 12/12/2013  . Renal artery atherosclerosis, s/p right RA stent 11/08/13 11/08/2013  . Lung nodule/ adenoca > LLobectomy 12/15/13  11/08/2013  . Premature atrial contractions 10/31/2013  . Hearing loss secondary to cerumen impaction 09/20/2013  . Allergic rhinitis 02/11/2013  . Shoulder pain 03/11/2012  . Upper arm pain 01/26/2012  . General medical examination 07/25/2011  . Obesity- BMI 41 05/10/2011  . Dizziness 04/10/2011  . Hyperlipidemia 02/28/2011  . EDEMA- LOCALIZED 06/04/2010  . OSTEOPENIA 01/15/2010  . KNEE PAIN 11/10/2009  . CARCINOMA, BASAL CELL 09/22/2009  . SKIN LESION 09/18/2009  . ESOPHAGITIS 05/19/2009  . GASTRIC ULCER 05/19/2009  . HIATAL HERNIA 05/19/2009  . DEGENERATIVE JOINT DISEASE 05/19/2009  . Hypothyroidism 03/23/2009  . Essential hypertension 03/23/2009  . ASTHMA 03/23/2009  . GERD 03/23/2009  . COLONIC POLYPS, HX OF 03/23/2009    Bess Harvest, PTA 06/24/2016, 3:07 PM  [facility] [place], [place], [address] Phone: [phone]   Fax:  [phone]  Name: Susan Pittman MRN: [id number] Date of Birth: 06-10-1941

## 2016-06-26 ENCOUNTER — Ambulatory Visit: Payer: Medicare Other

## 2016-06-26 ENCOUNTER — Ambulatory Visit (INDEPENDENT_AMBULATORY_CARE_PROVIDER_SITE_OTHER): Payer: Medicare Other | Admitting: Family Medicine

## 2016-06-26 ENCOUNTER — Encounter: Payer: Self-pay | Admitting: Family Medicine

## 2016-06-26 DIAGNOSIS — R262 Difficulty in walking, not elsewhere classified: Secondary | ICD-10-CM

## 2016-06-26 DIAGNOSIS — M25561 Pain in right knee: Secondary | ICD-10-CM

## 2016-06-26 DIAGNOSIS — M25562 Pain in left knee: Secondary | ICD-10-CM

## 2016-06-26 DIAGNOSIS — I701 Atherosclerosis of renal artery: Secondary | ICD-10-CM | POA: Diagnosis not present

## 2016-06-26 DIAGNOSIS — R2689 Other abnormalities of gait and mobility: Secondary | ICD-10-CM

## 2016-06-26 NOTE — Therapy (Addendum)
[facility] [address]  [place], [place], [address] Phone: [phone]   Fax:  [phone]  Physical Therapy Treatment  Patient Details  Name: Susan Pittman MRN: [id number] Date of Birth: October 04, 1941 Referring Provider: Dene Gentry, MD  Encounter Date: 06/26/2016      PT End of Session - 06/26/16 1453    Visit Number 10   Number of Visits 12   Date for PT Re-Evaluation 07/14/16   PT Start Time 1007   PT Stop Time 1526   PT Time Calculation (min) 40 min   Activity Tolerance Patient tolerated treatment well   Behavior During Therapy [facility] for tasks assessed/performed      Past Medical History:  Diagnosis Date  . Allergy   . Arthritis    "knees, hips, back, hands" (11/08/2013)  . Asthma   . Basal cell carcinoma    "cut them off face & right shoulder" (11/08/2013)  . CAD (coronary artery disease), non obstructive by cardiac cath 12/12/13 01/24/2014  . CKD (chronic kidney disease), stage III   . Diastolic CHF ([place])   . DJD (degenerative joint disease)   . Gastric ulcer   . GERD (gastroesophageal reflux disease)   . Hiatal hernia   . Hyperlipidemia    "don't take RX for it" (11/08/2013)  . Hypertension   . Hypothyroid   . Lung mass    superior segment LLL/notes 11/08/2013  . Lung nodule/ adenoca > LLobectomy 12/15/13  11/08/2013   PET 11/23/2013 1. Dominant sub solid left lower lobe nodule does not demonstrate significantly increased metabolic activity. However, morphologically, adenocarcinoma is still a concern.   - Spirometry 11/24/13 wnl  - 11/24/2013 T surg eval rec> LLower lobectomy 12/15/2013 for adenoca   . Nephropathy   . Osteoporosis   . Pneumonia    "I've had it ?3 times" (11/08/2013)  . Renal artery stenosis ([place])    with stent placement    Past Surgical History:  Procedure Laterality Date  . ANTERIOR CERVICAL DECOMP/DISCECTOMY FUSION  2000   C5-C6  . CARDIAC CATHETERIZATION     Hx: of 2/ 2015  .  CHOLECYSTECTOMY  1970's  . CORONARY ANGIOGRAM     Hx: of 2/ 2015  . DILATION AND CURETTAGE OF UTERUS  1960's  . KNEE ARTHROSCOPY Bilateral   . LEFT HEART CATHETERIZATION WITH CORONARY ANGIOGRAM N/A 12/13/2013   Procedure: LEFT HEART CATHETERIZATION WITH CORONARY ANGIOGRAM;  Surgeon: Burnell Blanks, MD;  Location: [facility] CATH LAB;  Service: Cardiovascular;  Laterality: N/A;  . LOBECTOMY Left 12/15/2013   Procedure: LOBECTOMY;  Surgeon: Grace Isaac, MD;  Location: [place];  Service: Thoracic;  Laterality: Left;  . PERCUTANEOUS STENT INTERVENTION Right 11/08/2013   Procedure: PERCUTANEOUS STENT INTERVENTION;  Surgeon: Lorretta Harp, MD;  Location: [facility] CATH LAB;  Service: Cardiovascular;  Laterality: Right;  rt renal artery stent  . RENAL ANGIOGRAM Bilateral 11/08/2013   Procedure: RENAL ANGIOGRAM;  Surgeon: Lorretta Harp, MD;  Location: [facility] CATH LAB;  Service: Cardiovascular;  Laterality: Bilateral;  . RENAL ARTERY STENT Right 11/08/2013   Archie Endo 11/08/2013  . SKIN CANCER EXCISION    . TONSILLECTOMY AND ADENOIDECTOMY  ?1946  . VAGINAL HYSTERECTOMY  1970  . VIDEO ASSISTED THORACOSCOPY (VATS)/WEDGE RESECTION Left 12/15/2013   Procedure: VIDEO ASSISTED THORACOSCOPY (VATS)/WEDGE RESECTION;  Surgeon: Grace Isaac, MD;  Location: [place];  Service: Thoracic;  Laterality: Left;  Marland Kitchen VIDEO BRONCHOSCOPY N/A 12/15/2013   Procedure:  VIDEO BRONCHOSCOPY;  Surgeon: Grace Isaac, MD;  Location: [facility] OR;  Service: Thoracic;  Laterality: N/A;    There were no vitals filed for this visit.      Subjective Assessment - 06/26/16 1449    Subjective Pt. reports that her pain is much improved today at an initial 1/10 B knee pain L>R.     Patient Stated Goals "To be able to walk w/o my cane"   Currently in Pain? Yes   Pain Score 1    Pain Location Knee   Pain Orientation Right;Left   Pain Descriptors / Indicators Constant   Pain Type Chronic pain   Pain Onset More than a month ago   Pain Frequency  Intermittent   Multiple Pain Sites No            [facility] PT Assessment - 06/26/16 1513      Observation/Other Assessments   Focus on Therapeutic Outcomes (FOTO)  49% (51% limitation)        Ambulation/Gait   Gait velocity 1.97 ft/sec     Timed Up and Go Test   TUG Normal TUG   Normal TUG (seconds) 13.8       Today's Treatment:  TherEx: NuStep:level 7x 5 min  Hooklying bridge x 15reps  Hooklying bridge with alternating (2x) hip abd/ER with black TB x 10reps Hooklying LE march with black TB x 15 reps each Hooklying alternating hip abd/ER with black TB x 10 reps  B sidelying clam shell with black TB x 10reps  Hooklying bridge with B hip abd/ER with black TB x 15 reps  Neuro Re-ed: Alternating lunge onto BOSU ball (up) x 10 reps each side  Alternating toe-touch on 10" step x 10 reps each side        PT Short Term Goals - 06/06/16 1526      PT SHORT TERM GOAL #1   Title Independent with initial HEP by 06/07/16   Status Achieved           PT Long Term Goals - 06/26/16 1516      PT LONG TERM GOAL #1   Title Independent with advanced HEP as indicated by 07/14/16   Status On-going     PT LONG TERM GOAL #2   Title B hip strength >/= 4/5 for improved gait stability by 07/14/16   Status Unable to assess  unable to assess today due to time constraints      PT LONG TERM GOAL #3   Title Pt will complete TUG in </= 13.5 sec to decrease risk for falls by 07/14/16   Status On-going  06/26/16: pt. able to complete normal TUG in 13.80 sec      PT LONG TERM GOAL #4   Title Pt will demonstrate gait speed >/= 2.6 ft/sec to allow for improved safety with community ambulation by 07/14/16   Status On-going  06/26/16: Pt. able to ambulate 1.97 ft/sec with SPC      PT LONG TERM GOAL #5   Title Pt will ambulate community distances w/o SPC w/o evidence of trendelenburg gait pattern by 07/14/16   Status Partially Met  06/26/16: Pt. able to walk short distances w/o SPC  however still with trendelenburg gait pattern.                 Plan - 06/26/16 1510    Clinical Impression Statement Pt. with only mild 1/10 B knee pain initially today and reports she was able to perform HEP twice  today and attend water aerobics.  Pt. is now able to walk short distances without SPC however still demo's trendelenburg gait pattern at this time.  Pt. able to complete normal TUG test in 13.80 sec today, and ambulate with a gait speed of 1.97 ft/sec.  Pt. reporting ability to ambulate longer distances with less knee pain at this point however still reports B knee pain upon rising from being seated for a long period of time.  Pt. seems to still be progressing well at this point.  Pt. to see MD on 2023-07-23 and PT on 8/28 for next to last treatment in POC.    PT Treatment/Interventions Patient/family education;Therapeutic exercise;Neuromuscular re-education;Gait training;Manual techniques;Taping;Electrical Stimulation;Moist Heat;Cryotherapy;Vasopneumatic Device;Therapeutic activities;ADLs/Self Care Home Management   PT Next Visit Plan Core/hip/knee strenthening; Piriformis stretching; Balance training with progression to dynamic gait activities as strength allows      Patient will benefit from skilled therapeutic intervention in order to improve the following deficits and impairments:  Pain, Decreased strength, Difficulty walking, Abnormal gait, Decreased balance, Decreased activity tolerance, Decreased endurance, Decreased range of motion  Visit Diagnosis: Difficulty in walking, not elsewhere classified  Other abnormalities of gait and mobility  Pain in left knee  Pain in right knee       G-Codes - July 22, 2016 1526    Functional Assessment Tool Used TUG = 13.8 sec (20.0% Impaired)   Functional Limitation Mobility: Walking and moving around   Mobility: Walking and Moving Around Current Status [phone]) At least 20 percent but less than 40 percent impaired, limited or restricted    Mobility: Walking and Moving Around Goal Status [id number]) At least 20 percent but less than 40 percent impaired, limited or restricted      Problem List Patient Active Problem List   Diagnosis Date Noted  . Bilateral knee pain 05/02/2016  . Trochanteric bursitis of left hip 01/11/2016  . Low back pain 10/02/2015  . Anxiety state 06/28/2015  . Fatigue 06/28/2015  . Shingles 11/24/2014  . Tremor of both hands 09/21/2014  . Gout due to renal impairment 08/30/2014  . Pedal edema 08/23/2014  . Physical deconditioning 06/23/2014  . Leukocytosis 06/13/2014  . UTI (lower urinary tract infection) 06/13/2014  . CKD (chronic kidney disease) stage 3, GFR 30-59 ml/min 05/27/2014  . IgA nephropathy 05/27/2014  . Chronic diastolic heart failure ([place]) 05/03/2014  . Dyspnea 04/23/2014  . Steroid-induced diabetes ([place]) 04/18/2014  . Acute bronchitis 04/18/2014  . Obesity (BMI 30-39.9) 04/12/2014  . Hematuria 02/25/2014  . Diastolic dysfunction- grade 2 02/16/2014  . DOE (dyspnea on exertion) 02/11/2014  . Leg edema 02/11/2014  . Edema of both legs 02/04/2014  . CAD (coronary artery disease), non obstructive by cardiac cath 12/12/13 01/24/2014  . SOB (shortness of breath) 01/18/2014  . CKD (chronic kidney disease) stage 4, GFR 15-29 ml/min (HCC) 01/03/2014  . Lung cancer, left lower lobe 12/17/2013  . Status post lobectomy of lung 12/15/2013  . Chest pain with moderate risk of acute coronary syndrome 12/12/2013  . Family history of coronary artery bypass surgery 12/12/2013  . Renal artery atherosclerosis, s/p right RA stent 11/08/13 11/08/2013  . Lung nodule/ adenoca > LLobectomy 12/15/13  11/08/2013  . Premature atrial contractions 10/31/2013  . Hearing loss secondary to cerumen impaction 09/20/2013  . Allergic rhinitis 02/11/2013  . Shoulder pain 03/11/2012  . Upper arm pain 01/26/2012  . General medical examination 07/25/2011  . Obesity- BMI 41 05/10/2011  . Dizziness 04/10/2011  .  Hyperlipidemia 02/28/2011  . EDEMA-  LOCALIZED 06/04/2010  . OSTEOPENIA 01/15/2010  . KNEE PAIN 11/10/2009  . CARCINOMA, BASAL CELL 09/22/2009  . SKIN LESION 09/18/2009  . ESOPHAGITIS 05/19/2009  . GASTRIC ULCER 05/19/2009  . HIATAL HERNIA 05/19/2009  . DEGENERATIVE JOINT DISEASE 05/19/2009  . Hypothyroidism 03/23/2009  . Essential hypertension 03/23/2009  . ASTHMA 03/23/2009  . GERD 03/23/2009  . COLONIC POLYPS, HX OF 03/23/2009    Susan Pittman, PTA 06/27/2016, 8:05 AM  [facility] [address]  [place], [place], [address] Phone: [phone]   Fax:  [phone]  Name: Susan Pittman MRN: [id number] Date of Birth: 1941-09-01  Percival Spanish, PT, MPT 06/27/16, 8:09 AM  [facility] [address]  [place], [place], [address] Phone: [phone]   Fax:  [phone]

## 2016-06-26 NOTE — Patient Instructions (Signed)
I would continue with the therapy, as you improve wean down to once a week then to just a home exercise program. We can consider cortisone injections every 3 months if needed. The gel injections are an option for your knees. Ask your kidney doctor about taking tylenol, glucosamine. Otherwise follow up with me in 2-3 months.

## 2016-06-27 NOTE — Assessment & Plan Note (Signed)
2/2 DJD.  S/p injections, doing physical therapy and improving well.  Discussed tylenol, glucosamine, topical medications and asking nephrologist to double check if these are ok.  Heat/ice, continue with cane.  Consider viscosupplementation.

## 2016-06-27 NOTE — Progress Notes (Signed)
PCP and consultation requested by: [name], MD  Subjective:   HPI: Patient is a 75 y.o. female here for bilateral knee pain.  3/8: Patient reports she's had off and on pain in left knee and left hip that worsened after coming off her prednisone (for IgA nephropathy). Was on this for two years and just off past two months. Pain in left knee is mainly posterior, 0/10 at rest, up to 10/10 and sharp at times. Worse with ambulation. Has a history of a bakers cyst here. Hip pain is lateral, worse and sharp when lying down on this side. Pain here 0/10 at rest, 7/10 at worst. No skin changes, fever, other complaints.  4/7: Patient reports she feels much better. No pain with walking in knee or hip. Pain level 0/10. Some soreness only to touch in these areas and if lying on the left side. No numbness, skin changes.  6/28: Patient reports both of her knees are hurting now. Pain level is 0/10 at rest, up to 8/10 and very stiff, achy with walking. Using a cane. Most pain anterior knees - radiates some posteriorly. She has pain in ankles as well anteriorly. No numbness, skin changes.  8/23: Patient reports she is doing well today. Had a bad day a couple PT sessions ago. Done about 10 visits so far with PT and made good progress. Doing water aerobics. Using a cane. Not taking any medications - concerned about her kidneys being affected by any medication - has not discussed with nephrologist yet. Pain level 0/10 today. Gets soreness of knees primarily with ambulation. No skin changes, numbness.  Past Medical History:  Diagnosis Date  . Allergy   . Arthritis    "knees, hips, back, hands" (11/08/2013)  . Asthma   . Basal cell carcinoma    "cut them off face & right shoulder" (11/08/2013)  . CAD (coronary artery disease), non obstructive by cardiac cath 12/12/13 01/24/2014  . CKD (chronic kidney disease), stage III   . Diastolic CHF ([place])   . DJD (degenerative joint disease)   .  Gastric ulcer   . GERD (gastroesophageal reflux disease)   . Hiatal hernia   . Hyperlipidemia    "don't take RX for it" (11/08/2013)  . Hypertension   . Hypothyroid   . Lung mass    superior segment LLL/notes 11/08/2013  . Lung nodule/ adenoca > LLobectomy 12/15/13  11/08/2013   PET 11/23/2013 1. Dominant sub solid left lower lobe nodule does not demonstrate significantly increased metabolic activity. However, morphologically, adenocarcinoma is still a concern.   - Spirometry 11/24/13 wnl  - 11/24/2013 T surg eval rec> LLower lobectomy 12/15/2013 for adenoca   . Nephropathy   . Osteoporosis   . Pneumonia    "I've had it ?3 times" (11/08/2013)  . Renal artery stenosis [facility])    with stent placement    Current Outpatient Prescriptions on File Prior to Visit  Medication Sig Dispense Refill  . albuterol (PROVENTIL HFA;VENTOLIN HFA) 108 (90 BASE) MCG/ACT inhaler Inhale 1-2 puffs into the lungs every 6 (six) hours as needed for wheezing or shortness of breath. 18 g 1  . allopurinol (ZYLOPRIM) 100 MG tablet Take 100 mg by mouth 2 (two) times daily. Takes 2 tablets in am    . benzonatate (TESSALON) 200 MG capsule Take 1 capsule (200 mg total) by mouth 2 (two) times daily as needed for cough. 60 capsule 0  . BYSTOLIC 20 MG TABS TAKE ONE TABLET BY MOUTH ONCE DAILY 30  tablet 6  . Calcium Carb-Cholecalciferol 600-800 MG-UNIT TABS Take 2 tablets by mouth daily.     . cetirizine (ZYRTEC) 10 MG tablet Take 10 mg by mouth at bedtime.     . fluticasone (FLONASE) 50 MCG/ACT nasal spray USE TWO SPRAY(S) IN EACH NOSTRIL ONCE DAILY 16 g 2  . furosemide (LASIX) 40 MG tablet TAKE ONE TABLET BY MOUTH IN THE EVENING UNTIL  WEIGHT  OF  219  IS  REACHED 90 tablet 3  . furosemide (LASIX) 80 MG tablet TAKE ONE TABLET BY MOUTH IN THE MORNING 90 tablet 0  . glucose blood (ONE TOUCH ULTRA TEST) test strip Dx E09.9. Please use one strip each time sugars are checked. Pt tests 2 times daily. 100 each 6  . hydrALAZINE (APRESOLINE) 50  MG tablet TAKE ONE TABLET BY MOUTH TWICE DAILY 60 tablet 6  . Lancets (ONETOUCH ULTRASOFT) lancets Use 1 per day dx code 249.00 100 each 12  . lansoprazole (PREVACID) 15 MG capsule Take 15 mg by mouth daily at 12 noon.    Marland Kitchen levothyroxine (SYNTHROID, LEVOTHROID) 88 MCG tablet TAKE ONE TABLET BY MOUTH ONCE DAILY BEFORE  BREAKFAST 90 tablet 0  . ONETOUCH DELICA LANCETS 49Q MISC Use to check blood sugar once daily as instructed. Dx code: E11.9 100 each 2  . sertraline (ZOLOFT) 50 MG tablet TAKE ONE & ONE-HALF TABLETS BY MOUTH ONCE DAILY 45 tablet 6   No current facility-administered medications on file prior to visit.     Past Surgical History:  Procedure Laterality Date  . ANTERIOR CERVICAL DECOMP/DISCECTOMY FUSION  2000   C5-C6  . CARDIAC CATHETERIZATION     Hx: of 2/ 2015  . CHOLECYSTECTOMY  1970's  . CORONARY ANGIOGRAM     Hx: of 2/ 2015  . DILATION AND CURETTAGE OF UTERUS  1960's  . KNEE ARTHROSCOPY Bilateral   . LEFT HEART CATHETERIZATION WITH CORONARY ANGIOGRAM N/A 12/13/2013   Procedure: LEFT HEART CATHETERIZATION WITH CORONARY ANGIOGRAM;  Surgeon: Burnell Blanks, MD;  Location: [facility] CATH LAB;  Service: Cardiovascular;  Laterality: N/A;  . LOBECTOMY Left 12/15/2013   Procedure: LOBECTOMY;  Surgeon: Grace Isaac, MD;  Location: [place];  Service: Thoracic;  Laterality: Left;  . PERCUTANEOUS STENT INTERVENTION Right 11/08/2013   Procedure: PERCUTANEOUS STENT INTERVENTION;  Surgeon: Lorretta Harp, MD;  Location[facility] CATH LAB;  Service: Cardiovascular;  Laterality: Right;  rt renal artery stent  . RENAL ANGIOGRAM Bilateral 11/08/2013   Procedure: RENAL ANGIOGRAM;  Surgeon: Lorretta Harp, MD;  Location[facility] C[facility] LAB;  Service: Cardiovascular;  Laterality: Bilateral;  . RENAL ARTERY STENT Right 11/08/2013   Archie Endo 11/08/2013  . SKIN CANCER EXCISION    . TONSILLECTOMY AND ADENOIDECTOMY  ?1946  . VAGINAL HYSTERECTOMY  1970  . VIDEO ASSISTED THORACOSCOPY (VATS)/WEDGE RESECTION Left  12/15/2013   Procedure: VIDEO ASSISTED THORACOSCOPY (VATS)/WEDGE RESECTION;  Surgeon: Grace Isaac, MD;  Location: [place];  Service: Thoracic;  Laterality: Left;  Marland Kitchen VIDEO BRONCHOSCOPY N/A 12/15/2013   Procedure: VIDEO BRONCHOSCOPY;  Surgeon: Grace Isaac, MD;  Location: [facility] OR;  Service: Thoracic;  Laterality: N/A;    Allergies  Allergen Reactions  . Benazepril Hcl Anaphylaxis and Cough  . Loratadine Anaphylaxis    anaphylaxis  . Fexofenadine Other (See Comments)    Severe back pain  . Allegra [Fexofenadine Hcl] Other (See Comments)    Severe back pain  . Celecoxib Other (See Comments)    REACTION: aching  . Lisinopril  REACTION: cough  . Sulfa Antibiotics Hives  . Sulfur Rash    Social History   Social History  . Marital status: Married    Spouse name: N/A  . Number of children: N/A  . Years of education: N/A   Occupational History  . Retired    Social History Main Topics  . Smoking status: Never Smoker  . Smokeless tobacco: Never Used  . Alcohol use No  . Drug use: No  . Sexual activity: Yes   Other Topics Concern  . Not on file   Social History Narrative  . No narrative on file    Family History  Problem Relation Age of Onset  . Breast cancer Sister   . Emphysema Father     smoked  . Allergies Sister   . Allergies Brother   . Heart disease Mother   . Heart disease Father   . Diabetes Father   . Diabetes Brother   . Stroke Father   . Heart failure Neg Hx     BP 127/89   Pulse (!) 53   Ht '5\' 5"'$  (1.651 m)   Wt 230 lb (104.3 kg)   BMI 38.27 kg/m   Review of Systems: See HPI above.    Objective:  Physical Exam:  Gen: NAD, comfortable in exam room  Bilateral knees: No gross deformity, ecchymoses, effusions. Minimal TTP medial joint lines. FROM. Negative ant/post drawers. Negative valgus/varus testing. Negative lachmanns. Negative mcmurrays, apleys. NV intact distally.    Assessment & Plan:  1. Bilateral knee pain - 2/2 DJD.   S/p injections, doing physical therapy and improving well.  Discussed tylenol, glucosamine, topical medications and asking nephrologist to double check if these are ok.  Heat/ice, continue with cane.  Consider viscosupplementation.

## 2016-07-01 ENCOUNTER — Ambulatory Visit: Payer: Medicare Other | Admitting: Physical Therapy

## 2016-07-01 DIAGNOSIS — M25561 Pain in right knee: Secondary | ICD-10-CM

## 2016-07-01 DIAGNOSIS — R2689 Other abnormalities of gait and mobility: Secondary | ICD-10-CM | POA: Diagnosis not present

## 2016-07-01 DIAGNOSIS — R262 Difficulty in walking, not elsewhere classified: Secondary | ICD-10-CM

## 2016-07-01 DIAGNOSIS — M25562 Pain in left knee: Secondary | ICD-10-CM

## 2016-07-01 NOTE — Therapy (Signed)
[facility] [address]  [place], [place], [address] Phone: [phone]   Fax:  [phone]  Physical Therapy Treatment  Patient Details  Name: Susan Pittman MRN: [id number] Date of Birth: 06-13-1941 Referring Provider: Dene Gentry, MD  Encounter Date: 07/01/2016      PT End of Session - 07/01/16 1452    Visit Number 11   Number of Visits 16   Date for PT Re-Evaluation 08/02/16   PT Start Time 1452   PT Stop Time 1530   PT Time Calculation (min) 38 min   Activity Tolerance Patient tolerated treatment well   Behavior During Therapy [facility] for tasks assessed/performed      Past Medical History:  Diagnosis Date  . Allergy   . Arthritis    "knees, hips, back, hands" (11/08/2013)  . Asthma   . Basal cell carcinoma    "cut them off face & right shoulder" (11/08/2013)  . CAD (coronary artery disease), non obstructive by cardiac cath 12/12/13 01/24/2014  . CKD (chronic kidney disease), stage III   . Diastolic CHF ([place])   . DJD (degenerative joint disease)   . Gastric ulcer   . GERD (gastroesophageal reflux disease)   . Hiatal hernia   . Hyperlipidemia    "don't take RX for it" (11/08/2013)  . Hypertension   . Hypothyroid   . Lung mass    superior segment LLL/notes 11/08/2013  . Lung nodule/ adenoca > LLobectomy 12/15/13  11/08/2013   PET 11/23/2013 1. Dominant sub solid left lower lobe nodule does not demonstrate significantly increased metabolic activity. However, morphologically, adenocarcinoma is still a concern.   - Spirometry 11/24/13 wnl  - 11/24/2013 T surg eval rec> LLower lobectomy 12/15/2013 for adenoca   . Nephropathy   . Osteoporosis   . Pneumonia    "I've had it ?3 times" (11/08/2013)  . Renal artery stenosis ([place])    with stent placement    Past Surgical History:  Procedure Laterality Date  . ANTERIOR CERVICAL DECOMP/DISCECTOMY FUSION  2000   C5-C6  . CARDIAC CATHETERIZATION     Hx: of 2/ 2015  .  CHOLECYSTECTOMY  1970's  . CORONARY ANGIOGRAM     Hx: of 2/ 2015  . DILATION AND CURETTAGE OF UTERUS  1960's  . KNEE ARTHROSCOPY Bilateral   . LEFT HEART CATHETERIZATION WITH CORONARY ANGIOGRAM N/A 12/13/2013   Procedure: LEFT HEART CATHETERIZATION WITH CORONARY ANGIOGRAM;  Surgeon: Burnell Blanks, MD;  Location: [facility] CATH LAB;  Service: Cardiovascular;  Laterality: N/A;  . LOBECTOMY Left 12/15/2013   Procedure: LOBECTOMY;  Surgeon: Grace Isaac, MD;  Location: [place];  Service: Thoracic;  Laterality: Left;  . PERCUTANEOUS STENT INTERVENTION Right 11/08/2013   Procedure: PERCUTANEOUS STENT INTERVENTION;  Surgeon: Lorretta Harp, MD;  Location: [facility] CATH LAB;  Service: Cardiovascular;  Laterality: Right;  rt renal artery stent  . RENAL ANGIOGRAM Bilateral 11/08/2013   Procedure: RENAL ANGIOGRAM;  Surgeon: Lorretta Harp, MD;  Location: [facility] CATH LAB;  Service: Cardiovascular;  Laterality: Bilateral;  . RENAL ARTERY STENT Right 11/08/2013   Archie Endo 11/08/2013  . SKIN CANCER EXCISION    . TONSILLECTOMY AND ADENOIDECTOMY  ?1946  . VAGINAL HYSTERECTOMY  1970  . VIDEO ASSISTED THORACOSCOPY (VATS)/WEDGE RESECTION Left 12/15/2013   Procedure: VIDEO ASSISTED THORACOSCOPY (VATS)/WEDGE RESECTION;  Surgeon: Grace Isaac, MD;  Location: [place];  Service: Thoracic;  Laterality: Left;  Marland Kitchen VIDEO BRONCHOSCOPY N/A 12/15/2013   Procedure:  VIDEO BRONCHOSCOPY;  Surgeon: Grace Isaac, MD;  Location: [facility] OR;  Service: Thoracic;  Laterality: N/A;    There were no vitals filed for this visit.      Subjective Assessment - 07/01/16 1452    Subjective Pt reports she saw Dr. Barbaraann Barthel last week and he suggested she wean down to 1x/wk with PT as she transitions to HEP.   Patient Stated Goals "To be able to walk w/o my cane"   Currently in Pain? Yes   Pain Score 4    Pain Location Knee   Pain Orientation Left            [facility] PT Assessment - 07/01/16 1452      Assessment   Medical Diagnosis Gait  instability; B knee OA   Referring Provider Dene Gentry, MD   Onset Date/Surgical Date --  ~2 yrs   Next MD Visit 2-3 months     Prior Function   Level of Independence Independent with household mobility without device;Independent with community mobility with device;Needs assistance with homemaking   Vocation Retired   Leisure Gym for rec bike (daily) & chair yoga (2x/wk)     AROM   AROM Assessment Site Knee   Right Knee Extension 0   Left Knee Extension 0     Strength   Strength Assessment Site Hip;Knee;Ankle   Right Hip Flexion 4/5   Right Hip Extension 4-/5   Right Hip ABduction 4/5   Right Hip ADduction 4/5   Left Hip Flexion 4/5   Left Hip Extension 4-/5   Left Hip ABduction 4-/5   Left Hip ADduction 4-/5   Right Knee Flexion 4/5   Right Knee Extension 4+/5   Left Knee Flexion 4/5   Left Knee Extension 4/5   Right Ankle Dorsiflexion 5/5   Right Ankle Inversion 5/5   Right Ankle Eversion 4+/5   Left Ankle Dorsiflexion 5/5   Left Ankle Inversion 5/5   Left Ankle Eversion 4+/5     Standardized Balance Assessment   Standardized Balance Assessment Berg Balance Test;Timed Up and Go Test;10 meter walk test   10 Meter Walk 2.17 ft/sec  15.10" w/o AD; 2.09 ft/sec - 15.72" with SPC     Berg Balance Test   Sit to Stand Able to stand without using hands and stabilize independently   Standing Unsupported Able to stand safely 2 minutes   Sitting with Back Unsupported but Feet Supported on Floor or Stool Able to sit safely and securely 2 minutes   Stand to Sit Sits safely with minimal use of hands   Transfers Able to transfer safely, minor use of hands   Standing Unsupported with Eyes Closed Able to stand 10 seconds safely   Standing Ubsupported with Feet Together Able to place feet together independently and stand 1 minute safely   From Standing, Reach Forward with Outstretched Arm Can reach confidently >25 cm (10")   From Standing Position, Pick up Object from Floor  Able to pick up shoe safely and easily   From Standing Position, Turn to Look Behind Over each Shoulder Looks behind from both sides and weight shifts well   Turn 360 Degrees Able to turn 360 degrees safely in 4 seconds or less   Standing Unsupported, Alternately Place Feet on Step/Stool Able to stand independently and safely and complete 8 steps in 20 seconds   Standing Unsupported, One Foot in Front Able to place foot tandem independently and hold 30 seconds   Standing  on One Leg Tries to lift leg/unable to hold 3 seconds but remains standing independently   Total Score 53     Timed Up and Go Test   TUG Normal TUG   Normal TUG (seconds) 13.19  without AD; 13.31" with SPC          Today's Treatment  TherEx NuStep - level 7x 5 min    PPT ROM MMT Berg TUG 10 MWT/gait speed          PT Short Term Goals - 06/06/16 1526      PT SHORT TERM GOAL #1   Title Independent with initial HEP by 06/07/16   Status Achieved           PT Long Term Goals - 07/01/16 1509      PT LONG TERM GOAL #1   Title Independent with advanced HEP as indicated by 08/02/16   Status On-going     PT LONG TERM GOAL #2   Title B hip strength >/= 4/5 for improved gait stability by 08/02/16   Status Partially Met  Met except B hip extension & L hip ABD/ADD 4-/5     PT LONG TERM GOAL #3   Title Pt will complete TUG in </= 13.5 sec to decrease risk for falls by 07/14/16   Status Achieved  Normal TUG = 13.10" w/o AD, 13.31" w/ SPC     PT LONG TERM GOAL #4   Title Pt will demonstrate gait speed >/= 2.6 ft/sec to allow for improved safety with community ambulation by 08/02/16   Status On-going  2.17 ft/sec w/o AD, 2.09 ft/sec with SPC     PT LONG TERM GOAL #5   Title Pt will ambulate community distances w/o SPC w/o evidence of trendelenburg gait pattern by 08/02/16   Status Partially Met  Pt reports only using cane when going out, but typically walking around indoors w/o AD. Trendelenburg  pattern more evident w/o AD.               Plan - 07/01/16 1530    Clinical Impression Statement Pt noting improvement with PT with decreasing need for use of cane when ambulating indoors in familiar places, but continues to utilize cane when going out. B knee pain had improved for a period, but has recently been experiencing some intermittent L knee pain, currently 4/10. Strength has improved throughout B LE's with much less descrepancy noted L vs R, although R hip remains ~1/2 grade weaker in abd/adduction. This weakness still evident with tredelenburg gait pattern when ambulating w/o AD, but has become less pronounced when ambulating with SPC. Pt has met TUG goal but remaining goals only partially met or ongoing, therefore will recert for additional 4 weeks at 1 x/wk beginning next week to address remaining deficits and transition to HEP.   Rehab Potential Good   PT Frequency 1x / week   PT Duration 4 weeks   PT Treatment/Interventions Patient/family education;Therapeutic exercise;Neuromuscular re-education;Gait training;Manual techniques;Taping;Electrical Stimulation;Moist Heat;Cryotherapy;Vasopneumatic Device;Therapeutic activities;ADLs/Self Care Home Management   PT Next Visit Plan Core/hip/knee strenthening - emphasis on CKC and SLS activities; Piriformis stretching; Balance training with progression to dynamic gait activities as strength allows   Consulted and Agree with Plan of Care Patient      Patient will benefit from skilled therapeutic intervention in order to improve the following deficits and impairments:     Visit Diagnosis: Difficulty in walking, not elsewhere classified  Other abnormalities of gait and mobility  Pain in left  knee  Pain in right knee     Problem List Patient Active Problem List   Diagnosis Date Noted  . Bilateral knee pain 05/02/2016  . Trochanteric bursitis of left hip 01/11/2016  . Low back pain 10/02/2015  . Anxiety state 06/28/2015  .  Fatigue 06/28/2015  . Shingles 11/24/2014  . Tremor of both hands 09/21/2014  . Gout due to renal impairment 08/30/2014  . Pedal edema 08/23/2014  . Physical deconditioning 06/23/2014  . Leukocytosis 06/13/2014  . UTI (lower urinary tract infection) 06/13/2014  . CKD (chronic kidney disease) stage 3, GFR 30-59 ml/min 05/27/2014  . IgA nephropathy 05/27/2014  . Chronic diastolic heart failure ([place]) 05/03/2014  . Dyspnea 04/23/2014  . Steroid-induced diabetes ([place]) 04/18/2014  . Acute bronchitis 04/18/2014  . Obesity (BMI 30-39.9) 04/12/2014  . Hematuria 02/25/2014  . Diastolic dysfunction- grade 2 02/16/2014  . DOE (dyspnea on exertion) 02/11/2014  . Leg edema 02/11/2014  . Edema of both legs 02/04/2014  . CAD (coronary artery disease), non obstructive by cardiac cath 12/12/13 01/24/2014  . SOB (shortness of breath) 01/18/2014  . CKD (chronic kidney disease) stage 4, GFR 15-29 ml/min ([facility]) 01/03/2014  . Lung cancer, left lower lobe 12/17/2013  . Status post lobectomy of lung 12/15/2013  . Chest pain with moderate risk of acute coronary syndrome 12/12/2013  . Family history of coronary artery bypass surgery 12/12/2013  . Renal artery atherosclerosis, s/p right RA stent 11/08/13 11/08/2013  . Lung nodule/ adenoca > LLobectomy 12/15/13  11/08/2013  . Premature atrial contractions 10/31/2013  . Hearing loss secondary to cerumen impaction 09/20/2013  . Allergic rhinitis 02/11/2013  . Blepharitis of both eyes 02/08/2013  . Shoulder pain 03/11/2012  . Upper arm pain 01/26/2012  . General medical examination 07/25/2011  . Obesity- BMI 41 05/10/2011  . Dizziness 04/10/2011  . Hyperlipidemia 02/28/2011  . EDEMA- LOCALIZED 06/04/2010  . OSTEOPENIA 01/15/2010  . KNEE PAIN 11/10/2009  . CARCINOMA, BASAL CELL 09/22/2009  . SKIN LESION 09/18/2009  . ESOPHAGITIS 05/19/2009  . GASTRIC ULCER 05/19/2009  . HIATAL HERNIA 05/19/2009  . DEGENERATIVE JOINT DISEASE 05/19/2009  . Hypothyroidism  03/23/2009  . Essential hypertension 03/23/2009  . ASTHMA 03/23/2009  . GERD 03/23/2009  . COLONIC POLYPS, HX OF 03/23/2009    Percival Spanish, PT, MPT 07/01/2016, 7:28 PM  [facility] [address]  [place], [place], [address] Phone: [phone]   Fax:  [phone]  Name: NICHOEL DIGIULIO MRN: [id number] Date of Birth: 1941-10-31

## 2016-07-03 ENCOUNTER — Ambulatory Visit: Payer: Medicare Other

## 2016-07-03 DIAGNOSIS — R2689 Other abnormalities of gait and mobility: Secondary | ICD-10-CM | POA: Diagnosis not present

## 2016-07-03 DIAGNOSIS — R262 Difficulty in walking, not elsewhere classified: Secondary | ICD-10-CM

## 2016-07-03 DIAGNOSIS — M25562 Pain in left knee: Secondary | ICD-10-CM

## 2016-07-03 DIAGNOSIS — M25561 Pain in right knee: Secondary | ICD-10-CM | POA: Diagnosis not present

## 2016-07-03 NOTE — Therapy (Signed)
[facility] [address]  [place], [place], [address] Phone: [phone]   Fax:  [phone]  Physical Therapy Treatment  Patient Details  Name: Susan Pittman MRN: [id number] Date of Birth: [birth year] Referring Provider: Dene Gentry, MD  Encounter Date: 07/03/2016      PT End of Session - 07/03/16 1506    Visit Number 12   Number of Visits 16   Date for PT Re-Evaluation 08/02/16   PT Start Time 1452   PT Stop Time 1531   PT Time Calculation (min) 39 min   Activity Tolerance Patient tolerated treatment well   Behavior During Therapy [facility] for tasks assessed/performed      Past Medical History:  Diagnosis Date  . Allergy   . Arthritis    "knees, hips, back, hands" (11/08/2013)  . Asthma   . Basal cell carcinoma    "cut them off face & right shoulder" (11/08/2013)  . CAD (coronary artery disease), non obstructive by cardiac cath 12/12/13 01/24/2014  . CKD (chronic kidney disease), stage III   . Diastolic CHF ([place])   . DJD (degenerative joint disease)   . Gastric ulcer   . GERD (gastroesophageal reflux disease)   . Hiatal hernia   . Hyperlipidemia    "don't take RX for it" (11/08/2013)  . Hypertension   . Hypothyroid   . Lung mass    superior segment LLL/notes 11/08/2013  . Lung nodule/ adenoca > LLobectomy 12/15/13  11/08/2013   PET 11/23/2013 1. Dominant sub solid left lower lobe nodule does not demonstrate significantly increased metabolic activity. However, morphologically, adenocarcinoma is still a concern.   - Spirometry 11/24/13 wnl  - 11/24/2013 T surg eval rec> LLower lobectomy 12/15/2013 for adenoca   . Nephropathy   . Osteoporosis   . Pneumonia    "I've had it ?3 times" (11/08/2013)  . Renal artery stenosis ([place])    with stent placement    Past Surgical History:  Procedure Laterality Date  . ANTERIOR CERVICAL DECOMP/DISCECTOMY FUSION  2000   C5-C6  . CARDIAC CATHETERIZATION     Hx: of 2/ 2015  .  CHOLECYSTECTOMY  1970's  . CORONARY ANGIOGRAM     Hx: of 2/ 2015  . DILATION AND CURETTAGE OF UTERUS  1960's  . KNEE ARTHROSCOPY Bilateral   . LEFT HEART CATHETERIZATION WITH CORONARY ANGIOGRAM N/A 12/13/2013   Procedure: LEFT HEART CATHETERIZATION WITH CORONARY ANGIOGRAM;  Surgeon: Burnell Blanks, MD;  Location: [facility] C[facility] LAB;  Service: Cardiovascular;  Laterality: N/A;  . LOBECTOMY Left 12/15/2013   Procedure: LOBECTOMY;  Surgeon: Grace Isaac, MD;  Location: [place];  Service: Thoracic;  Laterality: Left;  . PERCUTANEOUS STENT INTERVENTION Right 11/08/2013   Procedure: PERCUTANEOUS STENT INTERVENTION;  Surgeon: Lorretta Harp, MD;  Location: [facility] CATH LAB;  Service: Cardiovascular;  Laterality: Right;  rt renal artery stent  . RENAL ANGIOGRAM Bilateral 11/08/2013   Procedure: RENAL ANGIOGRAM;  Surgeon: Lorretta Harp, MD;  Location: [facility] CATH LAB;  Service: Cardiovascular;  Laterality: Bilateral;  . RENAL ARTERY STENT Right 11/08/2013   Archie Endo 11/08/2013  . SKIN CANCER EXCISION    . TONSILLECTOMY AND ADENOIDECTOMY  ?1946  . VAGINAL HYSTERECTOMY  1970  . VIDEO ASSISTED THORACOSCOPY (VATS)/WEDGE RESECTION Left 12/15/2013   Procedure: VIDEO ASSISTED THORACOSCOPY (VATS)/WEDGE RESECTION;  Surgeon: Grace Isaac, MD;  Location: [place];  Service: Thoracic;  Laterality: Left;  Marland Kitchen VIDEO BRONCHOSCOPY N/A 12/15/2013   Procedure:  VIDEO BRONCHOSCOPY;  Surgeon: Grace Isaac, MD;  Location: [facility] OR;  Service: Thoracic;  Laterality: N/A;    There were no vitals filed for this visit.      Subjective Assessment - 07/03/16 1504    Subjective Pt. reports she feels that she has seen a good improvement in knee pain over the last few days.  Pain free in B knees today   Patient Stated Goals "To be able to walk w/o my cane"   Currently in Pain? No/denies   Pain Score 0-No pain   Multiple Pain Sites No      Today's treatment:   Therex: Recumbent bike: level 2, 5 min  R sidelying L hip abduction  with 1# 3 x 10 reps  OTAGO balance program review/HEP creation (see bellow)          Balance Exercises - 07/03/16 1534      OTAGO PROGRAM   Hip ABductor 10 reps   Ankle Plantorflexors 20 reps, support   Ankle Dorsiflexors 20 reps, support   Backwards Walking Support   Walking and Turning Around Assistive device   Sideways Walking No assistive device   Tandem Stance 10 seconds, support   Tandem Walk Support   One Leg Stand 10 seconds, support             PT Short Term Goals - 06/06/16 1526      PT SHORT TERM GOAL #1   Title Independent with initial HEP by 06/07/16   Status Achieved           PT Long Term Goals - 07/01/16 1509      PT LONG TERM GOAL #1   Title Independent with advanced HEP as indicated by 08/02/16   Status On-going     PT LONG TERM GOAL #2   Title B hip strength >/= 4/5 for improved gait stability by 08/02/16   Status Partially Met  Met except B hip extension & L hip ABD/ADD 4-/5     PT LONG TERM GOAL #3   Title Pt will complete TUG in </= 13.5 sec to decrease risk for falls by 07/14/16   Status Achieved  Normal TUG = 13.10" w/o AD, 13.31" w/ SPC     PT LONG TERM GOAL #4   Title Pt will demonstrate gait speed >/= 2.6 ft/sec to allow for improved safety with community ambulation by 08/02/16   Status On-going  2.17 ft/sec w/o AD, 2.09 ft/sec with SPC     PT LONG TERM GOAL #5   Title Pt will ambulate community distances w/o SPC w/o evidence of trendelenburg gait pattern by 08/02/16   Status Partially Met  Pt reports only using cane when going out, but typically walking around indoors w/o AD. Trendelenburg pattern more evident w/o AD.               Plan - 07/03/16 1506    Clinical Impression Statement Pt. reporting a good improvement in knee pain over the last few days.  Pain free in B knees today.  Today's treatment focused on review and HEP creation of OTAGO balance program.  Pt. issued 9 OTAGO balance activities and instructed to  alternating performing these and original HEP activities in coming week.  Pt. performed very well today and left therapy without knee pain.     PT Treatment/Interventions Patient/family education;Therapeutic exercise;Neuromuscular re-education;Gait training;Manual techniques;Taping;Electrical Stimulation;Moist Heat;Cryotherapy;Vasopneumatic Device;Therapeutic activities;ADLs/Self Care Home Management      Patient will benefit from skilled therapeutic intervention  in order to improve the following deficits and impairments:  Pain, Decreased strength, Difficulty walking, Abnormal gait, Decreased balance, Decreased activity tolerance, Decreased endurance, Decreased range of motion  Visit Diagnosis: Difficulty in walking, not elsewhere classified  Other abnormalities of gait and mobility  Pain in left knee  Pain in right knee     Problem List Patient Active Problem List   Diagnosis Date Noted  . Bilateral knee pain 05/02/2016  . Trochanteric bursitis of left hip 01/11/2016  . Low back pain 10/02/2015  . Anxiety state 06/28/2015  . Fatigue 06/28/2015  . Shingles 11/24/2014  . Tremor of both hands 09/21/2014  . Gout due to renal impairment 08/30/2014  . Pedal edema 08/23/2014  . Physical deconditioning 06/23/2014  . Leukocytosis 06/13/2014  . UTI (lower urinary tract infection) 06/13/2014  . CKD (chronic kidney disease) stage 3, GFR 30-59 ml/min 05/27/2014  . IgA nephropathy 05/27/2014  . Chronic diastolic heart failure ([place]) 05/03/2014  . Dyspnea 04/23/2014  . Steroid-induced diabetes ([place]) 04/18/2014  . Acute bronchitis 04/18/2014  . Obesity (BMI 30-39.9) 04/12/2014  . Hematuria 02/25/2014  . Diastolic dysfunction- grade 2 02/16/2014  . DOE (dyspnea on exertion) 02/11/2014  . Leg edema 02/11/2014  . Edema of both legs 02/04/2014  . CAD (coronary artery disease), non obstructive by cardiac cath 12/12/13 01/24/2014  . SOB (shortness of breath) 01/18/2014  . CKD (chronic kidney  disease) stage 4, GFR 15-29 ml/min (H[facility]) 01/03/2014  . Lung cancer, left lower lobe 12/17/2013  . Status post lobectomy of lung 12/15/2013  . Chest pain with moderate risk of acute coronary syndrome 12/12/2013  . Family history of coronary artery bypass surgery 12/12/2013  . Renal artery atherosclerosis, s/p right RA stent 11/08/13 11/08/2013  . Lung nodule/ adenoca > LLobectomy 12/15/13  11/08/2013  . Premature atrial contractions 10/31/2013  . Hearing loss secondary to cerumen impaction 09/20/2013  . Allergic rhinitis 02/11/2013  . Blepharitis of both eyes 02/08/2013  . Shoulder pain 03/11/2012  . Upper arm pain 01/26/2012  . General medical examination 07/25/2011  . Obesity- BMI 41 05/10/2011  . Dizziness 04/10/2011  . Hyperlipidemia 02/28/2011  . EDEMA- LOCALIZED 06/04/2010  . OSTEOPENIA 01/15/2010  . KNEE PAIN 11/10/2009  . CARCINOMA, BASAL CELL 09/22/2009  . SKIN LESION 09/18/2009  . ESOPHAGITIS 05/19/2009  . GASTRIC ULCER 05/19/2009  . HIATAL HERNIA 05/19/2009  . DEGENERATIVE JOINT DISEASE 05/19/2009  . Hypothyroidism 03/23/2009  . Essential hypertension 03/23/2009  . ASTHMA 03/23/2009  . GERD 03/23/2009  . COLONIC POLYPS, HX OF 03/23/2009    Bess Harvest, PTA 07/03/2016, 6:26 PM  [facility] [address]  [place], [place], [address] Phone: [phone]   Fax:  [phone]  Name: YAKIRA DUQUETTE MRN: [id number] Date of Birth: [birth year]

## 2016-07-10 ENCOUNTER — Ambulatory Visit: Payer: Medicare Other | Attending: Family Medicine

## 2016-07-10 DIAGNOSIS — M25561 Pain in right knee: Secondary | ICD-10-CM | POA: Insufficient documentation

## 2016-07-10 DIAGNOSIS — M25562 Pain in left knee: Secondary | ICD-10-CM | POA: Diagnosis not present

## 2016-07-10 DIAGNOSIS — M6281 Muscle weakness (generalized): Secondary | ICD-10-CM | POA: Diagnosis not present

## 2016-07-10 DIAGNOSIS — R262 Difficulty in walking, not elsewhere classified: Secondary | ICD-10-CM | POA: Diagnosis not present

## 2016-07-10 DIAGNOSIS — R2689 Other abnormalities of gait and mobility: Secondary | ICD-10-CM | POA: Diagnosis not present

## 2016-07-10 NOTE — Therapy (Signed)
[facility] [address]  [place], [place], [address] Phone: [phone]   Fax:  [phone]  Physical Therapy Treatment  Patient Details  Name: Susan Pittman MRN: [id number] Date of Birth: 03-Jun-1941 Referring Provider: Dene Gentry, MD  Encounter Date: 07/10/2016      PT End of Session - 07/10/16 1506    Visit Number 13   Number of Visits 16   Date for PT Re-Evaluation 08/02/16   PT Start Time 6962   PT Stop Time 1530   PT Time Calculation (min) 41 min   Activity Tolerance Patient tolerated treatment well   Behavior During Therapy [facility] for tasks assessed/performed      Past Medical History:  Diagnosis Date  . Allergy   . Arthritis    "knees, hips, back, hands" (11/08/2013)  . Asthma   . Basal cell carcinoma    "cut them off face & right shoulder" (11/08/2013)  . CAD (coronary artery disease), non obstructive by cardiac cath 12/12/13 01/24/2014  . CKD (chronic kidney disease), stage III   . Diastolic CHF ([place])   . DJD (degenerative joint disease)   . Gastric ulcer   . GERD (gastroesophageal reflux disease)   . Hiatal hernia   . Hyperlipidemia    "don't take RX for it" (11/08/2013)  . Hypertension   . Hypothyroid   . Lung mass    superior segment LLL/notes 11/08/2013  . Lung nodule/ adenoca > LLobectomy 12/15/13  11/08/2013   PET 11/23/2013 1. Dominant sub solid left lower lobe nodule does not demonstrate significantly increased metabolic activity. However, morphologically, adenocarcinoma is still a concern.   - Spirometry 11/24/13 wnl  - 11/24/2013 T surg eval rec> LLower lobectomy 12/15/2013 for adenoca   . Nephropathy   . Osteoporosis   . Pneumonia    "I've had it ?3 times" (11/08/2013)  . Renal artery stenosis ([place])    with stent placement    Past Surgical History:  Procedure Laterality Date  . ANTERIOR CERVICAL DECOMP/DISCECTOMY FUSION  2000   C5-C6  . CARDIAC CATHETERIZATION     Hx: of 2/ 2015  .  CHOLECYSTECTOMY  1970's  . CORONARY ANGIOGRAM     Hx: of 2/ 2015  . DILATION AND CURETTAGE OF UTERUS  1960's  . KNEE ARTHROSCOPY Bilateral   . LEFT HEART CATHETERIZATION WITH CORONARY ANGIOGRAM N/A 12/13/2013   Procedure: LEFT HEART CATHETERIZATION WITH CORONARY ANGIOGRAM;  Surgeon: Burnell Blanks, MD;  Location: [facility] CATH LAB;  Service: Cardiovascular;  Laterality: N/A;  . LOBECTOMY Left 12/15/2013   Procedure: LOBECTOMY;  Surgeon: Grace Isaac, MD;  Location: [place];  Service: Thoracic;  Laterality: Left;  . PERCUTANEOUS STENT INTERVENTION Right 11/08/2013   Procedure: PERCUTANEOUS STENT INTERVENTION;  Surgeon: Lorretta Harp, MD;  Location[facility] CATH LAB;  Service: Cardiovascular;  Laterality: Right;  rt renal artery stent  . RENAL ANGIOGRAM Bilateral 11/08/2013   Procedure: RENAL ANGIOGRAM;  Surgeon: Lorretta Harp, MD;  [facility] CATH LAB;  Service: Cardiovascular;  Laterality: Bilateral;  . RENAL ARTERY STENT Right 11/08/2013   Archie Endo 11/08/2013  . SKIN CANCER EXCISION    . TONSILLECTOMY AND ADENOIDECTOMY  ?1946  . VAGINAL HYSTERECTOMY  1970  . VIDEO ASSISTED THORACOSCOPY (VATS)/WEDGE RESECTION Left 12/15/2013   Procedure: VIDEO ASSISTED THORACOSCOPY (VATS)/WEDGE RESECTION;  Surgeon: Grace Isaac, MD;  Location: [place];  Service: Thoracic;  Laterality: Left;  Marland Kitchen VIDEO BRONCHOSCOPY N/A 12/15/2013   Procedure:  VIDEO BRONCHOSCOPY;  Surgeon: Grace Isaac, MD;  Location: [facility] OR;  Service: Thoracic;  Laterality: N/A;    There were no vitals filed for this visit.      Subjective Assessment - 07/10/16 1534    Subjective Pt. reports the trip to [place], [place] was touch on the knees and that they have had more pain since this trip.     Patient Stated Goals "To be able to walk w/o my cane"   Currently in Pain? Yes   Pain Score 2    Pain Location Knee   Pain Orientation Right;Left   Pain Descriptors / Indicators Constant   Pain Type Chronic pain   Pain Onset More than a month  ago   Pain Frequency Intermittent   Multiple Pain Sites No      Today's treatment:   Therex (pt. reporting increased B knee pain today due to trip to [place], [place]): Recumbent bike: level 2, 5 min   B HS, glute, SKTC stretch x 30 sec each Hooklying bridge x 15 reps  Hooklying bridge with B hip abd/ER with black TB x 15 reps  B clam shell with black TB x 15 reps  Gait training:  Long step to rubber circles on gym track with SPC and close CGA 3 x 2 laps to increase step length; seated rest breaks taken between laps   Neuro re-ed: Alternating toe-touch to plastic cone x 10 reps each side; with SPC Alternating cross-over toe-touch to plastic cone x 10 reps each side; with SPC  Cone nock over/put back with toe x 16 cones; down/ back in line; with SPC            PT Short Term Goals - 06/06/16 1526      PT SHORT TERM GOAL #1   Title Independent with initial HEP by 06/07/16   Status Achieved           PT Long Term Goals - 07/01/16 1509      PT LONG TERM GOAL #1   Title Independent with advanced HEP as indicated by 08/02/16   Status On-going     PT LONG TERM GOAL #2   Title B hip strength >/= 4/5 for improved gait stability by 08/02/16   Status Partially Met  Met except B hip extension & L hip ABD/ADD 4-/5     PT LONG TERM GOAL #3   Title Pt will complete TUG in </= 13.5 sec to decrease risk for falls by 07/14/16   Status Achieved  Normal TUG = 13.10" w/o AD, 13.31" w/ SPC     PT LONG TERM GOAL #4   Title Pt will demonstrate gait speed >/= 2.6 ft/sec to allow for improved safety with community ambulation by 08/02/16   Status On-going  2.17 ft/sec w/o AD, 2.09 ft/sec with SPC     PT LONG TERM GOAL #5   Title Pt will ambulate community distances w/o SPC w/o evidence of trendelenburg gait pattern by 08/02/16   Status Partially Met  Pt reports only using cane when going out, but typically walking around indoors w/o AD. Trendelenburg pattern more evident w/o AD.                Plan - 07/10/16 1507    Clinical Impression Statement   Pt. reports the trip to [place], [place] caused significant B knee pain and she wasn't able to do much walking during this trip.  Pt. also reports not performing HEP but a few  times since this trip.  Pt. reporting today is the first day the B knees have felt better with less pain.  Today's treatment focused on dynamic gait/balance activities which were tolerated very well.  Pt. ending treatment today with only B knee discomfort and reports she will attempt OTAGO balance HEP before next visit.     PT Treatment/Interventions Patient/family education;Therapeutic exercise;Neuromuscular re-education;Gait training;Manual techniques;Taping;Electrical Stimulation;Moist Heat;Cryotherapy;Vasopneumatic Device;Therapeutic activities;ADLs/Self Care Home Management   PT Next Visit Plan Core/hip/knee strenthening - emphasis on CKC and SLS activities; Piriformis stretching; Balance training with progression to dynamic gait activities as strength allows      Patient will benefit from skilled therapeutic intervention in order to improve the following deficits and impairments:  Pain, Decreased strength, Difficulty walking, Abnormal gait, Decreased balance, Decreased activity tolerance, Decreased endurance, Decreased range of motion  Visit Diagnosis: Difficulty in walking, not elsewhere classified  Other abnormalities of gait and mobility  Pain in left knee  Pain in right knee     Problem List Patient Active Problem List   Diagnosis Date Noted  . Bilateral knee pain 05/02/2016  . Trochanteric bursitis of left hip 01/11/2016  . Low back pain 10/02/2015  . Anxiety state 06/28/2015  . Fatigue 06/28/2015  . Shingles 11/24/2014  . Tremor of both hands 09/21/2014  . Gout due to renal impairment 08/30/2014  . Pedal edema 08/23/2014  . Physical deconditioning 06/23/2014  . Leukocytosis 06/13/2014  . UTI (lower urinary tract infection)  06/13/2014  . CKD (chronic kidney disease) stage 3, GFR 30-59 ml/min 05/27/2014  . IgA nephropathy 05/27/2014  . Chronic diastolic heart failure ([place]) 05/03/2014  . Dyspnea 04/23/2014  . Steroid-induced diabetes ([place]) 04/18/2014  . Acute bronchitis 04/18/2014  . Obesity (BMI 30-39.9) 04/12/2014  . Hematuria 02/25/2014  . Diastolic dysfunction- grade 2 02/16/2014  . DOE (dyspnea on exertion) 02/11/2014  . Leg edema 02/11/2014  . Edema of both legs 02/04/2014  . CAD (coronary artery disease), non obstructive by cardiac cath 12/12/13 01/24/2014  . SOB (shortness of breath) 01/18/2014  . CKD (chronic kidney disease) stage 4, GFR 15-29 ml/min (HCC) 01/03/2014  . Lung cancer, left lower lobe 12/17/2013  . Status post lobectomy of lung 12/15/2013  . Chest pain with moderate risk of acute coronary syndrome 12/12/2013  . Family history of coronary artery bypass surgery 12/12/2013  . Renal artery atherosclerosis, s/p right RA stent 11/08/13 11/08/2013  . Lung nodule/ adenoca > LLobectomy 12/15/13  11/08/2013  . Premature atrial contractions 10/31/2013  . Hearing loss secondary to cerumen impaction 09/20/2013  . Allergic rhinitis 02/11/2013  . Blepharitis of both eyes 02/08/2013  . Shoulder pain 03/11/2012  . Upper arm pain 01/26/2012  . General medical examination 07/25/2011  . Obesity- BMI 41 05/10/2011  . Dizziness 04/10/2011  . Hyperlipidemia 02/28/2011  . EDEMA- LOCALIZED 06/04/2010  . OSTEOPENIA 01/15/2010  . KNEE PAIN 11/10/2009  . CARCINOMA, BASAL CELL 09/22/2009  . SKIN LESION 09/18/2009  . ESOPHAGITIS 05/19/2009  . GASTRIC ULCER 05/19/2009  . HIATAL HERNIA 05/19/2009  . DEGENERATIVE JOINT DISEASE 05/19/2009  . Hypothyroidism 03/23/2009  . Essential hypertension 03/23/2009  . ASTHMA 03/23/2009  . GERD 03/23/2009  . COLONIC POLYPS, HX OF 03/23/2009    Bess Harvest, PTA 07/10/2016, 6:32 PM  [facility] [address]   [place], [place], [address] Phone: [phone]   Fax:  [phone]  Name: ARLENNE KIMBLEY MRN: [id number] Date of Birth: 1941-02-23

## 2016-07-17 ENCOUNTER — Ambulatory Visit: Payer: Medicare Other

## 2016-07-17 DIAGNOSIS — M25562 Pain in left knee: Secondary | ICD-10-CM

## 2016-07-17 DIAGNOSIS — M25561 Pain in right knee: Secondary | ICD-10-CM | POA: Diagnosis not present

## 2016-07-17 DIAGNOSIS — R262 Difficulty in walking, not elsewhere classified: Secondary | ICD-10-CM

## 2016-07-17 DIAGNOSIS — M6281 Muscle weakness (generalized): Secondary | ICD-10-CM | POA: Diagnosis not present

## 2016-07-17 DIAGNOSIS — R2689 Other abnormalities of gait and mobility: Secondary | ICD-10-CM | POA: Diagnosis not present

## 2016-07-17 LAB — HM DIABETES EYE EXAM

## 2016-07-17 NOTE — Therapy (Signed)
[facility] [address]  [place], [place], [address] Phone: [phone]   Fax:  [phone]  Physical Therapy Treatment  Patient Details  Name: Susan Pittman MRN: [id number] Date of Birth: [birth year] Referring Provider: Dene Gentry, MD  Encounter Date: 07/17/2016      PT End of Session - 07/17/16 1500    Visit Number 14   Number of Visits 16   Date for PT Re-Evaluation 08/02/16   PT Start Time 1448   PT Stop Time 1530   PT Time Calculation (min) 42 min   Activity Tolerance Patient tolerated treatment well   Behavior During Therapy [facility] for tasks assessed/performed      Past Medical History:  Diagnosis Date  . Allergy   . Arthritis    "knees, hips, back, hands" (11/08/2013)  . Asthma   . Basal cell carcinoma    "cut them off face & right shoulder" (11/08/2013)  . CAD (coronary artery disease), non obstructive by cardiac cath 12/12/13 01/24/2014  . CKD (chronic kidney disease), stage III   . Diastolic CHF ([place])   . DJD (degenerative joint disease)   . Gastric ulcer   . GERD (gastroesophageal reflux disease)   . Hiatal hernia   . Hyperlipidemia    "don't take RX for it" (11/08/2013)  . Hypertension   . Hypothyroid   . Lung mass    superior segment LLL/notes 11/08/2013  . Lung nodule/ adenoca > LLobectomy 12/15/13  11/08/2013   PET 11/23/2013 1. Dominant sub solid left lower lobe nodule does not demonstrate significantly increased metabolic activity. However, morphologically, adenocarcinoma is still a concern.   - Spirometry 11/24/13 wnl  - 11/24/2013 T surg eval rec> LLower lobectomy 12/15/2013 for adenoca   . Nephropathy   . Osteoporosis   . Pneumonia    "I've had it ?3 times" (11/08/2013)  . Renal artery stenosis ([place])    with stent placement    Past Surgical History:  Procedure Laterality Date  . ANTERIOR CERVICAL DECOMP/DISCECTOMY FUSION  2000   C5-C6  . CARDIAC CATHETERIZATION     Hx: of 2/ 2015  .  CHOLECYSTECTOMY  1970's  . CORONARY ANGIOGRAM     Hx: of 2/ 2015  . DILATION AND CURETTAGE OF UTERUS  1960's  . KNEE ARTHROSCOPY Bilateral   . LEFT HEART CATHETERIZATION WITH CORONARY ANGIOGRAM N/A 12/13/2013   Procedure: LEFT HEART CATHETERIZATION WITH CORONARY ANGIOGRAM;  Surgeon: Burnell Blanks, MD;  Location: [facility] CATH LAB;  Service: Cardiovascular;  Laterality: N/A;  . LOBECTOMY Left 12/15/2013   Procedure: LOBECTOMY;  Surgeon: Grace Isaac, MD;  Location: [place];  Service: Thoracic;  Laterality: Left;  . PERCUTANEOUS STENT INTERVENTION Right 11/08/2013   Procedure: PERCUTANEOUS STENT INTERVENTION;  Surgeon: Lorretta Harp, MD;  Location: [facility] CATH LAB;  Service: Cardiovascular;  Laterality: Right;  rt renal artery stent  . RENAL ANGIOGRAM Bilateral 11/08/2013   Procedure: RENAL ANGIOGRAM;  Surgeon: Lorretta Harp, MD;  Location: [facility] CATH LAB;  Service: Cardiovascular;  Laterality: Bilateral;  . RENAL ARTERY STENT Right 11/08/2013   Archie Endo 11/08/2013  . SKIN CANCER EXCISION    . TONSILLECTOMY AND ADENOIDECTOMY  ?1946  . VAGINAL HYSTERECTOMY  1970  . VIDEO ASSISTED THORACOSCOPY (VATS)/WEDGE RESECTION Left 12/15/2013   Procedure: VIDEO ASSISTED THORACOSCOPY (VATS)/WEDGE RESECTION;  Surgeon: Grace Isaac, MD;  Location: [place];  Service: Thoracic;  Laterality: Left;  Marland Kitchen VIDEO BRONCHOSCOPY N/A 12/15/2013   Procedure:  VIDEO BRONCHOSCOPY;  Surgeon: Grace Isaac, MD;  Location: [facility] OR;  Service: Thoracic;  Laterality: N/A;    There were no vitals filed for this visit.      Subjective Assessment - 07/17/16 1544    Subjective Pt. reporting that the knees have hurt on and off over the weekend however that they are pain free today and she feels that she is doing well.     Patient Stated Goals "To be able to walk w/o my cane"   Currently in Pain? No/denies   Pain Score 0-No pain   Multiple Pain Sites No      Today's treatment:  Therex: NuStep: level 6, 5 min  Hooklying bridge x  15reps  Hooklying bridge with alternating (2x) hip abd/ER with blackTB x 15reps Hooklying LE march with black TB x 15 reps each; emphasis on going slow on eccentric lowering Hooklying alternating hip abd/ER with blackTB x 15 reps  B sidelying clam shell with black TB x 15reps  Hooklying bridge with sustained B hip abd/ER with black TB x 15 reps  Neuro Re-ed: Alternating lunge onto BOSU ball (up) x 10 reps each side; 2 pole support  Alternating toe-touch on robber half ball on 10" step 2 x 5 reps each side Alternating cross-over toe-touch to plastic cone x 16 reps each side; with SPC  Cone nock over/put back with toe x 16 cones; down/ back in line; with SPC    Gait training:  Long step to rubber circles on gym track with [facility] and close CGA 2 x 3 laps to increase step length         PT Short Term Goals - 06/06/16 1526      PT SHORT TERM GOAL #1   Title Independent with initial HEP by 06/07/16   Status Achieved           PT Long Term Goals - 07/01/16 1509      PT LONG TERM GOAL #1   Title Independent with advanced HEP as indicated by 08/02/16   Status On-going     PT LONG TERM GOAL #2   Title B hip strength >/= 4/5 for improved gait stability by 08/02/16   Status Partially Met  Met except B hip extension & L hip ABD/ADD 4-/5     PT LONG TERM GOAL #3   Title Pt will complete TUG in </= 13.5 sec to decrease risk for falls by 07/14/16   Status Achieved  Normal TUG = 13.10" w/o AD, 13.31" w/ SPC     PT LONG TERM GOAL #4   Title Pt will demonstrate gait speed >/= 2.6 ft/sec to allow for improved safety with community ambulation by 08/02/16   Status On-going  2.17 ft/sec w/o AD, 2.09 ft/sec with SPC     PT LONG TERM GOAL #5   Title Pt will ambulate community distances w/o SPC w/o evidence of trendelenburg gait pattern by 08/02/16   Status Partially Met  Pt reports only using cane when going out, but typically walking around indoors w/o AD. Trendelenburg pattern more  evident w/o AD.               Plan - 07/17/16 1508    Clinical Impression Statement Pt. pain free in B knees today with all dynamic gait and balance activities.  Today's balance activities focused on increasing step length and hip/knee flexion, DF.  Pt. balance and LE clearance seem to be improving and her step length is increasing  at this point.  Pt. reporting she is working on increased step length at home as well.  Pt. continues to perform water aerobics and chair yoga consistently along with lumbopelvic strengthening HEP and OTAGO HEP.  Pt. with two more treatments left in POC.     PT Treatment/Interventions Patient/family education;Therapeutic exercise;Neuromuscular re-education;Gait training;Manual techniques;Taping;Electrical Stimulation;Moist Heat;Cryotherapy;Vasopneumatic Device;Therapeutic activities;ADLs/Self Care Home Management   PT Next Visit Plan Core/hip/knee strenthening - emphasis on CKC and SLS activities; Piriformis stretching; Balance training with progression to dynamic gait activities as strength allows      Patient will benefit from skilled therapeutic intervention in order to improve the following deficits and impairments:  Pain, Decreased strength, Difficulty walking, Abnormal gait, Decreased balance, Decreased activity tolerance, Decreased endurance, Decreased range of motion  Visit Diagnosis: Difficulty in walking, not elsewhere classified  Other abnormalities of gait and mobility  Pain in left knee  Pain in right knee     Problem List Patient Active Problem List   Diagnosis Date Noted  . Bilateral knee pain 05/02/2016  . Trochanteric bursitis of left hip 01/11/2016  . Low back pain 10/02/2015  . Anxiety state 06/28/2015  . Fatigue 06/28/2015  . Shingles 11/24/2014  . Tremor of both hands 09/21/2014  . Gout due to renal impairment 08/30/2014  . Pedal edema 08/23/2014  . Physical deconditioning 06/23/2014  . Leukocytosis 06/13/2014  . UTI  (lower urinary tract infection) 06/13/2014  . CKD (chronic kidney disease) stage 3, GFR 30-59 ml/min 05/27/2014  . IgA nephropathy 05/27/2014  . Chronic diastolic heart failure ([place]) 05/03/2014  . Dyspnea 04/23/2014  . Steroid-induced diabetes ([place]) 04/18/2014  . Acute bronchitis 04/18/2014  . Obesity (BMI 30-39.9) 04/12/2014  . Hematuria 02/25/2014  . Diastolic dysfunction- grade 2 02/16/2014  . DOE (dyspnea on exertion) 02/11/2014  . Leg edema 02/11/2014  . Edema of both legs 02/04/2014  . CAD (coronary artery disease), non obstructive by cardiac cath 12/12/13 01/24/2014  . SOB (shortness of breath) 01/18/2014  . CKD (chronic kidney disease) stage 4, GFR 15-29 ml/min ([facility]) 01/03/2014  . Lung cancer, left lower lobe 12/17/2013  . Status post lobectomy of lung 12/15/2013  . Chest pain with moderate risk of acute coronary syndrome 12/12/2013  . Family history of coronary artery bypass surgery 12/12/2013  . Renal artery atherosclerosis, s/p right RA stent 11/08/13 11/08/2013  . Lung nodule/ adenoca > LLobectomy 12/15/13  11/08/2013  . Premature atrial contractions 10/31/2013  . Hearing loss secondary to cerumen impaction 09/20/2013  . Allergic rhinitis 02/11/2013  . Blepharitis of both eyes 02/08/2013  . Shoulder pain 03/11/2012  . Upper arm pain 01/26/2012  . General medical examination 07/25/2011  . Obesity- BMI 41 05/10/2011  . Dizziness 04/10/2011  . Hyperlipidemia 02/28/2011  . EDEMA- LOCALIZED 06/04/2010  . OSTEOPENIA 01/15/2010  . KNEE PAIN 11/10/2009  . CARCINOMA, BASAL CELL 09/22/2009  . SKIN LESION 09/18/2009  . ESOPHAGITIS 05/19/2009  . GASTRIC ULCER 05/19/2009  . HIATAL HERNIA 05/19/2009  . DEGENERATIVE JOINT DISEASE 05/19/2009  . Hypothyroidism 03/23/2009  . Essential hypertension 03/23/2009  . ASTHMA 03/23/2009  . GERD 03/23/2009  . COLONIC POLYPS, HX OF 03/23/2009    Bess Harvest, PTA 07/17/2016, 3:50 PM  [facility] [address]  [place], [place], [address] Phone: [phone]   Fax:  [phone]  Name: Susan Pittman MRN: [id number] Date of Birth: 06-Nov-1940

## 2016-07-19 ENCOUNTER — Encounter: Payer: Self-pay | Admitting: Family Medicine

## 2016-07-19 ENCOUNTER — Ambulatory Visit (INDEPENDENT_AMBULATORY_CARE_PROVIDER_SITE_OTHER): Payer: Medicare Other | Admitting: Family Medicine

## 2016-07-19 VITALS — BP 126/50 | HR 56 | Temp 98.2°F | Ht 65.0 in | Wt 234.2 lb

## 2016-07-19 DIAGNOSIS — Z23 Encounter for immunization: Secondary | ICD-10-CM | POA: Diagnosis not present

## 2016-07-19 DIAGNOSIS — I701 Atherosclerosis of renal artery: Secondary | ICD-10-CM

## 2016-07-19 DIAGNOSIS — M10372 Gout due to renal impairment, left ankle and foot: Secondary | ICD-10-CM

## 2016-07-19 MED ORDER — PREDNISONE 20 MG PO TABS: ORAL_TABLET | ORAL | 0 refills | Status: DC

## 2016-07-19 NOTE — Progress Notes (Signed)
Pre visit review using our clinic review tool, if applicable. No additional management support is needed unless otherwise documented below in the visit note. 

## 2016-07-19 NOTE — Progress Notes (Signed)
Musculoskeletal Exam  Patient: Susan Pittman DOB: 05/25/1941  DOS: 07/19/2016  SUBJECTIVE:  Chief Complaint:   Chief Complaint  Patient presents with  . Toe Pain    (L) great toes-pain and swelling-started on yest-pt has a history of gout    Susan Pittman is a 75 y.o.  female for evaluation and treatment of L great toe pain.   Onset:  1 day ago. Sudden.  Location: Bottom of foot, MTP Character:  sharp Progression of issue:  About the same Associated symptoms: None Treatment: to date has been none.   Neurovascular symptoms: no +Hx of gout, same spot as last exacerbation  ROS: Musculoskeletal/Extremities: +Toe pain Neurologic: no numbness, tingling no weakness   Past Medical History:  Diagnosis Date  . Allergy   . Arthritis    "knees, hips, back, hands" (11/08/2013)  . Asthma   . Basal cell carcinoma    "cut them off face & right shoulder" (11/08/2013)  . CAD (coronary artery disease), non obstructive by cardiac cath 12/12/13 01/24/2014  . CKD (chronic kidney disease), stage III   . Diastolic CHF ([place])   . DJD (degenerative joint disease)   . Gastric ulcer   . GERD (gastroesophageal reflux disease)   . Hiatal hernia   . Hyperlipidemia    "don't take RX for it" (11/08/2013)  . Hypertension   . Hypothyroid   . Lung mass    superior segment LLL/notes 11/08/2013  . Lung nodule/ adenoca > LLobectomy 12/15/13  11/08/2013   PET 11/23/2013 1. Dominant sub solid left lower lobe nodule does not demonstrate significantly increased metabolic activity. However, morphologically, adenocarcinoma is still a concern.   - Spirometry 11/24/13 wnl  - 11/24/2013 T surg eval rec> LLower lobectomy 12/15/2013 for adenoca   . Nephropathy   . Osteoporosis   . Pneumonia    "I've had it ?3 times" (11/08/2013)  . Renal artery stenosis ([place])    with stent placement   Past Surgical History:  Procedure Laterality Date  . ANTERIOR CERVICAL DECOMP/DISCECTOMY FUSION  2000   C5-C6  . CARDIAC CATHETERIZATION      Hx: of 2/ 2015  . CHOLECYSTECTOMY  1970's  . CORONARY ANGIOGRAM     Hx: of 2/ 2015  . DILATION AND CURETTAGE OF UTERUS  1960's  . KNEE ARTHROSCOPY Bilateral   . LEFT HEART CATHETERIZATION WITH CORONARY ANGIOGRAM N/A 12/13/2013   Procedure: LEFT HEART CATHETERIZATION WITH CORONARY ANGIOGRAM;  Surgeon: Burnell Blanks, MD;  Location: [facility] CATH LAB;  Service: Cardiovascular;  Laterality: N/A;  . LOBECTOMY Left 12/15/2013   Procedure: LOBECTOMY;  Surgeon: Grace Isaac, MD;  Location: [place];  Service: Thoracic;  Laterality: Left;  . PERCUTANEOUS STENT INTERVENTION Right 11/08/2013   Procedure: PERCUTANEOUS STENT INTERVENTION;  Surgeon: Lorretta Harp, MD;  Location: [facility] CATH LAB;  Service: Cardiovascular;  Laterality: Right;  rt renal artery stent  . RENAL ANGIOGRAM Bilateral 11/08/2013   Procedure: RENAL ANGIOGRAM;  Surgeon: Lorretta Harp, MD;  Location: [facility] CATH LAB;  Service: Cardiovascular;  Laterality: Bilateral;  . RENAL ARTERY STENT Right 11/08/2013   Archie Endo 11/08/2013  . SKIN CANCER EXCISION    . TONSILLECTOMY AND ADENOIDECTOMY  ?1946  . VAGINAL HYSTERECTOMY  1970  . VIDEO ASSISTED THORACOSCOPY (VATS)/WEDGE RESECTION Left 12/15/2013   Procedure: VIDEO ASSISTED THORACOSCOPY (VATS)/WEDGE RESECTION;  Surgeon: Grace Isaac, MD;  Location: [place];  Service: Thoracic;  Laterality: Left;  Marland Kitchen VIDEO BRONCHOSCOPY N/A 12/15/2013   Procedure: VIDEO BRONCHOSCOPY;  Surgeon: Grace Isaac, MD;  Location[facility] OR;  Service: Thoracic;  Laterality: N/A;   Family History  Problem Relation Age of Onset  . Breast cancer Sister   . Emphysema Father     smoked  . Allergies Sister   . Allergies Brother   . Heart disease Mother   . Heart disease Father   . Diabetes Father   . Diabetes Brother   . Stroke Father   . Heart failure Neg Hx    Current Outpatient Prescriptions  Medication Sig Dispense Refill  . albuterol (PROVENTIL HFA;VENTOLIN HFA) 108 (90 BASE) MCG/ACT inhaler Inhale 1-2 puffs  into the lungs every 6 (six) hours as needed for wheezing or shortness of breath. 18 g 1  . allopurinol (ZYLOPRIM) 100 MG tablet Take 100 mg by mouth 2 (two) times daily. Takes 2 tablets in am    . benzonatate (TESSALON) 200 MG capsule Take 1 capsule (200 mg total) by mouth 2 (two) times daily as needed for cough. 60 capsule 0  . BYSTOLIC 20 MG TABS TAKE ONE TABLET BY MOUTH ONCE DAILY 30 tablet 6  . Calcium Carb-Cholecalciferol 600-800 MG-UNIT TABS Take 2 tablets by mouth daily.     . cetirizine (ZYRTEC) 10 MG tablet Take 10 mg by mouth at bedtime.     . fluticasone (FLONASE) 50 MCG/ACT nasal spray USE TWO SPRAY(S) IN EACH NOSTRIL ONCE DAILY 16 g 2  . furosemide (LASIX) 40 MG tablet TAKE ONE TABLET BY MOUTH IN THE EVENING UNTIL  WEIGHT  OF  219  IS  REACHED 90 tablet 3  . furosemide (LASIX) 80 MG tablet TAKE ONE TABLET BY MOUTH IN THE MORNING 90 tablet 0  . glucose blood (ONE TOUCH ULTRA TEST) test strip Dx E09.9. Please use one strip each time sugars are checked. Pt tests 2 times daily. 100 each 6  . hydrALAZINE (APRESOLINE) 50 MG tablet TAKE ONE TABLET BY MOUTH TWICE DAILY 60 tablet 6  . Lancets (ONETOUCH ULTRASOFT) lancets Use 1 per day dx code 249.00 100 each 12  . lansoprazole (PREVACID) 15 MG capsule Take 15 mg by mouth daily at 12 noon.    Marland Kitchen levothyroxine (SYNTHROID, LEVOTHROID) 88 MCG tablet TAKE ONE TABLET BY MOUTH ONCE DAILY BEFORE  BREAKFAST 90 tablet 0  . ONETOUCH DELICA LANCETS 53I MISC Use to check blood sugar once daily as instructed. Dx code: E11.9 100 each 2  . sertraline (ZOLOFT) 50 MG tablet TAKE ONE & ONE-HALF TABLETS BY MOUTH ONCE DAILY 45 tablet 6  . predniSONE (DELTASONE) 20 MG tablet 60 mg for 3 days, 40 mg for 3 days, 20 mg for 3 days 18 tablet 0   Allergies  Allergen Reactions  . Benazepril Hcl Anaphylaxis and Cough  . Loratadine Anaphylaxis    anaphylaxis  . Fexofenadine Other (See Comments)    Severe back pain  . Allegra [Fexofenadine Hcl] Other (See Comments)     Severe back pain  . Celecoxib Other (See Comments)    REACTION: aching  . Lisinopril     REACTION: cough  . Sulfa Antibiotics Hives  . Sulfur Rash   Social History   Social History  . Marital status: Married   Occupational History  . Retired    Social History Main Topics  . Smoking status: Never Smoker  . Smokeless tobacco: Never Used  . Alcohol use No  . Drug use: No  . Sexual activity: Yes   Objective:  VITAL SIGNS: BP (!) 126/50 (BP Location:  Left Arm, Patient Position: Sitting, Cuff Size: Large)   Pulse (!) 56   Temp 98.2 F (36.8 C) (Oral)   Ht '5\' 5"'$  (1.651 m)   Wt 234 lb 3.2 oz (106.2 kg)   SpO2 99%   BMI 38.97 kg/m  Constitutional: Well formed, well developed. No acute distress. HENT: Normocephalic, atraumatic.  Moist mucous membranes.  Cardiovascular: RRR, no murmurs, no LE edema Thorax & Lungs:  CTAB, no wheezing or rales, normal effort without accessory muscle use Extremities: No clubbing. No cyanosis. No edema.  Skin: Warm. Dry. No erythema. No rash.  Musculoskeletal: L 1st MTP.   Tenderness to palpation: yes- plantar side Deformity: no Ecchymosis: no Neurologic: Normal sensory function.  Psychiatric: Normal mood. Age appropriate judgment and insight. Alert & oriented x 3.    Assessment:  Acute gout due to renal impairment involving left foot - Plan: predniSONE (DELTASONE) 20 MG tablet  Encounter for immunization - Plan: Flu vaccine HIGH DOSE PF  Plan: Orders as above. Reviewed renal function. Continue Allopurinol. If she has another attack, recheck uric acid to make sure we are at goal.  If symptoms fail to improve despite pred taper, will obtain XR and work up [employer] cause. The patient voiced understanding and agreement to the plan.   [place], DO 07/19/16  2:38 PM

## 2016-07-19 NOTE — Patient Instructions (Signed)
Mind foods like lunch meats, seafood, alcohol, and sweetened drinks.

## 2016-07-23 ENCOUNTER — Ambulatory Visit: Payer: Medicare Other | Admitting: Physical Therapy

## 2016-07-23 DIAGNOSIS — M25561 Pain in right knee: Secondary | ICD-10-CM

## 2016-07-23 DIAGNOSIS — R2689 Other abnormalities of gait and mobility: Secondary | ICD-10-CM

## 2016-07-23 DIAGNOSIS — M25562 Pain in left knee: Secondary | ICD-10-CM

## 2016-07-23 DIAGNOSIS — R262 Difficulty in walking, not elsewhere classified: Secondary | ICD-10-CM

## 2016-07-23 DIAGNOSIS — M6281 Muscle weakness (generalized): Secondary | ICD-10-CM | POA: Diagnosis not present

## 2016-07-23 NOTE — Therapy (Signed)
[facility] [address]  [place], [place], [address] Phone: [phone]   Fax:  [phone]  Physical Therapy Treatment  Patient Details  Name: Susan Pittman MRN: [id number] Date of Birth: September 06, 1941 Referring Provider: Dene Gentry, MD  Encounter Date: 07/23/2016      PT End of Session - 07/23/16 1318    Visit Number 15   Number of Visits 16   Date for PT Re-Evaluation 08/02/16   PT Start Time 2440   PT Stop Time 1359   PT Time Calculation (min) 41 min   Activity Tolerance Patient tolerated treatment well   Behavior During Therapy [facility] for tasks assessed/performed      Past Medical History:  Diagnosis Date  . Allergy   . Arthritis    "knees, hips, back, hands" (11/08/2013)  . Asthma   . Basal cell carcinoma    "cut them off face & right shoulder" (11/08/2013)  . CAD (coronary artery disease), non obstructive by cardiac cath 12/12/13 01/24/2014  . CKD (chronic kidney disease), stage III   . Diastolic CHF ([place])   . DJD (degenerative joint disease)   . Gastric ulcer   . GERD (gastroesophageal reflux disease)   . Hiatal hernia   . Hyperlipidemia    "don't take RX for it" (11/08/2013)  . Hypertension   . Hypothyroid   . Lung mass    superior segment LLL/notes 11/08/2013  . Lung nodule/ adenoca > LLobectomy 12/15/13  11/08/2013   PET 11/23/2013 1. Dominant sub solid left lower lobe nodule does not demonstrate significantly increased metabolic activity. However, morphologically, adenocarcinoma is still a concern.   - Spirometry 11/24/13 wnl  - 11/24/2013 T surg eval rec> LLower lobectomy 12/15/2013 for adenoca   . Nephropathy   . Osteoporosis   . Pneumonia    "I've had it ?3 times" (11/08/2013)  . Renal artery stenosis ([place])    with stent placement    Past Surgical History:  Procedure Laterality Date  . ANTERIOR CERVICAL DECOMP/DISCECTOMY FUSION  2000   C5-C6  . CARDIAC CATHETERIZATION     Hx: of 2/ 2015  .  CHOLECYSTECTOMY  1970's  . CORONARY ANGIOGRAM     Hx: of 2/ 2015  . DILATION AND CURETTAGE OF UTERUS  1960's  . KNEE ARTHROSCOPY Bilateral   . LEFT HEART CATHETERIZATION WITH CORONARY ANGIOGRAM N/A 12/13/2013   Procedure: LEFT HEART CATHETERIZATION WITH CORONARY ANGIOGRAM;  Surgeon: Burnell Blanks, MD;  Location: [facility] CATH LAB;  Service: Cardiovascular;  Laterality: N/A;  . LOBECTOMY Left 12/15/2013   Procedure: LOBECTOMY;  Surgeon: Grace Isaac, MD;  Location: [place];  Service: Thoracic;  Laterality: Left;  . PERCUTANEOUS STENT INTERVENTION Right 11/08/2013   Procedure: PERCUTANEOUS STENT INTERVENTION;  Surgeon: Lorretta Harp, MD;  Location: [facility] CATH LAB;  Service: Cardiovascular;  Laterality: Right;  rt renal artery stent  . RENAL ANGIOGRAM Bilateral 11/08/2013   Procedure: RENAL ANGIOGRAM;  Surgeon: Lorretta Harp, MD;  Location: [facility] CATH LAB;  Service: Cardiovascular;  Laterality: Bilateral;  . RENAL ARTERY STENT Right 11/08/2013   Archie Endo 11/08/2013  . SKIN CANCER EXCISION    . TONSILLECTOMY AND ADENOIDECTOMY  ?1946  . VAGINAL HYSTERECTOMY  1970  . VIDEO ASSISTED THORACOSCOPY (VATS)/WEDGE RESECTION Left 12/15/2013   Procedure: VIDEO ASSISTED THORACOSCOPY (VATS)/WEDGE RESECTION;  Surgeon: Grace Isaac, MD;  Location: [facility];  Service: Thoracic;  Laterality: Left;  Marland Kitchen VIDEO BRONCHOSCOPY N/A 12/15/2013   Procedure:  VIDEO BRONCHOSCOPY;  Surgeon: Grace Isaac, MD;  Location: [facility] OR;  Service: Thoracic;  Laterality: N/A;    There were no vitals filed for this visit.      Subjective Assessment - 07/23/16 1321    Subjective Pt reporting she was started back on 9 day course of prednisone for flare-up of gout and states it is making her feel more edgy but denies any pain at present due to prednisone.   Patient Stated Goals "To be able to walk w/o my cane"   Currently in Pain? No/denies          Today's treatment  TherEx NuStep - lvl 7 x 5' B Standing 4 way Hip SLR with red  TB x10, UE support on back of chair B 4" SL Fwd Step-up + Opp hip extension x10 B 4" SL Lat Step-up + Opp hip abduction x10 B Sidestepping along counter in slight squat with yellow TB with intermittent UE support x4  Gait training 100 ft each with & w/o SPC with emphasis on longer stride length and level pelvis - trendelenburg fairly well compensated with use of cane, but still very pronounced w/o AD          PT Education - 07/23/16 1359    Education provided Yes   Education Details Red TB provided for 4 way SLR & yellow TB provided for side-stepping   Person(s) Educated Patient   Methods Explanation;Demonstration   Comprehension Verbalized understanding;Returned demonstration          PT Short Term Goals - 06/06/16 1526      PT SHORT TERM GOAL #1   Title Independent with initial HEP by 06/07/16   Status Achieved           PT Long Term Goals - 07/01/16 1509      PT LONG TERM GOAL #1   Title Independent with advanced HEP as indicated by 08/02/16   Status On-going     PT LONG TERM GOAL #2   Title B hip strength >/= 4/5 for improved gait stability by 08/02/16   Status Partially Met  Met except B hip extension & L hip ABD/ADD 4-/5     PT LONG TERM GOAL #3   Title Pt will complete TUG in </= 13.5 sec to decrease risk for falls by 07/14/16   Status Achieved  Normal TUG = 13.10" w/o AD, 13.31" w/ SPC     PT LONG TERM GOAL #4   Title Pt will demonstrate gait speed >/= 2.6 ft/sec to allow for improved safety with community ambulation by 08/02/16   Status On-going  2.17 ft/sec w/o AD, 2.09 ft/sec with SPC     PT LONG TERM GOAL #5   Title Pt will ambulate community distances w/o SPC w/o evidence of trendelenburg gait pattern by 08/02/16   Status Partially Met  Pt reports only using cane when going out, but typically walking around indoors w/o AD. Trendelenburg pattern more evident w/o AD.               Plan - 07/23/16 1401    Clinical Impression Statement Pt  mdiway through predinsone taper from recent gout flare-up, so not currently experiencing any pain but states was still having B knee pain up until prednisone started. Pt more consistent with increased step length, but still demonstrating trendelenburg gait pattern which is partially compensated for with use of SPC but very evident w/o AD. Progressed HEP standing 4 way SLR with addition of red  TB and added yellow TB loop for resisted side stepping. Pt with 1 visit remaining in existing POC which will occur after steroid dosing is completed, therefore will assess status at that time to determine readiness for D/C vs need for further recert.   PT Treatment/Interventions Patient/family education;Therapeutic exercise;Neuromuscular re-education;Gait training;Manual techniques;Taping;Electrical Stimulation;Moist Heat;Cryotherapy;Vasopneumatic Device;Therapeutic activities;ADLs/Self Care Home Management   PT Next Visit Plan Recert vs D/C assessment   Consulted and Agree with Plan of Care Patient      Patient will benefit from skilled therapeutic intervention in order to improve the following deficits and impairments:  Pain, Decreased strength, Difficulty walking, Abnormal gait, Decreased balance, Decreased activity tolerance, Decreased endurance, Decreased range of motion  Visit Diagnosis: Difficulty in walking, not elsewhere classified  Other abnormalities of gait and mobility  Pain in left knee  Pain in right knee     Problem List Patient Active Problem List   Diagnosis Date Noted  . Bilateral knee pain 05/02/2016  . Trochanteric bursitis of left hip 01/11/2016  . Low back pain 10/02/2015  . Tremor of both hands 09/21/2014  . Gout due to renal impairment 08/30/2014  . Physical deconditioning 06/23/2014  . UTI (lower urinary tract infection) 06/13/2014  . CKD (chronic kidney disease) stage 3, GFR 30-59 ml/min 05/27/2014  . IgA nephropathy 05/27/2014  . Chronic diastolic heart failure ([place])  05/03/2014  . Steroid-induced diabetes ([place]) 04/18/2014  . Hematuria 02/25/2014  . Diastolic dysfunction- grade 2 02/16/2014  . Leg edema 02/11/2014  . CAD (coronary artery disease), non obstructive by cardiac cath 12/12/13 01/24/2014  . CKD (chronic kidney disease) stage 4, GFR 15-29 ml/min ([facility]) 01/03/2014  . Lung cancer, left lower lobe 12/17/2013  . Status post lobectomy of lung 12/15/2013  . Chest pain with moderate risk of acute coronary syndrome 12/12/2013  . Family history of coronary artery bypass surgery 12/12/2013  . Renal artery atherosclerosis, s/p right RA stent 11/08/13 11/08/2013  . Lung nodule/ adenoca > LLobectomy 12/15/13  11/08/2013  . Premature atrial contractions 10/31/2013  . Hearing loss secondary to cerumen impaction 09/20/2013  . Allergic rhinitis 02/11/2013  . Shoulder pain 03/11/2012  . Upper arm pain 01/26/2012  . General medical examination 07/25/2011  . Obesity- BMI 41 05/10/2011  . Dizziness 04/10/2011  . Hyperlipidemia 02/28/2011  . EDEMA- LOCALIZED 06/04/2010  . OSTEOPENIA 01/15/2010  . CARCINOMA, BASAL CELL 09/22/2009  . ESOPHAGITIS 05/19/2009  . GASTRIC ULCER 05/19/2009  . HIATAL HERNIA 05/19/2009  . DEGENERATIVE JOINT DISEASE 05/19/2009  . Hypothyroidism 03/23/2009  . Essential hypertension 03/23/2009  . ASTHMA 03/23/2009  . GERD 03/23/2009  . COLONIC POLYPS, HX OF 03/23/2009    Percival Spanish, PT, MPT 07/23/2016, 7:00 PM  [facility] [address]  [place], [place], [address] Phone: [phone]   Fax:  [phone]  Name: DONNIKA KUCHER MRN: [id number] Date of Birth: September 04, 1941

## 2016-07-30 ENCOUNTER — Ambulatory Visit: Payer: Medicare Other | Admitting: Physical Therapy

## 2016-07-30 DIAGNOSIS — R262 Difficulty in walking, not elsewhere classified: Secondary | ICD-10-CM

## 2016-07-30 DIAGNOSIS — M25561 Pain in right knee: Secondary | ICD-10-CM | POA: Diagnosis not present

## 2016-07-30 DIAGNOSIS — M6281 Muscle weakness (generalized): Secondary | ICD-10-CM | POA: Diagnosis not present

## 2016-07-30 DIAGNOSIS — R2689 Other abnormalities of gait and mobility: Secondary | ICD-10-CM

## 2016-07-30 DIAGNOSIS — M25562 Pain in left knee: Secondary | ICD-10-CM | POA: Diagnosis not present

## 2016-07-30 NOTE — Therapy (Signed)
[facility] [address]  [place], [place], [address] Phone: [phone]   Fax:  [phone]  Physical Therapy Treatment  Patient Details  Name: Susan Pittman MRN: [id number] Date of Birth: 1941-05-03 Referring Provider: Dene Gentry, MD  Encounter Date: 07/30/2016      PT End of Session - 07/30/16 1447    Visit Number 16   Number of Visits 26   Date for PT Re-Evaluation 09/24/16   Authorization Type Medicare primary   PT Start Time 1447   PT Stop Time 1531   PT Time Calculation (min) 44 min   Activity Tolerance Patient tolerated treatment well   Behavior During Therapy [facility] for tasks assessed/performed      Past Medical History:  Diagnosis Date  . Allergy   . Arthritis    "knees, hips, back, hands" (11/08/2013)  . Asthma   . Basal cell carcinoma    "cut them off face & right shoulder" (11/08/2013)  . CAD (coronary artery disease), non obstructive by cardiac cath 12/12/13 01/24/2014  . CKD (chronic kidney disease), stage III   . Diastolic CHF ([place])   . DJD (degenerative joint disease)   . Gastric ulcer   . GERD (gastroesophageal reflux disease)   . Hiatal hernia   . Hyperlipidemia    "don't take RX for it" (11/08/2013)  . Hypertension   . Hypothyroid   . Lung mass    superior segment LLL/notes 11/08/2013  . Lung nodule/ adenoca > LLobectomy 12/15/13  11/08/2013   PET 11/23/2013 1. Dominant sub solid left lower lobe nodule does not demonstrate significantly increased metabolic activity. However, morphologically, adenocarcinoma is still a concern.   - Spirometry 11/24/13 wnl  - 11/24/2013 T surg eval rec> LLower lobectomy 12/15/2013 for adenoca   . Nephropathy   . Osteoporosis   . Pneumonia    "I've had it ?3 times" (11/08/2013)  . Renal artery stenosis ([place])    with stent placement    Past Surgical History:  Procedure Laterality Date  . ANTERIOR CERVICAL DECOMP/DISCECTOMY FUSION  2000   C5-C6  . CARDIAC  CATHETERIZATION     Hx: of 2/ 2015  . CHOLECYSTECTOMY  1970's  . CORONARY ANGIOGRAM     Hx: of 2/ 2015  . DILATION AND CURETTAGE OF UTERUS  1960's  . KNEE ARTHROSCOPY Bilateral   . LEFT HEART CATHETERIZATION WITH CORONARY ANGIOGRAM N/A 12/13/2013   Procedure: LEFT HEART CATHETERIZATION WITH CORONARY ANGIOGRAM;  Surgeon: Burnell Blanks, MD;  Location: [facility] CATH LAB;  Service: Cardiovascular;  Laterality: N/A;  . LOBECTOMY Left 12/15/2013   Procedure: LOBECTOMY;  Surgeon: Grace Isaac, MD;  Location: [facility];  Service: Thoracic;  Laterality: Left;  . PERCUTANEOUS STENT INTERVENTION Right 11/08/2013   Procedure: PERCUTANEOUS STENT INTERVENTION;  Surgeon: Lorretta Harp, MD;  Location: [facility] CATH LAB;  Service: Cardiovascular;  Laterality: Right;  rt renal artery stent  . RENAL ANGIOGRAM Bilateral 11/08/2013   Procedure: RENAL ANGIOGRAM;  Surgeon: Lorretta Harp, MD;  Location: [facility] CATH LAB;  Service: Cardiovascular;  Laterality: Bilateral;  . RENAL ARTERY STENT Right 11/08/2013   Archie Endo 11/08/2013  . SKIN CANCER EXCISION    . TONSILLECTOMY AND ADENOIDECTOMY  ?1946  . VAGINAL HYSTERECTOMY  1970  . VIDEO ASSISTED THORACOSCOPY (VATS)/WEDGE RESECTION Left 12/15/2013   Procedure: VIDEO ASSISTED THORACOSCOPY (VATS)/WEDGE RESECTION;  Surgeon: Grace Isaac, MD;  Location: [place];  Service: Thoracic;  Laterality: Left;  Marland Kitchen VIDEO  BRONCHOSCOPY N/A 12/15/2013   Procedure: VIDEO BRONCHOSCOPY;  Surgeon: Grace Isaac, MD;  Location: [facility] OR;  Service: Thoracic;  Laterality: N/A;    There were no vitals filed for this visit.      Subjective Assessment - 07/30/16 1447    Subjective Pt reporting that she remains painfree since being on the prednisone, but notes increased weakness and fatigue since the prednisone. Feels like therapy has helped and would like to continue for a while longer to work on strengthening and balance.   Patient Stated Goals "To be able to walk w/o my cane"   Currently in Pain?  No/denies            [facility] PT Assessment - 07/30/16 1447      Assessment   Medical Diagnosis Gait instability; B knee OA   Referring Provider Dene Gentry, MD   Onset Date/Surgical Date --  ~2 yrs   Next MD Visit 2-3 months     Prior Function   Level of Independence Needs assistance with homemaking;Independent   Vocation Retired   Chief Strategy Officer for rec bike (daily) & chair yoga (2x/wk)     Single Leg Stance   Comments R hip drop with SLS on L     AROM   AROM Assessment Site Knee   Right Knee Extension 0   Left Knee Extension 0     Strength   Strength Assessment Site Hip;Knee;Ankle   Right Hip Flexion 4-/5   Right Hip Extension 4-/5   Right Hip ABduction 4/5   Right Hip ADduction 4-/5   Left Hip Flexion 4-/5   Left Hip Extension 4-/5   Left Hip ABduction 4-/5  trendelenburg drop on R in L SLS    Left Hip ADduction 4-/5   Right Knee Flexion 4/5   Right Knee Extension 4+/5   Left Knee Flexion 4/5   Left Knee Extension 4+/5   Right Ankle Dorsiflexion 5/5   Right Ankle Plantar Flexion 4+/5   Right Ankle Inversion 5/5   Right Ankle Eversion 4+/5   Left Ankle Dorsiflexion 5/5   Left Ankle Plantar Flexion 4+/5   Left Ankle Inversion 5/5   Left Ankle Eversion 4+/5     Standardized Balance Assessment   10 Meter Walk 2..52 ft/sec  13.00" w/o AD; 2.60 ft/sec - 12.62" with SPC     Timed Up and Go Test   TUG Normal TUG   Normal TUG (seconds) 12.66  without AD; 12.47" with SPC     Functional Gait  Assessment   Gait assessed  Yes   Gait Level Surface Walks 20 ft, slow speed, abnormal gait pattern, evidence for imbalance or deviates 10-15 in outside of the 12 in walkway width. Requires more than 7 sec to ambulate 20 ft.   Change in Gait Speed Able to change speed, demonstrates mild gait deviations, deviates 6-10 in outside of the 12 in walkway width, or no gait deviations, unable to achieve a major change in velocity, or uses a change in velocity, or uses an assistive  device.   Gait with Horizontal Head Turns Performs head turns with moderate changes in gait velocity, slows down, deviates 10-15 in outside 12 in walkway width but recovers, can continue to walk.   Gait with Vertical Head Turns Performs task with slight change in gait velocity (eg, minor disruption to smooth gait path), deviates 6 - 10 in outside 12 in walkway width or uses assistive device   Gait and Pivot Turn  Pivot turns safely in greater than 3 sec and stops with no loss of balance, or pivot turns safely within 3 sec and stops with mild imbalance, requires small steps to catch balance.   Step Over Obstacle Is able to step over one shoe box (4.5 in total height) without changing gait speed. No evidence of imbalance.   Gait with Narrow Base of Support Ambulates 7-9 steps.   Gait with Eyes Closed Walks 20 ft, slow speed, abnormal gait pattern, evidence for imbalance, deviates 10-15 in outside 12 in walkway width. Requires more than 9 sec to ambulate 20 ft.   Ambulating Backwards Walks 20 ft, slow speed, abnormal gait pattern, evidence for imbalance, deviates 10-15 in outside 12 in walkway width.   Steps Alternating feet, must use rail.   Total Score 16         Today's treatment  TherEx NuStep - lvl 7 x 5'  PPT MMT TUG 10 MWT/ Gait speed FGA  Gait training 100 ft each with & w/o SPC with emphasis on longer stride length and level pelvis - trendelenburg fairly well compensated with use of cane, but still very pronounced w/o AD           PT Short Term Goals - 06/06/16 1526      PT SHORT TERM GOAL #1   Title Independent with initial HEP by 06/07/16   Status Achieved           PT Long Term Goals - 07/30/16 1524      PT LONG TERM GOAL #1   Title Independent with advanced HEP as indicated by 09/24/16   Status On-going     PT LONG TERM GOAL #2   Title B hip strength >/= 4/5 for improved gait stability by 09/24/16   Status Partially Met  Met only for R hip ABD,  remaining hip strength 4-/5     PT LONG TERM GOAL #3   Title Pt will complete TUG in </= 13.5 sec to decrease risk for falls by 07/14/16   Status Achieved     PT LONG TERM GOAL #4   Title Pt will demonstrate gait speed >/= 2.6 ft/sec to allow for improved safety with community ambulation by 09/24/16   Status Partially Met  2.52 ft/sec w/o AD, 2.60 ft/sec with SPC     PT LONG TERM GOAL #5   Title Pt will ambulate community distances w/o SPC w/o evidence of trendelenburg gait pattern by 09/24/16   Status Partially Met  Pt reports increasing need for SPC due to weakness since being on prednisone. Trendelenburg pattern persist, but more evident w/o AD.               Plan - 07/30/16 1531    Clinical Impression Statement Pt noting improvement as a result of PT but feels that recent prednisone dosing for gout has made her weaker and more unsteady on her feet. Pt states she has been painfree since starting prednsone. Strength testing reveals continued weakness in B hips averaging 4-/5 with continued functional trendelenburg on R with L SLS. Trendelenburg also present during gait, worsening when ambulating w/o AD. Dynamic gait assessment with FGA (16/30) reveals high risk for falls. Given continued proximal weakness and instability, recommend recert for additional 4-6 wks, resuming 2x/wk frequency initially due to increased weakness following steroid dosing and tapering back down to 1x/wk.   Rehab Potential Good   Clinical Impairments Affecting Rehab Potential Recent deconditioning due to prednisone dosing for gout, obesity  PT Frequency 2x / week  tapering down to 1x/wk after 3-4/wks   PT Duration 6 weeks   PT Treatment/Interventions Patient/family education;Therapeutic exercise;Neuromuscular re-education;Gait training;Manual techniques;Taping;Electrical Stimulation;Moist Heat;Cryotherapy;Vasopneumatic Device;Therapeutic activities;ADLs/Self Care Home Management   PT Next Visit Plan Continue  PT for Core/hip/knee strenthening - emphasis on CKC and SLS activities; Balance training & dynamic gait activities as strength allows   Consulted and Agree with Plan of Care Patient      Patient will benefit from skilled therapeutic intervention in order to improve the following deficits and impairments:  Pain, Decreased strength, Difficulty walking, Abnormal gait, Decreased balance, Decreased activity tolerance, Decreased endurance, Decreased range of motion  Visit Diagnosis: Difficulty in walking, not elsewhere classified  Other abnormalities of gait and mobility  Muscle weakness (generalized)  Pain in left knee  Pain in right knee     Problem List Patient Active Problem List   Diagnosis Date Noted  . Bilateral knee pain 05/02/2016  . Trochanteric bursitis of left hip 01/11/2016  . Low back pain 10/02/2015  . Tremor of both hands 09/21/2014  . Gout due to renal impairment 08/30/2014  . Physical deconditioning 06/23/2014  . UTI (lower urinary tract infection) 06/13/2014  . CKD (chronic kidney disease) stage 3, GFR 30-59 ml/min 05/27/2014  . IgA nephropathy 05/27/2014  . Chronic diastolic heart failure ([place]) 05/03/2014  . Steroid-induced diabetes ([place]) 04/18/2014  . Hematuria 02/25/2014  . Diastolic dysfunction- grade 2 02/16/2014  . Leg edema 02/11/2014  . CAD (coronary artery disease), non obstructive by cardiac cath 12/12/13 01/24/2014  . CKD (chronic kidney disease) stage 4, GFR 15-29 ml/min (HCC) 01/03/2014  . Lung cancer, left lower lobe 12/17/2013  . Status post lobectomy of lung 12/15/2013  . Chest pain with moderate risk of acute coronary syndrome 12/12/2013  . Family history of coronary artery bypass surgery 12/12/2013  . Renal artery atherosclerosis, s/p right RA stent 11/08/13 11/08/2013  . Lung nodule/ adenoca > LLobectomy 12/15/13  11/08/2013  . Premature atrial contractions 10/31/2013  . Hearing loss secondary to cerumen impaction 09/20/2013  . Allergic  rhinitis 02/11/2013  . Shoulder pain 03/11/2012  . Upper arm pain 01/26/2012  . General medical examination 07/25/2011  . Obesity- BMI 41 05/10/2011  . Dizziness 04/10/2011  . Hyperlipidemia 02/28/2011  . EDEMA- LOCALIZED 06/04/2010  . OSTEOPENIA 01/15/2010  . CARCINOMA, BASAL CELL 09/22/2009  . ESOPHAGITIS 05/19/2009  . GASTRIC ULCER 05/19/2009  . HIATAL HERNIA 05/19/2009  . DEGENERATIVE JOINT DISEASE 05/19/2009  . Hypothyroidism 03/23/2009  . Essential hypertension 03/23/2009  . ASTHMA 03/23/2009  . GERD 03/23/2009  . COLONIC POLYPS, HX OF 03/23/2009    Percival Spanish 07/30/2016, 4:52 PM  [facility] [address]  [place], [place], [address] Phone: [phone]   Fax:  [phone]  Name: ADONIA PORADA MRN: [id number] Date of Birth: May 08, 1941

## 2016-08-20 ENCOUNTER — Ambulatory Visit: Payer: Medicare Other | Admitting: Physical Therapy

## 2016-08-22 ENCOUNTER — Ambulatory Visit (INDEPENDENT_AMBULATORY_CARE_PROVIDER_SITE_OTHER): Payer: Medicare Other | Admitting: Family Medicine

## 2016-08-22 ENCOUNTER — Encounter: Payer: Self-pay | Admitting: Family Medicine

## 2016-08-22 ENCOUNTER — Other Ambulatory Visit: Payer: Self-pay | Admitting: Emergency Medicine

## 2016-08-22 VITALS — BP 130/82 | HR 54 | Temp 98.7°F | Resp 16 | Ht 65.0 in | Wt 233.5 lb

## 2016-08-22 DIAGNOSIS — I701 Atherosclerosis of renal artery: Secondary | ICD-10-CM | POA: Diagnosis not present

## 2016-08-22 DIAGNOSIS — J4521 Mild intermittent asthma with (acute) exacerbation: Secondary | ICD-10-CM

## 2016-08-22 MED ORDER — IPRATROPIUM-ALBUTEROL 0.5-2.5 (3) MG/3ML IN SOLN
3.0000 mL | Freq: Once | RESPIRATORY_TRACT | Status: AC
  Administered 2016-08-22: 3 mL via RESPIRATORY_TRACT

## 2016-08-22 MED ORDER — BECLOMETHASONE DIPROPIONATE 80 MCG/ACT IN AERS: 2.0000 | INHALATION_SPRAY | Freq: Two times a day (BID) | RESPIRATORY_TRACT | 12 refills | Status: DC

## 2016-08-22 NOTE — Progress Notes (Signed)
Pre visit review using our clinic review tool, if applicable. No additional management support is needed unless otherwise documented below in the visit note. 

## 2016-08-22 NOTE — Addendum Note (Signed)
Addended by: Desmond Dike L on: 08/22/2016 11:00 AM   Modules accepted: Orders

## 2016-08-22 NOTE — Progress Notes (Signed)
   Subjective:    Patient ID: Susan Pittman, female    DOB: November 11, 1940, 75 y.o.   MRN: [id number]  HPI URI- pt reports sxs started as seasonal allergies but yesterday she began wheezing.  Today had increased cough.   Subjective low grade fever yesterday.  Pt has been using albuterol inhaler much more regularly since Sunday.  Denies sinus pain/pressure.  + L ear pressure.  Using nasal spray regularly.  Cough is productive.  No N/V.  No known sick contacts.  + hx of asthma.  Pt was recently on oral steroids x9 days due to gout.   Review of Systems For ROS see HPI     Objective:   Physical Exam  Constitutional: She is oriented to person, place, and time. She appears well-developed and well-nourished. No distress.  HENT:  Head: Normocephalic and atraumatic.  TMs normal bilaterally Mild nasal congestion Throat w/out erythema, edema, or exudate  Eyes: Conjunctivae and EOM are normal. Pupils are equal, round, and reactive to light.  Neck: Normal range of motion. Neck supple.  Cardiovascular: Normal rate, regular rhythm, normal heart sounds and intact distal pulses.   No murmur heard. Pulmonary/Chest: Effort normal. No respiratory distress. She has wheezes (end expiratory wheezes diffusely throughout- improved s/p neb tx in office).  + hacking cough  Lymphadenopathy:    She has no cervical adenopathy.  Neurological: She is alert and oriented to person, place, and time.  Skin: Skin is warm and dry.  Psychiatric: She has a normal mood and affect. Her behavior is normal. Thought content normal.  Vitals reviewed.         Assessment & Plan:  Asthma exacerbation- new.  Pt's sxs are not consistent w/ bacterial infxn/trigger and this is most likely a viral/allergy combo triggering her underlying asthma.  Due to her recent poor response to prednisone, 'i went crazy and my husband said he would divorce me if I did it again!', will start Qvar '80mg'$ , 2 puffs twice daily until wheezing has improved.   No need for abx.  Reviewed supportive care and red flags that should prompt return.  Pt expressed understanding and is in agreement w/ plan.

## 2016-08-22 NOTE — Patient Instructions (Signed)
Follow up as needed/scheduled Start the Qvar- 2 puffs twice daily- until feeling better Use the Albuterol as needed for wheezing We will hold off on Prednisone for now, but if your breathing worsens, please let me know as we will have to call in a lower dose taper Use the albuterol as needed for shortness of breath/wheezing Mucinex DM for cough/congestion Call with any questions or concerns Hang in there!!!

## 2016-08-26 ENCOUNTER — Encounter: Payer: Medicare Other | Admitting: Physical Therapy

## 2016-09-02 ENCOUNTER — Ambulatory Visit: Payer: Medicare Other | Attending: Family Medicine

## 2016-09-02 DIAGNOSIS — R2689 Other abnormalities of gait and mobility: Secondary | ICD-10-CM | POA: Diagnosis not present

## 2016-09-02 DIAGNOSIS — R262 Difficulty in walking, not elsewhere classified: Secondary | ICD-10-CM | POA: Insufficient documentation

## 2016-09-02 NOTE — Therapy (Signed)
[facility] [address]  [place], [place], [address] Phone: [phone]   Fax:  [phone]  Physical Therapy Treatment  Patient Details  Name: Susan Pittman MRN: [id number] Date of Birth: 04-Sep-1941 Referring Provider: Dene Gentry, MD  Encounter Date: 09/02/2016      PT End of Session - 09/02/16 1501    Visit Number 17   Number of Visits 26   Date for PT Re-Evaluation 09/24/16   Authorization Type Medicare primary   PT Start Time 1446   PT Stop Time 1527   PT Time Calculation (min) 41 min   Activity Tolerance Patient tolerated treatment well   Behavior During Therapy [facility] for tasks assessed/performed      Past Medical History:  Diagnosis Date  . Allergy   . Arthritis    "knees, hips, back, hands" (11/08/2013)  . Asthma   . Basal cell carcinoma    "cut them off face & right shoulder" (11/08/2013)  . CAD (coronary artery disease), non obstructive by cardiac cath 12/12/13 01/24/2014  . CKD (chronic kidney disease), stage III   . Diastolic CHF ([place])   . DJD (degenerative joint disease)   . Gastric ulcer   . GERD (gastroesophageal reflux disease)   . Hiatal hernia   . Hyperlipidemia    "don't take RX for it" (11/08/2013)  . Hypertension   . Hypothyroid   . Lung mass    superior segment LLL/notes 11/08/2013  . Lung nodule/ adenoca > LLobectomy 12/15/13  11/08/2013   PET 11/23/2013 1. Dominant sub solid left lower lobe nodule does not demonstrate significantly increased metabolic activity. However, morphologically, adenocarcinoma is still a concern.   - Spirometry 11/24/13 wnl  - 11/24/2013 T surg eval rec> LLower lobectomy 12/15/2013 for adenoca   . Nephropathy   . Osteoporosis   . Pneumonia    "I've had it ?3 times" (11/08/2013)  . Renal artery stenosis ([place])    with stent placement    Past Surgical History:  Procedure Laterality Date  . ANTERIOR CERVICAL DECOMP/DISCECTOMY FUSION  2000   C5-C6  . CARDIAC  CATHETERIZATION     Hx: of 2/ 2015  . CHOLECYSTECTOMY  1970's  . CORONARY ANGIOGRAM     Hx: of 2/ 2015  . DILATION AND CURETTAGE OF UTERUS  1960's  . KNEE ARTHROSCOPY Bilateral   . LEFT HEART CATHETERIZATION WITH CORONARY ANGIOGRAM N/A 12/13/2013   Procedure: LEFT HEART CATHETERIZATION WITH CORONARY ANGIOGRAM;  Surgeon: Burnell Blanks, MD;  Location: [facility] CATH LAB;  Service: Cardiovascular;  Laterality: N/A;  . LOBECTOMY Left 12/15/2013   Procedure: LOBECTOMY;  Surgeon: Grace Isaac, MD;  Location: [place];  Service: Thoracic;  Laterality: Left;  . PERCUTANEOUS STENT INTERVENTION Right 11/08/2013   Procedure: PERCUTANEOUS STENT INTERVENTION;  Surgeon: Lorretta Harp, MD;  Location: [facility] CATH LAB;  Service: Cardiovascular;  Laterality: Right;  rt renal artery stent  . RENAL ANGIOGRAM Bilateral 11/08/2013   Procedure: RENAL ANGIOGRAM;  Surgeon: Lorretta Harp, MD;  Location: [facility] CATH LAB;  Service: Cardiovascular;  Laterality: Bilateral;  . RENAL ARTERY STENT Right 11/08/2013   Archie Endo 11/08/2013  . SKIN CANCER EXCISION    . TONSILLECTOMY AND ADENOIDECTOMY  ?1946  . VAGINAL HYSTERECTOMY  1970  . VIDEO ASSISTED THORACOSCOPY (VATS)/WEDGE RESECTION Left 12/15/2013   Procedure: VIDEO ASSISTED THORACOSCOPY (VATS)/WEDGE RESECTION;  Surgeon: Grace Isaac, MD;  Location: [place];  Service: Thoracic;  Laterality: Left;  Marland Kitchen VIDEO  BRONCHOSCOPY N/A 12/15/2013   Procedure: VIDEO BRONCHOSCOPY;  Surgeon: Grace Isaac, MD;  Location: [facility] OR;  Service: Thoracic;  Laterality: N/A;    There were no vitals filed for this visit.      Subjective Assessment - 09/02/16 1449    Subjective Pt. reporting she plans to start back with chair yoga and water aerobics over the next few days and feels that she has a plan for starting back with exercise.     Patient Stated Goals "To be able to walk w/o my cane"   Currently in Pain? No/denies   Pain Score 0-No pain   Multiple Pain Sites No            [facility] PT  Assessment - 09/02/16 1453      Strength   Strength Assessment Site Hip;Knee   Right Hip Flexion 4-/5   Right Hip Extension 3+/5   Right Hip ABduction 4-/5   Right Hip ADduction 4-/5   Left Hip Flexion 4-/5   Left Hip Extension 3+/5   Left Hip ABduction 3+/5   Left Hip ADduction 4-/5   Right/Left Knee Right;Left   Right Knee Flexion 4/5   Right Knee Extension 4+/5   Left Knee Flexion 4/5   Left Knee Extension 4+/5       Today's treatment:  Therex: Recumbent bike: lvl 4, 6 min   MMT   Therex: Hooklying bridge 3" x 15 reps  Hooklying bridge with adduction ball squeeze 3" x 15 reps Hooklying bridge with L hip abduction/ER with blue TB 3" x 15 reps  Standing at the treadmill:        Alternating hip flexion, abduction, extension with red looped TB x 10 reps; pt. Requested activity to be terminated without 2nd set due to fatigue Seated on edge of mat table:        High knee march with blue TB x 10 reps        B hip abd/ER with blue TB 3" x 15 Standing alternating hip abduction, extension, high knee flexion x 10 reps; 2 UE support on treadmill        PT Education - 09/02/16 1539    Education provided Yes   Education Details instructed to cut back to standing SLR without any band resistance until hip strength improves    Person(s) Educated Patient   Methods Explanation;Demonstration   Comprehension Verbalized understanding;Returned demonstration          PT Short Term Goals - 06/06/16 1526      PT SHORT TERM GOAL #1   Title Independent with initial HEP by 06/07/16   Status Achieved           PT Long Term Goals - 07/30/16 1524      PT LONG TERM GOAL #1   Title Independent with advanced HEP as indicated by 09/24/16   Status On-going     PT LONG TERM GOAL #2   Title B hip strength >/= 4/5 for improved gait stability by 09/24/16   Status Partially Met  Met only for R hip ABD, remaining hip strength 4-/5     PT LONG TERM GOAL #3   Title Pt will complete  TUG in </= 13.5 sec to decrease risk for falls by 07/14/16   Status Achieved     PT LONG TERM GOAL #4   Title Pt will demonstrate gait speed >/= 2.6 ft/sec to allow for improved safety with community ambulation by 09/24/16   Status Partially  Met  2.52 ft/sec w/o AD, 2.60 ft/sec with SPC     PT LONG TERM GOAL #5   Title Pt will ambulate community distances w/o SPC w/o evidence of trendelenburg gait pattern by 09/24/16   Status Partially Met  Pt reports increasing need for SPC due to weakness since being on prednisone. Trendelenburg pattern persist, but more evident w/o AD.               Plan - 09/02/16 1504    Clinical Impression Statement Pt. returning to therapy after nearly 5 week absence from therapy due to respiratory illness.  Pt. strength testing revealed a decrease in hip strength of grossly 1/2 grade overall with greatest decrease in L hip motions.  Today's treatment focused on hip strengthening with standing activities performed to pt. tolerance.  Pt. easily fatigued today however reporting she is not 100% yet from illness and had a tough time cooking dinner for family at house yesterday.  Pt. instructed to perform standing SLR activities at home (already issued to pt. previously) without theraband resistance until hip strength improves.  Pt. reporting she intends to return to chair yoga and water aerobics over the next few days.     PT Treatment/Interventions Patient/family education;Therapeutic exercise;Neuromuscular re-education;Gait training;Manual techniques;Taping;Electrical Stimulation;Moist Heat;Cryotherapy;Vasopneumatic Device;Therapeutic activities;ADLs/Self Care Home Management   PT Next Visit Plan Continue PT for Core/hip/knee strenthening - emphasis on CKC and SLS activities; Balance training & dynamic gait activities as strength allows      Patient will benefit from skilled therapeutic intervention in order to improve the following deficits and impairments:  Pain,  Decreased strength, Difficulty walking, Abnormal gait, Decreased balance, Decreased activity tolerance, Decreased endurance, Decreased range of motion  Visit Diagnosis: Difficulty in walking, not elsewhere classified  Other abnormalities of gait and mobility     Problem List Patient Active Problem List   Diagnosis Date Noted  . Bilateral knee pain 05/02/2016  . Trochanteric bursitis of left hip 01/11/2016  . Low back pain 10/02/2015  . Tremor of both hands 09/21/2014  . Gout due to renal impairment 08/30/2014  . Physical deconditioning 06/23/2014  . UTI (lower urinary tract infection) 06/13/2014  . CKD (chronic kidney disease) stage 3, GFR 30-59 ml/min 05/27/2014  . IgA nephropathy 05/27/2014  . Chronic diastolic heart failure ([place]) 05/03/2014  . Steroid-induced diabetes ([place]) 04/18/2014  . Hematuria 02/25/2014  . Diastolic dysfunction- grade 2 02/16/2014  . Leg edema 02/11/2014  . CAD (coronary artery disease), non obstructive by cardiac cath 12/12/13 01/24/2014  . CKD (chronic kidney disease) stage 4, GFR 15-29 ml/min ([facility]) 01/03/2014  . Lung cancer, left lower lobe 12/17/2013  . Status post lobectomy of lung 12/15/2013  . Chest pain with moderate risk of acute coronary syndrome 12/12/2013  . Family history of coronary artery bypass surgery 12/12/2013  . Renal artery atherosclerosis, s/p right RA stent 11/08/13 11/08/2013  . Lung nodule/ adenoca > LLobectomy 12/15/13  11/08/2013  . Premature atrial contractions 10/31/2013  . Hearing loss secondary to cerumen impaction 09/20/2013  . Allergic rhinitis 02/11/2013  . Shoulder pain 03/11/2012  . Upper arm pain 01/26/2012  . General medical examination 07/25/2011  . Obesity- BMI 41 05/10/2011  . Dizziness 04/10/2011  . Hyperlipidemia 02/28/2011  . EDEMA- LOCALIZED 06/04/2010  . OSTEOPENIA 01/15/2010  . CARCINOMA, BASAL CELL 09/22/2009  . ESOPHAGITIS 05/19/2009  . GASTRIC ULCER 05/19/2009  . HIATAL HERNIA 05/19/2009  .  DEGENERATIVE JOINT DISEASE 05/19/2009  . Hypothyroidism 03/23/2009  . Essential hypertension 03/23/2009  .  ASTHMA 03/23/2009  . GERD 03/23/2009  . COLONIC POLYPS, HX OF 03/23/2009    Bess Harvest, PTA 09/02/16 3:51 PM   [facility] [address]  [place], [place], [address] Phone: [phone]   Fax:  [phone]  Name: AUBREE DOODY MRN: [id number] Date of Birth: 19-Oct-1941

## 2016-09-04 ENCOUNTER — Ambulatory Visit: Payer: Medicare Other | Attending: Family Medicine

## 2016-09-04 DIAGNOSIS — G8929 Other chronic pain: Secondary | ICD-10-CM | POA: Diagnosis not present

## 2016-09-04 DIAGNOSIS — R262 Difficulty in walking, not elsewhere classified: Secondary | ICD-10-CM | POA: Diagnosis not present

## 2016-09-04 DIAGNOSIS — M25561 Pain in right knee: Secondary | ICD-10-CM | POA: Insufficient documentation

## 2016-09-04 DIAGNOSIS — M25562 Pain in left knee: Secondary | ICD-10-CM | POA: Diagnosis not present

## 2016-09-04 DIAGNOSIS — R2689 Other abnormalities of gait and mobility: Secondary | ICD-10-CM | POA: Diagnosis not present

## 2016-09-04 DIAGNOSIS — M6281 Muscle weakness (generalized): Secondary | ICD-10-CM

## 2016-09-04 NOTE — Therapy (Signed)
[facility] [address]  [place], [place], [address] Phone: [phone]   Fax:  [phone]  Physical Therapy Treatment  Patient Details  Name: Susan Pittman MRN: [id number] Date of Birth: 1941-10-17 Referring Provider: Dene Gentry, MD  Encounter Date: 09/04/2016      PT End of Session - 09/04/16 1451    Visit Number 18   Number of Visits 26   Date for PT Re-Evaluation 09/24/16   Authorization Type Medicare primary   PT Start Time 1445   PT Stop Time 1528   PT Time Calculation (min) 43 min   Activity Tolerance Patient tolerated treatment well   Behavior During Therapy [facility] for tasks assessed/performed      Past Medical History:  Diagnosis Date  . Allergy   . Arthritis    "knees, hips, back, hands" (11/08/2013)  . Asthma   . Basal cell carcinoma    "cut them off face & right shoulder" (11/08/2013)  . CAD (coronary artery disease), non obstructive by cardiac cath 12/12/13 01/24/2014  . CKD (chronic kidney disease), stage III   . Diastolic CHF ([place])   . DJD (degenerative joint disease)   . Gastric ulcer   . GERD (gastroesophageal reflux disease)   . Hiatal hernia   . Hyperlipidemia    "don't take RX for it" (11/08/2013)  . Hypertension   . Hypothyroid   . Lung mass    superior segment LLL/notes 11/08/2013  . Lung nodule/ adenoca > LLobectomy 12/15/13  11/08/2013   PET 11/23/2013 1. Dominant sub solid left lower lobe nodule does not demonstrate significantly increased metabolic activity. However, morphologically, adenocarcinoma is still a concern.   - Spirometry 11/24/13 wnl  - 11/24/2013 T surg eval rec> LLower lobectomy 12/15/2013 for adenoca   . Nephropathy   . Osteoporosis   . Pneumonia    "I've had it ?3 times" (11/08/2013)  . Renal artery stenosis ([place])    with stent placement    Past Surgical History:  Procedure Laterality Date  . ANTERIOR CERVICAL DECOMP/DISCECTOMY FUSION  2000   C5-C6  . CARDIAC  CATHETERIZATION     Hx: of 2/ 2015  . CHOLECYSTECTOMY  1970's  . CORONARY ANGIOGRAM     Hx: of 2/ 2015  . DILATION AND CURETTAGE OF UTERUS  1960's  . KNEE ARTHROSCOPY Bilateral   . LEFT HEART CATHETERIZATION WITH CORONARY ANGIOGRAM N/A 12/13/2013   Procedure: LEFT HEART CATHETERIZATION WITH CORONARY ANGIOGRAM;  Surgeon: Burnell Blanks, MD;  Location: [facility] CATH LAB;  Service: Cardiovascular;  Laterality: N/A;  . LOBECTOMY Left 12/15/2013   Procedure: LOBECTOMY;  Surgeon: Grace Isaac, MD;  Location: [place];  Service: Thoracic;  Laterality: Left;  . PERCUTANEOUS STENT INTERVENTION Right 11/08/2013   Procedure: PERCUTANEOUS STENT INTERVENTION;  Surgeon: Lorretta Harp, MD;  Location: [facility] CATH LAB;  Service: Cardiovascular;  Laterality: Right;  rt renal artery stent  . RENAL ANGIOGRAM Bilateral 11/08/2013   Procedure: RENAL ANGIOGRAM;  Surgeon: Lorretta Harp, MD;  Location: [facility] CATH LAB;  Service: Cardiovascular;  Laterality: Bilateral;  . RENAL ARTERY STENT Right 11/08/2013   Archie Endo 11/08/2013  . SKIN CANCER EXCISION    . TONSILLECTOMY AND ADENOIDECTOMY  ?1946  . VAGINAL HYSTERECTOMY  1970  . VIDEO ASSISTED THORACOSCOPY (VATS)/WEDGE RESECTION Left 12/15/2013   Procedure: VIDEO ASSISTED THORACOSCOPY (VATS)/WEDGE RESECTION;  Surgeon: Grace Isaac, MD;  Location: [place];  Service: Thoracic;  Laterality: Left;  Marland Kitchen VIDEO  BRONCHOSCOPY N/A 12/15/2013   Procedure: VIDEO BRONCHOSCOPY;  Surgeon: Grace Isaac, MD;  Location: [facility] OR;  Service: Thoracic;  Laterality: N/A;    There were no vitals filed for this visit.      Subjective Assessment - 09/04/16 1449    Subjective Pt. reporting she is sore initially today following chair yoga and hasn't returned to water aerobics yet due to fatigue.     Patient Stated Goals "To be able to walk w/o my cane"   Currently in Pain? No/denies   Pain Score 0-No pain   Multiple Pain Sites No        Today's treatment:  Therex: NuStep: level 6, 5  min   Gait training: Long step to rubber circles on gym track with SPC and close CGA x 4 laps down back to increase step length Dynamic gait without SPC holding onto counter for support:          Side step x 2 laps down back             Tandem walk forward/back x 4 laps         Toe walk x 2 laps down/back          Toe side step x 2 laps down/back  Neuro Re-ed: Cone toe-touch to cones x 16 cones; down/back in line; with SPC  Cone nock-over/put back with toe x 16 cones; down/ back in line; with SPC   Therex:  Standing TKE with one foot on airex pad x 10 reps each side; verbal cues provided to keep hips level; 2 pole support         PT Short Term Goals - 06/06/16 1526      PT SHORT TERM GOAL #1   Title Independent with initial HEP by 06/07/16   Status Achieved           PT Long Term Goals - 07/30/16 1524      PT LONG TERM GOAL #1   Title Independent with advanced HEP as indicated by 09/24/16   Status On-going     PT LONG TERM GOAL #2   Title B hip strength >/= 4/5 for improved gait stability by 09/24/16   Status Partially Met  Met only for R hip ABD, remaining hip strength 4-/5     PT LONG TERM GOAL #3   Title Pt will complete TUG in </= 13.5 sec to decrease risk for falls by 07/14/16   Status Achieved     PT LONG TERM GOAL #4   Title Pt will demonstrate gait speed >/= 2.6 ft/sec to allow for improved safety with community ambulation by 09/24/16   Status Partially Met  2.52 ft/sec w/o AD, 2.60 ft/sec with SPC     PT LONG TERM GOAL #5   Title Pt will ambulate community distances w/o SPC w/o evidence of trendelenburg gait pattern by 09/24/16   Status Partially Met  Pt reports increasing need for SPC due to weakness since being on prednisone. Trendelenburg pattern persist, but more evident w/o AD.               Plan - 09/04/16 1451    Clinical Impression Statement Pt. reporting mild soreness following yesterday's chair yoga in [place] however feels that she  is getting stronger and recovering from respiratory illness.  Pt. able to perform standing balance and dynamic gait activities for entire treatment session today only requiring occasional sitting rest breaks.  Pt. reporting she plans to attend  water aerobics starting Monday of next week.   PT Treatment/Interventions Patient/family education;Therapeutic exercise;Neuromuscular re-education;Gait training;Manual techniques;Taping;Electrical Stimulation;Moist Heat;Cryotherapy;Vasopneumatic Device;Therapeutic activities;ADLs/Self Care Home Management   PT Next Visit Plan Continue PT for Core/hip/knee strenthening - emphasis on CKC and SLS activities; Balance training & dynamic gait activities as strength allows      Patient will benefit from skilled therapeutic intervention in order to improve the following deficits and impairments:  Pain, Decreased strength, Difficulty walking, Abnormal gait, Decreased balance, Decreased activity tolerance, Decreased endurance, Decreased range of motion  Visit Diagnosis: Difficulty in walking, not elsewhere classified  Other abnormalities of gait and mobility  Muscle weakness (generalized)  Chronic pain of left knee  Chronic pain of right knee     Problem List Patient Active Problem List   Diagnosis Date Noted  . Bilateral knee pain 05/02/2016  . Trochanteric bursitis of left hip 01/11/2016  . Low back pain 10/02/2015  . Tremor of both hands 09/21/2014  . Gout due to renal impairment 08/30/2014  . Physical deconditioning 06/23/2014  . UTI (lower urinary tract infection) 06/13/2014  . CKD (chronic kidney disease) stage 3, GFR 30-59 ml/min 05/27/2014  . IgA nephropathy 05/27/2014  . Chronic diastolic heart failure ([place]) 05/03/2014  . Steroid-induced diabetes ([place]) 04/18/2014  . Hematuria 02/25/2014  . Diastolic dysfunction- grade 2 02/16/2014  . Leg edema 02/11/2014  . CAD (coronary artery disease), non obstructive by cardiac cath 12/12/13 01/24/2014   . CKD (chronic kidney disease) stage 4, GFR 15-29 ml/min ([facility]) 01/03/2014  . Lung cancer, left lower lobe 12/17/2013  . Status post lobectomy of lung 12/15/2013  . Chest pain with moderate risk of acute coronary syndrome 12/12/2013  . Family history of coronary artery bypass surgery 12/12/2013  . Renal artery atherosclerosis, s/p right RA stent 11/08/13 11/08/2013  . Lung nodule/ adenoca > LLobectomy 12/15/13  11/08/2013  . Premature atrial contractions 10/31/2013  . Hearing loss secondary to cerumen impaction 09/20/2013  . Allergic rhinitis 02/11/2013  . Shoulder pain 03/11/2012  . Upper arm pain 01/26/2012  . General medical examination 07/25/2011  . Obesity- BMI 41 05/10/2011  . Dizziness 04/10/2011  . Hyperlipidemia 02/28/2011  . EDEMA- LOCALIZED 06/04/2010  . OSTEOPENIA 01/15/2010  . CARCINOMA, BASAL CELL 09/22/2009  . ESOPHAGITIS 05/19/2009  . GASTRIC ULCER 05/19/2009  . HIATAL HERNIA 05/19/2009  . DEGENERATIVE JOINT DISEASE 05/19/2009  . Hypothyroidism 03/23/2009  . Essential hypertension 03/23/2009  . ASTHMA 03/23/2009  . GERD 03/23/2009  . COLONIC POLYPS, HX OF 03/23/2009   Bess Harvest, PTA 09/04/16 3:54 PM  [facility] [address]  [address] [place], [place], [address] Phone: [phone]   Fax:  [phone]  Name: Susan Pittman MRN: [id number] Date of Birth: 01-07-1941

## 2016-09-09 ENCOUNTER — Ambulatory Visit: Payer: Medicare Other | Admitting: Physical Therapy

## 2016-09-09 DIAGNOSIS — R2689 Other abnormalities of gait and mobility: Secondary | ICD-10-CM | POA: Diagnosis not present

## 2016-09-09 DIAGNOSIS — M25561 Pain in right knee: Secondary | ICD-10-CM

## 2016-09-09 DIAGNOSIS — R262 Difficulty in walking, not elsewhere classified: Secondary | ICD-10-CM | POA: Diagnosis not present

## 2016-09-09 DIAGNOSIS — G8929 Other chronic pain: Secondary | ICD-10-CM

## 2016-09-09 DIAGNOSIS — M6281 Muscle weakness (generalized): Secondary | ICD-10-CM | POA: Diagnosis not present

## 2016-09-09 DIAGNOSIS — M25562 Pain in left knee: Secondary | ICD-10-CM | POA: Diagnosis not present

## 2016-09-09 NOTE — Therapy (Signed)
[facility] [address]  [place], [place], [address] Phone: [phone]   Fax:  [phone]  Physical Therapy Treatment  Patient Details  Name: Susan Pittman MRN: [id number] Date of Birth: 21-Mar-1941 Referring Provider: Dene Gentry, MD  Encounter Date: 09/09/2016      PT End of Session - 09/09/16 1400    Visit Number 19   Number of Visits 26   Date for PT Re-Evaluation 09/24/16   Authorization Type Medicare primary   PT Start Time 1400   PT Stop Time 1444   PT Time Calculation (min) 44 min   Activity Tolerance Patient tolerated treatment well;Patient limited by fatigue   Behavior During Therapy [facility] for tasks assessed/performed      Past Medical History:  Diagnosis Date  . Allergy   . Arthritis    "knees, hips, back, hands" (11/08/2013)  . Asthma   . Basal cell carcinoma    "cut them off face & right shoulder" (11/08/2013)  . CAD (coronary artery disease), non obstructive by cardiac cath 12/12/13 01/24/2014  . CKD (chronic kidney disease), stage III   . Diastolic CHF ([place])   . DJD (degenerative joint disease)   . Gastric ulcer   . GERD (gastroesophageal reflux disease)   . Hiatal hernia   . Hyperlipidemia    "don't take RX for it" (11/08/2013)  . Hypertension   . Hypothyroid   . Lung mass    superior segment LLL/notes 11/08/2013  . Lung nodule/ adenoca > LLobectomy 12/15/13  11/08/2013   PET 11/23/2013 1. Dominant sub solid left lower lobe nodule does not demonstrate significantly increased metabolic activity. However, morphologically, adenocarcinoma is still a concern.   - Spirometry 11/24/13 wnl  - 11/24/2013 T surg eval rec> LLower lobectomy 12/15/2013 for adenoca   . Nephropathy   . Osteoporosis   . Pneumonia    "I've had it ?3 times" (11/08/2013)  . Renal artery stenosis ([place])    with stent placement    Past Surgical History:  Procedure Laterality Date  . ANTERIOR CERVICAL DECOMP/DISCECTOMY FUSION  2000    C5-C6  . CARDIAC CATHETERIZATION     Hx: of 2/ 2015  . CHOLECYSTECTOMY  1970's  . CORONARY ANGIOGRAM     Hx: of 2/ 2015  . DILATION AND CURETTAGE OF UTERUS  1960's  . KNEE ARTHROSCOPY Bilateral   . LEFT HEART CATHETERIZATION WITH CORONARY ANGIOGRAM N/A 12/13/2013   Procedure: LEFT HEART CATHETERIZATION WITH CORONARY ANGIOGRAM;  Surgeon: Burnell Blanks, MD;  Location: [facility] CATH LAB;  Service: Cardiovascular;  Laterality: N/A;  . LOBECTOMY Left 12/15/2013   Procedure: LOBECTOMY;  Surgeon: Grace Isaac, MD;  Location: [place];  Service: Thoracic;  Laterality: Left;  . PERCUTANEOUS STENT INTERVENTION Right 11/08/2013   Procedure: PERCUTANEOUS STENT INTERVENTION;  Surgeon: Lorretta Harp, MD;  Location: [facility] CATH LAB;  Service: Cardiovascular;  Laterality: Right;  rt renal artery stent  . RENAL ANGIOGRAM Bilateral 11/08/2013   Procedure: RENAL ANGIOGRAM;  Surgeon: Lorretta Harp, MD;  Location: [facility] CATH LAB;  Service: Cardiovascular;  Laterality: Bilateral;  . RENAL ARTERY STENT Right 11/08/2013   Archie Endo 11/08/2013  . SKIN CANCER EXCISION    . TONSILLECTOMY AND ADENOIDECTOMY  ?1946  . VAGINAL HYSTERECTOMY  1970  . VIDEO ASSISTED THORACOSCOPY (VATS)/WEDGE RESECTION Left 12/15/2013   Procedure: VIDEO ASSISTED THORACOSCOPY (VATS)/WEDGE RESECTION;  Surgeon: Grace Isaac, MD;  Location: [place];  Service: Thoracic;  Laterality: Left;  .  VIDEO BRONCHOSCOPY N/A 12/15/2013   Procedure: VIDEO BRONCHOSCOPY;  Surgeon: Grace Isaac, MD;  Location: [facility] OR;  Service: Thoracic;  Laterality: N/A;    There were no vitals filed for this visit.      Subjective Assessment - 09/09/16 1403    Subjective Reports she did water aerobics today and "that has really worn me out". C/o increased pain in legs after last treatment session 5 days ago with knee pain lasting until yesterday.   Patient Stated Goals "To be able to walk w/o my cane"   Currently in Pain? No/denies           Today's  Treatment  TherEx NuStep - level 6 x 5'  Standing at treadmill rail:    B 3 way Hip SLR with looped yellow TB x10    B SL Lat Step-up to aerobic step (no risers)  + Opp hip abduction x10    B SL Fwd Step-up to aerobic step + Opp hip extension x10    B SLS on edge of aerobic step + Opp hip hike x10    B Sidestepping along rail in slight squat with yellow TB with intermittent UE support 5 x 86f    B Heel raises x20    Squat x15           PT Short Term Goals - 06/06/16 1526      PT SHORT TERM GOAL #1   Title Independent with initial HEP by 06/07/16   Status Achieved           PT Long Term Goals - 07/30/16 1524      PT LONG TERM GOAL #1   Title Independent with advanced HEP as indicated by 09/24/16   Status On-going     PT LONG TERM GOAL #2   Title B hip strength >/= 4/5 for improved gait stability by 09/24/16   Status Partially Met  Met only for R hip ABD, remaining hip strength 4-/5     PT LONG TERM GOAL #3   Title Pt will complete TUG in </= 13.5 sec to decrease risk for falls by 07/14/16   Status Achieved     PT LONG TERM GOAL #4   Title Pt will demonstrate gait speed >/= 2.6 ft/sec to allow for improved safety with community ambulation by 09/24/16   Status Partially Met  2.52 ft/sec w/o AD, 2.60 ft/sec with SPC     PT LONG TERM GOAL #5   Title Pt will ambulate community distances w/o SPC w/o evidence of trendelenburg gait pattern by 09/24/16   Status Partially Met  Pt reports increasing need for SPC due to weakness since being on prednisone. Trendelenburg pattern persist, but more evident w/o AD.               Plan - 09/09/16 1407    Clinical Impression Statement Pt continues to report fatigue intermittently limiting daily activities with fatigue more pronounced after water aerobics and during therapy session exercises with increased work of breathing noted during therapy sessions. Reintroduced therband resistance with yellow TB for standing hip  exercises with some fatigue noted but no increased pain. pt was to have tapered down to 1x/wk at this point, but given extended absence due to illness will continue 2x/wk frequency until end of current cert period.   PT Treatment/Interventions Patient/family education;Therapeutic exercise;Neuromuscular re-education;Gait training;Manual techniques;Taping;Electrical Stimulation;Moist Heat;Cryotherapy;Vasopneumatic Device;Therapeutic activities;ADLs/Self Care Home Management   PT Next Visit Plan Continue PT for Core/hip/knee strenthening - emphasis  on CKC and SLS activities; Balance training & dynamic gait activities as strength allows      Patient will benefit from skilled therapeutic intervention in order to improve the following deficits and impairments:  Pain, Decreased strength, Difficulty walking, Abnormal gait, Decreased balance, Decreased activity tolerance, Decreased endurance, Decreased range of motion  Visit Diagnosis: Difficulty in walking, not elsewhere classified  Other abnormalities of gait and mobility  Muscle weakness (generalized)  Chronic pain of left knee  Chronic pain of right knee     Problem List Patient Active Problem List   Diagnosis Date Noted  . Bilateral knee pain 05/02/2016  . Trochanteric bursitis of left hip 01/11/2016  . Low back pain 10/02/2015  . Tremor of both hands 09/21/2014  . Gout due to renal impairment 08/30/2014  . Physical deconditioning 06/23/2014  . UTI (lower urinary tract infection) 06/13/2014  . CKD (chronic kidney disease) stage 3, GFR 30-59 ml/min 05/27/2014  . IgA nephropathy 05/27/2014  . Chronic diastolic heart failure ([place]) 05/03/2014  . Steroid-induced diabetes ([place]) 04/18/2014  . Hematuria 02/25/2014  . Diastolic dysfunction- grade 2 02/16/2014  . Leg edema 02/11/2014  . CAD (coronary artery disease), non obstructive by cardiac cath 12/12/13 01/24/2014  . CKD (chronic kidney disease) stage 4, GFR 15-29 ml/min ([facility]) 01/03/2014   . Lung cancer, left lower lobe 12/17/2013  . Status post lobectomy of lung 12/15/2013  . Chest pain with moderate risk of acute coronary syndrome 12/12/2013  . Family history of coronary artery bypass surgery 12/12/2013  . Renal artery atherosclerosis, s/p right RA stent 11/08/13 11/08/2013  . Lung nodule/ adenoca > LLobectomy 12/15/13  11/08/2013  . Premature atrial contractions 10/31/2013  . Hearing loss secondary to cerumen impaction 09/20/2013  . Allergic rhinitis 02/11/2013  . Shoulder pain 03/11/2012  . Upper arm pain 01/26/2012  . General medical examination 07/25/2011  . Obesity- BMI 41 05/10/2011  . Dizziness 04/10/2011  . Hyperlipidemia 02/28/2011  . EDEMA- LOCALIZED 06/04/2010  . OSTEOPENIA 01/15/2010  . CARCINOMA, BASAL CELL 09/22/2009  . ESOPHAGITIS 05/19/2009  . GASTRIC ULCER 05/19/2009  . HIATAL HERNIA 05/19/2009  . DEGENERATIVE JOINT DISEASE 05/19/2009  . Hypothyroidism 03/23/2009  . Essential hypertension 03/23/2009  . ASTHMA 03/23/2009  . GERD 03/23/2009  . COLONIC POLYPS, HX OF 03/23/2009    Percival Spanish, PT, MPT 09/09/2016, 2:47 PM  [facility] [address]  [place], [place], [address] Phone: [phone]   Fax:  [phone]  Name: Susan Pittman MRN: [id number] Date of Birth: 01/14/1941

## 2016-09-11 ENCOUNTER — Ambulatory Visit: Payer: Medicare Other

## 2016-09-11 DIAGNOSIS — R262 Difficulty in walking, not elsewhere classified: Secondary | ICD-10-CM | POA: Diagnosis not present

## 2016-09-11 DIAGNOSIS — R2689 Other abnormalities of gait and mobility: Secondary | ICD-10-CM | POA: Diagnosis not present

## 2016-09-11 DIAGNOSIS — G8929 Other chronic pain: Secondary | ICD-10-CM

## 2016-09-11 DIAGNOSIS — M6281 Muscle weakness (generalized): Secondary | ICD-10-CM | POA: Diagnosis not present

## 2016-09-11 DIAGNOSIS — M25562 Pain in left knee: Secondary | ICD-10-CM | POA: Diagnosis not present

## 2016-09-11 DIAGNOSIS — M25561 Pain in right knee: Secondary | ICD-10-CM

## 2016-09-11 NOTE — Therapy (Signed)
[facility] [address]  [place], [place], [address] Phone: [phone]   Fax:  [phone]  Physical Therapy Treatment  Patient Details  Name: Susan Pittman MRN: [id number] Date of Birth: November 06, 1940 Referring Provider: Dene Gentry, MD  Encounter Date: 09/11/2016      PT End of Session - 09/11/16 1507    Visit Number 20   Number of Visits 26   Date for PT Re-Evaluation 09/24/16   Authorization Type Medicare primary   PT Start Time 1445   PT Stop Time 1525   PT Time Calculation (min) 40 min   Activity Tolerance Patient tolerated treatment well;Patient limited by fatigue   Behavior During Therapy [facility] for tasks assessed/performed      Past Medical History:  Diagnosis Date  . Allergy   . Arthritis    "knees, hips, back, hands" (11/08/2013)  . Asthma   . Basal cell carcinoma    "cut them off face & right shoulder" (11/08/2013)  . CAD (coronary artery disease), non obstructive by cardiac cath 12/12/13 01/24/2014  . CKD (chronic kidney disease), stage III   . Diastolic CHF ([place])   . DJD (degenerative joint disease)   . Gastric ulcer   . GERD (gastroesophageal reflux disease)   . Hiatal hernia   . Hyperlipidemia    "don't take RX for it" (11/08/2013)  . Hypertension   . Hypothyroid   . Lung mass    superior segment LLL/notes 11/08/2013  . Lung nodule/ adenoca > LLobectomy 12/15/13  11/08/2013   PET 11/23/2013 1. Dominant sub solid left lower lobe nodule does not demonstrate significantly increased metabolic activity. However, morphologically, adenocarcinoma is still a concern.   - Spirometry 11/24/13 wnl  - 11/24/2013 T surg eval rec> LLower lobectomy 12/15/2013 for adenoca   . Nephropathy   . Osteoporosis   . Pneumonia    "I've had it ?3 times" (11/08/2013)  . Renal artery stenosis ([place])    with stent placement    Past Surgical History:  Procedure Laterality Date  . ANTERIOR CERVICAL DECOMP/DISCECTOMY FUSION  2000    C5-C6  . CARDIAC CATHETERIZATION     Hx: of 2/ 2015  . CHOLECYSTECTOMY  1970's  . CORONARY ANGIOGRAM     Hx: of 2/ 2015  . DILATION AND CURETTAGE OF UTERUS  1960's  . KNEE ARTHROSCOPY Bilateral   . LEFT HEART CATHETERIZATION WITH CORONARY ANGIOGRAM N/A 12/13/2013   Procedure: LEFT HEART CATHETERIZATION WITH CORONARY ANGIOGRAM;  Surgeon: Burnell Blanks, MD;  Location: [facility] CATH LAB;  Service: Cardiovascular;  Laterality: N/A;  . LOBECTOMY Left 12/15/2013   Procedure: LOBECTOMY;  Surgeon: Grace Isaac, MD;  Location: [place];  Service: Thoracic;  Laterality: Left;  . PERCUTANEOUS STENT INTERVENTION Right 11/08/2013   Procedure: PERCUTANEOUS STENT INTERVENTION;  Surgeon: Lorretta Harp, MD;  Location: [facility] CATH LAB;  Service: Cardiovascular;  Laterality: Right;  rt renal artery stent  . RENAL ANGIOGRAM Bilateral 11/08/2013   Procedure: RENAL ANGIOGRAM;  Surgeon: Lorretta Harp, MD;  Location: [facility] CATH LAB;  Service: Cardiovascular;  Laterality: Bilateral;  . RENAL ARTERY STENT Right 11/08/2013   Archie Endo 11/08/2013  . SKIN CANCER EXCISION    . TONSILLECTOMY AND ADENOIDECTOMY  ?1946  . VAGINAL HYSTERECTOMY  1970  . VIDEO ASSISTED THORACOSCOPY (VATS)/WEDGE RESECTION Left 12/15/2013   Procedure: VIDEO ASSISTED THORACOSCOPY (VATS)/WEDGE RESECTION;  Surgeon: Grace Isaac, MD;  Location: [place];  Service: Thoracic;  Laterality: Left;  .  VIDEO BRONCHOSCOPY N/A 12/15/2013   Procedure: VIDEO BRONCHOSCOPY;  Surgeon: Grace Isaac, MD;  Location: [facility] OR;  Service: Thoracic;  Laterality: N/A;    There were no vitals filed for this visit.      Subjective Assessment - 09/11/16 1500    Subjective Pt. reporting her B knees were sore for three days after performing side step up onto airex pad two treatments ago.    Patient Stated Goals "To be able to walk w/o my cane"   Currently in Pain? No/denies   Pain Score 0-No pain   Multiple Pain Sites No            [facility] PT Assessment - 09/11/16  1515      Observation/Other Assessments   Focus on Therapeutic Outcomes (FOTO)  54% (46% limitation)     Timed Up and Go Test   TUG Normal TUG   Normal TUG (seconds) 11.92  with SPC not tested without AD            Today's Treatment  TherEx NuStep - level 7 x 6' Holding onto counter:         Side stepping with yellow TB x 4 laps          High knee march with yellow TB x 4 laps Standing with 2 pole support alternating hip abduction with red TB x 10 reps  Standing at treadmill rail:         B SL Lat Step-up to aerobic step (no risers)  + Opp hip abduction x 10 reps          B SL Fwd Step-up to aerobic step          B SLS on edge of aerobic step + Opp hip hike x 10 reps   TUG test           PT Short Term Goals - 06/06/16 1526      PT SHORT TERM GOAL #1   Title Independent with initial HEP by 06/07/16   Status Achieved           PT Long Term Goals - 07/30/16 1524      PT LONG TERM GOAL #1   Title Independent with advanced HEP as indicated by 09/24/16   Status On-going     PT LONG TERM GOAL #2   Title B hip strength >/= 4/5 for improved gait stability by 09/24/16   Status Partially Met  Met only for R hip ABD, remaining hip strength 4-/5     PT LONG TERM GOAL #3   Title Pt will complete TUG in </= 13.5 sec to decrease risk for falls by 07/14/16   Status Achieved     PT LONG TERM GOAL #4   Title Pt will demonstrate gait speed >/= 2.6 ft/sec to allow for improved safety with community ambulation by 09/24/16   Status Partially Met  2.52 ft/sec w/o AD, 2.60 ft/sec with SPC     PT LONG TERM GOAL #5   Title Pt will ambulate community distances w/o SPC w/o evidence of trendelenburg gait pattern by 09/24/16   Status Partially Met  Pt reports increasing need for SPC due to weakness since being on prednisone. Trendelenburg pattern persist, but more evident w/o AD.               Plan - 09/11/16 1509    Clinical Impression Statement Pt. tolerated  continued standing balance and hip strengthening activity well today reporting B knees  pain free.  Pt. reporting full recovery following B knee flare up two visits ago.  Dynamic gait for improved walking speed and increased step length continued today and tolerated well.  Pt. still requiring frequent sitting rest breaks due to shortness of breath following recent respiratory illness.  Pt. able to demo TUG score of 11.92 sec today with SPC however still very unsteady without AD.  Pt. still with Trendelenburg weakness on L despite lateral hip strengthening activities.  Pt. reporting she is now back to both chair yoga and water aerobics and tolerating each with only mild soreness.  Pt. progressing well with 6 more treatments left in current POC.   PT Treatment/Interventions Patient/family education;Therapeutic exercise;Neuromuscular re-education;Gait training;Manual techniques;Taping;Electrical Stimulation;Moist Heat;Cryotherapy;Vasopneumatic Device;Therapeutic activities;ADLs/Self Care Home Management   PT Next Visit Plan Continue PT for Core/hip/knee strenthening - emphasis on CKC and SLS activities; Balance training & dynamic gait activities as strength allows      Patient will benefit from skilled therapeutic intervention in order to improve the following deficits and impairments:  Pain, Decreased strength, Difficulty walking, Abnormal gait, Decreased balance, Decreased activity tolerance, Decreased endurance, Decreased range of motion  Visit Diagnosis: Difficulty in walking, not elsewhere classified  Other abnormalities of gait and mobility  Muscle weakness (generalized)  Chronic pain of left knee  Chronic pain of right knee       G-Codes - 09/22/2016 1200    Functional Assessment Tool Used TUG = 11.92 sec + Clinical judgement   Functional Limitation Mobility: Walking and moving around   Mobility: Walking and Moving Around Current Status [phone]) At least 20 percent but less than 40 percent  impaired, limited or restricted   Mobility: Walking and Moving Around Goal Status [phone]) At least 20 percent but less than 40 percent impaired, limited or restricted      Problem List Patient Active Problem List   Diagnosis Date Noted  . Bilateral knee pain 05/02/2016  . Trochanteric bursitis of left hip 01/11/2016  . Low back pain 10/02/2015  . Tremor of both hands 09/21/2014  . Gout due to renal impairment 08/30/2014  . Physical deconditioning 06/23/2014  . UTI (lower urinary tract infection) 06/13/2014  . CKD (chronic kidney disease) stage 3, GFR 30-59 ml/min 05/27/2014  . IgA nephropathy 05/27/2014  . Chronic diastolic heart failure ([place]) 05/03/2014  . Steroid-induced diabetes ([place]) 04/18/2014  . Hematuria 02/25/2014  . Diastolic dysfunction- grade 2 02/16/2014  . Leg edema 02/11/2014  . CAD (coronary artery disease), non obstructive by cardiac cath 12/12/13 01/24/2014  . CKD (chronic kidney disease) stage 4, GFR 15-29 ml/min (HCC) 01/03/2014  . Lung cancer, left lower lobe 12/17/2013  . Status post lobectomy of lung 12/15/2013  . Chest pain with moderate risk of acute coronary syndrome 12/12/2013  . Family history of coronary artery bypass surgery 12/12/2013  . Renal artery atherosclerosis, s/p right RA stent 11/08/13 11/08/2013  . Lung nodule/ adenoca > LLobectomy 12/15/13  11/08/2013  . Premature atrial contractions 10/31/2013  . Hearing loss secondary to cerumen impaction 09/20/2013  . Allergic rhinitis 02/11/2013  . Shoulder pain 03/11/2012  . Upper arm pain 01/26/2012  . General medical examination 07/25/2011  . Obesity- BMI 41 05/10/2011  . Dizziness 04/10/2011  . Hyperlipidemia 02/28/2011  . EDEMA- LOCALIZED 06/04/2010  . OSTEOPENIA 01/15/2010  . CARCINOMA, BASAL CELL 09/22/2009  . ESOPHAGITIS 05/19/2009  . GASTRIC ULCER 05/19/2009  . HIATAL HERNIA 05/19/2009  . DEGENERATIVE JOINT DISEASE 05/19/2009  . Hypothyroidism 03/23/2009  . Essential hypertension  03/23/2009  . ASTHMA 03/23/2009  . GERD 03/23/2009  . COLONIC POLYPS, HX OF 03/23/2009    Bess Harvest, PTA 09/12/16 12:02 PM  [facility] [address]  [place], [place], [address] Phone: [phone]   Fax:  [phone]  Name: Susan Pittman MRN: [id number] Date of Birth: November 27, 1940   Percival Spanish, PT, MPT 09/12/16, 12:03 PM  [facility] [address]  [place], [place], [address] Phone: [phone]   Fax:  [phone]

## 2016-09-16 ENCOUNTER — Telehealth: Payer: Self-pay | Admitting: Family Medicine

## 2016-09-16 ENCOUNTER — Ambulatory Visit (INDEPENDENT_AMBULATORY_CARE_PROVIDER_SITE_OTHER): Payer: Medicare Other | Admitting: Family Medicine

## 2016-09-16 ENCOUNTER — Ambulatory Visit: Payer: Medicare Other | Admitting: Physical Therapy

## 2016-09-16 ENCOUNTER — Encounter: Payer: Self-pay | Admitting: Family Medicine

## 2016-09-16 VITALS — BP 126/81 | HR 51 | Temp 98.2°F | Resp 16 | Ht 65.0 in | Wt 230.5 lb

## 2016-09-16 DIAGNOSIS — I701 Atherosclerosis of renal artery: Secondary | ICD-10-CM | POA: Diagnosis not present

## 2016-09-16 DIAGNOSIS — E785 Hyperlipidemia, unspecified: Secondary | ICD-10-CM | POA: Diagnosis not present

## 2016-09-16 DIAGNOSIS — G8929 Other chronic pain: Secondary | ICD-10-CM

## 2016-09-16 DIAGNOSIS — I10 Essential (primary) hypertension: Secondary | ICD-10-CM | POA: Diagnosis not present

## 2016-09-16 DIAGNOSIS — R2689 Other abnormalities of gait and mobility: Secondary | ICD-10-CM

## 2016-09-16 DIAGNOSIS — R262 Difficulty in walking, not elsewhere classified: Secondary | ICD-10-CM

## 2016-09-16 DIAGNOSIS — R32 Unspecified urinary incontinence: Secondary | ICD-10-CM | POA: Insufficient documentation

## 2016-09-16 DIAGNOSIS — M25562 Pain in left knee: Secondary | ICD-10-CM

## 2016-09-16 DIAGNOSIS — N3941 Urge incontinence: Secondary | ICD-10-CM | POA: Diagnosis not present

## 2016-09-16 DIAGNOSIS — M25561 Pain in right knee: Secondary | ICD-10-CM | POA: Diagnosis not present

## 2016-09-16 DIAGNOSIS — E038 Other specified hypothyroidism: Secondary | ICD-10-CM | POA: Diagnosis not present

## 2016-09-16 DIAGNOSIS — M6281 Muscle weakness (generalized): Secondary | ICD-10-CM

## 2016-09-16 LAB — HEPATIC FUNCTION PANEL
ALBUMIN: 4 g/dL (ref 3.5–5.2)
ALK PHOS: 108 U/L (ref 39–117)
ALT: 11 U/L (ref 0–35)
AST: 16 U/L (ref 0–37)
BILIRUBIN DIRECT: 0.1 mg/dL (ref 0.0–0.3)
TOTAL PROTEIN: 6.5 g/dL (ref 6.0–8.3)
Total Bilirubin: 0.5 mg/dL (ref 0.2–1.2)

## 2016-09-16 LAB — BASIC METABOLIC PANEL
BUN: 50 mg/dL — ABNORMAL HIGH (ref 6–23)
CALCIUM: 9.5 mg/dL (ref 8.4–10.5)
CHLORIDE: 102 meq/L (ref 96–112)
CO2: 32 meq/L (ref 19–32)
CREATININE: 1.58 mg/dL — AB (ref 0.40–1.20)
GFR: 33.63 mL/min — ABNORMAL LOW (ref >60.00)
GLUCOSE: 92 mg/dL (ref 70–99)
Potassium: 4.8 mEq/L (ref 3.5–5.1)
Sodium: 145 mEq/L (ref 135–145)

## 2016-09-16 LAB — CBC WITH DIFFERENTIAL/PLATELET
BASOS ABS: 0 10*3/uL (ref 0.0–0.1)
BASOS PCT: 0.3 % (ref 0.0–3.0)
EOS ABS: 0.3 10*3/uL (ref 0.0–0.7)
Eosinophils Relative: 3.4 % (ref 0.0–5.0)
HEMATOCRIT: 38.2 % (ref 36.0–46.0)
Hemoglobin: 12.1 g/dL (ref 12.0–15.0)
LYMPHS PCT: 16.6 % (ref 12.0–46.0)
Lymphs Abs: 1.4 10*3/uL (ref 0.7–4.0)
MCHC: 31.8 g/dL (ref 30.0–36.0)
MCV: 83.1 fl (ref 78.0–100.0)
MONO ABS: 0.6 10*3/uL (ref 0.1–1.0)
Monocytes Relative: 6.7 % (ref 3.0–12.0)
NEUTROS ABS: 6 10*3/uL (ref 1.4–7.7)
Neutrophils Relative %: 73 % (ref 43.0–77.0)
PLATELETS: 301 10*3/uL (ref 150.0–400.0)
RBC: 4.6 Mil/uL (ref 3.87–5.11)
RDW: 19.4 % — AB (ref 11.5–15.5)
WBC: 8.2 10*3/uL (ref 4.0–10.5)

## 2016-09-16 LAB — LIPID PANEL
CHOLESTEROL: 213 mg/dL — AB (ref 0–200)
HDL: 48.1 mg/dL (ref >39.00)
LDL Cholesterol: 134 mg/dL — ABNORMAL HIGH (ref 0–99)
NONHDL: 165.33
Total CHOL/HDL Ratio: 4
Triglycerides: 156 mg/dL — ABNORMAL HIGH (ref 0.0–149.0)
VLDL: 31.2 mg/dL (ref 0.0–40.0)

## 2016-09-16 LAB — TSH: TSH: 2.32 u[IU]/mL (ref 0.35–4.50)

## 2016-09-16 MED ORDER — SOLIFENACIN SUCCINATE 5 MG PO TABS: 5.0000 mg | ORAL_TABLET | Freq: Every day | ORAL | 3 refills | Status: DC

## 2016-09-16 NOTE — Telephone Encounter (Signed)
Pt states that she wants her Rx from today sent to [facility] and not [facility].

## 2016-09-16 NOTE — Assessment & Plan Note (Signed)
chronic problem.  Pt is having increased fatigue.  Check labs.  Adjust meds prn.

## 2016-09-16 NOTE — Progress Notes (Signed)
   Subjective:    Patient ID: Susan Pittman, female    DOB: 13-Jan-1941, 75 y.o.   MRN: [id number]  HPI HTN- chronic problem, on Bystolic, Hydralazine, and Lasix w/ good control.  Pt reports getting 'light headed and tired easily'.  Pt will take BP when she is symptomatic and it is normal at this time.  Denies CP, SOB above baseline.  No edema  Hypothyroid- on Levothyroxine 21mg daily.  + fatigue.  Denies changes to skin/hair/nails.  Hyperlipidemia- chronic problem, not currently on a statin.  Exercising regularly- water aerobics and chair yoga.    Urinary incontinence- wearing pads daily, getting very frustrated with worsening sxs.     Review of Systems For ROS see HPI     Objective:   Physical Exam  Constitutional: She is oriented to person, place, and time. She appears well-developed and well-nourished. No distress.  HENT:  Head: Normocephalic and atraumatic.  Eyes: Conjunctivae and EOM are normal. Pupils are equal, round, and reactive to light.  Neck: Normal range of motion. Neck supple. No thyromegaly present.  Cardiovascular: Normal rate, regular rhythm, normal heart sounds and intact distal pulses.   No murmur heard. Pulmonary/Chest: Effort normal and breath sounds normal. No respiratory distress.  Abdominal: Soft. She exhibits no distension. There is no tenderness.  Musculoskeletal: She exhibits no edema.  Lymphadenopathy:    She has no cervical adenopathy.  Neurological: She is alert and oriented to person, place, and time.  Skin: Skin is warm and dry.  Psychiatric: She has a normal mood and affect. Her behavior is normal.  Vitals reviewed.         Assessment & Plan:

## 2016-09-16 NOTE — Assessment & Plan Note (Signed)
New to provider, ongoing for pt.  Start Vesicare and monitor for improvement.  Appropriate use and side effects- including dry mouth, dry eye, hypotension, and dizziness discussed w/ pt

## 2016-09-16 NOTE — Patient Instructions (Signed)
Follow up in 4-6 weeks to recheck urinary symptoms We'll notify you of your lab results and make any changes if needed Continue to work on healthy diet and regular exercise- this will build your stamina Make sure you are staying hydrated! Start the Vesicare once daily to help with the overactive bladder Call with any questions or concerns Happy Holidays!!!

## 2016-09-16 NOTE — Assessment & Plan Note (Signed)
Chronic problem.  Not currently on medication and attempting to control w/ healthy diet and regular exercise.  Check labs.  Start meds prn.

## 2016-09-16 NOTE — Telephone Encounter (Signed)
Medication today was filled to [facility].

## 2016-09-16 NOTE — Assessment & Plan Note (Signed)
Chronic problem.  Well controlled today.  Asymptomatic w/ exception of fatigue.  Check labs.  No anticipated med changes

## 2016-09-16 NOTE — Progress Notes (Signed)
Pre visit review using our clinic review tool, if applicable. No additional management support is needed unless otherwise documented below in the visit note. 

## 2016-09-16 NOTE — Therapy (Signed)
[facility] [address]  [place], [place], [address] Phone: [phone]   Fax:  [phone]  Physical Therapy Treatment  Patient Details  Name: Susan Pittman MRN: [id number] Date of Birth: Jun 18, 1941 Referring Provider: Dene Gentry, MD  Encounter Date: 09/16/2016      PT End of Session - 09/16/16 1445    Visit Number 21   Number of Visits 26   Date for PT Re-Evaluation 09/24/16   Authorization Type Medicare primary   PT Start Time 1445   PT Stop Time 1532   PT Time Calculation (min) 47 min   Activity Tolerance Patient tolerated treatment well;Patient limited by fatigue   Behavior During Therapy [facility] for tasks assessed/performed      Past Medical History:  Diagnosis Date  . Allergy   . Arthritis    "knees, hips, back, hands" (11/08/2013)  . Asthma   . Basal cell carcinoma    "cut them off face & right shoulder" (11/08/2013)  . CAD (coronary artery disease), non obstructive by cardiac cath 12/12/13 01/24/2014  . CKD (chronic kidney disease), stage III   . Diastolic CHF ([place])   . DJD (degenerative joint disease)   . Gastric ulcer   . GERD (gastroesophageal reflux disease)   . Hiatal hernia   . Hyperlipidemia    "don't take RX for it" (11/08/2013)  . Hypertension   . Hypothyroid   . Lung mass    superior segment LLL/notes 11/08/2013  . Lung nodule/ adenoca > LLobectomy 12/15/13  11/08/2013   PET 11/23/2013 1. Dominant sub solid left lower lobe nodule does not demonstrate significantly increased metabolic activity. However, morphologically, adenocarcinoma is still a concern.   - Spirometry 11/24/13 wnl  - 11/24/2013 T surg eval rec> LLower lobectomy 12/15/2013 for adenoca   . Nephropathy   . Osteoporosis   . Pneumonia    "I've had it ?3 times" (11/08/2013)  . Renal artery stenosis ([place])    with stent placement    Past Surgical History:  Procedure Laterality Date  . ANTERIOR CERVICAL DECOMP/DISCECTOMY FUSION   2000   C5-C6  . CARDIAC CATHETERIZATION     Hx: of 2/ 2015  . CHOLECYSTECTOMY  1970's  . CORONARY ANGIOGRAM     Hx: of 2/ 2015  . DILATION AND CURETTAGE OF UTERUS  1960's  . KNEE ARTHROSCOPY Bilateral   . LEFT HEART CATHETERIZATION WITH CORONARY ANGIOGRAM N/A 12/13/2013   Procedure: LEFT HEART CATHETERIZATION WITH CORONARY ANGIOGRAM;  Surgeon: Burnell Blanks, MD;  Location: [facility] CATH LAB;  Service: Cardiovascular;  Laterality: N/A;  . LOBECTOMY Left 12/15/2013   Procedure: LOBECTOMY;  Surgeon: Grace Isaac, MD;  Location: [place];  Service: Thoracic;  Laterality: Left;  . PERCUTANEOUS STENT INTERVENTION Right 11/08/2013   Procedure: PERCUTANEOUS STENT INTERVENTION;  Surgeon: Lorretta Harp, MD;  Location: [facility] CATH LAB;  Service: Cardiovascular;  Laterality: Right;  rt renal artery stent  . RENAL ANGIOGRAM Bilateral 11/08/2013   Procedure: RENAL ANGIOGRAM;  Surgeon: Lorretta Harp, MD;  Location: [facility] CATH LAB;  Service: Cardiovascular;  Laterality: Bilateral;  . RENAL ARTERY STENT Right 11/08/2013   Archie Endo 11/08/2013  . SKIN CANCER EXCISION    . TONSILLECTOMY AND ADENOIDECTOMY  ?1946  . VAGINAL HYSTERECTOMY  1970  . VIDEO ASSISTED THORACOSCOPY (VATS)/WEDGE RESECTION Left 12/15/2013   Procedure: VIDEO ASSISTED THORACOSCOPY (VATS)/WEDGE RESECTION;  Surgeon: Grace Isaac, MD;  Location: [place];  Service: Thoracic;  Laterality: Left;  .  VIDEO BRONCHOSCOPY N/A 12/15/2013   Procedure: VIDEO BRONCHOSCOPY;  Surgeon: Grace Isaac, MD;  Location: [facility] OR;  Service: Thoracic;  Laterality: N/A;    There were no vitals filed for this visit.      Subjective Assessment - 09/16/16 1445    Subjective Pt reports she skipped water aerobics this morning and feels much better.   Patient Stated Goals "To be able to walk w/o my cane"   Currently in Pain? Yes   Pain Score 1    Pain Location Knee   Pain Orientation Right;Left            [facility] PT Assessment - 09/16/16 1445      Assessment    Medical Diagnosis Gait instability; B knee OA   Referring Provider Dene Gentry, MD   Onset Date/Surgical Date --  ~2 yrs   Next MD Visit --     Strength   Right Hip Flexion 4/5   Right Hip Extension 4/5   Right Hip ABduction 4+/5   Right Hip ADduction 4/5   Left Hip Flexion 4/5   Left Hip Extension 4/5   Left Hip ABduction 4+/5   Left Hip ADduction 4/5   Right Knee Flexion 4+/5   Right Knee Extension 4+/5   Left Knee Flexion 4+/5   Left Knee Extension 4+/5   Right Ankle Dorsiflexion 5/5   Right Ankle Plantar Flexion 4+/5   Right Ankle Inversion 5/5   Right Ankle Eversion 5/5   Left Ankle Dorsiflexion 5/5   Left Ankle Plantar Flexion 4+/5   Left Ankle Inversion 5/5   Left Ankle Eversion 4+/5     Standardized Balance Assessment   Standardized Balance Assessment 10 meter walk test   10 Meter Walk 2.68 ft/sec  12.22" w/o AD; 2.94 ft/sec - 11.16" with SPC           Today's Treatment  TherEx Rec Bike - lvl 3 x 5'  Standing at treadmill rail:    B 3 way Hip SLR with looped red TB x10    B Sidestepping along rail in slight squat with red TBwith intermittent UE support 5 x 75f    B Heel raises x20    Squat x15           PT Education - 09/16/16 1534    Education provided Yes   Education Details Reintroduced 3 way resisted SLR - red TB provided for HEP, but pt instructed to go back to yellow if resistance proves too strong   Methods Explanation;Demonstration;Handout   Comprehension Verbalized understanding;Returned demonstration          PT Short Term Goals - 06/06/16 1526      PT SHORT TERM GOAL #1   Title Independent with initial HEP by 06/07/16   Status Achieved           PT Long Term Goals - 09/16/16 1501      PT LONG TERM GOAL #1   Title Independent with advanced HEP as indicated by 09/24/16   Status Partially Met  Met for current HEP     PT LONG TERM GOAL #2   Title B hip strength >/= 4/5 for improved gait stability by 09/24/16    Status Achieved     PT LONG TERM GOAL #3   Title Pt will complete TUG in </= 13.5 sec to decrease risk for falls by 07/14/16   Status Achieved     PT LONG TERM GOAL #4   Title  Pt will demonstrate gait speed >/= 2.6 ft/sec to allow for improved safety with community ambulation by 09/24/16   Status Achieved  2.68 ft/sec w/o AD, 2.94 ft/sec with SPC     PT LONG TERM GOAL #5   Title Pt will ambulate community distances w/o SPC w/o evidence of trendelenburg gait pattern by 09/24/16   Status Partially Met  Trendelenburg gait lessening both with and w/o SPC. Pt feeling more confident with trying to go w/o [facility], but has made limited community attempts.               Plan - 09/16/16 1453    Clinical Impression Statement Pt reporting very minimal pain today and feels better overall. B LE strength has improved with overall strength at least 4/5 with MMT although still with some functional trendelenburg with L SLS. Goals now met for gait speed and MMT. Discussed plan for transition to HEP at end of current ceritfication date period, with plan for remaining visits to review/update OTAGO and strengthening HEP.   PT Treatment/Interventions Patient/family education;Therapeutic exercise;Neuromuscular re-education;Gait training;Manual techniques;Taping;Electrical Stimulation;Moist Heat;Cryotherapy;Vasopneumatic Device;Therapeutic activities;ADLs/Self Care Home Management   PT Next Visit Plan Provide full OTAGO fall prevention program, indicating current appropriate level of activities and provide education for self-progression   Consulted and Agree with Plan of Care Patient      Patient will benefit from skilled therapeutic intervention in order to improve the following deficits and impairments:  Pain, Decreased strength, Difficulty walking, Abnormal gait, Decreased balance, Decreased activity tolerance, Decreased endurance, Decreased range of motion  Visit Diagnosis: Difficulty in walking, not  elsewhere classified  Other abnormalities of gait and mobility  Muscle weakness (generalized)  Chronic pain of left knee  Chronic pain of right knee     Problem List Patient Active Problem List   Diagnosis Date Noted  . Urinary incontinence 09/16/2016  . Bilateral knee pain 05/02/2016  . Trochanteric bursitis of left hip 01/11/2016  . Low back pain 10/02/2015  . Tremor of both hands 09/21/2014  . Gout due to renal impairment 08/30/2014  . Physical deconditioning 06/23/2014  . UTI (lower urinary tract infection) 06/13/2014  . CKD (chronic kidney disease) stage 3, GFR 30-59 ml/min 05/27/2014  . IgA nephropathy 05/27/2014  . Chronic diastolic heart failure ([place]) 05/03/2014  . Steroid-induced diabetes ([place]) 04/18/2014  . Hematuria 02/25/2014  . Diastolic dysfunction- grade 2 02/16/2014  . Leg edema 02/11/2014  . CAD (coronary artery disease), non obstructive by cardiac cath 12/12/13 01/24/2014  . CKD (chronic kidney disease) stage 4, GFR 15-29 ml/min (H[facility]) 01/03/2014  . Lung cancer, left lower lobe 12/17/2013  . Status post lobectomy of lung 12/15/2013  . Chest pain with moderate risk of acute coronary syndrome 12/12/2013  . Family history of coronary artery bypass surgery 12/12/2013  . Renal artery atherosclerosis, s/p right RA stent 11/08/13 11/08/2013  . Lung nodule/ adenoca > LLobectomy 12/15/13  11/08/2013  . Premature atrial contractions 10/31/2013  . Hearing loss secondary to cerumen impaction 09/20/2013  . Allergic rhinitis 02/11/2013  . Shoulder pain 03/11/2012  . Upper arm pain 01/26/2012  . General medical examination 07/25/2011  . Obesity- BMI 41 05/10/2011  . Dizziness 04/10/2011  . Hyperlipidemia 02/28/2011  . EDEMA- LOCALIZED 06/04/2010  . OSTEOPENIA 01/15/2010  . CARCINOMA, BASAL CELL 09/22/2009  . ESOPHAGITIS 05/19/2009  . GASTRIC ULCER 05/19/2009  . HIATAL HERNIA 05/19/2009  . DEGENERATIVE JOINT DISEASE 05/19/2009  . Hypothyroidism 03/23/2009  .  Essential hypertension 03/23/2009  . ASTHMA 03/23/2009  .  GERD 03/23/2009  . COLONIC POLYPS, HX OF 03/23/2009    Percival Spanish, PT, MPT 09/16/2016, 5:00 PM  [facility] [address]  [place], [place], [address] Phone: [phone]   Fax:  [phone]  Name: Susan Pittman MRN: [id number] Date of Birth: 1941-05-27

## 2016-09-18 ENCOUNTER — Ambulatory Visit: Payer: Medicare Other

## 2016-09-18 DIAGNOSIS — R2689 Other abnormalities of gait and mobility: Secondary | ICD-10-CM

## 2016-09-18 DIAGNOSIS — M25561 Pain in right knee: Secondary | ICD-10-CM | POA: Diagnosis not present

## 2016-09-18 DIAGNOSIS — G8929 Other chronic pain: Secondary | ICD-10-CM | POA: Diagnosis not present

## 2016-09-18 DIAGNOSIS — M6281 Muscle weakness (generalized): Secondary | ICD-10-CM

## 2016-09-18 DIAGNOSIS — M25562 Pain in left knee: Secondary | ICD-10-CM | POA: Diagnosis not present

## 2016-09-18 DIAGNOSIS — R262 Difficulty in walking, not elsewhere classified: Secondary | ICD-10-CM | POA: Diagnosis not present

## 2016-09-18 NOTE — Therapy (Signed)
[facility] [address]  [place], [place], [address] Phone: [phone]   Fax:  [phone]  Physical Therapy Treatment  Patient Details  Name: Susan Pittman MRN: [id number] Date of Birth: 18-Dec-1940 Referring Provider: Dene Gentry, MD  Encounter Date: 09/18/2016      PT End of Session - 09/18/16 1501    Visit Number 22   Number of Visits 26   Date for PT Re-Evaluation 09/24/16   Authorization Type Medicare primary   PT Start Time 1447   PT Stop Time 1530   PT Time Calculation (min) 43 min   Activity Tolerance Patient tolerated treatment well;Patient limited by fatigue   Behavior During Therapy [facility] for tasks assessed/performed      Past Medical History:  Diagnosis Date  . Allergy   . Arthritis    "knees, hips, back, hands" (11/08/2013)  . Asthma   . Basal cell carcinoma    "cut them off face & right shoulder" (11/08/2013)  . CAD (coronary artery disease), non obstructive by cardiac cath 12/12/13 01/24/2014  . CKD (chronic kidney disease), stage III   . Diastolic CHF ([place])   . DJD (degenerative joint disease)   . Gastric ulcer   . GERD (gastroesophageal reflux disease)   . Hiatal hernia   . Hyperlipidemia    "don't take RX for it" (11/08/2013)  . Hypertension   . Hypothyroid   . Lung mass    superior segment LLL/notes 11/08/2013  . Lung nodule/ adenoca > LLobectomy 12/15/13  11/08/2013   PET 11/23/2013 1. Dominant sub solid left lower lobe nodule does not demonstrate significantly increased metabolic activity. However, morphologically, adenocarcinoma is still a concern.   - Spirometry 11/24/13 wnl  - 11/24/2013 T surg eval rec> LLower lobectomy 12/15/2013 for adenoca   . Nephropathy   . Osteoporosis   . Pneumonia    "I've had it ?3 times" (11/08/2013)  . Renal artery stenosis ([place])    with stent placement    Past Surgical History:  Procedure Laterality Date  . ANTERIOR CERVICAL DECOMP/DISCECTOMY FUSION   2000   C5-C6  . CARDIAC CATHETERIZATION     Hx: of 2/ 2015  . CHOLECYSTECTOMY  1970's  . CORONARY ANGIOGRAM     Hx: of 2/ 2015  . DILATION AND CURETTAGE OF UTERUS  1960's  . KNEE ARTHROSCOPY Bilateral   . LEFT HEART CATHETERIZATION WITH CORONARY ANGIOGRAM N/A 12/13/2013   Procedure: LEFT HEART CATHETERIZATION WITH CORONARY ANGIOGRAM;  Surgeon: Burnell Blanks, MD;  Location[facility] CATH LAB;  Service: Cardiovascular;  Laterality: N/A;  . LOBECTOMY Left 12/15/2013   Procedure: LOBECTOMY;  Surgeon: Grace Isaac, MD;  Location: [place];  Service: Thoracic;  Laterality: Left;  . PERCUTANEOUS STENT INTERVENTION Right 11/08/2013   Procedure: PERCUTANEOUS STENT INTERVENTION;  Surgeon: Lorretta Harp, MD;  Location: [facility] CATH LAB;  Service: Cardiovascular;  Laterality: Right;  rt renal artery stent  . RENAL ANGIOGRAM Bilateral 11/08/2013   Procedure: RENAL ANGIOGRAM;  Surgeon: Lorretta Harp, MD;  Location[facility] CATH LAB;  Service: Cardiovascular;  Laterality: Bilateral;  . RENAL ARTERY STENT Right 11/08/2013   Archie Endo 11/08/2013  . SKIN CANCER EXCISION    . TONSILLECTOMY AND ADENOIDECTOMY  ?1946  . VAGINAL HYSTERECTOMY  1970  . VIDEO ASSISTED THORACOSCOPY (VATS)/WEDGE RESECTION Left 12/15/2013   Procedure: VIDEO ASSISTED THORACOSCOPY (VATS)/WEDGE RESECTION;  Surgeon: Grace Isaac, MD;  Location: [place];  Service: Thoracic;  Laterality: Left;  .  VIDEO BRONCHOSCOPY N/A 12/15/2013   Procedure: VIDEO BRONCHOSCOPY;  Surgeon: Grace Isaac, MD;  Location: [facility] OR;  Service: Thoracic;  Laterality: N/A;    There were no vitals filed for this visit.      Subjective Assessment - 09/18/16 1449    Subjective Pt. reports she has been walking without cane for 3 days now and is feeling comfortable with this.   Patient Stated Goals "To be able to walk w/o my cane"   Currently in Pain? No/denies   Pain Score 0-No pain   Multiple Pain Sites No            Balance Exercises - 09/18/16 1512       OTAGO PROGRAM   Head Movements Standing;5 reps   Neck Movements Standing;5 reps   Back Extension Standing;5 reps   Trunk Movements Standing;5 reps   Ankle Movements Sitting;10 reps   Knee Extensor 10 reps   Knee Flexor 10 reps  no weight    Hip ABductor 10 reps   Ankle Plantorflexors 20 reps, no support   Ankle Dorsiflexors 20 reps, support   Knee Bends 10 reps, no support   Backwards Walking Support   Walking and Turning Around No assistive device   Sideways Walking Assistive device   Tandem Stance 10 seconds, support   Tandem Walk Support   One Leg Stand 10 seconds, support   Heel Walking Support   Toe Walk No support   Heel Toe Walking Backward No support   Sit to Stand 10 reps, no support           PT Education - 09/18/16 1555    Education provided Yes   Education Details Reintroduced OTAGO balance program: all OTAGO balance activities provided to pt. with paper handout except stair climbing and backwards walk (no support)   Person(s) Educated Patient   Methods Explanation;Demonstration;Handout   Comprehension Verbalized understanding;Returned demonstration;Verbal cues required          PT Short Term Goals - 06/06/16 1526      PT SHORT TERM GOAL #1   Title Independent with initial HEP by 06/07/16   Status Achieved           PT Long Term Goals - 09/18/16 1600      PT LONG TERM GOAL #1   Title Independent with advanced HEP as indicated by 09/24/16   Status Achieved     PT LONG TERM GOAL #2   Title B hip strength >/= 4/5 for improved gait stability by 09/24/16   Status Achieved     PT LONG TERM GOAL #3   Title Pt will complete TUG in </= 13.5 sec to decrease risk for falls by 07/14/16   Status Achieved     PT LONG TERM GOAL #4   Title Pt will demonstrate gait speed >/= 2.6 ft/sec to allow for improved safety with community ambulation by 09/24/16   Status Achieved  2.68 ft/sec w/o AD, 2.94 ft/sec with SPC     PT LONG TERM GOAL #5   Title Pt will  ambulate community distances w/o SPC w/o evidence of trendelenburg gait pattern by 09/24/16   Status Partially Met  Trendelenburg gait lessening both with and w/o SPC. Pt feeling more confident with trying to go w/o [facility], but has made limited community attempts.               Plan - 09/18/16 1456    Clinical Impression Statement Pt. seen today ambulating without SPC  with good stability.  Pt. reporting she has been walking without cane for a few days now and feels comfortable with this.  Today's treatment focused on review and demonstration of OTAGO Balance program to determine appropriate activities.  Updated [place] balance HEP issued to pt. via handout.  Pt. reporting, she plans to discontinue water aerobics and continue chair aerobics and combine therapy HEP for post-d/c program.  Pt. verbalized that she is comfortable transitioning to home program today and anticipates d/c next visit pending discussion with PT.   PT Treatment/Interventions Patient/family education;Therapeutic exercise;Neuromuscular re-education;Gait training;Manual techniques;Taping;Electrical Stimulation;Moist Heat;Cryotherapy;Vasopneumatic Device;Therapeutic activities;ADLs/Self Care Home Management   PT Next Visit Plan Continue preparing pt. for post-d/c program; dc if appropriate      Patient will benefit from skilled therapeutic intervention in order to improve the following deficits and impairments:  Pain, Decreased strength, Difficulty walking, Abnormal gait, Decreased balance, Decreased activity tolerance, Decreased endurance, Decreased range of motion  Visit Diagnosis: Difficulty in walking, not elsewhere classified  Other abnormalities of gait and mobility  Muscle weakness (generalized)  Chronic pain of left knee  Chronic pain of right knee     Problem List Patient Active Problem List   Diagnosis Date Noted  . Urinary incontinence 09/16/2016  . Bilateral knee pain 05/02/2016  . Trochanteric  bursitis of left hip 01/11/2016  . Low back pain 10/02/2015  . Tremor of both hands 09/21/2014  . Gout due to renal impairment 08/30/2014  . Physical deconditioning 06/23/2014  . UTI (lower urinary tract infection) 06/13/2014  . CKD (chronic kidney disease) stage 3, GFR 30-59 ml/min 05/27/2014  . IgA nephropathy 05/27/2014  . Chronic diastolic heart failure ([place]) 05/03/2014  . Steroid-induced diabetes ([place]) 04/18/2014  . Hematuria 02/25/2014  . Diastolic dysfunction- grade 2 02/16/2014  . Leg edema 02/11/2014  . CAD (coronary artery disease), non obstructive by cardiac cath 12/12/13 01/24/2014  . CKD (chronic kidney disease) stage 4, GFR 15-29 ml/min (HCC) 01/03/2014  . Lung cancer, left lower lobe 12/17/2013  . Status post lobectomy of lung 12/15/2013  . Chest pain with moderate risk of acute coronary syndrome 12/12/2013  . Family history of coronary artery bypass surgery 12/12/2013  . Renal artery atherosclerosis, s/p right RA stent 11/08/13 11/08/2013  . Lung nodule/ adenoca > LLobectomy 12/15/13  11/08/2013  . Premature atrial contractions 10/31/2013  . Hearing loss secondary to cerumen impaction 09/20/2013  . Allergic rhinitis 02/11/2013  . Shoulder pain 03/11/2012  . Upper arm pain 01/26/2012  . General medical examination 07/25/2011  . Obesity- BMI 41 05/10/2011  . Dizziness 04/10/2011  . Hyperlipidemia 02/28/2011  . EDEMA- LOCALIZED 06/04/2010  . OSTEOPENIA 01/15/2010  . CARCINOMA, BASAL CELL 09/22/2009  . ESOPHAGITIS 05/19/2009  . GASTRIC ULCER 05/19/2009  . HIATAL HERNIA 05/19/2009  . DEGENERATIVE JOINT DISEASE 05/19/2009  . Hypothyroidism 03/23/2009  . Essential hypertension 03/23/2009  . ASTHMA 03/23/2009  . GERD 03/23/2009  . COLONIC POLYPS, HX OF 03/23/2009    Bess Harvest, PTA 09/18/16 4:04 PM  [facility] [address]  [place], [place], [address] Phone: [phone]   Fax:  [phone]  Name:  Susan Pittman MRN: [id number] Date of Birth: June 23, 1941

## 2016-09-19 DIAGNOSIS — I701 Atherosclerosis of renal artery: Secondary | ICD-10-CM | POA: Diagnosis not present

## 2016-09-19 DIAGNOSIS — R93429 Abnormal radiologic findings on diagnostic imaging of unspecified kidney: Secondary | ICD-10-CM | POA: Diagnosis not present

## 2016-09-19 DIAGNOSIS — N028 Recurrent and persistent hematuria with other morphologic changes: Secondary | ICD-10-CM | POA: Diagnosis not present

## 2016-09-19 DIAGNOSIS — R918 Other nonspecific abnormal finding of lung field: Secondary | ICD-10-CM | POA: Diagnosis not present

## 2016-09-19 DIAGNOSIS — R32 Unspecified urinary incontinence: Secondary | ICD-10-CM | POA: Diagnosis not present

## 2016-09-19 DIAGNOSIS — Z6841 Body Mass Index (BMI) 40.0 and over, adult: Secondary | ICD-10-CM | POA: Diagnosis not present

## 2016-09-19 DIAGNOSIS — I129 Hypertensive chronic kidney disease with stage 1 through stage 4 chronic kidney disease, or unspecified chronic kidney disease: Secondary | ICD-10-CM | POA: Diagnosis not present

## 2016-09-19 DIAGNOSIS — N184 Chronic kidney disease, stage 4 (severe): Secondary | ICD-10-CM | POA: Diagnosis not present

## 2016-09-23 ENCOUNTER — Ambulatory Visit: Payer: Medicare Other | Admitting: Physical Therapy

## 2016-09-23 DIAGNOSIS — R262 Difficulty in walking, not elsewhere classified: Secondary | ICD-10-CM | POA: Diagnosis not present

## 2016-09-23 DIAGNOSIS — M6281 Muscle weakness (generalized): Secondary | ICD-10-CM

## 2016-09-23 DIAGNOSIS — M25562 Pain in left knee: Secondary | ICD-10-CM

## 2016-09-23 DIAGNOSIS — D225 Melanocytic nevi of trunk: Secondary | ICD-10-CM | POA: Diagnosis not present

## 2016-09-23 DIAGNOSIS — R2689 Other abnormalities of gait and mobility: Secondary | ICD-10-CM

## 2016-09-23 DIAGNOSIS — M25561 Pain in right knee: Secondary | ICD-10-CM | POA: Diagnosis not present

## 2016-09-23 DIAGNOSIS — L814 Other melanin hyperpigmentation: Secondary | ICD-10-CM | POA: Diagnosis not present

## 2016-09-23 DIAGNOSIS — L821 Other seborrheic keratosis: Secondary | ICD-10-CM | POA: Diagnosis not present

## 2016-09-23 DIAGNOSIS — Z23 Encounter for immunization: Secondary | ICD-10-CM | POA: Diagnosis not present

## 2016-09-23 DIAGNOSIS — G8929 Other chronic pain: Secondary | ICD-10-CM

## 2016-09-23 DIAGNOSIS — Z86018 Personal history of other benign neoplasm: Secondary | ICD-10-CM | POA: Diagnosis not present

## 2016-09-23 DIAGNOSIS — D1801 Hemangioma of skin and subcutaneous tissue: Secondary | ICD-10-CM | POA: Diagnosis not present

## 2016-09-23 DIAGNOSIS — Z85828 Personal history of other malignant neoplasm of skin: Secondary | ICD-10-CM | POA: Diagnosis not present

## 2016-09-23 NOTE — Therapy (Signed)
[facility] [address]  [place], [place], [address] Phone: [phone]   Fax:  [phone]  Physical Therapy Treatment  Patient Details  Name: Susan Pittman MRN: [id number] Date of Birth: 03-Apr-1941 Referring Provider: Dene Gentry, MD  Encounter Date: 09/23/2016      PT End of Session - 09/23/16 1455    Visit Number 23   Number of Visits 26   Date for PT Re-Evaluation 09/24/16   Authorization Type Medicare primary   PT Start Time 1448   PT Stop Time 1519   PT Time Calculation (min) 31 min   Activity Tolerance Patient tolerated treatment well   Behavior During Therapy [facility] for tasks assessed/performed      Past Medical History:  Diagnosis Date  . Allergy   . Arthritis    "knees, hips, back, hands" (11/08/2013)  . Asthma   . Basal cell carcinoma    "cut them off face & right shoulder" (11/08/2013)  . CAD (coronary artery disease), non obstructive by cardiac cath 12/12/13 01/24/2014  . CKD (chronic kidney disease), stage III   . Diastolic CHF ([place])   . DJD (degenerative joint disease)   . Gastric ulcer   . GERD (gastroesophageal reflux disease)   . Hiatal hernia   . Hyperlipidemia    "don't take RX for it" (11/08/2013)  . Hypertension   . Hypothyroid   . Lung mass    superior segment LLL/notes 11/08/2013  . Lung nodule/ adenoca > LLobectomy 12/15/13  11/08/2013   PET 11/23/2013 1. Dominant sub solid left lower lobe nodule does not demonstrate significantly increased metabolic activity. However, morphologically, adenocarcinoma is still a concern.   - Spirometry 11/24/13 wnl  - 11/24/2013 T surg eval rec> LLower lobectomy 12/15/2013 for adenoca   . Nephropathy   . Osteoporosis   . Pneumonia    "I've had it ?3 times" (11/08/2013)  . Renal artery stenosis ([place])    with stent placement    Past Surgical History:  Procedure Laterality Date  . ANTERIOR CERVICAL DECOMP/DISCECTOMY FUSION  2000   C5-C6  . CARDIAC  CATHETERIZATION     Hx: of 2/ 2015  . CHOLECYSTECTOMY  1970's  . CORONARY ANGIOGRAM     Hx: of 2/ 2015  . DILATION AND CURETTAGE OF UTERUS  1960's  . KNEE ARTHROSCOPY Bilateral   . LEFT HEART CATHETERIZATION WITH CORONARY ANGIOGRAM N/A 12/13/2013   Procedure: LEFT HEART CATHETERIZATION WITH CORONARY ANGIOGRAM;  Surgeon: Burnell Blanks, MD;  Location: [facility] CATH LAB;  Service: Cardiovascular;  Laterality: N/A;  . LOBECTOMY Left 12/15/2013   Procedure: LOBECTOMY;  Surgeon: Grace Isaac, MD;  Location: [place];  Service: Thoracic;  Laterality: Left;  . PERCUTANEOUS STENT INTERVENTION Right 11/08/2013   Procedure: PERCUTANEOUS STENT INTERVENTION;  Surgeon: Lorretta Harp, MD;  Location: [facility] CATH LAB;  Service: Cardiovascular;  Laterality: Right;  rt renal artery stent  . RENAL ANGIOGRAM Bilateral 11/08/2013   Procedure: RENAL ANGIOGRAM;  Surgeon: Lorretta Harp, MD;  Location: [facility] CATH LAB;  Service: Cardiovascular;  Laterality: Bilateral;  . RENAL ARTERY STENT Right 11/08/2013   Archie Endo 11/08/2013  . SKIN CANCER EXCISION    . TONSILLECTOMY AND ADENOIDECTOMY  ?1946  . VAGINAL HYSTERECTOMY  1970  . VIDEO ASSISTED THORACOSCOPY (VATS)/WEDGE RESECTION Left 12/15/2013   Procedure: VIDEO ASSISTED THORACOSCOPY (VATS)/WEDGE RESECTION;  Surgeon: Grace Isaac, MD;  Location: [place];  Service: Thoracic;  Laterality: Left;  Marland Kitchen VIDEO  BRONCHOSCOPY N/A 12/15/2013   Procedure: VIDEO BRONCHOSCOPY;  Surgeon: Grace Isaac, MD;  Location: [facility] OR;  Service: Thoracic;  Laterality: N/A;    There were no vitals filed for this visit.      Subjective Assessment - 09/23/16 1452    Subjective Pt reports she has used her cane since the last time she was here for PT. Notes her L hip still has tendencey to drop when tired, but no pain. Able to do a full grocery shopping trip w/o cane but did note some fatigue upon returning home.   Patient Stated Goals "To be able to walk w/o my cane"   Currently in Pain? No/denies             [facility] PT Assessment - 09/23/16 1448      Assessment   Medical Diagnosis Gait instability; B knee OA   Referring Provider Dene Gentry, MD   Onset Date/Surgical Date --  ~2 yrs     Observation/Other Assessments   Focus on Therapeutic Outcomes (FOTO)  58% (42% limitation)     Strength   Right Hip Flexion 4+/5   Right Hip Extension 4/5   Right Hip ABduction 4+/5   Right Hip ADduction 4/5   Left Hip Flexion 4+/5   Left Hip Extension 4/5   Left Hip ABduction 4+/5   Left Hip ADduction 4+/5   Right Knee Flexion 4+/5   Right Knee Extension 4+/5   Left Knee Flexion 4+/5   Left Knee Extension 4+/5   Right Ankle Dorsiflexion 5/5   Right Ankle Plantar Flexion 4+/5   Right Ankle Inversion 5/5   Right Ankle Eversion 5/5   Left Ankle Dorsiflexion 5/5   Left Ankle Plantar Flexion 4+/5   Left Ankle Inversion 5/5   Left Ankle Eversion 4+/5     Standardized Balance Assessment   Standardized Balance Assessment Timed Up and Go Test;10 meter walk test   10 Meter Walk 2.78 ft/sec  11.78" w/o AD     Timed Up and Go Test   TUG Normal TUG   Normal TUG (seconds) 11.13  w/o AD                 Balance Exercises - 09/23/16 1448      OTAGO PROGRAM   Sideways Walking No assistive device   Tandem Stance 10 seconds, no support   Tandem Walk Support  intermittent support   One Leg Stand 10 seconds, support  intermittent support   Heel Walking No support   Toe Walk Support  intermittent support d/t pain in arches   Heel Toe Walking Backward No support             PT Short Term Goals - 06/06/16 1526      PT SHORT TERM GOAL #1   Title Independent with initial HEP by 06/07/16   Status Achieved           PT Long Term Goals - 09/23/16 1502      PT LONG TERM GOAL #1   Title Independent with advanced HEP as indicated by 09/24/16   Status Achieved     PT LONG TERM GOAL #2   Title B hip strength >/= 4/5 for improved gait stability by 09/24/16    Status Achieved     PT LONG TERM GOAL #3   Title Pt will complete TUG in </= 13.5 sec to decrease risk for falls by 07/14/16   Status Achieved     PT  LONG TERM GOAL #4   Title Pt will demonstrate gait speed >/= 2.6 ft/sec to allow for improved safety with community ambulation by 09/24/16   Status Achieved  2.68 ft/sec w/o AD, 2.94 ft/sec with SPC     PT LONG TERM GOAL #5   Title Pt will ambulate community distances w/o SPC w/o evidence of trendelenburg gait pattern by 09/24/16   Status Partially Met  Trendelenburg gait lessening both with and w/o SPC. Pt comfortable with community ambulation w/o cane but does note increase in trendelenburg gait with fatigue.               Plan - 10-11-16 1520    Clinical Impression Statement Pt has demonstrated good progress with PT and currently denies any knee pain. She is now consistently able to ambulate w/o SPC, although still does note some return of trendelenburg gait when fatigued. B LE strength has improved with overall strength grossly 4+/5. Gait speed has improved to safe community ambulation speed and TUG time no longer indicates an increased risk for falls. Pt has been instructed in [place] fall prevention program and is independent with her strengthening HEP. All goals met at this time with exception of LTG #5 only partially met due to return of trendelenburg gait with fatigue on longer community distances. Pt pleased with progress and feels ready to transition to HEP, therefore will proceed with D/C from PT for this episode.   Clinical Impairments Affecting Rehab Potential Recent deconditioning due to prednisone dosing for gout, obesity   PT Treatment/Interventions Patient/family education;Therapeutic exercise;Neuromuscular re-education;Gait training;Manual techniques;Taping;Electrical Stimulation;Moist Heat;Cryotherapy;Vasopneumatic Device;Therapeutic activities;ADLs/Self Care Home Management   PT Next Visit Plan Discharge   Consulted and  Agree with Plan of Care Patient      Patient will benefit from skilled therapeutic intervention in order to improve the following deficits and impairments:  Pain, Decreased strength, Difficulty walking, Abnormal gait, Decreased balance, Decreased activity tolerance, Decreased endurance, Decreased range of motion  Visit Diagnosis: Difficulty in walking, not elsewhere classified  Other abnormalities of gait and mobility  Muscle weakness (generalized)  Chronic pain of left knee  Chronic pain of right knee       G-Codes - Oct 11, 2016 1503    Functional Assessment Tool Used TUG = 11.13 sec (w/o AD) + Clinical judgement   Functional Limitation Mobility: Walking and moving around   Mobility: Walking and Moving Around Goal Status [phone]) At least 20 percent but less than 40 percent impaired, limited or restricted   Mobility: Walking and Moving Around Discharge Status [phone]) At least 20 percent but less than 40 percent impaired, limited or restricted      Problem List Patient Active Problem List   Diagnosis Date Noted  . Urinary incontinence 09/16/2016  . Bilateral knee pain 05/02/2016  . Trochanteric bursitis of left hip 01/11/2016  . Low back pain 10/02/2015  . Tremor of both hands 09/21/2014  . Gout due to renal impairment 08/30/2014  . Physical deconditioning 06/23/2014  . UTI (lower urinary tract infection) 06/13/2014  . CKD (chronic kidney disease) stage 3, GFR 30-59 ml/min 05/27/2014  . IgA nephropathy 05/27/2014  . Chronic diastolic heart failure ([place]) 05/03/2014  . Steroid-induced diabetes ([place]) 04/18/2014  . Hematuria 02/25/2014  . Diastolic dysfunction- grade 2 02/16/2014  . Leg edema 02/11/2014  . CAD (coronary artery disease), non obstructive by cardiac cath 12/12/13 01/24/2014  . CKD (chronic kidney disease) stage 4, GFR 15-29 ml/min (HCC) 01/03/2014  . Lung cancer, left lower lobe 12/17/2013  .  Status post lobectomy of lung 12/15/2013  . Chest pain with moderate  risk of acute coronary syndrome 12/12/2013  . Family history of coronary artery bypass surgery 12/12/2013  . Renal artery atherosclerosis, s/p right RA stent 11/08/13 11/08/2013  . Lung nodule/ adenoca > LLobectomy 12/15/13  11/08/2013  . Premature atrial contractions 10/31/2013  . Hearing loss secondary to cerumen impaction 09/20/2013  . Allergic rhinitis 02/11/2013  . Shoulder pain 03/11/2012  . Upper arm pain 01/26/2012  . General medical examination 07/25/2011  . Obesity- BMI 41 05/10/2011  . Dizziness 04/10/2011  . Hyperlipidemia 02/28/2011  . EDEMA- LOCALIZED 06/04/2010  . OSTEOPENIA 01/15/2010  . CARCINOMA, BASAL CELL 09/22/2009  . ESOPHAGITIS 05/19/2009  . GASTRIC ULCER 05/19/2009  . HIATAL HERNIA 05/19/2009  . DEGENERATIVE JOINT DISEASE 05/19/2009  . Hypothyroidism 03/23/2009  . Essential hypertension 03/23/2009  . ASTHMA 03/23/2009  . GERD 03/23/2009  . COLONIC POLYPS, HX OF 03/23/2009    Percival Spanish, PT, MPT 09/23/2016, 3:51 PM  [facility] [address]  [place], [place], [address] Phone: [phone]   Fax:  [phone]  Name: Susan Pittman MRN: [id number] Date of Birth: May 31, 1941   PHYSICAL THERAPY DISCHARGE SUMMARY  Visits from Start of Care: 23  Current functional level related to goals / functional outcomes:   Refer to above clinical impression.   Remaining deficits:   As above. Pt to continue with HEP and [place]TAG[place] fall prevention program.   Education / Equipment:   HEP & OTAG[place] fall prevention program  Plan: Patient agrees to discharge.  Patient goals were partially met. Patient is being discharged due to being pleased with the current functional level.  ?????    Percival Spanish, PT, MPT 09/23/16, 3:51 PM  [facility] [address]  [place], [place], [address] Phone: [phone]   Fax:  [phone]

## 2016-09-25 ENCOUNTER — Telehealth: Payer: Self-pay | Admitting: Emergency Medicine

## 2016-09-25 ENCOUNTER — Other Ambulatory Visit: Payer: Self-pay | Admitting: Family Medicine

## 2016-09-25 ENCOUNTER — Encounter: Payer: Self-pay | Admitting: Family Medicine

## 2016-09-25 ENCOUNTER — Other Ambulatory Visit: Payer: Self-pay | Admitting: *Deleted

## 2016-09-25 MED ORDER — SERTRALINE HCL 50 MG PO TABS: ORAL_TABLET | ORAL | 0 refills | Status: DC

## 2016-09-25 MED ORDER — LEVOTHYROXINE SODIUM 88 MCG PO TABS: ORAL_TABLET | ORAL | 0 refills | Status: DC

## 2016-09-25 MED ORDER — ALLOPURINOL 100 MG PO TABS: 100.0000 mg | ORAL_TABLET | Freq: Two times a day (BID) | ORAL | 0 refills | Status: DC

## 2016-09-25 MED ORDER — FUROSEMIDE 40 MG PO TABS: ORAL_TABLET | ORAL | 0 refills | Status: DC

## 2016-10-01 NOTE — Telephone Encounter (Signed)
Open in error

## 2016-10-02 ENCOUNTER — Encounter: Payer: Self-pay | Admitting: Family Medicine

## 2016-10-02 MED ORDER — HYDRALAZINE HCL 50 MG PO TABS: 50.0000 mg | ORAL_TABLET | Freq: Two times a day (BID) | ORAL | 1 refills | Status: DC

## 2016-10-04 ENCOUNTER — Other Ambulatory Visit: Payer: Self-pay | Admitting: *Deleted

## 2016-10-04 ENCOUNTER — Encounter: Payer: Self-pay | Admitting: Cardiovascular Disease

## 2016-10-04 MED ORDER — FUROSEMIDE 80 MG PO TABS: 80.0000 mg | ORAL_TABLET | Freq: Every morning | ORAL | 2 refills | Status: DC

## 2016-10-07 ENCOUNTER — Telehealth: Payer: Self-pay | Admitting: Cardiovascular Disease

## 2016-10-07 NOTE — Telephone Encounter (Signed)
Pharmacist states there is a duplicate for Furosemide. Please call

## 2016-10-07 NOTE — Telephone Encounter (Addendum)
S/w Dorothea Ogle at [facility]. Lasix '80mg'$  qd prescription was refilled by Dr. Fletcher Anon; she has lasix '40mg'$  qd  refilled by Dr. [name], PCP.  09/16/16 labs at PCP show elevated BUN and creatinine.  I have left message on pt home VM to call back as we need clarification of which prescription she is taking.

## 2016-10-08 ENCOUNTER — Other Ambulatory Visit: Payer: Self-pay

## 2016-10-08 MED ORDER — FUROSEMIDE 40 MG PO TABS: 40.0000 mg | ORAL_TABLET | Freq: Two times a day (BID) | ORAL | 2 refills | Status: DC

## 2016-10-08 MED ORDER — NEBIVOLOL HCL 10 MG PO TABS: 10.0000 mg | ORAL_TABLET | Freq: Every day | ORAL | 2 refills | Status: DC

## 2016-10-08 NOTE — Telephone Encounter (Signed)
Reviewed MD recommendations w/pt who verbalized understanding. Medication list updated; prescriptions submitted to [facility]

## 2016-10-08 NOTE — Telephone Encounter (Signed)
I reviewed her labs. Her most recent renal function was slightly worse than before. We should try the lower dose of Lasix 40 mg bid. Decrease Bystolic to 10 mg daily.

## 2016-10-08 NOTE — Telephone Encounter (Signed)
Pt called to clarify lasix dosage as she has two different prescriptions. Reports she has been taking '80mg'$  in the AM; '40mg'$  in the PM for 2 years. She contacted PCP in November for refills and was told to contact cardiology.  She sees Dr. Fletcher Anon for renal artery stenosis s/p  2015 renal artery stenting.  November 13 labs at PCP show increased elevated BUN and creatinine from May labs.   Pt also inquires if she can cut bystolic from '20mg'$  to '10mg'$  as HR runs in the 50s and she feels weak; BP 120s/70s. She also takes hydralazine '50mg'$  BID. Meds appear to be monitored by PCP but she would like Dr. Tyrell Antonio input.  Forward to MD to review and advise.

## 2016-10-22 ENCOUNTER — Ambulatory Visit (INDEPENDENT_AMBULATORY_CARE_PROVIDER_SITE_OTHER): Payer: Medicare Other | Admitting: Family Medicine

## 2016-10-22 ENCOUNTER — Encounter: Payer: Self-pay | Admitting: Family Medicine

## 2016-10-22 VITALS — BP 145/79 | HR 63 | Ht 65.0 in | Wt 236.0 lb

## 2016-10-22 DIAGNOSIS — M25561 Pain in right knee: Secondary | ICD-10-CM | POA: Diagnosis not present

## 2016-10-22 DIAGNOSIS — I701 Atherosclerosis of renal artery: Secondary | ICD-10-CM

## 2016-10-22 DIAGNOSIS — M25562 Pain in left knee: Secondary | ICD-10-CM | POA: Diagnosis not present

## 2016-10-22 DIAGNOSIS — M7062 Trochanteric bursitis, left hip: Secondary | ICD-10-CM | POA: Diagnosis not present

## 2016-10-22 DIAGNOSIS — G8929 Other chronic pain: Secondary | ICD-10-CM

## 2016-10-22 MED ORDER — METHYLPREDNISOLONE ACETATE 40 MG/ML IJ SUSP
40.0000 mg | Freq: Once | INTRAMUSCULAR | Status: AC
  Administered 2016-10-22: 40 mg

## 2016-10-22 MED ORDER — METHYLPREDNISOLONE ACETATE 40 MG/ML IJ SUSP
40.0000 mg | Freq: Once | INTRAMUSCULAR | Status: AC
  Administered 2016-10-22: 40 mg via INTRA_ARTICULAR

## 2016-10-22 NOTE — Patient Instructions (Addendum)
You were given cortisone shots for your left hip and right knee today. We will work on getting approval for the gel shots for both knees and start these after the new year. You will need to get x-rays of your left knee downstairs today (we have ones of the right knee).

## 2016-10-23 NOTE — Progress Notes (Signed)
PCP and consultation requested by: [name], MD  Subjective:   HPI: Patient is a 75 y.o. female here for bilateral knee pain, left hip pain.  3/8: Patient reports she's had off and on pain in left knee and left hip that worsened after coming off her prednisone (for IgA nephropathy). Was on this for two years and just off past two months. Pain in left knee is mainly posterior, 0/10 at rest, up to 10/10 and sharp at times. Worse with ambulation. Has a history of a bakers cyst here. Hip pain is lateral, worse and sharp when lying down on this side. Pain here 0/10 at rest, 7/10 at worst. No skin changes, fever, other complaints.  4/7: Patient reports she feels much better. No pain with walking in knee or hip. Pain level 0/10. Some soreness only to touch in these areas and if lying on the left side. No numbness, skin changes.  6/28: Patient reports both of her knees are hurting now. Pain level is 0/10 at rest, up to 8/10 and very stiff, achy with walking. Using a cane. Most pain anterior knees - radiates some posteriorly. She has pain in ankles as well anteriorly. No numbness, skin changes.  8/23: Patient reports she is doing well today. Had a bad day a couple PT sessions ago. Done about 10 visits so far with PT and made good progress. Doing water aerobics. Using a cane. Not taking any medications - concerned about her kidneys being affected by any medication - has not discussed with nephrologist yet. Pain level 0/10 today. Gets soreness of knees primarily with ambulation. No skin changes, numbness.  12/19: Patient reports her right knee has started bothering her - giving out, pain is 2/10 at rest, dull and anterior. Interested in cortisone shot for knee and getting approval for viscosupplementation. Pain on both hips when lying on sides though left side is worse. No radiation of pain. Not doing home exercises as much as she should. Not taking anything for  pain. No skin changes, numbness.  Past Medical History:  Diagnosis Date  . Allergy   . Arthritis    "knees, hips, back, hands" (11/08/2013)  . Asthma   . Basal cell carcinoma    "cut them off face & right shoulder" (11/08/2013)  . CAD (coronary artery disease), non obstructive by cardiac cath 12/12/13 01/24/2014  . CKD (chronic kidney disease), stage III   . Diastolic CHF ([place])   . DJD (degenerative joint disease)   . Gastric ulcer   . GERD (gastroesophageal reflux disease)   . Hiatal hernia   . Hyperlipidemia    "don't take RX for it" (11/08/2013)  . Hypertension   . Hypothyroid   . Lung mass    superior segment LLL/notes 11/08/2013  . Lung nodule/ adenoca > LLobectomy 12/15/13  11/08/2013   PET 11/23/2013 1. Dominant sub solid left lower lobe nodule does not demonstrate significantly increased metabolic activity. However, morphologically, adenocarcinoma is still a concern.   - Spirometry 11/24/13 wnl  - 11/24/2013 T surg eval rec> LLower lobectomy 12/15/2013 for adenoca   . Nephropathy   . Osteoporosis   . Pneumonia    "I've had it ?3 times" (11/08/2013)  . Renal artery stenosis [facility])    with stent placement    Current Outpatient Prescriptions on File Prior to Visit  Medication Sig Dispense Refill  . albuterol (PROVENTIL HFA;VENTOLIN HFA) 108 (90 BASE) MCG/ACT inhaler Inhale 1-2 puffs into the lungs every 6 (six) hours as needed for  wheezing or shortness of breath. 18 g 1  . allopurinol (ZYLOPRIM) 100 MG tablet Take 1 tablet (100 mg total) by mouth 2 (two) times daily. Takes 2 tablets in am 180 tablet 0  . beclomethasone (QVAR) 80 MCG/ACT inhaler Inhale 2 puffs into the lungs 2 (two) times daily. 1 Inhaler 12  . Calcium Carb-Cholecalciferol 600-800 MG-UNIT TABS Take 2 tablets by mouth daily.     . cetirizine (ZYRTEC) 10 MG tablet Take 10 mg by mouth at bedtime.     . fluticasone (FLONASE) 50 MCG/ACT nasal spray USE TWO SPRAY(S) IN EACH NOSTRIL ONCE DAILY 16 g 2  . furosemide (LASIX) 40 MG  tablet Take 1 tablet (40 mg total) by mouth 2 (two) times daily. 180 tablet 2  . glucose blood (ONE TOUCH ULTRA TEST) test strip Dx E09.9. Please use one strip each time sugars are checked. Pt tests 2 times daily. 100 each 6  . hydrALAZINE (APRESOLINE) 50 MG tablet Take 1 tablet (50 mg total) by mouth 2 (two) times daily. 180 tablet 1  . Lancets (ONETOUCH ULTRASOFT) lancets Use 1 per day dx code 249.00 100 each 12  . lansoprazole (PREVACID) 15 MG capsule Take 15 mg by mouth daily at 12 noon.    Marland Kitchen levothyroxine (SYNTHROID, LEVOTHROID) 88 MCG tablet TAKE ONE TABLET BY MOUTH ONCE DAILY BEFORE  BREAKFAST 90 tablet 0  . ONETOUCH DELICA LANCETS 95J MISC Use to check blood sugar once daily as instructed. Dx code: E11.9 100 each 2  . sertraline (ZOLOFT) 50 MG tablet TAKE ONE & ONE-HALF TABLETS BY MOUTH ONCE DAILY 135 tablet 0  . solifenacin (VESICARE) 5 MG tablet Take 1 tablet (5 mg total) by mouth daily. 30 tablet 3   No current facility-administered medications on file prior to visit.     Past Surgical History:  Procedure Laterality Date  . ANTERIOR CERVICAL DECOMP/DISCECTOMY FUSION  2000   C5-C6  . CARDIAC CATHETERIZATION     Hx: of 2/ 2015  . CHOLECYSTECTOMY  1970's  . CORONARY ANGIOGRAM     Hx: of 2/ 2015  . DILATION AND CURETTAGE OF UTERUS  1960's  . KNEE ARTHROSCOPY Bilateral   . LEFT HEART CATHETERIZATION WITH CORONARY ANGIOGRAM N/A 12/13/2013   Procedure: LEFT HEART CATHETERIZATION WITH CORONARY ANGIOGRAM;  Surgeon: Burnell Blanks, MD;  Location: [facility] CATH LAB;  Service: Cardiovascular;  Laterality: N/A;  . LOBECTOMY Left 12/15/2013   Procedure: LOBECTOMY;  Surgeon: Grace Isaac, MD;  Location: [place];  Service: Thoracic;  Laterality: Left;  . PERCUTANEOUS STENT INTERVENTION Right 11/08/2013   Procedure: PERCUTANEOUS STENT INTERVENTION;  Surgeon: Lorretta Harp, MD;  Location: [facility] CATH LAB;  Service: Cardiovascular;  Laterality: Right;  rt renal artery stent  . RENAL ANGIOGRAM  Bilateral 11/08/2013   Procedure: RENAL ANGIOGRAM;  Surgeon: Lorretta Harp, MD;  Location: [facility] CATH LAB;  Service: Cardiovascular;  Laterality: Bilateral;  . RENAL ARTERY STENT Right 11/08/2013   Archie Endo 11/08/2013  . SKIN CANCER EXCISION    . TONSILLECTOMY AND ADENOIDECTOMY  ?1946  . VAGINAL HYSTERECTOMY  1970  . VIDEO ASSISTED THORACOSCOPY (VATS)/WEDGE RESECTION Left 12/15/2013   Procedure: VIDEO ASSISTED THORACOSCOPY (VATS)/WEDGE RESECTION;  Surgeon: Grace Isaac, MD;  Location: [place];  Service: Thoracic;  Laterality: Left;  Marland Kitchen VIDEO BRONCHOSCOPY N/A 12/15/2013   Procedure: VIDEO BRONCHOSCOPY;  Surgeon: Grace Isaac, MD;  Location: [facility] OR;  Service: Thoracic;  Laterality: N/A;    Allergies  Allergen Reactions  . Benazepril Hcl  Anaphylaxis and Cough  . Loratadine Anaphylaxis    anaphylaxis  . Fexofenadine Other (See Comments)    Severe back pain  . Allegra [Fexofenadine Hcl] Other (See Comments)    Severe back pain  . Celecoxib Other (See Comments)    REACTION: aching  . Lisinopril     REACTION: cough  . Sulfa Antibiotics Hives  . Sulfur Rash    Social History   Social History  . Marital status: Married    Spouse name: N/A  . Number of children: N/A  . Years of education: N/A   Occupational History  . Retired    Social History Main Topics  . Smoking status: Never Smoker  . Smokeless tobacco: Never Used  . Alcohol use No  . Drug use: No  . Sexual activity: Yes   Other Topics Concern  . Not on file   Social History Narrative  . No narrative on file    Family History  Problem Relation Age of Onset  . Breast cancer Sister   . Heart disease Mother   . Emphysema Father     smoked  . Heart disease Father   . Diabetes Father   . Stroke Father   . Allergies Sister   . Allergies Brother   . Diabetes Brother   . Heart failure Neg Hx     BP (!) 145/79   Pulse 63   Ht '5\' 5"'$  (1.651 m)   Wt 236 lb (107 kg)   BMI 39.27 kg/m   Review of Systems: See HPI  above.    Objective:  Physical Exam:  Gen: NAD, comfortable in exam room  Bilateral knees: No gross deformity, ecchymoses, effusions. Mild TTP medial joint lines. FROM. Negative ant/post drawers. Negative valgus/varus testing. Negative lachmanns. Negative mcmurrays, apleys. NV intact distally.  Bilateral hips: No gross deformity, swelling, bruising. TTP L > R trochanteric bursae.  No other tenderness. Mild limitation ROM but no pain with logroll. Hip abduction 3/5 bilaterally, painful.    Assessment & Plan:  1. Bilateral knee pain - 2/2 DJD.  S/p cortisone injections, doing physical therapy and improving well.  Repeated intraarticular injection on right today.  Discussed tylenol, glucosamine, topical medications.  Heat/ice.  Will get approval for viscosupplementation.  After informed written consent, patient was seated on exam table. Right knee was prepped with alcohol swab and utilizing anteromedial approach, patient's right knee was injected intraarticularly with 3:1 bupivicaine: depomedrol. Patient tolerated the procedure well without immediate complications.  2. Bilateral trochanteric bursitis - encouraged to do home exercises and stretches.  Repeated trochanteric bursa injection left hip.  Icing as needed.  After informed written consent patient was lying on right side on exam table.  Area overlying left trochanteric bursa prepped with alcohol swab then injected with 6:2 bupivicaine: depomedrol.  Patient tolerated procedure well without immediate complications.

## 2016-10-24 ENCOUNTER — Ambulatory Visit (INDEPENDENT_AMBULATORY_CARE_PROVIDER_SITE_OTHER): Payer: Medicare Other | Admitting: Family Medicine

## 2016-10-24 ENCOUNTER — Encounter: Payer: Self-pay | Admitting: Family Medicine

## 2016-10-24 VITALS — BP 128/78 | HR 56 | Temp 98.1°F | Resp 16 | Ht 65.0 in | Wt 237.5 lb

## 2016-10-24 DIAGNOSIS — Z8249 Family history of ischemic heart disease and other diseases of the circulatory system: Secondary | ICD-10-CM

## 2016-10-24 DIAGNOSIS — N3941 Urge incontinence: Secondary | ICD-10-CM | POA: Diagnosis not present

## 2016-10-24 DIAGNOSIS — I701 Atherosclerosis of renal artery: Secondary | ICD-10-CM

## 2016-10-24 DIAGNOSIS — Z8489 Family history of other specified conditions: Secondary | ICD-10-CM | POA: Diagnosis not present

## 2016-10-24 MED ORDER — MIRABEGRON ER 25 MG PO TB24: 25.0000 mg | ORAL_TABLET | Freq: Every day | ORAL | 3 refills | Status: DC

## 2016-10-24 NOTE — Patient Instructions (Signed)
Schedule your complete physical in 6 months When we get the Myrbetriq approved, start that INSTEAD of the [place] Once you've started the Vesicare, let me know by phone or MyChart after 4-6 weeks how things are doing (there is a higher dose) Call with any questions or concerns Happy Holidays! Happy Early [place]!!!

## 2016-10-24 NOTE — Assessment & Plan Note (Signed)
2/2 DJD.  S/p cortisone injections, doing physical therapy and improving well.  Repeated intraarticular injection on right today.  Discussed tylenol, glucosamine, topical medications.  Heat/ice.  Will get approval for viscosupplementation.  After informed written consent, patient was seated on exam table. Right knee was prepped with alcohol swab and utilizing anteromedial approach, patient's right knee was injected intraarticularly with 3:1 bupivicaine: depomedrol. Patient tolerated the procedure well without immediate complications.

## 2016-10-24 NOTE — Assessment & Plan Note (Signed)
Pt had only 50% improvement on some days and on other days, no improvement.  + dry mouth which makes me hesitant to increase the dose.  Based on this, will switch to Myrbetriq.  Pt expressed understanding and is in agreement w/ plan.

## 2016-10-24 NOTE — Assessment & Plan Note (Signed)
encouraged to do home exercises and stretches.  Repeated trochanteric bursa injection left hip.  Icing as needed.  After informed written consent patient was lying on right side on exam table.  Area overlying left trochanteric bursa prepped with alcohol swab then injected with 6:2 bupivicaine: depomedrol.  Patient tolerated procedure well without immediate complications.

## 2016-10-24 NOTE — Progress Notes (Signed)
   Subjective:    Patient ID: Susan Pittman, female    DOB: 06/23/1941, 75 y.o.   MRN: [id number]  HPI Urinary incontinence- pt was started on Vesicare last visit.  She reports this has helped 'a little bit' but continues to have leakage issues.  Some days sxs are '50% better'.  Pt is having some dry mouth since starting medication.  No dry eye sxs.     Review of Systems For ROS see HPI     Objective:   Physical Exam  Constitutional: She is oriented to person, place, and time. She appears well-developed and well-nourished. No distress.  HENT:  Head: Normocephalic and atraumatic.  Neurological: She is alert and oriented to person, place, and time.  Skin: Skin is warm and dry.  Psychiatric: She has a normal mood and affect. Her behavior is normal. Thought content normal.  Vitals reviewed.         Assessment & Plan:

## 2016-10-24 NOTE — Progress Notes (Signed)
Pre visit review using our clinic review tool, if applicable. No additional management support is needed unless otherwise documented below in the visit note. 

## 2016-10-25 ENCOUNTER — Ambulatory Visit: Payer: Medicare Other | Admitting: Family Medicine

## 2016-11-15 ENCOUNTER — Ambulatory Visit (INDEPENDENT_AMBULATORY_CARE_PROVIDER_SITE_OTHER): Payer: Medicare Other | Admitting: Family Medicine

## 2016-11-15 ENCOUNTER — Encounter: Payer: Self-pay | Admitting: Family Medicine

## 2016-11-15 VITALS — Ht 65.0 in | Wt 230.0 lb

## 2016-11-15 DIAGNOSIS — M170 Bilateral primary osteoarthritis of knee: Secondary | ICD-10-CM | POA: Diagnosis present

## 2016-11-15 MED ORDER — SODIUM HYALURONATE (VISCOSUP) 25 MG/2.5ML IX SOSY
2.5000 mL | PREFILLED_SYRINGE | Freq: Once | INTRA_ARTICULAR | Status: AC
  Administered 2016-11-15: 2.5 mL via INTRA_ARTICULAR

## 2016-11-18 NOTE — Progress Notes (Signed)
PCP and consultation requested by: [name], MD  Subjective:   HPI: Patient is a 76 y.o. female here for bilateral knee pain, left hip pain.  3/8: Patient reports she's had off and on pain in left knee and left hip that worsened after coming off her prednisone (for IgA nephropathy). Was on this for two years and just off past two months. Pain in left knee is mainly posterior, 0/10 at rest, up to 10/10 and sharp at times. Worse with ambulation. Has a history of a bakers cyst here. Hip pain is lateral, worse and sharp when lying down on this side. Pain here 0/10 at rest, 7/10 at worst. No skin changes, fever, other complaints.  4/7: Patient reports she feels much better. No pain with walking in knee or hip. Pain level 0/10. Some soreness only to touch in these areas and if lying on the left side. No numbness, skin changes.  6/28: Patient reports both of her knees are hurting now. Pain level is 0/10 at rest, up to 8/10 and very stiff, achy with walking. Using a cane. Most pain anterior knees - radiates some posteriorly. She has pain in ankles as well anteriorly. No numbness, skin changes.  8/23: Patient reports she is doing well today. Had a bad day a couple PT sessions ago. Done about 10 visits so far with PT and made good progress. Doing water aerobics. Using a cane. Not taking any medications - concerned about her kidneys being affected by any medication - has not discussed with nephrologist yet. Pain level 0/10 today. Gets soreness of knees primarily with ambulation. No skin changes, numbness.  12/19: Patient reports her right knee has started bothering her - giving out, pain is 2/10 at rest, dull and anterior. Interested in cortisone shot for knee and getting approval for viscosupplementation. Pain on both hips when lying on sides though left side is worse. No radiation of pain. Not doing home exercises as much as she should. Not taking anything for  pain. No skin changes, numbness.  11/15/16: Patient returns to start viscosupplementation in both knees. Pain currently 0/10. No skin changes.  Past Medical History:  Diagnosis Date  . Allergy   . Arthritis    "knees, hips, back, hands" (11/08/2013)  . Asthma   . Basal cell carcinoma    "cut them off face & right shoulder" (11/08/2013)  . CAD (coronary artery disease), non obstructive by cardiac cath 12/12/13 01/24/2014  . CKD (chronic kidney disease), stage III   . Diastolic CHF ([place])   . DJD (degenerative joint disease)   . Gastric ulcer   . GERD (gastroesophageal reflux disease)   . Hiatal hernia   . History of steroid induced diabetes   . Hyperlipidemia    "don't take RX for it" (11/08/2013)  . Hypertension   . Hypothyroid   . Lung mass    superior segment LLL/notes 11/08/2013  . Lung nodule/ adenoca > LLobectomy 12/15/13  11/08/2013   PET 11/23/2013 1. Dominant sub solid left lower lobe nodule does not demonstrate significantly increased metabolic activity. However, morphologically, adenocarcinoma is still a concern.   - Spirometry 11/24/13 wnl  - 11/24/2013 T surg eval rec> LLower lobectomy 12/15/2013 for adenoca   . Nephropathy   . Osteoporosis   . Pneumonia    "I've had it ?3 times" (11/08/2013)  . Renal artery stenosis [facility])    with stent placement    Current Outpatient Prescriptions on File Prior to Visit  Medication Sig Dispense Refill  .  albuterol (PROVENTIL HFA;VENTOLIN HFA) 108 (90 BASE) MCG/ACT inhaler Inhale 1-2 puffs into the lungs every 6 (six) hours as needed for wheezing or shortness of breath. 18 g 1  . allopurinol (ZYLOPRIM) 100 MG tablet Take 1 tablet (100 mg total) by mouth 2 (two) times daily. Takes 2 tablets in am 180 tablet 0  . beclomethasone (QVAR) 80 MCG/ACT inhaler Inhale 2 puffs into the lungs 2 (two) times daily. 1 Inhaler 12  . BYSTOLIC 20 MG TABS     . Calcium Carb-Cholecalciferol 600-800 MG-UNIT TABS Take 2 tablets by mouth daily.     . cetirizine  (ZYRTEC) 10 MG tablet Take 10 mg by mouth at bedtime.     . fluticasone (FLONASE) 50 MCG/ACT nasal spray USE TWO SPRAY(S) IN EACH NOSTRIL ONCE DAILY 16 g 2  . furosemide (LASIX) 40 MG tablet Take 1 tablet (40 mg total) by mouth 2 (two) times daily. 180 tablet 2  . glucose blood (ONE TOUCH ULTRA TEST) test strip Dx E09.9. Please use one strip each time sugars are checked. Pt tests 2 times daily. 100 each 6  . hydrALAZINE (APRESOLINE) 50 MG tablet Take 1 tablet (50 mg total) by mouth 2 (two) times daily. 180 tablet 1  . Lancets (ONETOUCH ULTRASOFT) lancets Use 1 per day dx code 249.00 100 each 12  . lansoprazole (PREVACID) 15 MG capsule Take 15 mg by mouth daily at 12 noon.    Marland Kitchen levothyroxine (SYNTHROID, LEVOTHROID) 88 MCG tablet TAKE ONE TABLET BY MOUTH ONCE DAILY BEFORE  BREAKFAST 90 tablet 0  . mirabegron ER (MYRBETRIQ) 25 MG TB24 tablet Take 1 tablet (25 mg total) by mouth daily. 30 tablet 3  . ONETOUCH DELICA LANCETS 95M MISC Use to check blood sugar once daily as instructed. Dx code: E11.9 100 each 2  . sertraline (ZOLOFT) 50 MG tablet TAKE ONE & ONE-HALF TABLETS BY MOUTH ONCE DAILY 135 tablet 0  . solifenacin (VESICARE) 5 MG tablet Take 1 tablet (5 mg total) by mouth daily. 30 tablet 3   No current facility-administered medications on file prior to visit.     Past Surgical History:  Procedure Laterality Date  . ANTERIOR CERVICAL DECOMP/DISCECTOMY FUSION  2000   C5-C6  . CARDIAC CATHETERIZATION     Hx: of 2/ 2015  . CHOLECYSTECTOMY  1970's  . CORONARY ANGIOGRAM     Hx: of 2/ 2015  . DILATION AND CURETTAGE OF UTERUS  1960's  . KNEE ARTHROSCOPY Bilateral   . LEFT HEART CATHETERIZATION WITH CORONARY ANGIOGRAM N/A 12/13/2013   Procedure: LEFT HEART CATHETERIZATION WITH CORONARY ANGIOGRAM;  Surgeon: Burnell Blanks, MD;  Location: [facility] CATH LAB;  Service: Cardiovascular;  Laterality: N/A;  . LOBECTOMY Left 12/15/2013   Procedure: LOBECTOMY;  Surgeon: Grace Isaac, MD;  Location:  [place];  Service: Thoracic;  Laterality: Left;  . PERCUTANEOUS STENT INTERVENTION Right 11/08/2013   Procedure: PERCUTANEOUS STENT INTERVENTION;  Surgeon: Lorretta Harp, MD;  Location: [facility] CATH LAB;  Service: Cardiovascular;  Laterality: Right;  rt renal artery stent  . RENAL ANGIOGRAM Bilateral 11/08/2013   Procedure: RENAL ANGIOGRAM;  Surgeon: Lorretta Harp, MD;  Location: [facility] CATH LAB;  Service: Cardiovascular;  Laterality: Bilateral;  . RENAL ARTERY STENT Right 11/08/2013   Archie Endo 11/08/2013  . SKIN CANCER EXCISION    . TONSILLECTOMY AND ADENOIDECTOMY  ?1946  . VAGINAL HYSTERECTOMY  1970  . VIDEO ASSISTED THORACOSCOPY (VATS)/WEDGE RESECTION Left 12/15/2013   Procedure: VIDEO ASSISTED THORACOSCOPY (VATS)/WEDGE RESECTION;  Surgeon:  Grace Isaac, MD;  Location: [place];  Service: Thoracic;  Laterality: Left;  Marland Kitchen VIDEO BRONCHOSCOPY N/A 12/15/2013   Procedure: VIDEO BRONCHOSCOPY;  Surgeon: Grace Isaac, MD;  Location: [facility] OR;  Service: Thoracic;  Laterality: N/A;    Allergies  Allergen Reactions  . Benazepril Hcl Anaphylaxis and Cough  . Loratadine Anaphylaxis    anaphylaxis  . Fexofenadine Other (See Comments)    Severe back pain  . Allegra [Fexofenadine Hcl] Other (See Comments)    Severe back pain  . Celecoxib Other (See Comments)    REACTION: aching  . Lisinopril     REACTION: cough  . Sulfa Antibiotics Hives  . Sulfur Rash    Social History   Social History  . Marital status: Married    Spouse name: N/A  . Number of children: N/A  . Years of education: N/A   Occupational History  . Retired    Social History Main Topics  . Smoking status: Never Smoker  . Smokeless tobacco: Never Used  . Alcohol use No  . Drug use: No  . Sexual activity: Yes   Other Topics Concern  . Not on file   Social History Narrative  . No narrative on file    Family History  Problem Relation Age of Onset  . Breast cancer Sister   . Heart disease Mother   . Emphysema Father      smoked  . Heart disease Father   . Diabetes Father   . Stroke Father   . Allergies Sister   . Allergies Brother   . Diabetes Brother   . Heart failure Neg Hx     Ht '5\' 5"'$  (1.651 m)   Wt 230 lb (104.3 kg)   BMI 38.27 kg/m   Review of Systems: See HPI above.    Objective:  Physical Exam:  Gen: NAD, comfortable in exam room  Exam not repeated today. Bilateral knees: No gross deformity, ecchymoses, effusions. Mild TTP medial joint lines. FROM. Negative ant/post drawers. Negative valgus/varus testing. Negative lachmanns. Negative mcmurrays, apleys. NV intact distally.  Bilateral hips: No gross deformity, swelling, bruising. TTP L > R trochanteric bursae.  No other tenderness. Mild limitation ROM but no pain with logroll. Hip abduction 3/5 bilaterally, painful.    Assessment & Plan:  1. Bilateral knee pain - 2/2 DJD.  S/p cortisone injections, physical therapy and home exercises.  Starting viscosupplementation today.  Discussed tylenol, glucosamine, topical medications.  Heat/ice.  F/u in 1 week for second injection.  After informed written consent, patient was seated on exam table. Right knee was prepped with alcohol swab and utilizing anteromedial approach, patient's right knee was injected intraarticularly with 92m bupivicaine followed by supartz. Patient tolerated the procedure well without immediate complications.  After informed written consent, patient was seated on exam table. Left knee was prepped with alcohol swab and utilizing anteromedial approach, patient's left knee was injected intraarticularly with 362mbupivicaine followed by supartz. Patient tolerated the procedure well without immediate complications.

## 2016-11-18 NOTE — Assessment & Plan Note (Signed)
S/p cortisone injections, physical therapy and home exercises.  Starting viscosupplementation today.  Discussed tylenol, glucosamine, topical medications.  Heat/ice.  F/u in 1 week for second injection.  After informed written consent, patient was seated on exam table. Right knee was prepped with alcohol swab and utilizing anteromedial approach, patient's right knee was injected intraarticularly with 76m bupivicaine followed by supartz. Patient tolerated the procedure well without immediate complications.  After informed written consent, patient was seated on exam table. Left knee was prepped with alcohol swab and utilizing anteromedial approach, patient's left knee was injected intraarticularly with 332mbupivicaine followed by supartz. Patient tolerated the procedure well without immediate complications.

## 2016-11-22 ENCOUNTER — Encounter: Payer: Self-pay | Admitting: Family Medicine

## 2016-11-22 ENCOUNTER — Other Ambulatory Visit: Payer: Self-pay | Admitting: Family Medicine

## 2016-11-22 ENCOUNTER — Ambulatory Visit (INDEPENDENT_AMBULATORY_CARE_PROVIDER_SITE_OTHER): Payer: Medicare Other | Admitting: Family Medicine

## 2016-11-22 VITALS — BP 149/56 | Ht 65.0 in | Wt 230.0 lb

## 2016-11-22 DIAGNOSIS — M170 Bilateral primary osteoarthritis of knee: Secondary | ICD-10-CM

## 2016-11-22 MED ORDER — SODIUM HYALURONATE (VISCOSUP) 25 MG/2.5ML IX SOSY
2.5000 mL | PREFILLED_SYRINGE | Freq: Once | INTRA_ARTICULAR | Status: AC
  Administered 2016-11-22: 2.5 mL via INTRA_ARTICULAR

## 2016-11-22 MED ORDER — SODIUM HYALURONATE (VISCOSUP) 25 MG/2.5ML IX SOSY
2.5000 mL | PREFILLED_SYRINGE | Freq: Once | INTRA_ARTICULAR | Status: AC
Start: 2016-11-22 — End: 2016-11-22
  Administered 2016-11-22: 2.5 mL via INTRA_ARTICULAR

## 2016-11-25 NOTE — Assessment & Plan Note (Signed)
S/p cortisone injections, physical therapy and home exercises.  Second supartz injections given today.  Discussed tylenol, glucosamine, topical medications.  Heat/ice.  F/u in 1 week for third injection.  After informed written consent, patient was seated on exam table. Right knee was prepped with alcohol swab and utilizing anteromedial approach, patient's right knee was injected intraarticularly with 67m bupivicaine followed by supartz. Patient tolerated the procedure well without immediate complications.  After informed written consent, patient was seated on exam table. Left knee was prepped with alcohol swab and utilizing anteromedial approach, patient's left knee was injected intraarticularly with 35mbupivicaine followed by supartz. Patient tolerated the procedure well without immediate complications.

## 2016-11-25 NOTE — Progress Notes (Signed)
PCP and consultation requested by: [name], MD  Subjective:   HPI: Patient is a 76 y.o. female here for bilateral knee pain, left hip pain.  3/8: Patient reports she's had off and on pain in left knee and left hip that worsened after coming off her prednisone (for IgA nephropathy). Was on this for two years and just off past two months. Pain in left knee is mainly posterior, 0/10 at rest, up to 10/10 and sharp at times. Worse with ambulation. Has a history of a bakers cyst here. Hip pain is lateral, worse and sharp when lying down on this side. Pain here 0/10 at rest, 7/10 at worst. No skin changes, fever, other complaints.  4/7: Patient reports she feels much better. No pain with walking in knee or hip. Pain level 0/10. Some soreness only to touch in these areas and if lying on the left side. No numbness, skin changes.  6/28: Patient reports both of her knees are hurting now. Pain level is 0/10 at rest, up to 8/10 and very stiff, achy with walking. Using a cane. Most pain anterior knees - radiates some posteriorly. She has pain in ankles as well anteriorly. No numbness, skin changes.  8/23: Patient reports she is doing well today. Had a bad day a couple PT sessions ago. Done about 10 visits so far with PT and made good progress. Doing water aerobics. Using a cane. Not taking any medications - concerned about her kidneys being affected by any medication - has not discussed with nephrologist yet. Pain level 0/10 today. Gets soreness of knees primarily with ambulation. No skin changes, numbness.  12/19: Patient reports her right knee has started bothering her - giving out, pain is 2/10 at rest, dull and anterior. Interested in cortisone shot for knee and getting approval for viscosupplementation. Pain on both hips when lying on sides though left side is worse. No radiation of pain. Not doing home exercises as much as she should. Not taking anything for  pain. No skin changes, numbness.  11/15/16: Patient returns to start viscosupplementation in both knees. Pain currently 0/10. No skin changes.  11/22/16: Patient returns for supartz. She's doing better following injections. No skin changes, other complaints.  Past Medical History:  Diagnosis Date  . Allergy   . Arthritis    "knees, hips, back, hands" (11/08/2013)  . Asthma   . Basal cell carcinoma    "cut them off face & right shoulder" (11/08/2013)  . CAD (coronary artery disease), non obstructive by cardiac cath 12/12/13 01/24/2014  . CKD (chronic kidney disease), stage III   . Diastolic CHF ([place])   . DJD (degenerative joint disease)   . Gastric ulcer   . GERD (gastroesophageal reflux disease)   . Hiatal hernia   . History of steroid induced diabetes   . Hyperlipidemia    "don't take RX for it" (11/08/2013)  . Hypertension   . Hypothyroid   . Lung mass    superior segment LLL/notes 11/08/2013  . Lung nodule/ adenoca > LLobectomy 12/15/13  11/08/2013   PET 11/23/2013 1. Dominant sub solid left lower lobe nodule does not demonstrate significantly increased metabolic activity. However, morphologically, adenocarcinoma is still a concern.   - Spirometry 11/24/13 wnl  - 11/24/2013 T surg eval rec> LLower lobectomy 12/15/2013 for adenoca   . Nephropathy   . Osteoporosis   . Pneumonia    "I've had it ?3 times" (11/08/2013)  . Renal artery stenosis ([facility])    with stent placement  Current Outpatient Prescriptions on File Prior to Visit  Medication Sig Dispense Refill  . albuterol (PROVENTIL HFA;VENTOLIN HFA) 108 (90 BASE) MCG/ACT inhaler Inhale 1-2 puffs into the lungs every 6 (six) hours as needed for wheezing or shortness of breath. 18 g 1  . allopurinol (ZYLOPRIM) 100 MG tablet Take 1 tablet (100 mg total) by mouth 2 (two) times daily. Takes 2 tablets in am 180 tablet 0  . beclomethasone (QVAR) 80 MCG/ACT inhaler Inhale 2 puffs into the lungs 2 (two) times daily. 1 Inhaler 12  . BYSTOLIC 20  MG TABS     . Calcium Carb-Cholecalciferol 600-800 MG-UNIT TABS Take 2 tablets by mouth daily.     . cetirizine (ZYRTEC) 10 MG tablet Take 10 mg by mouth at bedtime.     . fluticasone (FLONASE) 50 MCG/ACT nasal spray USE TWO SPRAY(S) IN EACH NOSTRIL ONCE DAILY 16 g 2  . glucose blood (ONE TOUCH ULTRA TEST) test strip Dx E09.9. Please use one strip each time sugars are checked. Pt tests 2 times daily. 100 each 6  . hydrALAZINE (APRESOLINE) 50 MG tablet Take 1 tablet (50 mg total) by mouth 2 (two) times daily. 180 tablet 1  . Lancets (ONETOUCH ULTRASOFT) lancets Use 1 per day dx code 249.00 100 each 12  . lansoprazole (PREVACID) 15 MG capsule Take 15 mg by mouth daily at 12 noon.    Marland Kitchen levothyroxine (SYNTHROID, LEVOTHROID) 88 MCG tablet TAKE ONE TABLET BY MOUTH ONCE DAILY BEFORE  BREAKFAST 90 tablet 0  . mirabegron ER (MYRBETRIQ) 25 MG TB24 tablet Take 1 tablet (25 mg total) by mouth daily. 30 tablet 3  . ONETOUCH DELICA LANCETS 54Y MISC Use to check blood sugar once daily as instructed. Dx code: E11.9 100 each 2  . sertraline (ZOLOFT) 50 MG tablet TAKE ONE & ONE-HALF TABLETS BY MOUTH ONCE DAILY 135 tablet 0  . solifenacin (VESICARE) 5 MG tablet Take 1 tablet (5 mg total) by mouth daily. 30 tablet 3   No current facility-administered medications on file prior to visit.     Past Surgical History:  Procedure Laterality Date  . ANTERIOR CERVICAL DECOMP/DISCECTOMY FUSION  2000   C5-C6  . CARDIAC CATHETERIZATION     Hx: of 2/ 2015  . CHOLECYSTECTOMY  1970's  . CORONARY ANGIOGRAM     Hx: of 2/ 2015  . DILATION AND CURETTAGE OF UTERUS  1960's  . KNEE ARTHROSCOPY Bilateral   . LEFT HEART CATHETERIZATION WITH CORONARY ANGIOGRAM N/A 12/13/2013   Procedure: LEFT HEART CATHETERIZATION WITH CORONARY ANGIOGRAM;  Surgeon: Burnell Blanks, MD;  Location: [facility] CATH LAB;  Service: Cardiovascular;  Laterality: N/A;  . LOBECTOMY Left 12/15/2013   Procedure: LOBECTOMY;  Surgeon: Grace Isaac, MD;   Location: [place];  Service: Thoracic;  Laterality: Left;  . PERCUTANEOUS STENT INTERVENTION Right 11/08/2013   Procedure: PERCUTANEOUS STENT INTERVENTION;  Surgeon: Lorretta Harp, MD;  Location: [facility] CATH LAB;  Service: Cardiovascular;  Laterality: Right;  rt renal artery stent  . RENAL ANGIOGRAM Bilateral 11/08/2013   Procedure: RENAL ANGIOGRAM;  Surgeon: Lorretta Harp, MD;  Location: [facility] CATH LAB;  Service: Cardiovascular;  Laterality: Bilateral;  . RENAL ARTERY STENT Right 11/08/2013   Archie Endo 11/08/2013  . SKIN CANCER EXCISION    . TONSILLECTOMY AND ADENOIDECTOMY  ?1946  . VAGINAL HYSTERECTOMY  1970  . VIDEO ASSISTED THORACOSCOPY (VATS)/WEDGE RESECTION Left 12/15/2013   Procedure: VIDEO ASSISTED THORACOSCOPY (VATS)/WEDGE RESECTION;  Surgeon: Grace Isaac, MD;  Location: [facility]  OR;  Service: Thoracic;  Laterality: Left;  Marland Kitchen VIDEO BRONCHOSCOPY N/A 12/15/2013   Procedure: VIDEO BRONCHOSCOPY;  Surgeon: Grace Isaac, MD;  Location: [facility] OR;  Service: Thoracic;  Laterality: N/A;    Allergies  Allergen Reactions  . Benazepril Hcl Anaphylaxis and Cough  . Loratadine Anaphylaxis    anaphylaxis  . Fexofenadine Other (See Comments)    Severe back pain  . Allegra [Fexofenadine Hcl] Other (See Comments)    Severe back pain  . Celecoxib Other (See Comments)    REACTION: aching  . Lisinopril     REACTION: cough  . Sulfa Antibiotics Hives  . Sulfur Rash    Social History   Social History  . Marital status: Married    Spouse name: N/A  . Number of children: N/A  . Years of education: N/A   Occupational History  . Retired    Social History Main Topics  . Smoking status: Never Smoker  . Smokeless tobacco: Never Used  . Alcohol use No  . Drug use: No  . Sexual activity: Yes   Other Topics Concern  . Not on file   Social History Narrative  . No narrative on file    Family History  Problem Relation Age of Onset  . Breast cancer Sister   . Heart disease Mother   . Emphysema  Father     smoked  . Heart disease Father   . Diabetes Father   . Stroke Father   . Allergies Sister   . Allergies Brother   . Diabetes Brother   . Heart failure Neg Hx     BP (!) 149/56   Ht '5\' 5"'$  (1.651 m)   Wt 230 lb (104.3 kg)   BMI 38.27 kg/m   Review of Systems: See HPI above.    Objective:  Physical Exam:  Gen: NAD, comfortable in exam room  Exam not repeated today. Bilateral knees: No gross deformity, ecchymoses, effusions. Mild TTP medial joint lines. FROM. Negative ant/post drawers. Negative valgus/varus testing. Negative lachmanns. Negative mcmurrays, apleys. NV intact distally.  Bilateral hips: No gross deformity, swelling, bruising. TTP L > R trochanteric bursae.  No other tenderness. Mild limitation ROM but no pain with logroll. Hip abduction 3/5 bilaterally, painful.    Assessment & Plan:  1. Bilateral knee pain - 2/2 DJD.  S/p cortisone injections, physical therapy and home exercises.  Second supartz injections given today.  Discussed tylenol, glucosamine, topical medications.  Heat/ice.  F/u in 1 week for third injection.  After informed written consent, patient was seated on exam table. Right knee was prepped with alcohol swab and utilizing anteromedial approach, patient's right knee was injected intraarticularly with 75m bupivicaine followed by supartz. Patient tolerated the procedure well without immediate complications.  After informed written consent, patient was seated on exam table. Left knee was prepped with alcohol swab and utilizing anteromedial approach, patient's left knee was injected intraarticularly with 331mbupivicaine followed by supartz. Patient tolerated the procedure well without immediate complications.

## 2016-11-29 ENCOUNTER — Ambulatory Visit (INDEPENDENT_AMBULATORY_CARE_PROVIDER_SITE_OTHER): Payer: Medicare Other | Admitting: Family Medicine

## 2016-11-29 ENCOUNTER — Encounter: Payer: Self-pay | Admitting: Family Medicine

## 2016-11-29 VITALS — BP 143/92 | HR 56 | Ht 65.0 in | Wt 230.0 lb

## 2016-11-29 DIAGNOSIS — M170 Bilateral primary osteoarthritis of knee: Secondary | ICD-10-CM | POA: Diagnosis present

## 2016-11-29 MED ORDER — SODIUM HYALURONATE (VISCOSUP) 25 MG/2.5ML IX SOSY
2.5600 mL | PREFILLED_SYRINGE | Freq: Once | INTRA_ARTICULAR | Status: AC
  Administered 2016-11-29: 2.56 mL via INTRA_ARTICULAR

## 2016-11-29 MED ORDER — SODIUM HYALURONATE (VISCOSUP) 25 MG/2.5ML IX SOSY
2.5000 mL | PREFILLED_SYRINGE | Freq: Once | INTRA_ARTICULAR | Status: AC
  Administered 2016-11-29: 2.5 mL via INTRA_ARTICULAR

## 2016-11-30 NOTE — Assessment & Plan Note (Signed)
S/p cortisone injections, physical therapy and home exercises.  Third supartz injections given today.  Discussed tylenol, glucosamine, topical medications.  Heat/ice.  Call us in 4 weeks to let us know how she's doing..  After informed written consent, patient was seated on exam table. Right knee was prepped with alcohol swab and utilizing anteromedial approach, patient's right knee was injected intraarticularly with 19m bupivicaine followed by supartz. Patient tolerated the procedure well without immediate complications.  After informed written consent, patient was seated on exam table. Left knee was prepped with alcohol swab and utilizing anteromedial approach, patient's left knee was injected intraarticularly with 334mbupivicaine followed by supartz. Patient tolerated the procedure well without immediate complications.

## 2016-11-30 NOTE — Progress Notes (Signed)
PCP and consultation requested by: [name], MD  Subjective:   HPI: Patient is a 76 y.o. female here for bilateral knee pain, left hip pain.  3/8: Patient reports she's had off and on pain in left knee and left hip that worsened after coming off her prednisone (for IgA nephropathy). Was on this for two years and just off past two months. Pain in left knee is mainly posterior, 0/10 at rest, up to 10/10 and sharp at times. Worse with ambulation. Has a history of a bakers cyst here. Hip pain is lateral, worse and sharp when lying down on this side. Pain here 0/10 at rest, 7/10 at worst. No skin changes, fever, other complaints.  4/7: Patient reports she feels much better. No pain with walking in knee or hip. Pain level 0/10. Some soreness only to touch in these areas and if lying on the left side. No numbness, skin changes.  6/28: Patient reports both of her knees are hurting now. Pain level is 0/10 at rest, up to 8/10 and very stiff, achy with walking. Using a cane. Most pain anterior knees - radiates some posteriorly. She has pain in ankles as well anteriorly. No numbness, skin changes.  8/23: Patient reports she is doing well today. Had a bad day a couple PT sessions ago. Done about 10 visits so far with PT and made good progress. Doing water aerobics. Using a cane. Not taking any medications - concerned about her kidneys being affected by any medication - has not discussed with nephrologist yet. Pain level 0/10 today. Gets soreness of knees primarily with ambulation. No skin changes, numbness.  12/19: Patient reports her right knee has started bothering her - giving out, pain is 2/10 at rest, dull and anterior. Interested in cortisone shot for knee and getting approval for viscosupplementation. Pain on both hips when lying on sides though left side is worse. No radiation of pain. Not doing home exercises as much as she should. Not taking anything for  pain. No skin changes, numbness.  11/15/16: Patient returns to start viscosupplementation in both knees. Pain currently 0/10. No skin changes.  11/22/16: Patient returns for supartz. She's doing better following injections. No skin changes, other complaints.  11/29/16: Patient reports she is doing very well. No pain currently. No skin changes, numbness.  Past Medical History:  Diagnosis Date  . Allergy   . Arthritis    "knees, hips, back, hands" (11/08/2013)  . Asthma   . Basal cell carcinoma    "cut them off face & right shoulder" (11/08/2013)  . CAD (coronary artery disease), non obstructive by cardiac cath 12/12/13 01/24/2014  . CKD (chronic kidney disease), stage III   . Diastolic CHF ([place])   . DJD (degenerative joint disease)   . Gastric ulcer   . GERD (gastroesophageal reflux disease)   . Hiatal hernia   . History of steroid induced diabetes   . Hyperlipidemia    "don't take RX for it" (11/08/2013)  . Hypertension   . Hypothyroid   . Lung mass    superior segment LLL/notes 11/08/2013  . Lung nodule/ adenoca > LLobectomy 12/15/13  11/08/2013   PET 11/23/2013 1. Dominant sub solid left lower lobe nodule does not demonstrate significantly increased metabolic activity. However, morphologically, adenocarcinoma is still a concern.   - Spirometry 11/24/13 wnl  - 11/24/2013 T surg eval rec> LLower lobectomy 12/15/2013 for adenoca   . Nephropathy   . Osteoporosis   . Pneumonia    "I've had  it ?3 times" (11/08/2013)  . Renal artery stenosis [facility])    with stent placement    Current Outpatient Prescriptions on File Prior to Visit  Medication Sig Dispense Refill  . albuterol (PROVENTIL HFA;VENTOLIN HFA) 108 (90 BASE) MCG/ACT inhaler Inhale 1-2 puffs into the lungs every 6 (six) hours as needed for wheezing or shortness of breath. 18 g 1  . allopurinol (ZYLOPRIM) 100 MG tablet Take 1 tablet (100 mg total) by mouth 2 (two) times daily. Takes 2 tablets in am 180 tablet 0  . beclomethasone (QVAR)  80 MCG/ACT inhaler Inhale 2 puffs into the lungs 2 (two) times daily. 1 Inhaler 12  . BYSTOLIC 20 MG TABS     . Calcium Carb-Cholecalciferol 600-800 MG-UNIT TABS Take 2 tablets by mouth daily.     . cetirizine (ZYRTEC) 10 MG tablet Take 10 mg by mouth at bedtime.     . fluticasone (FLONASE) 50 MCG/ACT nasal spray USE TWO SPRAY(S) IN EACH NOSTRIL ONCE DAILY 16 g 2  . furosemide (LASIX) 40 MG tablet TAKE 1 TABLET BY MOUTH EVERY EVENING UNTIL WEIGHT OF 219 IS REACHED 90 tablet 0  . glucose blood (ONE TOUCH ULTRA TEST) test strip Dx E09.9. Please use one strip each time sugars are checked. Pt tests 2 times daily. 100 each 6  . hydrALAZINE (APRESOLINE) 50 MG tablet Take 1 tablet (50 mg total) by mouth 2 (two) times daily. 180 tablet 1  . Lancets (ONETOUCH ULTRASOFT) lancets Use 1 per day dx code 249.00 100 each 12  . lansoprazole (PREVACID) 15 MG capsule Take 15 mg by mouth daily at 12 noon.    Marland Kitchen levothyroxine (SYNTHROID, LEVOTHROID) 88 MCG tablet TAKE ONE TABLET BY MOUTH ONCE DAILY BEFORE  BREAKFAST 90 tablet 0  . mirabegron ER (MYRBETRIQ) 25 MG TB24 tablet Take 1 tablet (25 mg total) by mouth daily. 30 tablet 3  . ONETOUCH DELICA LANCETS 34V MISC Use to check blood sugar once daily as instructed. Dx code: E11.9 100 each 2  . sertraline (ZOLOFT) 50 MG tablet TAKE ONE & ONE-HALF TABLETS BY MOUTH ONCE DAILY 135 tablet 0  . solifenacin (VESICARE) 5 MG tablet Take 1 tablet (5 mg total) by mouth daily. 30 tablet 3   No current facility-administered medications on file prior to visit.     Past Surgical History:  Procedure Laterality Date  . ANTERIOR CERVICAL DECOMP/DISCECTOMY FUSION  2000   C5-C6  . CARDIAC CATHETERIZATION     Hx: of 2/ 2015  . CHOLECYSTECTOMY  1970's  . CORONARY ANGIOGRAM     Hx: of 2/ 2015  . DILATION AND CURETTAGE OF UTERUS  1960's  . KNEE ARTHROSCOPY Bilateral   . LEFT HEART CATHETERIZATION WITH CORONARY ANGIOGRAM N/A 12/13/2013   Procedure: LEFT HEART CATHETERIZATION WITH  CORONARY ANGIOGRAM;  Surgeon: Burnell Blanks, MD;  Location[facility] CATH LAB;  Service: Cardiovascular;  Laterality: N/A;  . LOBECTOMY Left 12/15/2013   Procedure: LOBECTOMY;  Surgeon: Grace Isaac, MD;  Location: [place];  Service: Thoracic;  Laterality: Left;  . PERCUTANEOUS STENT INTERVENTION Right 11/08/2013   Procedure: PERCUTANEOUS STENT INTERVENTION;  Surgeon: Lorretta Harp, MD;  Location: [facility] CATH LAB;  Service: Cardiovascular;  Laterality: Right;  rt renal artery stent  . RENAL ANGIOGRAM Bilateral 11/08/2013   Procedure: RENAL ANGIOGRAM;  Surgeon: Lorretta Harp, MD;  Location: [facility] CATH LAB;  Service: Cardiovascular;  Laterality: Bilateral;  . RENAL ARTERY STENT Right 11/08/2013   Archie Endo 11/08/2013  . SKIN CANCER  EXCISION    . TONSILLECTOMY AND ADENOIDECTOMY  ?1946  . VAGINAL HYSTERECTOMY  1970  . VIDEO ASSISTED THORACOSCOPY (VATS)/WEDGE RESECTION Left 12/15/2013   Procedure: VIDEO ASSISTED THORACOSCOPY (VATS)/WEDGE RESECTION;  Surgeon: Grace Isaac, MD;  Location: [place];  Service: Thoracic;  Laterality: Left;  Marland Kitchen VIDEO BRONCHOSCOPY N/A 12/15/2013   Procedure: VIDEO BRONCHOSCOPY;  Surgeon: Grace Isaac, MD;  Location: [facility] OR;  Service: Thoracic;  Laterality: N/A;    Allergies  Allergen Reactions  . Benazepril Hcl Anaphylaxis and Cough  . Loratadine Anaphylaxis    anaphylaxis  . Fexofenadine Other (See Comments)    Severe back pain  . Allegra [Fexofenadine Hcl] Other (See Comments)    Severe back pain  . Celecoxib Other (See Comments)    REACTION: aching  . Lisinopril     REACTION: cough  . Sulfa Antibiotics Hives  . Sulfur Rash    Social History   Social History  . Marital status: Married    Spouse name: N/A  . Number of children: N/A  . Years of education: N/A   Occupational History  . Retired    Social History Main Topics  . Smoking status: Never Smoker  . Smokeless tobacco: Never Used  . Alcohol use No  . Drug use: No  . Sexual activity: Yes    Other Topics Concern  . Not on file   Social History Narrative  . No narrative on file    Family History  Problem Relation Age of Onset  . Breast cancer Sister   . Heart disease Mother   . Emphysema Father     smoked  . Heart disease Father   . Diabetes Father   . Stroke Father   . Allergies Sister   . Allergies Brother   . Diabetes Brother   . Heart failure Neg Hx     BP (!) 143/92   Pulse (!) 56   Ht '5\' 5"'$  (1.651 m)   Wt 230 lb (104.3 kg)   BMI 38.27 kg/m   Review of Systems: See HPI above.    Objective:  Physical Exam:  Gen: NAD, comfortable in exam room  Exam not repeated today. Bilateral knees: No gross deformity, ecchymoses, effusions. Mild TTP medial joint lines. FROM. Negative ant/post drawers. Negative valgus/varus testing. Negative lachmanns. Negative mcmurrays, apleys. NV intact distally.  Bilateral hips: No gross deformity, swelling, bruising. TTP L > R trochanteric bursae.  No other tenderness. Mild limitation ROM but no pain with logroll. Hip abduction 3/5 bilaterally, painful.    Assessment & Plan:  1. Bilateral knee pain - 2/2 DJD.  S/p cortisone injections, physical therapy and home exercises.  Third supartz injections given today.  Discussed tylenol, glucosamine, topical medications.  Heat/ice.  Call us in 4 weeks to let us know how she's doing..  After informed written consent, patient was seated on exam table. Right knee was prepped with alcohol swab and utilizing anteromedial approach, patient's right knee was injected intraarticularly with 66m bupivicaine followed by supartz. Patient tolerated the procedure well without immediate complications.  After informed written consent, patient was seated on exam table. Left knee was prepped with alcohol swab and utilizing anteromedial approach, patient's left knee was injected intraarticularly with 334mbupivicaine followed by supartz. Patient tolerated the procedure well without immediate  complications.

## 2016-12-25 ENCOUNTER — Ambulatory Visit (INDEPENDENT_AMBULATORY_CARE_PROVIDER_SITE_OTHER): Payer: Medicare Other | Admitting: Family Medicine

## 2016-12-25 ENCOUNTER — Encounter: Payer: Self-pay | Admitting: Family Medicine

## 2016-12-25 ENCOUNTER — Ambulatory Visit (HOSPITAL_BASED_OUTPATIENT_CLINIC_OR_DEPARTMENT_OTHER)
Admission: RE | Admit: 2016-12-25 | Discharge: 2016-12-25 | Disposition: A | Discharge status: Home / Self Care | Payer: Medicare Other | Source: Ambulatory Visit | Attending: Family Medicine | Admitting: Family Medicine

## 2016-12-25 VITALS — BP 124/86 | HR 68 | Temp 98.1°F | Resp 16 | Ht 65.0 in | Wt 239.4 lb

## 2016-12-25 DIAGNOSIS — I5032 Chronic diastolic (congestive) heart failure: Secondary | ICD-10-CM

## 2016-12-25 DIAGNOSIS — E038 Other specified hypothyroidism: Secondary | ICD-10-CM | POA: Diagnosis not present

## 2016-12-25 DIAGNOSIS — R079 Chest pain, unspecified: Secondary | ICD-10-CM | POA: Diagnosis not present

## 2016-12-25 DIAGNOSIS — R05 Cough: Secondary | ICD-10-CM

## 2016-12-25 DIAGNOSIS — R059 Cough, unspecified: Secondary | ICD-10-CM

## 2016-12-25 LAB — CBC WITH DIFFERENTIAL/PLATELET
BASOS ABS: 0.1 10*3/uL (ref 0.0–0.1)
BASOS PCT: 0.8 % (ref 0.0–3.0)
EOS ABS: 0.2 10*3/uL (ref 0.0–0.7)
Eosinophils Relative: 1.4 % (ref 0.0–5.0)
HCT: 37 % (ref 36.0–46.0)
HEMOGLOBIN: 11.9 g/dL — AB (ref 12.0–15.0)
LYMPHS PCT: 15 % (ref 12.0–46.0)
Lymphs Abs: 1.6 10*3/uL (ref 0.7–4.0)
MCHC: 32 g/dL (ref 30.0–36.0)
MCV: 82.8 fl (ref 78.0–100.0)
MONO ABS: 0.6 10*3/uL (ref 0.1–1.0)
Monocytes Relative: 5.9 % (ref 3.0–12.0)
Neutro Abs: 8.1 10*3/uL — ABNORMAL HIGH (ref 1.4–7.7)
Neutrophils Relative %: 76.9 % (ref 43.0–77.0)
Platelets: 314 10*3/uL (ref 150.0–400.0)
RBC: 4.47 Mil/uL (ref 3.87–5.11)
RDW: 18.3 % — AB (ref 11.5–15.5)
WBC: 10.5 10*3/uL (ref 4.0–10.5)

## 2016-12-25 LAB — BRAIN NATRIURETIC PEPTIDE: PRO B NATRI PEPTIDE: 224 pg/mL — AB (ref 0.0–100.0)

## 2016-12-25 LAB — TSH: TSH: 1.23 u[IU]/mL (ref 0.35–4.50)

## 2016-12-25 NOTE — Progress Notes (Signed)
   Subjective:    Patient ID: Susan Pittman, female    DOB: 11/10/1940, 76 y.o.   MRN: [id number]  HPI Cough- pt reports dry cough, intermittent ear pain, tightness in back.  Started asthma inhaler 1 week ago and 'that seemed to help a little bit'.  Pt is fearful that cancer has returned.  Cough started ~2 weeks ago.  Pt denies swelling despite gaining 10 lbs in 3 weeks.  + SOB- worse than usual.  Intermittent wheezing.  No sick contacts.   Review of Systems For ROS see HPI     Objective:   Physical Exam  Constitutional: She is oriented to person, place, and time. She appears well-developed and well-nourished. No distress.  HENT:  Head: Normocephalic and atraumatic.  Right Ear: Tympanic membrane normal.  Left Ear: Tympanic membrane normal.  Nose: Mucosal edema and rhinorrhea present. Right sinus exhibits no maxillary sinus tenderness and no frontal sinus tenderness. Left sinus exhibits no maxillary sinus tenderness and no frontal sinus tenderness.  Mouth/Throat: Mucous membranes are normal. Posterior oropharyngeal erythema (w/ PND) present.  Eyes: Conjunctivae and EOM are normal. Pupils are equal, round, and reactive to light.  Neck: Normal range of motion. Neck supple.  Cardiovascular: Normal rate, regular rhythm and normal heart sounds.   Pulmonary/Chest: Effort normal and breath sounds normal. No respiratory distress. She has no wheezes. She has no rales.  Musculoskeletal: She exhibits no edema.  Lymphadenopathy:    She has no cervical adenopathy.  Neurological: She is alert and oriented to person, place, and time.  Skin: Skin is warm and dry.  Psychiatric: She has a normal mood and affect. Her behavior is normal. Thought content normal.  Vitals reviewed.         Assessment & Plan:  Cough- new.  Pt's PE is most consistent w/ allergic rhinitis but given her hx of diastolic heart failure and lung cancer, will get CXR to assess.  Restart Flonase daily.  Continue daily  antihistamine.  No cough heard during visit, no wheezing or crackles.  Check labs to r/o anemia, thyroid, heart failure as causes of SOB.  Reviewed supportive care and red flags that should prompt return.  Pt expressed understanding and is in agreement w/ plan.

## 2016-12-25 NOTE — Progress Notes (Signed)
Pre visit review using our clinic review tool, if applicable. No additional management support is needed unless otherwise documented below in the visit note. 

## 2016-12-25 NOTE — Assessment & Plan Note (Signed)
Check BNP and CXR

## 2016-12-25 NOTE — Assessment & Plan Note (Signed)
Check TSH to assess for underlying cause for SOB

## 2016-12-25 NOTE — Patient Instructions (Signed)
Follow up as needed/scheduled We'll notify you of your lab results and make any changes if needed Go to the [facility] and get your chest xray Continue the Cetirizine daily for the allergies Add the Flonase daily Drink plenty of fluids Mucinex DM for cough/congestion Call with any questions or concerns Hang in there!!!

## 2016-12-30 ENCOUNTER — Other Ambulatory Visit: Payer: Self-pay | Admitting: General Practice

## 2016-12-30 MED ORDER — LEVOTHYROXINE SODIUM 88 MCG PO TABS: ORAL_TABLET | ORAL | 0 refills | Status: DC

## 2016-12-30 MED ORDER — ALLOPURINOL 100 MG PO TABS: 100.0000 mg | ORAL_TABLET | Freq: Two times a day (BID) | ORAL | 0 refills | Status: DC

## 2016-12-30 MED ORDER — SERTRALINE HCL 50 MG PO TABS: ORAL_TABLET | ORAL | 0 refills | Status: DC

## 2017-01-09 ENCOUNTER — Telehealth: Payer: Self-pay | Admitting: Cardiovascular Disease

## 2017-01-09 NOTE — Telephone Encounter (Signed)
New message    Pt c/o medication issue:  1. Name of Medication: furosemide (LASIX) 40 MG tablet  2. How are you currently taking this medication (dosage and times per day)? 40 MG 2 time day  3. Are you having a reaction (difficulty breathing--STAT)? YES difficulty breathing and really short of breath sometimes  4. What is your medication issue? difficulty breathing and really short of breath sometimes

## 2017-01-09 NOTE — Telephone Encounter (Signed)
Spoke to patient.  Reporting progressive weight gain, shortness of breath.  Patient of Dr. Fletcher Anon, was last seen in office in June.  At that time, she was taking '80mg'$  of lasix in AM and '40mg'$  in PM.  December 5th 2017, labwork reviewed by Dr. Fletcher Anon -- med changes favored due to reduced kidney function. She was reduced to '40mg'$  lasix BID. Bystolic also reduced.  Patient notes over last month or so, progressive gain of weight and shortness of breath. She's reporting an approx 10 lb gain over 1 month. Pt denies changes to diet, fluid or salt intake. Shortness of breath is not activity limiting, but bothersome. She also has had occasional lightheadedness when standing.  I recommended 2-3 days extra lasix for total of '80mg'$  am, '40mg'$  pm, then to continue '40mg'$  BID. Also recommended PA appt.  Patient states she would like to see how she responds to lasix increase, but agreed to call us on Monday to give update & would be agreeable to seeing PA. Advised that we may need to do repeat labwork, would seek Dr. Tyrell Antonio advice for further instructions. Pt voiced understanding and thanks.

## 2017-01-11 NOTE — Telephone Encounter (Signed)
She should be seen in the office either by [place] or primary care physician.

## 2017-01-13 ENCOUNTER — Ambulatory Visit: Payer: Medicare Other | Admitting: Physician Assistant

## 2017-01-13 NOTE — Telephone Encounter (Signed)
Returned call to patient. She states she does have an appt today - was not aware it was with a PA or was at [employer]. She states she does not want to come to [facility] office for an appt, she does not like this office. Informed her that Dr. Fletcher Anon no longer sees patients at [facility], only [facility] on Tuesday AM and [place] the rest of the time. Advised it would be prudent for her to come in for her appt today for her acute symptoms and should she wish to change office locations in the future, this can be arranged. She voiced understanding.

## 2017-01-13 NOTE — Telephone Encounter (Signed)
Follow Up Call:  Susan Pittman called and stated she did what the nurse told her to a, but she is still having the shortness of breath and she is dizzy .

## 2017-01-13 NOTE — Progress Notes (Deleted)
Cardiology Office Note    Date:  01/13/2017   ID:  Susan Pittman, DOB 1941/04/22, MRN [id number]  PCP:  [name], MD  Cardiologist:  Dr. Fletcher Anon   No chief complaint on file.   History of Present Illness:  Susan Pittman is a 76 y.o. female with PMH of CAD, stage III CKD, chronic distolic heart failure, HTN, HLD, pulmonary adenocarcinoma s/p L lobectomy 12/15/2013, and renal artery stenosis s/p stenting. As part of her workup for hypertension, she was found to have significant renal artery stenosis. In 11/08/2013, she underwent stenting of her right renal artery by Dr. Alvester Chou after a renal Doppler suggested hemodynamically significant right renal artery stenosis. She subsequently presented to the hospital with chest pain on 12/12/2013 and underwent a cardiac catheterization and found to have mild nonobstructive CAD with smooth 20% RCA narrowing. EF was 65%. She underwent VATS and mini thoracotomy with lobectomy of the left lower lobe with lymph node dissection for adenocarcinoma on 12/15/2013. During of IgA nephropathy which was treated with prednisone. Her last renal Doppler obtained on 04/09/2016 showed patent stent in R renal artery. Her diuretic was cut back due to worsening renal function in December 2017.  She was recently seen by her PCP on 12/25/2016 for shortness of breath. She was diagnosed with allergic rhinitis. This x-ray showed no sign of pulmonary edema or other acute process. Her pro BNP was 224. TSH was normal. Hemoglobin was 11.9.  Yes EKG   Past Medical History:  Diagnosis Date  . Allergy   . Arthritis    "knees, hips, back, hands" (11/08/2013)  . Asthma   . Basal cell carcinoma    "cut them off face & right shoulder" (11/08/2013)  . CAD (coronary artery disease), non obstructive by cardiac cath 12/12/13 01/24/2014  . CKD (chronic kidney disease), stage III   . Diastolic CHF ([place])   . DJD (degenerative joint disease)   . Gastric ulcer   . GERD (gastroesophageal reflux  disease)   . Hiatal hernia   . History of steroid induced diabetes   . Hyperlipidemia    "don't take RX for it" (11/08/2013)  . Hypertension   . Hypothyroid   . Lung mass    superior segment LLL/notes 11/08/2013  . Lung nodule/ adenoca > LLobectomy 12/15/13  11/08/2013   PET 11/23/2013 1. Dominant sub solid left lower lobe nodule does not demonstrate significantly increased metabolic activity. However, morphologically, adenocarcinoma is still a concern.   - Spirometry 11/24/13 wnl  - 11/24/2013 T surg eval rec> LLower lobectomy 12/15/2013 for adenoca   . Nephropathy   . Osteoporosis   . Pneumonia    "I've had it ?3 times" (11/08/2013)  . Renal artery stenosis ([place])    with stent placement    Past Surgical History:  Procedure Laterality Date  . ANTERIOR CERVICAL DECOMP/DISCECTOMY FUSION  2000   C5-C6  . CARDIAC CATHETERIZATION     Hx: of 2/ 2015  . CHOLECYSTECTOMY  1970's  . CORONARY ANGIOGRAM     Hx: of 2/ 2015  . DILATION AND CURETTAGE OF UTERUS  1960's  . KNEE ARTHROSCOPY Bilateral   . LEFT HEART CATHETERIZATION WITH CORONARY ANGIOGRAM N/A 12/13/2013   Procedure: LEFT HEART CATHETERIZATION WITH CORONARY ANGIOGRAM;  Surgeon: Burnell Blanks, MD;  Location: [facility] CATH LAB;  Service: Cardiovascular;  Laterality: N/A;  . LOBECTOMY Left 12/15/2013   Procedure: LOBECTOMY;  Surgeon: Grace Isaac, MD;  Location: [facility];  Service: Thoracic;  Laterality:  Left;  . PERCUTANEOUS STENT INTERVENTION Right 11/08/2013   Procedure: PERCUTANEOUS STENT INTERVENTION;  Surgeon: Lorretta Harp, MD;  Location[facility] CATH LAB;  Service: Cardiovascular;  Laterality: Right;  rt renal artery stent  . RENAL ANGIOGRAM Bilateral 11/08/2013   Procedure: RENAL ANGIOGRAM;  Surgeon: Lorretta Harp, MD;  Location: [facility] CATH LAB;  Service: Cardiovascular;  Laterality: Bilateral;  . RENAL ARTERY STENT Right 11/08/2013   Archie Endo 11/08/2013  . SKIN CANCER EXCISION    . TONSILLECTOMY AND ADENOIDECTOMY  ?1946  . VAGINAL  HYSTERECTOMY  1970  . VIDEO ASSISTED THORACOSCOPY (VATS)/WEDGE RESECTION Left 12/15/2013   Procedure: VIDEO ASSISTED THORACOSCOPY (VATS)/WEDGE RESECTION;  Surgeon: Grace Isaac, MD;  Location: [facility];  Service: Thoracic;  Laterality: Left;  Marland Kitchen VIDEO BRONCHOSCOPY N/A 12/15/2013   Procedure: VIDEO BRONCHOSCOPY;  Surgeon: Grace Isaac, MD;  Location: [facility] OR;  Service: Thoracic;  Laterality: N/A;    Current Medications: Outpatient Medications Prior to Visit  Medication Sig Dispense Refill  . albuterol (PROVENTIL HFA;VENTOLIN HFA) 108 (90 BASE) MCG/ACT inhaler Inhale 1-2 puffs into the lungs every 6 (six) hours as needed for wheezing or shortness of breath. 18 g 1  . allopurinol (ZYLOPRIM) 100 MG tablet Take 1 tablet (100 mg total) by mouth 2 (two) times daily. Takes 2 tablets in am 180 tablet 0  . beclomethasone (QVAR) 80 MCG/ACT inhaler Inhale 2 puffs into the lungs 2 (two) times daily. 1 Inhaler 12  . Calcium Carb-Cholecalciferol 600-800 MG-UNIT TABS Take 2 tablets by mouth daily.     . cetirizine (ZYRTEC) 10 MG tablet Take 10 mg by mouth at bedtime.     . fluticasone (FLONASE) 50 MCG/ACT nasal spray USE TWO SPRAY(S) IN EACH NOSTRIL ONCE DAILY 16 g 2  . furosemide (LASIX) 40 MG tablet TAKE 1 TABLET BY MOUTH EVERY EVENING UNTIL WEIGHT OF 219 IS REACHED (Patient taking differently: pt takes 1 tablet by mouth BID) 90 tablet 0  . glucose blood (ONE TOUCH ULTRA TEST) test strip Dx E09.9. Please use one strip each time sugars are checked. Pt tests 2 times daily. 100 each 6  . hydrALAZINE (APRESOLINE) 50 MG tablet Take 1 tablet (50 mg total) by mouth 2 (two) times daily. 180 tablet 1  . Lancets (ONETOUCH ULTRASOFT) lancets Use 1 per day dx code 249.00 100 each 12  . lansoprazole (PREVACID) 15 MG capsule Take 15 mg by mouth daily at 12 noon.    Marland Kitchen levothyroxine (SYNTHROID, LEVOTHROID) 88 MCG tablet TAKE ONE TABLET BY MOUTH ONCE DAILY BEFORE  BREAKFAST 90 tablet 0  . mirabegron ER (MYRBETRIQ) 25 MG  TB24 tablet Take 1 tablet (25 mg total) by mouth daily. 30 tablet 3  . nebivolol (BYSTOLIC) 5 MG tablet Take 5 mg by mouth daily.    [name] DELICA LANCETS 93G MISC Use to check blood sugar once daily as instructed. Dx code: E11.9 100 each 2  . sertraline (ZOLOFT) 50 MG tablet TAKE ONE & ONE-HALF TABLETS BY MOUTH ONCE DAILY 126 tablet 0  . solifenacin (VESICARE) 5 MG tablet Take 1 tablet (5 mg total) by mouth daily. 30 tablet 3   No facility-administered medications prior to visit.      Allergies:   Benazepril hcl; Loratadine; Fexofenadine; Allegra [fexofenadine hcl]; Celecoxib; Lisinopril; Sulfa antibiotics; and Sulfur   Social History   Social History  . Marital status: Married    Spouse name: N/A  . Number of children: N/A  . Years of education: N/A  Occupational History  . Retired    Social History Main Topics  . Smoking status: Never Smoker  . Smokeless tobacco: Never Used  . Alcohol use No  . Drug use: No  . Sexual activity: Yes   Other Topics Concern  . Not on file   Social History Narrative  . No narrative on file     Family History:  The patient's ***family history includes Allergies in her brother and sister; Breast cancer in her sister; Diabetes in her brother and father; Emphysema in her father; Heart disease in her father and mother; Stroke in her father.   ROS:   Please see the history of present illness.    ROS All other systems reviewed and are negative.   PHYSICAL EXAM:   VS:  There were no vitals taken for this visit.   GEN: Well nourished, well developed, in no acute distress  HEENT: normal  Neck: no JVD, carotid bruits, or masses Cardiac: ***RRR; no murmurs, rubs, or gallops,no edema  Respiratory:  clear to auscultation bilaterally, normal work of breathing GI: soft, nontender, nondistended, + BS MS: no deformity or atrophy  Skin: warm and dry, no rash Neuro:  Alert and Oriented x 3, Strength and sensation are intact Psych: euthymic mood,  full affect  Wt Readings from Last 3 Encounters:  12/25/16 239 lb 6 oz (108.6 kg)  11/29/16 230 lb (104.3 kg)  11/22/16 230 lb (104.3 kg)      Studies/Labs Reviewed:   EKG:  EKG is*** ordered today.  The ekg ordered today demonstrates ***  Recent Labs: 09/16/2016: ALT 11; BUN 50; Creatinine, Ser 1.58; Potassium 4.8; Sodium 145 12/25/2016: Hemoglobin 11.9; Platelets 314.0; Pro B Natriuretic peptide (BNP) 224.0; TSH 1.23   Lipid Panel    Component Value Date/Time   CHOL 213 (H) 09/16/2016 0917   TRIG 156.0 (H) 09/16/2016 0917   HDL 48.10 09/16/2016 0917   CHOLHDL 4 09/16/2016 0917   VLDL 31.2 09/16/2016 0917   LDLCALC 134 (H) 09/16/2016 0917   LDLDIRECT 126.3 09/10/2012 1537    Additional studies/ records that were reviewed today include:   PFT 12/09/2013    Echo 05/28/2014 LV EF: 55% -  60%  - Left ventricle: The cavity size was normal. Wall thickness was increased in a pattern of mild LVH. Systolic function was normal. The estimated ejection fraction was in the range of 55% to 60%. Wall motion was normal; there were no regional wall motion abnormalities. Doppler parameters are consistent with abnormal left ventricular relaxation (grade 1 diastolic dysfunction). - Left atrium: The atrium was mildly dilated. - Pericardium, extracardiac: A small pericardial effusion was identified. There was mildright atrial chamber collapse.  Impressions:  - Normal LV function; small pericardial effusion.    Renal doppler 04/09/2016 Impressions Normal caliber abdominal aorta. Normal and symmetrical kidney size. Normal renal arteries, bilaterally, s/p stent on the right. The IVC and renal veins are patent.     ASSESSMENT:    No diagnosis found.   PLAN:  In order of problems listed above:  1. ***    Medication Adjustments/Labs and Tests Ordered: Current medicines are reviewed at length with the patient today.  Concerns regarding medicines are outlined  above.  Medication changes, Labs and Tests ordered today are listed in the Patient Instructions below. There are no Patient Instructions on file for this visit.   Hilbert Corrigan, [place]  01/13/2017 8:42 AM    [place],  [place]  [address] Phone: [phone]; Fax: [phone]

## 2017-01-15 ENCOUNTER — Ambulatory Visit (INDEPENDENT_AMBULATORY_CARE_PROVIDER_SITE_OTHER): Payer: Medicare Other | Admitting: Physician Assistant

## 2017-01-15 ENCOUNTER — Encounter: Payer: Self-pay | Admitting: Physician Assistant

## 2017-01-15 VITALS — BP 120/70 | HR 57 | Ht 65.0 in | Wt 237.0 lb

## 2017-01-15 DIAGNOSIS — E785 Hyperlipidemia, unspecified: Secondary | ICD-10-CM | POA: Diagnosis not present

## 2017-01-15 DIAGNOSIS — R0609 Other forms of dyspnea: Secondary | ICD-10-CM | POA: Diagnosis not present

## 2017-01-15 DIAGNOSIS — R42 Dizziness and giddiness: Secondary | ICD-10-CM

## 2017-01-15 DIAGNOSIS — E039 Hypothyroidism, unspecified: Secondary | ICD-10-CM

## 2017-01-15 DIAGNOSIS — I5032 Chronic diastolic (congestive) heart failure: Secondary | ICD-10-CM | POA: Diagnosis not present

## 2017-01-15 DIAGNOSIS — I701 Atherosclerosis of renal artery: Secondary | ICD-10-CM

## 2017-01-15 NOTE — Patient Instructions (Addendum)
Medication Instructions:  Your physician recommends that you continue on your current medications as directed. Please refer to the Current Medication list given to you today.  If you need a refill on your cardiac medications before your next appointment, please call your pharmacy.  Labwork: NONE  Testing/Procedures: Your physician has requested that you have an echocardiogram. Echocardiography is a painless test that uses sound waves to create images of your heart. It provides your doctor with information about the size and shape of your heart and how well your heart's chambers and valves are working. This procedure takes approximately one hour. There are no restrictions for this procedure.  Follow-Up: Your physician wants you to follow-up in: 1-2 MONTHS, AFTER ECHO, WITH DR ARIDA.   Thank you for choosing [facility]!!    HAO MENG, PA-C [place], LPN

## 2017-01-15 NOTE — Progress Notes (Signed)
Cardiology Office Note    Date:  01/15/2017   ID:  GLORIS Pittman, DOB 07/02/1941, MRN [id number]  PCP:  [name], MD  Cardiologist:  Dr. Fletcher Anon  Chief Complaint  Patient presents with  . Follow-up    seen for Dr. Fletcher Anon, DOE, occassional dizziness, low BP    History of Present Illness:  Susan Pittman is a 76 y.o. female with PMH of HTN, HLD, hypothyroidism, remote peptic ulcer disease in 1980s, GERD, right renal artery stenosis. As part of her workup for hypertension, she was found to have significant renal artery stenosis. She underwent stenting of her right renal artery by Dr. Gwenlyn Found on 11/08/2013 after a renal Doppler suggested hemodynamically significant right renal artery stenosis. She subsequently presented to the hospital with chest pain on 2/80/2015 and underwent a cardiac catheterization. She was found to have mild nonobstructive CAD with smooth 20% RCA narrowing. EF was 65%. She was found to have mild nonobstructive CAD with smooth 20% RCA narrowing, EF 65%. She also noted to have a lung nodule. On 12/15/2013, she underwent VATS and mini thoracotomy with lobectomy of the left lower lobe with lymph node dissection for adenocarcinoma. She has a history of known IgA nephropathy which was treated with prednisone. Her last renal Doppler obtained on 04/09/2016 showed normal renal arteries bilaterally. She has complex cystic lesion noted in the upper pole of the left kidney in August 2017 via abdominal CT.  She presents to cardiology office today for further evaluation. She says she has been having shortness of breath associated with dizziness for the past several weeks. Her shortness of breath usually occurs with exertion however is not very consistent. Sometimes she would become short of breath walking from the bedroom to the kitchen, where as other time she can accompany her husband and walk around in the supermarket for a long time without any shortness of breath. She only noticed  dizziness during exertion as well. She never noticed any dizziness in the morning when she first gets up from the bed. She said her dizziness can come out of nowhere. Other review of recent workup include stable hemoglobin 3 weeks ago, her pro BNP was only 224. She did take extra doses of Lasix after being instructed by triage nurse prior to scheduling today's appointment. However she did not notice any significant change with additional dose of Lasix. On physical exam today, she has no sign of heart failure. She does not have any lower extremity edema, her lung is very much clear at this time. Chest x-ray obtained on 12/25/2016 showed no acute process. Her chance of having ischemic is very low given minimal disease on cardiac catheterization in 2015. Given her dyspnea on exertion, I recommended a repeat echocardiogram. If echo is normal, I would recommend hold off on additional cardiac workup and see how she does. She did mention she was recently started on new medication called Mirabegron/myrbetriq, however I did not see shortness of breath and dizziness listed as a side effect. Her TSH was normal. I will arrange follow-up with Dr. Fletcher Anon, if echocardiogram is normal, may potentially consider holding the Myrbetriq and nebivolol and see how she does. My suspicion of significant bradycardia to cause her symptom is very low based on today's EKG. Her heart rate remained in the 50s range. Also if she indeed have symptomatic bradycardia, I cannot explain why she would not have any symptoms at rest.    Past Medical History:  Diagnosis Date  . Allergy   .  Arthritis    "knees, hips, back, hands" (11/08/2013)  . Asthma   . Basal cell carcinoma    "cut them off face & right shoulder" (11/08/2013)  . CAD (coronary artery disease), non obstructive by cardiac cath 12/12/13 01/24/2014  . CKD (chronic kidney disease), stage III   . Diastolic CHF ([place])   . DJD (degenerative joint disease)   . Gastric ulcer   . GERD  (gastroesophageal reflux disease)   . Hiatal hernia   . History of steroid induced diabetes   . Hyperlipidemia    "don't take RX for it" (11/08/2013)  . Hypertension   . Hypothyroid   . Lung mass    superior segment LLL/notes 11/08/2013  . Lung nodule/ adenoca > LLobectomy 12/15/13  11/08/2013   PET 11/23/2013 1. Dominant sub solid left lower lobe nodule does not demonstrate significantly increased metabolic activity. However, morphologically, adenocarcinoma is still a concern.   - Spirometry 11/24/13 wnl  - 11/24/2013 T surg eval rec> LLower lobectomy 12/15/2013 for adenoca   . Nephropathy   . Osteoporosis   . Pneumonia    "I've had it ?3 times" (11/08/2013)  . Renal artery stenosis ([place])    with stent placement    Past Surgical History:  Procedure Laterality Date  . ANTERIOR CERVICAL DECOMP/DISCECTOMY FUSION  2000   C5-C6  . CARDIAC CATHETERIZATION     Hx: of 2/ 2015  . CHOLECYSTECTOMY  1970's  . CORONARY ANGIOGRAM     Hx: of 2/ 2015  . DILATION AND CURETTAGE OF UTERUS  1960's  . KNEE ARTHROSCOPY Bilateral   . LEFT HEART CATHETERIZATION WITH CORONARY ANGIOGRAM N/A 12/13/2013   Procedure: LEFT HEART CATHETERIZATION WITH CORONARY ANGIOGRAM;  Surgeon: Burnell Blanks, MD;  Location: [facility] CATH LAB;  Service: Cardiovascular;  Laterality: N/A;  . LOBECTOMY Left 12/15/2013   Procedure: LOBECTOMY;  Surgeon: Grace Isaac, MD;  Location: [place];  Service: Thoracic;  Laterality: Left;  . PERCUTANEOUS STENT INTERVENTION Right 11/08/2013   Procedure: PERCUTANEOUS STENT INTERVENTION;  Surgeon: Lorretta Harp, MD;  Location: [facility] CATH LAB;  Service: Cardiovascular;  Laterality: Right;  rt renal artery stent  . RENAL ANGIOGRAM Bilateral 11/08/2013   Procedure: RENAL ANGIOGRAM;  Surgeon: Lorretta Harp, MD;  Location: [facility] CATH LAB;  Service: Cardiovascular;  Laterality: Bilateral;  . RENAL ARTERY STENT Right 11/08/2013   Archie Endo 11/08/2013  . SKIN CANCER EXCISION    . TONSILLECTOMY AND ADENOIDECTOMY   ?1946  . VAGINAL HYSTERECTOMY  1970  . VIDEO ASSISTED THORACOSCOPY (VATS)/WEDGE RESECTION Left 12/15/2013   Procedure: VIDEO ASSISTED THORACOSCOPY (VATS)/WEDGE RESECTION;  Surgeon: Grace Isaac, MD;  Location: [place];  Service: Thoracic;  Laterality: Left;  Marland Kitchen VIDEO BRONCHOSCOPY N/A 12/15/2013   Procedure: VIDEO BRONCHOSCOPY;  Surgeon: Grace Isaac, MD;  [facility];  Service: Thoracic;  Laterality: N/A;    Current Medications: Outpatient Medications Prior to Visit  Medication Sig Dispense Refill  . albuterol (PROVENTIL HFA;VENTOLIN HFA) 108 (90 BASE) MCG/ACT inhaler Inhale 1-2 puffs into the lungs every 6 (six) hours as needed for wheezing or shortness of breath. 18 g 1  . allopurinol (ZYLOPRIM) 100 MG tablet Take 1 tablet (100 mg total) by mouth 2 (two) times daily. Takes 2 tablets in am 180 tablet 0  . beclomethasone (QVAR) 80 MCG/ACT inhaler Inhale 2 puffs into the lungs 2 (two) times daily. 1 Inhaler 12  . Calcium Carb-Cholecalciferol 600-800 MG-UNIT TABS Take 2 tablets by mouth daily.     Marland Kitchen  cetirizine (ZYRTEC) 10 MG tablet Take 10 mg by mouth at bedtime.     . fluticasone (FLONASE) 50 MCG/ACT nasal spray USE TWO SPRAY(S) IN EACH NOSTRIL ONCE DAILY 16 g 2  . glucose blood (ONE TOUCH ULTRA TEST) test strip Dx E09.9. Please use one strip each time sugars are checked. Pt tests 2 times daily. 100 each 6  . hydrALAZINE (APRESOLINE) 50 MG tablet Take 1 tablet (50 mg total) by mouth 2 (two) times daily. 180 tablet 1  . Lancets (ONETOUCH ULTRASOFT) lancets Use 1 per day dx code 249.00 100 each 12  . lansoprazole (PREVACID) 15 MG capsule Take 15 mg by mouth daily at 12 noon.    Marland Kitchen levothyroxine (SYNTHROID, LEVOTHROID) 88 MCG tablet TAKE ONE TABLET BY MOUTH ONCE DAILY BEFORE  BREAKFAST 90 tablet 0  . mirabegron ER (MYRBETRIQ) 25 MG TB24 tablet Take 1 tablet (25 mg total) by mouth daily. 30 tablet 3  . nebivolol (BYSTOLIC) 5 MG tablet Take 5 mg by mouth daily.    [name] DELICA LANCETS  40G MISC Use to check blood sugar once daily as instructed. Dx code: E11.9 100 each 2  . sertraline (ZOLOFT) 50 MG tablet TAKE ONE & ONE-HALF TABLETS BY MOUTH ONCE DAILY 126 tablet 0  . furosemide (LASIX) 40 MG tablet TAKE 1 TABLET BY MOUTH EVERY EVENING UNTIL WEIGHT OF 219 IS REACHED (Patient not taking: Reported on 01/15/2017) 90 tablet 0  . solifenacin (VESICARE) 5 MG tablet Take 1 tablet (5 mg total) by mouth daily. (Patient not taking: Reported on 01/15/2017) 30 tablet 3   No facility-administered medications prior to visit.      Allergies:   Benazepril hcl; Loratadine; Allegra [fexofenadine hcl]; Celecoxib; Fexofenadine; Lisinopril; Sulfa antibiotics; and Sulfur   Social History   Social History  . Marital status: Married    Spouse name: N/A  . Number of children: N/A  . Years of education: N/A   Occupational History  . Retired    Social History Main Topics  . Smoking status: Never Smoker  . Smokeless tobacco: Never Used  . Alcohol use No  . Drug use: No  . Sexual activity: Yes   Other Topics Concern  . None   Social History Narrative  . None     Family History:  The patient's family history includes Allergies in her brother and sister; Breast cancer in her sister; Diabetes in her brother and father; Emphysema in her father; Heart disease in her father and mother; Stroke in her father.   ROS:   Please see the history of present illness.    ROS All other systems reviewed and are negative.   PHYSICAL EXAM:   VS:  BP 120/70   Pulse (!) 57   Ht '5\' 5"'$  (1.651 m)   Wt 237 lb (107.5 kg)   BMI 39.44 kg/m    GEN: Well nourished, well developed, in no acute distress  HEENT: normal  Neck: no JVD, carotid bruits, or masses Cardiac: RRR; no murmurs, rubs, or gallops,no edema  Respiratory:  clear to auscultation bilaterally, normal work of breathing GI: soft, nontender, nondistended, + BS MS: no deformity or atrophy  Skin: warm and dry, no rash Neuro:  Alert and Oriented  x 3, Strength and sensation are intact Psych: euthymic mood, full affect  Wt Readings from Last 3 Encounters:  01/15/17 237 lb (107.5 kg)  12/25/16 239 lb 6 oz (108.6 kg)  11/29/16 230 lb (104.3 kg)  Studies/Labs Reviewed:   EKG:  EKG is ordered today.  The ekg ordered today demonstrates Sinus bradycardia, heart rate 57, no obvious ST-T wave changes.  Recent Labs: 09/16/2016: ALT 11; BUN 50; Creatinine, Ser 1.58; Potassium 4.8; Sodium 145 12/25/2016: Hemoglobin 11.9; Platelets 314.0; Pro B Natriuretic peptide (BNP) 224.0; TSH 1.23   Lipid Panel    Component Value Date/Time   CHOL 213 (H) 09/16/2016 0917   TRIG 156.0 (H) 09/16/2016 0917   HDL 48.10 09/16/2016 0917   CHOLHDL 4 09/16/2016 0917   VLDL 31.2 09/16/2016 0917   LDLCALC 134 (H) 09/16/2016 0917   LDLDIRECT 126.3 09/10/2012 1537    Additional studies/ records that were reviewed today include:   Cath 12/13/2013 Angiographic Findings:  Left main: No obstructive disease.   Left Anterior Descending Artery: Large caliber vessel that courses to the apex. There are several small caliber diagonal branches. No obstructive disease noted.   Circumflex Artery: Moderate caliber vessel with moderate caliber bifurcating OM branch. No obstructive disease.   Right Coronary Artery: Large dominant vessel with 20% smooth stenosis in the mid vessel.   Left Ventricular Angiogram: LVEF=65%.   Distal aortogram: Mild plaque distal aorta. Right renal artery stent is patent. The left renal artery has no significant stenosis.   Impression: 1. Mild non-obstructive CAD 2. Normal LV systolic function 3. Patent right renal artery stent without restenosis  Recommendations: Continue medical management.           Echo 05/28/2014 LV EF: 55% -  60%  - Left ventricle: The cavity size was normal. Wall thickness was increased in a pattern of mild LVH. Systolic function was normal. The estimated ejection fraction was in the  range of 55% to 60%. Wall motion was normal; there were no regional wall motion abnormalities. Doppler parameters are consistent with abnormal left ventricular relaxation (grade 1 diastolic dysfunction). - Left atrium: The atrium was mildly dilated. - Pericardium, extracardiac: A small pericardial effusion was identified. There was mildright atrial chamber collapse.  Impressions:  - Normal LV function; small pericardial effusion.     Renal arterial doppler 04/09/2016 Impressions Normal caliber abdominal aorta. Normal and symmetrical kidney size. Normal renal arteries, bilaterally, s/p stent on the right. The IVC and renal veins are patent.    Abdominal CT WO contrast 06/18/2016 IMPRESSION: 1. Complex cystic lesion upper pole of the LEFT kidney is unchanged in size from 10/26/2015; however, is increased in septal complexity with more prominent intervening septations. Recommend follow-up MRI in 12 months. 2. Minimally complex lesion in the mid LEFT kidney is not changed. 3. Stable cystic lesion of the pancreas appears benign.  ASSESSMENT:    1. DOE (dyspnea on exertion)   2. Chronic diastolic heart failure (HCC)   3. Stenosis of right renal artery (HCC)   4. Dizziness   5. Hypothyroidism, unspecified type   6. Hyperlipidemia, unspecified hyperlipidemia type      PLAN:  In order of problems listed above:  1. Dyspnea on exertion: No obvious chest pain, suspicion for ischemia quite low. EKG does not show any significant ST-T wave changes. Previous cardiac catheterization in 2015 only showed minimal disease in the RCA. She does not appears to be volume overloaded based on physical exam, I do not think her symptoms coming from acute heart failure. She did take an additional dose of Lasix, she says it has not helped with her symptom. Based on lab work obtained 3 weeks ago, she is not anemic, her pro BNP is only 200,  whereas in 2015, her pro BNP was 10,000. Chest x-ray  obtained on 12/25/2016 did not reveal any acute process. I recommended a repeat echocardiogram as a preliminary workup. If echocardiogram is normal, suspicion for cardiac event is quite low, then I would recommend potentially stop the nebivolol and Myrbetriq and see if her symptom would improve. My suspicion for symptomatic bradycardia is also very low, for this reason, I did not recommend any heart monitor today. I would also suggest increase exercise.   2. Dizziness: Also occurs during exertion, she denies any dizziness associated with body position changes. Unclear cause. She is not severely bradycardia, she does not have significant anemia either. Her most recent TSH was normal.  3. Right renal artery stenosis s/p stent in 2015: Last Doppler in 2017 showed patent renal arteries bilaterally  4. Pulmonary adenocarcinoma s/p left lobectomy in 2015: Recent chest x-ray shows no acute process. I did not hear any obvious rale or markedly diminished regional breath sound on physical exam.  5. Hypothyroidism: Recent TSH was normal. On Synthroid  6. Hyperlipidemia: Recent lipid panel obtained on 09/16/2016 showed cholesterol 213, HDL 48, LDL 134, triglyceride 156. Not currently on a statin. We will defer to PCP.    Medication Adjustments/Labs and Tests Ordered: Current medicines are reviewed at length with the patient today.  Concerns regarding medicines are outlined above.  Medication changes, Labs and Tests ordered today are listed in the Patient Instructions below. Patient Instructions  Medication Instructions:  Your physician recommends that you continue on your current medications as directed. Please refer to the Current Medication list given to you today.  If you need a refill on your cardiac medications before your next appointment, please call your pharmacy.  Labwork: NONE  Testing/Procedures: Your physician has requested that you have an echocardiogram. Echocardiography is a painless test  that uses sound waves to create images of your heart. It provides your doctor with information about the size and shape of your heart and how well your heart's chambers and valves are working. This procedure takes approximately one hour. There are no restrictions for this procedure.  Follow-Up: Your physician wants you to follow-up in: 1-2 MONTHS, AFTER ECHO, WITH DR ARIDA.   Thank you for choosing [facility] at [facility]!!    Londynn Sonoda, PA-C [place], LPN     Signed, Almyra Deforest, [place]  01/15/2017 3:41 PM    [facility] [address], [place], [place]  [address] Phone: [phone]; Fax: [phone]

## 2017-01-30 ENCOUNTER — Other Ambulatory Visit: Payer: Self-pay

## 2017-01-30 ENCOUNTER — Ambulatory Visit (HOSPITAL_COMMUNITY): Payer: Medicare Other | Attending: Cardiovascular Disease

## 2017-01-30 DIAGNOSIS — N189 Chronic kidney disease, unspecified: Secondary | ICD-10-CM | POA: Insufficient documentation

## 2017-01-30 DIAGNOSIS — E785 Hyperlipidemia, unspecified: Secondary | ICD-10-CM | POA: Diagnosis not present

## 2017-01-30 DIAGNOSIS — I081 Rheumatic disorders of both mitral and tricuspid valves: Secondary | ICD-10-CM | POA: Diagnosis not present

## 2017-01-30 DIAGNOSIS — R0609 Other forms of dyspnea: Secondary | ICD-10-CM | POA: Diagnosis not present

## 2017-01-30 DIAGNOSIS — Z8249 Family history of ischemic heart disease and other diseases of the circulatory system: Secondary | ICD-10-CM | POA: Diagnosis not present

## 2017-01-30 DIAGNOSIS — I509 Heart failure, unspecified: Secondary | ICD-10-CM | POA: Diagnosis not present

## 2017-01-30 DIAGNOSIS — Z902 Acquired absence of lung [part of]: Secondary | ICD-10-CM | POA: Diagnosis not present

## 2017-01-30 DIAGNOSIS — J45909 Unspecified asthma, uncomplicated: Secondary | ICD-10-CM | POA: Diagnosis not present

## 2017-01-30 DIAGNOSIS — Z85118 Personal history of other malignant neoplasm of bronchus and lung: Secondary | ICD-10-CM | POA: Diagnosis not present

## 2017-01-30 DIAGNOSIS — I130 Hypertensive heart and chronic kidney disease with heart failure and stage 1 through stage 4 chronic kidney disease, or unspecified chronic kidney disease: Secondary | ICD-10-CM | POA: Insufficient documentation

## 2017-01-30 DIAGNOSIS — I2510 Atherosclerotic heart disease of native coronary artery without angina pectoris: Secondary | ICD-10-CM | POA: Diagnosis not present

## 2017-01-31 ENCOUNTER — Telehealth: Payer: Self-pay | Admitting: *Deleted

## 2017-01-31 NOTE — Telephone Encounter (Signed)
Left msg for patient to call. 

## 2017-01-31 NOTE — Telephone Encounter (Signed)
-----   Message from [place], [place] sent at 01/31/2017  8:44 AM EDT ----- Normal pumping function of heart, only mild leakage of mitral valve which is not unexpected with age and does not need to be fixed. No echo find to explain shortness of breath with exertion. Recommend continue activity as tolerated. Monitor daily heart rate at rest, if heart rate consistent dip below 45 beats per min, may come off nebivilol for 2 weeks as a trial to see if her shortness of breath and dizziness will improve.

## 2017-02-03 ENCOUNTER — Encounter: Payer: Self-pay | Admitting: Cardiovascular Disease

## 2017-02-03 ENCOUNTER — Encounter: Payer: Self-pay | Admitting: Family Medicine

## 2017-02-03 NOTE — Telephone Encounter (Signed)
Susan Pittman is returning a call about her echo results . Please call .Marland Kitchen Thanks

## 2017-02-03 NOTE — Telephone Encounter (Signed)
Pt aware of Echo results by phone. The pt was advised to monitor her pulse at rest and contact the office if she notices consistent readings of 45 and below so that additional instructions can be given to hold bystolic. Pt agreed with plan.

## 2017-02-03 NOTE — Telephone Encounter (Signed)
This encounter was created in error - please disregard.

## 2017-02-10 ENCOUNTER — Other Ambulatory Visit: Payer: Self-pay | Admitting: Family Medicine

## 2017-02-10 DIAGNOSIS — R32 Unspecified urinary incontinence: Secondary | ICD-10-CM

## 2017-02-24 ENCOUNTER — Encounter: Payer: Self-pay | Admitting: *Deleted

## 2017-02-24 NOTE — Progress Notes (Signed)
Oncology Nurse Navigator Documentation  Oncology Nurse Navigator Flowsheets 02/24/2017  Navigator Location [facility]  Navigator Encounter Type Other/per Dr. Julien Nordmann, he requested patient be re-scheduled.  I notified scheduling team to re-schedule next available.   Treatment Phase Follow-up  Barriers/Navigation Needs Coordination of Care  Interventions Coordination of Care  Coordination of Care Appts  Acuity Level 2  Acuity Level 2 Other  Time Spent with Patient 15

## 2017-02-25 ENCOUNTER — Telehealth: Payer: Self-pay | Admitting: Internal Medicine

## 2017-02-25 NOTE — Telephone Encounter (Signed)
Unable to reach patient - left message with appt date and time. R/s per sch message from [facility] 4/23

## 2017-02-28 ENCOUNTER — Other Ambulatory Visit: Payer: Self-pay | Admitting: Medical Oncology

## 2017-02-28 DIAGNOSIS — C3432 Malignant neoplasm of lower lobe, left bronchus or lung: Secondary | ICD-10-CM

## 2017-03-03 ENCOUNTER — Other Ambulatory Visit: Payer: Medicare Other

## 2017-03-03 ENCOUNTER — Ambulatory Visit (HOSPITAL_COMMUNITY): Payer: Medicare Other

## 2017-03-03 ENCOUNTER — Ambulatory Visit: Payer: Medicare Other | Admitting: Internal Medicine

## 2017-03-05 ENCOUNTER — Encounter (HOSPITAL_COMMUNITY): Payer: Self-pay

## 2017-03-05 ENCOUNTER — Ambulatory Visit (HOSPITAL_COMMUNITY)
Admission: RE | Admit: 2017-03-05 | Discharge: 2017-03-05 | Disposition: A | Discharge status: Home / Self Care | Payer: Medicare Other | Source: Ambulatory Visit | Attending: Internal Medicine | Admitting: Internal Medicine

## 2017-03-05 ENCOUNTER — Other Ambulatory Visit (HOSPITAL_BASED_OUTPATIENT_CLINIC_OR_DEPARTMENT_OTHER): Payer: Medicare Other

## 2017-03-05 DIAGNOSIS — Z85118 Personal history of other malignant neoplasm of bronchus and lung: Secondary | ICD-10-CM

## 2017-03-05 DIAGNOSIS — Q6102 Congenital multiple renal cysts: Secondary | ICD-10-CM

## 2017-03-05 DIAGNOSIS — R918 Other nonspecific abnormal finding of lung field: Secondary | ICD-10-CM | POA: Diagnosis not present

## 2017-03-05 DIAGNOSIS — Z902 Acquired absence of lung [part of]: Secondary | ICD-10-CM | POA: Insufficient documentation

## 2017-03-05 DIAGNOSIS — Z9889 Other specified postprocedural states: Secondary | ICD-10-CM | POA: Insufficient documentation

## 2017-03-05 DIAGNOSIS — C3432 Malignant neoplasm of lower lobe, left bronchus or lung: Secondary | ICD-10-CM

## 2017-03-05 DIAGNOSIS — I2510 Atherosclerotic heart disease of native coronary artery without angina pectoris: Secondary | ICD-10-CM | POA: Insufficient documentation

## 2017-03-05 DIAGNOSIS — I700 Atherosclerosis of aorta: Secondary | ICD-10-CM | POA: Insufficient documentation

## 2017-03-05 LAB — COMPREHENSIVE METABOLIC PANEL
ALBUMIN: 3.9 g/dL (ref 3.5–5.0)
ALK PHOS: 98 U/L (ref 40–150)
ALT: 10 U/L (ref 0–55)
AST: 13 U/L (ref 5–34)
Anion Gap: 11 mEq/L (ref 3–11)
BILIRUBIN TOTAL: 0.45 mg/dL (ref 0.20–1.20)
BUN: 54.4 mg/dL — AB (ref 7.0–26.0)
CO2: 28 mEq/L (ref 22–29)
Calcium: 9.9 mg/dL (ref 8.4–10.4)
Chloride: 104 mEq/L (ref 98–109)
Creatinine: 1.7 mg/dL — ABNORMAL HIGH (ref 0.6–1.1)
EGFR: 28 mL/min/{1.73_m2} — ABNORMAL LOW (ref >90)
GLUCOSE: 104 mg/dL (ref 70–140)
POTASSIUM: 4.7 meq/L (ref 3.5–5.1)
SODIUM: 144 meq/L (ref 136–145)
TOTAL PROTEIN: 7.1 g/dL (ref 6.4–8.3)

## 2017-03-05 LAB — CBC WITH DIFFERENTIAL/PLATELET
BASO%: 0.5 % (ref 0.0–2.0)
Basophils Absolute: 0 10*3/uL (ref 0.0–0.1)
EOS%: 2.6 % (ref 0.0–7.0)
Eosinophils Absolute: 0.2 10*3/uL (ref 0.0–0.5)
HCT: 37 % (ref 34.8–46.6)
HEMOGLOBIN: 11.5 g/dL — AB (ref 11.6–15.9)
LYMPH%: 22.4 % (ref 14.0–49.7)
MCH: 26.4 pg (ref 25.1–34.0)
MCHC: 31.1 g/dL — ABNORMAL LOW (ref 31.5–36.0)
MCV: 85.1 fL (ref 79.5–101.0)
MONO#: 0.6 10*3/uL (ref 0.1–0.9)
MONO%: 6.4 % (ref 0.0–14.0)
NEUT#: 5.9 10*3/uL (ref 1.5–6.5)
NEUT%: 68.1 % (ref 38.4–76.8)
Platelets: 302 10*3/uL (ref 145–400)
RBC: 4.35 10*6/uL (ref 3.70–5.45)
RDW: 16.6 % — AB (ref 11.2–14.5)
WBC: 8.6 10*3/uL (ref 3.9–10.3)
lymph#: 1.9 10*3/uL (ref 0.9–3.3)

## 2017-03-06 ENCOUNTER — Ambulatory Visit (HOSPITAL_BASED_OUTPATIENT_CLINIC_OR_DEPARTMENT_OTHER): Payer: Medicare Other | Admitting: Internal Medicine

## 2017-03-06 ENCOUNTER — Encounter: Payer: Self-pay | Admitting: Internal Medicine

## 2017-03-06 ENCOUNTER — Ambulatory Visit (HOSPITAL_COMMUNITY): Payer: Medicare Other

## 2017-03-06 ENCOUNTER — Telehealth: Payer: Self-pay | Admitting: Internal Medicine

## 2017-03-06 VITALS — BP 156/76 | HR 54 | Temp 97.7°F | Resp 22 | Ht 65.0 in | Wt 236.7 lb

## 2017-03-06 DIAGNOSIS — C3432 Malignant neoplasm of lower lobe, left bronchus or lung: Secondary | ICD-10-CM

## 2017-03-06 DIAGNOSIS — I10 Essential (primary) hypertension: Secondary | ICD-10-CM

## 2017-03-06 DIAGNOSIS — Z85118 Personal history of other malignant neoplasm of bronchus and lung: Secondary | ICD-10-CM | POA: Diagnosis not present

## 2017-03-06 NOTE — Telephone Encounter (Signed)
Gave patient AVS and calender per 5/3 los - return next year with lab and Ct before MD visit. [facility] to contact patient with Ct schedule.

## 2017-03-06 NOTE — Progress Notes (Signed)
[place] [phone]   [phone]  OFFICE PROGRESS NOTE  [name], MD [address] [place] [address]  DIAGNOSIS: Stage IA (T1a, N0, M0) non-small cell lung cancer, adenocarcinoma diagnosed in January of 2015.  PRIOR THERAPY: Bronchoscopy, left video-assisted thoracoscopy, with wedge resection for biopsy of left lower lobe lesion, completion left lower lobectomy, lymph node dissection under the care of Dr. Servando Snare  CURRENT THERAPY: Observation.  INTERVAL HISTORY: Susan Pittman 76 y.o. female returns to the clinic today for follow-up visit accompanied by her husband. The patient is feeling fine today was no specific complaints except for shortness of breath with exertion. She also has some dizzy spells. She is followed by cardiology and she is currently on Bystolic. She is bradycardic. She denied having any chest pain, cough or hemoptysis. She denied having any recent weight loss or night sweats. She has no nausea or vomiting. The patient had repeat CT scan of the chest and she is here today for evaluation and discussion of her scan results.   MEDICAL HISTORY: Past Medical History:  Diagnosis Date  . Allergy   . Arthritis    "knees, hips, back, hands" (11/08/2013)  . Asthma   . Basal cell carcinoma    "cut them off face & right shoulder" (11/08/2013)  . CAD (coronary artery disease), non obstructive by cardiac cath 12/12/13 01/24/2014  . CKD (chronic kidney disease), stage III   . Diastolic CHF ([place])   . DJD (degenerative joint disease)   . Gastric ulcer   . GERD (gastroesophageal reflux disease)   . Hiatal hernia   . History of steroid induced diabetes   . Hyperlipidemia    "don't take RX for it" (11/08/2013)  . Hypertension   . Hypothyroid   . Lung mass    superior segment LLL/notes 11/08/2013  . Lung nodule/ adenoca > LLobectomy 12/15/13  11/08/2013   PET 11/23/2013 1. Dominant sub solid left lower lobe nodule does not  demonstrate significantly increased metabolic activity. However, morphologically, adenocarcinoma is still a concern.   - Spirometry 11/24/13 wnl  - 11/24/2013 T surg eval rec> LLower lobectomy 12/15/2013 for adenoca   . Nephropathy   . Osteoporosis   . Pneumonia    "I've had it ?3 times" (11/08/2013)  . Renal artery stenosis (HCC)    with stent placement    ALLERGIES:  is allergic to benazepril hcl; loratadine; allegra [fexofenadine hcl]; celecoxib; fexofenadine; lisinopril; sulfa antibiotics; and sulfur.  MEDICATIONS:  Current Outpatient Prescriptions  Medication Sig Dispense Refill  . albuterol (PROVENTIL HFA;VENTOLIN HFA) 108 (90 BASE) MCG/ACT inhaler Inhale 1-2 puffs into the lungs every 6 (six) hours as needed for wheezing or shortness of breath. 18 g 1  . allopurinol (ZYLOPRIM) 100 MG tablet Take 1 tablet (100 mg total) by mouth 2 (two) times daily. Takes 2 tablets in am 180 tablet 0  . beclomethasone (QVAR) 80 MCG/ACT inhaler Inhale 2 puffs into the lungs 2 (two) times daily. 1 Inhaler 12  . Calcium Carb-Cholecalciferol 600-800 MG-UNIT TABS Take 2 tablets by mouth daily.     . cetirizine (ZYRTEC) 10 MG tablet Take 10 mg by mouth at bedtime.     . fluticasone (FLONASE) 50 MCG/ACT nasal spray USE TWO SPRAY(S) IN EACH NOSTRIL ONCE DAILY 16 g 2  . furosemide (LASIX) 40 MG tablet Take 40 mg by mouth 2 (two) times daily.    Marland Kitchen glucose blood (ONE TOUCH ULTRA TEST)  test strip Dx E09.9. Please use one strip each time sugars are checked. Pt tests 2 times daily. 100 each 6  . hydrALAZINE (APRESOLINE) 50 MG tablet Take 1 tablet (50 mg total) by mouth 2 (two) times daily. 180 tablet 1  . Lancets (ONETOUCH ULTRASOFT) lancets Use 1 per day dx code 249.00 100 each 12  . lansoprazole (PREVACID) 15 MG capsule Take 15 mg by mouth daily at 12 noon.    Marland Kitchen levothyroxine (SYNTHROID, LEVOTHROID) 88 MCG tablet TAKE ONE TABLET BY MOUTH ONCE DAILY BEFORE  BREAKFAST 90 tablet 0  . nebivolol (BYSTOLIC) 5 MG tablet Take  5 mg by mouth daily.    [name] DELICA LANCETS 41O MISC Use to check blood sugar once daily as instructed. Dx code: E11.9 100 each 2  . sertraline (ZOLOFT) 50 MG tablet TAKE ONE & ONE-HALF TABLETS BY MOUTH ONCE DAILY 126 tablet 0   No current facility-administered medications for this visit.     SURGICAL HISTORY:  Past Surgical History:  Procedure Laterality Date  . ANTERIOR CERVICAL DECOMP/DISCECTOMY FUSION  2000   C5-C6  . CARDIAC CATHETERIZATION     Hx: of 2/ 2015  . CHOLECYSTECTOMY  1970's  . CORONARY ANGIOGRAM     Hx: of 2/ 2015  . DILATION AND CURETTAGE OF UTERUS  1960's  . KNEE ARTHROSCOPY Bilateral   . LEFT HEART CATHETERIZATION WITH CORONARY ANGIOGRAM N/A 12/13/2013   Procedure: LEFT HEART CATHETERIZATION WITH CORONARY ANGIOGRAM;  Surgeon: Burnell Blanks, MD;  Location: [facility] CATH LAB;  Service: Cardiovascular;  Laterality: N/A;  . LOBECTOMY Left 12/15/2013   Procedure: LOBECTOMY;  Surgeon: Grace Isaac, MD;  Location: [facility];  Service: Thoracic;  Laterality: Left;  . PERCUTANEOUS STENT INTERVENTION Right 11/08/2013   Procedure: PERCUTANEOUS STENT INTERVENTION;  Surgeon: Lorretta Harp, MD;  Location: [facility] CATH LAB;  Service: Cardiovascular;  Laterality: Right;  rt renal artery stent  . RENAL ANGIOGRAM Bilateral 11/08/2013   Procedure: RENAL ANGIOGRAM;  Surgeon: Lorretta Harp, MD;  Location: [facility] CATH LAB;  Service: Cardiovascular;  Laterality: Bilateral;  . RENAL ARTERY STENT Right 11/08/2013   Archie Endo 11/08/2013  . SKIN CANCER EXCISION    . TONSILLECTOMY AND ADENOIDECTOMY  ?1946  . VAGINAL HYSTERECTOMY  1970  . VIDEO ASSISTED THORACOSCOPY (VATS)/WEDGE RESECTION Left 12/15/2013   Procedure: VIDEO ASSISTED THORACOSCOPY (VATS)/WEDGE RESECTION;  Surgeon: Grace Isaac, MD;  Location: [place] [facility];  Service: Thoracic;  Laterality: Left;  Marland Kitchen VIDEO BRONCHOSCOPY N/A 12/15/2013   Procedure: VIDEO BRONCHOSCOPY;  Surgeon: Grace Isaac, MD;  Location: [facility] OR;  Service: Thoracic;   Laterality: N/A;    REVIEW OF SYSTEMS:  A comprehensive review of systems was negative except for: Respiratory: positive for dyspnea on exertion Cardiovascular: positive for near-syncope   PHYSICAL EXAMINATION: General appearance: alert, cooperative and no distress Head: Normocephalic, without obvious abnormality, atraumatic Neck: no adenopathy, no JVD, supple, symmetrical, trachea midline and thyroid not enlarged, symmetric, no tenderness/mass/nodules Lymph nodes: Cervical, supraclavicular, and axillary nodes normal. Resp: clear to auscultation bilaterally Back: symmetric, no curvature. ROM normal. No CVA tenderness. Cardio: regular rate and rhythm, S1, S2 normal, no murmur, click, rub or gallop GI: soft, non-tender; bowel sounds normal; no masses,  no organomegaly Extremities: extremities normal, atraumatic, no cyanosis or edema  ECOG PERFORMANCE STATUS: 1 - Symptomatic but completely ambulatory  Blood pressure (!) 156/76, pulse (!) 54, temperature 97.7 F (36.5 C), temperature source Oral, resp. rate (!) 22, height '5\' 5"'$  (1.651 m), weight 236  lb 11.2 oz (107.4 kg), SpO2 99 %.  LABORATORY DATA: Lab Results  Component Value Date   WBC 8.6 03/05/2017   HGB 11.5 (L) 03/05/2017   HCT 37.0 03/05/2017   MCV 85.1 03/05/2017   PLT 302 03/05/2017      Chemistry      Component Value Date/Time   NA 144 03/05/2017 0744   K 4.7 03/05/2017 0744   CL 102 09/16/2016 0917   CO2 28 03/05/2017 0744   BUN 54.4 (H) 03/05/2017 0744   CREATININE 1.7 (H) 03/05/2017 0744      Component Value Date/Time   CALCIUM 9.9 03/05/2017 0744   ALKPHOS 98 03/05/2017 0744   AST 13 03/05/2017 0744   ALT 10 03/05/2017 0744   BILITOT 0.45 03/05/2017 0744       RADIOGRAPHIC STUDIES: Ct Chest Wo Contrast  Result Date: 03/05/2017 CLINICAL DATA:  76 year old female with history of left-sided lung cancer diagnosed in 2015 status post surgical resection (left lower lobectomy). Followup study. EXAM: CT CHEST  WITHOUT CONTRAST TECHNIQUE: Multidetector CT imaging of the chest was performed following the standard protocol without IV contrast. COMPARISON:  Chest CT 02/21/2016. FINDINGS: Cardiovascular: Heart size is borderline enlarged. There is no significant pericardial fluid, thickening or pericardial calcification. There is aortic atherosclerosis, as well as atherosclerosis of the great vessels of the mediastinum and the coronary arteries, including calcified atherosclerotic plaque in the left circumflex and right coronary arteries. Calcifications of the mitral annulus Mediastinum/Nodes: No pathologically enlarged mediastinal or hilar lymph nodes. Please note that accurate exclusion of hilar adenopathy is limited on noncontrast CT scans. Small hiatal hernia. No axillary lymphadenopathy. Lungs/Pleura: Status post left lower lobectomy. Compensatory hyperexpansion of the left upper lobe. No acute consolidative airspace disease. Several small pulmonary nodules are again noted throughout the lungs bilaterally, unchanged in size, number and distribution. The largest of these nodules include a 4 mm nodule in the posterior aspect of the right upper lobe abutting the major fissure (image 48 of series 5), and ground-glass attenuation nodules measuring up to 11 mm in the superior segment of the right lower lobe (image 66 of series 5). These nodules appears stable compared to baseline CT examination from 02/17/2014. No other suspicious appearing pulmonary nodules or masses are noted. No pleural effusions. Upper Abdomen: Aortic atherosclerosis.  Status post cholecystectomy. Musculoskeletal: There are no aggressive appearing lytic or blastic lesions noted in the visualized portions of the skeleton. IMPRESSION: 1. Status post left lower lobectomy, with no findings to suggest local recurrence of disease or definite metastatic disease in the thorax. 2. Stable pulmonary nodules dating back to 02/17/2014. The small solid-appearing nodules  are considered benign at this time. The sub solid nodules are also likely to be benign, but warrant continued attention until a full 5 years of stability has been documented. Accordingly, repeat noncontrast chest CT is recommended in 2 years. This recommendation follows the consensus statement: Guidelines for Management of Incidental Pulmonary Nodules Detected on CT Images: From the Fleischner Society 2017; Radiology 2017; 284:228-243. 3. Aortic atherosclerosis, in addition to 2 vessel coronary artery disease. Assessment for potential risk factor modification, dietary therapy or pharmacologic therapy may be warranted, if clinically indicated. Electronically Signed   By: Vinnie Langton M.D.   On: 03/05/2017 08:49   ASSESSMENT AND PLAN:  This is a very pleasant 76 years old white female with a stage IA non-small cell lung cancer, adenocarcinoma diagnosed in January 2015 status post left lower lobectomy with lymph node dissection. The patient  has been observation since that time and the recent scan showed no evidence for disease recurrence. I discussed the scan results with the patient and her husband and recommended for her to continue on observation with repeat CT scan of the chest in one year. For the coronary artery disease seen on the recent scan as well as the dizzy spells and hypertension, I strongly encouraged the patient to consult with her cardiologist for evaluation of her condition. For the renal insufficiency, she is followed by nephrology. The patient was advised to call immediately if she has any concerning symptoms in the interval. The patient voices understanding of current disease status and treatment options and is in agreement with the current care plan. All questions were answered. The patient knows to call the clinic with any problems, questions or concerns. We can certainly see the patient much sooner if necessary. I spent 10 minutes counseling the patient face to face. The total  time spent in the appointment was 15 minutes.  Disclaimer: This note was dictated with voice recognition software. Similar sounding words can inadvertently be transcribed and may not be corrected upon review.

## 2017-03-21 ENCOUNTER — Other Ambulatory Visit: Payer: Self-pay | Admitting: Family Medicine

## 2017-03-25 ENCOUNTER — Ambulatory Visit (INDEPENDENT_AMBULATORY_CARE_PROVIDER_SITE_OTHER): Payer: Medicare Other | Admitting: Cardiovascular Disease

## 2017-03-25 VITALS — BP 128/62 | HR 64 | Ht 65.0 in | Wt 236.8 lb

## 2017-03-25 DIAGNOSIS — I701 Atherosclerosis of renal artery: Secondary | ICD-10-CM

## 2017-03-25 DIAGNOSIS — I10 Essential (primary) hypertension: Secondary | ICD-10-CM | POA: Diagnosis not present

## 2017-03-25 DIAGNOSIS — R001 Bradycardia, unspecified: Secondary | ICD-10-CM | POA: Diagnosis not present

## 2017-03-25 DIAGNOSIS — R42 Dizziness and giddiness: Secondary | ICD-10-CM

## 2017-03-25 NOTE — Progress Notes (Signed)
Cardiology Office Note   Date:  03/25/2017   ID:  Susan Pittman, DOB April 18, 1941, MRN [id number]  PCP:  [name], MD  Cardiologist:   Kathlyn Sacramento, MD   Chief Complaint  Patient presents with  . Follow-up    pt said she had dizziness last week ,       History of Present Illness: Susan Pittman is a 76 y.o. female who presents for a follow up visit regarding history of renal artery stenting.  She has a long-standing history of hypertension  hypothyroidism, remote peptic ulcer disease in the 1980s, as well as GERD.  She had right renal artery stenting in January 2015. She is not to have chronic kidney disease due to IgA nephropathy. Previous cardiac catheterization in 2015 showed  mild nonobstructive CAD with smooth 20% RCA narrowing.  Ejection fraction was 65%.   She was diagnosed with stage I lung cancer in 2015 and underwent  mini thoracotomy with lobectomy of the left lower lobe with lymph node dissection for adenocarcinoma.  She was seen recently for dizziness and shortness of breath. She was noted to be mildly bradycardic. The dose of Bystolic was cut in half some improvement in her symptoms. She had an echocardiogram done which showed normal LV systolic function, mild mitral regurgitation, severely dilated left atrium and trivial posterior pericardial effusion. She reports dizziness described as lightheadedness without vertigo. This happens  in the standing position. She has no orthostatic symptoms. She denies vertigo. She reports that the symptoms have been chronic for at least one or 2 years. She also reports worsening exertional dyspnea but she is able to exercise on a stationary bike. No syncope.   Past Medical History:  Diagnosis Date  . Allergy   . Arthritis    "knees, hips, back, hands" (11/08/2013)  . Asthma   . Basal cell carcinoma    "cut them off face & right shoulder" (11/08/2013)  . CAD (coronary artery disease), non obstructive by cardiac cath 12/12/13  01/24/2014  . CKD (chronic kidney disease), stage III   . Diastolic CHF ([place])   . DJD (degenerative joint disease)   . Gastric ulcer   . GERD (gastroesophageal reflux disease)   . Hiatal hernia   . History of steroid induced diabetes   . Hyperlipidemia    "don't take RX for it" (11/08/2013)  . Hypertension   . Hypothyroid   . Lung mass    superior segment LLL/notes 11/08/2013  . Lung nodule/ adenoca > LLobectomy 12/15/13  11/08/2013   PET 11/23/2013 1. Dominant sub solid left lower lobe nodule does not demonstrate significantly increased metabolic activity. However, morphologically, adenocarcinoma is still a concern.   - Spirometry 11/24/13 wnl  - 11/24/2013 T surg eval rec> LLower lobectomy 12/15/2013 for adenoca   . Nephropathy   . Osteoporosis   . Pneumonia    "I've had it ?3 times" (11/08/2013)  . Renal artery stenosis ([place])    with stent placement    Past Surgical History:  Procedure Laterality Date  . ANTERIOR CERVICAL DECOMP/DISCECTOMY FUSION  2000   C5-C6  . CARDIAC CATHETERIZATION     Hx: of 2/ 2015  . CHOLECYSTECTOMY  1970's  . CORONARY ANGIOGRAM     Hx: of 2/ 2015  . DILATION AND CURETTAGE OF UTERUS  1960's  . KNEE ARTHROSCOPY Bilateral   . LEFT HEART CATHETERIZATION WITH CORONARY ANGIOGRAM N/A 12/13/2013   Procedure: LEFT HEART CATHETERIZATION WITH CORONARY ANGIOGRAM;  Surgeon: Harrell Gave  Santina Evans, MD;  Location: [facility] CATH LAB;  Service: Cardiovascular;  Laterality: N/A;  . LOBECTOMY Left 12/15/2013   Procedure: LOBECTOMY;  Surgeon: Grace Isaac, MD;  Location: [place] [place];  Service: Thoracic;  Laterality: Left;  . PERCUTANEOUS STENT INTERVENTION Right 11/08/2013   Procedure: PERCUTANEOUS STENT INTERVENTION;  Surgeon: Lorretta Harp, MD;  Location: [facility] CATH LAB;  Service: Cardiovascular;  Laterality: Right;  rt renal artery stent  . RENAL ANGIOGRAM Bilateral 11/08/2013   Procedure: RENAL ANGIOGRAM;  Surgeon: Lorretta Harp, MD;  Location: [facility] CATH LAB;  Service: Cardiovascular;   Laterality: Bilateral;  . RENAL ARTERY STENT Right 11/08/2013   Archie Endo 11/08/2013  . SKIN CANCER EXCISION    . TONSILLECTOMY AND ADENOIDECTOMY  ?1946  . VAGINAL HYSTERECTOMY  1970  . VIDEO ASSISTED THORACOSCOPY (VATS)/WEDGE RESECTION Left 12/15/2013   Procedure: VIDEO ASSISTED THORACOSCOPY (VATS)/WEDGE RESECTION;  Surgeon: Grace Isaac, MD;  Location: [facility];  Service: Thoracic;  Laterality: Left;  Marland Kitchen VIDEO BRONCHOSCOPY N/A 12/15/2013   Procedure: VIDEO BRONCHOSCOPY;  Surgeon: Grace Isaac, MD;  Location: [facility] OR;  Service: Thoracic;  Laterality: N/A;     Current Outpatient Prescriptions  Medication Sig Dispense Refill  . albuterol (PROVENTIL HFA;VENTOLIN HFA) 108 (90 BASE) MCG/ACT inhaler Inhale 1-2 puffs into the lungs every 6 (six) hours as needed for wheezing or shortness of breath. 18 g 1  . allopurinol (ZYLOPRIM) 100 MG tablet Take 1 tablet (100 mg total) by mouth 2 (two) times daily. Takes 2 tablets in am 180 tablet 0  . Calcium Carb-Cholecalciferol 600-800 MG-UNIT TABS Take 2 tablets by mouth daily.     . cetirizine (ZYRTEC) 10 MG tablet Take 10 mg by mouth at bedtime.     . fluticasone (FLONASE) 50 MCG/ACT nasal spray USE TWO SPRAY(S) IN EACH NOSTRIL ONCE DAILY 16 g 2  . furosemide (LASIX) 40 MG tablet Take 40 mg by mouth 2 (two) times daily.    Marland Kitchen glucose blood (ONE TOUCH ULTRA TEST) test strip Dx E09.9. Please use one strip each time sugars are checked. Pt tests 2 times daily. 100 each 6  . hydrALAZINE (APRESOLINE) 50 MG tablet Take 1 tablet (50 mg total) by mouth 2 (two) times daily. 180 tablet 1  . Lancets (ONETOUCH ULTRASOFT) lancets Use 1 per day dx code 249.00 100 each 12  . lansoprazole (PREVACID) 15 MG capsule Take 15 mg by mouth daily at 12 noon.    Marland Kitchen levothyroxine (SYNTHROID, LEVOTHROID) 88 MCG tablet TAKE ONE TABLET BY MOUTH ONE TIME DAILY BEFORE BREAKFAST 90 tablet 0  . nebivolol (BYSTOLIC) 5 MG tablet Take 5 mg by mouth daily.    [name] DELICA LANCETS 27O MISC Use  to check blood sugar once daily as instructed. Dx code: E11.9 100 each 2  . sertraline (ZOLOFT) 50 MG tablet TAKE ONE AND ONE-HALF TABLETS BY MOUTH ONE TIME DAILY  126 tablet 0   No current facility-administered medications for this visit.     Allergies:   Benazepril hcl; Loratadine; Allegra [fexofenadine hcl]; Celecoxib; Fexofenadine; Lisinopril; Sulfa antibiotics; and Sulfur    Social History:  The patient  reports that she has never smoked. She has never used smokeless tobacco. She reports that she does not drink alcohol or use drugs.   Family History:  The patient's family history includes Allergies in her brother and sister; Breast cancer in her sister; Diabetes in her brother and father; Emphysema in her father; Heart disease in her father and mother; Stroke  in her father.    ROS:  Please see the history of present illness.   Otherwise, review of systems are positive for none.   All other systems are reviewed and negative.    PHYSICAL EXAM: VS:  BP 128/62   Pulse 64   Ht '5\' 5"'$  (1.651 m)   Wt 236 lb 12.8 oz (107.4 kg)   BMI 39.41 kg/m  , BMI Body mass index is 39.41 kg/m. GEN: Well nourished, well developed, in no acute distress  HEENT: normal  Neck: no JVD, carotid bruits, or masses Cardiac: RRR; no rubs, or gallops,no edema . It is 1/6 systolic murmur in the aortic area Respiratory:  clear to auscultation bilaterally, normal work of breathing GI: soft, nontender, nondistended, + BS MS: no deformity or atrophy  Skin: warm and dry, no rash Neuro:  Strength and sensation are intact Psych: euthymic mood, full affect   EKG:  EKG is not ordered today.    Recent Labs: 12/25/2016: Pro B Natriuretic peptide (BNP) 224.0; TSH 1.23 03/05/2017: ALT 10; BUN 54.4; Creatinine 1.7; HGB 11.5; Platelets 302; Potassium 4.7; Sodium 144    Lipid Panel    Component Value Date/Time   CHOL 213 (H) 09/16/2016 0917   TRIG 156.0 (H) 09/16/2016 0917   HDL 48.10 09/16/2016 0917   CHOLHDL 4  09/16/2016 0917   VLDL 31.2 09/16/2016 0917   LDLCALC 134 (H) 09/16/2016 0917   LDLDIRECT 126.3 09/10/2012 1537      Wt Readings from Last 3 Encounters:  03/25/17 236 lb 12.8 oz (107.4 kg)  03/06/17 236 lb 11.2 oz (107.4 kg)  01/15/17 237 lb (107.5 kg)        ASSESSMENT AND PLAN:  1.  Dizziness of unclear etiology: During these episodes, she reported her blood pressure and heart rate. Heart rate was in the 50s and blood pressure was within normal range. The etiology is not entirely clear. Bradycardia might be contributing. Thus, I requested a 48-hour Holter monitor.  2. Status post right renal artery stenting: Duplex  last year showed patent stent with no significant disease.    2. Essential hypertension: Blood pressure is controlled on current medications.   Disposition:   FU with me in 6 months  Signed,  Kathlyn Sacramento, MD  03/25/2017 11:35 AM    [place]

## 2017-03-25 NOTE — Patient Instructions (Signed)
Medication Instructions:  Your physician recommends that you continue on your current medications as directed. Please refer to the Current Medication list given to you today.  Labwork: No new orders.   Testing/Procedures: Your physician has recommended that you wear a 48 hour holter monitor. Holter monitors are medical devices that record the heart's electrical activity. Doctors most often use these monitors to diagnose arrhythmias. Arrhythmias are problems with the speed or rhythm of the heartbeat. The monitor is a small, portable device. You can wear one while you do your normal daily activities. This is usually used to diagnose what is causing palpitations/syncope (passing out).  Follow-Up: Your physician wants you to follow-up in: 6 MONTHS with Dr Fletcher Anon. You will receive a reminder letter in the mail two months in advance. If you don't receive a letter, please call our office to schedule the follow-up appointment.   Any Other Special Instructions Will Be Listed Below (If Applicable).     If you need a refill on your cardiac medications before your next appointment, please call your pharmacy.

## 2017-03-26 ENCOUNTER — Ambulatory Visit (INDEPENDENT_AMBULATORY_CARE_PROVIDER_SITE_OTHER): Payer: Medicare Other | Admitting: Family Medicine

## 2017-03-26 ENCOUNTER — Encounter: Payer: Self-pay | Admitting: Family Medicine

## 2017-03-26 VITALS — BP 136/72 | HR 58 | Temp 97.6°F | Resp 18 | Ht 65.0 in | Wt 237.8 lb

## 2017-03-26 DIAGNOSIS — N3946 Mixed incontinence: Secondary | ICD-10-CM | POA: Diagnosis not present

## 2017-03-26 DIAGNOSIS — M810 Age-related osteoporosis without current pathological fracture: Secondary | ICD-10-CM | POA: Diagnosis not present

## 2017-03-26 DIAGNOSIS — I10 Essential (primary) hypertension: Secondary | ICD-10-CM | POA: Diagnosis not present

## 2017-03-26 DIAGNOSIS — E785 Hyperlipidemia, unspecified: Secondary | ICD-10-CM

## 2017-03-26 DIAGNOSIS — F32A Depression, unspecified: Secondary | ICD-10-CM | POA: Insufficient documentation

## 2017-03-26 DIAGNOSIS — R7989 Other specified abnormal findings of blood chemistry: Secondary | ICD-10-CM

## 2017-03-26 DIAGNOSIS — R350 Frequency of micturition: Secondary | ICD-10-CM | POA: Diagnosis not present

## 2017-03-26 DIAGNOSIS — F329 Major depressive disorder, single episode, unspecified: Secondary | ICD-10-CM

## 2017-03-26 DIAGNOSIS — R351 Nocturia: Secondary | ICD-10-CM | POA: Diagnosis not present

## 2017-03-26 DIAGNOSIS — I701 Atherosclerosis of renal artery: Secondary | ICD-10-CM

## 2017-03-26 DIAGNOSIS — Z0000 Encounter for general adult medical examination without abnormal findings: Secondary | ICD-10-CM

## 2017-03-26 DIAGNOSIS — E039 Hypothyroidism, unspecified: Secondary | ICD-10-CM

## 2017-03-26 DIAGNOSIS — F419 Anxiety disorder, unspecified: Secondary | ICD-10-CM

## 2017-03-26 LAB — CBC WITH DIFFERENTIAL/PLATELET
Basophils Absolute: 0.1 10*3/uL (ref 0.0–0.1)
Basophils Relative: 0.9 % (ref 0.0–3.0)
Eosinophils Absolute: 0.1 10*3/uL (ref 0.0–0.7)
Eosinophils Relative: 1.5 % (ref 0.0–5.0)
HEMATOCRIT: 37 % (ref 36.0–46.0)
HEMOGLOBIN: 11.7 g/dL — AB (ref 12.0–15.0)
LYMPHS PCT: 18.2 % (ref 12.0–46.0)
Lymphs Abs: 1.6 10*3/uL (ref 0.7–4.0)
MCHC: 31.6 g/dL (ref 30.0–36.0)
MCV: 83 fl (ref 78.0–100.0)
MONOS PCT: 5.6 % (ref 3.0–12.0)
Monocytes Absolute: 0.5 10*3/uL (ref 0.1–1.0)
Neutro Abs: 6.7 10*3/uL (ref 1.4–7.7)
Neutrophils Relative %: 73.8 % (ref 43.0–77.0)
Platelets: 319 10*3/uL (ref 150.0–400.0)
RBC: 4.46 Mil/uL (ref 3.87–5.11)
RDW: 17.4 % — AB (ref 11.5–15.5)
WBC: 9 10*3/uL (ref 4.0–10.5)

## 2017-03-26 LAB — BASIC METABOLIC PANEL
BUN: 54 mg/dL — ABNORMAL HIGH (ref 6–23)
CALCIUM: 9.7 mg/dL (ref 8.4–10.5)
CO2: 30 meq/L (ref 19–32)
Chloride: 103 mEq/L (ref 96–112)
Creatinine, Ser: 1.65 mg/dL — ABNORMAL HIGH (ref 0.40–1.20)
GFR: 31.95 mL/min — AB (ref >60.00)
GLUCOSE: 93 mg/dL (ref 70–99)
Potassium: 4.5 mEq/L (ref 3.5–5.1)
SODIUM: 141 meq/L (ref 135–145)

## 2017-03-26 LAB — LIPID PANEL
CHOLESTEROL: 202 mg/dL — AB (ref 0–200)
HDL: 47.4 mg/dL (ref >39.00)
NonHDL: 154.78
TRIGLYCERIDES: 202 mg/dL — AB (ref 0.0–149.0)
Total CHOL/HDL Ratio: 4
VLDL: 40.4 mg/dL — ABNORMAL HIGH (ref 0.0–40.0)

## 2017-03-26 LAB — HEPATIC FUNCTION PANEL
ALK PHOS: 103 U/L (ref 39–117)
ALT: 8 U/L (ref 0–35)
AST: 8 U/L (ref 0–37)
Albumin: 4.4 g/dL (ref 3.5–5.2)
BILIRUBIN DIRECT: 0.1 mg/dL (ref 0.0–0.3)
TOTAL PROTEIN: 6.7 g/dL (ref 6.0–8.3)
Total Bilirubin: 0.4 mg/dL (ref 0.2–1.2)

## 2017-03-26 LAB — TSH: TSH: 1.15 u[IU]/mL (ref 0.35–4.50)

## 2017-03-26 LAB — LDL CHOLESTEROL, DIRECT: LDL DIRECT: 120 mg/dL

## 2017-03-26 LAB — VITAMIN D 25 HYDROXY (VIT D DEFICIENCY, FRACTURES): VITD: 40.02 ng/mL (ref 30.00–100.00)

## 2017-03-26 NOTE — Progress Notes (Signed)
   Subjective:    Patient ID: Susan Pittman, female    DOB: 26-Aug-1941, 76 y.o.   MRN: [id number]  HPI HTN- chronic problem, well controlled today.  On Lasix, Hydralazine, Bystolic daily.  No CP, SOB, HAs, visual changes, edema.  + light headedness- cards is doing monitor upcoming.  Pt is interested in decreasing lasix due to improvement in swelling.  Hyperlipidemia- chronic problem, not currently on a statin.  Denies abd pain, N/V, myalgias.  Hypothyroid- chronic problem, on Levothyroxine 22mcg daily.  + fatigue.  Denies changes to skin/hair/nails.  Osteoporosis- chronic problem, due for repeat DEXA.  Pt states this is scheduled.  Due for Vit D level    Anxiety/depression- chronic problem, on Zoloft 50mg  daily.  Pt is interested in weaning off medication b/c she doesn't feel it's very effective or making much difference.  Is now able to use breathing exercises to control anxiety.     Review of Systems For ROS see HPI     Objective:   Physical Exam  Constitutional: She is oriented to person, place, and time. She appears well-developed and well-nourished. No distress.  HENT:  Head: Normocephalic and atraumatic.  Eyes: Conjunctivae and EOM are normal. Pupils are equal, round, and reactive to light.  Neck: Normal range of motion. Neck supple. No thyromegaly present.  Cardiovascular: Normal rate, regular rhythm, normal heart sounds and intact distal pulses.   No murmur heard. Pulmonary/Chest: Effort normal and breath sounds normal. No respiratory distress.  Abdominal: Soft. She exhibits no distension. There is no tenderness.  Musculoskeletal: She exhibits no edema.  Lymphadenopathy:    She has no cervical adenopathy.  Neurological: She is alert and oriented to person, place, and time.  Skin: Skin is warm and dry.  Psychiatric: She has a normal mood and affect. Her behavior is normal.  Vitals reviewed.         Assessment & Plan:

## 2017-03-26 NOTE — Assessment & Plan Note (Signed)
Chronic problem.  Not currently on a statin.  Attempting to control w/ diet and exercise.  Check labs.  Start meds prn.

## 2017-03-26 NOTE — Assessment & Plan Note (Signed)
Chronic problem.  Pt reports sxs are better than previously and would like to wean off Zoloft.  Weaning plan outlined for pt.  Will follow.

## 2017-03-26 NOTE — Assessment & Plan Note (Signed)
Chronic problem.  Adequate control today.  Asymptomatic w/ exception of dizziness which cards is evaluating.  Check labs.  No anticipated med changes.  Will follow closely.

## 2017-03-26 NOTE — Progress Notes (Signed)
Subjective:   Susan Pittman is a 76 y.o. female who presents for Medicare Annual (Subsequent) preventive examination.  Review of Systems:  No ROS.  Medicare Wellness Visit.  Cardiac Risk Factors include: advanced age (>4men, >74 women);dyslipidemia;hypertension;obesity (BMI >30kg/m2);family history of premature cardiovascular disease   Sleep patterns: Sleeps 9 hours, up to void x 3-5. Followed by Urology for frequency.  Home Safety/Smoke Alarms: Smoke detectors and security in place.   Living environment; residence and Firearm Safety: Lives with husband in 1 story home, steps at door without rail. Feels safe in home. No firearms. Seat Belt Safety/Bike Helmet: Wears seat belt.   Counseling:   Eye Exam-Last exam 07/2016, yearly Dr. Earlie Raveling exam 02/2017, every 6 months Dr. Luana Shu  Female:   Pap-N/A      Mammo-03/25/2016, negative. Scheduled for  03/28/2017      Dexa scan-03/02/2015, Osteoporosis. Scheduled for  03/28/2017        CCS-Colonoscopy > 5 years, pt reports polyps. Cologuard ordered.      Objective:     Vitals: BP 136/72 (BP Location: Right Arm, Patient Position: Sitting, Cuff Size: Normal)   Pulse (!) 58   Temp 97.6 F (36.4 C)   Resp 18   Ht 5\' 5"  (1.651 m)   Wt 237 lb 12.8 oz (107.9 kg)   SpO2 98%   BMI 39.57 kg/m   Body mass index is 39.57 kg/m.   Tobacco History  Smoking Status  . Never Smoker  Smokeless Tobacco  . Never Used     Counseling given: Not Answered   Past Medical History:  Diagnosis Date  . Allergy   . Arthritis    "knees, hips, back, hands" (11/08/2013)  . Asthma   . Basal cell carcinoma    "cut them off face & right shoulder" (11/08/2013)  . CAD (coronary artery disease), non obstructive by cardiac cath 12/12/13 01/24/2014  . CKD (chronic kidney disease), stage III   . Diastolic CHF ([place])   . DJD (degenerative joint disease)   . Gastric ulcer   . GERD (gastroesophageal reflux disease)   . Hiatal hernia   . History of  steroid induced diabetes   . Hyperlipidemia    "don't take RX for it" (11/08/2013)  . Hypertension   . Hypothyroid   . Lung mass    superior segment LLL/notes 11/08/2013  . Lung nodule/ adenoca > LLobectomy 12/15/13  11/08/2013   PET 11/23/2013 1. Dominant sub solid left lower lobe nodule does not demonstrate significantly increased metabolic activity. However, morphologically, adenocarcinoma is still a concern.   - Spirometry 11/24/13 wnl  - 11/24/2013 T surg eval rec> LLower lobectomy 12/15/2013 for adenoca   . Nephropathy   . Osteoporosis   . Pneumonia    "I've had it ?3 times" (11/08/2013)  . Renal artery stenosis ([place])    with stent placement   Past Surgical History:  Procedure Laterality Date  . ANTERIOR CERVICAL DECOMP/DISCECTOMY FUSION  2000   C5-C6  . CARDIAC CATHETERIZATION     Hx: of 2/ 2015  . CHOLECYSTECTOMY  1970's  . CORONARY ANGIOGRAM     Hx: of 2/ 2015  . DILATION AND CURETTAGE OF UTERUS  1960's  . KNEE ARTHROSCOPY Bilateral   . LEFT HEART CATHETERIZATION WITH CORONARY ANGIOGRAM N/A 12/13/2013   Procedure: LEFT HEART CATHETERIZATION WITH CORONARY ANGIOGRAM;  Surgeon: Burnell Blanks, MD;  Location: [facility] CATH LAB;  Service: Cardiovascular;  Laterality: N/A;  . LOBECTOMY Left 12/15/2013   Procedure:  LOBECTOMY;  Surgeon: Grace Isaac, MD;  Location: [facility];  Service: Thoracic;  Laterality: Left;  . PERCUTANEOUS STENT INTERVENTION Right 11/08/2013   Procedure: PERCUTANEOUS STENT INTERVENTION;  Surgeon: Lorretta Harp, MD;  Location: [facility] CATH LAB;  Service: Cardiovascular;  Laterality: Right;  rt renal artery stent  . RENAL ANGIOGRAM Bilateral 11/08/2013   Procedure: RENAL ANGIOGRAM;  Surgeon: Lorretta Harp, MD;  Location[facility] CATH LAB;  Service: Cardiovascular;  Laterality: Bilateral;  . RENAL ARTERY STENT Right 11/08/2013   Archie Endo 11/08/2013  . SKIN CANCER EXCISION    . TONSILLECTOMY AND ADENOIDECTOMY  ?1946  . VAGINAL HYSTERECTOMY  1970  . VIDEO ASSISTED THORACOSCOPY  (VATS)/WEDGE RESECTION Left 12/15/2013   Procedure: VIDEO ASSISTED THORACOSCOPY (VATS)/WEDGE RESECTION;  Surgeon: Grace Isaac, MD;  Location: [place];  Service: Thoracic;  Laterality: Left;  Marland Kitchen VIDEO BRONCHOSCOPY N/A 12/15/2013   Procedure: VIDEO BRONCHOSCOPY;  Surgeon: Grace Isaac, MD;  Location: [facility] OR;  Service: Thoracic;  Laterality: N/A;   Family History  Problem Relation Age of Onset  . Breast cancer Sister   . Heart disease Mother   . Emphysema Father        smoked  . Heart disease Father   . Diabetes Father   . Stroke Father   . Thyroid disease Sister   . Allergies Sister   . Allergies Brother   . Diabetes Brother   . Heart failure Neg Hx    History  Sexual Activity  . Sexual activity: Yes    Outpatient Encounter Prescriptions as of 03/26/2017  Medication Sig  . acetaminophen (TYLENOL) 500 MG tablet Take 500 mg by mouth 3 (three) times daily.  Marland Kitchen albuterol (PROVENTIL HFA;VENTOLIN HFA) 108 (90 BASE) MCG/ACT inhaler Inhale 1-2 puffs into the lungs every 6 (six) hours as needed for wheezing or shortness of breath.  . allopurinol (ZYLOPRIM) 100 MG tablet Take 1 tablet (100 mg total) by mouth 2 (two) times daily. Takes 2 tablets in am  . Calcium Carb-Cholecalciferol 600-800 MG-UNIT TABS Take 2 tablets by mouth daily.   . cetirizine (ZYRTEC) 10 MG tablet Take 10 mg by mouth at bedtime.   . Flaxseed, Linseed, (FLAX SEEDS PO) Take by mouth.  . fluticasone (FLONASE) 50 MCG/ACT nasal spray USE TWO SPRAY(S) IN EACH NOSTRIL ONCE DAILY  . furosemide (LASIX) 40 MG tablet Take 40 mg by mouth 2 (two) times daily.  Marland Kitchen glucose blood (ONE TOUCH ULTRA TEST) test strip Dx E09.9. Please use one strip each time sugars are checked. Pt tests 2 times daily.  . hydrALAZINE (APRESOLINE) 50 MG tablet Take 1 tablet (50 mg total) by mouth 2 (two) times daily.  . Lancets (ONETOUCH ULTRASOFT) lancets Use 1 per day dx code 249.00  . lansoprazole (PREVACID) 15 MG capsule Take 15 mg by mouth daily at 12  noon.  Marland Kitchen levothyroxine (SYNTHROID, LEVOTHROID) 88 MCG tablet TAKE ONE TABLET BY MOUTH ONE TIME DAILY BEFORE BREAKFAST  . nebivolol (BYSTOLIC) 5 MG tablet Take 10 mg by mouth daily.   [name] DELICA LANCETS 50K MISC Use to check blood sugar once daily as instructed. Dx code: E11.9  . sertraline (ZOLOFT) 50 MG tablet TAKE ONE AND ONE-HALF TABLETS BY MOUTH ONE TIME DAILY    No facility-administered encounter medications on file as of 03/26/2017.     Activities of Daily Living In your present state of health, do you have any difficulty performing the following activities: 03/26/2017  Hearing? Y  Vision? N  Difficulty  concentrating or making decisions? N  Walking or climbing stairs? N  Dressing or bathing? N  Doing errands, shopping? N  Preparing Food and eating ? N  Using the Toilet? N  In the past six months, have you accidently leaked urine? Y  Do you have problems with loss of bowel control? N  Managing your Medications? N  Managing your Finances? N  Housekeeping or managing your Housekeeping? N  Some recent data might be hidden    Patient Care Team: [name], MD as PCP - General Grace Isaac, MD as Consulting Physician (Cardiothoracic Surgery) Tanda Rockers, MD as Consulting Physician (Pulmonary Disease) Jamal Maes, MD as Consulting Physician (Nephrology) Wellington Hampshire, MD as Consulting Physician (Cardiology) [place], Jennefer Bravo, MD as Referring Physician (Dermatology) Dene Gentry, MD as Consulting Physician (Sports Medicine)    Assessment:    Physical assessment deferred to PCP.  Exercise Activities and Dietary recommendations Current Exercise Habits: Structured exercise class, Type of exercise: yoga (walking in water, bike, chair yoga), Time (Minutes): 45, Frequency (Times/Week): 5, Weekly Exercise (Minutes/Week): 225, Exercise limited by: respiratory conditions(s) (SOB with exertion)   Diet (meal preparation, eat out, water intake,  caffeinated beverages, dairy products, fruits and vegetables): Drinks water and occasional diet soda, wine.   Breakfast: oatmeal, peanut butter, flax seeds Lunch: hamburger, fish, vegetables, fruit Dinner: half sandwich  Discussed heart healthy diet. Encouraged to continue activities.   Goals      Patient Stated   . <enter goal here> (pt-stated)          "To be able to do more, active" by following cardiology advisements.       Fall Risk Fall Risk  03/26/2017 03/18/2016 01/10/2016 11/30/2015 11/24/2014  Falls in the past year? No No No No No  Risk for fall due to : - - - - -  Risk for fall due to (comments): - - - - -   Depression Screen PHQ 2/9 Scores 03/26/2017 03/18/2016 01/10/2016 11/30/2015  PHQ - 2 Score 0 0 0 0  Exception Documentation - - - Patient refusal     Cognitive Function       Ad8 score reviewed for issues:  Issues making decisions: no  Less interest in hobbies / activities: no  Repeats questions, stories (family complaining): no  Trouble using ordinary gadgets (microwave, computer, phone): no  Forgets the month or year: no  Mismanaging finances: no  Remembering appts: no  Daily problems with thinking and/or memory:no Ad8 score is=0     Immunization History  Administered Date(s) Administered  . Influenza Split 07/25/2011, 08/20/2013  . Influenza Whole 07/28/2010  . Influenza, High Dose Seasonal PF 08/23/2014, 07/19/2016  . Influenza,inj,Quad PF,36+ Mos 09/07/2015  . Pneumococcal Conjugate-13 02/22/2015  . Pneumococcal Polysaccharide-23 03/18/2016  . Td 01/23/2007   Screening Tests Health Maintenance  Topic Date Due  . TETANUS/TDAP  01/22/2017  . MAMMOGRAM  03/25/2017  . INFLUENZA VACCINE  06/04/2017  . DEXA SCAN  Completed  . PNA vac Low Risk Adult  Completed   Mammogram and DEXA scheduled for 03/28/2017.     Plan:    Bring a copy of your advance directives to your next office visit.  Continue doing brain stimulating activities  (puzzles, reading, adult coloring books, staying active) to keep memory sharp.   Continue to eat heart healthy diet (full of fruits, vegetables, whole grains, lean protein, water--limit salt, fat, and sugar intake) and increase physical activity as tolerated.  Complete Cologuard  I have personally reviewed and noted the following in the patient's chart:   . Medical and social history . Use of alcohol, tobacco or illicit drugs  . Current medications and supplements . Functional ability and status . Nutritional status . Physical activity . Advanced directives . List of other physicians . Hospitalizations, surgeries, and ER visits in previous 12 months . Vitals . Screenings to include cognitive, depression, and falls . Referrals and appointments  In addition, I have reviewed and discussed with patient certain preventive protocols, quality metrics, and best practice recommendations. A written personalized care plan for preventive services as well as general preventive health recommendations were provided to patient.     Gerilyn Nestle, RN  03/26/2017  PCP Note: -Mammo and DEXA scheduled for 03/28/2017 -Failed hearing screen (left ear), declines audiology referral -Would like to discuss discontinuing Zoloft and decreasing Lasix. -48 hr heart monitor planned for this month -Urodynamic Study scheduled for next month -Cologuard ordered.  Reviewed documentation and agree w/ above.  [name], MD

## 2017-03-26 NOTE — Patient Instructions (Addendum)
Follow up in 6 months to recheck BP and cholesterol We'll notify you of your lab results and make any changes if needed Continue to work on healthy diet and regular exercise- you're looking great! Decrease the Sertraline (Zoloft) to 29m daily (1/2 tab) x2-4 weeks and then decrease to 1/2 tab every other day x2 weeks and then stop Decrease Lasix to once daily Call with any questions or concerns [facility]!!!  Bring a copy of your advance directives to your next office visit.  Continue doing brain stimulating activities (puzzles, reading, adult coloring books, staying active) to keep memory sharp.   Continue to eat heart healthy diet (full of fruits, vegetables, whole grains, lean protein, water--limit salt, fat, and sugar intake) and increase physical activity as tolerated.  Complete Cologuard   Health Maintenance, Female Adopting a healthy lifestyle and getting preventive care can go a long way to promote health and wellness. Talk with your health care provider about what schedule of regular examinations is right for you. This is a good chance for you to check in with your provider about disease prevention and staying healthy. In between checkups, there are plenty of things you can do on your own. Experts have done a lot of research about which lifestyle changes and preventive measures are most likely to keep you healthy. Ask your health care provider for more information. Weight and diet Eat a healthy diet  Be sure to include plenty of vegetables, fruits, low-fat dairy products, and lean protein.  Do not eat a lot of foods high in solid fats, added sugars, or salt.  Get regular exercise. This is one of the most important things you can do for your health.  Most adults should exercise for at least 150 minutes each week. The exercise should increase your heart rate and make you sweat (moderate-intensity exercise).  Most adults should also do strengthening exercises at least  twice a week. This is in addition to the moderate-intensity exercise. Maintain a healthy weight  Body mass index (BMI) is a measurement that can be used to identify possible weight problems. It estimates body fat based on height and weight. Your health care provider can help determine your BMI and help you achieve or maintain a healthy weight.  For females 78years of age and older:  A BMI below 18.5 is considered underweight.  A BMI of 18.5 to 24.9 is normal.  A BMI of 25 to 29.9 is considered overweight.  A BMI of 30 and above is considered obese. Watch levels of cholesterol and blood lipids  You should start having your blood tested for lipids and cholesterol at 78years of age, then have this test every 5 years. tested for lipids and cholesterol at 78years of age, then have this test every 5 years.  You may need to have your cholesterol levels checked more often if:  Your lipid or cholesterol levels are high.  You are older than 78years of age. of age.  You are at high risk for heart disease. Cancer screening Lung Cancer  Lung cancer screening is recommended for adults 78years old who are at high risk for lung cancer because of a history of smoking. who are at high risk for lung cancer because of a history of smoking.  A yearly low-dose CT scan of the lungs is recommended for people who:  Currently smoke.  Have quit within the past 15 years.  Have at least a 30-pack-year history of smoking. A pack year is smoking an average of one pack of cigarettes a day for 1 year.  Yearly screening should continue until it has been 15 years since you quit.  Yearly screening  should stop if you develop a health problem that would prevent you from having lung cancer treatment. Breast Cancer  Practice breast self-awareness. This means understanding how your breasts normally appear and feel.  It also means doing regular breast self-exams. Let your health care provider know about any changes, no matter how small.  If you are 78 or older, you should have a clinical breast exam (CBE) by a health care provider every 1-3 years as part of a  regular health exam. you should have a clinical breast exam (CBE) by a health care provider every 1-3 years as part of a  regular health exam.  If you are 78 or older, have a CBE every year. have a CBE every year. Also consider having a breast X-ray (mammogram) every year.  If you have a family history of breast cancer, talk to your health care provider about genetic screening.  If you are at high risk for breast cancer, talk to your health care provider about having an MRI and a mammogram every year.  Breast cancer gene (BRCA) assessment is recommended for women who have family members with BRCA-related cancers. BRCA-related cancers include:  Breast.  Ovarian.  Tubal.  Peritoneal cancers.  Results of the assessment will determine the need for genetic counseling and BRCA1 and BRCA2 testing. Cervical Cancer  Your health care provider may recommend that you be screened regularly for cancer of the pelvic organs (ovaries, uterus, and vagina). This screening involves a pelvic examination, including checking for microscopic changes to the surface of your cervix (Pap test). You may be encouraged to have this screening done every 3 years, beginning at age 78.  For women ages 87-65, health care providers may recommend pelvic exams and Pap testing every 3 years, or they may recommend the Pap and pelvic exam, combined with testing for human papilloma virus (HPV), every 5 years. Some types of HPV increase your risk of cervical cancer. Testing for HPV may also be done on women of any age with unclear Pap test results.  Other health care providers may not recommend any screening for nonpregnant women who are considered low risk for pelvic cancer and who do not have symptoms. Ask your health care provider if a screening pelvic exam is right for you.  If you have had past treatment for cervical cancer or a condition that could lead to cancer, you need Pap tests and screening for cancer for at least 20 years after your treatment. If Pap tests have been discontinued, your risk factors (such as having a new sexual partner) need to be reassessed to  determine if screening should resume. Some women have medical problems that increase the chance of getting cervical cancer. In these cases, your health care provider may recommend more frequent screening and Pap tests. Colorectal Cancer  This type of cancer can be detected and often prevented.  Routine colorectal cancer screening usually begins at 78 years of age and continues through 78 years of age.  Your health care provider may recommend screening at an earlier age if you have risk factors for colon cancer.  Your health care provider may also recommend using home test kits to check for hidden blood in the stool.  A small camera at the end of a tube can be used to examine your colon directly (sigmoidoscopy or colonoscopy). This is done to check for the earliest forms of colorectal cancer.  Routine screening usually begins at age 77.  Direct examination of the colon should be repeated every 5-10 years through 78 years of age. However, you may need to be screened more often if early forms of precancerous polyps or small growths  are found. Skin Cancer  Check your skin from head to toe regularly.  Tell your health care provider about any new moles or changes in moles, especially if there is a change in a mole's shape or color.  Also tell your health care provider if you have a mole that is larger than the size of a pencil eraser.  Always use sunscreen. Apply sunscreen liberally and repeatedly throughout the day.  Protect yourself by wearing long sleeves, pants, a wide-brimmed hat, and sunglasses whenever you are outside. Heart disease, diabetes, and high blood pressure  High blood pressure causes heart disease and increases the risk of stroke. High blood pressure is more likely to develop in:  People who have blood pressure in the high end of the normal range (130-139/85-89 mm Hg).  People who are overweight or obese.  People who are African American.  If you are 18-39 years of  age, have your blood pressure checked every 3-5 years. If you are 48 years of age or older, have your blood pressure checked every year. You should have your blood pressure measured twice-once when you are at a hospital or clinic, and once when you are not at a hospital or clinic. Record the average of the two measurements. To check your blood pressure when you are not at a hospital or clinic, you can use:  An automated blood pressure machine at a pharmacy.  A home blood pressure monitor.  If you are between 10 years and 82 years old, ask your health care provider if you should take aspirin to prevent strokes.  Have regular diabetes screenings. This involves taking a blood sample to check your fasting blood sugar level.  If you are at a normal weight and have a low risk for diabetes, have this test once every three years after 78 years of age.  If you are overweight and have a high risk for diabetes, consider being tested at a younger age or more often. Preventing infection Hepatitis B  If you have a higher risk for hepatitis B, you should be screened for this virus. You are considered at high risk for hepatitis B if:  You were born in a country where hepatitis B is common. Ask your health care provider which countries are considered high risk.  Your parents were born in a high-risk country, and you have not been immunized against hepatitis B (hepatitis B vaccine).  You have HIV or AIDS.  You use needles to inject street drugs.  You live with someone who has hepatitis B.  You have had sex with someone who has hepatitis B.  You get hemodialysis treatment.  You take certain medicines for conditions, including cancer, organ transplantation, and autoimmune conditions. Hepatitis C  Blood testing is recommended for:  Everyone born from [birth year] through 1967.  Anyone with known risk factors for hepatitis C. Sexually transmitted infections (STIs)  You should be screened for sexually  transmitted infections (STIs) including gonorrhea and chlamydia if:  You are sexually active and are younger than 78 years of age.  You are older than 78 years of age and your health care provider tells you that you are at risk for this type of infection.  Your sexual activity has changed since you were last screened and you are at an increased risk for chlamydia or gonorrhea. Ask your health care provider if you are at risk.  If you do not have HIV, but are at risk, it may be recommended that you take  a prescription medicine daily to prevent HIV infection. This is called pre-exposure prophylaxis (PrEP). You are considered at risk if:  You are sexually active and do not regularly use condoms or know the HIV status of your partner(s).  You take drugs by injection.  You are sexually active with a partner who has HIV. Talk with your health care provider about whether you are at high risk of being infected with HIV. If you choose to begin PrEP, you should first be tested for HIV. You should then be tested every 3 months for as long as you are taking PrEP. Pregnancy  If you are premenopausal and you may become pregnant, ask your health care provider about preconception counseling.  If you may become pregnant, take 400 to 800 micrograms (mcg) of folic acid every day.  If you want to prevent pregnancy, talk to your health care provider about birth control (contraception). Osteoporosis and menopause  Osteoporosis is a disease in which the bones lose minerals and strength with aging. This can result in serious bone fractures. Your risk for osteoporosis can be identified using a bone density scan.  If you are 69 years of age or older, or if you are at risk for osteoporosis and fractures, ask your health care provider if you should be screened.  Ask your health care provider whether you should take a calcium or vitamin D supplement to lower your risk for osteoporosis.  Menopause may have certain  physical symptoms and risks.  Hormone replacement therapy may reduce some of these symptoms and risks. Talk to your health care provider about whether hormone replacement therapy is right for you. Follow these instructions at home:  Schedule regular health, dental, and eye exams.  Stay current with your immunizations.  Do not use any tobacco products including cigarettes, chewing tobacco, or electronic cigarettes.  If you are pregnant, do not drink alcohol.  If you are breastfeeding, limit how much and how often you drink alcohol.  Limit alcohol intake to no more than 1 drink per day for nonpregnant women. One drink equals 12 ounces of beer, 5 ounces of wine, or 1 ounces of hard liquor.  Do not use street drugs.  Do not share needles.  Ask your health care provider for help if you need support or information about quitting drugs.  Tell your health care provider if you often feel depressed.  Tell your health care provider if you have ever been abused or do not feel safe at home. This information is not intended to replace advice given to you by your health care provider. Make sure you discuss any questions you have with your health care provider. Document Released: 05/06/2011 Document Revised: 03/28/2016 Document Reviewed: 07/25/2015 Elsevier Interactive Patient Education  2017 [employer].

## 2017-03-26 NOTE — Assessment & Plan Note (Signed)
Chronic problem.  On Levothyroxine 17mcg daily.  Asymptomatic w/ exception of fatigue.  Check labs.  Adjust meds prn

## 2017-03-26 NOTE — Assessment & Plan Note (Signed)
Chronic problem.  Due for repeat DEXA- this is scheduled.  Check Vit D level.  Replete prn.

## 2017-03-27 ENCOUNTER — Encounter: Payer: Self-pay | Admitting: General Practice

## 2017-03-28 ENCOUNTER — Other Ambulatory Visit: Payer: Self-pay | Admitting: Family Medicine

## 2017-03-28 DIAGNOSIS — Z1231 Encounter for screening mammogram for malignant neoplasm of breast: Secondary | ICD-10-CM | POA: Diagnosis not present

## 2017-03-28 DIAGNOSIS — M810 Age-related osteoporosis without current pathological fracture: Secondary | ICD-10-CM | POA: Diagnosis not present

## 2017-03-28 DIAGNOSIS — M85851 Other specified disorders of bone density and structure, right thigh: Secondary | ICD-10-CM | POA: Diagnosis not present

## 2017-03-28 DIAGNOSIS — Z803 Family history of malignant neoplasm of breast: Secondary | ICD-10-CM | POA: Diagnosis not present

## 2017-03-28 LAB — HM DEXA SCAN

## 2017-03-28 LAB — HM MAMMOGRAPHY

## 2017-04-07 ENCOUNTER — Encounter: Payer: Self-pay | Admitting: Family Medicine

## 2017-04-09 ENCOUNTER — Encounter: Payer: Self-pay | Admitting: General Practice

## 2017-04-09 ENCOUNTER — Ambulatory Visit (INDEPENDENT_AMBULATORY_CARE_PROVIDER_SITE_OTHER): Payer: Medicare Other

## 2017-04-09 DIAGNOSIS — R42 Dizziness and giddiness: Secondary | ICD-10-CM | POA: Diagnosis not present

## 2017-04-09 DIAGNOSIS — R001 Bradycardia, unspecified: Secondary | ICD-10-CM

## 2017-04-13 ENCOUNTER — Encounter: Payer: Self-pay | Admitting: Family Medicine

## 2017-04-15 DIAGNOSIS — Z1212 Encounter for screening for malignant neoplasm of rectum: Secondary | ICD-10-CM | POA: Diagnosis not present

## 2017-04-15 DIAGNOSIS — Z1211 Encounter for screening for malignant neoplasm of colon: Secondary | ICD-10-CM | POA: Diagnosis not present

## 2017-04-17 LAB — COLOGUARD: COLOGUARD: POSITIVE

## 2017-04-23 ENCOUNTER — Telehealth: Payer: Self-pay | Admitting: Cardiovascular Disease

## 2017-04-23 NOTE — Telephone Encounter (Signed)
New Message   pt verbalized that she is calling for rn   Pt had heart monitor on 04-09-2017 and she is calling for results

## 2017-04-23 NOTE — Telephone Encounter (Signed)
Spoke with pt, aware monitor is still in progress in the chart. I have sent a message to the monitor tech to find out the statis. Will make her aware once we find out.

## 2017-04-24 NOTE — Telephone Encounter (Signed)
Spoke with pt, aware results are waiting for provider review.

## 2017-04-30 DIAGNOSIS — R350 Frequency of micturition: Secondary | ICD-10-CM | POA: Diagnosis not present

## 2017-04-30 DIAGNOSIS — N3946 Mixed incontinence: Secondary | ICD-10-CM | POA: Diagnosis not present

## 2017-04-30 DIAGNOSIS — R351 Nocturia: Secondary | ICD-10-CM | POA: Diagnosis not present

## 2017-05-01 ENCOUNTER — Telehealth: Payer: Self-pay | Admitting: General Practice

## 2017-05-01 ENCOUNTER — Encounter: Payer: Self-pay | Admitting: General Practice

## 2017-05-01 DIAGNOSIS — R195 Other fecal abnormalities: Secondary | ICD-10-CM

## 2017-05-01 NOTE — Telephone Encounter (Signed)
Called pt and advised that cologuard came back with positive results. Gi referral placed at this time per PCP.

## 2017-05-02 DIAGNOSIS — H04123 Dry eye syndrome of bilateral lacrimal glands: Secondary | ICD-10-CM | POA: Diagnosis not present

## 2017-05-02 DIAGNOSIS — E119 Type 2 diabetes mellitus without complications: Secondary | ICD-10-CM | POA: Diagnosis not present

## 2017-05-06 DIAGNOSIS — I129 Hypertensive chronic kidney disease with stage 1 through stage 4 chronic kidney disease, or unspecified chronic kidney disease: Secondary | ICD-10-CM | POA: Diagnosis not present

## 2017-05-06 DIAGNOSIS — R918 Other nonspecific abnormal finding of lung field: Secondary | ICD-10-CM | POA: Diagnosis not present

## 2017-05-06 DIAGNOSIS — I701 Atherosclerosis of renal artery: Secondary | ICD-10-CM | POA: Diagnosis not present

## 2017-05-06 DIAGNOSIS — N028 Recurrent and persistent hematuria with other morphologic changes: Secondary | ICD-10-CM | POA: Diagnosis not present

## 2017-05-06 DIAGNOSIS — R93429 Abnormal radiologic findings on diagnostic imaging of unspecified kidney: Secondary | ICD-10-CM | POA: Diagnosis not present

## 2017-05-06 DIAGNOSIS — R32 Unspecified urinary incontinence: Secondary | ICD-10-CM | POA: Diagnosis not present

## 2017-05-06 DIAGNOSIS — Z6841 Body Mass Index (BMI) 40.0 and over, adult: Secondary | ICD-10-CM | POA: Diagnosis not present

## 2017-05-06 DIAGNOSIS — N184 Chronic kidney disease, stage 4 (severe): Secondary | ICD-10-CM | POA: Diagnosis not present

## 2017-05-08 ENCOUNTER — Other Ambulatory Visit: Payer: Self-pay | Admitting: Nephrology

## 2017-05-08 ENCOUNTER — Other Ambulatory Visit: Payer: Self-pay | Admitting: Cardiovascular Disease

## 2017-05-08 ENCOUNTER — Encounter: Payer: Self-pay | Admitting: Cardiovascular Disease

## 2017-05-08 DIAGNOSIS — R9389 Abnormal findings on diagnostic imaging of other specified body structures: Secondary | ICD-10-CM

## 2017-05-08 DIAGNOSIS — I701 Atherosclerosis of renal artery: Secondary | ICD-10-CM

## 2017-05-08 NOTE — Telephone Encounter (Signed)
This encounter was created in error - please disregard.

## 2017-05-09 ENCOUNTER — Encounter: Payer: Self-pay | Admitting: Cardiovascular Disease

## 2017-05-18 ENCOUNTER — Other Ambulatory Visit: Payer: Medicare Other

## 2017-05-20 DIAGNOSIS — N3946 Mixed incontinence: Secondary | ICD-10-CM | POA: Diagnosis not present

## 2017-05-20 DIAGNOSIS — N184 Chronic kidney disease, stage 4 (severe): Secondary | ICD-10-CM | POA: Diagnosis not present

## 2017-05-21 ENCOUNTER — Ambulatory Visit
Admission: RE | Admit: 2017-05-21 | Discharge: 2017-05-21 | Disposition: A | Discharge status: Home / Self Care | Payer: Medicare Other | Source: Ambulatory Visit | Attending: Nephrology | Admitting: Nephrology

## 2017-05-21 DIAGNOSIS — N2889 Other specified disorders of kidney and ureter: Secondary | ICD-10-CM | POA: Diagnosis not present

## 2017-05-21 DIAGNOSIS — R9389 Abnormal findings on diagnostic imaging of other specified body structures: Secondary | ICD-10-CM

## 2017-05-27 ENCOUNTER — Encounter: Payer: Self-pay | Admitting: Family Medicine

## 2017-05-27 ENCOUNTER — Other Ambulatory Visit: Payer: Self-pay | Admitting: General Practice

## 2017-05-27 MED ORDER — HYDRALAZINE HCL 50 MG PO TABS
50.0000 mg | ORAL_TABLET | Freq: Two times a day (BID) | ORAL | 1 refills | Status: DC
Start: 2017-05-27 — End: 2017-12-25

## 2017-05-30 ENCOUNTER — Emergency Department (HOSPITAL_BASED_OUTPATIENT_CLINIC_OR_DEPARTMENT_OTHER): Payer: Medicare Other

## 2017-05-30 ENCOUNTER — Encounter (HOSPITAL_BASED_OUTPATIENT_CLINIC_OR_DEPARTMENT_OTHER): Payer: Self-pay | Admitting: *Deleted

## 2017-05-30 ENCOUNTER — Emergency Department (HOSPITAL_BASED_OUTPATIENT_CLINIC_OR_DEPARTMENT_OTHER)
Admission: EM | Admit: 2017-05-30 | Discharge: 2017-05-30 | Disposition: A | Discharge status: Home / Self Care | Payer: Medicare Other | Attending: Emergency Medicine | Admitting: Emergency Medicine

## 2017-05-30 DIAGNOSIS — Y929 Unspecified place or not applicable: Secondary | ICD-10-CM | POA: Insufficient documentation

## 2017-05-30 DIAGNOSIS — M25331 Other instability, right wrist: Secondary | ICD-10-CM | POA: Diagnosis not present

## 2017-05-30 DIAGNOSIS — I130 Hypertensive heart and chronic kidney disease with heart failure and stage 1 through stage 4 chronic kidney disease, or unspecified chronic kidney disease: Secondary | ICD-10-CM | POA: Diagnosis not present

## 2017-05-30 DIAGNOSIS — Y999 Unspecified external cause status: Secondary | ICD-10-CM | POA: Diagnosis not present

## 2017-05-30 DIAGNOSIS — S52501A Unspecified fracture of the lower end of right radius, initial encounter for closed fracture: Secondary | ICD-10-CM

## 2017-05-30 DIAGNOSIS — Z85828 Personal history of other malignant neoplasm of skin: Secondary | ICD-10-CM | POA: Diagnosis not present

## 2017-05-30 DIAGNOSIS — I2510 Atherosclerotic heart disease of native coronary artery without angina pectoris: Secondary | ICD-10-CM | POA: Insufficient documentation

## 2017-05-30 DIAGNOSIS — E039 Hypothyroidism, unspecified: Secondary | ICD-10-CM | POA: Insufficient documentation

## 2017-05-30 DIAGNOSIS — N183 Chronic kidney disease, stage 3 (moderate): Secondary | ICD-10-CM | POA: Diagnosis not present

## 2017-05-30 DIAGNOSIS — Y939 Activity, unspecified: Secondary | ICD-10-CM | POA: Insufficient documentation

## 2017-05-30 DIAGNOSIS — I5031 Acute diastolic (congestive) heart failure: Secondary | ICD-10-CM | POA: Insufficient documentation

## 2017-05-30 DIAGNOSIS — S63511A Sprain of carpal joint of right wrist, initial encounter: Secondary | ICD-10-CM | POA: Insufficient documentation

## 2017-05-30 DIAGNOSIS — J45909 Unspecified asthma, uncomplicated: Secondary | ICD-10-CM | POA: Diagnosis not present

## 2017-05-30 DIAGNOSIS — Z85118 Personal history of other malignant neoplasm of bronchus and lung: Secondary | ICD-10-CM | POA: Insufficient documentation

## 2017-05-30 DIAGNOSIS — W0110XA Fall on same level from slipping, tripping and stumbling with subsequent striking against unspecified object, initial encounter: Secondary | ICD-10-CM | POA: Insufficient documentation

## 2017-05-30 DIAGNOSIS — S52591A Other fractures of lower end of right radius, initial encounter for closed fracture: Secondary | ICD-10-CM | POA: Diagnosis not present

## 2017-05-30 DIAGNOSIS — S6991XA Unspecified injury of right wrist, hand and finger(s), initial encounter: Secondary | ICD-10-CM | POA: Diagnosis present

## 2017-05-30 MED ORDER — HYDROCODONE-ACETAMINOPHEN 5-325 MG PO TABS: 1.0000 | ORAL_TABLET | Freq: Four times a day (QID) | ORAL | 0 refills | Status: DC | PRN

## 2017-05-30 MED ORDER — ACETAMINOPHEN 500 MG PO TABS
1000.0000 mg | ORAL_TABLET | Freq: Once | ORAL | Status: AC
  Administered 2017-05-30: 1000 mg via ORAL
  Filled 2017-05-30: qty 2

## 2017-05-30 NOTE — ED Triage Notes (Signed)
Fall today. Denies LOC. States that she was taking her shoes off and fell into the door frame.  Right wrist swelling and pain.  CMS intact.

## 2017-05-30 NOTE — ED Provider Notes (Signed)
[facility] DEPT Provider Note   CSN: [id number] Arrival date & time: 05/30/17  1226     History   Chief Complaint Chief Complaint  Patient presents with  . Wrist Pain    HPI Susan Pittman is a 76 y.o. female.  HPI  Patient was taking off her shoe by standing on 1 foot and using the other foot to push down on the back of it. She lost her balance while doing this as she fell down and braced herself with her wrist. She had pain afterwards. She did not feel a crack or anything. She has pain and swelling to her wrist and middle forearm since that time. She did not hit or hurt anything else. She did not hit her head nor did she pass out.  Past Medical History:  Diagnosis Date  . Allergy   . Arthritis    "knees, hips, back, hands" (11/08/2013)  . Asthma   . Basal cell carcinoma    "cut them off face & right shoulder" (11/08/2013)  . CAD (coronary artery disease), non obstructive by cardiac cath 12/12/13 01/24/2014  . CKD (chronic kidney disease), stage III   . Diastolic CHF ([place])   . DJD (degenerative joint disease)   . Gastric ulcer   . GERD (gastroesophageal reflux disease)   . Hiatal hernia   . History of steroid induced diabetes   . Hyperlipidemia    "don't take RX for it" (11/08/2013)  . Hypertension   . Hypothyroid   . Lung mass    superior segment LLL/notes 11/08/2013  . Lung nodule/ adenoca > LLobectomy 12/15/13  11/08/2013   PET 11/23/2013 1. Dominant sub solid left lower lobe nodule does not demonstrate significantly increased metabolic activity. However, morphologically, adenocarcinoma is still a concern.   - Spirometry 11/24/13 wnl  - 11/24/2013 T surg eval rec> LLower lobectomy 12/15/2013 for adenoca   . Nephropathy   . Osteoporosis   . Pneumonia    "I've had it ?3 times" (11/08/2013)  . Renal artery stenosis [facility])    with stent placement    Patient Active Problem List   Diagnosis Date Noted  . Anxiety and depression 03/26/2017  . Urinary incontinence 09/16/2016  .  Bilateral knee pain 05/02/2016  . Trochanteric bursitis of left hip 01/11/2016  . Low back pain 10/02/2015  . Tremor of both hands 09/21/2014  . Gout due to renal impairment 08/30/2014  . Physical deconditioning 06/23/2014  . UTI (lower urinary tract infection) 06/13/2014  . CKD (chronic kidney disease) stage 3, GFR 30-59 ml/min 05/27/2014  . IgA nephropathy 05/27/2014  . Chronic diastolic heart failure ([place]) 05/03/2014  . Hematuria 02/25/2014  . Diastolic dysfunction- grade 2 02/16/2014  . Leg edema 02/11/2014  . CAD (coronary artery disease), non obstructive by cardiac cath 12/12/13 01/24/2014  . CKD (chronic kidney disease) stage 4, GFR 15-29 ml/min ([facility]) 01/03/2014  . Lung cancer, left lower lobe 12/17/2013  . Status post lobectomy of lung 12/15/2013  . Chest pain with moderate risk of acute coronary syndrome 12/12/2013  . Family history of coronary artery bypass surgery 12/12/2013  . Renal artery atherosclerosis, s/p right RA stent 11/08/13 11/08/2013  . Lung nodule/ adenoca > LLobectomy 12/15/13  11/08/2013  . Premature atrial contractions 10/31/2013  . Hearing loss secondary to cerumen impaction 09/20/2013  . Allergic rhinitis 02/11/2013  . Shoulder pain 03/11/2012  . Upper arm pain 01/26/2012  . General medical examination 07/25/2011  . Obesity- BMI 41 05/10/2011  . Dizziness 04/10/2011  .  Hyperlipidemia 02/28/2011  . EDEMA- LOCALIZED 06/04/2010  . Osteoporosis 01/15/2010  . CARCINOMA, BASAL CELL 09/22/2009  . ESOPHAGITIS 05/19/2009  . GASTRIC ULCER 05/19/2009  . HIATAL HERNIA 05/19/2009  . Degenerative arthritis of knee, bilateral 05/19/2009  . Hypothyroidism 03/23/2009  . Essential hypertension 03/23/2009  . ASTHMA 03/23/2009  . GERD 03/23/2009  . COLONIC POLYPS, HX OF 03/23/2009    Past Surgical History:  Procedure Laterality Date  . ANTERIOR CERVICAL DECOMP/DISCECTOMY FUSION  2000   C5-C6  . CARDIAC CATHETERIZATION     Hx: of 2/ 2015  . CHOLECYSTECTOMY  1970's   . CORONARY ANGIOGRAM     Hx: of 2/ 2015  . DILATION AND CURETTAGE OF UTERUS  1960's  . KNEE ARTHROSCOPY Bilateral   . LEFT HEART CATHETERIZATION WITH CORONARY ANGIOGRAM N/A 12/13/2013   Procedure: LEFT HEART CATHETERIZATION WITH CORONARY ANGIOGRAM;  Surgeon: Burnell Blanks, MD;  Location: [facility] CATH LAB;  Service: Cardiovascular;  Laterality: N/A;  . LOBECTOMY Left 12/15/2013   Procedure: LOBECTOMY;  Surgeon: Grace Isaac, MD;  Location: [facility];  Service: Thoracic;  Laterality: Left;  . PERCUTANEOUS STENT INTERVENTION Right 11/08/2013   Procedure: PERCUTANEOUS STENT INTERVENTION;  Surgeon: Lorretta Harp, MD;  Location: [facility] CATH LAB;  Service: Cardiovascular;  Laterality: Right;  rt renal artery stent  . RENAL ANGIOGRAM Bilateral 11/08/2013   Procedure: RENAL ANGIOGRAM;  Surgeon: Lorretta Harp, MD;  Location: [facility] CATH LAB;  Service: Cardiovascular;  Laterality: Bilateral;  . RENAL ARTERY STENT Right 11/08/2013   Archie Endo 11/08/2013  . SKIN CANCER EXCISION    . TONSILLECTOMY AND ADENOIDECTOMY  ?1946  . VAGINAL HYSTERECTOMY  1970  . VIDEO ASSISTED THORACOSCOPY (VATS)/WEDGE RESECTION Left 12/15/2013   Procedure: VIDEO ASSISTED THORACOSCOPY (VATS)/WEDGE RESECTION;  Surgeon: Grace Isaac, MD;  Location: [facility];  Service: Thoracic;  Laterality: Left;  Marland Kitchen VIDEO BRONCHOSCOPY N/A 12/15/2013   Procedure: VIDEO BRONCHOSCOPY;  Surgeon: Grace Isaac, MD;  Location: [facility] OR;  Service: Thoracic;  Laterality: N/A;    OB History    No data available       Home Medications    Prior to Admission medications   Medication Sig Start Date End Date Taking? Authorizing Provider  Fesoterodine Fumarate (TOVIAZ PO) Take by mouth.   Yes [provider]  acetaminophen (TYLENOL) 500 MG tablet Take 500 mg by mouth 3 (three) times daily.    [provider]  albuterol (PROVENTIL HFA;VENTOLIN HFA) 108 (90 BASE) MCG/ACT inhaler Inhale 1-2 puffs into the lungs every 6 (six) hours as needed for  wheezing or shortness of breath. 12/31/13   Grace Isaac, MD  allopurinol (ZYLOPRIM) 100 MG tablet TAKE TWO TABLETS BY MOUTH IN THE MORNING  03/28/17   [name], MD  Calcium Carb-Cholecalciferol 600-800 MG-UNIT TABS Take 2 tablets by mouth daily.     [provider]  cetirizine (ZYRTEC) 10 MG tablet Take 10 mg by mouth at bedtime.     [provider]  Flaxseed, Linseed, (FLAX SEEDS PO) Take by mouth.    [provider]  fluticasone (FLONASE) 50 MCG/ACT nasal spray USE TWO SPRAY(S) IN EACH NOSTRIL ONCE DAILY 02/12/16   [name], MD  furosemide (LASIX) 40 MG tablet Take 40 mg by mouth 2 (two) times daily.    [provider]  glucose blood (ONE TOUCH ULTRA TEST) test strip Dx E09.9. Please use one strip each time sugars are checked. Pt tests 2 times daily. 01/31/16   [name],  Aundra Millet, MD  hydrALAZINE (APRESOLINE) 50 MG tablet Take 1 tablet (50 mg total) by mouth 2 (two) times daily. 05/27/17   [name], MD  HYDROcodone-acetaminophen (NORCO/VICODIN) 5-325 MG tablet Take 1 tablet by mouth every 6 (six) hours as needed for severe pain. 05/30/17   Zaina Jenkin, Corene Cornea, MD  Lancets [facility] ULTRASOFT) lancets Use 1 per day dx code 249.00 04/19/14   Elayne Snare, MD  lansoprazole (PREVACID) 15 MG capsule Take 15 mg by mouth daily at 12 noon.    [provider]  levothyroxine (SYNTHROID, LEVOTHROID) 88 MCG tablet TAKE ONE TABLET BY MOUTH ONE TIME DAILY BEFORE BREAKFAST 03/21/17   [name], MD  nebivolol (BYSTOLIC) 5 MG tablet Take 10 mg by mouth daily.     [provider]  [facility] DELICA LANCETS 78H MISC Use to check blood sugar once daily as instructed. Dx code: E11.9 02/22/15   [name], MD  sertraline (ZOLOFT) 50 MG tablet TAKE ONE AND ONE-HALF TABLETS BY MOUTH ONE TIME DAILY  03/21/17   [name], MD    Family History Family History  Problem Relation Age of Onset  . Breast cancer Sister   .  Heart disease Mother   . Emphysema Father        smoked  . Heart disease Father   . Diabetes Father   . Stroke Father   . Thyroid disease Sister   . Allergies Sister   . Allergies Brother   . Diabetes Brother   . Heart failure Neg Hx     Social History Social History  Substance Use Topics  . Smoking status: Never Smoker  . Smokeless tobacco: Never Used  . Alcohol use No     Allergies   Benazepril hcl; Loratadine; Allegra [fexofenadine hcl]; Celecoxib; Fexofenadine; Lisinopril; Sulfa antibiotics; and Sulfur   Review of Systems Review of Systems  All other systems reviewed and are negative.    Physical Exam Updated Vital Signs BP (!) 141/79 (BP Location: Left Arm)   Pulse (!) 56   Temp 97.7 F (36.5 C) (Oral)   Resp 18   Ht 5\' 4"  (1.626 m)   Wt 104.3 kg (230 lb)   SpO2 100%   BMI 39.48 kg/m   Physical Exam  Constitutional: She is oriented to person, place, and time. She appears well-developed and well-nourished.  HENT:  Head: Normocephalic and atraumatic.  Eyes: Conjunctivae and EOM are normal.  Neck: Normal range of motion.  Cardiovascular: Normal rate and regular rhythm.   Pulmonary/Chest: No stridor. No respiratory distress.  Abdominal: Soft. She exhibits no distension.  Musculoskeletal: She exhibits tenderness (and ecchymosis to right wrist. Also has a small ecchymotic areaand tenderness to her mid forearm on the right side. She is neurovascularly intact distally.).  Neurological: She is alert and oriented to person, place, and time. No cranial nerve deficit. Coordination normal.  Skin: Skin is warm and dry. No pallor.  Nursing note and vitals reviewed.    ED Treatments / Results  Labs (all labs ordered are listed, but only abnormal results are displayed) Labs Reviewed - No data to display  EKG  EKG Interpretation None       Radiology Dg Forearm Right  Result Date: 05/30/2017 CLINICAL DATA:  Fall.  Wrist swelling EXAM: RIGHT FOREARM - 2  VIEW COMPARISON:  Right wrist from today FINDINGS: Nondisplaced transverse fracture distal radius. Fracture extends across the radial styloid into the joint. Widening of the scapholunate joint compatible with ligament  injury. IMPRESSION: Nondisplaced fracture distal radius Scapholunate ligament injury. Electronically Signed   By: Franchot Gallo M.D.   On: 05/30/2017 12:55   Dg Wrist Complete Right  Result Date: 05/30/2017 CLINICAL DATA:  Fall EXAM: RIGHT WRIST - COMPLETE 3+ VIEW COMPARISON:  None. FINDINGS: Nondisplaced fracture distal radius extending into the radiocarpal joint. No fracture of the ulna. Soft tissue swelling and joint effusion. Degenerative change of the base of the thumb. Widening of the scapholunate joint suggesting acute or chronic ligament injury. IMPRESSION: Nondisplaced fracture distal radius. Widening of the scapholunate joint compatible with ligament injury Electronically Signed   By: Franchot Gallo M.D.   On: 05/30/2017 12:54    Procedures Procedures (including critical care time)  Medications Ordered in ED Medications  acetaminophen (TYLENOL) tablet 1,000 mg (1,000 mg Oral Given 05/30/17 1250)     Initial Impression / Assessment and Plan / ED Course  I have reviewed the triage vital signs and the nursing notes.  Pertinent labs & imaging results that were available during my care of the patient were reviewed by me and considered in my medical decision making (see chart for details).    X-ray and Tylenol.  Distal radius fracture and scapholunate disruption. Will follow up with hand surgery next week. Put in a sugar tong splint and shoulder sling. NVI afterwards.   Final Clinical Impressions(s) / ED Diagnoses   Final diagnoses:  Closed fracture of distal end of right radius, unspecified fracture morphology, initial encounter  Scapholunate instability of right wrist    New Prescriptions Discharge Medication List as of 05/30/2017  1:36 PM    START taking these  medications   Details  HYDROcodone-acetaminophen (NORCO/VICODIN) 5-325 MG tablet Take 1 tablet by mouth every 6 (six) hours as needed for severe pain., Starting Fri 05/30/2017, Print         Kage Willmann, Corene Cornea, MD 05/31/17 2023

## 2017-05-30 NOTE — ED Notes (Signed)
Ice applied to splint

## 2017-06-02 ENCOUNTER — Ambulatory Visit (HOSPITAL_COMMUNITY)
Admission: RE | Admit: 2017-06-02 | Discharge: 2017-06-02 | Disposition: A | Discharge status: Home / Self Care | Payer: Medicare Other | Source: Ambulatory Visit | Attending: Cardiovascular Disease | Admitting: Cardiovascular Disease

## 2017-06-02 DIAGNOSIS — I701 Atherosclerosis of renal artery: Secondary | ICD-10-CM | POA: Diagnosis not present

## 2017-06-03 DIAGNOSIS — S52551A Other extraarticular fracture of lower end of right radius, initial encounter for closed fracture: Secondary | ICD-10-CM | POA: Diagnosis not present

## 2017-06-17 DIAGNOSIS — S52551D Other extraarticular fracture of lower end of right radius, subsequent encounter for closed fracture with routine healing: Secondary | ICD-10-CM | POA: Diagnosis not present

## 2017-06-18 ENCOUNTER — Other Ambulatory Visit: Payer: Self-pay | Admitting: Family Medicine

## 2017-06-25 ENCOUNTER — Other Ambulatory Visit: Payer: Self-pay | Admitting: Family Medicine

## 2017-07-03 ENCOUNTER — Encounter: Payer: Self-pay | Admitting: Family Medicine

## 2017-07-08 ENCOUNTER — Encounter: Payer: Self-pay | Admitting: Family Medicine

## 2017-07-08 ENCOUNTER — Ambulatory Visit (INDEPENDENT_AMBULATORY_CARE_PROVIDER_SITE_OTHER): Payer: Medicare Other | Admitting: Family Medicine

## 2017-07-08 ENCOUNTER — Other Ambulatory Visit: Payer: Self-pay | Admitting: General Practice

## 2017-07-08 VITALS — BP 110/53 | HR 51 | Temp 98.2°F | Resp 16 | Ht 64.0 in | Wt 236.1 lb

## 2017-07-08 DIAGNOSIS — I701 Atherosclerosis of renal artery: Secondary | ICD-10-CM

## 2017-07-08 DIAGNOSIS — Z23 Encounter for immunization: Secondary | ICD-10-CM | POA: Diagnosis not present

## 2017-07-08 DIAGNOSIS — R42 Dizziness and giddiness: Secondary | ICD-10-CM

## 2017-07-08 MED ORDER — MECLIZINE HCL 25 MG PO TABS: 25.0000 mg | ORAL_TABLET | Freq: Three times a day (TID) | ORAL | 0 refills | Status: DC | PRN

## 2017-07-08 MED ORDER — FLUTICASONE PROPIONATE 50 MCG/ACT NA SUSP: NASAL | 2 refills | Status: DC

## 2017-07-08 MED ORDER — MECLIZINE HCL 25 MG PO TABS: 25.0000 mg | ORAL_TABLET | Freq: Three times a day (TID) | ORAL | 0 refills | Status: AC | PRN

## 2017-07-08 MED ORDER — FLUTICASONE PROPIONATE 50 MCG/ACT NA SUSP
NASAL | 2 refills | Status: DC
Start: 2017-07-08 — End: 2017-07-08

## 2017-07-08 NOTE — Patient Instructions (Signed)
Follow up as scheduled or as needed Restart the Flonase- 2 sprays each nostril daily Continue the Zyrtec daily Drink plenty of fluids Continue to do the exercises that your sister showed you Use the Meclizine as needed for dizziness If no improvement (or worsening) please let me know and we'll consider prednisone Call with any questions or concerns Hang in there!!!

## 2017-07-08 NOTE — Assessment & Plan Note (Signed)
New.  Pt has had dizziness previously and twin sister often has sxs.  Hx and PE are consistent w/ positional vertigo and no red flags on hx/PE.  Restart Flonase.  Add Meclizine.  If no improvement, will consider short course of prednisone.  Pt expressed understanding and is in agreement w/ plan.

## 2017-07-08 NOTE — Progress Notes (Signed)
Pre visit review using our clinic review tool, if applicable. No additional management support is needed unless otherwise documented below in the visit note. 

## 2017-07-08 NOTE — Progress Notes (Signed)
   Subjective:    Patient ID: Susan Pittman, female    DOB: 1941/07/18, 76 y.o.   MRN: [id number]  HPI Dizziness- 'I think i've got an inner ear infection or something'.  Saturday night she had vertigo and was unable to walk to the restroom w/o assistance.  Sunday night was similar.  Last night she was able to walk w/o dizziness but this AM the dizziness had returned.  Pt has sensation that she is spinning.  Pt will only get dizziness w/ movement.  + nausea- worse in the AM.  Pt has never had vertigo previously but twin sister suffers regularly from this.  Mild frontal headache.  Denies sinus pain/pressure.  No ear pain.  Pt did have extreme dizziness w/ rolling over in bed.  Pt has been doing a vertigo exercise that her sister showed her w/ some relief.   Review of Systems For ROS see HPI     Objective:   Physical Exam  Constitutional: She is oriented to person, place, and time. She appears well-developed and well-nourished. No distress.  HENT:  Head: Normocephalic and atraumatic.  Mouth/Throat: Uvula is midline and mucous membranes are normal.  TMs WNL No TTP over sinuses Minimal nasal congestion  Eyes: Pupils are equal, round, and reactive to light. Conjunctivae and EOM are normal.  2-3 beats of horizontal nystagmus  Neck: Normal range of motion. Neck supple.  Cardiovascular: Normal rate, regular rhythm, normal heart sounds and intact distal pulses.   Pulmonary/Chest: Effort normal and breath sounds normal. No respiratory distress. She has no wheezes. She has no rales.  Musculoskeletal: She exhibits no edema.  Lymphadenopathy:    She has no cervical adenopathy.  Neurological: She is alert and oriented to person, place, and time. She displays normal reflexes. No cranial nerve deficit. She exhibits normal muscle tone. Coordination normal.  Skin: Skin is warm and dry.  Psychiatric: She has a normal mood and affect. Her behavior is normal. Judgment and thought content normal.  Vitals  reviewed.         Assessment & Plan:

## 2017-07-09 DIAGNOSIS — N3946 Mixed incontinence: Secondary | ICD-10-CM | POA: Diagnosis not present

## 2017-07-09 DIAGNOSIS — R350 Frequency of micturition: Secondary | ICD-10-CM | POA: Diagnosis not present

## 2017-07-10 DIAGNOSIS — S52551A Other extraarticular fracture of lower end of right radius, initial encounter for closed fracture: Secondary | ICD-10-CM | POA: Diagnosis not present

## 2017-07-10 DIAGNOSIS — S52551D Other extraarticular fracture of lower end of right radius, subsequent encounter for closed fracture with routine healing: Secondary | ICD-10-CM | POA: Diagnosis not present

## 2017-07-14 ENCOUNTER — Other Ambulatory Visit: Payer: Self-pay | Admitting: Cardiovascular Disease

## 2017-07-14 ENCOUNTER — Encounter: Payer: Self-pay | Admitting: Family Medicine

## 2017-07-14 NOTE — Telephone Encounter (Signed)
Refill Request.  

## 2017-07-24 ENCOUNTER — Encounter: Payer: Self-pay | Admitting: Family Medicine

## 2017-07-24 ENCOUNTER — Other Ambulatory Visit: Payer: Self-pay | Admitting: Family Medicine

## 2017-07-24 DIAGNOSIS — R42 Dizziness and giddiness: Secondary | ICD-10-CM

## 2017-07-31 DIAGNOSIS — S52551D Other extraarticular fracture of lower end of right radius, subsequent encounter for closed fracture with routine healing: Secondary | ICD-10-CM | POA: Diagnosis not present

## 2017-08-25 ENCOUNTER — Encounter: Payer: Self-pay | Admitting: Family Medicine

## 2017-08-25 ENCOUNTER — Ambulatory Visit (INDEPENDENT_AMBULATORY_CARE_PROVIDER_SITE_OTHER): Payer: Medicare Other | Admitting: Family Medicine

## 2017-08-25 DIAGNOSIS — G8929 Other chronic pain: Secondary | ICD-10-CM

## 2017-08-25 DIAGNOSIS — I701 Atherosclerosis of renal artery: Secondary | ICD-10-CM

## 2017-08-25 DIAGNOSIS — M25561 Pain in right knee: Secondary | ICD-10-CM

## 2017-08-25 DIAGNOSIS — M25562 Pain in left knee: Secondary | ICD-10-CM

## 2017-08-25 MED ORDER — METHYLPREDNISOLONE ACETATE 40 MG/ML IJ SUSP
40.0000 mg | Freq: Once | INTRAMUSCULAR | Status: AC
  Administered 2017-08-25: 40 mg via INTRA_ARTICULAR

## 2017-08-25 NOTE — Progress Notes (Addendum)
PCP and consultation requested by: [name], MD  Subjective:   HPI: Patient is a 76 y.o. female here for right knee pain.  3/8: Patient reports she's had off and on pain in left knee and left hip that worsened after coming off her prednisone (for IgA nephropathy). Was on this for two years and just off past two months. Pain in left knee is mainly posterior, 0/10 at rest, up to 10/10 and sharp at times. Worse with ambulation. Has a history of a bakers cyst here. Hip pain is lateral, worse and sharp when lying down on this side. Pain here 0/10 at rest, 7/10 at worst. No skin changes, fever, other complaints.  4/7: Patient reports she feels much better. No pain with walking in knee or hip. Pain level 0/10. Some soreness only to touch in these areas and if lying on the left side. No numbness, skin changes.  6/28: Patient reports both of her knees are hurting now. Pain level is 0/10 at rest, up to 8/10 and very stiff, achy with walking. Using a cane. Most pain anterior knees - radiates some posteriorly. She has pain in ankles as well anteriorly. No numbness, skin changes.  8/23: Patient reports she is doing well today. Had a bad day a couple PT sessions ago. Done about 10 visits so far with PT and made good progress. Doing water aerobics. Using a cane. Not taking any medications - concerned about her kidneys being affected by any medication - has not discussed with nephrologist yet. Pain level 0/10 today. Gets soreness of knees primarily with ambulation. No skin changes, numbness.  12/19: Patient reports her right knee has started bothering her - giving out, pain is 2/10 at rest, dull and anterior. Interested in cortisone shot for knee and getting approval for viscosupplementation. Pain on both hips when lying on sides though left side is worse. No radiation of pain. Not doing home exercises as much as she should. Not taking anything for pain. No skin changes,  numbness.  11/15/16: Patient returns to start viscosupplementation in both knees. Pain currently 0/10. No skin changes.  11/22/16: Patient returns for supartz. She's doing better following injections. No skin changes, other complaints.  11/29/16: Patient reports she is doing very well. No pain currently. No skin changes, numbness.  10/22: Patient returns with worsening anterior right knee pain over past 2-3 months. No new injury or trauma. Pain is 5/10 and sharp, worse with walking. Worse medially also. No skin changes, numbness.  Past Medical History:  Diagnosis Date  . Allergy   . Arthritis    "knees, hips, back, hands" (11/08/2013)  . Asthma   . Basal cell carcinoma    "cut them off face & right shoulder" (11/08/2013)  . CAD (coronary artery disease), non obstructive by cardiac cath 12/12/13 01/24/2014  . CKD (chronic kidney disease), stage III ([place])   . Diastolic CHF ([place])   . DJD (degenerative joint disease)   . Gastric ulcer   . GERD (gastroesophageal reflux disease)   . Hiatal hernia   . History of steroid induced diabetes   . Hyperlipidemia    "don't take RX for it" (11/08/2013)  . Hypertension   . Hypothyroid   . Lung mass    superior segment LLL/notes 11/08/2013  . Lung nodule/ adenoca > LLobectomy 12/15/13  11/08/2013   PET 11/23/2013 1. Dominant sub solid left lower lobe nodule does not demonstrate significantly increased metabolic activity. However, morphologically, adenocarcinoma is still a concern.   -  Spirometry 11/24/13 wnl  - 11/24/2013 T surg eval rec> LLower lobectomy 12/15/2013 for adenoca   . Nephropathy   . Osteoporosis   . Pneumonia    "I've had it ?3 times" (11/08/2013)  . Renal artery stenosis [facility])    with stent placement    Current Outpatient Prescriptions on File Prior to Visit  Medication Sig Dispense Refill  . acetaminophen (TYLENOL) 500 MG tablet Take 500 mg by mouth 3 (three) times daily.    Marland Kitchen albuterol (PROVENTIL HFA;VENTOLIN HFA) 108 (90 BASE)  MCG/ACT inhaler Inhale 1-2 puffs into the lungs every 6 (six) hours as needed for wheezing or shortness of breath. 18 g 1  . allopurinol (ZYLOPRIM) 100 MG tablet TAKE 2 TABLETS BY MOUTH IN THE MORNING 180 tablet 0  . Calcium Carb-Cholecalciferol 600-800 MG-UNIT TABS Take 2 tablets by mouth daily.     . cetirizine (ZYRTEC) 10 MG tablet Take 10 mg by mouth at bedtime.     Marland Kitchen Fesoterodine Fumarate (TOVIAZ PO) Take by mouth.    . fluticasone (FLONASE) 50 MCG/ACT nasal spray USE TWO SPRAY(S) IN EACH NOSTRIL ONCE DAILY 16 g 2  . furosemide (LASIX) 40 MG tablet Take 40 mg by mouth 2 (two) times daily.    Marland Kitchen glucose blood (ONE TOUCH ULTRA TEST) test strip Dx E09.9. Please use one strip each time sugars are checked. Pt tests 2 times daily. 100 each 6  . hydrALAZINE (APRESOLINE) 50 MG tablet Take 1 tablet (50 mg total) by mouth 2 (two) times daily. 180 tablet 1  . Lancets (ONETOUCH ULTRASOFT) lancets Use 1 per day dx code 249.00 100 each 12  . lansoprazole (PREVACID) 15 MG capsule Take 15 mg by mouth daily at 12 noon.    Marland Kitchen levothyroxine (SYNTHROID, LEVOTHROID) 88 MCG tablet TAKE 1 TABLET BY MOUTH DAILY BEFORE BREAKFAST  90 tablet 0  . meclizine (ANTIVERT) 25 MG tablet Take 1 tablet (25 mg total) by mouth 3 (three) times daily as needed for dizziness. 45 tablet 0  . ONETOUCH DELICA LANCETS 44W MISC Use to check blood sugar once daily as instructed. Dx code: E11.9 100 each 2  . sertraline (ZOLOFT) 50 MG tablet TAKE 1 AND 1/2 TABLETS BY MOUTH ONCE A DAY  126 tablet 0   No current facility-administered medications on file prior to visit.     Past Surgical History:  Procedure Laterality Date  . ANTERIOR CERVICAL DECOMP/DISCECTOMY FUSION  2000   C5-C6  . CARDIAC CATHETERIZATION     Hx: of 2/ 2015  . CHOLECYSTECTOMY  1970's  . CORONARY ANGIOGRAM     Hx: of 2/ 2015  . DILATION AND CURETTAGE OF UTERUS  1960's  . KNEE ARTHROSCOPY Bilateral   . LEFT HEART CATHETERIZATION WITH CORONARY ANGIOGRAM N/A 12/13/2013    Procedure: LEFT HEART CATHETERIZATION WITH CORONARY ANGIOGRAM;  Surgeon: Burnell Blanks, MD;  Location: [facility] CATH LAB;  Service: Cardiovascular;  Laterality: N/A;  . LOBECTOMY Left 12/15/2013   Procedure: LOBECTOMY;  Surgeon: Grace Isaac, MD;  Location: [place];  Service: Thoracic;  Laterality: Left;  . PERCUTANEOUS STENT INTERVENTION Right 11/08/2013   Procedure: PERCUTANEOUS STENT INTERVENTION;  Surgeon: Lorretta Harp, MD;  Location: [facility] CATH LAB;  Service: Cardiovascular;  Laterality: Right;  rt renal artery stent  . RENAL ANGIOGRAM Bilateral 11/08/2013   Procedure: RENAL ANGIOGRAM;  Surgeon: Lorretta Harp, MD;  Location: [facility] CATH LAB;  Service: Cardiovascular;  Laterality: Bilateral;  . RENAL ARTERY STENT Right 11/08/2013   Archie Endo  11/08/2013  . SKIN CANCER EXCISION    . TONSILLECTOMY AND ADENOIDECTOMY  ?1946  . VAGINAL HYSTERECTOMY  1970  . VIDEO ASSISTED THORACOSCOPY (VATS)/WEDGE RESECTION Left 12/15/2013   Procedure: VIDEO ASSISTED THORACOSCOPY (VATS)/WEDGE RESECTION;  Surgeon: Grace Isaac, MD;  Location: [place];  Service: Thoracic;  Laterality: Left;  Marland Kitchen VIDEO BRONCHOSCOPY N/A 12/15/2013   Procedure: VIDEO BRONCHOSCOPY;  Surgeon: Grace Isaac, MD;  Location: [facility] OR;  Service: Thoracic;  Laterality: N/A;    Allergies  Allergen Reactions  . Benazepril Hcl Anaphylaxis and Cough  . Loratadine Anaphylaxis    anaphylaxis  . Allegra [Fexofenadine Hcl] Other (See Comments)    Severe back pain  . Celecoxib Other (See Comments)    REACTION: aching  . Fexofenadine Other (See Comments)    Severe back pain  . Lisinopril     REACTION: cough  . Sulfa Antibiotics Hives  . Sulfur Rash    Social History   Social History  . Marital status: Married    Spouse name: N/A  . Number of children: N/A  . Years of education: N/A   Occupational History  . Retired    Social History Main Topics  . Smoking status: Never Smoker  . Smokeless tobacco: Never Used  . Alcohol use No   . Drug use: No  . Sexual activity: Yes   Other Topics Concern  . Not on file   Social History Narrative  . No narrative on file    Family History  Problem Relation Age of Onset  . Breast cancer Sister   . Heart disease Mother   . Emphysema Father        smoked  . Heart disease Father   . Diabetes Father   . Stroke Father   . Thyroid disease Sister   . Allergies Sister   . Allergies Brother   . Diabetes Brother   . Heart failure Neg Hx     BP (!) 149/72   Pulse (!) 58   Ht 5\' 4"  (1.626 m)   Wt 235 lb (106.6 kg)   BMI 40.34 kg/m   Review of Systems: See HPI above.    Objective:  Physical Exam:  Gen: NAD, comfortable in exam room.  Right knee: No gross deformity, ecchymoses, effusion. TTP medial joint line.  No other tenderness. FROM with 5/5 strength. Negative ant/post drawers. Negative valgus/varus testing. Negative lachmanns. Negative mcmurrays, apleys, patellar apprehension. NV intact distally.  Left knee: No gross deformity, ecchymoses, swelling. No TTP. FROM with 5/5 strength. Negative ant/post drawers. Negative valgus/varus testing. NV intact distally.    Assessment & Plan:  1. Right knee pain - 2/2 DJD.  Repeated cortisone injection today.  Did well with viscosupplementation in past if this doesn't provide enough relief.  Reviewed tylenol,, topical medications, supplements that may help.  Call us in 1-2 weeks if cortisone hasn't provided relief and will go ahead with orthovisc series.  After informed written consent timeout was performed, patient was seated on exam table. Right knee was prepped with alcohol swab and utilizing anterolateral approach, patient's right knee was injected intraarticularly with 3:1 bupivicaine: depomedrol. Patient tolerated the procedure well without immediate complications.  Note similar prep done prior to this anteromedial side but on needle approach hit bone, redirected once with same issue, and aborted procedure in favor  of lateral approach.

## 2017-08-25 NOTE — Patient Instructions (Signed)
Your pain is due to arthritis. These are the different medications you can take for this: Tylenol 500mg  1-2 tabs three times a day for pain. Capsaicin, aspercreme, or biofreeze topically up to four times a day may also help with pain. Some supplements that may help for arthritis: Boswellia extract, curcumin, pycnogenol Cortisone injections are an option - you were given this today. If cortisone injections do not help, there are different types of shots that may help but they take longer to take effect (the gel shots). It's important that you continue to stay active. Straight leg raises, knee extensions 3 sets of 10 once a day (add ankle weight if these become too easy). Consider physical therapy to strengthen muscles around the joint that hurts to take pressure off of the joint itself. Shoe inserts with good arch support may be helpful. Heat or ice 15 minutes at a time 3-4 times a day as needed to help with pain. Water aerobics and cycling with low resistance are the best two types of exercise for arthritis though any exercise is ok as long as it doesn't worsen the pain. Follow up with me as needed if you're doing well - if after 1-2 weeks the cortisone shot hasn't helped you enough give me a call and we can get approval for orthovisc.

## 2017-08-25 NOTE — Assessment & Plan Note (Addendum)
2/2 DJD.  Repeated cortisone injection today.  Did well with viscosupplementation in past if this doesn't provide enough relief.  Reviewed tylenol,, topical medications, supplements that may help.  Call us in 1-2 weeks if cortisone hasn't provided relief and will go ahead with orthovisc series.  After informed written consent timeout was performed, patient was seated on exam table. Right knee was prepped with alcohol swab and utilizing anterolateral approach, patient's right knee was injected intraarticularly with 3:1 bupivicaine: depomedrol. Patient tolerated the procedure well without immediate complications.  Note similar prep done prior to this anteromedial side but on needle approach hit bone, redirected once with same issue, and aborted procedure in favor of lateral approach.

## 2017-08-27 ENCOUNTER — Other Ambulatory Visit: Payer: Self-pay | Admitting: Cardiovascular Disease

## 2017-09-01 ENCOUNTER — Telehealth: Payer: Self-pay | Admitting: Family Medicine

## 2017-09-01 NOTE — Telephone Encounter (Signed)
Ok to go ahead with approval process.  Thanks!

## 2017-09-01 NOTE — Telephone Encounter (Signed)
Spoke to patient and told her that I would go ahead with the approval process for the gel shots.

## 2017-09-01 NOTE — Telephone Encounter (Signed)
Patient states she does not feel any better after the cortisone injection last Monday. She would like to go ahead with the gel shots if possible

## 2017-09-09 ENCOUNTER — Ambulatory Visit (INDEPENDENT_AMBULATORY_CARE_PROVIDER_SITE_OTHER): Payer: Medicare Other | Admitting: Family Medicine

## 2017-09-09 ENCOUNTER — Encounter: Payer: Self-pay | Admitting: Family Medicine

## 2017-09-09 DIAGNOSIS — M170 Bilateral primary osteoarthritis of knee: Secondary | ICD-10-CM

## 2017-09-10 ENCOUNTER — Encounter: Payer: Self-pay | Admitting: Family Medicine

## 2017-09-10 NOTE — Progress Notes (Signed)
PCP and consultation requested by: [name], MD  Subjective:   HPI: Patient is a 76 y.o. female here for bilateral knee pain.  3/8: Patient reports she's had off and on pain in left knee and left hip that worsened after coming off her prednisone (for IgA nephropathy). Was on this for two years and just off past two months. Pain in left knee is mainly posterior, 0/10 at rest, up to 10/10 and sharp at times. Worse with ambulation. Has a history of a bakers cyst here. Hip pain is lateral, worse and sharp when lying down on this side. Pain here 0/10 at rest, 7/10 at worst. No skin changes, fever, other complaints.  4/7: Patient reports she feels much better. No pain with walking in knee or hip. Pain level 0/10. Some soreness only to touch in these areas and if lying on the left side. No numbness, skin changes.  6/28: Patient reports both of her knees are hurting now. Pain level is 0/10 at rest, up to 8/10 and very stiff, achy with walking. Using a cane. Most pain anterior knees - radiates some posteriorly. She has pain in ankles as well anteriorly. No numbness, skin changes.  8/23: Patient reports she is doing well today. Had a bad day a couple PT sessions ago. Done about 10 visits so far with PT and made good progress. Doing water aerobics. Using a cane. Not taking any medications - concerned about her kidneys being affected by any medication - has not discussed with nephrologist yet. Pain level 0/10 today. Gets soreness of knees primarily with ambulation. No skin changes, numbness.  12/19: Patient reports her right knee has started bothering her - giving out, pain is 2/10 at rest, dull and anterior. Interested in cortisone shot for knee and getting approval for viscosupplementation. Pain on both hips when lying on sides though left side is worse. No radiation of pain. Not doing home exercises as much as she should. Not taking anything for pain. No skin  changes, numbness.  11/15/16: Patient returns to start viscosupplementation in both knees. Pain currently 0/10. No skin changes.  11/22/16: Patient returns for supartz. She's doing better following injections. No skin changes, other complaints.  11/29/16: Patient reports she is doing very well. No pain currently. No skin changes, numbness.  10/22: Patient returns with worsening anterior right knee pain over past 2-3 months. No new injury or trauma. Pain is 5/10 and sharp, worse with walking. Worse medially also. No skin changes, numbness.  11/6: Patient returns to start orthovisc series. Pain level 1/10 left, 0/10 on right. No skin changes.  Past Medical History:  Diagnosis Date  . Allergy   . Arthritis    "knees, hips, back, hands" (11/08/2013)  . Asthma   . Basal cell carcinoma    "cut them off face & right shoulder" (11/08/2013)  . CAD (coronary artery disease), non obstructive by cardiac cath 12/12/13 01/24/2014  . CKD (chronic kidney disease), stage III ([place])   . Diastolic CHF ([place])   . DJD (degenerative joint disease)   . Gastric ulcer   . GERD (gastroesophageal reflux disease)   . Hiatal hernia   . History of steroid induced diabetes   . Hyperlipidemia    "don't take RX for it" (11/08/2013)  . Hypertension   . Hypothyroid   . Lung mass    superior segment LLL/notes 11/08/2013  . Lung nodule/ adenoca > LLobectomy 12/15/13  11/08/2013   PET 11/23/2013 1. Dominant sub solid left lower  lobe nodule does not demonstrate significantly increased metabolic activity. However, morphologically, adenocarcinoma is still a concern.   - Spirometry 11/24/13 wnl  - 11/24/2013 T surg eval rec> LLower lobectomy 12/15/2013 for adenoca   . Nephropathy   . Osteoporosis   . Pneumonia    "I've had it ?3 times" (11/08/2013)  . Renal artery stenosis ([facility])    with stent placement    Current Outpatient Medications on File Prior to Visit  Medication Sig Dispense Refill  . acetaminophen (TYLENOL) 500  MG tablet Take 500 mg by mouth 3 (three) times daily.    Marland Kitchen albuterol (PROVENTIL HFA;VENTOLIN HFA) 108 (90 BASE) MCG/ACT inhaler Inhale 1-2 puffs into the lungs every 6 (six) hours as needed for wheezing or shortness of breath. 18 g 1  . allopurinol (ZYLOPRIM) 100 MG tablet TAKE 2 TABLETS BY MOUTH IN THE MORNING 180 tablet 0  . amLODipine-olmesartan (AZOR) 5-40 MG tablet Take by mouth.    . Calcium Carb-Cholecalciferol 600-800 MG-UNIT TABS Take 2 tablets by mouth daily.     . cetirizine (ZYRTEC) 10 MG tablet Take 10 mg by mouth at bedtime.     Marland Kitchen Fesoterodine Fumarate (TOVIAZ PO) Take by mouth.    . fluticasone (FLONASE) 50 MCG/ACT nasal spray USE TWO SPRAY(S) IN EACH NOSTRIL ONCE DAILY 16 g 2  . furosemide (LASIX) 40 MG tablet TAKE 1 TABLET (40 MG TOTAL) BY MOUTH 2 (TWO) TIMES DAILY. 180 tablet 1  . glucose blood (ONE TOUCH ULTRA TEST) test strip Dx E09.9. Please use one strip each time sugars are checked. Pt tests 2 times daily. 100 each 6  . hydrALAZINE (APRESOLINE) 50 MG tablet Take 1 tablet (50 mg total) by mouth 2 (two) times daily. 180 tablet 1  . Lancets (ONETOUCH ULTRASOFT) lancets Use 1 per day dx code 249.00 100 each 12  . lansoprazole (PREVACID) 15 MG capsule Take 15 mg by mouth daily at 12 noon.    Marland Kitchen levothyroxine (SYNTHROID, LEVOTHROID) 88 MCG tablet TAKE 1 TABLET BY MOUTH DAILY BEFORE BREAKFAST  90 tablet 0  . meclizine (ANTIVERT) 25 MG tablet Take 1 tablet (25 mg total) by mouth 3 (three) times daily as needed for dizziness. 45 tablet 0  . nebivolol (BYSTOLIC) 10 MG tablet TAKE 1 TABLET (10 MG TOTAL) BY MOUTH DAILY.    Marland Kitchen ONETOUCH DELICA LANCETS 87F MISC Use to check blood sugar once daily as instructed. Dx code: E11.9 100 each 2  . sertraline (ZOLOFT) 50 MG tablet TAKE 1 AND 1/2 TABLETS BY MOUTH ONCE A DAY  126 tablet 0   No current facility-administered medications on file prior to visit.     Past Surgical History:  Procedure Laterality Date  . ANTERIOR CERVICAL  DECOMP/DISCECTOMY FUSION  2000   C5-C6  . CARDIAC CATHETERIZATION     Hx: of 2/ 2015  . CHOLECYSTECTOMY  1970's  . CORONARY ANGIOGRAM     Hx: of 2/ 2015  . DILATION AND CURETTAGE OF UTERUS  1960's  . KNEE ARTHROSCOPY Bilateral   . RENAL ARTERY STENT Right 11/08/2013   Archie Endo 11/08/2013  . SKIN CANCER EXCISION    . TONSILLECTOMY AND ADENOIDECTOMY  ?1946  . VAGINAL HYSTERECTOMY  1970    Allergies  Allergen Reactions  . Benazepril Hcl Anaphylaxis and Cough  . Loratadine Anaphylaxis    anaphylaxis  . Allegra [Fexofenadine Hcl] Other (See Comments)    Severe back pain  . Celecoxib Other (See Comments)    REACTION: aching  . Fexofenadine  Other (See Comments)    Severe back pain  . Lisinopril     REACTION: cough  . Sulfa Antibiotics Hives  . Sulfur Rash    Social History   Socioeconomic History  . Marital status: Married    Spouse name: Not on file  . Number of children: Not on file  . Years of education: Not on file  . Highest education level: Not on file  Social Needs  . Financial resource strain: Not on file  . Food insecurity - worry: Not on file  . Food insecurity - inability: Not on file  . Transportation needs - medical: Not on file  . Transportation needs - non-medical: Not on file  Occupational History  . Occupation: Retired  Tobacco Use  . Smoking status: Never Smoker  . Smokeless tobacco: Never Used  Substance and Sexual Activity  . Alcohol use: No    Alcohol/week: 0.0 oz  . Drug use: No  . Sexual activity: Yes  Other Topics Concern  . Not on file  Social History Narrative  . Not on file    Family History  Problem Relation Age of Onset  . Breast cancer Sister   . Heart disease Mother   . Emphysema Father        smoked  . Heart disease Father   . Diabetes Father   . Stroke Father   . Thyroid disease Sister   . Allergies Sister   . Allergies Brother   . Diabetes Brother   . Heart failure Neg Hx     BP 134/79   Pulse 60   Ht 5\' 4"  (1.626  m)   Wt 235 lb (106.6 kg)   BMI 40.34 kg/m   Review of Systems: See HPI above.    Objective:  Physical Exam:  Exam not repeated today. Gen: NAD, comfortable in exam room.  Right knee: No gross deformity, ecchymoses, effusion. TTP medial joint line.  No other tenderness. FROM with 5/5 strength. Negative ant/post drawers. Negative valgus/varus testing. Negative lachmanns. Negative mcmurrays, apleys, patellar apprehension. NV intact distally.  Left knee: No gross deformity, ecchymoses, swelling. No TTP. FROM with 5/5 strength. Negative ant/post drawers. Negative valgus/varus testing. NV intact distally.    Assessment & Plan:  1. Bilateral knee pain - 2/2 DJD.  Cortisone injection without much benefit after last visit.  Started orthovisc series.  Follow up in 1 week for second injections.  After informed written consent timeout was performed, patient was seated on exam table. Right knee was prepped with alcohol swab and utilizing anteromedial approach, patient's right knee was injected intraarticularly with 66mL bupivicaine followed by orthovisc. Patient tolerated the procedure well without immediate complications.  After informed written consent timeout was performed, patient was seated on exam table. Left knee was prepped with alcohol swab and utilizing anterolateral approach, patient's left knee was injected intraarticularly with 42mL bupivicaine followed by orthovisc. Patient tolerated the procedure well without immediate complications.

## 2017-09-10 NOTE — Assessment & Plan Note (Signed)
Cortisone injection without much benefit after last visit.  Started orthovisc series.  Follow up in 1 week for second injections.  After informed written consent timeout was performed, patient was seated on exam table. Right knee was prepped with alcohol swab and utilizing anteromedial approach, patient's right knee was injected intraarticularly with 26mL bupivicaine followed by orthovisc. Patient tolerated the procedure well without immediate complications.  After informed written consent timeout was performed, patient was seated on exam table. Left knee was prepped with alcohol swab and utilizing anterolateral approach, patient's left knee was injected intraarticularly with 27mL bupivicaine followed by orthovisc. Patient tolerated the procedure well without immediate complications.

## 2017-09-15 ENCOUNTER — Other Ambulatory Visit: Payer: Self-pay | Admitting: Family Medicine

## 2017-09-16 ENCOUNTER — Encounter: Payer: Self-pay | Admitting: Family Medicine

## 2017-09-16 ENCOUNTER — Ambulatory Visit (INDEPENDENT_AMBULATORY_CARE_PROVIDER_SITE_OTHER): Payer: Medicare Other | Admitting: Family Medicine

## 2017-09-16 DIAGNOSIS — M170 Bilateral primary osteoarthritis of knee: Secondary | ICD-10-CM

## 2017-09-17 ENCOUNTER — Encounter: Payer: Self-pay | Admitting: Family Medicine

## 2017-09-17 NOTE — Assessment & Plan Note (Signed)
Cortisone injection without much benefit recently.  Second orthovisc injections given today bilaterally.  After informed written consent timeout was performed, patient was seated on exam table. Right knee was prepped with alcohol swab and utilizing anteromedial approach, patient's right knee was injected intraarticularly with 35mL bupivicaine followed by orthovisc. Patient tolerated the procedure well without immediate complications.  After informed written consent timeout was performed, patient was seated on exam table. Left knee was prepped with alcohol swab and utilizing anterolateral approach, patient's left knee was injected intraarticularly with 76mL bupivicaine followed by orthovisc. Patient tolerated the procedure well without immediate complications.

## 2017-09-17 NOTE — Progress Notes (Signed)
PCP and consultation requested by: [name], MD  Subjective:   HPI: Patient is a 76 y.o. female here for bilateral knee pain.  3/8: Patient reports she's had off and on pain in left knee and left hip that worsened after coming off her prednisone (for IgA nephropathy). Was on this for two years and just off past two months. Pain in left knee is mainly posterior, 0/10 at rest, up to 10/10 and sharp at times. Worse with ambulation. Has a history of a bakers cyst here. Hip pain is lateral, worse and sharp when lying down on this side. Pain here 0/10 at rest, 7/10 at worst. No skin changes, fever, other complaints.  4/7: Patient reports she feels much better. No pain with walking in knee or hip. Pain level 0/10. Some soreness only to touch in these areas and if lying on the left side. No numbness, skin changes.  6/28: Patient reports both of her knees are hurting now. Pain level is 0/10 at rest, up to 8/10 and very stiff, achy with walking. Using a cane. Most pain anterior knees - radiates some posteriorly. She has pain in ankles as well anteriorly. No numbness, skin changes.  8/23: Patient reports she is doing well today. Had a bad day a couple PT sessions ago. Done about 10 visits so far with PT and made good progress. Doing water aerobics. Using a cane. Not taking any medications - concerned about her kidneys being affected by any medication - has not discussed with nephrologist yet. Pain level 0/10 today. Gets soreness of knees primarily with ambulation. No skin changes, numbness.  12/19: Patient reports her right knee has started bothering her - giving out, pain is 2/10 at rest, dull and anterior. Interested in cortisone shot for knee and getting approval for viscosupplementation. Pain on both hips when lying on sides though left side is worse. No radiation of pain. Not doing home exercises as much as she should. Not taking anything for pain. No skin  changes, numbness.  11/15/16: Patient returns to start viscosupplementation in both knees. Pain currently 0/10. No skin changes.  11/22/16: Patient returns for supartz. She's doing better following injections. No skin changes, other complaints.  11/29/16: Patient reports she is doing very well. No pain currently. No skin changes, numbness.  10/22: Patient returns with worsening anterior right knee pain over past 2-3 months. No new injury or trauma. Pain is 5/10 and sharp, worse with walking. Worse medially also. No skin changes, numbness.  11/6: Patient returns to start orthovisc series. Pain level 1/10 left, 0/10 on right. No skin changes.  11/13: Patient reports she's doing well. Pain right knee 3/10 today, 0/10 on left. No skin changes.  Past Medical History:  Diagnosis Date  . Allergy   . Arthritis    "knees, hips, back, hands" (11/08/2013)  . Asthma   . Basal cell carcinoma    "cut them off face & right shoulder" (11/08/2013)  . CAD (coronary artery disease), non obstructive by cardiac cath 12/12/13 01/24/2014  . CKD (chronic kidney disease), stage III ([place])   . Diastolic CHF ([place])   . DJD (degenerative joint disease)   . Gastric ulcer   . GERD (gastroesophageal reflux disease)   . Hiatal hernia   . History of steroid induced diabetes   . Hyperlipidemia    "don't take RX for it" (11/08/2013)  . Hypertension   . Hypothyroid   . Lung mass    superior segment LLL/notes 11/08/2013  .  Lung nodule/ adenoca > LLobectomy 12/15/13  11/08/2013   PET 11/23/2013 1. Dominant sub solid left lower lobe nodule does not demonstrate significantly increased metabolic activity. However, morphologically, adenocarcinoma is still a concern.   - Spirometry 11/24/13 wnl  - 11/24/2013 T surg eval rec> LLower lobectomy 12/15/2013 for adenoca   . Nephropathy   . Osteoporosis   . Pneumonia    "I've had it ?3 times" (11/08/2013)  . Renal artery stenosis ([facility])    with stent placement    Current  Outpatient Medications on File Prior to Visit  Medication Sig Dispense Refill  . acetaminophen (TYLENOL) 500 MG tablet Take 500 mg by mouth 3 (three) times daily.    Marland Kitchen albuterol (PROVENTIL HFA;VENTOLIN HFA) 108 (90 BASE) MCG/ACT inhaler Inhale 1-2 puffs into the lungs every 6 (six) hours as needed for wheezing or shortness of breath. 18 g 1  . allopurinol (ZYLOPRIM) 100 MG tablet TAKE 2 TABLETS BY MOUTH IN THE MORNING 180 tablet 0  . amLODipine-olmesartan (AZOR) 5-40 MG tablet Take by mouth.    . Calcium Carb-Cholecalciferol 600-800 MG-UNIT TABS Take 2 tablets by mouth daily.     . cetirizine (ZYRTEC) 10 MG tablet Take 10 mg by mouth at bedtime.     Marland Kitchen Fesoterodine Fumarate (TOVIAZ PO) Take by mouth.    . fluticasone (FLONASE) 50 MCG/ACT nasal spray USE TWO SPRAY(S) IN EACH NOSTRIL ONCE DAILY 16 g 2  . furosemide (LASIX) 40 MG tablet TAKE 1 TABLET (40 MG TOTAL) BY MOUTH 2 (TWO) TIMES DAILY. 180 tablet 1  . glucose blood (ONE TOUCH ULTRA TEST) test strip Dx E09.9. Please use one strip each time sugars are checked. Pt tests 2 times daily. 100 each 6  . hydrALAZINE (APRESOLINE) 50 MG tablet Take 1 tablet (50 mg total) by mouth 2 (two) times daily. 180 tablet 1  . Lancets (ONETOUCH ULTRASOFT) lancets Use 1 per day dx code 249.00 100 each 12  . lansoprazole (PREVACID) 15 MG capsule Take 15 mg by mouth daily at 12 noon.    Marland Kitchen levothyroxine (SYNTHROID, LEVOTHROID) 88 MCG tablet TAKE 1 TABLET BY MOUTH DAILY BEFORE BREAKFAST  90 tablet 0  . meclizine (ANTIVERT) 25 MG tablet Take 1 tablet (25 mg total) by mouth 3 (three) times daily as needed for dizziness. 45 tablet 0  . nebivolol (BYSTOLIC) 10 MG tablet TAKE 1 TABLET (10 MG TOTAL) BY MOUTH DAILY.    Marland Kitchen ONETOUCH DELICA LANCETS 46N MISC Use to check blood sugar once daily as instructed. Dx code: E11.9 100 each 2  . sertraline (ZOLOFT) 50 MG tablet TAKE ONE AND ONE-HALF TABLETS BY MOUTH DAILY  135 tablet 1   No current facility-administered medications on  file prior to visit.     Past Surgical History:  Procedure Laterality Date  . ANTERIOR CERVICAL DECOMP/DISCECTOMY FUSION  2000   C5-C6  . CARDIAC CATHETERIZATION     Hx: of 2/ 2015  . CHOLECYSTECTOMY  1970's  . CORONARY ANGIOGRAM     Hx: of 2/ 2015  . DILATION AND CURETTAGE OF UTERUS  1960's  . KNEE ARTHROSCOPY Bilateral   . RENAL ARTERY STENT Right 11/08/2013   Archie Endo 11/08/2013  . SKIN CANCER EXCISION    . TONSILLECTOMY AND ADENOIDECTOMY  ?1946  . VAGINAL HYSTERECTOMY  1970    Allergies  Allergen Reactions  . Benazepril Hcl Anaphylaxis and Cough  . Loratadine Anaphylaxis    anaphylaxis  . Allegra [Fexofenadine Hcl] Other (See Comments)    Severe  back pain  . Celecoxib Other (See Comments)    REACTION: aching  . Fexofenadine Other (See Comments)    Severe back pain  . Lisinopril     REACTION: cough  . Sulfa Antibiotics Hives  . Sulfur Rash    Social History   Socioeconomic History  . Marital status: Married    Spouse name: Not on file  . Number of children: Not on file  . Years of education: Not on file  . Highest education level: Not on file  Social Needs  . Financial resource strain: Not on file  . Food insecurity - worry: Not on file  . Food insecurity - inability: Not on file  . Transportation needs - medical: Not on file  . Transportation needs - non-medical: Not on file  Occupational History  . Occupation: Retired  Tobacco Use  . Smoking status: Never Smoker  . Smokeless tobacco: Never Used  Substance and Sexual Activity  . Alcohol use: No    Alcohol/week: 0.0 oz  . Drug use: No  . Sexual activity: Yes  Other Topics Concern  . Not on file  Social History Narrative  . Not on file    Family History  Problem Relation Age of Onset  . Breast cancer Sister   . Heart disease Mother   . Emphysema Father        smoked  . Heart disease Father   . Diabetes Father   . Stroke Father   . Thyroid disease Sister   . Allergies Sister   . Allergies  Brother   . Diabetes Brother   . Heart failure Neg Hx     BP (!) 155/85   Pulse (!) 57   Ht 5\' 4"  (1.626 m)   Wt 230 lb (104.3 kg)   BMI 39.48 kg/m   Review of Systems: See HPI above.    Objective:  Physical Exam:  Exam not repeated today. Gen: NAD, comfortable in exam room.  Right knee: No gross deformity, ecchymoses, effusion. TTP medial joint line.  No other tenderness. FROM with 5/5 strength. Negative ant/post drawers. Negative valgus/varus testing. Negative lachmanns. Negative mcmurrays, apleys, patellar apprehension. NV intact distally.  Left knee: No gross deformity, ecchymoses, swelling. No TTP. FROM with 5/5 strength. Negative ant/post drawers. Negative valgus/varus testing. NV intact distally.    Assessment & Plan:  1. Bilateral knee pain - 2/2 DJD.  Cortisone injection without much benefit recently.  Second orthovisc injections given today bilaterally.  After informed written consent timeout was performed, patient was seated on exam table. Right knee was prepped with alcohol swab and utilizing anteromedial approach, patient's right knee was injected intraarticularly with 23mL bupivicaine followed by orthovisc. Patient tolerated the procedure well without immediate complications.  After informed written consent timeout was performed, patient was seated on exam table. Left knee was prepped with alcohol swab and utilizing anterolateral approach, patient's left knee was injected intraarticularly with 72mL bupivicaine followed by orthovisc. Patient tolerated the procedure well without immediate complications.

## 2017-09-19 ENCOUNTER — Encounter: Payer: Self-pay | Admitting: Family Medicine

## 2017-09-19 ENCOUNTER — Other Ambulatory Visit: Payer: Self-pay

## 2017-09-19 ENCOUNTER — Ambulatory Visit (INDEPENDENT_AMBULATORY_CARE_PROVIDER_SITE_OTHER): Payer: Medicare Other | Admitting: Family Medicine

## 2017-09-19 VITALS — BP 128/86 | HR 53 | Temp 98.5°F | Resp 16 | Ht 64.0 in | Wt 236.2 lb

## 2017-09-19 DIAGNOSIS — I701 Atherosclerosis of renal artery: Secondary | ICD-10-CM | POA: Diagnosis not present

## 2017-09-19 DIAGNOSIS — E039 Hypothyroidism, unspecified: Secondary | ICD-10-CM | POA: Diagnosis not present

## 2017-09-19 DIAGNOSIS — I10 Essential (primary) hypertension: Secondary | ICD-10-CM

## 2017-09-19 DIAGNOSIS — M79671 Pain in right foot: Secondary | ICD-10-CM

## 2017-09-19 DIAGNOSIS — E785 Hyperlipidemia, unspecified: Secondary | ICD-10-CM | POA: Diagnosis not present

## 2017-09-19 DIAGNOSIS — M79672 Pain in left foot: Secondary | ICD-10-CM | POA: Diagnosis not present

## 2017-09-19 NOTE — Patient Instructions (Signed)
Follow up in 6 months to recheck BP and cholesterol and schedule your Medicare Wellness Visit at the same time [facility] notify you of your lab results and make any changes if needed Continue to work on healthy diet and regular exercise- you can do it!!! Call with any questions or concerns Happy Thanksgiving!!!!

## 2017-09-19 NOTE — Progress Notes (Signed)
   Subjective:    Patient ID: Susan Pittman, female    DOB: [birth year], 76 y.o.   MRN: [id number]  HPI HTN- chronic problem, on Hydralazine 50mg , Bystolic 10mg , Amlodipine-Olmesartan 5/40mg  daily w/ good control.  No CP, SOB above baseline, HAs, visual changes, edema.  Hyperlipidemia- attempting to control w/ diet and exercise but pt has gained 6 lbs.  No abd pain, N/V.  Hypothyroid- chronic problem, on Levothyroxine 29mcg daily.  Pt reports energy level is 'pretty good'.  Obesity- pt has gained 6 lbs since last visit.  BMI is 40.55  Pt does water aerobics 3x/week.  Started Weight Watchers 3 weeks ago   Review of Systems For ROS see HPI     Objective:   Physical Exam  Constitutional: She is oriented to person, place, and time. She appears well-developed and well-nourished. No distress.  obese  HENT:  Head: Normocephalic and atraumatic.  Eyes: Conjunctivae and EOM are normal. Pupils are equal, round, and reactive to light.  Neck: Normal range of motion. Neck supple. No thyromegaly present.  Cardiovascular: Normal rate, regular rhythm, normal heart sounds and intact distal pulses.  No murmur heard. Pulmonary/Chest: Effort normal and breath sounds normal. No respiratory distress.  Abdominal: Soft. She exhibits no distension. There is no tenderness.  Musculoskeletal: She exhibits no edema.  Lymphadenopathy:    She has no cervical adenopathy.  Neurological: She is alert and oriented to person, place, and time.  Skin: Skin is warm and dry.  Psychiatric: She has a normal mood and affect. Her behavior is normal.  Vitals reviewed.         Assessment & Plan:

## 2017-09-20 LAB — LIPID PANEL
CHOL/HDL RATIO: 3.3 (calc) (ref <5.0)
CHOLESTEROL: 171 mg/dL (ref <200)
HDL: 52 mg/dL (ref >50)
LDL Cholesterol (Calc): 89 mg/dL (calc)
Non-HDL Cholesterol (Calc): 119 mg/dL (calc) (ref <130)
Triglycerides: 207 mg/dL — ABNORMAL HIGH (ref <150)

## 2017-09-20 LAB — CBC WITH DIFFERENTIAL/PLATELET
Basophils Absolute: 62 cells/uL (ref 0–200)
Basophils Relative: 0.6 %
EOS ABS: 144 {cells}/uL (ref 15–500)
Eosinophils Relative: 1.4 %
HEMATOCRIT: 34.5 % — AB (ref 35.0–45.0)
Hemoglobin: 10.9 g/dL — ABNORMAL LOW (ref 11.7–15.5)
LYMPHS ABS: 1895 {cells}/uL (ref 850–3900)
MCH: 25.4 pg — AB (ref 27.0–33.0)
MCHC: 31.6 g/dL — ABNORMAL LOW (ref 32.0–36.0)
MCV: 80.4 fL (ref 80.0–100.0)
MPV: 11.7 fL (ref 7.5–12.5)
Monocytes Relative: 6.5 %
NEUTROS PCT: 73.1 %
Neutro Abs: 7529 cells/uL (ref 1500–7800)
Platelets: 342 10*3/uL (ref 140–400)
RBC: 4.29 10*6/uL (ref 3.80–5.10)
RDW: 15.9 % — AB (ref 11.0–15.0)
TOTAL LYMPHOCYTE: 18.4 %
WBC: 10.3 10*3/uL (ref 3.8–10.8)
WBCMIX: 670 {cells}/uL (ref 200–950)

## 2017-09-20 LAB — BASIC METABOLIC PANEL
BUN / CREAT RATIO: 35 (calc) — AB (ref 6–22)
BUN: 55 mg/dL — AB (ref 7–25)
CHLORIDE: 103 mmol/L (ref 98–110)
CO2: 23 mmol/L (ref 20–32)
CREATININE: 1.58 mg/dL — AB (ref 0.60–0.93)
Calcium: 9.2 mg/dL (ref 8.6–10.4)
Glucose, Bld: 101 mg/dL — ABNORMAL HIGH (ref 65–99)
POTASSIUM: 4.4 mmol/L (ref 3.5–5.3)
SODIUM: 137 mmol/L (ref 135–146)

## 2017-09-20 LAB — HEPATIC FUNCTION PANEL
AG RATIO: 1.7 (calc) (ref 1.0–2.5)
ALBUMIN MSPROF: 4 g/dL (ref 3.6–5.1)
ALT: 8 U/L (ref 6–29)
AST: 17 U/L (ref 10–35)
Alkaline phosphatase (APISO): 90 U/L (ref 33–130)
BILIRUBIN DIRECT: 0 mg/dL (ref 0.0–0.2)
BILIRUBIN INDIRECT: 0.3 mg/dL (ref 0.2–1.2)
BILIRUBIN TOTAL: 0.3 mg/dL (ref 0.2–1.2)
GLOBULIN: 2.4 g/dL (ref 1.9–3.7)
Total Protein: 6.4 g/dL (ref 6.1–8.1)

## 2017-09-20 LAB — TSH: TSH: 0.74 mIU/L (ref 0.40–4.50)

## 2017-09-22 NOTE — Assessment & Plan Note (Signed)
Chronic problem.  Attempting to control w/ diet and exercise.  Check labs and adjust tx plan prn.

## 2017-09-22 NOTE — Assessment & Plan Note (Signed)
Chronic problem.  Currently well controlled.  Asymptomatic.  Check labs.  No anticipated med changes.  Will follow. 

## 2017-09-22 NOTE — Assessment & Plan Note (Signed)
Pt has gained 6 lbs since last visit and BMI is now >40.  She is doing water aerobics and just started Weight Watchers as she is aware she needs to lose weight.  Will follow.

## 2017-09-22 NOTE — Assessment & Plan Note (Signed)
Chronic problem.  Currently asymptomatic.  Check labs.  Adjust meds prn  

## 2017-09-23 ENCOUNTER — Encounter: Payer: Self-pay | Admitting: Family Medicine

## 2017-09-23 ENCOUNTER — Other Ambulatory Visit: Payer: Self-pay | Admitting: Family Medicine

## 2017-09-23 ENCOUNTER — Ambulatory Visit (INDEPENDENT_AMBULATORY_CARE_PROVIDER_SITE_OTHER): Payer: Medicare Other | Admitting: Cardiovascular Disease

## 2017-09-23 ENCOUNTER — Ambulatory Visit (INDEPENDENT_AMBULATORY_CARE_PROVIDER_SITE_OTHER): Payer: Medicare Other | Admitting: Family Medicine

## 2017-09-23 ENCOUNTER — Encounter: Payer: Self-pay | Admitting: Cardiovascular Disease

## 2017-09-23 VITALS — BP 124/62 | HR 61 | Ht 64.0 in | Wt 233.0 lb

## 2017-09-23 DIAGNOSIS — I701 Atherosclerosis of renal artery: Secondary | ICD-10-CM | POA: Diagnosis not present

## 2017-09-23 DIAGNOSIS — I10 Essential (primary) hypertension: Secondary | ICD-10-CM

## 2017-09-23 DIAGNOSIS — D582 Other hemoglobinopathies: Secondary | ICD-10-CM

## 2017-09-23 DIAGNOSIS — M170 Bilateral primary osteoarthritis of knee: Secondary | ICD-10-CM

## 2017-09-23 MED ORDER — CARVEDILOL 6.25 MG PO TABS: 6.2500 mg | ORAL_TABLET | Freq: Two times a day (BID) | ORAL | 3 refills | Status: AC

## 2017-09-23 NOTE — Progress Notes (Signed)
Cardiology Office Note   Date:  09/24/2017   ID:  Susan Pittman, DOB 06-Sep-1941, MRN [id number]  PCP:  [name], MD  Cardiologist:   Kathlyn Sacramento, MD   No chief complaint on file.     History of Present Illness: Susan Pittman is a 76 y.o. female who presents for a follow up visit regarding history of renal artery stenting.  She has a long-standing history of hypertension  hypothyroidism, remote peptic ulcer disease in the 1980s, as well as GERD.  She had right renal artery stenting in January 2015. She is known to have chronic kidney disease due to IgA nephropathy. Previous cardiac catheterization in 2015 showed  mild nonobstructive CAD with smooth 20% RCA narrowing.  Ejection fraction was 65%.   She was diagnosed with stage I lung cancer in 2015 and underwent  mini thoracotomy with lobectomy of the left lower lobe with lymph node dissection for adenocarcinoma.   During last visit, she had dizziness with mild bradycardia.  The dose of Bystolic was decreased.  She had a 48-hour Holter monitor which overall was unremarkable.  Her dizziness ultimately improved with meclizine with no recurrent symptoms. She has been doing well and denies any chest pain, shortness of breath or palpitations.  She is having hard time affording Bystolic.  Past Medical History:  Diagnosis Date  . Allergy   . Arthritis    "knees, hips, back, hands" (11/08/2013)  . Asthma   . Basal cell carcinoma    "cut them off face & right shoulder" (11/08/2013)  . CAD (coronary artery disease), non obstructive by cardiac cath 12/12/13 01/24/2014  . CKD (chronic kidney disease), stage III ([place])   . Diastolic CHF ([place])   . DJD (degenerative joint disease)   . Gastric ulcer   . GERD (gastroesophageal reflux disease)   . Hiatal hernia   . History of steroid induced diabetes   . Hyperlipidemia    "don't take RX for it" (11/08/2013)  . Hypertension   . Hypothyroid   . Lung mass    superior segment  LLL/notes 11/08/2013  . Lung nodule/ adenoca > LLobectomy 12/15/13  11/08/2013   PET 11/23/2013 1. Dominant sub solid left lower lobe nodule does not demonstrate significantly increased metabolic activity. However, morphologically, adenocarcinoma is still a concern.   - Spirometry 11/24/13 wnl  - 11/24/2013 T surg eval rec> LLower lobectomy 12/15/2013 for adenoca   . Nephropathy   . Osteoporosis   . Pneumonia    "I've had it ?3 times" (11/08/2013)  . Renal artery stenosis ([place])    with stent placement    Past Surgical History:  Procedure Laterality Date  . ANTERIOR CERVICAL DECOMP/DISCECTOMY FUSION  2000   C5-C6  . CARDIAC CATHETERIZATION     Hx: of 2/ 2015  . CHOLECYSTECTOMY  1970's  . CORONARY ANGIOGRAM     Hx: of 2/ 2015  . DILATION AND CURETTAGE OF UTERUS  1960's  . KNEE ARTHROSCOPY Bilateral   . LEFT HEART CATHETERIZATION WITH CORONARY ANGIOGRAM N/A 12/13/2013   Procedure: LEFT HEART CATHETERIZATION WITH CORONARY ANGIOGRAM;  Surgeon: Burnell Blanks, MD;  Location: [facility] CATH LAB;  Service: Cardiovascular;  Laterality: N/A;  . LOBECTOMY Left 12/15/2013   Procedure: LOBECTOMY;  Surgeon: Grace Isaac, MD;  Location: [place];  Service: Thoracic;  Laterality: Left;  . PERCUTANEOUS STENT INTERVENTION Right 11/08/2013   Procedure: PERCUTANEOUS STENT INTERVENTION;  Surgeon: Lorretta Harp, MD;  Location[facility] CATH LAB;  Service: Cardiovascular;  Laterality: Right;  rt renal artery stent  . RENAL ANGIOGRAM Bilateral 11/08/2013   Procedure: RENAL ANGIOGRAM;  Surgeon: Lorretta Harp, MD;  Location: [facility] CATH LAB;  Service: Cardiovascular;  Laterality: Bilateral;  . RENAL ARTERY STENT Right 11/08/2013   Archie Endo 11/08/2013  . SKIN CANCER EXCISION    . TONSILLECTOMY AND ADENOIDECTOMY  ?1946  . VAGINAL HYSTERECTOMY  1970  . VIDEO ASSISTED THORACOSCOPY (VATS)/WEDGE RESECTION Left 12/15/2013   Procedure: VIDEO ASSISTED THORACOSCOPY (VATS)/WEDGE RESECTION;  Surgeon: Grace Isaac, MD;  Location: [place];   Service: Thoracic;  Laterality: Left;  Marland Kitchen VIDEO BRONCHOSCOPY N/A 12/15/2013   Procedure: VIDEO BRONCHOSCOPY;  Surgeon: Grace Isaac, MD;  Location: [facility] OR;  Service: Thoracic;  Laterality: N/A;     Current Outpatient Medications  Medication Sig Dispense Refill  . acetaminophen (TYLENOL) 500 MG tablet Take 500 mg by mouth 3 (three) times daily.    Marland Kitchen albuterol (PROVENTIL HFA;VENTOLIN HFA) 108 (90 BASE) MCG/ACT inhaler Inhale 1-2 puffs into the lungs every 6 (six) hours as needed for wheezing or shortness of breath. 18 g 1  . allopurinol (ZYLOPRIM) 100 MG tablet TAKE 2 TABLETS BY MOUTH IN THE MORNING 180 tablet 0  . Calcium Carb-Cholecalciferol 600-800 MG-UNIT TABS Take 2 tablets by mouth daily.     . cetirizine (ZYRTEC) 10 MG tablet Take 10 mg by mouth at bedtime.     . fluticasone (FLONASE) 50 MCG/ACT nasal spray USE TWO SPRAY(S) IN EACH NOSTRIL ONCE DAILY 16 g 2  . furosemide (LASIX) 40 MG tablet TAKE 1 TABLET (40 MG TOTAL) BY MOUTH 2 (TWO) TIMES DAILY. 180 tablet 1  . glucose blood (ONE TOUCH ULTRA TEST) test strip Dx E09.9. Please use one strip each time sugars are checked. Pt tests 2 times daily. 100 each 6  . hydrALAZINE (APRESOLINE) 50 MG tablet Take 1 tablet (50 mg total) by mouth 2 (two) times daily. 180 tablet 1  . Lancets (ONETOUCH ULTRASOFT) lancets Use 1 per day dx code 249.00 100 each 12  . lansoprazole (PREVACID) 15 MG capsule Take 15 mg by mouth daily at 12 noon.    Marland Kitchen levothyroxine (SYNTHROID, LEVOTHROID) 88 MCG tablet TAKE 1 TABLET BY MOUTH DAILY BEFORE BREAKFAST  90 tablet 0  . meclizine (ANTIVERT) 25 MG tablet Take 1 tablet (25 mg total) by mouth 3 (three) times daily as needed for dizziness. 45 tablet 0  . ONETOUCH DELICA LANCETS 92E MISC Use to check blood sugar once daily as instructed. Dx code: E11.9 100 each 2  . sertraline (ZOLOFT) 50 MG tablet TAKE ONE AND ONE-HALF TABLETS BY MOUTH DAILY  135 tablet 1  . BYSTOLIC 10 MG tablet     . carvedilol (COREG) 6.25 MG tablet  Take 1 tablet (6.25 mg total) 2 (two) times daily by mouth. 180 tablet 3   No current facility-administered medications for this visit.     Allergies:   Benazepril hcl; Loratadine; Allegra [fexofenadine hcl]; Celecoxib; Fexofenadine; Lisinopril; Sulfa antibiotics; and Sulfur    Social History:  The patient  reports that  has never smoked. she has never used smokeless tobacco. She reports that she does not drink alcohol or use drugs.   Family History:  The patient's family history includes Allergies in her brother and sister; Breast cancer in her sister; Diabetes in her brother and father; Emphysema in her father; Heart disease in her father and mother; Stroke in her father; Thyroid disease in her sister.    ROS:  Please see the history of present illness.   Otherwise, review of systems are positive for none.   All other systems are reviewed and negative.    PHYSICAL EXAM: VS:  BP 124/62   Pulse 61   Ht 5\' 4"  (1.626 m)   Wt 233 lb (105.7 kg)   SpO2 95%   BMI 39.99 kg/m  , BMI Body mass index is 39.99 kg/m. GEN: Well nourished, well developed, in no acute distress  HEENT: normal  Neck: no JVD, carotid bruits, or masses Cardiac: RRR; no rubs, or gallops,no edema . It is 1/6 systolic murmur in the aortic area Respiratory:  clear to auscultation bilaterally, normal work of breathing GI: soft, nontender, nondistended, + BS MS: no deformity or atrophy  Skin: warm and dry, no rash Neuro:  Strength and sensation are intact Psych: euthymic mood, full affect   EKG:  EKG is not ordered today.    Recent Labs: 12/25/2016: Pro B Natriuretic peptide (BNP) 224.0 09/19/2017: ALT 8; BUN 55; Creat 1.58; Hemoglobin 10.9; Platelets 342; Potassium 4.4; Sodium 137; TSH 0.74    Lipid Panel    Component Value Date/Time   CHOL 171 09/19/2017 1517   TRIG 207 (H) 09/19/2017 1517   HDL 52 09/19/2017 1517   CHOLHDL 3.3 09/19/2017 1517   VLDL 40.4 (H) 03/26/2017 1133   LDLCALC 134 (H) 09/16/2016  0917   LDLDIRECT 120.0 03/26/2017 1133      Wt Readings from Last 3 Encounters:  09/23/17 230 lb (104.3 kg)  09/23/17 233 lb (105.7 kg)  09/19/17 236 lb 4 oz (107.2 kg)        ASSESSMENT AND PLAN:   1. Status post right renal artery stenting: Duplex  last year showed patent stent with no significant disease.  Repeat study in 1 year.  2. Essential hypertension: Blood pressure is controlled on current medications.  However, Bystolic copay is $34 a month.  I elected to switch her to carvedilol 6.25 mg twice daily.  I asked her to monitor her blood pressure closely.   Disposition:   FU with me in 12 months  Signed,  Kathlyn Sacramento, MD  09/24/2017 7:12 PM    [facility]

## 2017-09-23 NOTE — Patient Instructions (Signed)
Medication Instructions:   STOP BYSTOLIC  START CARVEDILOL 6.25 MG ONE TABLET TWICE DAILY  Testing/Procedures:  Your physician has requested that you have a renal artery duplex. During this test, an ultrasound is used to evaluate blood flow to the kidneys. Allow one hour for this exam. Do not eat after midnight the day before and avoid carbonated beverages. Take your medications as you usually do.SCHEDULE IN ONE YEAR    Follow-Up:  Your physician wants you to follow-up in: [place] will receive a reminder letter in the mail two months in advance. If you don't receive a letter, please call our office to schedule the follow-up appointment.   If you need a refill on your cardiac medications before your next appointment, please call your pharmacy.

## 2017-09-24 ENCOUNTER — Encounter: Payer: Self-pay | Admitting: Family Medicine

## 2017-09-24 NOTE — Progress Notes (Addendum)
PCP and consultation requested by: [name], MD  Subjective:   HPI: Patient is a 76 y.o. female here for bilateral knee pain.  3/8: Patient reports she's had off and on pain in left knee and left hip that worsened after coming off her prednisone (for IgA nephropathy). Was on this for two years and just off past two months. Pain in left knee is mainly posterior, 0/10 at rest, up to 10/10 and sharp at times. Worse with ambulation. Has a history of a bakers cyst here. Hip pain is lateral, worse and sharp when lying down on this side. Pain here 0/10 at rest, 7/10 at worst. No skin changes, fever, other complaints.  4/7: Patient reports she feels much better. No pain with walking in knee or hip. Pain level 0/10. Some soreness only to touch in these areas and if lying on the left side. No numbness, skin changes.  6/28: Patient reports both of her knees are hurting now. Pain level is 0/10 at rest, up to 8/10 and very stiff, achy with walking. Using a cane. Most pain anterior knees - radiates some posteriorly. She has pain in ankles as well anteriorly. No numbness, skin changes.  8/23: Patient reports she is doing well today. Had a bad day a couple PT sessions ago. Done about 10 visits so far with PT and made good progress. Doing water aerobics. Using a cane. Not taking any medications - concerned about her kidneys being affected by any medication - has not discussed with nephrologist yet. Pain level 0/10 today. Gets soreness of knees primarily with ambulation. No skin changes, numbness.  12/19: Patient reports her right knee has started bothering her - giving out, pain is 2/10 at rest, dull and anterior. Interested in cortisone shot for knee and getting approval for viscosupplementation. Pain on both hips when lying on sides though left side is worse. No radiation of pain. Not doing home exercises as much as she should. Not taking anything for pain. No skin  changes, numbness.  11/15/16: Patient returns to start viscosupplementation in both knees. Pain currently 0/10. No skin changes.  11/22/16: Patient returns for supartz. She's doing better following injections. No skin changes, other complaints.  11/29/16: Patient reports she is doing very well. No pain currently. No skin changes, numbness.  10/22: Patient returns with worsening anterior right knee pain over past 2-3 months. No new injury or trauma. Pain is 5/10 and sharp, worse with walking. Worse medially also. No skin changes, numbness.  11/6: Patient returns to start orthovisc series. Pain level 1/10 left, 0/10 on right. No skin changes.  11/13: Patient reports she's doing well. Pain right knee 3/10 today, 0/10 on left. No skin changes.  11/20: Patient reports she's doing great. Pain level 0/10 in both knees following injections. No skin changes, numbness.  Past Medical History:  Diagnosis Date  . Allergy   . Arthritis    "knees, hips, back, hands" (11/08/2013)  . Asthma   . Basal cell carcinoma    "cut them off face & right shoulder" (11/08/2013)  . CAD (coronary artery disease), non obstructive by cardiac cath 12/12/13 01/24/2014  . CKD (chronic kidney disease), stage III ([place])   . Diastolic CHF ([place])   . DJD (degenerative joint disease)   . Gastric ulcer   . GERD (gastroesophageal reflux disease)   . Hiatal hernia   . History of steroid induced diabetes   . Hyperlipidemia    "don't take RX for it" (11/08/2013)  .  Hypertension   . Hypothyroid   . Lung mass    superior segment LLL/notes 11/08/2013  . Lung nodule/ adenoca > LLobectomy 12/15/13  11/08/2013   PET 11/23/2013 1. Dominant sub solid left lower lobe nodule does not demonstrate significantly increased metabolic activity. However, morphologically, adenocarcinoma is still a concern.   - Spirometry 11/24/13 wnl  - 11/24/2013 T surg eval rec> LLower lobectomy 12/15/2013 for adenoca   . Nephropathy   . Osteoporosis    . Pneumonia    "I've had it ?3 times" (11/08/2013)  . Renal artery stenosis (HCC)    with stent placement    Current Outpatient Medications on File Prior to Visit  Medication Sig Dispense Refill  . acetaminophen (TYLENOL) 500 MG tablet Take 500 mg by mouth 3 (three) times daily.    Marland Kitchen albuterol (PROVENTIL HFA;VENTOLIN HFA) 108 (90 BASE) MCG/ACT inhaler Inhale 1-2 puffs into the lungs every 6 (six) hours as needed for wheezing or shortness of breath. 18 g 1  . allopurinol (ZYLOPRIM) 100 MG tablet TAKE 2 TABLETS BY MOUTH IN THE MORNING 180 tablet 0  . BYSTOLIC 10 MG tablet     . Calcium Carb-Cholecalciferol 600-800 MG-UNIT TABS Take 2 tablets by mouth daily.     . carvedilol (COREG) 6.25 MG tablet Take 1 tablet (6.25 mg total) 2 (two) times daily by mouth. 180 tablet 3  . cetirizine (ZYRTEC) 10 MG tablet Take 10 mg by mouth at bedtime.     . fluticasone (FLONASE) 50 MCG/ACT nasal spray USE TWO SPRAY(S) IN EACH NOSTRIL ONCE DAILY 16 g 2  . furosemide (LASIX) 40 MG tablet TAKE 1 TABLET (40 MG TOTAL) BY MOUTH 2 (TWO) TIMES DAILY. 180 tablet 1  . glucose blood (ONE TOUCH ULTRA TEST) test strip Dx E09.9. Please use one strip each time sugars are checked. Pt tests 2 times daily. 100 each 6  . hydrALAZINE (APRESOLINE) 50 MG tablet Take 1 tablet (50 mg total) by mouth 2 (two) times daily. 180 tablet 1  . Lancets (ONETOUCH ULTRASOFT) lancets Use 1 per day dx code 249.00 100 each 12  . lansoprazole (PREVACID) 15 MG capsule Take 15 mg by mouth daily at 12 noon.    Marland Kitchen levothyroxine (SYNTHROID, LEVOTHROID) 88 MCG tablet TAKE 1 TABLET BY MOUTH DAILY BEFORE BREAKFAST  90 tablet 0  . meclizine (ANTIVERT) 25 MG tablet Take 1 tablet (25 mg total) by mouth 3 (three) times daily as needed for dizziness. 45 tablet 0  . ONETOUCH DELICA LANCETS 18H MISC Use to check blood sugar once daily as instructed. Dx code: E11.9 100 each 2  . sertraline (ZOLOFT) 50 MG tablet TAKE ONE AND ONE-HALF TABLETS BY MOUTH DAILY  135  tablet 1   No current facility-administered medications on file prior to visit.     Past Surgical History:  Procedure Laterality Date  . ANTERIOR CERVICAL DECOMP/DISCECTOMY FUSION  2000   C5-C6  . CARDIAC CATHETERIZATION     Hx: of 2/ 2015  . CHOLECYSTECTOMY  1970's  . CORONARY ANGIOGRAM     Hx: of 2/ 2015  . DILATION AND CURETTAGE OF UTERUS  1960's  . KNEE ARTHROSCOPY Bilateral   . LEFT HEART CATHETERIZATION WITH CORONARY ANGIOGRAM N/A 12/13/2013   Procedure: LEFT HEART CATHETERIZATION WITH CORONARY ANGIOGRAM;  Surgeon: Burnell Blanks, MD;  Location: [facility] CATH LAB;  Service: Cardiovascular;  Laterality: N/A;  . LOBECTOMY Left 12/15/2013   Procedure: LOBECTOMY;  Surgeon: Grace Isaac, MD;  Location: [place];  Service: Thoracic;  Laterality: Left;  . PERCUTANEOUS STENT INTERVENTION Right 11/08/2013   Procedure: PERCUTANEOUS STENT INTERVENTION;  Surgeon: Lorretta Harp, MD;  Location: [facility] CATH LAB;  Service: Cardiovascular;  Laterality: Right;  rt renal artery stent  . RENAL ANGIOGRAM Bilateral 11/08/2013   Procedure: RENAL ANGIOGRAM;  Surgeon: Lorretta Harp, MD;  Location: [facility] CATH LAB;  Service: Cardiovascular;  Laterality: Bilateral;  . RENAL ARTERY STENT Right 11/08/2013   Archie Endo 11/08/2013  . SKIN CANCER EXCISION    . TONSILLECTOMY AND ADENOIDECTOMY  ?1946  . VAGINAL HYSTERECTOMY  1970  . VIDEO ASSISTED THORACOSCOPY (VATS)/WEDGE RESECTION Left 12/15/2013   Procedure: VIDEO ASSISTED THORACOSCOPY (VATS)/WEDGE RESECTION;  Surgeon: Grace Isaac, MD;  Location: [place];  Service: Thoracic;  Laterality: Left;  Marland Kitchen VIDEO BRONCHOSCOPY N/A 12/15/2013   Procedure: VIDEO BRONCHOSCOPY;  Surgeon: Grace Isaac, MD;  Location: [facility] OR;  Service: Thoracic;  Laterality: N/A;    Allergies  Allergen Reactions  . Benazepril Hcl Anaphylaxis and Cough  . Loratadine Anaphylaxis    anaphylaxis  . Allegra [Fexofenadine Hcl] Other (See Comments)    Severe back pain  . Celecoxib Other (See  Comments)    REACTION: aching  . Fexofenadine Other (See Comments)    Severe back pain  . Lisinopril     REACTION: cough  . Sulfa Antibiotics Hives  . Sulfur Rash    Social History   Socioeconomic History  . Marital status: Married    Spouse name: Not on file  . Number of children: Not on file  . Years of education: Not on file  . Highest education level: Not on file  Social Needs  . Financial resource strain: Not on file  . Food insecurity - worry: Not on file  . Food insecurity - inability: Not on file  . Transportation needs - medical: Not on file  . Transportation needs - non-medical: Not on file  Occupational History  . Occupation: Retired  Tobacco Use  . Smoking status: Never Smoker  . Smokeless tobacco: Never Used  Substance and Sexual Activity  . Alcohol use: No    Alcohol/week: 0.0 oz  . Drug use: No  . Sexual activity: Yes  Other Topics Concern  . Not on file  Social History Narrative  . Not on file    Family History  Problem Relation Age of Onset  . Breast cancer Sister   . Heart disease Mother   . Emphysema Father        smoked  . Heart disease Father   . Diabetes Father   . Stroke Father   . Thyroid disease Sister   . Allergies Sister   . Allergies Brother   . Diabetes Brother   . Heart failure Neg Hx     BP (!) 129/91   Pulse 65   Ht 5\' 4"  (1.626 m)   Wt 230 lb (104.3 kg)   BMI 39.48 kg/m   Review of Systems: See HPI above.    Objective:  Physical Exam:  Exam not repeated today. Gen: NAD, comfortable in exam room.  Right knee: No gross deformity, ecchymoses, effusion. TTP medial joint line.  No other tenderness. FROM with 5/5 strength. Negative ant/post drawers. Negative valgus/varus testing. Negative lachmanns. Negative mcmurrays, apleys, patellar apprehension. NV intact distally.  Left knee: No gross deformity, ecchymoses, swelling. No TTP. FROM with 5/5 strength. Negative ant/post drawers. Negative valgus/varus  testing. NV intact distally.    Assessment & Plan:  1. Bilateral knee  pain - 2/2 DJD.  No benefit with cortisone injection.  Doing well with orthovisc.  Third injections given today.  F/u in 1 week for fourth and final injections.  After informed written consent timeout was performed, patient was seated on exam table. Right knee was prepped with alcohol swab and utilizing anteromedial approach, patient's right knee was injected intraarticularly with 35mL bupivicaine followed by orthovisc. Patient tolerated the procedure well without immediate complications.  After informed written consent timeout was performed, patient was seated on exam table. Left knee was prepped with alcohol swab and utilizing anterolateral approach, patient's left knee was injected intraarticularly with 24mL bupivicaine followed by orthovisc. Patient tolerated the procedure well without immediate complications.

## 2017-09-24 NOTE — Assessment & Plan Note (Signed)
No benefit with cortisone injection.  Doing well with orthovisc.  Third injections given today.  F/u in 1 week for fourth and final injections.  After informed written consent timeout was performed, patient was seated on exam table. Right knee was prepped with alcohol swab and utilizing anteromedial approach, patient's right knee was injected intraarticularly with 71mL bupivicaine followed by supartz. Patient tolerated the procedure well without immediate complications.  After informed written consent timeout was performed, patient was seated on exam table. Left knee was prepped with alcohol swab and utilizing anterolateral approach, patient's left knee was injected intraarticularly with 23mL bupivicaine followed by supartz. Patient tolerated the procedure well without immediate complications.

## 2017-09-29 ENCOUNTER — Other Ambulatory Visit: Payer: Self-pay | Admitting: Family Medicine

## 2017-09-29 DIAGNOSIS — D225 Melanocytic nevi of trunk: Secondary | ICD-10-CM | POA: Diagnosis not present

## 2017-09-29 DIAGNOSIS — L821 Other seborrheic keratosis: Secondary | ICD-10-CM | POA: Diagnosis not present

## 2017-09-29 DIAGNOSIS — Z86018 Personal history of other benign neoplasm: Secondary | ICD-10-CM | POA: Diagnosis not present

## 2017-09-29 DIAGNOSIS — Z85828 Personal history of other malignant neoplasm of skin: Secondary | ICD-10-CM | POA: Diagnosis not present

## 2017-09-29 DIAGNOSIS — L905 Scar conditions and fibrosis of skin: Secondary | ICD-10-CM | POA: Diagnosis not present

## 2017-09-29 DIAGNOSIS — Z23 Encounter for immunization: Secondary | ICD-10-CM | POA: Diagnosis not present

## 2017-09-29 DIAGNOSIS — L814 Other melanin hyperpigmentation: Secondary | ICD-10-CM | POA: Diagnosis not present

## 2017-09-29 DIAGNOSIS — D1801 Hemangioma of skin and subcutaneous tissue: Secondary | ICD-10-CM | POA: Diagnosis not present

## 2017-09-30 ENCOUNTER — Ambulatory Visit (INDEPENDENT_AMBULATORY_CARE_PROVIDER_SITE_OTHER): Payer: Medicare Other | Admitting: Family Medicine

## 2017-09-30 ENCOUNTER — Encounter: Payer: Self-pay | Admitting: Family Medicine

## 2017-09-30 DIAGNOSIS — M170 Bilateral primary osteoarthritis of knee: Secondary | ICD-10-CM

## 2017-09-30 NOTE — Assessment & Plan Note (Signed)
No benefit with cortisone.  Fourth and final orthovisc injections given today.  F/u prn.   After informed written consent timeout was performed, patient was seated on exam table. Right knee was prepped with alcohol swab and utilizing anteromedial approach, patient's right knee was injected intraarticularly with 78mL bupivicaine followed by orthovisc. Patient tolerated the procedure well without immediate complications.  After informed written consent timeout was performed, patient was seated on exam table. Left knee was prepped with alcohol swab and utilizing anterolateral approach, patient's left knee was injected intraarticularly with 57mL bupivicaine followed by orthovisc. Patient tolerated the procedure well without immediate complications.

## 2017-09-30 NOTE — Progress Notes (Signed)
PCP and consultation requested by: [name], MD  Subjective:   HPI: Patient is a 76 y.o. female here for bilateral knee pain.  3/8: Patient reports she's had off and on pain in left knee and left hip that worsened after coming off her prednisone (for IgA nephropathy). Was on this for two years and just off past two months. Pain in left knee is mainly posterior, 0/10 at rest, up to 10/10 and sharp at times. Worse with ambulation. Has a history of a bakers cyst here. Hip pain is lateral, worse and sharp when lying down on this side. Pain here 0/10 at rest, 7/10 at worst. No skin changes, fever, other complaints.  4/7: Patient reports she feels much better. No pain with walking in knee or hip. Pain level 0/10. Some soreness only to touch in these areas and if lying on the left side. No numbness, skin changes.  6/28: Patient reports both of her knees are hurting now. Pain level is 0/10 at rest, up to 8/10 and very stiff, achy with walking. Using a cane. Most pain anterior knees - radiates some posteriorly. She has pain in ankles as well anteriorly. No numbness, skin changes.  8/23: Patient reports she is doing well today. Had a bad day a couple PT sessions ago. Done about 10 visits so far with PT and made good progress. Doing water aerobics. Using a cane. Not taking any medications - concerned about her kidneys being affected by any medication - has not discussed with nephrologist yet. Pain level 0/10 today. Gets soreness of knees primarily with ambulation. No skin changes, numbness.  12/19: Patient reports her right knee has started bothering her - giving out, pain is 2/10 at rest, dull and anterior. Interested in cortisone shot for knee and getting approval for viscosupplementation. Pain on both hips when lying on sides though left side is worse. No radiation of pain. Not doing home exercises as much as she should. Not taking anything for pain. No skin  changes, numbness.  11/15/16: Patient returns to start viscosupplementation in both knees. Pain currently 0/10. No skin changes.  11/22/16: Patient returns for supartz. She's doing better following injections. No skin changes, other complaints.  11/29/16: Patient reports she is doing very well. No pain currently. No skin changes, numbness.  10/22: Patient returns with worsening anterior right knee pain over past 2-3 months. No new injury or trauma. Pain is 5/10 and sharp, worse with walking. Worse medially also. No skin changes, numbness.  11/6: Patient returns to start orthovisc series. Pain level 1/10 left, 0/10 on right. No skin changes.  11/13: Patient reports she's doing well. Pain right knee 3/10 today, 0/10 on left. No skin changes.  11/20: Patient reports she's doing great. Pain level 0/10 in both knees following injections. No skin changes, numbness.  11/27: Patient is doing well. Did full exercise program in water aerobics for first time in a while and noted right knee had more discomfort, felt like it was going to give out but she didn't fall. Pain level 0/10 on left, 3/10 on right. No skin changes, numbness.  Past Medical History:  Diagnosis Date  . Allergy   . Arthritis    "knees, hips, back, hands" (11/08/2013)  . Asthma   . Basal cell carcinoma    "cut them off face & right shoulder" (11/08/2013)  . CAD (coronary artery disease), non obstructive by cardiac cath 12/12/13 01/24/2014  . CKD (chronic kidney disease), stage III ([place])   .  Diastolic CHF ([place])   . DJD (degenerative joint disease)   . Gastric ulcer   . GERD (gastroesophageal reflux disease)   . Hiatal hernia   . History of steroid induced diabetes   . Hyperlipidemia    "don't take RX for it" (11/08/2013)  . Hypertension   . Hypothyroid   . Lung mass    superior segment LLL/notes 11/08/2013  . Lung nodule/ adenoca > LLobectomy 12/15/13  11/08/2013   PET 11/23/2013 1. Dominant sub solid left  lower lobe nodule does not demonstrate significantly increased metabolic activity. However, morphologically, adenocarcinoma is still a concern.   - Spirometry 11/24/13 wnl  - 11/24/2013 T surg eval rec> LLower lobectomy 12/15/2013 for adenoca   . Nephropathy   . Osteoporosis   . Pneumonia    "I've had it ?3 times" (11/08/2013)  . Renal artery stenosis (HCC)    with stent placement    Current Outpatient Medications on File Prior to Visit  Medication Sig Dispense Refill  . acetaminophen (TYLENOL) 500 MG tablet Take 500 mg by mouth 3 (three) times daily.    Marland Kitchen albuterol (PROVENTIL HFA;VENTOLIN HFA) 108 (90 BASE) MCG/ACT inhaler Inhale 1-2 puffs into the lungs every 6 (six) hours as needed for wheezing or shortness of breath. 18 g 1  . allopurinol (ZYLOPRIM) 100 MG tablet TAKE TWO TABLETS BY MOUTH IN THE MORNING  180 tablet 0  . BYSTOLIC 10 MG tablet     . Calcium Carb-Cholecalciferol 600-800 MG-UNIT TABS Take 2 tablets by mouth daily.     . carvedilol (COREG) 6.25 MG tablet Take 1 tablet (6.25 mg total) 2 (two) times daily by mouth. 180 tablet 3  . cetirizine (ZYRTEC) 10 MG tablet Take 10 mg by mouth at bedtime.     . fluticasone (FLONASE) 50 MCG/ACT nasal spray USE TWO SPRAY(S) IN EACH NOSTRIL ONCE DAILY 16 g 2  . furosemide (LASIX) 40 MG tablet TAKE 1 TABLET (40 MG TOTAL) BY MOUTH 2 (TWO) TIMES DAILY. 180 tablet 1  . glucose blood (ONE TOUCH ULTRA TEST) test strip Dx E09.9. Please use one strip each time sugars are checked. Pt tests 2 times daily. 100 each 6  . hydrALAZINE (APRESOLINE) 50 MG tablet Take 1 tablet (50 mg total) by mouth 2 (two) times daily. 180 tablet 1  . Lancets (ONETOUCH ULTRASOFT) lancets Use 1 per day dx code 249.00 100 each 12  . lansoprazole (PREVACID) 15 MG capsule Take 15 mg by mouth daily at 12 noon.    Marland Kitchen levothyroxine (SYNTHROID, LEVOTHROID) 88 MCG tablet TAKE ONE TABLET BY MOUTH ONE TIME DAILY BEFORE BREAKFAST 90 tablet 0  . meclizine (ANTIVERT) 25 MG tablet Take 1 tablet  (25 mg total) by mouth 3 (three) times daily as needed for dizziness. 45 tablet 0  . ONETOUCH DELICA LANCETS 61Y MISC Use to check blood sugar once daily as instructed. Dx code: E11.9 100 each 2  . sertraline (ZOLOFT) 50 MG tablet TAKE ONE AND ONE-HALF TABLETS BY MOUTH DAILY  135 tablet 1   No current facility-administered medications on file prior to visit.     Past Surgical History:  Procedure Laterality Date  . ANTERIOR CERVICAL DECOMP/DISCECTOMY FUSION  2000   C5-C6  . CARDIAC CATHETERIZATION     Hx: of 2/ 2015  . CHOLECYSTECTOMY  1970's  . CORONARY ANGIOGRAM     Hx: of 2/ 2015  . DILATION AND CURETTAGE OF UTERUS  1960's  . KNEE ARTHROSCOPY Bilateral   . LEFT HEART  CATHETERIZATION WITH CORONARY ANGIOGRAM N/A 12/13/2013   Procedure: LEFT HEART CATHETERIZATION WITH CORONARY ANGIOGRAM;  Surgeon: Burnell Blanks, MD;  Location: [facility] CATH LAB;  Service: Cardiovascular;  Laterality: N/A;  . LOBECTOMY Left 12/15/2013   Procedure: LOBECTOMY;  Surgeon: Grace Isaac, MD;  Location: [place];  Service: Thoracic;  Laterality: Left;  . PERCUTANEOUS STENT INTERVENTION Right 11/08/2013   Procedure: PERCUTANEOUS STENT INTERVENTION;  Surgeon: Lorretta Harp, MD;  Location: [facility] CATH LAB;  Service: Cardiovascular;  Laterality: Right;  rt renal artery stent  . RENAL ANGIOGRAM Bilateral 11/08/2013   Procedure: RENAL ANGIOGRAM;  Surgeon: Lorretta Harp, MD;  Location: [facility] CATH LAB;  Service: Cardiovascular;  Laterality: Bilateral;  . RENAL ARTERY STENT Right 11/08/2013   Archie Endo 11/08/2013  . SKIN CANCER EXCISION    . TONSILLECTOMY AND ADENOIDECTOMY  ?1946  . VAGINAL HYSTERECTOMY  1970  . VIDEO ASSISTED THORACOSCOPY (VATS)/WEDGE RESECTION Left 12/15/2013   Procedure: VIDEO ASSISTED THORACOSCOPY (VATS)/WEDGE RESECTION;  Surgeon: Grace Isaac, MD;  Location: [place];  Service: Thoracic;  Laterality: Left;  Marland Kitchen VIDEO BRONCHOSCOPY N/A 12/15/2013   Procedure: VIDEO BRONCHOSCOPY;  Surgeon: Grace Isaac,  MD;  Location: [facility] OR;  Service: Thoracic;  Laterality: N/A;    Allergies  Allergen Reactions  . Benazepril Hcl Anaphylaxis and Cough  . Loratadine Anaphylaxis    anaphylaxis  . Allegra [Fexofenadine Hcl] Other (See Comments)    Severe back pain  . Celecoxib Other (See Comments)    REACTION: aching  . Fexofenadine Other (See Comments)    Severe back pain  . Lisinopril     REACTION: cough  . Sulfa Antibiotics Hives  . Sulfur Rash    Social History   Socioeconomic History  . Marital status: Married    Spouse name: Not on file  . Number of children: Not on file  . Years of education: Not on file  . Highest education level: Not on file  Social Needs  . Financial resource strain: Not on file  . Food insecurity - worry: Not on file  . Food insecurity - inability: Not on file  . Transportation needs - medical: Not on file  . Transportation needs - non-medical: Not on file  Occupational History  . Occupation: Retired  Tobacco Use  . Smoking status: Never Smoker  . Smokeless tobacco: Never Used  Substance and Sexual Activity  . Alcohol use: No    Alcohol/week: 0.0 oz  . Drug use: No  . Sexual activity: Yes  Other Topics Concern  . Not on file  Social History Narrative  . Not on file    Family History  Problem Relation Age of Onset  . Breast cancer Sister   . Heart disease Mother   . Emphysema Father        smoked  . Heart disease Father   . Diabetes Father   . Stroke Father   . Thyroid disease Sister   . Allergies Sister   . Allergies Brother   . Diabetes Brother   . Heart failure Neg Hx     BP 136/78   Pulse 64   Ht 5\' 4"  (1.626 m)   Wt 230 lb (104.3 kg)   BMI 39.48 kg/m   Review of Systems: See HPI above.    Objective:  Physical Exam:  Exam not repeated today. Gen: NAD, comfortable in exam room.  Right knee: No gross deformity, ecchymoses, effusion. TTP medial joint line.  No other tenderness. FROM with 5/5  strength. Negative ant/post  drawers. Negative valgus/varus testing. Negative lachmanns. Negative mcmurrays, apleys, patellar apprehension. NV intact distally.  Left knee: No gross deformity, ecchymoses, swelling. No TTP. FROM with 5/5 strength. Negative ant/post drawers. Negative valgus/varus testing. NV intact distally.    Assessment & Plan:  1. Bilateral knee pain - 2/2 DJD.  No benefit with cortisone.  Fourth and final orthovisc injections given today.  F/u prn.   After informed written consent timeout was performed, patient was seated on exam table. Right knee was prepped with alcohol swab and utilizing anteromedial approach, patient's right knee was injected intraarticularly with 35mL bupivicaine followed by orthovisc. Patient tolerated the procedure well without immediate complications.  After informed written consent timeout was performed, patient was seated on exam table. Left knee was prepped with alcohol swab and utilizing anterolateral approach, patient's left knee was injected intraarticularly with 15mL bupivicaine followed by orthovisc. Patient tolerated the procedure well without immediate complications.

## 2017-10-17 ENCOUNTER — Encounter: Payer: Self-pay | Admitting: Podiatry

## 2017-10-17 ENCOUNTER — Ambulatory Visit (INDEPENDENT_AMBULATORY_CARE_PROVIDER_SITE_OTHER): Payer: Medicare Other

## 2017-10-17 ENCOUNTER — Ambulatory Visit (INDEPENDENT_AMBULATORY_CARE_PROVIDER_SITE_OTHER): Payer: Medicare Other | Admitting: Podiatry

## 2017-10-17 VITALS — BP 136/69 | HR 58 | Resp 18

## 2017-10-17 DIAGNOSIS — M659 Synovitis and tenosynovitis, unspecified: Secondary | ICD-10-CM

## 2017-10-17 DIAGNOSIS — I701 Atherosclerosis of renal artery: Secondary | ICD-10-CM

## 2017-10-17 DIAGNOSIS — M792 Neuralgia and neuritis, unspecified: Secondary | ICD-10-CM | POA: Diagnosis not present

## 2017-10-17 DIAGNOSIS — M779 Enthesopathy, unspecified: Secondary | ICD-10-CM | POA: Diagnosis not present

## 2017-10-17 DIAGNOSIS — M19079 Primary osteoarthritis, unspecified ankle and foot: Secondary | ICD-10-CM

## 2017-10-17 NOTE — Progress Notes (Signed)
Subjective:   Patient ID: Susan Pittman, female   DOB: 76 y.o.   MRN: [id number]   HPI Susan Pittman presents to the office today for evaluation of bilateral foot and ankle pain which is been ongoing for about 2-3 years with the left side worse than the right.  She states that her ankles hurt a lot at nighttime and she also gets burning to the top of her left foot.  She said no recent treatment for this.  She has tried changing shoes she states that it does not make a difference if she wears shoes or not.  She denies any cramping to the legs when she walks.  She gets intermittent swelling.  She has been under the care of Dr. Barbaraann Pittman for any issues which she states are doing better since he stopped the gel injections.  She has no other complaints today.   Review of Systems  All other systems reviewed and are negative.   Past Medical History:  Diagnosis Date  . Allergy   . Arthritis    "knees, hips, back, hands" (11/08/2013)  . Asthma   . Basal cell carcinoma    "cut them off face & right shoulder" (11/08/2013)  . CAD (coronary artery disease), non obstructive by cardiac cath 12/12/13 01/24/2014  . CKD (chronic kidney disease), stage III ([place])   . Diastolic CHF ([place])   . DJD (degenerative joint disease)   . Gastric ulcer   . GERD (gastroesophageal reflux disease)   . Hiatal hernia   . History of steroid induced diabetes   . Hyperlipidemia    "don't take RX for it" (11/08/2013)  . Hypertension   . Hypothyroid   . Lung mass    superior segment LLL/notes 11/08/2013  . Lung nodule/ adenoca > LLobectomy 12/15/13  11/08/2013   PET 11/23/2013 1. Dominant sub solid left lower lobe nodule does not demonstrate significantly increased metabolic activity. However, morphologically, adenocarcinoma is still a concern.   - Spirometry 11/24/13 wnl  - 11/24/2013 T surg eval rec> LLower lobectomy 12/15/2013 for adenoca   . Nephropathy   . Osteoporosis   . Pneumonia    "I've had it ?3 times" (11/08/2013)  . Renal  artery stenosis ([place])    with stent placement    Past Surgical History:  Procedure Laterality Date  . ANTERIOR CERVICAL DECOMP/DISCECTOMY FUSION  2000   C5-C6  . CARDIAC CATHETERIZATION     Hx: of 2/ 2015  . CHOLECYSTECTOMY  1970's  . CORONARY ANGIOGRAM     Hx: of 2/ 2015  . DILATION AND CURETTAGE OF UTERUS  1960's  . KNEE ARTHROSCOPY Bilateral   . LEFT HEART CATHETERIZATION WITH CORONARY ANGIOGRAM N/A 12/13/2013   Procedure: LEFT HEART CATHETERIZATION WITH CORONARY ANGIOGRAM;  Surgeon: Burnell Blanks, MD;  [facility] C[facility] LAB;  Service: Cardiovascular;  Laterality: N/A;  . LOBECTOMY Left 12/15/2013   Procedure: LOBECTOMY;  Surgeon: Grace Isaac, MD;  Location: [facility];  Service: Thoracic;  Laterality: Left;  . PERCUTANEOUS STENT INTERVENTION Right 11/08/2013   Procedure: PERCUTANEOUS STENT INTERVENTION;  Surgeon: Lorretta Harp, MD;  Location: [facility] CATH LAB;  Service: Cardiovascular;  Laterality: Right;  rt renal artery stent  . RENAL ANGIOGRAM Bilateral 11/08/2013   Procedure: RENAL ANGIOGRAM;  Surgeon: Lorretta Harp, MD;  Location[facility] CATH LAB;  Service: Cardiovascular;  Laterality: Bilateral;  . RENAL ARTERY STENT Right 11/08/2013   Archie Endo 11/08/2013  . SKIN CANCER EXCISION    . TONSILLECTOMY AND ADENOIDECTOMY  ?  [place]  . VIDEO ASSISTED THORACOSCOPY (VATS)/WEDGE RESECTION Left 12/15/2013   Procedure: VIDEO ASSISTED THORACOSCOPY (VATS)/WEDGE RESECTION;  Surgeon: Grace Isaac, MD;  Location: [place];  Service: Thoracic;  Laterality: Left;  Marland Kitchen VIDEO BRONCHOSCOPY N/A 12/15/2013   Procedure: VIDEO BRONCHOSCOPY;  Surgeon: Grace Isaac, MD;  Location: [facility] OR;  Service: Thoracic;  Laterality: N/A;     Current Outpatient Medications:  .  acetaminophen (TYLENOL) 500 MG tablet, Take 500 mg by mouth 3 (three) times daily., Disp: , Rfl:  .  albuterol (PROVENTIL HFA;VENTOLIN HFA) 108 (90 BASE) MCG/ACT inhaler, Inhale 1-2 puffs into the lungs every 6  (six) hours as needed for wheezing or shortness of breath., Disp: 18 g, Rfl: 1 .  allopurinol (ZYLOPRIM) 100 MG tablet, TAKE TWO TABLETS BY MOUTH IN THE MORNING , Disp: 180 tablet, Rfl: 0 .  BYSTOLIC 10 MG tablet, , Disp: , Rfl:  .  Calcium Carb-Cholecalciferol 600-800 MG-UNIT TABS, Take 2 tablets by mouth daily. , Disp: , Rfl:  .  carvedilol (COREG) 6.25 MG tablet, Take 1 tablet (6.25 mg total) 2 (two) times daily by mouth., Disp: 180 tablet, Rfl: 3 .  cetirizine (ZYRTEC) 10 MG tablet, Take 10 mg by mouth at bedtime. , Disp: , Rfl:  .  fluticasone (FLONASE) 50 MCG/ACT nasal spray, USE TWO SPRAY(S) IN EACH NOSTRIL ONCE DAILY, Disp: 16 g, Rfl: 2 .  furosemide (LASIX) 40 MG tablet, TAKE 1 TABLET (40 MG TOTAL) BY MOUTH 2 (TWO) TIMES DAILY., Disp: 180 tablet, Rfl: 1 .  glucose blood (ONE TOUCH ULTRA TEST) test strip, Dx E09.9. Please use one strip each time sugars are checked. Pt tests 2 times daily., Disp: 100 each, Rfl: 6 .  hydrALAZINE (APRESOLINE) 50 MG tablet, Take 1 tablet (50 mg total) by mouth 2 (two) times daily., Disp: 180 tablet, Rfl: 1 .  Lancets (ONETOUCH ULTRASOFT) lancets, Use 1 per day dx code 249.00, Disp: 100 each, Rfl: 12 .  lansoprazole (PREVACID) 15 MG capsule, Take 15 mg by mouth daily at 12 noon., Disp: , Rfl:  .  levothyroxine (SYNTHROID, LEVOTHROID) 88 MCG tablet, TAKE ONE TABLET BY MOUTH ONE TIME DAILY BEFORE BREAKFAST, Disp: 90 tablet, Rfl: 0 .  meclizine (ANTIVERT) 25 MG tablet, Take 1 tablet (25 mg total) by mouth 3 (three) times daily as needed for dizziness., Disp: 45 tablet, Rfl: 0 .  ONETOUCH DELICA LANCETS 16X MISC, Use to check blood sugar once daily as instructed. Dx code: E11.9, Disp: 100 each, Rfl: 2 .  sertraline (ZOLOFT) 50 MG tablet, TAKE ONE AND ONE-HALF TABLETS BY MOUTH DAILY , Disp: 135 tablet, Rfl: 1  Allergies  Allergen Reactions  . Benazepril Hcl Anaphylaxis and Cough  . Loratadine Anaphylaxis    anaphylaxis  . Allegra [Fexofenadine Hcl] Other (See  Comments)    Severe back pain  . Celecoxib Other (See Comments)    REACTION: aching  . Fexofenadine Other (See Comments)    Severe back pain  . Lisinopril     REACTION: cough  . Sulfa Antibiotics Hives  . Sulfur Rash    Social History   Socioeconomic History  . Marital status: Married    Spouse name: Not on file  . Number of children: Not on file  . Years of education: Not on file  . Highest education level: Not on file  Social Needs  . Financial resource strain: Not on file  . Food insecurity - worry: Not on file  .  Food insecurity - inability: Not on file  . Transportation needs - medical: Not on file  . Transportation needs - non-medical: Not on file  Occupational History  . Occupation: Retired  Tobacco Use  . Smoking status: Never Smoker  . Smokeless tobacco: Never Used  Substance and Sexual Activity  . Alcohol use: No    Alcohol/week: 0.0 oz  . Drug use: No  . Sexual activity: Yes  Other Topics Concern  . Not on file  Social History Narrative  . Not on file       Objective:  Physical Exam  General: AAO x3, NAD  Dermatological: Skin is warm, dry and supple bilateral. Nails x 10 are well manicured; remaining integument appears unremarkable at this time. There are no open sores, no preulcerative lesions, no rash or signs of infection present.  Vascular: Dorsalis Pedis artery and Posterior Tibial artery pedal pulses are 2/4 bilateral with immedate capillary fill time. Pedal hair growth present. There is no pain with calf compression, swelling, warmth, erythema. Varicose veins are present.   Neruologic: Sensation decreased with Derrel Nip monofilament in the left side as well as decreased vibratory sensation on the left side.  Musculoskeletal: Bony exostosis palpable of the dorsal aspect of the Lisfranc joint.  There is minimal discomfort to the dorsal Lisfranc joint but there is no specific area pinpoint tenderness or pain to vibratory sensation.  There is  no area of tenderness identified to the ankle today on the medial or lateral malleolus or proximal tib-fib.  There is no other areas of pinpoint tenderness.  There is no significant edema to the ankles and there is no erythema or increase in warmth.  Equinus is present.  Ankle, subtalar, midtarsal range of motion intact.   Muscular strength 5/5 in all groups tested bilateral.  Gait: Unassisted, Nonantalgic.       Assessment:   76 year old female with Lisfranc osteoarthritis bilaterally, ankle pain; neuritis     Plan:  -Treatment options discussed including all alternatives, risks, and complications -Etiology of symptoms were discussed -X-rays were obtained and reviewed with the patient.  There is no evidence of acute fracture identified.  There is osteoarthritis present in the Lisfranc joint.  Joint space maintained otherwise.  -I think that her pain is multifactorial.  I do think that part of her symptoms are neurological in origin but also due to the osteoarthritis. -I would like to treat the neuritis symptoms but given her kidneys I do not start oral medications.  We will try neuropathy compound cream that I ordered today through [place] -Also given her foot type I think that trying to insert inside of her shoes to help support her feet almost at the good brace to her feet would be helpful. After discussion she wishes to hold off on this for now. Discussed a change in shoes  Trula Slade DPM

## 2017-10-21 ENCOUNTER — Other Ambulatory Visit (INDEPENDENT_AMBULATORY_CARE_PROVIDER_SITE_OTHER): Payer: Medicare Other

## 2017-10-21 DIAGNOSIS — D582 Other hemoglobinopathies: Secondary | ICD-10-CM | POA: Diagnosis not present

## 2017-10-21 LAB — CBC WITH DIFFERENTIAL/PLATELET
BASOS PCT: 1 % (ref 0.0–3.0)
Basophils Absolute: 0.1 10*3/uL (ref 0.0–0.1)
EOS PCT: 2.3 % (ref 0.0–5.0)
Eosinophils Absolute: 0.2 10*3/uL (ref 0.0–0.7)
HEMATOCRIT: 34.9 % — AB (ref 36.0–46.0)
Hemoglobin: 10.9 g/dL — ABNORMAL LOW (ref 12.0–15.0)
LYMPHS PCT: 17.9 % (ref 12.0–46.0)
Lymphs Abs: 1.4 10*3/uL (ref 0.7–4.0)
MCHC: 31.3 g/dL (ref 30.0–36.0)
MCV: 81.3 fl (ref 78.0–100.0)
MONOS PCT: 6.8 % (ref 3.0–12.0)
Monocytes Absolute: 0.5 10*3/uL (ref 0.1–1.0)
NEUTROS ABS: 5.6 10*3/uL (ref 1.4–7.7)
Neutrophils Relative %: 72 % (ref 43.0–77.0)
PLATELETS: 319 10*3/uL (ref 150.0–400.0)
RBC: 4.29 Mil/uL (ref 3.87–5.11)
RDW: 17.5 % — AB (ref 11.5–15.5)
WBC: 7.7 10*3/uL (ref 4.0–10.5)

## 2017-11-10 ENCOUNTER — Encounter: Payer: Medicare Other | Admitting: Orthotics

## 2017-11-17 DIAGNOSIS — N3946 Mixed incontinence: Secondary | ICD-10-CM | POA: Diagnosis not present

## 2017-11-28 ENCOUNTER — Other Ambulatory Visit: Payer: Self-pay | Admitting: Family Medicine

## 2017-12-08 DIAGNOSIS — R93429 Abnormal radiologic findings on diagnostic imaging of unspecified kidney: Secondary | ICD-10-CM | POA: Diagnosis not present

## 2017-12-08 DIAGNOSIS — N028 Recurrent and persistent hematuria with other morphologic changes: Secondary | ICD-10-CM | POA: Diagnosis not present

## 2017-12-08 DIAGNOSIS — R350 Frequency of micturition: Secondary | ICD-10-CM | POA: Diagnosis not present

## 2017-12-08 DIAGNOSIS — R918 Other nonspecific abnormal finding of lung field: Secondary | ICD-10-CM | POA: Diagnosis not present

## 2017-12-08 DIAGNOSIS — I129 Hypertensive chronic kidney disease with stage 1 through stage 4 chronic kidney disease, or unspecified chronic kidney disease: Secondary | ICD-10-CM | POA: Diagnosis not present

## 2017-12-08 DIAGNOSIS — D649 Anemia, unspecified: Secondary | ICD-10-CM | POA: Diagnosis not present

## 2017-12-08 DIAGNOSIS — N184 Chronic kidney disease, stage 4 (severe): Secondary | ICD-10-CM | POA: Diagnosis not present

## 2017-12-08 DIAGNOSIS — I701 Atherosclerosis of renal artery: Secondary | ICD-10-CM | POA: Diagnosis not present

## 2017-12-08 DIAGNOSIS — Z6841 Body Mass Index (BMI) 40.0 and over, adult: Secondary | ICD-10-CM | POA: Diagnosis not present

## 2017-12-09 ENCOUNTER — Encounter: Payer: Self-pay | Admitting: Family Medicine

## 2017-12-09 ENCOUNTER — Encounter: Payer: Self-pay | Admitting: Cardiovascular Disease

## 2017-12-10 ENCOUNTER — Other Ambulatory Visit: Payer: Self-pay

## 2017-12-10 ENCOUNTER — Encounter: Payer: Self-pay | Admitting: Family Medicine

## 2017-12-10 DIAGNOSIS — I2510 Atherosclerotic heart disease of native coronary artery without angina pectoris: Secondary | ICD-10-CM

## 2017-12-11 MED ORDER — OSELTAMIVIR PHOSPHATE 75 MG PO CAPS: 75.0000 mg | ORAL_CAPSULE | Freq: Every day | ORAL | 0 refills | Status: DC

## 2017-12-17 ENCOUNTER — Other Ambulatory Visit (HOSPITAL_COMMUNITY): Payer: Self-pay | Admitting: *Deleted

## 2017-12-18 ENCOUNTER — Ambulatory Visit (HOSPITAL_COMMUNITY)
Admission: RE | Admit: 2017-12-18 | Discharge: 2017-12-18 | Disposition: A | Discharge status: Home / Self Care | Payer: Medicare Other | Source: Ambulatory Visit | Attending: Nephrology | Admitting: Nephrology

## 2017-12-18 DIAGNOSIS — D631 Anemia in chronic kidney disease: Secondary | ICD-10-CM | POA: Diagnosis not present

## 2017-12-18 MED ORDER — SODIUM CHLORIDE 0.9 % IV SOLN
510.0000 mg | Freq: Once | INTRAVENOUS | Status: AC
  Administered 2017-12-18: 11:00:00 510 mg via INTRAVENOUS
  Filled 2017-12-18: qty 17

## 2017-12-19 ENCOUNTER — Ambulatory Visit (HOSPITAL_COMMUNITY)
Admission: RE | Admit: 2017-12-19 | Discharge: 2017-12-19 | Disposition: A | Discharge status: Home / Self Care | Payer: Medicare Other | Source: Ambulatory Visit | Attending: Cardiology | Admitting: Cardiology

## 2017-12-19 ENCOUNTER — Other Ambulatory Visit: Payer: Self-pay | Admitting: Cardiovascular Disease

## 2017-12-19 DIAGNOSIS — R350 Frequency of micturition: Secondary | ICD-10-CM | POA: Diagnosis not present

## 2017-12-19 DIAGNOSIS — I6523 Occlusion and stenosis of bilateral carotid arteries: Secondary | ICD-10-CM | POA: Insufficient documentation

## 2017-12-19 DIAGNOSIS — R351 Nocturia: Secondary | ICD-10-CM | POA: Diagnosis not present

## 2017-12-19 DIAGNOSIS — R42 Dizziness and giddiness: Secondary | ICD-10-CM

## 2017-12-19 DIAGNOSIS — N3946 Mixed incontinence: Secondary | ICD-10-CM | POA: Diagnosis not present

## 2017-12-25 ENCOUNTER — Telehealth: Payer: Self-pay | Admitting: *Deleted

## 2017-12-25 ENCOUNTER — Other Ambulatory Visit: Payer: Self-pay | Admitting: Family Medicine

## 2017-12-25 NOTE — Telephone Encounter (Signed)
-----   Message from Wellington Hampshire, MD sent at 12/25/2017  2:10 PM EST ----- Inform patient that Doppler showed minimal disease in the carotid arteries.  This is good news overall.

## 2017-12-25 NOTE — Telephone Encounter (Signed)
Call placed to the patient to inform her of carotid results. She stated that for months now she has been having periods of lightheadedness where she feels like she is going to pass out. She stated that this normally happens from 8-10 am. This occurs when she gets up to ambulate and she has to immediatly sit down or she feels like she may fall.   She currently takes carvedilol 6.25 bid, furosemide 40 mg bid and hydralazine only in the evening (it's prescribed bid) She is not currently taking Bystolic. This has been taken off of her medication list.  She has not been checking her blood pressure or heart rate. She has been advised to start checking her blood pressure and heart rate and to keep a log of the readings.

## 2017-12-26 NOTE — Telephone Encounter (Signed)
Left a message to call back.

## 2017-12-26 NOTE — Telephone Encounter (Signed)
I agree that we need BP/HR readings. She should then updates [place].

## 2017-12-26 NOTE — Telephone Encounter (Signed)
Patient called and instructed to check her blood pressure and heart rate over the weekend. She will call back on Monday with these readings.

## 2017-12-30 ENCOUNTER — Telehealth: Payer: Self-pay | Admitting: Cardiovascular Disease

## 2017-12-30 NOTE — Telephone Encounter (Signed)
Follow Up ° °Pt returning call for nurse °

## 2017-12-30 NOTE — Telephone Encounter (Signed)
Left a message to call back.

## 2017-12-30 NOTE — Telephone Encounter (Signed)
Follow up    Patient returning call to nurse. Unable to contact nurse  Please call again

## 2017-12-30 NOTE — Telephone Encounter (Signed)
New  Message    Patient calling to report blood pressures.  Pt c/o BP issue: STAT if pt c/o blurred vision, one-sided weakness or slurred speech  1. What are your last 5 BP readings?  Feb 22- 145/82 HR 74, 117/68 HR 81, 105/61 HR 83, 111/71 HR 80, 107/65 HR 81 Fed 23- 162/75 HR 75, 125/67 HR 75, 133/69 Feb 24- 129/72 HR 68 Feb 25- 163/88 HR 72 Feb 26- 159/85 HR 75  2. Are you having any other symptoms (ex. Dizziness, headache, blurred vision, passed out)? Dizziness at imes  3. What is your BP issue? Calling to report BP

## 2017-12-30 NOTE — Telephone Encounter (Signed)
Returned the call to the patient to clarify at what time of day she had taken her blood pressure.  She stated that she had taken her blood pressure in the mornings before her medications.The readings after the initial daily ones (bolded) were from when she was feeling light headed and checked her blood pressure and heart rates.  Feb 22- 145/82 HR 74, 117/68 HR 81, 105/61 HR 83, 111/71 HR 80, 107/65 HR 81 Feb 23- 162/75 HR 75, 125/67 HR 75, 133/69 Feb 24- 129/72 HR 68 Feb 25- 163/88 HR 72 Feb 26- 159/85 HR 75  The patient did state that she was feeling better now and was not having a problem with the light headedness.

## 2018-01-05 DIAGNOSIS — R350 Frequency of micturition: Secondary | ICD-10-CM | POA: Diagnosis not present

## 2018-01-05 DIAGNOSIS — N3946 Mixed incontinence: Secondary | ICD-10-CM | POA: Diagnosis not present

## 2018-01-05 DIAGNOSIS — R351 Nocturia: Secondary | ICD-10-CM | POA: Diagnosis not present

## 2018-01-26 ENCOUNTER — Telehealth: Payer: Self-pay | Admitting: Internal Medicine

## 2018-01-26 DIAGNOSIS — J4521 Mild intermittent asthma with (acute) exacerbation: Secondary | ICD-10-CM | POA: Diagnosis not present

## 2018-01-26 NOTE — Telephone Encounter (Signed)
Returned call to patient.  HSe was requesting to have lab and MD appointment on same day.  I informed patient that labs would need to be performed same day as CT and then Follow up visit would have to be few days later due to getting results from CT.  She said she would just leave appt as is.

## 2018-01-29 ENCOUNTER — Other Ambulatory Visit: Payer: Self-pay | Admitting: Family Medicine

## 2018-02-27 ENCOUNTER — Other Ambulatory Visit: Payer: Self-pay | Admitting: Cardiovascular Disease

## 2018-02-27 NOTE — Telephone Encounter (Signed)
Please review for refill. Thanks!  

## 2018-03-02 ENCOUNTER — Encounter (HOSPITAL_COMMUNITY): Payer: Self-pay

## 2018-03-02 ENCOUNTER — Ambulatory Visit (HOSPITAL_COMMUNITY)
Admission: RE | Admit: 2018-03-02 | Discharge: 2018-03-02 | Disposition: A | Discharge status: Home / Self Care | Payer: Medicare Other | Source: Ambulatory Visit | Attending: Internal Medicine | Admitting: Internal Medicine

## 2018-03-02 ENCOUNTER — Inpatient Hospital Stay: Payer: Medicare Other | Attending: Internal Medicine

## 2018-03-02 DIAGNOSIS — C3432 Malignant neoplasm of lower lobe, left bronchus or lung: Secondary | ICD-10-CM | POA: Insufficient documentation

## 2018-03-02 DIAGNOSIS — I700 Atherosclerosis of aorta: Secondary | ICD-10-CM | POA: Insufficient documentation

## 2018-03-02 DIAGNOSIS — I289 Disease of pulmonary vessels, unspecified: Secondary | ICD-10-CM | POA: Diagnosis not present

## 2018-03-02 DIAGNOSIS — I2510 Atherosclerotic heart disease of native coronary artery without angina pectoris: Secondary | ICD-10-CM | POA: Insufficient documentation

## 2018-03-02 DIAGNOSIS — I10 Essential (primary) hypertension: Secondary | ICD-10-CM

## 2018-03-02 DIAGNOSIS — C3490 Malignant neoplasm of unspecified part of unspecified bronchus or lung: Secondary | ICD-10-CM | POA: Diagnosis not present

## 2018-03-02 DIAGNOSIS — Z85118 Personal history of other malignant neoplasm of bronchus and lung: Secondary | ICD-10-CM | POA: Diagnosis not present

## 2018-03-02 HISTORY — DX: Malignant neoplasm of unspecified part of unspecified bronchus or lung: C34.90

## 2018-03-02 LAB — CBC WITH DIFFERENTIAL/PLATELET
BASOS ABS: 0.1 10*3/uL (ref 0.0–0.1)
BASOS PCT: 1 %
EOS ABS: 0.1 10*3/uL (ref 0.0–0.5)
EOS PCT: 2 %
HCT: 38.5 % (ref 34.8–46.6)
Hemoglobin: 12.3 g/dL (ref 11.6–15.9)
LYMPHS ABS: 1.3 10*3/uL (ref 0.9–3.3)
LYMPHS PCT: 18 %
MCH: 26.8 pg (ref 25.1–34.0)
MCHC: 31.9 g/dL (ref 31.5–36.0)
MCV: 84.1 fL (ref 79.5–101.0)
MONO ABS: 0.5 10*3/uL (ref 0.1–0.9)
Monocytes Relative: 7 %
NEUTROS PCT: 72 %
Neutro Abs: 5.4 10*3/uL (ref 1.5–6.5)
PLATELETS: 271 10*3/uL (ref 145–400)
RBC: 4.58 MIL/uL (ref 3.70–5.45)
RDW: 19.7 % — AB (ref 11.2–14.5)
WBC: 7.5 10*3/uL (ref 3.9–10.3)

## 2018-03-02 LAB — COMPREHENSIVE METABOLIC PANEL
ALT: 11 U/L (ref 0–55)
AST: 15 U/L (ref 5–34)
Albumin: 3.9 g/dL (ref 3.5–5.0)
Alkaline Phosphatase: 111 U/L (ref 40–150)
Anion gap: 11 (ref 3–11)
BUN: 37 mg/dL — ABNORMAL HIGH (ref 7–26)
CHLORIDE: 104 mmol/L (ref 98–109)
CO2: 28 mmol/L (ref 22–29)
Calcium: 9.9 mg/dL (ref 8.4–10.4)
Creatinine, Ser: 1.56 mg/dL — ABNORMAL HIGH (ref 0.60–1.10)
GFR, EST AFRICAN AMERICAN: 35 mL/min — AB (ref >60)
GFR, EST NON AFRICAN AMERICAN: 30 mL/min — AB (ref >60)
Glucose, Bld: 99 mg/dL (ref 70–140)
POTASSIUM: 4.1 mmol/L (ref 3.5–5.1)
Sodium: 143 mmol/L (ref 136–145)
Total Bilirubin: 0.4 mg/dL (ref 0.2–1.2)
Total Protein: 7.2 g/dL (ref 6.4–8.3)

## 2018-03-04 ENCOUNTER — Inpatient Hospital Stay: Payer: Medicare Other | Attending: Internal Medicine | Admitting: Internal Medicine

## 2018-03-04 ENCOUNTER — Telehealth: Payer: Self-pay | Admitting: Internal Medicine

## 2018-03-04 ENCOUNTER — Encounter: Payer: Self-pay | Admitting: Internal Medicine

## 2018-03-04 DIAGNOSIS — I5032 Chronic diastolic (congestive) heart failure: Secondary | ICD-10-CM | POA: Diagnosis not present

## 2018-03-04 DIAGNOSIS — Z85118 Personal history of other malignant neoplasm of bronchus and lung: Secondary | ICD-10-CM | POA: Diagnosis not present

## 2018-03-04 DIAGNOSIS — I129 Hypertensive chronic kidney disease with stage 1 through stage 4 chronic kidney disease, or unspecified chronic kidney disease: Secondary | ICD-10-CM | POA: Insufficient documentation

## 2018-03-04 DIAGNOSIS — Z85828 Personal history of other malignant neoplasm of skin: Secondary | ICD-10-CM | POA: Diagnosis not present

## 2018-03-04 DIAGNOSIS — N183 Chronic kidney disease, stage 3 (moderate): Secondary | ICD-10-CM

## 2018-03-04 DIAGNOSIS — Z79899 Other long term (current) drug therapy: Secondary | ICD-10-CM | POA: Diagnosis not present

## 2018-03-04 DIAGNOSIS — C3490 Malignant neoplasm of unspecified part of unspecified bronchus or lung: Secondary | ICD-10-CM

## 2018-03-04 DIAGNOSIS — I2510 Atherosclerotic heart disease of native coronary artery without angina pectoris: Secondary | ICD-10-CM | POA: Diagnosis not present

## 2018-03-04 DIAGNOSIS — K219 Gastro-esophageal reflux disease without esophagitis: Secondary | ICD-10-CM | POA: Insufficient documentation

## 2018-03-04 DIAGNOSIS — E785 Hyperlipidemia, unspecified: Secondary | ICD-10-CM | POA: Insufficient documentation

## 2018-03-04 DIAGNOSIS — M810 Age-related osteoporosis without current pathological fracture: Secondary | ICD-10-CM

## 2018-03-04 DIAGNOSIS — J45909 Unspecified asthma, uncomplicated: Secondary | ICD-10-CM | POA: Diagnosis not present

## 2018-03-04 DIAGNOSIS — M1990 Unspecified osteoarthritis, unspecified site: Secondary | ICD-10-CM | POA: Diagnosis not present

## 2018-03-04 DIAGNOSIS — K449 Diaphragmatic hernia without obstruction or gangrene: Secondary | ICD-10-CM

## 2018-03-04 DIAGNOSIS — E039 Hypothyroidism, unspecified: Secondary | ICD-10-CM | POA: Diagnosis not present

## 2018-03-04 DIAGNOSIS — I701 Atherosclerosis of renal artery: Secondary | ICD-10-CM | POA: Insufficient documentation

## 2018-03-04 NOTE — Progress Notes (Signed)
[place] [phone]   [phone]  OFFICE PROGRESS NOTE  [name], MD [address] [place] [address]  DIAGNOSIS: Stage IA (T1a, N0, M0) non-small cell lung cancer, adenocarcinoma diagnosed in January of 2015.  PRIOR THERAPY: Bronchoscopy, left video-assisted thoracoscopy, with wedge resection for biopsy of left lower lobe lesion, completion left lower lobectomy, lymph node dissection under the care of Dr. Servando Snare  CURRENT THERAPY: Observation.  INTERVAL HISTORY: Susan Pittman 77 y.o. female returns to the clinic today for annual follow-up visit accompanied by her husband.  The patient is feeling fine today with no specific complaints.  She was a little bit stressed because she was late for her appointment because of the traffic and her blood pressure is elevated.  She denied having any current chest pain, shortness of breath, cough or hemoptysis.  She denied having any weight loss or night sweats.  She has no nausea, vomiting, diarrhea or constipation.  She had repeat CT scan of the chest performed recently and she is here for evaluation and discussion of her risk her results.   MEDICAL HISTORY: Past Medical History:  Diagnosis Date  . Allergy   . Arthritis    "knees, hips, back, hands" (11/08/2013)  . Asthma   . Basal cell carcinoma    "cut them off face & right shoulder" (11/08/2013)  . CAD (coronary artery disease), non obstructive by cardiac cath 12/12/13 01/24/2014  . CKD (chronic kidney disease), stage III ([place])   . Diastolic CHF ([place])   . DJD (degenerative joint disease)   . Gastric ulcer   . GERD (gastroesophageal reflux disease)   . Hiatal hernia   . History of steroid induced diabetes   . Hyperlipidemia    "don't take RX for it" (11/08/2013)  . Hypertension   . Hypothyroid   . Lung cancer ([place]) dx'd 11/2013  . Lung mass    superior segment LLL/notes 11/08/2013  . Lung nodule/ adenoca > LLobectomy 12/15/13   11/08/2013   PET 11/23/2013 1. Dominant sub solid left lower lobe nodule does not demonstrate significantly increased metabolic activity. However, morphologically, adenocarcinoma is still a concern.   - Spirometry 11/24/13 wnl  - 11/24/2013 T surg eval rec> LLower lobectomy 12/15/2013 for adenoca   . Nephropathy   . Osteoporosis   . Pneumonia    "I've had it ?3 times" (11/08/2013)  . Renal artery stenosis ([facility])    with stent placement    ALLERGIES:  is allergic to benazepril hcl; loratadine; allegra [fexofenadine hcl]; celecoxib; fexofenadine; lisinopril; sulfa antibiotics; and sulfur.  MEDICATIONS:  Current Outpatient Medications  Medication Sig Dispense Refill  . acetaminophen (TYLENOL) 500 MG tablet Take 500 mg by mouth 3 (three) times daily.    Marland Kitchen albuterol (PROVENTIL HFA;VENTOLIN HFA) 108 (90 BASE) MCG/ACT inhaler Inhale 1-2 puffs into the lungs every 6 (six) hours as needed for wheezing or shortness of breath. 18 g 1  . allopurinol (ZYLOPRIM) 100 MG tablet TAKE TWO TABLETS BY MOUTH IN THE MORNING 180 tablet 0  . Calcium Carb-Cholecalciferol 600-800 MG-UNIT TABS Take 2 tablets by mouth daily.     . carvedilol (COREG) 6.25 MG tablet Take 1 tablet (6.25 mg total) 2 (two) times daily by mouth. 180 tablet 3  . cetirizine (ZYRTEC) 10 MG tablet Take 10 mg by mouth at bedtime.     . fluticasone (FLONASE) 50 MCG/ACT nasal spray USE 2 SPRAYS IN EACH NOSTRIL ONCE A  DAY 16 g 1  . furosemide (LASIX) 40 MG tablet TAKE ONE TABLET BY MOUTH TWICE DAILY 180 tablet 0  . glucose blood (ONE TOUCH ULTRA TEST) test strip Dx E09.9. Please use one strip each time sugars are checked. Pt tests 2 times daily. 100 each 6  . hydrALAZINE (APRESOLINE) 50 MG tablet TAKE ONE TABLET BY MOUTH (50MG  TOTAL) BY MOUTH 2 (TWO) TIMES DAILY 180 tablet 0  . Lancets (ONETOUCH ULTRASOFT) lancets Use 1 per day dx code 249.00 100 each 12  . lansoprazole (PREVACID) 15 MG capsule Take 15 mg by mouth daily at 12 noon.    Marland Kitchen levothyroxine  (SYNTHROID, LEVOTHROID) 88 MCG tablet TAKE 1 TABLET BY MOUTH ONCE A DAY BEFORE BREAKFAST 90 tablet 0  . meclizine (ANTIVERT) 25 MG tablet Take 1 tablet (25 mg total) by mouth 3 (three) times daily as needed for dizziness. 45 tablet 0  . NON FORMULARY [facility]  Peripheral Neuropathy Cream- Bupivacaine 1%, Doxepin 3%, Gabapentin 6%, Pentoxifylline 3%, Topiramate 1% Apply 1-2 grams to affected area 3-4 times daily Qty. 120 gm 3 refills    . ONETOUCH DELICA LANCETS 41P MISC Use to check blood sugar once daily as instructed. Dx code: E11.9 100 each 2  . oseltamivir (TAMIFLU) 75 MG capsule Take 1 capsule (75 mg total) by mouth daily. 10 capsule 0  . sertraline (ZOLOFT) 50 MG tablet TAKE ONE AND ONE-HALF TABLETS BY MOUTH DAILY  135 tablet 1   No current facility-administered medications for this visit.     SURGICAL HISTORY:  Past Surgical History:  Procedure Laterality Date  . ANTERIOR CERVICAL DECOMP/DISCECTOMY FUSION  2000   C5-C6  . CARDIAC CATHETERIZATION     Hx: of 2/ 2015  . CHOLECYSTECTOMY  1970's  . CORONARY ANGIOGRAM     Hx: of 2/ 2015  . DILATION AND CURETTAGE OF UTERUS  1960's  . KNEE ARTHROSCOPY Bilateral   . LEFT HEART CATHETERIZATION WITH CORONARY ANGIOGRAM N/A 12/13/2013   Procedure: LEFT HEART CATHETERIZATION WITH CORONARY ANGIOGRAM;  Surgeon: Burnell Blanks, MD;  Location: [facility] CATH LAB;  Service: Cardiovascular;  Laterality: N/A;  . LOBECTOMY Left 12/15/2013   Procedure: LOBECTOMY;  Surgeon: Grace Isaac, MD;  Location: [place];  Service: Thoracic;  Laterality: Left;  . PERCUTANEOUS STENT INTERVENTION Right 11/08/2013   Procedure: PERCUTANEOUS STENT INTERVENTION;  Surgeon: Lorretta Harp, MD;  Location: [facility] CATH LAB;  Service: Cardiovascular;  Laterality: Right;  rt renal artery stent  . RENAL ANGIOGRAM Bilateral 11/08/2013   Procedure: RENAL ANGIOGRAM;  Surgeon: Lorretta Harp, MD;  Location: [facility] CATH LAB;  Service: Cardiovascular;  Laterality: Bilateral;    . RENAL ARTERY STENT Right 11/08/2013   Archie Endo 11/08/2013  . SKIN CANCER EXCISION    . TONSILLECTOMY AND ADENOIDECTOMY  ?1946  . VAGINAL HYSTERECTOMY  1970  . VIDEO ASSISTED THORACOSCOPY (VATS)/WEDGE RESECTION Left 12/15/2013   Procedure: VIDEO ASSISTED THORACOSCOPY (VATS)/WEDGE RESECTION;  Surgeon: Grace Isaac, MD;  Location: [place];  Service: Thoracic;  Laterality: Left;  Marland Kitchen VIDEO BRONCHOSCOPY N/A 12/15/2013   Procedure: VIDEO BRONCHOSCOPY;  Surgeon: Grace Isaac, MD;  Location: [facility] OR;  Service: Thoracic;  Laterality: N/A;    REVIEW OF SYSTEMS:  A comprehensive review of systems was negative except for: Musculoskeletal: positive for arthralgias   PHYSICAL EXAMINATION: General appearance: alert, cooperative and no distress Head: Normocephalic, without obvious abnormality, atraumatic Neck: no adenopathy, no JVD, supple, symmetrical, trachea midline and thyroid not enlarged, symmetric, no tenderness/mass/nodules Lymph  nodes: Cervical, supraclavicular, and axillary nodes normal. Resp: clear to auscultation bilaterally Back: symmetric, no curvature. ROM normal. No CVA tenderness. Cardio: regular rate and rhythm, S1, S2 normal, no murmur, click, rub or gallop GI: soft, non-tender; bowel sounds normal; no masses,  no organomegaly Extremities: extremities normal, atraumatic, no cyanosis or edema  ECOG PERFORMANCE STATUS: 1 - Symptomatic but completely ambulatory  Blood pressure (!) 169/71, pulse 62, temperature 97.7 F (36.5 C), temperature source Oral, resp. rate 17, height 5\' 4"  (1.626 m), weight 239 lb 8 oz (108.6 kg), SpO2 99 %.  LABORATORY DATA: Lab Results  Component Value Date   WBC 7.5 03/02/2018   HGB 12.3 03/02/2018   HCT 38.5 03/02/2018   MCV 84.1 03/02/2018   PLT 271 03/02/2018      Chemistry      Component Value Date/Time   NA 143 03/02/2018 0744   NA 144 03/05/2017 0744   K 4.1 03/02/2018 0744   K 4.7 03/05/2017 0744   CL 104 03/02/2018 0744   CO2 28  03/02/2018 0744   CO2 28 03/05/2017 0744   BUN 37 (H) 03/02/2018 0744   BUN 54.4 (H) 03/05/2017 0744   CREATININE 1.56 (H) 03/02/2018 0744   CREATININE 1.58 (H) 09/19/2017 1517   CREATININE 1.7 (H) 03/05/2017 0744      Component Value Date/Time   CALCIUM 9.9 03/02/2018 0744   CALCIUM 9.9 03/05/2017 0744   ALKPHOS 111 03/02/2018 0744   ALKPHOS 98 03/05/2017 0744   AST 15 03/02/2018 0744   AST 13 03/05/2017 0744   ALT 11 03/02/2018 0744   ALT 10 03/05/2017 0744   BILITOT 0.4 03/02/2018 0744   BILITOT 0.45 03/05/2017 0744       RADIOGRAPHIC STUDIES: Ct Chest Wo Contrast  Result Date: 03/02/2018 CLINICAL DATA:  Left lung cancer. EXAM: CT CHEST WITHOUT CONTRAST TECHNIQUE: Multidetector CT imaging of the chest was performed following the standard protocol without IV contrast. COMPARISON:  03/05/2017. FINDINGS: Cardiovascular: Atherosclerotic calcification of the arterial vasculature, including coronary arteries. Pulmonary arteries and heart are enlarged. No pericardial effusion. Mediastinum/Nodes: Mediastinal lymph nodes are not enlarged by CT size criteria. Hilar regions are difficult to evaluate without IV contrast. No axillary adenopathy. Esophagus is grossly unremarkable. Lungs/Pleura: 4 mm subpleural posterior segment right upper lobe nodule, unchanged. Additional scattered subpleural nodules are stable in size. Faint areas of somewhat amorphous ground-glass nodularity are again seen in the right lung, stable. Left lower lobectomy. No pleural fluid. Nodularity is seen along the anterior wall of the trachea. Upper Abdomen: Visualized portions of the liver, adrenal glands, kidneys, spleen, pancreas, stomach and bowel are grossly unremarkable with exception of a small hiatal hernia. Cholecystectomy. No upper abdominal adenopathy. Musculoskeletal: Degenerative changes in the spine. No worrisome lytic or sclerotic lesions. IMPRESSION: 1. Left lower lobectomy without evidence of recurrent or  metastatic disease. 2. Ground-glass and subpleural nodules in the right lung are unchanged. Additional follow-up in 1 year is recommended. 3. Aortic atherosclerosis (ICD10-170.0). Coronary artery calcification. 4. Enlarged pulmonary arteries, indicative of pulmonary arterial hypertension. 5. Ventral tracheal nodularity can be seen with tracheobronchopathia osteochondroplastica. Electronically Signed   By: Lorin Picket M.D.   On: 03/02/2018 13:44   ASSESSMENT AND PLAN:  This is a very pleasant 77 years old white female with a stage IA non-small cell lung cancer, adenocarcinoma diagnosed in January 2015 status post left lower lobectomy with lymph node dissection. The patient is currently on observation and she is feeling fine.  Repeat CT scan  of the chest showed no concerning findings for disease recurrence or progression.  I discussed the scan results with the patient and her husband and recommended for her to continue on observation with repeat CT scan of the chest in 1 year. She was advised to call immediately if she has any concerning symptoms in the interval. The patient voices understanding of current disease status and treatment options and is in agreement with the current care plan. All questions were answered. The patient knows to call the clinic with any problems, questions or concerns. We can certainly see the patient much sooner if necessary. I spent 10 minutes counseling the patient face to face. The total time spent in the appointment was 15 minutes.  Disclaimer: This note was dictated with voice recognition software. Similar sounding words can inadvertently be transcribed and may not be corrected upon review.

## 2018-03-04 NOTE — Telephone Encounter (Signed)
Scheduled appt per 5/1 los - Gave patient AVS and calender per los. Central radiology to contact patient with ct.

## 2018-03-12 ENCOUNTER — Other Ambulatory Visit: Payer: Self-pay | Admitting: Family Medicine

## 2018-03-31 ENCOUNTER — Other Ambulatory Visit: Payer: Self-pay | Admitting: Family Medicine

## 2018-03-31 NOTE — Progress Notes (Addendum)
Subjective:   Susan Pittman is a 77 y.o. female who presents for Medicare Annual (Subsequent) preventive examination.  Review of Systems:  No ROS.  Medicare Wellness Visit. Additional risk factors are reflected in the social history.  Cardiac Risk Factors include: dyslipidemia;advanced age (>54men, >62 women);hypertension;obesity (BMI >30kg/m2);sedentary lifestyle;family history of premature cardiovascular disease Sleep patterns: sleeps 4-10 hours.  Home Safety/Smoke Alarms: Feels safe in home. Smoke alarms in place.  Living environment; residence and Firearm Safety: Lives with husband in 1 story home, steps at door without rail.  Seat Belt Safety/Bike Helmet: Wears seat belt.   Female:   Pap-N/A       Mammo-03/28/2017, negative.  Will schedule in [place].      Dexa scan-03/28/2017, Osteopenia.       CCS-Cologuard 04/17/2017, positive. GI referral placed last year, requested last colonoscopy report. Pt could not find records.       Objective:     Vitals: BP (!) 160/82 (BP Location: Left Arm, Patient Position: Sitting, Cuff Size: Normal)   Pulse 64   Temp 98.2 F (36.8 C) (Temporal)   Resp 18   Ht 5\' 4"  (1.626 m)   Wt 241 lb 8 oz (109.5 kg)   SpO2 98%   BMI 41.45 kg/m   Body mass index is 41.45 kg/m.  Advanced Directives 04/01/2018 05/30/2017 03/26/2017 03/06/2017 05/13/2016 02/28/2016 08/30/2015  Does Patient Have a Medical Advance Directive? Yes No Yes Yes Yes Yes Yes  Type of Paramedic of [place];Living will - [place];Living will [place];Living will [place];Living will Healthcare Power of [place];Living will  Does patient want to make changes to medical advance directive? - - - - No - Patient declined - -  Copy of [place] in Chart? - - No - copy requested - No - copy requested No - copy requested No - copy requested  Pre-existing out of  facility DNR order (yellow form or pink MOST form) - - - - - - -    Tobacco Social History   Tobacco Use  Smoking Status Never Smoker  Smokeless Tobacco Never Used     Counseling given: Not Answered    Past Medical History:  Diagnosis Date  . Allergy   . Arthritis    "knees, hips, back, hands" (11/08/2013)  . Asthma   . Basal cell carcinoma    "cut them off face & right shoulder" (11/08/2013)  . CAD (coronary artery disease), non obstructive by cardiac cath 12/12/13 01/24/2014  . CKD (chronic kidney disease), stage III ([place])   . Diastolic CHF ([place])   . DJD (degenerative joint disease)   . Gastric ulcer   . GERD (gastroesophageal reflux disease)   . Hiatal hernia   . History of steroid induced diabetes   . Hyperlipidemia    "don't take RX for it" (11/08/2013)  . Hypertension   . Hypothyroid   . Lung cancer ([place]) dx'd 11/2013  . Lung mass    superior segment LLL/notes 11/08/2013  . Lung nodule/ adenoca > LLobectomy 12/15/13  11/08/2013   PET 11/23/2013 1. Dominant sub solid left lower lobe nodule does not demonstrate significantly increased metabolic activity. However, morphologically, adenocarcinoma is still a concern.   - Spirometry 11/24/13 wnl  - 11/24/2013 T surg eval rec> LLower lobectomy 12/15/2013 for adenoca   . Nephropathy   . Osteoporosis   . Pneumonia    "I've had it ?3  times" (11/08/2013)  . Renal artery stenosis ([place])    with stent placement   Past Surgical History:  Procedure Laterality Date  . ANTERIOR CERVICAL DECOMP/DISCECTOMY FUSION  2000   C5-C6  . CARDIAC CATHETERIZATION     Hx: of 2/ 2015  . CHOLECYSTECTOMY  1970's  . CORONARY ANGIOGRAM     Hx: of 2/ 2015  . DILATION AND CURETTAGE OF UTERUS  1960's  . KNEE ARTHROSCOPY Bilateral   . LEFT HEART CATHETERIZATION WITH CORONARY ANGIOGRAM N/A 12/13/2013   Procedure: LEFT HEART CATHETERIZATION WITH CORONARY ANGIOGRAM;  Surgeon: Burnell Blanks, MD;  Location: [facility] CATH LAB;  Service: Cardiovascular;  Laterality:  N/A;  . LOBECTOMY Left 12/15/2013   Procedure: LOBECTOMY;  Surgeon: Grace Isaac, MD;  Location: [place];  Service: Thoracic;  Laterality: Left;  . PERCUTANEOUS STENT INTERVENTION Right 11/08/2013   Procedure: PERCUTANEOUS STENT INTERVENTION;  Surgeon: Lorretta Harp, MD;  Location: [facility] CATH LAB;  Service: Cardiovascular;  Laterality: Right;  rt renal artery stent  . RENAL ANGIOGRAM Bilateral 11/08/2013   Procedure: RENAL ANGIOGRAM;  Surgeon: Lorretta Harp, MD;  Location: [facility] CATH LAB;  Service: Cardiovascular;  Laterality: Bilateral;  . RENAL ARTERY STENT Right 11/08/2013   Archie Endo 11/08/2013  . SKIN CANCER EXCISION    . TONSILLECTOMY AND ADENOIDECTOMY  ?1946  . VAGINAL HYSTERECTOMY  1970  . VIDEO ASSISTED THORACOSCOPY (VATS)/WEDGE RESECTION Left 12/15/2013   Procedure: VIDEO ASSISTED THORACOSCOPY (VATS)/WEDGE RESECTION;  Surgeon: Grace Isaac, MD;  Location: [place];  Service: Thoracic;  Laterality: Left;  Marland Kitchen VIDEO BRONCHOSCOPY N/A 12/15/2013   Procedure: VIDEO BRONCHOSCOPY;  Surgeon: Grace Isaac, MD;  Location: [facility] OR;  Service: Thoracic;  Laterality: N/A;   Family History  Problem Relation Age of Onset  . Breast cancer Sister        diagnosed again-mastectomy recently  . Allergies Sister   . Heart disease Mother   . Emphysema Father        smoked  . Heart disease Father   . Diabetes Father   . Stroke Father   . Cancer Brother        Prostate  . Allergies Brother   . Diabetes Brother   . Thyroid disease Sister   . Allergies Sister   . Atrial fibrillation Son   . Stroke Son   . Heart failure Neg Hx    Social History   Socioeconomic History  . Marital status: Married    Spouse name: Not on file  . Number of children: Not on file  . Years of education: Not on file  . Highest education level: Not on file  Occupational History  . Occupation: Retired  Scientific laboratory technician  . Financial resource strain: Not on file  . Food insecurity:    Worry: Not on file    Inability: Not on  file  . Transportation needs:    Medical: Not on file    Non-medical: Not on file  Tobacco Use  . Smoking status: Never Smoker  . Smokeless tobacco: Never Used  Substance and Sexual Activity  . Alcohol use: No    Alcohol/week: 0.0 oz  . Drug use: No  . Sexual activity: Yes  Lifestyle  . Physical activity:    Days per week: Not on file    Minutes per session: Not on file  . Stress: Not on file  Relationships  . Social connections:    Talks on phone: Not on file    Gets together:  Not on file    Attends religious service: Not on file    Active member of club or organization: Not on file    Attends meetings of clubs or organizations: Not on file    Relationship status: Not on file  Other Topics Concern  . Not on file  Social History Narrative  . Not on file    Outpatient Encounter Medications as of 04/01/2018  Medication Sig  . acetaminophen (TYLENOL) 500 MG tablet Take 500 mg by mouth 3 (three) times daily.  Marland Kitchen albuterol (PROVENTIL HFA;VENTOLIN HFA) 108 (90 BASE) MCG/ACT inhaler Inhale 1-2 puffs into the lungs every 6 (six) hours as needed for wheezing or shortness of breath.  . allopurinol (ZYLOPRIM) 100 MG tablet TAKE 2 TABLETS BY MOUTH DAILY IN THE MORNING  . Calcium Carb-Cholecalciferol 600-800 MG-UNIT TABS Take 2 tablets by mouth daily.   . carvedilol (COREG) 6.25 MG tablet Take 1 tablet (6.25 mg total) 2 (two) times daily by mouth.  . cetirizine (ZYRTEC) 10 MG tablet Take 10 mg by mouth at bedtime.   . fluticasone (FLONASE) 50 MCG/ACT nasal spray USE 2 SPRAYS IN EACH NOSTRIL ONCE A DAY  . furosemide (LASIX) 40 MG tablet TAKE ONE TABLET BY MOUTH TWICE DAILY  . hydrALAZINE (APRESOLINE) 50 MG tablet TAKE ONE TABLET BY MOUTH (50MG  TOTAL) BY MOUTH 2 (TWO) TIMES DAILY  . lansoprazole (PREVACID) 15 MG capsule Take 15 mg by mouth daily at 12 noon.  Marland Kitchen levothyroxine (SYNTHROID, LEVOTHROID) 88 MCG tablet TAKE 1 TABLET BY MOUTH ONCE DAILY BEFORE BREAKFAST  . meclizine (ANTIVERT) 25  MG tablet Take 1 tablet (25 mg total) by mouth 3 (three) times daily as needed for dizziness.  . sertraline (ZOLOFT) 50 MG tablet TAKE 1&1/2 TABLETS BY MOUTH DAILY  . glucose blood (ONE TOUCH ULTRA TEST) test strip Dx E09.9. Please use one strip each time sugars are checked. Pt tests 2 times daily. (Patient not taking: Reported on 04/01/2018)  . Lancets (ONETOUCH ULTRASOFT) lancets Use 1 per day dx code 249.00 (Patient not taking: Reported on 04/01/2018)  . NON FORMULARY [facility]  Peripheral Neuropathy Cream- Bupivacaine 1%, Doxepin 3%, Gabapentin 6%, Pentoxifylline 3%, Topiramate 1% Apply 1-2 grams to affected area 3-4 times daily Qty. 120 gm 3 refills  . ONETOUCH DELICA LANCETS 29J MISC Use to check blood sugar once daily as instructed. Dx code: E11.9 (Patient not taking: Reported on 04/01/2018)  . Zoster Vaccine Adjuvanted [facility]) injection Inject 0.5 mLs into the muscle once for 1 dose.  . [DISCONTINUED] allopurinol (ZYLOPRIM) 100 MG tablet TAKE TWO TABLETS BY MOUTH IN THE MORNING  . [DISCONTINUED] levothyroxine (SYNTHROID, LEVOTHROID) 88 MCG tablet TAKE 1 TABLET BY MOUTH ONCE A DAY BEFORE BREAKFAST  . [DISCONTINUED] oseltamivir (TAMIFLU) 75 MG capsule Take 1 capsule (75 mg total) by mouth daily.   No facility-administered encounter medications on file as of 04/01/2018.     Activities of Daily Living In your present state of health, do you have any difficulty performing the following activities: 04/01/2018 09/19/2017  Hearing? N Y  Vision? N N  Difficulty concentrating or making decisions? N N  Walking or climbing stairs? Y N  Comment knee pain -  Dressing or bathing? N N  Doing errands, shopping? N N  Preparing Food and eating ? N -  Using the Toilet? N -  In the past six months, have you accidently leaked urine? N -  Do you have problems with loss of bowel control? N -  Managing  your Medications? N -  Managing your Finances? N -  Housekeeping or managing your  Housekeeping? N -  Some recent data might be hidden    Patient Care Team: [name], MD as PCP - General Grace Isaac, MD as Consulting Physician (Cardiothoracic Surgery) Tanda Rockers, MD as Consulting Physician (Pulmonary Disease) Jamal Maes, MD as Consulting Physician (Nephrology) Wellington Hampshire, MD as Consulting Physician (Cardiology) [place], Jennefer Bravo, MD as Referring Physician (Dermatology) Dene Gentry, MD as Consulting Physician (Sports Medicine) Renee Harder (Internal Medicine) Bjorn Loser, MD as Consulting Physician (Urology)    Assessment:   This is a routine wellness examination for Daionna.  Exercise Activities and Dietary recommendations Current Exercise Habits: The patient does not participate in regular exercise at present, Exercise limited by: orthopedic condition(s) Diet (meal preparation, eat out, water intake, caffeinated beverages, dairy products, fruits and vegetables): Drinks water and occasional diet, caffeine free coke.   Breakfast: cereal; toast w/pbj; coffee Lunch: protein, vegetable and fruit Dinner: half sandwich  Goals    . Exercise 150 min/wk Moderate Activity     Increase activity.       Fall Risk Fall Risk  04/01/2018 09/19/2017 07/08/2017 03/26/2017 03/18/2016  Falls in the past year? Yes No Yes No No  Number falls in past yr: 2 or more - 1 - -  Injury with Fall? Yes - Yes - -  Risk Factor Category  High Fall Risk - - - -  Risk for fall due to : Impaired mobility - Impaired balance/gait - -  Risk for fall due to: Comment - - - - -  Follow up Falls prevention discussed - - - -    Depression Screen PHQ 2/9 Scores 04/01/2018 09/19/2017 07/08/2017 03/26/2017  PHQ - 2 Score 0 0 0 0  PHQ- 9 Score 1 0 0 -  Exception Documentation - - - -     Cognitive Function MMSE - Mini Mental State Exam 04/01/2018  Orientation to time 5  Orientation to Place 5  Registration 3  Attention/ Calculation 5  Recall 2    Language- name 2 objects 2  Language- repeat 1  Language- follow 3 step command 3  Language- read & follow direction 1  Write a sentence 1  Copy design 1  Total score 29        Immunization History  Administered Date(s) Administered  . Influenza Split 07/25/2011, 08/20/2013  . Influenza Whole 07/28/2010  . Influenza, High Dose Seasonal PF 08/23/2014, 07/19/2016  . Influenza,inj,Quad PF,6+ Mos 09/07/2015, 07/08/2017  . Pneumococcal Conjugate-13 02/22/2015  . Pneumococcal Polysaccharide-23 03/18/2016  . Td 01/23/2007     Screening Tests Health Maintenance  Topic Date Due  . MAMMOGRAM  07/05/2018 (Originally 03/28/2018)  . TETANUS/TDAP  09/19/2018 (Originally 01/22/2017)  . INFLUENZA VACCINE  06/04/2018  . DEXA SCAN  Completed  . PNA vac Low Risk Adult  Completed       Plan:    Schedule mammogram.   Shingles vaccine at pharmacy.   Continue doing brain stimulating activities (puzzles, reading, adult coloring books, staying active) to keep memory sharp.   . I have personally reviewed and noted the following in the patient's chart:   . Medical and social history . Use of alcohol, tobacco or illicit drugs  . Current medications and supplements . Functional ability and status . Nutritional status . Physical activity . Advanced directives . List of other physicians . Hospitalizations, surgeries, and ER visits in previous 12 months .  Vitals . Screenings to include cognitive, depression, and falls . Referrals and appointments  In addition, I have reviewed and discussed with patient certain preventive protocols, quality metrics, and best practice recommendations. A written personalized care plan for preventive services as well as general preventive health recommendations were provided to patient.     Gerilyn Nestle, RN  04/01/2018   Reviewed documentation provided by RN and agree w/ above.  [name], MD

## 2018-04-01 ENCOUNTER — Ambulatory Visit (INDEPENDENT_AMBULATORY_CARE_PROVIDER_SITE_OTHER): Payer: Medicare Other | Admitting: Family Medicine

## 2018-04-01 ENCOUNTER — Ambulatory Visit (INDEPENDENT_AMBULATORY_CARE_PROVIDER_SITE_OTHER): Payer: Medicare Other

## 2018-04-01 ENCOUNTER — Encounter: Payer: Self-pay | Admitting: Family Medicine

## 2018-04-01 ENCOUNTER — Other Ambulatory Visit: Payer: Self-pay

## 2018-04-01 VITALS — BP 160/82 | HR 64 | Temp 98.2°F | Resp 18 | Ht 64.0 in | Wt 241.5 lb

## 2018-04-01 DIAGNOSIS — Z23 Encounter for immunization: Secondary | ICD-10-CM

## 2018-04-01 DIAGNOSIS — N3946 Mixed incontinence: Secondary | ICD-10-CM | POA: Diagnosis not present

## 2018-04-01 DIAGNOSIS — Z0000 Encounter for general adult medical examination without abnormal findings: Secondary | ICD-10-CM | POA: Diagnosis not present

## 2018-04-01 DIAGNOSIS — E039 Hypothyroidism, unspecified: Secondary | ICD-10-CM | POA: Diagnosis not present

## 2018-04-01 DIAGNOSIS — I2510 Atherosclerotic heart disease of native coronary artery without angina pectoris: Secondary | ICD-10-CM | POA: Diagnosis not present

## 2018-04-01 DIAGNOSIS — R350 Frequency of micturition: Secondary | ICD-10-CM | POA: Diagnosis not present

## 2018-04-01 DIAGNOSIS — I10 Essential (primary) hypertension: Secondary | ICD-10-CM

## 2018-04-01 LAB — LIPID PANEL
CHOLESTEROL: 184 mg/dL (ref 0–200)
HDL: 45.8 mg/dL (ref >39.00)
LDL CALC: 104 mg/dL — AB (ref 0–99)
NonHDL: 138.51
Total CHOL/HDL Ratio: 4
Triglycerides: 171 mg/dL — ABNORMAL HIGH (ref 0.0–149.0)
VLDL: 34.2 mg/dL (ref 0.0–40.0)

## 2018-04-01 LAB — TSH: TSH: 1.2 u[IU]/mL (ref 0.35–4.50)

## 2018-04-01 MED ORDER — ZOSTER VAC RECOMB ADJUVANTED 50 MCG/0.5ML IM SUSR: 0.5000 mL | Freq: Once | INTRAMUSCULAR | 1 refills | Status: AC

## 2018-04-01 NOTE — Assessment & Plan Note (Signed)
Deteriorated.  Pt's BP is again elevated today but she shared that she is only taking 1 hydralazine in the evening rather than twice daily as directed.  Restart AM dose and f/u w/ new PCP in [place].  Pt expressed understanding and is in agreement w/ plan.

## 2018-04-01 NOTE — Assessment & Plan Note (Signed)
Ongoing issue for pt.  Stressed need for healthy diet and regular exercise once she is settled in her new house.  Check labs to risk stratify.

## 2018-04-01 NOTE — Progress Notes (Signed)
   Subjective:    Patient ID: Susan Pittman, female    DOB: [birth year], 77 y.o.   MRN: [id number]  HPI Hypothyroid- chronic problem.  On Levothyroxine 32mcg daily.  Denies fatigue, changes to skin/hair/nails.  HTN- chronic problem, on Coreg 6.25mg  BID, Lasix 40mg  BID, Hydralazine 50mg  BID (but pt is only taking evening dose).  BP is elevated today at 160/82.  Pt reports BP was elevated when she saw Dr Inda Merlin and again at Urology.  No CP, SOB above baseline, HAs, visual changes, edema.  Obesity- ongoing issue for pt.  BMI is now 41.5.  No regular exercise since moving to [place].  Not following a particular diet.   Review of Systems For ROS see HPI     Objective:   Physical Exam  Constitutional: She is oriented to person, place, and time. She appears well-developed and well-nourished. No distress.  HENT:  Head: Normocephalic and atraumatic.  Eyes: Pupils are equal, round, and reactive to light. Conjunctivae and EOM are normal.  Neck: Normal range of motion. Neck supple. No thyromegaly present.  Cardiovascular: Normal rate, regular rhythm, normal heart sounds and intact distal pulses.  No murmur heard. Pulmonary/Chest: Effort normal and breath sounds normal. No respiratory distress.  Abdominal: Soft. She exhibits no distension. There is no tenderness.  Musculoskeletal: She exhibits no edema.  Lymphadenopathy:    She has no cervical adenopathy.  Neurological: She is alert and oriented to person, place, and time.  Skin: Skin is warm and dry.  Psychiatric: She has a normal mood and affect. Her behavior is normal.  Vitals reviewed.         Assessment & Plan:

## 2018-04-01 NOTE — Assessment & Plan Note (Signed)
Chronic problem.  Currently asymptomatic.  Check labs.  Adjust meds prn  

## 2018-04-01 NOTE — Patient Instructions (Addendum)
Schedule mammogram.   Shingles vaccine at pharmacy.   Continue doing brain stimulating activities (puzzles, reading, adult coloring books, staying active) to keep memory sharp.   Health Maintenance, Female Adopting a healthy lifestyle and getting preventive care can go a long way to promote health and wellness. Talk with your health care provider about what schedule of regular examinations is right for you. This is a good chance for you to check in with your provider about disease prevention and staying healthy. In between checkups, there are plenty of things you can do on your own. Experts have done a lot of research about which lifestyle changes and preventive measures are most likely to keep you healthy. Ask your health care provider for more information. Weight and diet Eat a healthy diet  Be sure to include plenty of vegetables, fruits, low-fat dairy products, and lean protein.  Do not eat a lot of foods high in solid fats, added sugars, or salt.  Get regular exercise. This is one of the most important things you can do for your health. ? Most adults should exercise for at least 150 minutes each week. The exercise should increase your heart rate and make you sweat (moderate-intensity exercise). ? Most adults should also do strengthening exercises at least twice a week. This is in addition to the moderate-intensity exercise.  Maintain a healthy weight  Body mass index (BMI) is a measurement that can be used to identify possible weight problems. It estimates body fat based on height and weight. Your health care provider can help determine your BMI and help you achieve or maintain a healthy weight.  For females 66 years of age and older: ? A BMI below 18.5 is considered underweight. ? A BMI of 18.5 to 24.9 is normal. ? A BMI of 25 to 29.9 is considered overweight. ? A BMI of 30 and above is considered obese.  Watch levels of cholesterol and blood lipids  You should start having your  blood tested for lipids and cholesterol at 79 years of age, then have this test every 5 years.  You may need to have your cholesterol levels checked more often if: ? Your lipid or cholesterol levels are high. ? You are older than 79 years of age. ? You are at high risk for heart disease.  Cancer screening Lung Cancer  Lung cancer screening is recommended for adults 71-48 years old who are at high risk for lung cancer because of a history of smoking.  A yearly low-dose CT scan of the lungs is recommended for people who: ? Currently smoke. ? Have quit within the past 15 years. ? Have at least a 30-pack-year history of smoking. A pack year is smoking an average of one pack of cigarettes a day for 1 year.  Yearly screening should continue until it has been 15 years since you quit.  Yearly screening should stop if you develop a health problem that would prevent you from having lung cancer treatment.  Breast Cancer  Practice breast self-awareness. This means understanding how your breasts normally appear and feel.  It also means doing regular breast self-exams. Let your health care provider know about any changes, no matter how small.  If you are in your 20s or 30s, you should have a clinical breast exam (CBE) by a health care provider every 1-3 years as part of a regular health exam.  If you are 73 or older, have a CBE every year. Also consider having a breast X-ray (mammogram)  every year.  If you have a family history of breast cancer, talk to your health care provider about genetic screening.  If you are at high risk for breast cancer, talk to your health care provider about having an MRI and a mammogram every year.  Breast cancer gene (BRCA) assessment is recommended for women who have family members with BRCA-related cancers. BRCA-related cancers include: ? Breast. ? Ovarian. ? Tubal. ? Peritoneal cancers.  Results of the assessment will determine the need for genetic  counseling and BRCA1 and BRCA2 testing.  Cervical Cancer Your health care provider may recommend that you be screened regularly for cancer of the pelvic organs (ovaries, uterus, and vagina). This screening involves a pelvic examination, including checking for microscopic changes to the surface of your cervix (Pap test). You may be encouraged to have this screening done every 3 years, beginning at age 55.  For women ages 39-65, health care providers may recommend pelvic exams and Pap testing every 3 years, or they may recommend the Pap and pelvic exam, combined with testing for human papilloma virus (HPV), every 5 years. Some types of HPV increase your risk of cervical cancer. Testing for HPV may also be done on women of any age with unclear Pap test results.  Other health care providers may not recommend any screening for nonpregnant women who are considered low risk for pelvic cancer and who do not have symptoms. Ask your health care provider if a screening pelvic exam is right for you.  If you have had past treatment for cervical cancer or a condition that could lead to cancer, you need Pap tests and screening for cancer for at least 20 years after your treatment. If Pap tests have been discontinued, your risk factors (such as having a new sexual partner) need to be reassessed to determine if screening should resume. Some women have medical problems that increase the chance of getting cervical cancer. In these cases, your health care provider may recommend more frequent screening and Pap tests.  Colorectal Cancer  This type of cancer can be detected and often prevented.  Routine colorectal cancer screening usually begins at 79 years of age and continues through 79 years of age.  Your health care provider may recommend screening at an earlier age if you have risk factors for colon cancer.  Your health care provider may also recommend using home test kits to check for hidden blood in the  stool.  A small camera at the end of a tube can be used to examine your colon directly (sigmoidoscopy or colonoscopy). This is done to check for the earliest forms of colorectal cancer.  Routine screening usually begins at age 1.  Direct examination of the colon should be repeated every 5-10 years through 79 years of age. However, you may need to be screened more often if early forms of precancerous polyps or small growths are found.  Skin Cancer  Check your skin from head to toe regularly.  Tell your health care provider about any new moles or changes in moles, especially if there is a change in a mole's shape or color.  Also tell your health care provider if you have a mole that is larger than the size of a pencil eraser.  Always use sunscreen. Apply sunscreen liberally and repeatedly throughout the day.  Protect yourself by wearing long sleeves, pants, a wide-brimmed hat, and sunglasses whenever you are outside.  Heart disease, diabetes, and high blood pressure  High blood pressure causes  heart disease and increases the risk of stroke. High blood pressure is more likely to develop in: ? People who have blood pressure in the high end of the normal range (130-139/85-89 mm Hg). ? People who are overweight or obese. ? People who are African American.  If you are 5-16 years of age, have your blood pressure checked every 3-5 years. If you are 49 years of age or older, have your blood pressure checked every year. You should have your blood pressure measured twice-once when you are at a hospital or clinic, and once when you are not at a hospital or clinic. Record the average of the two measurements. To check your blood pressure when you are not at a hospital or clinic, you can use: ? An automated blood pressure machine at a pharmacy. ? A home blood pressure monitor.  If you are between 13 years and 48 years old, ask your health care provider if you should take aspirin to prevent  strokes.  Have regular diabetes screenings. This involves taking a blood sample to check your fasting blood sugar level. ? If you are at a normal weight and have a low risk for diabetes, have this test once every three years after 79 years of age. ? If you are overweight and have a high risk for diabetes, consider being tested at a younger age or more often. Preventing infection Hepatitis B  If you have a higher risk for hepatitis B, you should be screened for this virus. You are considered at high risk for hepatitis B if: ? You were born in a country where hepatitis B is common. Ask your health care provider which countries are considered high risk. ? Your parents were born in a high-risk country, and you have not been immunized against hepatitis B (hepatitis B vaccine). ? You have HIV or AIDS. ? You use needles to inject street drugs. ? You live with someone who has hepatitis B. ? You have had sex with someone who has hepatitis B. ? You get hemodialysis treatment. ? You take certain medicines for conditions, including cancer, organ transplantation, and autoimmune conditions.  Hepatitis C  Blood testing is recommended for: ? Everyone born from [birth year] through 1967. ? Anyone with known risk factors for hepatitis C.  Sexually transmitted infections (STIs)  You should be screened for sexually transmitted infections (STIs) including gonorrhea and chlamydia if: ? You are sexually active and are younger than 79 years of age. ? You are older than 79 years of age and your health care provider tells you that you are at risk for this type of infection. ? Your sexual activity has changed since you were last screened and you are at an increased risk for chlamydia or gonorrhea. Ask your health care provider if you are at risk.  If you do not have HIV, but are at risk, it may be recommended that you take a prescription medicine daily to prevent HIV infection. This is called pre-exposure prophylaxis  (PrEP). You are considered at risk if: ? You are sexually active and do not regularly use condoms or know the HIV status of your partner(s). ? You take drugs by injection. ? You are sexually active with a partner who has HIV.  Talk with your health care provider about whether you are at high risk of being infected with HIV. If you choose to begin PrEP, you should first be tested for HIV. You should then be tested every 3 months for as long as  you are taking PrEP. Pregnancy  If you are premenopausal and you may become pregnant, ask your health care provider about preconception counseling.  If you may become pregnant, take 400 to 800 micrograms (mcg) of folic acid every day.  If you want to prevent pregnancy, talk to your health care provider about birth control (contraception). Osteoporosis and menopause  Osteoporosis is a disease in which the bones lose minerals and strength with aging. This can result in serious bone fractures. Your risk for osteoporosis can be identified using a bone density scan.  If you are 26 years of age or older, or if you are at risk for osteoporosis and fractures, ask your health care provider if you should be screened.  Ask your health care provider whether you should take a calcium or vitamin D supplement to lower your risk for osteoporosis.  Menopause may have certain physical symptoms and risks.  Hormone replacement therapy may reduce some of these symptoms and risks. Talk to your health care provider about whether hormone replacement therapy is right for you. Follow these instructions at home:  Schedule regular health, dental, and eye exams.  Stay current with your immunizations.  Do not use any tobacco products including cigarettes, chewing tobacco, or electronic cigarettes.  If you are pregnant, do not drink alcohol.  If you are breastfeeding, limit how much and how often you drink alcohol.  Limit alcohol intake to no more than 1 drink per day for  nonpregnant women. One drink equals 12 ounces of beer, 5 ounces of wine, or 1 ounces of hard liquor.  Do not use street drugs.  Do not share needles.  Ask your health care provider for help if you need support or information about quitting drugs.  Tell your health care provider if you often feel depressed.  Tell your health care provider if you have ever been abused or do not feel safe at home. This information is not intended to replace advice given to you by your health care provider. Make sure you discuss any questions you have with your health care provider. Document Released: 05/06/2011 Document Revised: 03/28/2016 Document Reviewed: 07/25/2015 Elsevier Interactive Patient Education  [employer].

## 2018-04-01 NOTE — Patient Instructions (Signed)
We'll notify you of your lab results and make any changes if needed Try and get back to healthy diet and regular exercise- you can do it! Restart the AM dose of Hydralazine Call with any questions or concerns GOOD LUCK IN [facility]!!!

## 2018-04-02 ENCOUNTER — Encounter: Payer: Self-pay | Admitting: General Practice

## 2018-04-06 DIAGNOSIS — J4521 Mild intermittent asthma with (acute) exacerbation: Secondary | ICD-10-CM | POA: Diagnosis not present

## 2018-04-06 DIAGNOSIS — M170 Bilateral primary osteoarthritis of knee: Secondary | ICD-10-CM | POA: Diagnosis not present

## 2018-04-06 DIAGNOSIS — J0100 Acute maxillary sinusitis, unspecified: Secondary | ICD-10-CM | POA: Diagnosis not present

## 2018-04-06 DIAGNOSIS — E039 Hypothyroidism, unspecified: Secondary | ICD-10-CM | POA: Diagnosis not present

## 2018-04-21 DIAGNOSIS — R300 Dysuria: Secondary | ICD-10-CM | POA: Diagnosis not present

## 2018-04-21 DIAGNOSIS — R350 Frequency of micturition: Secondary | ICD-10-CM | POA: Diagnosis not present

## 2018-04-21 DIAGNOSIS — N390 Urinary tract infection, site not specified: Secondary | ICD-10-CM | POA: Diagnosis not present

## 2018-04-22 DIAGNOSIS — M1711 Unilateral primary osteoarthritis, right knee: Secondary | ICD-10-CM | POA: Diagnosis not present

## 2018-04-22 DIAGNOSIS — M1712 Unilateral primary osteoarthritis, left knee: Secondary | ICD-10-CM | POA: Diagnosis not present

## 2018-04-29 ENCOUNTER — Other Ambulatory Visit: Payer: Self-pay | Admitting: General Practice

## 2018-04-29 MED ORDER — HYDRALAZINE HCL 50 MG PO TABS: ORAL_TABLET | ORAL | 0 refills | Status: AC

## 2018-06-17 DIAGNOSIS — M1711 Unilateral primary osteoarthritis, right knee: Secondary | ICD-10-CM | POA: Diagnosis not present

## 2018-06-18 ENCOUNTER — Other Ambulatory Visit: Payer: Self-pay | Admitting: General Practice

## 2018-06-18 MED ORDER — SERTRALINE HCL 50 MG PO TABS: ORAL_TABLET | ORAL | 0 refills | Status: AC

## 2018-07-01 DIAGNOSIS — N183 Chronic kidney disease, stage 3 (moderate): Secondary | ICD-10-CM | POA: Diagnosis not present

## 2018-07-01 DIAGNOSIS — M545 Low back pain: Secondary | ICD-10-CM | POA: Diagnosis not present

## 2018-07-01 DIAGNOSIS — R300 Dysuria: Secondary | ICD-10-CM | POA: Diagnosis not present

## 2018-07-08 DIAGNOSIS — D649 Anemia, unspecified: Secondary | ICD-10-CM | POA: Diagnosis not present

## 2018-07-08 DIAGNOSIS — Z23 Encounter for immunization: Secondary | ICD-10-CM | POA: Diagnosis not present

## 2018-07-08 DIAGNOSIS — R918 Other nonspecific abnormal finding of lung field: Secondary | ICD-10-CM | POA: Diagnosis not present

## 2018-07-08 DIAGNOSIS — I129 Hypertensive chronic kidney disease with stage 1 through stage 4 chronic kidney disease, or unspecified chronic kidney disease: Secondary | ICD-10-CM | POA: Diagnosis not present

## 2018-07-08 DIAGNOSIS — R93429 Abnormal radiologic findings on diagnostic imaging of unspecified kidney: Secondary | ICD-10-CM | POA: Diagnosis not present

## 2018-07-08 DIAGNOSIS — N028 Recurrent and persistent hematuria with other morphologic changes: Secondary | ICD-10-CM | POA: Diagnosis not present

## 2018-07-08 DIAGNOSIS — I701 Atherosclerosis of renal artery: Secondary | ICD-10-CM | POA: Diagnosis not present

## 2018-07-08 DIAGNOSIS — N184 Chronic kidney disease, stage 4 (severe): Secondary | ICD-10-CM | POA: Diagnosis not present

## 2018-07-08 DIAGNOSIS — Z6841 Body Mass Index (BMI) 40.0 and over, adult: Secondary | ICD-10-CM | POA: Diagnosis not present

## 2018-07-17 ENCOUNTER — Other Ambulatory Visit: Payer: Self-pay | Admitting: Nephrology

## 2018-07-17 DIAGNOSIS — N281 Cyst of kidney, acquired: Secondary | ICD-10-CM

## 2018-07-24 DIAGNOSIS — I5032 Chronic diastolic (congestive) heart failure: Secondary | ICD-10-CM | POA: Diagnosis not present

## 2018-07-24 DIAGNOSIS — Z6841 Body Mass Index (BMI) 40.0 and over, adult: Secondary | ICD-10-CM | POA: Diagnosis not present

## 2018-07-24 DIAGNOSIS — R7302 Impaired glucose tolerance (oral): Secondary | ICD-10-CM | POA: Diagnosis not present

## 2018-07-24 DIAGNOSIS — N183 Chronic kidney disease, stage 3 (moderate): Secondary | ICD-10-CM | POA: Diagnosis not present

## 2018-08-04 DIAGNOSIS — N183 Chronic kidney disease, stage 3 (moderate): Secondary | ICD-10-CM | POA: Diagnosis not present

## 2018-09-12 ENCOUNTER — Other Ambulatory Visit: Payer: Medicare Other

## 2018-09-14 ENCOUNTER — Ambulatory Visit
Admission: RE | Admit: 2018-09-14 | Discharge: 2018-09-14 | Disposition: A | Discharge status: Home / Self Care | Payer: Medicare Other | Source: Ambulatory Visit | Attending: Nephrology | Admitting: Nephrology

## 2018-09-14 ENCOUNTER — Ambulatory Visit (HOSPITAL_COMMUNITY)
Admission: RE | Admit: 2018-09-14 | Discharge: 2018-09-14 | Disposition: A | Discharge status: Home / Self Care | Payer: Medicare Other | Source: Ambulatory Visit | Attending: Cardiology | Admitting: Cardiology

## 2018-09-14 DIAGNOSIS — I701 Atherosclerosis of renal artery: Secondary | ICD-10-CM | POA: Insufficient documentation

## 2018-09-14 DIAGNOSIS — N281 Cyst of kidney, acquired: Secondary | ICD-10-CM | POA: Diagnosis not present

## 2018-09-15 ENCOUNTER — Telehealth: Payer: Self-pay | Admitting: *Deleted

## 2018-09-15 NOTE — Telephone Encounter (Signed)
Call patient for results. There was no answer and no voicemail.

## 2018-09-15 NOTE — Telephone Encounter (Signed)
-----   Message from Wellington Hampshire, MD sent at 09/14/2018  5:11 PM EST ----- Inform patient that duplex showed patent right renal artery stent.

## 2018-09-16 DIAGNOSIS — M1711 Unilateral primary osteoarthritis, right knee: Secondary | ICD-10-CM | POA: Diagnosis not present

## 2018-09-16 NOTE — Telephone Encounter (Signed)
Patient made aware of results and verbalized understanding.  

## 2018-10-23 DIAGNOSIS — Z79899 Other long term (current) drug therapy: Secondary | ICD-10-CM | POA: Diagnosis not present

## 2018-10-23 DIAGNOSIS — Z0000 Encounter for general adult medical examination without abnormal findings: Secondary | ICD-10-CM | POA: Diagnosis not present

## 2018-10-23 DIAGNOSIS — Z780 Asymptomatic menopausal state: Secondary | ICD-10-CM | POA: Diagnosis not present

## 2018-10-23 DIAGNOSIS — J4530 Mild persistent asthma, uncomplicated: Secondary | ICD-10-CM | POA: Diagnosis not present

## 2018-10-23 DIAGNOSIS — Z803 Family history of malignant neoplasm of breast: Secondary | ICD-10-CM | POA: Diagnosis not present

## 2018-10-23 DIAGNOSIS — I10 Essential (primary) hypertension: Secondary | ICD-10-CM | POA: Diagnosis not present

## 2019-02-17 ENCOUNTER — Telehealth: Payer: Self-pay | Admitting: Internal Medicine

## 2019-02-17 NOTE — Telephone Encounter (Signed)
Ok to move lab too.

## 2019-02-17 NOTE — Telephone Encounter (Signed)
Called patient regarding rescheduling an appointment, patient wants lab work same day as providers vis it so the lab appointment has been moved Message to provider.

## 2019-03-05 ENCOUNTER — Other Ambulatory Visit: Payer: Medicare Other

## 2019-03-08 ENCOUNTER — Encounter (HOSPITAL_COMMUNITY): Payer: Self-pay

## 2019-03-08 ENCOUNTER — Ambulatory Visit (HOSPITAL_COMMUNITY)
Admission: RE | Admit: 2019-03-08 | Discharge: 2019-03-08 | Disposition: A | Discharge status: Home / Self Care | Payer: Medicare Other | Source: Ambulatory Visit | Attending: Internal Medicine | Admitting: Internal Medicine

## 2019-03-08 ENCOUNTER — Telehealth: Payer: Self-pay | Admitting: Internal Medicine

## 2019-03-08 ENCOUNTER — Encounter: Payer: Self-pay | Admitting: Internal Medicine

## 2019-03-08 ENCOUNTER — Inpatient Hospital Stay: Payer: Medicare Other | Attending: Internal Medicine

## 2019-03-08 ENCOUNTER — Other Ambulatory Visit: Payer: Self-pay

## 2019-03-08 ENCOUNTER — Ambulatory Visit: Payer: Medicare Other | Admitting: Internal Medicine

## 2019-03-08 ENCOUNTER — Inpatient Hospital Stay (HOSPITAL_BASED_OUTPATIENT_CLINIC_OR_DEPARTMENT_OTHER): Payer: Medicare Other | Admitting: Internal Medicine

## 2019-03-08 VITALS — BP 143/63 | HR 81 | Temp 97.9°F | Resp 20 | Ht 64.0 in | Wt 229.2 lb

## 2019-03-08 DIAGNOSIS — I130 Hypertensive heart and chronic kidney disease with heart failure and stage 1 through stage 4 chronic kidney disease, or unspecified chronic kidney disease: Secondary | ICD-10-CM | POA: Insufficient documentation

## 2019-03-08 DIAGNOSIS — K219 Gastro-esophageal reflux disease without esophagitis: Secondary | ICD-10-CM

## 2019-03-08 DIAGNOSIS — E785 Hyperlipidemia, unspecified: Secondary | ICD-10-CM | POA: Diagnosis not present

## 2019-03-08 DIAGNOSIS — M129 Arthropathy, unspecified: Secondary | ICD-10-CM

## 2019-03-08 DIAGNOSIS — J45909 Unspecified asthma, uncomplicated: Secondary | ICD-10-CM

## 2019-03-08 DIAGNOSIS — Z85828 Personal history of other malignant neoplasm of skin: Secondary | ICD-10-CM | POA: Insufficient documentation

## 2019-03-08 DIAGNOSIS — C3490 Malignant neoplasm of unspecified part of unspecified bronchus or lung: Secondary | ICD-10-CM

## 2019-03-08 DIAGNOSIS — M1990 Unspecified osteoarthritis, unspecified site: Secondary | ICD-10-CM | POA: Diagnosis not present

## 2019-03-08 DIAGNOSIS — Z85118 Personal history of other malignant neoplasm of bronchus and lung: Secondary | ICD-10-CM

## 2019-03-08 DIAGNOSIS — I2510 Atherosclerotic heart disease of native coronary artery without angina pectoris: Secondary | ICD-10-CM

## 2019-03-08 DIAGNOSIS — N183 Chronic kidney disease, stage 3 (moderate): Secondary | ICD-10-CM

## 2019-03-08 DIAGNOSIS — K449 Diaphragmatic hernia without obstruction or gangrene: Secondary | ICD-10-CM | POA: Diagnosis not present

## 2019-03-08 DIAGNOSIS — Z79899 Other long term (current) drug therapy: Secondary | ICD-10-CM | POA: Insufficient documentation

## 2019-03-08 DIAGNOSIS — E039 Hypothyroidism, unspecified: Secondary | ICD-10-CM

## 2019-03-08 DIAGNOSIS — I10 Essential (primary) hypertension: Secondary | ICD-10-CM

## 2019-03-08 DIAGNOSIS — M810 Age-related osteoporosis without current pathological fracture: Secondary | ICD-10-CM | POA: Diagnosis not present

## 2019-03-08 DIAGNOSIS — C3432 Malignant neoplasm of lower lobe, left bronchus or lung: Secondary | ICD-10-CM

## 2019-03-08 DIAGNOSIS — I5032 Chronic diastolic (congestive) heart failure: Secondary | ICD-10-CM | POA: Diagnosis not present

## 2019-03-08 LAB — CBC WITH DIFFERENTIAL (CANCER CENTER ONLY)
Abs Immature Granulocytes: 0.03 10*3/uL (ref 0.00–0.07)
Basophils Absolute: 0.1 10*3/uL (ref 0.0–0.1)
Basophils Relative: 1 %
Eosinophils Absolute: 0.2 10*3/uL (ref 0.0–0.5)
Eosinophils Relative: 2 %
HCT: 37.1 % (ref 36.0–46.0)
Hemoglobin: 11.6 g/dL — ABNORMAL LOW (ref 12.0–15.0)
Immature Granulocytes: 0 %
Lymphocytes Relative: 17 %
Lymphs Abs: 1.7 10*3/uL (ref 0.7–4.0)
MCH: 26.5 pg (ref 26.0–34.0)
MCHC: 31.3 g/dL (ref 30.0–36.0)
MCV: 84.7 fL (ref 80.0–100.0)
Monocytes Absolute: 0.7 10*3/uL (ref 0.1–1.0)
Monocytes Relative: 7 %
Neutro Abs: 7.4 10*3/uL (ref 1.7–7.7)
Neutrophils Relative %: 73 %
Platelet Count: 335 10*3/uL (ref 150–400)
RBC: 4.38 MIL/uL (ref 3.87–5.11)
RDW: 17.1 % — ABNORMAL HIGH (ref 11.5–15.5)
WBC Count: 10 10*3/uL (ref 4.0–10.5)
nRBC: 0 % (ref 0.0–0.2)

## 2019-03-08 LAB — CMP (CANCER CENTER ONLY)
ALT: 9 U/L (ref 0–44)
AST: 16 U/L (ref 15–41)
Albumin: 3.9 g/dL (ref 3.5–5.0)
Alkaline Phosphatase: 111 U/L (ref 38–126)
Anion gap: 11 (ref 5–15)
BUN: 73 mg/dL — ABNORMAL HIGH (ref 8–23)
CO2: 25 mmol/L (ref 22–32)
Calcium: 9.3 mg/dL (ref 8.9–10.3)
Chloride: 103 mmol/L (ref 98–111)
Creatinine: 2.22 mg/dL — ABNORMAL HIGH (ref 0.44–1.00)
GFR, Est AFR Am: 23 mL/min — ABNORMAL LOW (ref >60)
GFR, Estimated: 20 mL/min — ABNORMAL LOW (ref >60)
Glucose, Bld: 134 mg/dL — ABNORMAL HIGH (ref 70–99)
Potassium: 4.5 mmol/L (ref 3.5–5.1)
Sodium: 139 mmol/L (ref 135–145)
Total Bilirubin: 0.3 mg/dL (ref 0.3–1.2)
Total Protein: 7.5 g/dL (ref 6.5–8.1)

## 2019-03-08 NOTE — Telephone Encounter (Signed)
Scheduled appt per 5/4 los - 1 year follow up - sent reminder letter in the mail with appt date and time

## 2019-03-08 NOTE — Progress Notes (Signed)
[place] [phone]   Fax:([phone]  OFFICE PROGRESS NOTE  [name], MD [address] [place] [address]  DIAGNOSIS: Stage IA (T1a, N0, M0) non-small cell lung cancer, adenocarcinoma diagnosed in January of 2015.  PRIOR THERAPY: Bronchoscopy, left video-assisted thoracoscopy, with wedge resection for biopsy of left lower lobe lesion, completion left lower lobectomy, lymph node dissection under the care of Dr. Servando Snare  CURRENT THERAPY: Observation.  INTERVAL HISTORY: Susan Pittman 78 y.o. female returns to the clinic today for follow-up visit.  The patient is feeling fine today with no concerning complaints.  She denied having any chest pain, shortness of breath, cough or hemoptysis.  She denied having any fever or chills.  She has no nausea, vomiting, diarrhea or constipation.  She has repeat CT scan of the chest performed earlier today and she is here for evaluation and discussion of her scan results.  MEDICAL HISTORY: Past Medical History:  Diagnosis Date   Allergy    Arthritis    "knees, hips, back, hands" (11/08/2013)   Asthma    Basal cell carcinoma    "cut them off face & right shoulder" (11/08/2013)   CAD (coronary artery disease), non obstructive by cardiac cath 12/12/13 01/24/2014   CKD (chronic kidney disease), stage III (HCC)    Diastolic CHF (HCC)    DJD (degenerative joint disease)    Gastric ulcer    GERD (gastroesophageal reflux disease)    Hiatal hernia    History of steroid induced diabetes    Hyperlipidemia    "don't take RX for it" (11/08/2013)   Hypertension    Hypothyroid    Lung cancer ([place]) dx'd 11/2013   Lung mass    superior segment LLL/notes 11/08/2013   Lung nodule/ adenoca > LLobectomy 12/15/13  11/08/2013   PET 11/23/2013 1. Dominant sub solid left lower lobe nodule does not demonstrate significantly increased metabolic activity. However, morphologically, adenocarcinoma is still a  concern.   - Spirometry 11/24/13 wnl  - 11/24/2013 T surg eval rec> LLower lobectomy 12/15/2013 for adenoca    Nephropathy    Osteoporosis    Pneumonia    "I've had it ?3 times" (11/08/2013)   Renal artery stenosis ([place])    with stent placement    ALLERGIES:  is allergic to benazepril hcl; loratadine; allegra [fexofenadine hcl]; celecoxib; fexofenadine; lisinopril; sulfa antibiotics; and sulfur.  MEDICATIONS:  Current Outpatient Medications  Medication Sig Dispense Refill   acetaminophen (TYLENOL) 500 MG tablet Take 500 mg by mouth 3 (three) times daily.     albuterol (PROVENTIL HFA;VENTOLIN HFA) 108 (90 BASE) MCG/ACT inhaler Inhale 1-2 puffs into the lungs every 6 (six) hours as needed for wheezing or shortness of breath. 18 g 1   allopurinol (ZYLOPRIM) 100 MG tablet TAKE 2 TABLETS BY MOUTH DAILY IN THE MORNING 60 tablet 3   Calcium Carb-Cholecalciferol 600-800 MG-UNIT TABS Take 2 tablets by mouth daily.      carvedilol (COREG) 6.25 MG tablet Take 1 tablet (6.25 mg total) 2 (two) times daily by mouth. 180 tablet 3   cetirizine (ZYRTEC) 10 MG tablet Take 10 mg by mouth at bedtime.      fluticasone (FLONASE) 50 MCG/ACT nasal spray USE 2 SPRAYS IN EACH NOSTRIL ONCE A DAY 16 g 1   furosemide (LASIX) 40 MG tablet TAKE ONE TABLET BY MOUTH TWICE DAILY 180 tablet 0   glucose blood (ONE TOUCH ULTRA TEST) test strip Dx  E09.9. Please use one strip each time sugars are checked. Pt tests 2 times daily. (Patient not taking: Reported on 04/01/2018) 100 each 6   hydrALAZINE (APRESOLINE) 50 MG tablet TAKE ONE TABLET BY MOUTH (50MG  TOTAL) BY MOUTH 2 (TWO) TIMES DAILY 180 tablet 0   Lancets (ONETOUCH ULTRASOFT) lancets Use 1 per day dx code 249.00 (Patient not taking: Reported on 04/01/2018) 100 each 12   lansoprazole (PREVACID) 15 MG capsule Take 15 mg by mouth daily at 12 noon.     levothyroxine (SYNTHROID, LEVOTHROID) 88 MCG tablet TAKE 1 TABLET BY MOUTH ONCE DAILY BEFORE BREAKFAST 90 tablet 0     meclizine (ANTIVERT) 25 MG tablet Take 1 tablet (25 mg total) by mouth 3 (three) times daily as needed for dizziness. 45 tablet 0   NON FORMULARY [facility]  Peripheral Neuropathy Cream- Bupivacaine 1%, Doxepin 3%, Gabapentin 6%, Pentoxifylline 3%, Topiramate 1% Apply 1-2 grams to affected area 3-4 times daily Qty. 120 gm 3 refills     ONETOUCH DELICA LANCETS 54Y MISC Use to check blood sugar once daily as instructed. Dx code: E11.9 (Patient not taking: Reported on 04/01/2018) 100 each 2   sertraline (ZOLOFT) 50 MG tablet TAKE 1&1/2 TABLETS BY MOUTH DAILY 135 tablet 0   No current facility-administered medications for this visit.     SURGICAL HISTORY:  Past Surgical History:  Procedure Laterality Date   ANTERIOR CERVICAL DECOMP/DISCECTOMY FUSION  2000   C5-C6   CARDIAC CATHETERIZATION     Hx: of 2/ 2015   CHOLECYSTECTOMY  1970's   CORONARY ANGIOGRAM     Hx: of 2/ 2015   DILATION AND CURETTAGE OF UTERUS  1960's   KNEE ARTHROSCOPY Bilateral    LEFT HEART CATHETERIZATION WITH CORONARY ANGIOGRAM N/A 12/13/2013   Procedure: LEFT HEART CATHETERIZATION WITH CORONARY ANGIOGRAM;  Surgeon: Burnell Blanks, MD;  Location[facility] CATH LAB;  Service: Cardiovascular;  Laterality: N/A;   LOBECTOMY Left 12/15/2013   Procedure: LOBECTOMY;  Surgeon: Grace Isaac, MD;  Location: [place];  Service: Thoracic;  Laterality: Left;   PERCUTANEOUS STENT INTERVENTION Right 11/08/2013   Procedure: PERCUTANEOUS STENT INTERVENTION;  Surgeon: Lorretta Harp, MD;  Location: [facility] CATH LAB;  Service: Cardiovascular;  Laterality: Right;  rt renal artery stent   RENAL ANGIOGRAM Bilateral 11/08/2013   Procedure: RENAL ANGIOGRAM;  Surgeon: Lorretta Harp, MD;  Location: [facility] CATH LAB;  Service: Cardiovascular;  Laterality: Bilateral;   RENAL ARTERY STENT Right 11/08/2013   Archie Endo 11/08/2013   SKIN CANCER EXCISION     TONSILLECTOMY AND ADENOIDECTOMY  ?[place] (VATS)/WEDGE RESECTION Left 12/15/2013   Procedure: VIDEO ASSISTED THORACOSCOPY (VATS)/WEDGE RESECTION;  Surgeon: Grace Isaac, MD;  Location: [place];  Service: Thoracic;  Laterality: Left;   VIDEO BRONCHOSCOPY N/A 12/15/2013   Procedure: VIDEO BRONCHOSCOPY;  Surgeon: Grace Isaac, MD;  Location: [place];  Service: Thoracic;  Laterality: N/A;    REVIEW OF SYSTEMS:  A comprehensive review of systems was negative.   PHYSICAL EXAMINATION: General appearance: alert, cooperative and no distress Head: Normocephalic, without obvious abnormality, atraumatic Neck: no adenopathy, no JVD, supple, symmetrical, trachea midline and thyroid not enlarged, symmetric, no tenderness/mass/nodules Lymph nodes: Cervical, supraclavicular, and axillary nodes normal. Resp: clear to auscultation bilaterally Back: symmetric, no curvature. ROM normal. No CVA tenderness. Cardio: regular rate and rhythm, S1, S2 normal, no murmur, click, rub or gallop GI: soft, non-tender; bowel sounds normal; no masses,  no organomegaly Extremities: extremities normal, atraumatic, no cyanosis or edema  ECOG PERFORMANCE STATUS: 1 - Symptomatic but completely ambulatory  Blood pressure (!) 143/63, pulse 81, temperature 97.9 F (36.6 C), temperature source Oral, resp. rate 20, height 5\' 4"  (1.626 m), weight 229 lb 3.2 oz (104 kg), SpO2 96 %.  LABORATORY DATA: Lab Results  Component Value Date   WBC 10.0 03/08/2019   HGB 11.6 (L) 03/08/2019   HCT 37.1 03/08/2019   MCV 84.7 03/08/2019   PLT 335 03/08/2019      Chemistry      Component Value Date/Time   NA 143 03/02/2018 0744   NA 144 03/05/2017 0744   K 4.1 03/02/2018 0744   K 4.7 03/05/2017 0744   CL 104 03/02/2018 0744   CO2 28 03/02/2018 0744   CO2 28 03/05/2017 0744   BUN 37 (H) 03/02/2018 0744   BUN 54.4 (H) 03/05/2017 0744   CREATININE 1.56 (H) 03/02/2018 0744   CREATININE 1.58 (H) 09/19/2017 1517   CREATININE 1.7 (H) 03/05/2017 0744       Component Value Date/Time   CALCIUM 9.9 03/02/2018 0744   CALCIUM 9.9 03/05/2017 0744   ALKPHOS 111 03/02/2018 0744   ALKPHOS 98 03/05/2017 0744   AST 15 03/02/2018 0744   AST 13 03/05/2017 0744   ALT 11 03/02/2018 0744   ALT 10 03/05/2017 0744   BILITOT 0.4 03/02/2018 0744   BILITOT 0.45 03/05/2017 0744       RADIOGRAPHIC STUDIES: No results found. ASSESSMENT AND PLAN:  This is a very pleasant 78 years old white female with a stage IA non-small cell lung cancer, adenocarcinoma diagnosed in January 2015 status post left lower lobectomy with lymph node dissection. The patient is currently on observation and she is feeling fine. She had repeat CT scan of the chest performed earlier today.  The final report is still pending but I personally reviewed the scan images and they did not see any concerning findings for disease recurrence or progression.  We will wait for the final report for confirmation. I recommended for the patient to continue on observation with repeat CT scan of the chest in 1 year. She was advised to call immediately if she has any concerning symptoms in the interval. The patient voices understanding of current disease status and treatment options and is in agreement with the current care plan. All questions were answered. The patient knows to call the clinic with any problems, questions or concerns. We can certainly see the patient much sooner if necessary. I spent 10 minutes counseling the patient face to face. The total time spent in the appointment was 15 minutes.  Disclaimer: This note was dictated with voice recognition software. Similar sounding words can inadvertently be transcribed and may not be corrected upon review.

## 2019-03-16 ENCOUNTER — Other Ambulatory Visit (HOSPITAL_COMMUNITY): Payer: Self-pay | Admitting: Cardiovascular Disease

## 2019-03-16 DIAGNOSIS — Z9889 Other specified postprocedural states: Secondary | ICD-10-CM

## 2019-03-16 DIAGNOSIS — I701 Atherosclerosis of renal artery: Secondary | ICD-10-CM

## 2019-06-08 ENCOUNTER — Telehealth: Payer: Self-pay | Admitting: Cardiovascular Disease

## 2019-06-08 NOTE — Telephone Encounter (Signed)
Spoke with the patient to set up a recall appointment, and she has moved to [place]. She does not wish to be seen at [facility] by Dr. Fletcher Anon any longer, as it is too far of a drive to [facility]

## 2019-09-03 ENCOUNTER — Other Ambulatory Visit: Payer: Self-pay | Admitting: Nephrology

## 2019-09-03 DIAGNOSIS — N183 Chronic kidney disease, stage 3 unspecified: Secondary | ICD-10-CM

## 2019-09-03 DIAGNOSIS — I701 Atherosclerosis of renal artery: Secondary | ICD-10-CM

## 2020-03-06 ENCOUNTER — Inpatient Hospital Stay: Payer: Medicare Other | Attending: Internal Medicine

## 2020-03-06 ENCOUNTER — Ambulatory Visit (HOSPITAL_COMMUNITY)
Admission: RE | Admit: 2020-03-06 | Discharge: 2020-03-06 | Disposition: A | Discharge status: Home / Self Care | Payer: Medicare Other | Source: Ambulatory Visit | Attending: Internal Medicine | Admitting: Internal Medicine

## 2020-03-06 ENCOUNTER — Other Ambulatory Visit: Payer: Self-pay

## 2020-03-06 DIAGNOSIS — M25511 Pain in right shoulder: Secondary | ICD-10-CM | POA: Insufficient documentation

## 2020-03-06 DIAGNOSIS — C3490 Malignant neoplasm of unspecified part of unspecified bronchus or lung: Secondary | ICD-10-CM | POA: Diagnosis not present

## 2020-03-06 DIAGNOSIS — Z85828 Personal history of other malignant neoplasm of skin: Secondary | ICD-10-CM | POA: Diagnosis not present

## 2020-03-06 DIAGNOSIS — J45909 Unspecified asthma, uncomplicated: Secondary | ICD-10-CM | POA: Diagnosis not present

## 2020-03-06 DIAGNOSIS — E039 Hypothyroidism, unspecified: Secondary | ICD-10-CM | POA: Insufficient documentation

## 2020-03-06 DIAGNOSIS — N183 Chronic kidney disease, stage 3 unspecified: Secondary | ICD-10-CM | POA: Insufficient documentation

## 2020-03-06 DIAGNOSIS — K219 Gastro-esophageal reflux disease without esophagitis: Secondary | ICD-10-CM | POA: Insufficient documentation

## 2020-03-06 DIAGNOSIS — E785 Hyperlipidemia, unspecified: Secondary | ICD-10-CM | POA: Diagnosis not present

## 2020-03-06 DIAGNOSIS — Z85118 Personal history of other malignant neoplasm of bronchus and lung: Secondary | ICD-10-CM | POA: Diagnosis present

## 2020-03-06 DIAGNOSIS — Z79899 Other long term (current) drug therapy: Secondary | ICD-10-CM | POA: Insufficient documentation

## 2020-03-06 DIAGNOSIS — I2510 Atherosclerotic heart disease of native coronary artery without angina pectoris: Secondary | ICD-10-CM | POA: Insufficient documentation

## 2020-03-06 DIAGNOSIS — I5032 Chronic diastolic (congestive) heart failure: Secondary | ICD-10-CM | POA: Diagnosis not present

## 2020-03-06 DIAGNOSIS — M1990 Unspecified osteoarthritis, unspecified site: Secondary | ICD-10-CM | POA: Insufficient documentation

## 2020-03-06 DIAGNOSIS — D631 Anemia in chronic kidney disease: Secondary | ICD-10-CM | POA: Diagnosis not present

## 2020-03-06 DIAGNOSIS — I130 Hypertensive heart and chronic kidney disease with heart failure and stage 1 through stage 4 chronic kidney disease, or unspecified chronic kidney disease: Secondary | ICD-10-CM | POA: Diagnosis not present

## 2020-03-06 LAB — CBC WITH DIFFERENTIAL (CANCER CENTER ONLY)
Abs Immature Granulocytes: 0.03 10*3/uL (ref 0.00–0.07)
Basophils Absolute: 0.1 10*3/uL (ref 0.0–0.1)
Basophils Relative: 1 %
Eosinophils Absolute: 0.2 10*3/uL (ref 0.0–0.5)
Eosinophils Relative: 2 %
HCT: 32.8 % — ABNORMAL LOW (ref 36.0–46.0)
Hemoglobin: 9.8 g/dL — ABNORMAL LOW (ref 12.0–15.0)
Immature Granulocytes: 0 %
Lymphocytes Relative: 17 %
Lymphs Abs: 1.4 10*3/uL (ref 0.7–4.0)
MCH: 24.4 pg — ABNORMAL LOW (ref 26.0–34.0)
MCHC: 29.9 g/dL — ABNORMAL LOW (ref 30.0–36.0)
MCV: 81.8 fL (ref 80.0–100.0)
Monocytes Absolute: 0.5 10*3/uL (ref 0.1–1.0)
Monocytes Relative: 6 %
Neutro Abs: 6.1 10*3/uL (ref 1.7–7.7)
Neutrophils Relative %: 74 %
Platelet Count: 341 10*3/uL (ref 150–400)
RBC: 4.01 MIL/uL (ref 3.87–5.11)
RDW: 17.8 % — ABNORMAL HIGH (ref 11.5–15.5)
WBC Count: 8.2 10*3/uL (ref 4.0–10.5)
nRBC: 0 % (ref 0.0–0.2)

## 2020-03-06 LAB — CMP (CANCER CENTER ONLY)
ALT: 10 U/L (ref 0–44)
AST: 15 U/L (ref 15–41)
Albumin: 3.5 g/dL (ref 3.5–5.0)
Alkaline Phosphatase: 92 U/L (ref 38–126)
Anion gap: 12 (ref 5–15)
BUN: 32 mg/dL — ABNORMAL HIGH (ref 8–23)
CO2: 30 mmol/L (ref 22–32)
Calcium: 9.5 mg/dL (ref 8.9–10.3)
Chloride: 106 mmol/L (ref 98–111)
Creatinine: 1.23 mg/dL — ABNORMAL HIGH (ref 0.44–1.00)
GFR, Est AFR Am: 48 mL/min — ABNORMAL LOW (ref >60)
GFR, Estimated: 41 mL/min — ABNORMAL LOW (ref >60)
Glucose, Bld: 101 mg/dL — ABNORMAL HIGH (ref 70–99)
Potassium: 3.6 mmol/L (ref 3.5–5.1)
Sodium: 148 mmol/L — ABNORMAL HIGH (ref 135–145)
Total Bilirubin: 0.3 mg/dL (ref 0.3–1.2)
Total Protein: 7 g/dL (ref 6.5–8.1)

## 2020-03-08 ENCOUNTER — Other Ambulatory Visit: Payer: Self-pay

## 2020-03-08 ENCOUNTER — Encounter: Payer: Self-pay | Admitting: Internal Medicine

## 2020-03-08 ENCOUNTER — Inpatient Hospital Stay (HOSPITAL_BASED_OUTPATIENT_CLINIC_OR_DEPARTMENT_OTHER): Payer: Medicare Other | Admitting: Internal Medicine

## 2020-03-08 VITALS — BP 174/61 | HR 77 | Temp 98.3°F | Resp 18 | Ht 64.0 in | Wt 231.6 lb

## 2020-03-08 DIAGNOSIS — Z85118 Personal history of other malignant neoplasm of bronchus and lung: Secondary | ICD-10-CM | POA: Diagnosis not present

## 2020-03-08 DIAGNOSIS — C3430 Malignant neoplasm of lower lobe, unspecified bronchus or lung: Secondary | ICD-10-CM

## 2020-03-08 NOTE — Progress Notes (Signed)
[place] [phone]   [phone]  OFFICE PROGRESS NOTE  [name], MD [address] [place] [address]  DIAGNOSIS: Stage IA (T1a, N0, M0) non-small cell lung cancer, adenocarcinoma diagnosed in January of 2015.  PRIOR THERAPY: Bronchoscopy, left video-assisted thoracoscopy, with wedge resection for biopsy of left lower lobe lesion, completion left lower lobectomy, lymph node dissection under the care of Dr. Servando Snare  CURRENT THERAPY: Observation.  INTERVAL HISTORY: Susan Pittman 79 y.o. female returns to the clinic today for annual follow-up visit accompanied by her husband. The patient is feeling fine today with no concerning complaints except for arthralgia of her right shoulder because of frequent use of her cane. The patient denied having any chest pain, shortness of breath, cough or hemoptysis. She denied having any fever or chills. She has no nausea, vomiting, diarrhea or constipation. She has no headache or visual changes. She had repeat CT scan of the chest performed recently and the patient is here today for evaluation and discussion of her risk her results.   MEDICAL HISTORY: Past Medical History:  Diagnosis Date  . Allergy   . Arthritis    "knees, hips, back, hands" (11/08/2013)  . Asthma   . Basal cell carcinoma    "cut them off face & right shoulder" (11/08/2013)  . CAD (coronary artery disease), non obstructive by cardiac cath 12/12/13 01/24/2014  . CKD (chronic kidney disease), stage III   . Diastolic CHF ([facility])   . DJD (degenerative joint disease)   . Gastric ulcer   . GERD (gastroesophageal reflux disease)   . Hiatal hernia   . History of steroid induced diabetes   . Hyperlipidemia    "don't take RX for it" (11/08/2013)  . Hypertension   . Hypothyroid   . Lung cancer ([facility]) dx'd 11/2013  . Lung mass    superior segment LLL/notes 11/08/2013  . Lung nodule/ adenoca > LLobectomy 12/15/13  11/08/2013   PET  11/23/2013 1. Dominant sub solid left lower lobe nodule does not demonstrate significantly increased metabolic activity. However, morphologically, adenocarcinoma is still a concern.   - Spirometry 11/24/13 wnl  - 11/24/2013 T surg eval rec> LLower lobectomy 12/15/2013 for adenoca   . Nephropathy   . Osteoporosis   . Pneumonia    "I've had it ?3 times" (11/08/2013)  . Renal artery stenosis ([facility])    with stent placement    ALLERGIES:  is allergic to benazepril hcl; loratadine; allegra [fexofenadine hcl]; celecoxib; fexofenadine; lisinopril; sulfa antibiotics; and sulfur.  MEDICATIONS:  Current Outpatient Medications  Medication Sig Dispense Refill  . acetaminophen (TYLENOL) 500 MG tablet Take 500 mg by mouth 3 (three) times daily.    Marland Kitchen albuterol (PROVENTIL HFA;VENTOLIN HFA) 108 (90 BASE) MCG/ACT inhaler Inhale 1-2 puffs into the lungs every 6 (six) hours as needed for wheezing or shortness of breath. 18 g 1  . allopurinol (ZYLOPRIM) 100 MG tablet TAKE 2 TABLETS BY MOUTH DAILY IN THE MORNING 60 tablet 3  . Calcium Carb-Cholecalciferol 600-800 MG-UNIT TABS Take 2 tablets by mouth daily.     . carvedilol (COREG) 6.25 MG tablet Take 1 tablet (6.25 mg total) 2 (two) times daily by mouth. 180 tablet 3  . cetirizine (ZYRTEC) 10 MG tablet Take 10 mg by mouth at bedtime.     . fluticasone (FLONASE) 50 MCG/ACT nasal spray USE 2 SPRAYS IN EACH NOSTRIL ONCE A DAY 16 g 1  . furosemide (LASIX)  40 MG tablet TAKE ONE TABLET BY MOUTH TWICE DAILY 180 tablet 0  . hydrALAZINE (APRESOLINE) 50 MG tablet TAKE ONE TABLET BY MOUTH (50MG  TOTAL) BY MOUTH 2 (TWO) TIMES DAILY 180 tablet 0  . lansoprazole (PREVACID) 15 MG capsule Take 15 mg by mouth daily at 12 noon.    Marland Kitchen levothyroxine (SYNTHROID, LEVOTHROID) 88 MCG tablet TAKE 1 TABLET BY MOUTH ONCE DAILY BEFORE BREAKFAST 90 tablet 0  . sertraline (ZOLOFT) 50 MG tablet TAKE 1&1/2 TABLETS BY MOUTH DAILY 135 tablet 0  . glucose blood (ONE TOUCH ULTRA TEST) test strip Dx E09.9.  Please use one strip each time sugars are checked. Pt tests 2 times daily. (Patient not taking: Reported on 04/01/2018) 100 each 6  . Lancets (ONETOUCH ULTRASOFT) lancets Use 1 per day dx code 249.00 (Patient not taking: Reported on 04/01/2018) 100 each 12  . meclizine (ANTIVERT) 25 MG tablet Take 1 tablet (25 mg total) by mouth 3 (three) times daily as needed for dizziness. 45 tablet 0  . NON FORMULARY [facility]  Peripheral Neuropathy Cream- Bupivacaine 1%, Doxepin 3%, Gabapentin 6%, Pentoxifylline 3%, Topiramate 1% Apply 1-2 grams to affected area 3-4 times daily Qty. 120 gm 3 refills    . ONETOUCH DELICA LANCETS 10X MISC Use to check blood sugar once daily as instructed. Dx code: E11.9 (Patient not taking: Reported on 04/01/2018) 100 each 2   No current facility-administered medications for this visit.    SURGICAL HISTORY:  Past Surgical History:  Procedure Laterality Date  . ANTERIOR CERVICAL DECOMP/DISCECTOMY FUSION  2000   C5-C6  . CARDIAC CATHETERIZATION     Hx: of 2/ 2015  . CHOLECYSTECTOMY  1970's  . CORONARY ANGIOGRAM     Hx: of 2/ 2015  . DILATION AND CURETTAGE OF UTERUS  1960's  . KNEE ARTHROSCOPY Bilateral   . LEFT HEART CATHETERIZATION WITH CORONARY ANGIOGRAM N/A 12/13/2013   Procedure: LEFT HEART CATHETERIZATION WITH CORONARY ANGIOGRAM;  Surgeon: Burnell Blanks, MD;  Location[facility] C[facility] LAB;  Service: Cardiovascular;  Laterality: N/A;  . LOBECTOMY Left 12/15/2013   Procedure: LOBECTOMY;  Surgeon: Grace Isaac, MD;  Location: [place];  Service: Thoracic;  Laterality: Left;  . PERCUTANEOUS STENT INTERVENTION Right 11/08/2013   Procedure: PERCUTANEOUS STENT INTERVENTION;  Surgeon: Lorretta Harp, MD;  Location: [facility] CATH LAB;  Service: Cardiovascular;  Laterality: Right;  rt renal artery stent  . RENAL ANGIOGRAM Bilateral 11/08/2013   Procedure: RENAL ANGIOGRAM;  Surgeon: Lorretta Harp, MD;  Location: [facility] CATH LAB;  Service: Cardiovascular;  Laterality:  Bilateral;  . RENAL ARTERY STENT Right 11/08/2013   Archie Endo 11/08/2013  . SKIN CANCER EXCISION    . TONSILLECTOMY AND ADENOIDECTOMY  ?1946  . VAGINAL HYSTERECTOMY  1970  . VIDEO ASSISTED THORACOSCOPY (VATS)/WEDGE RESECTION Left 12/15/2013   Procedure: VIDEO ASSISTED THORACOSCOPY (VATS)/WEDGE RESECTION;  Surgeon: Grace Isaac, MD;  Location: [place];  Service: Thoracic;  Laterality: Left;  Marland Kitchen VIDEO BRONCHOSCOPY N/A 12/15/2013   Procedure: VIDEO BRONCHOSCOPY;  Surgeon: Grace Isaac, MD;  Location: [facility] OR;  Service: Thoracic;  Laterality: N/A;    REVIEW OF SYSTEMS:  A comprehensive review of systems was negative except for: Constitutional: positive for fatigue Musculoskeletal: positive for arthralgias   PHYSICAL EXAMINATION: General appearance: alert, cooperative and no distress Head: Normocephalic, without obvious abnormality, atraumatic Neck: no adenopathy, no JVD, supple, symmetrical, trachea midline and thyroid not enlarged, symmetric, no tenderness/mass/nodules Lymph nodes: Cervical, supraclavicular, and axillary nodes normal. Resp: clear to auscultation  bilaterally Back: symmetric, no curvature. ROM normal. No CVA tenderness. Cardio: regular rate and rhythm, S1, S2 normal, no murmur, click, rub or gallop GI: soft, non-tender; bowel sounds normal; no masses,  no organomegaly Extremities: extremities normal, atraumatic, no cyanosis or edema  ECOG PERFORMANCE STATUS: 1 - Symptomatic but completely ambulatory  Blood pressure (!) 174/61, pulse 77, temperature 98.3 F (36.8 C), temperature source Oral, resp. rate 18, height 5\' 4"  (1.626 m), weight 231 lb 9.6 oz (105.1 kg), SpO2 100 %.  LABORATORY DATA: Lab Results  Component Value Date   WBC 8.2 03/06/2020   HGB 9.8 (L) 03/06/2020   HCT 32.8 (L) 03/06/2020   MCV 81.8 03/06/2020   PLT 341 03/06/2020      Chemistry      Component Value Date/Time   NA 148 (H) 03/06/2020 0945   NA 144 03/05/2017 0744   K 3.6 03/06/2020 0945   K  4.7 03/05/2017 0744   CL 106 03/06/2020 0945   CO2 30 03/06/2020 0945   CO2 28 03/05/2017 0744   BUN 32 (H) 03/06/2020 0945   BUN 54.4 (H) 03/05/2017 0744   CREATININE 1.23 (H) 03/06/2020 0945   CREATININE 1.58 (H) 09/19/2017 1517   CREATININE 1.7 (H) 03/05/2017 0744      Component Value Date/Time   CALCIUM 9.5 03/06/2020 0945   CALCIUM 9.9 03/05/2017 0744   ALKPHOS 92 03/06/2020 0945   ALKPHOS 98 03/05/2017 0744   AST 15 03/06/2020 0945   AST 13 03/05/2017 0744   ALT 10 03/06/2020 0945   ALT 10 03/05/2017 0744   BILITOT 0.3 03/06/2020 0945   BILITOT 0.45 03/05/2017 0744       RADIOGRAPHIC STUDIES: CT Chest Wo Contrast  Result Date: 03/06/2020 CLINICAL DATA:  Non-small cell lung cancer. EXAM: CT CHEST WITHOUT CONTRAST TECHNIQUE: Multidetector CT imaging of the chest was performed following the standard protocol without IV contrast. COMPARISON:  03/08/2019 FINDINGS: Cardiovascular: No significant vascular findings. Normal heart size. No pericardial effusion. Mediastinum/Nodes: No axillary supraclavicular adenopathy. No mediastinal Lungs/Pleura: ground-glass nodule in the superior aspect of the RIGHT lower lobe measuring 10 mm and not changed from prior (image 59/7). Less well-defined ground-glass nodule in the RIGHT middle lobe on image 65/7 is also unchanged. Post LEFT lower lobe resection. Within the inferior LEFT lung there is increase in bands of linear thickening. No nodularity. Upper Abdomen: Limited view of the liver, kidneys, pancreas are unremarkable. Normal adrenal glands. Musculoskeletal: No aggressive osseous lesion. IMPRESSION: 1. Stable ground-glass nodules in the RIGHT lung. 2. Volume loss in the LEFT hemithorax consistent with LEFT lower lobectomy. There is new bands of linear thickening in the lower lung which appear benign. 3. No lymphadenopathy. 4. No evidence of lung cancer. Electronically Signed   By: Suzy Bouchard M.D.   On: 03/06/2020 12:25   ASSESSMENT AND PLAN:   This is a very pleasant 79 years old white female with a stage IA non-small cell lung cancer, adenocarcinoma diagnosed in January 2015 status post left lower lobectomy with lymph node dissection. The patient has no complaints today except for arthralgia. She had repeat CT scan of the chest performed recently. I personally and independently reviewed the scans and discussed the results with the patient and her husband. Her scan showed no concerning findings for disease recurrence. I recommended for the patient to continue on observation. She moved to [facility] which is around 2 hours away and the patient was wondering if she can follow up with her  primary care physician from now on. I agree with her request and she will need imaging studies on annual basis and will be happy to see her in the future if she has any concerning findings. For the anemia of chronic disease and renal insufficiency, she will need to reestablish with a nephrologist after her previous nephrologist retired recently. The patient was advised to call immediately if she has any concerning symptoms in the interval. The patient voices understanding of current disease status and treatment options and is in agreement with the current care plan. All questions were answered. The patient knows to call the clinic with any problems, questions or concerns. We can certainly see the patient much sooner if necessary.  Disclaimer: This note was dictated with voice recognition software. Similar sounding words can inadvertently be transcribed and may not be corrected upon review.
